# Patient Record
Sex: Male | Born: 1965 | Race: White | Hispanic: No | Smoking: Current every day smoker
Health system: Southern US, Community
[De-identification: ages and names within clinical notes are randomized; demographics above are authoritative.]

## PROBLEM LIST (undated history)

## (undated) ENCOUNTER — Emergency Department (HOSPITAL_COMMUNITY): Admission: EM

## (undated) DIAGNOSIS — K5900 Constipation, unspecified: Secondary | ICD-10-CM

## (undated) DIAGNOSIS — K759 Inflammatory liver disease, unspecified: Secondary | ICD-10-CM

## (undated) DIAGNOSIS — F1010 Alcohol abuse, uncomplicated: Secondary | ICD-10-CM

## (undated) DIAGNOSIS — K7460 Unspecified cirrhosis of liver: Secondary | ICD-10-CM

## (undated) DIAGNOSIS — F17200 Nicotine dependence, unspecified, uncomplicated: Secondary | ICD-10-CM

## (undated) DIAGNOSIS — K219 Gastro-esophageal reflux disease without esophagitis: Secondary | ICD-10-CM

## (undated) DIAGNOSIS — I10 Essential (primary) hypertension: Secondary | ICD-10-CM

## (undated) DIAGNOSIS — F1011 Alcohol abuse, in remission: Secondary | ICD-10-CM

## (undated) DIAGNOSIS — D649 Anemia, unspecified: Secondary | ICD-10-CM

## (undated) DIAGNOSIS — M009 Pyogenic arthritis, unspecified: Secondary | ICD-10-CM

## (undated) HISTORY — PX: CHOLECYSTECTOMY: SHX55

## (undated) HISTORY — PX: IR TIPS: IMG2295

---

## 2015-01-25 ENCOUNTER — Emergency Department (HOSPITAL_COMMUNITY)
Admission: EM | Admit: 2015-01-25 | Discharge: 2015-01-25 | Disposition: A | Discharge status: Home / Self Care | Payer: Medicaid Other | Attending: Emergency Medicine | Admitting: Emergency Medicine

## 2015-01-25 ENCOUNTER — Encounter (HOSPITAL_COMMUNITY): Payer: Self-pay

## 2015-01-25 ENCOUNTER — Emergency Department (HOSPITAL_COMMUNITY): Payer: Medicaid Other

## 2015-01-25 DIAGNOSIS — K7460 Unspecified cirrhosis of liver: Secondary | ICD-10-CM | POA: Diagnosis not present

## 2015-01-25 DIAGNOSIS — R7989 Other specified abnormal findings of blood chemistry: Secondary | ICD-10-CM | POA: Diagnosis not present

## 2015-01-25 DIAGNOSIS — F17200 Nicotine dependence, unspecified, uncomplicated: Secondary | ICD-10-CM | POA: Diagnosis not present

## 2015-01-25 DIAGNOSIS — R945 Abnormal results of liver function studies: Secondary | ICD-10-CM

## 2015-01-25 DIAGNOSIS — R109 Unspecified abdominal pain: Secondary | ICD-10-CM | POA: Diagnosis present

## 2015-01-25 LAB — URINALYSIS, ROUTINE W REFLEX MICROSCOPIC
BILIRUBIN URINE: NEGATIVE
Glucose, UA: NEGATIVE mg/dL
Ketones, ur: NEGATIVE mg/dL
Leukocytes, UA: NEGATIVE
Nitrite: NEGATIVE
PH: 6 (ref 5.0–8.0)
Protein, ur: NEGATIVE mg/dL
SPECIFIC GRAVITY, URINE: 1.022 (ref 1.005–1.030)

## 2015-01-25 LAB — COMPREHENSIVE METABOLIC PANEL
ALT: 158 U/L — AB (ref 17–63)
ANION GAP: 6 (ref 5–15)
AST: 176 U/L — AB (ref 15–41)
Albumin: 3.1 g/dL — ABNORMAL LOW (ref 3.5–5.0)
Alkaline Phosphatase: 166 U/L — ABNORMAL HIGH (ref 38–126)
BUN: 11 mg/dL (ref 6–20)
CALCIUM: 9 mg/dL (ref 8.9–10.3)
CHLORIDE: 105 mmol/L (ref 101–111)
CO2: 23 mmol/L (ref 22–32)
Creatinine, Ser: 0.84 mg/dL (ref 0.61–1.24)
GFR calc non Af Amer: >60 mL/min (ref >60)
GLUCOSE: 161 mg/dL — AB (ref 65–99)
POTASSIUM: 3.9 mmol/L (ref 3.5–5.1)
Sodium: 134 mmol/L — ABNORMAL LOW (ref 135–145)
Total Bilirubin: 1.9 mg/dL — ABNORMAL HIGH (ref 0.3–1.2)
Total Protein: 6.7 g/dL (ref 6.5–8.1)

## 2015-01-25 LAB — CBC
HEMATOCRIT: 40.9 % (ref 39.0–52.0)
HEMOGLOBIN: 14.5 g/dL (ref 13.0–17.0)
MCH: 34.1 pg — AB (ref 26.0–34.0)
MCHC: 35.5 g/dL (ref 30.0–36.0)
MCV: 96.2 fL (ref 78.0–100.0)
Platelets: 90 10*3/uL — ABNORMAL LOW (ref 150–400)
RBC: 4.25 MIL/uL (ref 4.22–5.81)
RDW: 14.4 % (ref 11.5–15.5)
WBC: 6.8 10*3/uL (ref 4.0–10.5)

## 2015-01-25 LAB — URINE MICROSCOPIC-ADD ON: WBC, UA: NONE SEEN WBC/hpf (ref 0–5)

## 2015-01-25 LAB — LIPASE, BLOOD: Lipase: 109 U/L — ABNORMAL HIGH (ref 11–51)

## 2015-01-25 MED ORDER — MORPHINE SULFATE (PF) 4 MG/ML IV SOLN
8.0000 mg | INTRAVENOUS | Status: DC | PRN
  Administered 2015-01-25 (×2): 8 mg via INTRAVENOUS
  Filled 2015-01-25 (×2): qty 2

## 2015-01-25 MED ORDER — ONDANSETRON HCL 4 MG/2ML IJ SOLN
4.0000 mg | Freq: Once | INTRAMUSCULAR | Status: AC
  Administered 2015-01-25: 4 mg via INTRAVENOUS
  Filled 2015-01-25: qty 2

## 2015-01-25 MED ORDER — IOHEXOL 300 MG/ML  SOLN
100.0000 mL | Freq: Once | INTRAMUSCULAR | Status: AC | PRN
  Administered 2015-01-25: 100 mL via INTRAVENOUS

## 2015-01-25 NOTE — ED Notes (Signed)
Pt ambulating independently w/ steady gait on d/c in no acute distress, A&Ox4.D/c instructions reviewed w/ pt and family - pt and family deny any further questions or concerns at present.  

## 2015-01-25 NOTE — ED Provider Notes (Signed)
CSN: [id number]     Arrival date & time 01/25/15  0156 History   By signing my name below, I, Jonathan Ibarra, attest that this documentation has been prepared under the direction and in the presence of Azalia Bilis, MD. Electronically Signed: Evon Ibarra, ED Scribe. 01/25/2015. 2:55 AM.    Chief Complaint  Patient presents with  . Abdominal Pain   The history is provided by the patient. No language interpreter was used.   HPI Comments: Jonathan Ibarra is a 49 y.o. male who presents to the Emergency Department complaining of intermittent  right side abdominal pain for the past 1 year. Pt states that over the last 3 days his pain has recently worsened. Pt states that the pain is radiating around to his right back. Pt states that he has had intermittent blood in his stool he states that when he wipe the tissue would be covered in blood. Pt states that he has been prescribed gabapentin for the pain but states that he has not been complaint with taking the medications for the past 2 days. Pt denies any other treatments tried for his symptoms. Pt states that he has an appointment at [facility] with a general surgeon next week. Pt denies dysuria, fever. Pt states that he has a Hx of  Abdominal hernias, cirrhosis of the liver, paracentesis and TIPS stent placed august 2016      Past Medical History  Diagnosis Date  . Cirrhosis (HCC)    No past surgical history on file. No family history on file. Social History  Substance Use Topics  . Smoking status: Current Every Day Smoker  . Smokeless tobacco: Not on file  . Alcohol Use: Yes    Review of Systems  Constitutional: Negative for fever.  Gastrointestinal: Positive for abdominal pain.  Genitourinary: Negative for dysuria.  All other systems reviewed and are negative.    Allergies  Review of patient's allergies indicates not on file.  Home Medications   Prior to Admission medications   Not on File   BP 136/72 mmHg  Pulse 106   Temp(Src) 97.9 F (36.6 C) (Oral)  Resp 22  Ht  (1.905 m)  Wt 244 lb (110.678 kg)  BMI 30.50 kg/m2  SpO2 98%   Physical Exam  Constitutional: He is oriented to person, place, and time. He appears well-developed and well-nourished.  HENT:  Head: Normocephalic and atraumatic.  Eyes: EOM are normal.  Neck: Normal range of motion.  Cardiovascular: Normal rate, regular rhythm, normal heart sounds and intact distal pulses.   Pulmonary/Chest: Effort normal and breath sounds normal. No respiratory distress.  Abdominal: Soft. He exhibits no distension. There is tenderness.  easily reducible umbilical hernia, mild right sided tenderness. Ascites present but not tight at this time  Musculoskeletal: Normal range of motion.  Neurological: He is alert and oriented to person, place, and time.  Skin: Skin is warm and dry.  Psychiatric: He has a normal mood and affect. Judgment normal.  Nursing note and vitals reviewed.   ED Course  Procedures (including critical care time) DIAGNOSTIC STUDIES: Oxygen Saturation is 98% on RA, normal by my interpretation.    COORDINATION OF CARE: 3:30 AM-Discussed treatment plan with pt at bedside and pt agreed to plan.     Labs Review Labs Reviewed  LIPASE, BLOOD - Abnormal; Notable for the following:    Lipase 109 (*)    All other components within normal limits  COMPREHENSIVE METABOLIC PANEL - Abnormal; Notable for the following:  Sodium 134 (*)    Glucose, Bld 161 (*)    Albumin 3.1 (*)    AST 176 (*)    ALT 158 (*)    Alkaline Phosphatase 166 (*)    Total Bilirubin 1.9 (*)    All other components within normal limits  CBC - Abnormal; Notable for the following:    MCH 34.1 (*)    Platelets 90 (*)    All other components within normal limits  URINALYSIS, ROUTINE W REFLEX MICROSCOPIC (NOT AT [facility]) - Abnormal; Notable for the following:    Color, Urine AMBER (*)    Hgb urine dipstick SMALL (*)    All other components within normal limits   URINE MICROSCOPIC-ADD ON - Abnormal; Notable for the following:    Squamous Epithelial / LPF 0-5 (*)    Bacteria, UA RARE (*)    All other components within normal limits    Imaging Review Ct Abdomen Pelvis W Contrast  01/25/2015  CLINICAL DATA:  Acute onset of mid to right lower quadrant abdominal pain. Initial encounter. EXAM: CT ABDOMEN AND PELVIS WITH CONTRAST TECHNIQUE: Multidetector CT imaging of the abdomen and pelvis was performed using the standard protocol following bolus administration of intravenous contrast. CONTRAST:  OMNIPAQUE IOHEXOL 300 MG/ML  SOLN COMPARISON:  CT of the abdomen and pelvis performed 06/29/2014 FINDINGS: A small right pleural effusion is noted, with mild bibasilar atelectasis. There is a diffusely nodular contour to the liver, compatible with hepatic cirrhosis. The spleen is enlarged, measuring 19.4 cm in length. The TIPS appears grossly patent, though difficult to fully assess given the phase of contrast enhancement. Mild gastric varices are noted. The patient is status post cholecystectomy, with clips noted at the gallbladder fossa. Trace ascites is noted about the liver. The pancreas and adrenal glands are unremarkable. The kidneys are unremarkable in appearance. There is no evidence of hydronephrosis. No renal or ureteral stones are seen. No perinephric stranding is appreciated. No free fluid is identified. The small bowel is unremarkable in appearance. The stomach is within normal limits. No acute vascular abnormalities are seen. The appendix is normal in caliber and contains air, without evidence of appendicitis. The colon is unremarkable in appearance. Mild nonspecific soft tissue inflammation is noted adjacent to the cecum, possibly reflecting a small omental infarct. A small umbilical hernia is seen, containing only fat, with associated soft tissue inflammation. The bladder is mildly distended and grossly unremarkable. A small urachal remnant is incidentally  seen. The prostate remains normal in size. No inguinal lymphadenopathy is seen. No acute osseous abnormalities are identified. IMPRESSION: 1. Mild nonspecific soft tissue inflammation adjacent to the cecum, possibly reflecting a small omental infarct. 2. Small umbilical hernia, containing only fat, with associated soft tissue inflammation. 3. Findings of hepatic cirrhosis. A TIPS appears grossly patent, though difficult to fully assess given the phase of contrast enhancement. Mild gastric varices noted. 4. Splenomegaly noted. 5. Small right pleural effusion, with mild bibasilar atelectasis. 6. Trace ascites noted about the liver. Electronically Signed   By: Roanna Raider M.D.   On: 01/25/2015 05:35  I personally reviewed the imaging tests through PACS system I reviewed available ER/hospitalization records through the EMR     EKG Interpretation None      MDM   Final diagnoses:  None    Baseline liver function tests for the patient as compared to the [facility] labs reviewed in the electronic medical record.  CT scan without acute pathology.  Pain improving.  Long-standing history of recurrent abdominal pain.  Patient is seen in multiple different hospital systems.  Patient be referred back to his primary team at [facility].  Patient be discharged home at this time.  The patient will not be sent home with any of your medications from the emergency department.  Of told the patient that these medications are best delivered through a primary care physician   I personally performed the services described in this documentation, which was scribed in my presence. The recorded information has been reviewed and is accurate.         Azalia BilisKevin Travia Onstad, MD 01/25/15 [phone]

## 2015-01-25 NOTE — ED Notes (Signed)
Patient presents with c/o abd pain for the past 3 days.  Abd swollen - states he has 3 hernias and [facility].  No vomiting at this time

## 2015-01-25 NOTE — ED Notes (Signed)
Patient c/o nausea 

## 2015-01-25 NOTE — ED Notes (Signed)
Sclera not yellow but skin has a yellow tinge.

## 2015-01-25 NOTE — ED Notes (Signed)
Pt here for abd pain, hx of same, has appt with surgeon and liver doctor at [facility] this coming week

## 2015-02-04 ENCOUNTER — Emergency Department (HOSPITAL_COMMUNITY): Payer: Medicaid Other

## 2015-02-04 ENCOUNTER — Encounter (HOSPITAL_COMMUNITY): Payer: Self-pay | Admitting: Emergency Medicine

## 2015-02-04 ENCOUNTER — Inpatient Hospital Stay (HOSPITAL_COMMUNITY)
Admission: EM | Admit: 2015-02-04 | Discharge: 2015-02-07 | DRG: 443 | Disposition: A | Discharge status: Home / Self Care | Payer: Medicaid Other | Attending: Internal Medicine | Admitting: Internal Medicine

## 2015-02-04 DIAGNOSIS — B1920 Unspecified viral hepatitis C without hepatic coma: Secondary | ICD-10-CM | POA: Diagnosis present

## 2015-02-04 DIAGNOSIS — Z9114 Patient's other noncompliance with medication regimen: Secondary | ICD-10-CM

## 2015-02-04 DIAGNOSIS — D696 Thrombocytopenia, unspecified: Secondary | ICD-10-CM | POA: Diagnosis present

## 2015-02-04 DIAGNOSIS — K7290 Hepatic failure, unspecified without coma: Secondary | ICD-10-CM

## 2015-02-04 DIAGNOSIS — K7210 Chronic hepatic failure without coma: Secondary | ICD-10-CM | POA: Diagnosis not present

## 2015-02-04 DIAGNOSIS — K5900 Constipation, unspecified: Secondary | ICD-10-CM | POA: Diagnosis present

## 2015-02-04 DIAGNOSIS — R4182 Altered mental status, unspecified: Secondary | ICD-10-CM | POA: Diagnosis not present

## 2015-02-04 DIAGNOSIS — F1290 Cannabis use, unspecified, uncomplicated: Secondary | ICD-10-CM | POA: Diagnosis present

## 2015-02-04 DIAGNOSIS — K429 Umbilical hernia without obstruction or gangrene: Secondary | ICD-10-CM | POA: Diagnosis present

## 2015-02-04 DIAGNOSIS — K7460 Unspecified cirrhosis of liver: Secondary | ICD-10-CM | POA: Diagnosis present

## 2015-02-04 DIAGNOSIS — R740 Nonspecific elevation of levels of transaminase and lactic acid dehydrogenase [LDH]: Secondary | ICD-10-CM | POA: Diagnosis present

## 2015-02-04 DIAGNOSIS — I10 Essential (primary) hypertension: Secondary | ICD-10-CM | POA: Diagnosis present

## 2015-02-04 DIAGNOSIS — E876 Hypokalemia: Secondary | ICD-10-CM | POA: Diagnosis present

## 2015-02-04 DIAGNOSIS — Z79899 Other long term (current) drug therapy: Secondary | ICD-10-CM

## 2015-02-04 DIAGNOSIS — F17200 Nicotine dependence, unspecified, uncomplicated: Secondary | ICD-10-CM | POA: Diagnosis present

## 2015-02-04 DIAGNOSIS — Z95828 Presence of other vascular implants and grafts: Secondary | ICD-10-CM

## 2015-02-04 DIAGNOSIS — K7682 Hepatic encephalopathy: Secondary | ICD-10-CM | POA: Diagnosis present

## 2015-02-04 DIAGNOSIS — F1910 Other psychoactive substance abuse, uncomplicated: Secondary | ICD-10-CM | POA: Diagnosis present

## 2015-02-04 DIAGNOSIS — R7401 Elevation of levels of liver transaminase levels: Secondary | ICD-10-CM | POA: Diagnosis present

## 2015-02-04 HISTORY — DX: Alcohol abuse, uncomplicated: F10.10

## 2015-02-04 HISTORY — DX: Unspecified cirrhosis of liver: K74.60

## 2015-02-04 HISTORY — DX: Essential (primary) hypertension: I10

## 2015-02-04 LAB — CBC WITH DIFFERENTIAL/PLATELET
BASOS PCT: 1 %
Basophils Absolute: 0 10*3/uL (ref 0.0–0.1)
Basophils Absolute: 0 10*3/uL (ref 0.0–0.1)
Basophils Relative: 1 %
EOS ABS: 0.1 10*3/uL (ref 0.0–0.7)
EOS ABS: 0.1 10*3/uL (ref 0.0–0.7)
EOS PCT: 3 %
Eosinophils Relative: 2 %
HCT: 39.2 % (ref 39.0–52.0)
HEMATOCRIT: 41.5 % (ref 39.0–52.0)
Hemoglobin: 14.2 g/dL (ref 13.0–17.0)
Hemoglobin: 13.5 g/dL (ref 13.0–17.0)
LYMPHS ABS: 0.7 10*3/uL (ref 0.7–4.0)
LYMPHS ABS: 0.7 10*3/uL (ref 0.7–4.0)
Lymphocytes Relative: 15 %
Lymphocytes Relative: 15 %
MCH: 33.3 pg (ref 26.0–34.0)
MCH: 33.8 pg (ref 26.0–34.0)
MCHC: 34.2 g/dL (ref 30.0–36.0)
MCHC: 34.4 g/dL (ref 30.0–36.0)
MCV: 97.2 fL (ref 78.0–100.0)
MCV: 98 fL (ref 78.0–100.0)
MONO ABS: 0.5 10*3/uL (ref 0.1–1.0)
MONO ABS: 0.7 10*3/uL (ref 0.1–1.0)
MONOS PCT: 11 %
Monocytes Relative: 15 %
Neutro Abs: 3.3 10*3/uL (ref 1.7–7.7)
Neutro Abs: 3.2 10*3/uL (ref 1.7–7.7)
Neutrophils Relative %: 72 %
Neutrophils Relative %: 68 %
PLATELETS: 87 10*3/uL — AB (ref 150–400)
Platelets: 88 10*3/uL — ABNORMAL LOW (ref 150–400)
RBC: 4.27 MIL/uL (ref 4.22–5.81)
RBC: 4 MIL/uL — AB (ref 4.22–5.81)
RDW: 14.5 % (ref 11.5–15.5)
RDW: 14.4 % (ref 11.5–15.5)
WBC: 4.5 10*3/uL (ref 4.0–10.5)
WBC: 4.8 10*3/uL (ref 4.0–10.5)

## 2015-02-04 LAB — APTT
APTT: 31 s (ref 24–37)
APTT: 33 s (ref 24–37)

## 2015-02-04 LAB — COMPREHENSIVE METABOLIC PANEL
ALBUMIN: 3.1 g/dL — AB (ref 3.5–5.0)
ALK PHOS: 197 U/L — AB (ref 38–126)
ALK PHOS: 181 U/L — AB (ref 38–126)
ALT: 205 U/L — AB (ref 17–63)
ALT: 199 U/L — AB (ref 17–63)
ANION GAP: 5 (ref 5–15)
AST: 200 U/L — AB (ref 15–41)
AST: 198 U/L — ABNORMAL HIGH (ref 15–41)
Albumin: 3 g/dL — ABNORMAL LOW (ref 3.5–5.0)
Anion gap: 7 (ref 5–15)
BILIRUBIN TOTAL: 1.8 mg/dL — AB (ref 0.3–1.2)
BUN: 11 mg/dL (ref 6–20)
BUN: 11 mg/dL (ref 6–20)
CALCIUM: 9.2 mg/dL (ref 8.9–10.3)
CALCIUM: 9 mg/dL (ref 8.9–10.3)
CHLORIDE: 107 mmol/L (ref 101–111)
CO2: 23 mmol/L (ref 22–32)
CO2: 27 mmol/L (ref 22–32)
CREATININE: 0.7 mg/dL (ref 0.61–1.24)
CREATININE: 0.83 mg/dL (ref 0.61–1.24)
Chloride: 107 mmol/L (ref 101–111)
GFR calc Af Amer: >60 mL/min (ref >60)
GFR calc non Af Amer: >60 mL/min (ref >60)
GFR calc non Af Amer: >60 mL/min (ref >60)
GLUCOSE: 122 mg/dL — AB (ref 65–99)
GLUCOSE: 106 mg/dL — AB (ref 65–99)
Potassium: 4.6 mmol/L (ref 3.5–5.1)
Potassium: 4.4 mmol/L (ref 3.5–5.1)
SODIUM: 137 mmol/L (ref 135–145)
SODIUM: 139 mmol/L (ref 135–145)
TOTAL PROTEIN: 6.8 g/dL (ref 6.5–8.1)
Total Bilirubin: 1.6 mg/dL — ABNORMAL HIGH (ref 0.3–1.2)
Total Protein: 7.1 g/dL (ref 6.5–8.1)

## 2015-02-04 LAB — SALICYLATE LEVEL: Salicylate Lvl: <4 mg/dL (ref 2.8–30.0)

## 2015-02-04 LAB — URINALYSIS, ROUTINE W REFLEX MICROSCOPIC
Bilirubin Urine: NEGATIVE
GLUCOSE, UA: NEGATIVE mg/dL
Hgb urine dipstick: NEGATIVE
KETONES UR: NEGATIVE mg/dL
LEUKOCYTES UA: NEGATIVE
Nitrite: NEGATIVE
PH: 7.5 (ref 5.0–8.0)
Protein, ur: NEGATIVE mg/dL
SPECIFIC GRAVITY, URINE: 1.018 (ref 1.005–1.030)

## 2015-02-04 LAB — BLOOD GAS, ARTERIAL
ACID-BASE DEFICIT: 0.7 mmol/L (ref 0.0–2.0)
Bicarbonate: 22.4 mEq/L (ref 20.0–24.0)
DRAWN BY: 295031
FIO2: 0.21
O2 Saturation: 90.3 %
PATIENT TEMPERATURE: 98.6
PCO2 ART: 34 mmHg — AB (ref 35.0–45.0)
PH ART: 7.435 (ref 7.350–7.450)
TCO2: 19.7 mmol/L (ref 0–100)
pO2, Arterial: 57.9 mmHg — ABNORMAL LOW (ref 80.0–100.0)

## 2015-02-04 LAB — TYPE AND SCREEN
ABO/RH(D): B POS
ANTIBODY SCREEN: NEGATIVE

## 2015-02-04 LAB — RAPID URINE DRUG SCREEN, HOSP PERFORMED
Amphetamines: NOT DETECTED
BARBITURATES: NOT DETECTED
BENZODIAZEPINES: NOT DETECTED
COCAINE: NOT DETECTED
OPIATES: NOT DETECTED
Tetrahydrocannabinol: POSITIVE — AB

## 2015-02-04 LAB — PHOSPHORUS: PHOSPHORUS: 3.1 mg/dL (ref 2.5–4.6)

## 2015-02-04 LAB — PROTIME-INR
INR: 1.21 (ref 0.00–1.49)
INR: 1.26 (ref 0.00–1.49)
Prothrombin Time: 15.5 seconds — ABNORMAL HIGH (ref 11.6–15.2)
Prothrombin Time: 15.9 seconds — ABNORMAL HIGH (ref 11.6–15.2)

## 2015-02-04 LAB — MRSA PCR SCREENING: MRSA BY PCR: NEGATIVE

## 2015-02-04 LAB — TSH: TSH: 2.855 u[IU]/mL (ref 0.350–4.500)

## 2015-02-04 LAB — ETHANOL: Alcohol, Ethyl (B): <5 mg/dL (ref <5)

## 2015-02-04 LAB — MAGNESIUM: Magnesium: 1.9 mg/dL (ref 1.7–2.4)

## 2015-02-04 LAB — ABO/RH: ABO/RH(D): B POS

## 2015-02-04 LAB — ACETAMINOPHEN LEVEL

## 2015-02-04 LAB — AMMONIA: Ammonia: 214 umol/L — ABNORMAL HIGH (ref 9–35)

## 2015-02-04 MED ORDER — ONDANSETRON HCL 4 MG/2ML IJ SOLN: 4.0000 mg | Freq: Four times a day (QID) | INTRAMUSCULAR | Status: DC | PRN

## 2015-02-04 MED ORDER — GABAPENTIN 100 MG PO CAPS: 100.0000 mg | ORAL_CAPSULE | Freq: Three times a day (TID) | ORAL | Status: DC

## 2015-02-04 MED ORDER — LORAZEPAM 2 MG/ML IJ SOLN
0.0000 mg | Freq: Four times a day (QID) | INTRAMUSCULAR | Status: AC
Start: 2015-02-04 — End: 2015-02-06
  Administered 2015-02-04 – 2015-02-06 (×5): 2 mg via INTRAVENOUS
  Filled 2015-02-04 (×7): qty 1

## 2015-02-04 MED ORDER — FUROSEMIDE 40 MG PO TABS
40.0000 mg | ORAL_TABLET | Freq: Two times a day (BID) | ORAL | Status: DC
  Filled 2015-02-04: qty 1

## 2015-02-04 MED ORDER — LORAZEPAM 2 MG/ML IJ SOLN
INTRAMUSCULAR | Status: AC
  Filled 2015-02-04: qty 1

## 2015-02-04 MED ORDER — FOLIC ACID 1 MG PO TABS
1.0000 mg | ORAL_TABLET | Freq: Every day | ORAL | Status: DC
  Administered 2015-02-05 – 2015-02-07 (×3): 1 mg via ORAL
  Filled 2015-02-04 (×3): qty 1

## 2015-02-04 MED ORDER — SODIUM CHLORIDE 0.9 % IV SOLN
INTRAVENOUS | Status: DC
  Administered 2015-02-04: 18:00:00 via INTRAVENOUS

## 2015-02-04 MED ORDER — LACTULOSE 10 GM/15ML PO SOLN
30.0000 g | Freq: Once | ORAL | Status: DC
  Filled 2015-02-04: qty 45

## 2015-02-04 MED ORDER — LORAZEPAM 2 MG/ML IJ SOLN
1.0000 mg | Freq: Four times a day (QID) | INTRAMUSCULAR | Status: DC | PRN
Start: 2015-02-04 — End: 2015-02-04
  Administered 2015-02-04: 1 mg via INTRAVENOUS

## 2015-02-04 MED ORDER — FUROSEMIDE 10 MG/ML IJ SOLN
40.0000 mg | Freq: Every day | INTRAMUSCULAR | Status: DC
  Administered 2015-02-04 – 2015-02-05 (×2): 40 mg via INTRAVENOUS
  Filled 2015-02-04: qty 4

## 2015-02-04 MED ORDER — THIAMINE HCL 100 MG/ML IJ SOLN
100.0000 mg | Freq: Every day | INTRAMUSCULAR | Status: DC
  Administered 2015-02-04 – 2015-02-05 (×2): 100 mg via INTRAVENOUS
  Filled 2015-02-04: qty 1
  Filled 2015-02-04 (×2): qty 2

## 2015-02-04 MED ORDER — PANTOPRAZOLE SODIUM 40 MG IV SOLR
40.0000 mg | INTRAVENOUS | Status: DC
  Administered 2015-02-04: 40 mg via INTRAVENOUS
  Filled 2015-02-04 (×3): qty 40

## 2015-02-04 MED ORDER — SPIRONOLACTONE 25 MG PO TABS: 100.0000 mg | ORAL_TABLET | Freq: Every day | ORAL | Status: DC

## 2015-02-04 MED ORDER — LACTULOSE 10 GM/15ML PO SOLN: 20.0000 g | Freq: Three times a day (TID) | ORAL | Status: DC

## 2015-02-04 MED ORDER — FUROSEMIDE 10 MG/ML IJ SOLN
INTRAMUSCULAR | Status: AC
  Filled 2015-02-04: qty 4

## 2015-02-04 MED ORDER — LORAZEPAM 2 MG/ML IJ SOLN
1.0000 mg | Freq: Four times a day (QID) | INTRAMUSCULAR | Status: DC | PRN
  Administered 2015-02-05 (×2): 1 mg via INTRAVENOUS

## 2015-02-04 MED ORDER — ADULT MULTIVITAMIN W/MINERALS CH
1.0000 | ORAL_TABLET | Freq: Every day | ORAL | Status: DC
  Administered 2015-02-05 – 2015-02-07 (×3): 1 via ORAL
  Filled 2015-02-04 (×3): qty 1

## 2015-02-04 MED ORDER — ONDANSETRON HCL 4 MG PO TABS: 4.0000 mg | ORAL_TABLET | Freq: Four times a day (QID) | ORAL | Status: DC | PRN

## 2015-02-04 MED ORDER — PANTOPRAZOLE SODIUM 40 MG PO TBEC: 40.0000 mg | DELAYED_RELEASE_TABLET | Freq: Two times a day (BID) | ORAL | Status: DC

## 2015-02-04 MED ORDER — LORAZEPAM 2 MG/ML IJ SOLN: 0.0000 mg | Freq: Two times a day (BID) | INTRAMUSCULAR | Status: DC

## 2015-02-04 MED ORDER — LORAZEPAM 1 MG PO TABS: 1.0000 mg | ORAL_TABLET | Freq: Four times a day (QID) | ORAL | Status: DC | PRN

## 2015-02-04 MED ORDER — SODIUM CHLORIDE 0.9 % IV SOLN
1000.0000 mL | INTRAVENOUS | Status: DC
  Administered 2015-02-04: 1000 mL via INTRAVENOUS

## 2015-02-04 MED ORDER — VITAMIN B-1 100 MG PO TABS
100.0000 mg | ORAL_TABLET | Freq: Every day | ORAL | Status: DC
  Administered 2015-02-06 – 2015-02-07 (×2): 100 mg via ORAL
  Filled 2015-02-04 (×2): qty 1

## 2015-02-04 MED ORDER — SODIUM CHLORIDE 0.9 % IJ SOLN
3.0000 mL | Freq: Two times a day (BID) | INTRAMUSCULAR | Status: DC
  Administered 2015-02-05 (×2): 3 mL via INTRAVENOUS

## 2015-02-04 NOTE — ED Notes (Signed)
Per EMS-states he is staying with friends till apartment is ready-states he has been on the toilet for 2 hours-unable to follow EMS directions-got up from toilet and laid in bed-CBG 124-was given Haldol 5 mg on seen-given an addition 5 mg in route due to aggression-unable to answer questions, history of cirrhosis-placed in restraints upon arrival to [facility]

## 2015-02-04 NOTE — ED Provider Notes (Signed)
CSN: [id number]     Arrival date & time 02/04/15  1212 History   First MD Initiated Contact with Patient 02/04/15 1218     Chief Complaint  Patient presents with  . AMS    level V caveat: Altered mental status HPI Patient presents to the emergency room with altered mental status.  The patient was staying at a friend's apartment. According to the EMS report they were called because the patient was sitting on the toilet for approximately 2 hours. He was confused. When EMS arrived patient was agitated in was not compliant with their directions. Eventually after a period of time and able to get him to lay down in bed. His CBG was checked and was 124. He was given Haldol 5 mg, twice for his agitation and aggression.  Patient is confused in the emergency room. He is mumbling and not answering questions properly. Additional history is obtained from the medical records. The patient was recently in the emergency room on November 26 with complaints of abdominal pain. The patient was evaluated with an abdominal CAT scan. Notes indicate the patient was asking for pain medications but discharged home without any prescriptions. Past Medical History  Diagnosis Date  . Cirrhosis (HCC)   . Hypertension   . Hepatitis    History reviewed. No pertinent past surgical history. No family history on file. Social History  Substance Use Topics  . Smoking status: Current Every Day Smoker  . Smokeless tobacco: None  . Alcohol Use: Yes    Review of Systems  Unable to perform ROS: Acuity of condition      Allergies  Tylenol  Home Medications   Prior to Admission medications   Medication Sig Start Date End Date Taking? Authorizing Provider  furosemide (LASIX) 40 MG tablet Take 40 mg by mouth 2 (two) times daily.    Historical Provider, MD  gabapentin (NEURONTIN) 100 MG capsule Take 100 mg by mouth 3 (three) times daily.    Historical Provider, MD  lactulose (CHRONULAC) 10 GM/15ML solution Take 20 g by mouth 3  (three) times daily.    Historical Provider, MD  pantoprazole (PROTONIX) 40 MG tablet Take 40 mg by mouth 2 (two) times daily.    Historical Provider, MD  spironolactone (ALDACTONE) 100 MG tablet Take 100 mg by mouth daily.    Historical Provider, MD   BP 168/115 mmHg  Pulse 93  Resp 23  SpO2 98% Physical Exam  Constitutional: He appears well-developed and well-nourished.  HENT:  Head: Normocephalic and atraumatic.  Right Ear: External ear normal.  Left Ear: External ear normal.  Mouth/Throat: No oropharyngeal exudate (mucous membranes are dry).  Pupils are 3 mm and reactive bilaterally  Eyes: Conjunctivae are normal. Right eye exhibits no discharge. Left eye exhibits no discharge. No scleral icterus.  Neck: Neck supple. No tracheal deviation present.  Cardiovascular: Regular rhythm and intact distal pulses.  Tachycardia present.   Pulmonary/Chest: Effort normal and breath sounds normal. No stridor. No respiratory distress. He has no wheezes. He has no rales.  Abdominal: Soft. Bowel sounds are normal. He exhibits no distension. There is no tenderness. There is no rebound and no guarding.  Soft periumbilical hernia without induration or signs of incarceration  Musculoskeletal: He exhibits no edema or tenderness.  Neurological: He is alert. He is disoriented. He displays tremor. No cranial nerve deficit (no facial droop, extraocular movements intact, no slurred speech) or sensory deficit. He exhibits normal muscle tone. He displays no seizure activity. GCS eye  subscore is 3. GCS verbal subscore is 3. GCS motor subscore is 5.  Patient does not follow commands, he speaks and mumbles in response to questions, patient moves all 4 extremities, he attempts to sit up in bed, he pulls away when I attempt to examine him  Skin: Skin is warm and dry. No rash noted.  Psychiatric: He has a normal mood and affect. He is agitated and combative.  Nursing note and vitals reviewed.   ED Course  Procedures  (including critical care time)  CRITICAL CARE Performed by: ZOXWR,UEAKNAPP,Shardee Dieu Total critical care time: 35 minutes Critical care time was exclusive of separately billable procedures and treating other patients. Critical care was necessary to treat or prevent imminent or life-threatening deterioration. Critical care was time spent personally by me on the following activities: development of treatment plan with patient and/or surrogate as well as nursing, discussions with consultants, evaluation of patient's response to treatment, examination of patient, obtaining history from patient or surrogate, ordering and performing treatments and interventions, ordering and review of laboratory studies, ordering and review of radiographic studies, pulse oximetry and re-evaluation of patient's condition.  Labs Review Labs Reviewed  COMPREHENSIVE METABOLIC PANEL - Abnormal; Notable for the following:    Glucose, Bld 122 (*)    Albumin 3.1 (*)    AST 200 (*)    ALT 205 (*)    Alkaline Phosphatase 197 (*)    Total Bilirubin 1.6 (*)    All other components within normal limits  CBC WITH DIFFERENTIAL/PLATELET - Abnormal; Notable for the following:    Platelets 88 (*)    All other components within normal limits  PROTIME-INR - Abnormal; Notable for the following:    Prothrombin Time 15.5 (*)    All other components within normal limits  AMMONIA - Abnormal; Notable for the following:    Ammonia 214 (*)    All other components within normal limits  BLOOD GAS, ARTERIAL - Abnormal; Notable for the following:    pCO2 arterial 34.0 (*)    pO2, Arterial 57.9 (*)    All other components within normal limits  URINE RAPID DRUG SCREEN, HOSP PERFORMED - Abnormal; Notable for the following:    Tetrahydrocannabinol POSITIVE (*)    All other components within normal limits  ACETAMINOPHEN LEVEL - Abnormal; Notable for the following:    Acetaminophen (Tylenol), Serum <10 (*)    All other components within normal limits   CULTURE, BLOOD (ROUTINE X 2)  CULTURE, BLOOD (ROUTINE X 2)  APTT  ETHANOL  SALICYLATE LEVEL  URINALYSIS, ROUTINE W REFLEX MICROSCOPIC (NOT AT [facility])  TYPE AND SCREEN  ABO/RH    Imaging Review Dg Chest 1 View  02/04/2015  CLINICAL DATA:  49 year old male with altered mental status, combative. Initial encounter. EXAM: CHEST 1 VIEW COMPARISON:  05/11/2014. FINDINGS: AP supine view of the chest 1246 hours. Large body habitus. Continued low lung volumes. Otherwise allowing for portable technique the lungs are clear. Normal cardiac size and mediastinal contours. No pneumothorax. IMPRESSION: No acute cardiopulmonary abnormality. Electronically Signed   By: Odessa FlemingH  Hall M.D.   On: 02/04/2015 13:18   Ct Head Wo Contrast  02/04/2015  CLINICAL DATA:  Altered mental status EXAM: CT HEAD WITHOUT CONTRAST TECHNIQUE: Contiguous axial images were obtained from the base of the skull through the vertex without intravenous contrast. COMPARISON:  None. FINDINGS: Skull and Sinuses:Negative for fracture or destructive process. The visualized mastoids, middle ears, and imaged paranasal sinuses are clear. Orbits: No acute abnormality. Brain: Normal.  No evidence of acute infarction, hemorrhage, hydrocephalus, or mass lesion/mass effect. IMPRESSION: Normal head CT. Electronically Signed   By: Marnee Spring M.D.   On: 02/04/2015 12:57   I have personally reviewed and evaluated these images and lab results as part of my medical decision-making.   EKG Interpretation   Date/Time:  Tuesday February 04 2015 12:19:33 EST Ventricular Rate:  103 PR Interval:  122 QRS Duration: 97 QT Interval:  363 QTC Calculation: 475 R Axis:   -61 Text Interpretation:  Sinus tachycardia Left anterior fascicular block  Abnormal R-wave progression, early transition Probable left ventricular  hypertrophy Borderline prolonged QT interval No previous tracing Confirmed  by Whittaker Lenis  MD-J, Skyann Ganim ([id number]) on 02/04/2015 12:49:22 PM      MDM    Final diagnoses:  Hepatic encephalopathy (HCC)    1226  patient presents to the emergency room acutely confused. Differential includes possible drug ingestion, hepatic encephalopathy, acute infection, or acute CNS event.  We'll plan with laboratory testing CT scan of the brain and further evaluation. he has been placed in restraints at this time for his safety.  1437  Patient's laboratory tests and CT scan has been reviewed.  CT scan does not show any evidence of acute abnormality. Patient's laboratory tests are notable for chronic liver failure and an elevated ammonia level. I suspect his symptoms are related to hepatic encephalopathy.  The patient has remained stable. Plan on admission to the hospital for further treatment and stabilization.    Linwood Dibbles, MD 02/04/15 [phone]

## 2015-02-04 NOTE — ED Notes (Signed)
Report given-transfer to 5 th floor 

## 2015-02-04 NOTE — H&P (Signed)
[facility] History and Physical  Jonathan CanterburyGary Sheldon Ibarra ZOX:[id number] DOB: 30-Dec-1965 DOA: 02/04/2015  Referring physician: ER physician: Dr. Linwood DibblesJon Ibarra  [facility]  Chief Complaint: altered mental status   HPI:  49 year old male with past medical history of liver cirrhosis (not compliant with meds), hypertension who [facility] for evaluation of altered mental status. He is not able to provide details of medical history due to AMS. He was apparently sitting on the toilet at home, confused. He was more agitated when EMS arrived. No respiratory distress. No vomiting. No reports of bleeding.   In ED, BP was 168/115, HR was 126, RR 12-26. Pt was afebrile. Blood work was notable for platelets of 88. Ammonia level was 214. Tylenol level was WNL. UDS showed THC in urine. He was very restless in ED and did not improve with haldol. He is admitted to SDU due to agitation and possible need for precedex.   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 Electronically Signed   By: Jonathan FlemingH  Hall M.D.   On: 02/04/2015 13:18   Ct Head Wo Contrast 02/04/2015  Normal head CT. Electronically Signed   By:  Jonathan SpringJonathon  Watts M.D.   On: 02/04/2015 12:57    Code Status: Full Family Communication: Family not at the bedside  Disposition Plan: Admit for further evaluation, SDU due to AMS and possible need for precedex   Manson PasseyEVINE, Jonathan Dieter, MD  [facility] Pager [phone]  Time spent in minutes: 75 minutes  Review of Systems:  Unable to obtain due to patient's mental status      History reviewed. No pertinent past medical history. History reviewed. No pertinent past surgical history. Social History:  reports that he has been smoking.  He does not have any smokeless tobacco history on file. He reports that he drinks alcohol. He reports that he does not use illicit drugs.  Allergies  Allergen Reactions  . Tylenol [Acetaminophen] Other (See Comments)    "Not good for liver"    Family History: Unable to obtain due to patient's altered mental status    Prior to Admission medications   Medication Sig Start Date End Date Taking? Authorizing Provider  Lactulose Encephalopathy (GENERLAC PO) Take by mouth.   Yes Historical Provider, MD  furosemide (LASIX) 40 MG tablet Take 40 mg by mouth 2 (two) times daily.    Historical Provider, MD  gabapentin (NEURONTIN) 100 MG capsule Take 100 mg by mouth 3 (three) times daily.    Historical Provider, MD  lactulose (CHRONULAC) 10 GM/15ML solution Take 20 g by mouth 3 (three) times daily.  Historical Provider, MD  pantoprazole (PROTONIX) 40 MG tablet Take 40 mg by mouth 2 (two) times daily.    Historical Provider, MD  pregabalin (LYRICA) 50 MG capsule Take 50 mg by mouth 3 (three) times daily.    Historical Provider, MD  spironolactone (ALDACTONE) 100 MG tablet Take 100 mg by mouth daily.    Historical Provider, MD   Physical Exam: Filed Vitals:   02/04/15 1600 02/04/15 1614 02/04/15 1650 02/04/15 1727  BP: 119/68 119/68 116/69   Pulse:  85 126   Temp:    97.5 F (36.4 C)  TempSrc:    Axillary  Resp:  12 25   SpO2:  98% 95%     Physical Exam   Constitutional: Appears well-developed and well-nourished. No distress.  HENT: Normocephalic. No tonsillar erythema or exudates Eyes: Conjunctivae are normal.No scleral icterus.  Neck: Normal ROM. Neck supple. No JVD. No tracheal deviation. No thyromegaly.  CVS: tachycardic, S1/S2 +, no murmurs, no gallops, no carotid bruit.  Pulmonary: Effort and breath sounds normal, no stridor, rhonchi, wheezes, rales.  Abdominal: Soft. BS +,  no distension, tenderness, rebound or guarding. Umbilical hernia (+) Musculoskeletal: Normal range of motion. No edema and no tenderness.  Lymphadenopathy: No lymphadenopathy noted, cervical, inguinal. Neuro: Normal reflexes, muscle tone coordination. No focal neurologic deficits. Skin: Skin is warm and dry. No rash noted.  No erythema. No pallor.  Psychiatric: unable to assess due to altered mental status  Labs on Admission:  Basic Metabolic Panel:  Recent Labs Lab 02/04/15 1230  NA 137  K 4.6  CL 107  CO2 23  GLUCOSE 122*  BUN 11  CREATININE 0.70  CALCIUM 9.2   Liver Function Tests:  Recent Labs Lab 02/04/15 1230  AST 200*  ALT 205*  ALKPHOS 197*  BILITOT 1.6*  PROT 7.1  ALBUMIN 3.1*   No results for input(s): LIPASE, AMYLASE in the last 168 hours.  Recent Labs Lab 02/04/15 1230  AMMONIA 214*   CBC:  Recent Labs Lab 02/04/15 1230  WBC 4.5  NEUTROABS 3.3  HGB 14.2  HCT 41.5  MCV 97.2  PLT 88*   Cardiac Enzymes: No results for input(s): CKTOTAL, CKMB, CKMBINDEX, TROPONINI in the last 168 hours. BNP: Invalid input(s): POCBNP CBG: No results for input(s): GLUCAP in the last 168 hours.  If 7PM-7AM, please contact night-coverage [email or web address] Password [facility] 02/04/2015, 6:19 PM

## 2015-02-04 NOTE — ED Notes (Signed)
Off floor for testing 

## 2015-02-05 DIAGNOSIS — D696 Thrombocytopenia, unspecified: Secondary | ICD-10-CM

## 2015-02-05 DIAGNOSIS — I10 Essential (primary) hypertension: Secondary | ICD-10-CM

## 2015-02-05 DIAGNOSIS — K7290 Hepatic failure, unspecified without coma: Secondary | ICD-10-CM

## 2015-02-05 DIAGNOSIS — K7460 Unspecified cirrhosis of liver: Secondary | ICD-10-CM

## 2015-02-05 LAB — CBC
HEMATOCRIT: 39.3 % (ref 39.0–52.0)
HEMOGLOBIN: 13.4 g/dL (ref 13.0–17.0)
MCH: 33.3 pg (ref 26.0–34.0)
MCHC: 34.1 g/dL (ref 30.0–36.0)
MCV: 97.8 fL (ref 78.0–100.0)
PLATELETS: 89 10*3/uL — AB (ref 150–400)
RBC: 4.02 MIL/uL — AB (ref 4.22–5.81)
RDW: 14.4 % (ref 11.5–15.5)
WBC: 4.8 10*3/uL (ref 4.0–10.5)

## 2015-02-05 LAB — BASIC METABOLIC PANEL
Anion gap: 6 (ref 5–15)
BUN: 12 mg/dL (ref 6–20)
CHLORIDE: 107 mmol/L (ref 101–111)
CO2: 25 mmol/L (ref 22–32)
CREATININE: 0.68 mg/dL (ref 0.61–1.24)
Calcium: 9.1 mg/dL (ref 8.9–10.3)
GFR calc non Af Amer: >60 mL/min (ref >60)
Glucose, Bld: 97 mg/dL (ref 65–99)
POTASSIUM: 4.2 mmol/L (ref 3.5–5.1)
Sodium: 138 mmol/L (ref 135–145)

## 2015-02-05 LAB — GLUCOSE, CAPILLARY: Glucose-Capillary: 97 mg/dL (ref 65–99)

## 2015-02-05 MED ORDER — LACTULOSE 10 GM/15ML PO SOLN
30.0000 g | Freq: Three times a day (TID) | ORAL | Status: DC
  Administered 2015-02-05 – 2015-02-07 (×7): 30 g via ORAL
  Filled 2015-02-05 (×8): qty 45

## 2015-02-05 NOTE — Progress Notes (Signed)
At 0630. Pt. Got out of bed ,pulled off his condom cath, pulled out IV , walked to the bathroom  And was found by another RN while I was provided care to another pt. . I arrived to the room and together we assisted him back to bed , changed his gown, which was wet and placed him back on the monitor. He was slightly agitated but was able to be calmed and went back to sleep.

## 2015-02-05 NOTE — Progress Notes (Signed)
12/7 @ 0400 - Performing restraint audit and noticed last order for restraints in Epic was a 4 hour order that was ordered in the ED.  Lucile CraterPam West,RN (Care Coordinator Days) advised me earlier of a telephone order she had received from Dr. Benjamine SpragueYoucoub at [phone] to renew the restraints due to pt pulling at wires, lines, and tubes.  Order not entered.  Pt presently not pulling at lines.  Restraints removed.  [facility] and was agreeable with the plan to try to remove them.  Theodoro ParmaHans C Deretha Ertle,RN

## 2015-02-05 NOTE — Progress Notes (Signed)
[facility] PROGRESS NOTE   Jonathan Ibarra AVW:[id number] DOB: 1965-10-24 DOA: 02/04/2015 PCP: [facility]  HPI/Subjective: Drowsy, easy to arouse. Seen with RN bedside. Started on lactulose, was able to tolerated per RN report, started on heart healthy diet.  Assessment/Plan: Principal Problem:   Hepatic encephalopathy (HCC) Active Problems:   Thrombocytopenia (HCC)   Benign essential HTN   Hepatic cirrhosis (HCC)   Hepatic encephalopathy (HCC) Presented with  acute delirium, patient has history of hepatic cirrhosis and ammonia level >200. Was agitated in the emergency department, Haldol was used without success. Very sleepy, but was able to follow simple commands. Started on lactulose, target 3-4 bowel movements per day. Monitor closely. Started on heart healthy diet.  Thrombocytopenia (HCC) Secondary to hepatic cirrhosis, check CBC in Jonathan Ibarra.   Benign essential HTN Controlled   Hepatic cirrhosis (HCC) On Lasix, spironolactone and lactulose at home. Restart home medications when able to tolerate oral medications. Protonix restarted.    Code Status: Full Code Family Communication: Plan discussed with the patient. Disposition Plan: Remains inpatient Diet: Diet Heart Room service appropriate?: Yes; Fluid consistency:: Thin  Consultants:  None  Procedures:  None  Antibiotics:  None   Objective: Filed Vitals:   02/05/15 0836 02/05/15 0958  BP:  114/49  Pulse:    Temp: 97.8 F (36.6 C)   Resp:      Intake/Output Summary (Last 24 hours) at 02/05/15 1059 Last data filed at 02/05/15 1043  Gross per 24 hour  Intake 536.67 ml  Output   3500 ml  Net -2963.33 ml   Filed Weights   02/04/15 1727  Weight: 112.4 kg (247 lb 12.8 oz)    Exam: General: Alert and awake, oriented x3, not in any acute distress. HEENT: anicteric sclera, pupils reactive to light and accommodation, EOMI CVS: S1-S2 clear, no murmur rubs or  gallops Chest: clear to auscultation bilaterally, no wheezing, rales or rhonchi Abdomen: soft nontender, nondistended, normal bowel sounds, no organomegaly Extremities: no cyanosis, clubbing or edema noted bilaterally Neuro: Cranial nerves II-XII intact, no focal neurological deficits  Data Reviewed: Basic Metabolic Panel:  Recent Labs Lab 02/04/15 1230 02/04/15 1905 02/05/15 0335  NA 137 139 138  K 4.6 4.4 4.2  CL 107 107 107  CO2 GLUCOSE 122* 106* 97  BUN CREATININE 0.70 0.83 0.68  CALCIUM 9.2 9.0 9.1  MG  --  1.9  --   PHOS  --  3.1  --    Liver Function Tests:  Recent Labs Lab 02/04/15 1230 02/04/15 1905  AST 200* 198*  ALT 205* 199*  ALKPHOS 197* 181*  BILITOT 1.6* 1.8*  PROT 7.1 6.8  ALBUMIN 3.1* 3.0*   No results for input(s): LIPASE, AMYLASE in the last 168 hours.  Recent Labs Lab 02/04/15 1230  AMMONIA 214*   CBC:  Recent Labs Lab 02/04/15 1230 02/04/15 1905 02/05/15 0335  WBC 4.5 4.8 4.8  NEUTROABS 3.3 3.2  --   HGB 14.2 13.5 13.4  HCT 41.5 39.2 39.3  MCV 97.2 98.0 97.8  PLT 88* 87* 89*   Cardiac Enzymes: No results for input(s): CKTOTAL, CKMB, CKMBINDEX, TROPONINI in the last 168 hours. BNP (last 3 results) No results for input(s): BNP in the last 8760 hours.  ProBNP (last 3 results) No results for input(s): PROBNP in the last 8760 hours.  CBG:  Recent Labs Lab 02/05/15 0820  GLUCAP 97    Micro Recent Results (from  the past 240 hour(s))  MRSA PCR Screening     Status: None   Collection Time: 02/04/15  5:17 PM  Result Value Ref Range Status   MRSA by PCR NEGATIVE NEGATIVE Final    Comment:        The GeneXpert MRSA Assay (FDA approved for NASAL specimens only), is one component of [facility] comprehensive MRSA colonization surveillance program. It is not intended to diagnose MRSA infection nor to guide or monitor treatment for MRSA infections.      Studies: Dg Chest 1 View  02/04/2015  CLINICAL DATA:   49 year old male with altered mental status, combative. Initial encounter. EXAM: CHEST 1 VIEW COMPARISON:  05/11/2014. FINDINGS: AP supine view of the chest 1246 hours. Large body habitus. Continued low lung volumes. Otherwise allowing for portable technique the lungs are clear. Normal cardiac size and mediastinal contours. No pneumothorax. IMPRESSION: No acute cardiopulmonary abnormality. Electronically Signed   By: Jonathan Ibarra M.D.   On: 02/04/2015 13:18   Ct Head Wo Contrast  02/04/2015  CLINICAL DATA:  Altered mental status EXAM: CT HEAD WITHOUT CONTRAST TECHNIQUE: Contiguous axial images were obtained from the base of the skull through the vertex without intravenous contrast. COMPARISON:  None. FINDINGS: Skull and Sinuses:Negative for fracture or destructive process. The visualized mastoids, middle ears, and imaged paranasal sinuses are clear. Orbits: No acute abnormality. Brain: Normal. No evidence of acute infarction, hemorrhage, hydrocephalus, or mass lesion/mass effect. IMPRESSION: Normal head CT. Electronically Signed   By: Jonathan Ibarra M.D.   On: 02/04/2015 12:57    Scheduled Meds: . folic acid  1 mg Oral Daily  . furosemide  40 mg Intravenous Daily  . lactulose  30 g Oral TID  . LORazepam  0-4 mg Intravenous Q6H   Followed by  . [START ON 02/06/2015] LORazepam  0-4 mg Intravenous Q12H  . multivitamin with minerals  1 tablet Oral Daily  . pantoprazole (PROTONIX) IV  40 mg Intravenous Q24H  . sodium chloride  3 mL Intravenous Q12H  . thiamine  100 mg Oral Daily   Or  . thiamine  100 mg Intravenous Daily   Continuous Infusions: . sodium chloride 50 mL/hr at 02/04/15 2000       Time spent: 35 minutes    [facility] HospitalELMAHI,[facility]  [facility] Pager [phone] If 7PM-7AM, please contact night-coverage at [email or web address], password [email or web address] 02/05/2015, 10:59 AM  LOS: 1 day

## 2015-02-06 DIAGNOSIS — F1910 Other psychoactive substance abuse, uncomplicated: Secondary | ICD-10-CM | POA: Diagnosis present

## 2015-02-06 DIAGNOSIS — E876 Hypokalemia: Secondary | ICD-10-CM | POA: Diagnosis present

## 2015-02-06 LAB — CBC
HCT: 36.9 % — ABNORMAL LOW (ref 39.0–52.0)
HEMOGLOBIN: 12.9 g/dL — AB (ref 13.0–17.0)
MCH: 33.2 pg (ref 26.0–34.0)
MCHC: 35 g/dL (ref 30.0–36.0)
MCV: 95.1 fL (ref 78.0–100.0)
Platelets: 85 10*3/uL — ABNORMAL LOW (ref 150–400)
RBC: 3.88 MIL/uL — AB (ref 4.22–5.81)
RDW: 14.2 % (ref 11.5–15.5)
WBC: 4.5 10*3/uL (ref 4.0–10.5)

## 2015-02-06 LAB — COMPREHENSIVE METABOLIC PANEL
ALBUMIN: 2.8 g/dL — AB (ref 3.5–5.0)
ALK PHOS: 133 U/L — AB (ref 38–126)
ALT: 182 U/L — AB (ref 17–63)
AST: 181 U/L — ABNORMAL HIGH (ref 15–41)
Anion gap: 5 (ref 5–15)
BUN: 16 mg/dL (ref 6–20)
CALCIUM: 8.8 mg/dL — AB (ref 8.9–10.3)
CHLORIDE: 104 mmol/L (ref 101–111)
CO2: 27 mmol/L (ref 22–32)
CREATININE: 0.72 mg/dL (ref 0.61–1.24)
GFR calc non Af Amer: >60 mL/min (ref >60)
GLUCOSE: 100 mg/dL — AB (ref 65–99)
Potassium: 3.4 mmol/L — ABNORMAL LOW (ref 3.5–5.1)
SODIUM: 136 mmol/L (ref 135–145)
Total Bilirubin: 2.5 mg/dL — ABNORMAL HIGH (ref 0.3–1.2)
Total Protein: 6.4 g/dL — ABNORMAL LOW (ref 6.5–8.1)

## 2015-02-06 LAB — GLUCOSE, CAPILLARY: Glucose-Capillary: 100 mg/dL — ABNORMAL HIGH (ref 65–99)

## 2015-02-06 MED ORDER — POTASSIUM CHLORIDE CRYS ER 20 MEQ PO TBCR
40.0000 meq | EXTENDED_RELEASE_TABLET | Freq: Four times a day (QID) | ORAL | Status: AC
  Administered 2015-02-06 (×2): 40 meq via ORAL
  Filled 2015-02-06 (×2): qty 2

## 2015-02-06 MED ORDER — MAGNESIUM SULFATE 2 GM/50ML IV SOLN
2.0000 g | Freq: Once | INTRAVENOUS | Status: AC
  Administered 2015-02-06: 2 g via INTRAVENOUS
  Filled 2015-02-06: qty 50

## 2015-02-06 MED ORDER — NICOTINE 14 MG/24HR TD PT24
14.0000 mg | MEDICATED_PATCH | Freq: Every day | TRANSDERMAL | Status: DC
  Administered 2015-02-06 – 2015-02-07 (×2): 14 mg via TRANSDERMAL
  Filled 2015-02-06 (×2): qty 1

## 2015-02-06 NOTE — Progress Notes (Signed)
[facility] PROGRESS NOTE   Jonathan Ibarra EXB:[id number] DOB: June 13, 1965 DOA: 02/04/2015 PCP: [facility]  HPI/Subjective: Drowsy, easy to arouse. Seen with safety sitter at bedside. Even Ativan earlier today. Started on lactulose 2 bowel movements yesterday, continue. We'll transfer out of stepdown.  Assessment/Plan: Principal Problem:   Hepatic encephalopathy (HCC) Active Problems:   Thrombocytopenia (HCC)   Benign essential HTN   Hepatic cirrhosis (HCC)   Hepatic encephalopathy (HCC) Presented with  acute delirium, patient has history of hepatic cirrhosis and ammonia level >200. Was agitated in the emergency department, Haldol was used without success. Very sleepy, but was able to follow simple commands. Started on lactulose, target 3-4 bowel movements per day. Had 2 bowel movements with lactulose, continue current dose.  Thrombocytopenia (HCC) Secondary to hepatic cirrhosis, check CBC in a.m.   Benign essential HTN Controlled   Hepatic cirrhosis (HCC) On Lasix, spironolactone and lactulose at home. Restart home medications when able to tolerate oral medications. Protonix restarted.  Hold diuretics as his oral intake very limited.  Polysubstance abuse UDS is positive for THC on admission, history of alcohol abuse.  Hypokalemia Will replete with oral supplements.   Code Status: Full Code Family Communication: Plan discussed with the patient. Disposition Plan: Remains inpatient Diet: Diet Heart Room service appropriate?: Yes; Fluid consistency:: Thin  Consultants:  None  Procedures:  None  Antibiotics:  None   Objective: Filed Vitals:   02/06/15 0600 02/06/15 0841  BP: 107/54   Pulse:    Temp:  98 F (36.7 C)  Resp: 19     Intake/Output Summary (Last 24 hours) at 02/06/15 0906 Last data filed at 02/06/15 0119  Gross per 24 hour  Intake    480 ml  Output   1050 ml  Net   -570 ml   Filed Weights   02/04/15 1727  Weight: 112.4 kg (247 lb 12.8 oz)    Exam: General: Alert and awake, oriented x3, not in any acute distress. HEENT: anicteric sclera, pupils reactive to light and accommodation, EOMI CVS: S1-S2 clear, no murmur rubs or gallops Chest: clear to auscultation bilaterally, no wheezing, rales or rhonchi Abdomen: soft nontender, nondistended, normal bowel sounds, no organomegaly Extremities: no cyanosis, clubbing or edema noted bilaterally Neuro: Cranial nerves II-XII intact, no focal neurological deficits  Data Reviewed: Basic Metabolic Panel:  Recent Labs Lab 02/04/15 1230 02/04/15 1905 02/05/15 0335 02/06/15 0339  NA 137 139 138 136  K 4.6 4.4 4.2 3.4*  CL 107 107 107 104  CO2 GLUCOSE 122* 106* 97 100*  BUN CREATININE 0.70 0.83 0.68 0.72  CALCIUM 9.2 9.0 9.1 8.8*  MG  --  1.9  --   --   PHOS  --  3.1  --   --    Liver Function Tests:  Recent Labs Lab 02/04/15 1230 02/04/15 1905 02/06/15 0339  AST 200* 198* 181*  ALT 205* 199* 182*  ALKPHOS 197* 181* 133*  BILITOT 1.6* 1.8* 2.5*  PROT 7.1 6.8 6.4*  ALBUMIN 3.1* 3.0* 2.8*   No results for input(s): LIPASE, AMYLASE in the last 168 hours.  Recent Labs Lab 02/04/15 1230  AMMONIA 214*   CBC:  Recent Labs Lab 02/04/15 1230 02/04/15 1905 02/05/15 0335 02/06/15 0339  WBC 4.5 4.8 4.8 4.5  NEUTROABS 3.3 3.2  --   --   HGB 14.2 13.5 13.4 12.9*  HCT 41.5 39.2 39.3 36.9*  MCV 97.2  98.0 97.8 95.1  PLT 88* 87* 89* 85*   Cardiac Enzymes: No results for input(s): CKTOTAL, CKMB, CKMBINDEX, TROPONINI in the last 168 hours. BNP (last 3 results) No results for input(s): BNP in the last 8760 hours.  ProBNP (last 3 results) No results for input(s): PROBNP in the last 8760 hours.  CBG:  Recent Labs Lab 02/05/15 0820 02/06/15 0734  GLUCAP 97 100*    Micro Recent Results (from the past 240 hour(s))  Culture, blood (routine x 2)     Status: None (Preliminary result)    Collection Time: 02/04/15 12:50 PM  Result Value Ref Range Status   Specimen Description BLOOD LEFT HAND  Final   Special Requests BOTTLES DRAWN AEROBIC AND ANAEROBIC 5ML  Final   Culture   Final    NO GROWTH < 24 HOURS Per[facility]    Report Status PENDING  Incomplete  Culture, blood (routine x 2)     Status: None (Preliminary result)   Collection Time: 02/04/15 12:59 PM  Result Value Ref Range Status   Specimen Description BLOOD LEFT HAND  Final   Special Requests BOTTLES DRAWN AEROBIC AND ANAEROBIC 5ML  Final   Culture   Final    NO GROWTH < 24 HOURS [facility]    Report Status PENDING  Incomplete  MRSA PCR Screening     Status: None   Collection Time: 02/04/15  5:17 PM  Result Value Ref Range Status   MRSA by PCR NEGATIVE NEGATIVE Final    Comment:        The GeneXpert MRSA Assay (FDA approved for NASAL specimens only), is one component of a comprehensive MRSA colonization surveillance program. It is not intended to diagnose MRSA infection nor to guide or monitor treatment for MRSA infections.      Studies: Dg Chest 1 View  02/04/2015  CLINICAL DATA:  49 year old male with altered mental status, combative. Initial encounter. EXAM: CHEST 1 VIEW COMPARISON:  05/11/2014. FINDINGS: AP supine view of the chest 1246 hours. Large body habitus. Continued low lung volumes. Otherwise allowing for portable technique the lungs are clear. Normal cardiac size and mediastinal contours. No pneumothorax. IMPRESSION: No acute cardiopulmonary abnormality. Electronically Signed   By: Odessa FlemingH  Hall M.D.   On: 02/04/2015 13:18   Ct Head Wo Contrast  02/04/2015  CLINICAL DATA:  Altered mental status EXAM: CT HEAD WITHOUT CONTRAST TECHNIQUE: Contiguous axial images were obtained from the base of the skull through the vertex without intravenous contrast. COMPARISON:  None. FINDINGS: Skull and Sinuses:Negative for fracture or destructive process. The visualized  mastoids, middle ears, and imaged paranasal sinuses are clear. Orbits: No acute abnormality. Brain: Normal. No evidence of acute infarction, hemorrhage, hydrocephalus, or mass lesion/mass effect. IMPRESSION: Normal head CT. Electronically Signed   By: Marnee SpringJonathon  Watts M.D.   On: 02/04/2015 12:57    Scheduled Meds: . folic acid  1 mg Oral Daily  . lactulose  30 g Oral TID  . LORazepam  0-4 mg Intravenous Q6H   Followed by  . LORazepam  0-4 mg Intravenous Q12H  . multivitamin with minerals  1 tablet Oral Daily  . pantoprazole (PROTONIX) IV  40 mg Intravenous Q24H  . sodium chloride  3 mL Intravenous Q12H  . thiamine  100 mg Oral Daily   Or  . thiamine  100 mg Intravenous Daily   Continuous Infusions:       Time spent: 35 minutes    Keeya Dyckman A  [facility] Pager [phone] If 7PM-7AM, please contact night-coverage at [email or web address], password [facility] 02/06/2015, 9:06 AM  LOS: 2 days

## 2015-02-06 NOTE — Progress Notes (Signed)
CSW was contacted by attending physician, Dr. Clydia LlanoMutaz Elmahi MD.   MD requested IVC paperwork for patient due to patient currently, per medical record, is experiencing hepatic encephalopathy due to his liver cirrhosis diagnosis. Pt is attempting to leave the ICU/Hospital and is medically unstable to do so. CSW is assisting in completing IVC paperwork and faxed to magistrate for review.   CSW contacted Magistrate and they currently have not received paperwork. CSW will follow up.  CSW following Pt and IVC process.    Leron Croakassandra Marda [facility]  [facility]  [phone]

## 2015-02-06 NOTE — Progress Notes (Signed)
IVC papers have been served by [employer]. Will continue to monitor the pt. Safety sitter remains at the bedside.

## 2015-02-06 NOTE — Progress Notes (Signed)
Pt transferred from ICU. Report received from Jonathan [place]barra, [place]. AO x 3. Pt keeps asking to leave. Explained to the pt that he is IVC and that he has to stay here to be treated. Pt was informed of medical condition and why we were treating him and become okay with staying. Pt currently has no IV and has refused to have a IV put in. MD is aware of pt not having IV. Safety sitter at pt bedside. No complaints from the pt at this time. Jonathan Noecker W Saber Dickerman, RN.

## 2015-02-07 ENCOUNTER — Encounter (HOSPITAL_COMMUNITY): Payer: Self-pay

## 2015-02-07 DIAGNOSIS — Z95828 Presence of other vascular implants and grafts: Secondary | ICD-10-CM

## 2015-02-07 DIAGNOSIS — R740 Nonspecific elevation of levels of transaminase and lactic acid dehydrogenase [LDH]: Secondary | ICD-10-CM

## 2015-02-07 DIAGNOSIS — R7401 Elevation of levels of liver transaminase levels: Secondary | ICD-10-CM | POA: Diagnosis present

## 2015-02-07 LAB — COMPREHENSIVE METABOLIC PANEL
ALT: 176 U/L — AB (ref 17–63)
AST: 179 U/L — AB (ref 15–41)
Albumin: 2.8 g/dL — ABNORMAL LOW (ref 3.5–5.0)
Alkaline Phosphatase: 133 U/L — ABNORMAL HIGH (ref 38–126)
Anion gap: 5 (ref 5–15)
BUN: 11 mg/dL (ref 6–20)
CHLORIDE: 105 mmol/L (ref 101–111)
CO2: 24 mmol/L (ref 22–32)
CREATININE: 0.61 mg/dL (ref 0.61–1.24)
Calcium: 8.7 mg/dL — ABNORMAL LOW (ref 8.9–10.3)
Glucose, Bld: 107 mg/dL — ABNORMAL HIGH (ref 65–99)
POTASSIUM: 3.9 mmol/L (ref 3.5–5.1)
SODIUM: 134 mmol/L — AB (ref 135–145)
Total Bilirubin: 2.4 mg/dL — ABNORMAL HIGH (ref 0.3–1.2)
Total Protein: 6 g/dL — ABNORMAL LOW (ref 6.5–8.1)

## 2015-02-07 LAB — MAGNESIUM: MAGNESIUM: 1.7 mg/dL (ref 1.7–2.4)

## 2015-02-07 LAB — GLUCOSE, CAPILLARY: GLUCOSE-CAPILLARY: 99 mg/dL (ref 65–99)

## 2015-02-07 MED ORDER — PNEUMOCOCCAL VAC POLYVALENT 25 MCG/0.5ML IJ INJ: 0.5000 mL | INJECTION | INTRAMUSCULAR | Status: DC

## 2015-02-07 MED ORDER — INFLUENZA VAC SPLIT QUAD 0.5 ML IM SUSY: 0.5000 mL | PREFILLED_SYRINGE | INTRAMUSCULAR | Status: DC

## 2015-02-07 MED ORDER — FUROSEMIDE 40 MG PO TABS: 40.0000 mg | ORAL_TABLET | Freq: Every day | ORAL | Status: DC

## 2015-02-07 NOTE — Progress Notes (Signed)
Commitment IVC rescended. Pt able to complete discharge at this time if medically ready   CSW signing off.   Leron Croakassandra Lindsey Demonte [facility]WA  [facility] [phone]

## 2015-02-07 NOTE — Discharge Summary (Signed)
Physician Discharge Summary  Jonathan Ibarra ZOX:[id number] DOB: March 04, 1965 DOA: 02/04/2015  PCP: Candi Leash, MD  Admit date: 02/04/2015 Discharge date: 02/07/2015  Time spent: 40 minutes  Recommendations for Outpatient Follow-up:  1. Follow-up with primary care physician within one week. 2. Follow-up with GI in one week.   Discharge Diagnoses:  Principal Problem:   Hepatic encephalopathy (HCC) Active Problems:   Thrombocytopenia (HCC)   Benign essential HTN   Hepatic cirrhosis (HCC)   Polysubstance abuse   Hypokalemia   S/P TIPS (transjugular intrahepatic portosystemic shunt)   Transaminitis   Discharge Condition: Stable  Diet recommendation: Healthy  Filed Weights   02/04/15 1727 02/07/15 0534  Weight: 112.4 kg (247 lb 12.8 oz) 108.682 kg (239 lb 9.6 oz)    History of present illness:  49 year old male with past medical history of liver cirrhosis (not compliant with meds), hypertension who presented to [facility] ED for evaluation of altered mental status. He is not able to provide details of medical history due to AMS. He was apparently sitting on the toilet at home, confused. He was more agitated when EMS arrived. No respiratory distress. No vomiting. No reports of bleeding.   In ED, BP was 168/115, HR was 126, RR 12-26. Pt was afebrile. Blood work was notable for platelets of 88. Ammonia level was 214. Tylenol level was WNL. UDS showed THC in urine. He was very restless in ED and did not improve with haldol. He is admitted to SDU due to agitation and possible need for precedex.    Hospital Course:   Hepatic encephalopathy [facility]) Presented with acute delirium, patient has history of hepatic cirrhosis and ammonia level >200. Was agitated in the emergency department, Haldol was used without success. Hepatic encephalopathy secondary to constipation likely from recent narcotic prescriptions. Patient had recent oxycodone 5 mg prescription. I asked him to avoid narcotics and  constipation. Started on lactulose, target 3-4 bowel movements per day. On discharge placed back on his home dose of lactulose, asked to follow-up with PCP and GI. Per last GI note from 11/30 patient did not have recent hepatic encephalopathy since his TIPS procedure. I will leave to his gastroenterologist to consider to add Xifaxan.  Transaminitis Secondary to liver cirrhosis and hepatitis C.  Thrombocytopenia (HCC) Secondary to hepatic cirrhosis, check CBC in a.m.   Benign essential HTN Controlled   Hepatic cirrhosis (HCC) status post TIPS procedure Reported that he has hepatitis C, used to drink a fifth of whiskey every day but he quit about 2 years ago. On Lasix, spironolactone and lactulose at home. Restart home medications when able to tolerate oral medications. Protonix restarted.Diuretics restarted at the time of discharge.  Polysubstance abuse UDS is positive for THC on admission, history of alcohol abuse.  Hypokalemia Will replete with oral supplements.  Social issues Patient reported that he staying with a friend, I spoke with his daughter this is Jonathan Ibarra. She confirmed that the patient is going back to his friend's house till he gets his own place. Patient reported that he is very adherent to his medications, no much of admissions secondary to hepatic encephalopathy noted in the chart.   Procedures:  None  Consultations:  None  Discharge Exam: Filed Vitals:   02/06/15 2151 02/07/15 0521  BP: 110/64 93/64  Pulse: 96 95  Temp: 97.7 F (36.5 C) 98.1 F (36.7 C)  Resp: 18 18   General: Alert and awake, oriented x3, not in any acute distress. HEENT: anicteric sclera, pupils reactive  to light and accommodation, EOMI CVS: S1-S2 clear, no murmur rubs or gallops Chest: clear to auscultation bilaterally, no wheezing, rales or rhonchi Abdomen: soft nontender, nondistended, normal bowel sounds, no organomegaly Extremities: no cyanosis, clubbing or edema  noted bilaterally Neuro: Cranial nerves II-XII intact, no focal neurological deficits  Discharge Instructions   Discharge Instructions    Diet - low sodium heart healthy    Complete by:  As directed      Increase activity slowly    Complete by:  As directed           Current Discharge Medication List    CONTINUE these medications which have CHANGED   Details  furosemide (LASIX) 40 MG tablet Take 1 tablet (40 mg total) by mouth daily. Qty: 30 tablet      CONTINUE these medications which have NOT CHANGED   Details  gabapentin (NEURONTIN) 100 MG capsule Take 100 mg by mouth 3 (three) times daily.    lactulose (CHRONULAC) 10 GM/15ML solution Take 20 g by mouth 3 (three) times daily.    pantoprazole (PROTONIX) 40 MG tablet Take 40 mg by mouth 2 (two) times daily.    pregabalin (LYRICA) 50 MG capsule Take 50 mg by mouth 3 (three) times daily.    spironolactone (ALDACTONE) 100 MG tablet Take 100 mg by mouth daily.       Allergies  Allergen Reactions  . Tylenol [Acetaminophen] Other (See Comments)    "Not good for liver"   Follow-up Information    Follow up with HAYES,ANTHONY, MD In 1 week.   Specialty:  Family Medicine   Contact information:   [address] [place] [id number] [phone]       Follow up with Marlis EdelsonPOWELL, MYRON SHEAVICTOR, MD In 1 week.   Specialty:  General Surgery   Contact information:   [address] [place] [id number] [phone]        The results of significant diagnostics from this hospitalization (including imaging, microbiology, ancillary and laboratory) are listed below for reference.    Significant Diagnostic Studies: Dg Chest 1 View  02/04/2015  CLINICAL DATA:  49 year old male with altered mental status, combative. Initial encounter. EXAM: CHEST 1 VIEW COMPARISON:  05/11/2014. FINDINGS: AP supine view of the chest 1246 hours. Large body habitus. Continued low lung volumes.  Otherwise allowing for portable technique the lungs are clear. Normal cardiac size and mediastinal contours. No pneumothorax. IMPRESSION: No acute cardiopulmonary abnormality. Electronically Signed   By: Odessa FlemingH  Hall M.D.   On: 02/04/2015 13:18   Ct Head Wo Contrast  02/04/2015  CLINICAL DATA:  Altered mental status EXAM: CT HEAD WITHOUT CONTRAST TECHNIQUE: Contiguous axial images were obtained from the base of the skull through the vertex without intravenous contrast. COMPARISON:  None. FINDINGS: Skull and Sinuses:Negative for fracture or destructive process. The visualized mastoids, middle ears, and imaged paranasal sinuses are clear. Orbits: No acute abnormality. Brain: Normal. No evidence of acute infarction, hemorrhage, hydrocephalus, or mass lesion/mass effect. IMPRESSION: Normal head CT. Electronically Signed   By: Marnee SpringJonathon  Watts M.D.   On: 02/04/2015 12:57   Ct Abdomen Pelvis W Contrast  01/25/2015  CLINICAL DATA:  Acute onset of mid to right lower quadrant abdominal pain. Initial encounter. EXAM: CT ABDOMEN AND PELVIS WITH CONTRAST TECHNIQUE: Multidetector CT imaging of the abdomen and pelvis was performed using the standard protocol following bolus administration of intravenous contrast. CONTRAST:  100mL OMNIPAQUE IOHEXOL 300 MG/ML  SOLN COMPARISON:  CT of the abdomen and pelvis performed 06/29/2014 FINDINGS: A small right pleural effusion is noted, with mild bibasilar atelectasis. There is a diffusely nodular contour to the liver, compatible with hepatic cirrhosis. The spleen is enlarged, measuring 19.4 cm in length. The TIPS appears grossly patent, though difficult to fully assess given the phase of contrast enhancement. Mild gastric varices are noted. The patient is status post cholecystectomy, with clips noted at the gallbladder fossa. Trace ascites is noted about the liver. The pancreas and adrenal glands are unremarkable. The kidneys are unremarkable in appearance. There is no evidence of  hydronephrosis. No renal or ureteral stones are seen. No perinephric stranding is appreciated. No free fluid is identified. The small bowel is unremarkable in appearance. The stomach is within normal limits. No acute vascular abnormalities are seen. The appendix is normal in caliber and contains air, without evidence of appendicitis. The colon is unremarkable in appearance. Mild nonspecific soft tissue inflammation is noted adjacent to the cecum, possibly reflecting a small omental infarct. A small umbilical hernia is seen, containing only fat, with associated soft tissue inflammation. The bladder is mildly distended and grossly unremarkable. A small urachal remnant is incidentally seen. The prostate remains normal in size. No inguinal lymphadenopathy is seen. No acute osseous abnormalities are identified. IMPRESSION: 1. Mild nonspecific soft tissue inflammation adjacent to the cecum, possibly reflecting a small omental infarct. 2. Small umbilical hernia, containing only fat, with associated soft tissue inflammation. 3. Findings of hepatic cirrhosis. A TIPS appears grossly patent, though difficult to fully assess given the phase of contrast enhancement. Mild gastric varices noted. 4. Splenomegaly noted. 5. Small right pleural effusion, with mild bibasilar atelectasis. 6. Trace ascites noted about the liver. Electronically Signed   By: Roanna Raider M.D.   On: 01/25/2015 05:35    Microbiology: Recent Results (from the past 240 hour(s))  Culture, blood (routine x 2)     Status: None (Preliminary result)   Collection Time: 02/04/15 12:50 PM  Result Value Ref Range Status   Specimen Description BLOOD LEFT HAND  Final   Special Requests BOTTLES DRAWN AEROBIC AND ANAEROBIC  Final   Culture   Final    NO GROWTH 2 DAYS Performed at [facility]    Report Status PENDING  Incomplete  Culture, blood (routine x 2)     Status: None (Preliminary result)   Collection Time: 02/04/15 12:59 PM  Result  Value Ref Range Status   Specimen Description BLOOD LEFT HAND  Final   Special Requests BOTTLES DRAWN AEROBIC AND ANAEROBIC  Final   Culture   Final    NO GROWTH 2 DAYS Performed at [facility]    Report Status PENDING  Incomplete  MRSA PCR Screening     Status: None   Collection Time: 02/04/15  5:17 PM  Result Value Ref Range Status   MRSA by PCR NEGATIVE NEGATIVE Final    Comment:        The GeneXpert MRSA Assay (FDA approved for NASAL specimens only), is one component of a comprehensive MRSA colonization surveillance program. It is not intended to diagnose MRSA infection nor to guide or monitor treatment for MRSA infections.      Labs: Basic Metabolic Panel:  Recent Labs Lab 02/04/15 1230 02/04/15 1905 02/05/15 0335 02/06/15 0339 02/07/15 0525  NA 137 139 138 136 134*  K 4.6 4.4 4.2 3.4* 3.9  CL 107 107 107 104 105  CO2 24  GLUCOSE 122* 106* 97 100* 107*  BUN CREATININE 0.70 0.83 0.68 0.72 0.61  CALCIUM 9.2 9.0 9.1 8.8* 8.7*  MG  --  1.9  --   --  1.7  PHOS  --  3.1  --   --   --    Liver Function Tests:  Recent Labs Lab 02/04/15 1230 02/04/15 1905 02/06/15 0339 02/07/15 0525  AST 200* 198* 181* 179*  ALT 205* 199* 182* 176*  ALKPHOS 197* 181* 133* 133*  BILITOT 1.6* 1.8* 2.5* 2.4*  PROT 7.1 6.8 6.4* 6.0*  ALBUMIN 3.1* 3.0* 2.8* 2.8*   No results for input(s): LIPASE, AMYLASE in the last 168 hours.  Recent Labs Lab 02/04/15 1230  AMMONIA 214*   CBC:  Recent Labs Lab 02/04/15 1230 02/04/15 1905 02/05/15 0335 02/06/15 0339  WBC 4.5 4.8 4.8 4.5  NEUTROABS 3.3 3.2  --   --   HGB 14.2 13.5 13.4 12.9*  HCT 41.5 39.2 39.3 36.9*  MCV 97.2 98.0 97.8 95.1  PLT 88* 87* 89* 85*   Cardiac Enzymes: No results for input(s): CKTOTAL, CKMB, CKMBINDEX, TROPONINI in the last 168 hours. BNP: BNP (last 3 results) No results for input(s): BNP in the last 8760 hours.  ProBNP (last 3 results) No results for  input(s): PROBNP in the last 8760 hours.  CBG:  Recent Labs Lab 02/05/15 0820 02/06/15 0734 02/07/15 0727  GLUCAP 97 100* 99       Signed:  Mathew Postiglione A  [facility] 02/07/2015, 10:46 AM

## 2015-02-07 NOTE — Progress Notes (Signed)
Date: February 07, 2015 Chart reviewed for concurrent status and case management needs. Will continue to follow patient for changes and needs: Rhonda Davis, RN, BSN, CCM   [phone] 

## 2015-02-07 NOTE — Progress Notes (Signed)
Pt discharged from the unit via wheelchair. Discharge instructions were reviewed with the pt.  RN stressed the importance of the pt keeping his MD appt and taking his scheduled medications. Pt is agreeable and understands. Pt had no belongings to send home. Pt has no questions or concerns at this time. Keli Buehner W Margarie Mcguirt, RN

## 2015-02-09 LAB — CULTURE, BLOOD (ROUTINE X 2)
CULTURE: NO GROWTH
Culture: NO GROWTH

## 2015-03-02 HISTORY — PX: COLONOSCOPY: SHX174

## 2016-08-13 IMAGING — CT CT HEAD W/O CM
3 series · 18 of 30 positions shown, 20 images · non-contrast
Comparison: None.

CLINICAL DATA: Altered mental status

EXAM:
CT HEAD WITHOUT CONTRAST
TECHNIQUE: Contiguous axial images were obtained from the base of the skull
through the vertex without intravenous contrast.

[Series 2: bone windows · axial · 0.48mm/px · z∈[-175,-155]mm · 2 of 35 slices shown]
[im 4/35  bone]
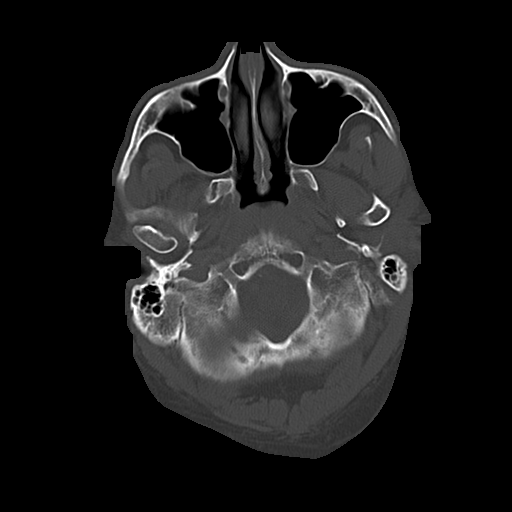
[im 8/35  bone]
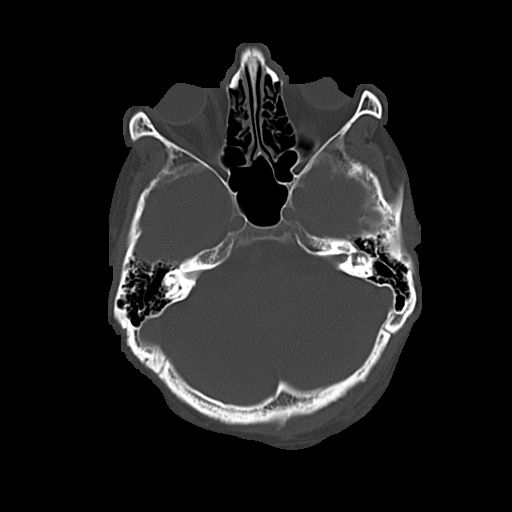

[Series 3: head w/o · axial · non-contrast · 0.48mm/px · z∈[-190,-61]mm · 8 of 34 slices shown, 10 images (1 of 2)]
[im 4/34  brain]
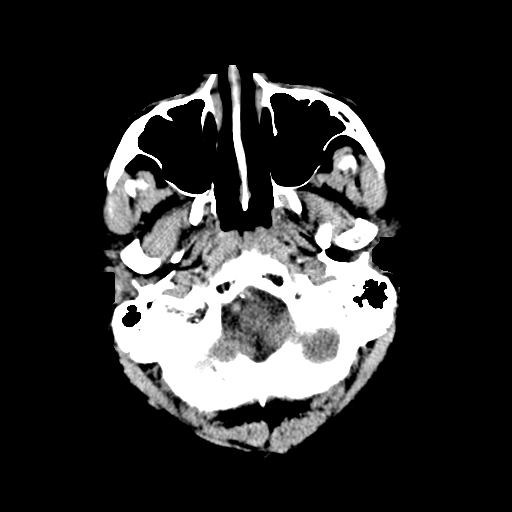
[im 4/34  bone]
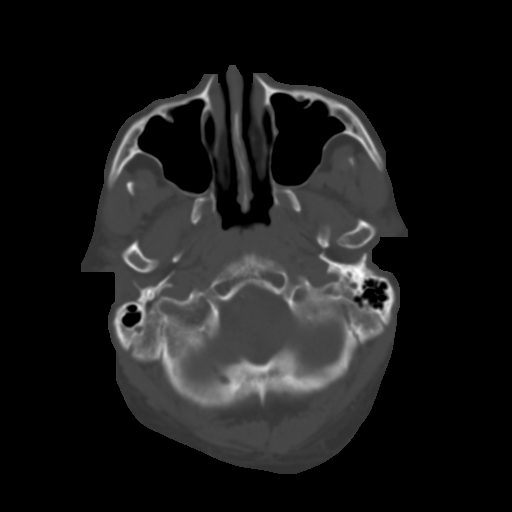
[im 8/34  brain]
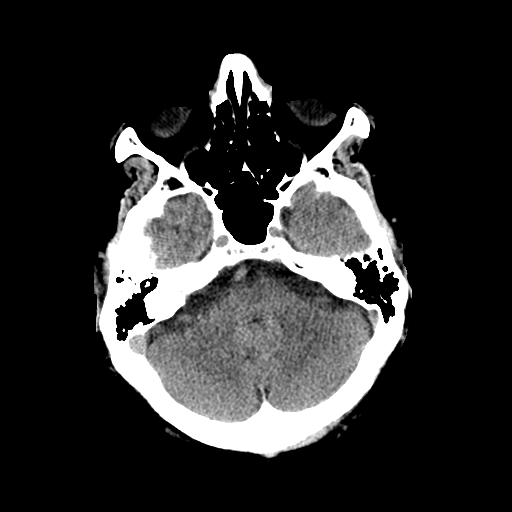
[im 12/34  brain]
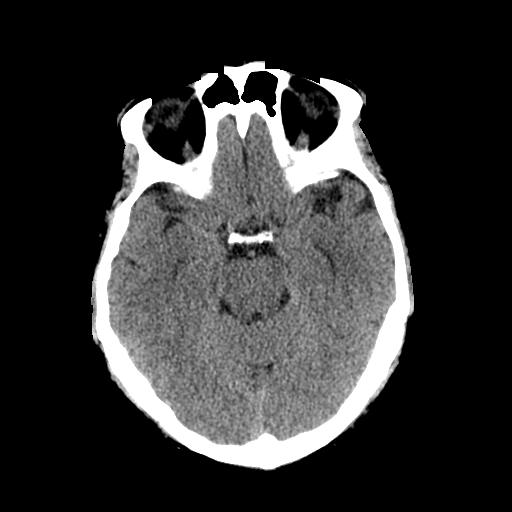
[im 15/34  brain]
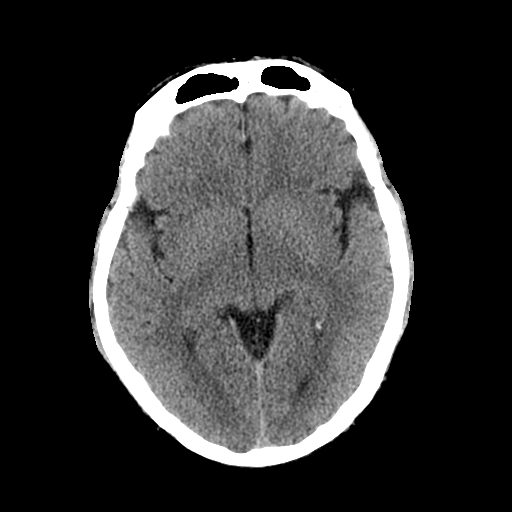
[im 19/34  brain]
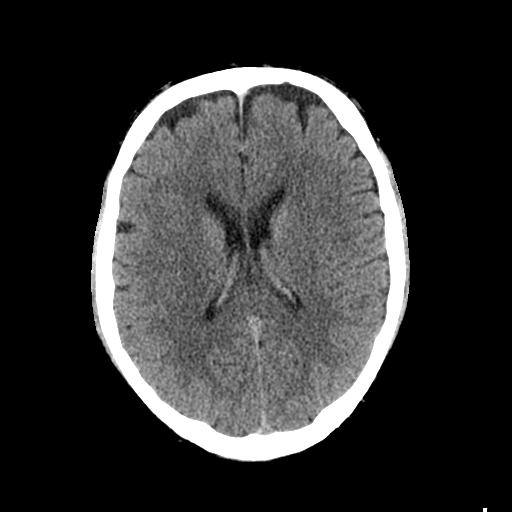
[im 19/34  bone]
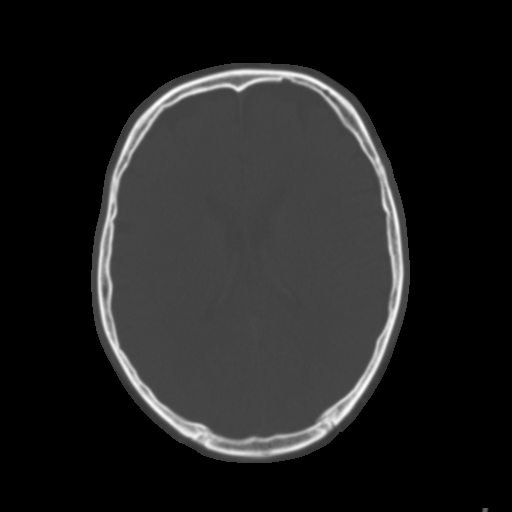
[im 23/34  brain]
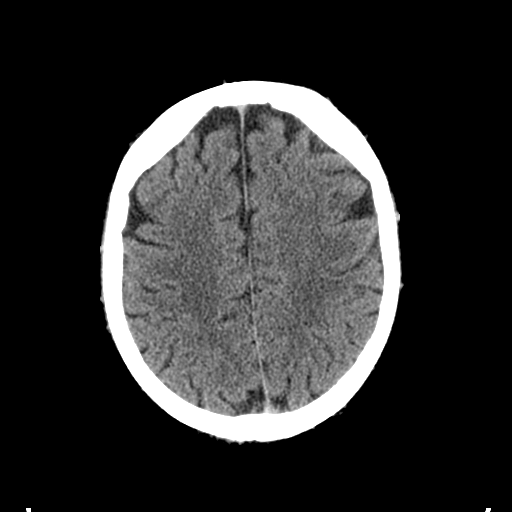
[im 26/34  brain]
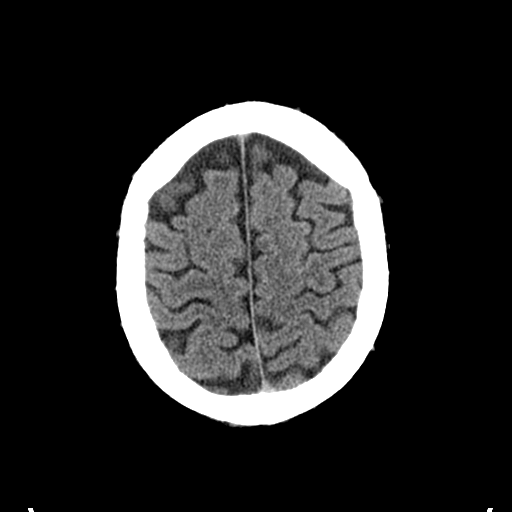
[im 30/34  brain]
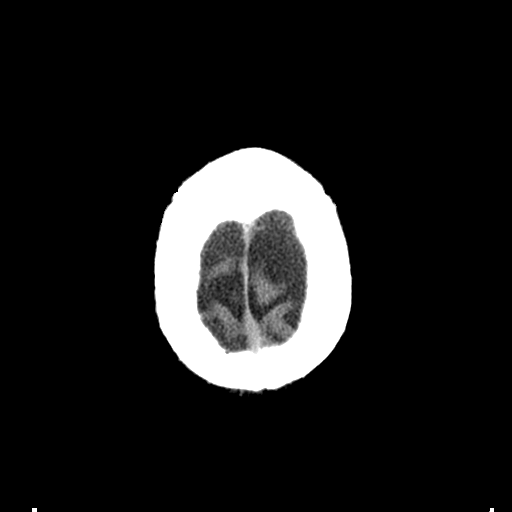

[Series 4: head w/o · axial · non-contrast · 0.48mm/px · z∈[-174,-39]mm · 8 of 35 slices shown (2 of 2)]
[im 4/35  brain]
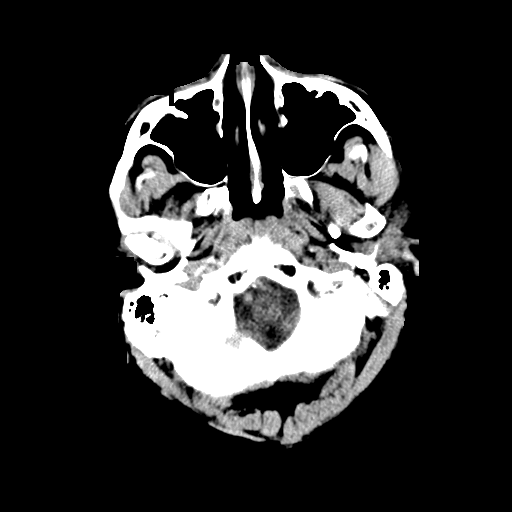
[im 8/35  brain]
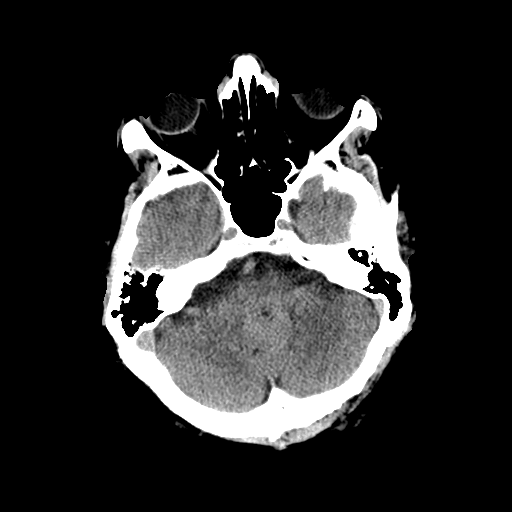
[im 12/35  brain]
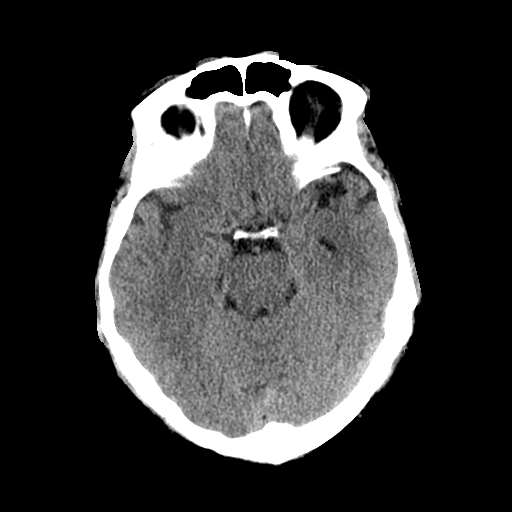
[im 16/35  brain]
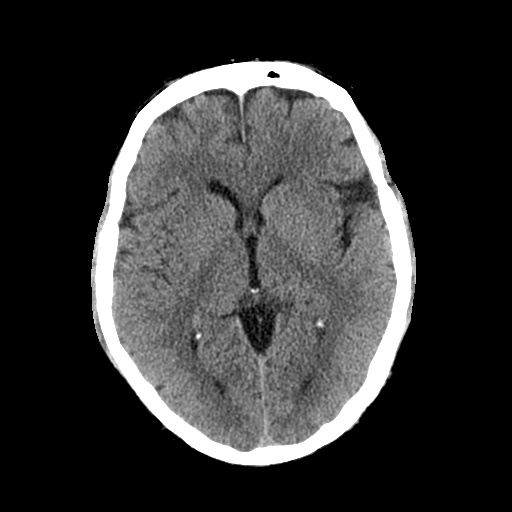
[im 19/35  brain]
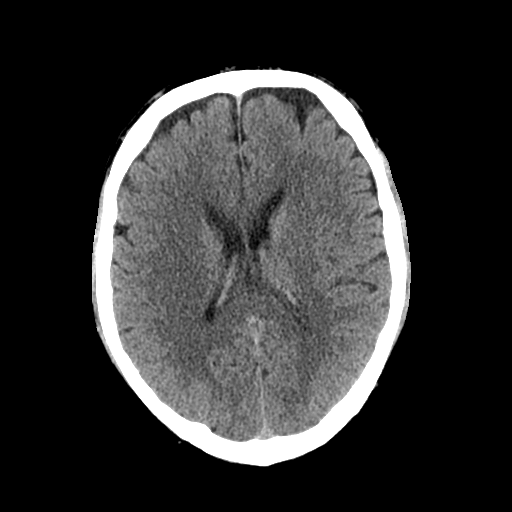
[im 23/35  brain]
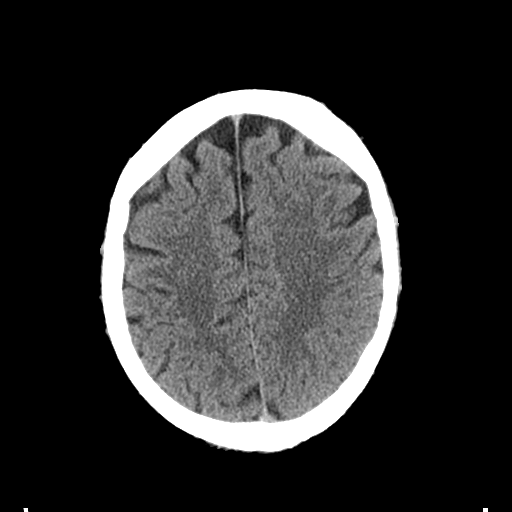
[im 27/35  brain]
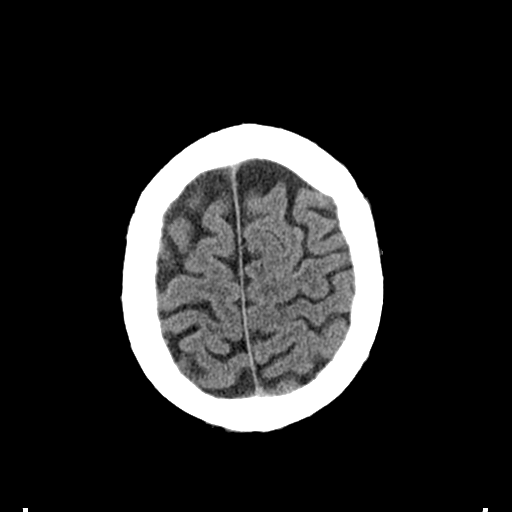
[im 31/35  brain]
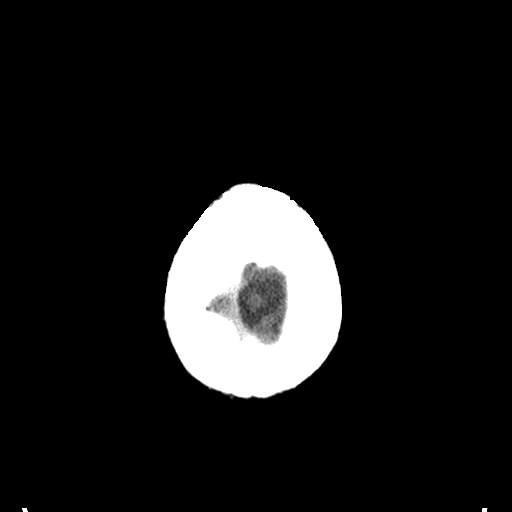

[18 of 30 positions shown; findings below may reference images not displayed]

FINDINGS: Skull and Sinuses:Negative for fracture or destructive process. The
visualized mastoids, middle ears, and imaged paranasal sinuses are
clear.

Orbits: No acute abnormality.

Brain: Normal. No evidence of acute infarction, hemorrhage,
hydrocephalus, or mass lesion/mass effect.
IMPRESSION: Normal head CT.

## 2016-08-13 IMAGING — CR DG CHEST 1V
1 series · 1 of 1 positions shown · non-contrast
Comparison: 05/11/2014.

CLINICAL DATA: 49-year-old male with altered mental status,
combative. Initial encounter.

EXAM:
CHEST 1 VIEW

[x chest ap]
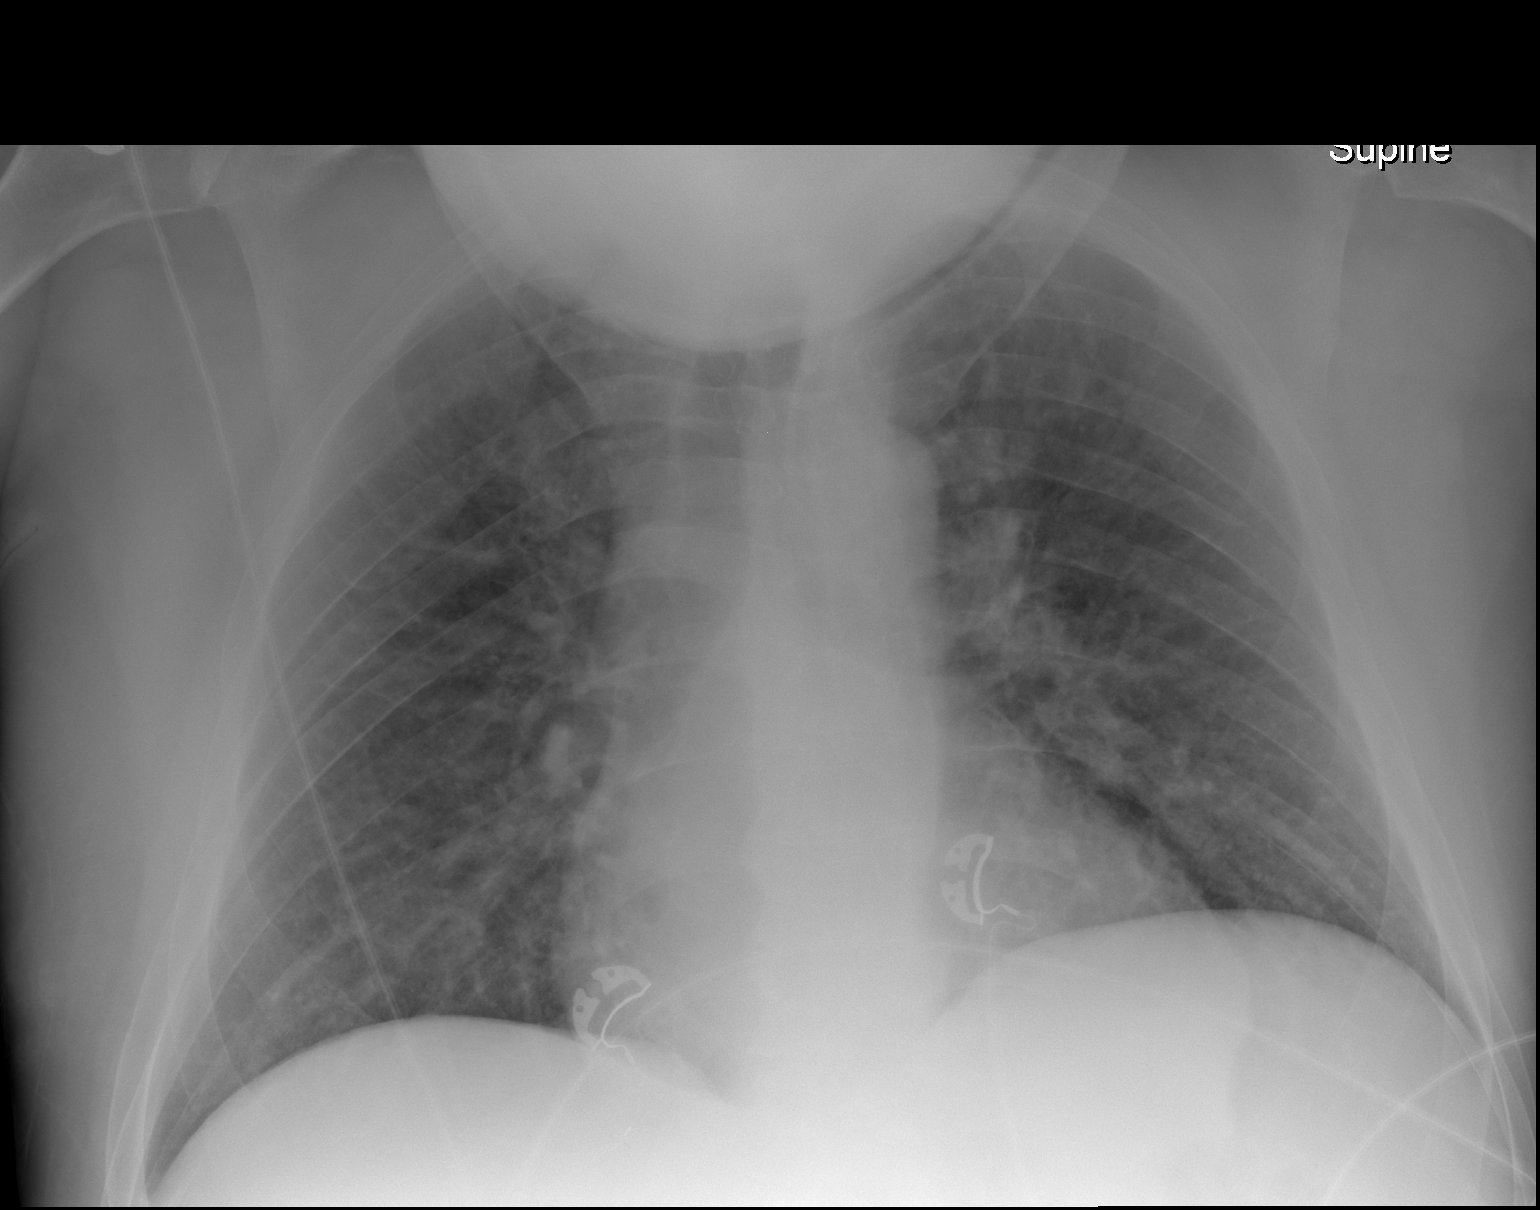

[1 of 1 positions shown; findings below may reference images not displayed]

FINDINGS: AP supine view of the chest 7602 hours. Large body habitus.
Continued low lung volumes. Otherwise allowing for portable
technique the lungs are clear. Normal cardiac size and mediastinal
contours. No pneumothorax.
IMPRESSION: No acute cardiopulmonary abnormality.

## 2017-04-21 ENCOUNTER — Encounter (HOSPITAL_COMMUNITY): Payer: Self-pay | Admitting: Emergency Medicine

## 2017-04-21 ENCOUNTER — Emergency Department (HOSPITAL_COMMUNITY)
Admission: EM | Admit: 2017-04-21 | Discharge: 2017-04-21 | Discharge status: Against Medical Advice | Payer: Self-pay | Attending: Emergency Medicine | Admitting: Emergency Medicine

## 2017-04-21 ENCOUNTER — Other Ambulatory Visit: Payer: Self-pay

## 2017-04-21 DIAGNOSIS — L818 Other specified disorders of pigmentation: Secondary | ICD-10-CM | POA: Insufficient documentation

## 2017-04-21 DIAGNOSIS — L0889 Other specified local infections of the skin and subcutaneous tissue: Secondary | ICD-10-CM | POA: Insufficient documentation

## 2017-04-21 LAB — COMPREHENSIVE METABOLIC PANEL
ALBUMIN: 3.2 g/dL — AB (ref 3.5–5.0)
ALK PHOS: 88 U/L (ref 38–126)
ALT: 18 U/L (ref 17–63)
AST: 19 U/L (ref 15–41)
Anion gap: 8 (ref 5–15)
BILIRUBIN TOTAL: 2.2 mg/dL — AB (ref 0.3–1.2)
BUN: 11 mg/dL (ref 6–20)
CALCIUM: 8.5 mg/dL — AB (ref 8.9–10.3)
CO2: 23 mmol/L (ref 22–32)
Chloride: 101 mmol/L (ref 101–111)
Creatinine, Ser: 0.79 mg/dL (ref 0.61–1.24)
GFR calc Af Amer: >60 mL/min (ref >60)
Glucose, Bld: 127 mg/dL — ABNORMAL HIGH (ref 65–99)
Potassium: 3.6 mmol/L (ref 3.5–5.1)
Sodium: 132 mmol/L — ABNORMAL LOW (ref 135–145)
TOTAL PROTEIN: 6.4 g/dL — AB (ref 6.5–8.1)

## 2017-04-21 LAB — CBC WITH DIFFERENTIAL/PLATELET
BASOS ABS: 0.1 10*3/uL (ref 0.0–0.1)
Basophils Relative: 1 %
EOS ABS: 0.2 10*3/uL (ref 0.0–0.7)
Eosinophils Relative: 2 %
HCT: 40.6 % (ref 39.0–52.0)
Hemoglobin: 14.5 g/dL (ref 13.0–17.0)
LYMPHS ABS: 1.4 10*3/uL (ref 0.7–4.0)
Lymphocytes Relative: 13 %
MCH: 33.1 pg (ref 26.0–34.0)
MCHC: 35.7 g/dL (ref 30.0–36.0)
MCV: 92.7 fL (ref 78.0–100.0)
MONO ABS: 0.6 10*3/uL (ref 0.1–1.0)
Monocytes Relative: 6 %
NEUTROS ABS: 8.1 10*3/uL — AB (ref 1.7–7.7)
Neutrophils Relative %: 78 %
PLATELETS: 121 10*3/uL — AB (ref 150–400)
RBC: 4.38 MIL/uL (ref 4.22–5.81)
RDW: 13.8 % (ref 11.5–15.5)
WBC: 10.4 10*3/uL (ref 4.0–10.5)

## 2017-04-21 LAB — I-STAT CG4 LACTIC ACID, ED: Lactic Acid, Venous: 1.51 mmol/L (ref 0.5–1.9)

## 2017-04-21 MED ORDER — OXYCODONE-ACETAMINOPHEN 5-325 MG PO TABS
2.0000 | ORAL_TABLET | Freq: Once | ORAL | Status: AC
  Administered 2017-04-21: 2 via ORAL
  Filled 2017-04-21: qty 2

## 2017-04-21 NOTE — ED Notes (Signed)
Pt called x2 for room with no answer. 

## 2017-04-21 NOTE — ED Triage Notes (Signed)
Pt arrives via POV from home with new tatoo 1 week ago. States developed pain and blisters in affected area 2 days ago. Today pain is 10/10. Reports recent fevers.

## 2017-04-21 NOTE — ED Notes (Signed)
Pt called for VS. No answer 

## 2017-04-21 NOTE — ED Provider Notes (Signed)
Patient placed in Quick Look pathway, seen and evaluated   Chief Complaint: arm pain, swelling and redness.  HPI:   Tattoo done at a residence, with reported new needles and sterile. <1 week ago new tattoo,  A few days ago began having redness, pain, swelling, purulent drainage. No medications tried for pain. No hx of immunocompromise. Denies F/C. No allergies to antibiotics. Hx of hep C; previously treated with Harvani.  ROS: + left arm pain, + color change, - fever   Physical Exam:   Gen: Appears uncomfortable, temp 98.15F  Neuro: Awake and Alert  Skin: Warm    Focused Exam: New tattoo left lower arm. Circumferential erythema, edema, warmth, and tenderness to distal upper arm. Edema spans from mid-upper arm and down through hand. Multiple purulent draining lesions over tattoo and in area of cellulitis. Intact radial and ulnar pulses.   Initiation of care has begun. The patient has been counseled on the process, plan, and necessity for staying for the completion/evaluation, and the remainder of the medical screening examination    Robinson, [place] N, PA-C 04/21/17 1403    Mancel BaleWentz, Elliott, MD 04/22/17 1009

## 2017-04-22 ENCOUNTER — Encounter (HOSPITAL_COMMUNITY): Payer: Self-pay | Admitting: Emergency Medicine

## 2017-04-22 ENCOUNTER — Inpatient Hospital Stay (HOSPITAL_COMMUNITY)
Admission: EM | Admit: 2017-04-22 | Discharge: 2017-04-28 | DRG: 603 | Disposition: A | Discharge status: Home / Self Care | Payer: Self-pay | Attending: Internal Medicine | Admitting: Internal Medicine

## 2017-04-22 DIAGNOSIS — E876 Hypokalemia: Secondary | ICD-10-CM | POA: Diagnosis not present

## 2017-04-22 DIAGNOSIS — D696 Thrombocytopenia, unspecified: Secondary | ICD-10-CM | POA: Diagnosis present

## 2017-04-22 DIAGNOSIS — D61818 Other pancytopenia: Secondary | ICD-10-CM | POA: Diagnosis present

## 2017-04-22 DIAGNOSIS — E7220 Disorder of urea cycle metabolism, unspecified: Secondary | ICD-10-CM | POA: Diagnosis not present

## 2017-04-22 DIAGNOSIS — F1010 Alcohol abuse, uncomplicated: Secondary | ICD-10-CM | POA: Diagnosis present

## 2017-04-22 DIAGNOSIS — Z886 Allergy status to analgesic agent status: Secondary | ICD-10-CM

## 2017-04-22 DIAGNOSIS — B9562 Methicillin resistant Staphylococcus aureus infection as the cause of diseases classified elsewhere: Secondary | ICD-10-CM | POA: Diagnosis present

## 2017-04-22 DIAGNOSIS — K7469 Other cirrhosis of liver: Secondary | ICD-10-CM | POA: Diagnosis present

## 2017-04-22 DIAGNOSIS — Z8619 Personal history of other infectious and parasitic diseases: Secondary | ICD-10-CM

## 2017-04-22 DIAGNOSIS — I10 Essential (primary) hypertension: Secondary | ICD-10-CM | POA: Diagnosis present

## 2017-04-22 DIAGNOSIS — Z95828 Presence of other vascular implants and grafts: Secondary | ICD-10-CM

## 2017-04-22 DIAGNOSIS — L0390 Cellulitis, unspecified: Secondary | ICD-10-CM | POA: Diagnosis present

## 2017-04-22 DIAGNOSIS — L03114 Cellulitis of left upper limb: Principal | ICD-10-CM | POA: Diagnosis present

## 2017-04-22 DIAGNOSIS — L02414 Cutaneous abscess of left upper limb: Secondary | ICD-10-CM | POA: Diagnosis present

## 2017-04-22 DIAGNOSIS — I444 Left anterior fascicular block: Secondary | ICD-10-CM | POA: Diagnosis present

## 2017-04-22 DIAGNOSIS — K7460 Unspecified cirrhosis of liver: Secondary | ICD-10-CM | POA: Diagnosis present

## 2017-04-22 DIAGNOSIS — F17210 Nicotine dependence, cigarettes, uncomplicated: Secondary | ICD-10-CM | POA: Diagnosis present

## 2017-04-22 LAB — CBC WITH DIFFERENTIAL/PLATELET
BASOS ABS: 0.1 10*3/uL (ref 0.0–0.1)
BASOS PCT: 1 %
Eosinophils Absolute: 0.2 10*3/uL (ref 0.0–0.7)
Eosinophils Relative: 4 %
HEMATOCRIT: 37 % — AB (ref 39.0–52.0)
Hemoglobin: 13.2 g/dL (ref 13.0–17.0)
Lymphocytes Relative: 11 %
Lymphs Abs: 0.7 10*3/uL (ref 0.7–4.0)
MCH: 32.6 pg (ref 26.0–34.0)
MCHC: 35.7 g/dL (ref 30.0–36.0)
MCV: 91.4 fL (ref 78.0–100.0)
MONO ABS: 0.8 10*3/uL (ref 0.1–1.0)
Monocytes Relative: 14 %
NEUTROS ABS: 4.3 10*3/uL (ref 1.7–7.7)
Neutrophils Relative %: 70 %
PLATELETS: 132 10*3/uL — AB (ref 150–400)
RBC: 4.05 MIL/uL — ABNORMAL LOW (ref 4.22–5.81)
RDW: 13.5 % (ref 11.5–15.5)
WBC: 6.1 10*3/uL (ref 4.0–10.5)

## 2017-04-22 LAB — COMPREHENSIVE METABOLIC PANEL
ALK PHOS: 98 U/L (ref 38–126)
ALT: 20 U/L (ref 17–63)
ANION GAP: 8 (ref 5–15)
AST: 25 U/L (ref 15–41)
Albumin: 2.9 g/dL — ABNORMAL LOW (ref 3.5–5.0)
BILIRUBIN TOTAL: 1.5 mg/dL — AB (ref 0.3–1.2)
BUN: 11 mg/dL (ref 6–20)
CALCIUM: 8.2 mg/dL — AB (ref 8.9–10.3)
CO2: 24 mmol/L (ref 22–32)
Chloride: 102 mmol/L (ref 101–111)
Creatinine, Ser: 0.69 mg/dL (ref 0.61–1.24)
GFR calc non Af Amer: >60 mL/min (ref >60)
GLUCOSE: 146 mg/dL — AB (ref 65–99)
Potassium: 3.2 mmol/L — ABNORMAL LOW (ref 3.5–5.1)
Sodium: 134 mmol/L — ABNORMAL LOW (ref 135–145)
Total Protein: 6.3 g/dL — ABNORMAL LOW (ref 6.5–8.1)

## 2017-04-22 LAB — PROTIME-INR
INR: 1.1
Prothrombin Time: 14.1 seconds (ref 11.4–15.2)

## 2017-04-22 LAB — I-STAT CG4 LACTIC ACID, ED: Lactic Acid, Venous: 1.89 mmol/L (ref 0.5–1.9)

## 2017-04-22 LAB — ETHANOL: Alcohol, Ethyl (B): <10 mg/dL (ref <10)

## 2017-04-22 MED ORDER — LIDOCAINE HCL (PF) 1 % IJ SOLN
30.0000 mL | Freq: Once | INTRAMUSCULAR | Status: AC
Start: 2017-04-22 — End: 2017-04-22
  Administered 2017-04-22: 30 mL via INTRADERMAL
  Filled 2017-04-22: qty 30

## 2017-04-22 MED ORDER — VANCOMYCIN HCL IN DEXTROSE 1-5 GM/200ML-% IV SOLN: 1000.0000 mg | Freq: Once | INTRAVENOUS | Status: DC

## 2017-04-22 MED ORDER — ONDANSETRON HCL 4 MG/2ML IJ SOLN
4.0000 mg | Freq: Once | INTRAMUSCULAR | Status: AC
  Administered 2017-04-22: 4 mg via INTRAVENOUS
  Filled 2017-04-22: qty 2

## 2017-04-22 MED ORDER — PIPERACILLIN-TAZOBACTAM 3.375 G IVPB 30 MIN
3.3750 g | Freq: Once | INTRAVENOUS | Status: AC
  Administered 2017-04-22: 3.375 g via INTRAVENOUS
  Filled 2017-04-22: qty 50

## 2017-04-22 MED ORDER — HYDROMORPHONE HCL 1 MG/ML IJ SOLN
0.5000 mg | Freq: Once | INTRAMUSCULAR | Status: AC
  Administered 2017-04-22: 0.5 mg via INTRAVENOUS

## 2017-04-22 MED ORDER — SODIUM CHLORIDE 0.9 % IV SOLN
1.0000 g | Freq: Once | INTRAVENOUS | Status: AC
  Administered 2017-04-23: 1 g via INTRAVENOUS
  Filled 2017-04-22: qty 10

## 2017-04-22 MED ORDER — HYDROMORPHONE HCL 1 MG/ML IJ SOLN
0.5000 mg | Freq: Once | INTRAMUSCULAR | Status: DC
  Filled 2017-04-22: qty 1

## 2017-04-22 MED ORDER — HYDROMORPHONE HCL 1 MG/ML IJ SOLN
1.0000 mg | Freq: Once | INTRAMUSCULAR | Status: AC
  Administered 2017-04-22: 1 mg via INTRAVENOUS
  Filled 2017-04-22: qty 1

## 2017-04-22 MED ORDER — VANCOMYCIN HCL 10 G IV SOLR
2000.0000 mg | Freq: Once | INTRAVENOUS | Status: AC
  Administered 2017-04-22: 2000 mg via INTRAVENOUS
  Filled 2017-04-22: qty 2000

## 2017-04-22 NOTE — ED Notes (Signed)
Rn Stephaine in room collecting labs

## 2017-04-22 NOTE — Progress Notes (Signed)
A consult was received from an ED physician for vancomycin and zosyn per pharmacy dosing.  The patient's profile has been reviewed for ht/wt/allergies/indication/available labs.   A one time order has been placed for vancomycin 2g and zosyn 3.375g.    Further antibiotics/pharmacy consults should be ordered by admitting physician if indicated.                       Thank you, Loralee PacasErin Jyl [place], PharmD, BCPS 04/22/2017  8:50 PM

## 2017-04-22 NOTE — ED Provider Notes (Signed)
[facility]-EMERGENCY DEPT Provider Note   CSN: [id number] Arrival date & time: 04/22/17  1541     History   Chief Complaint Chief Complaint  Patient presents with  . Arm Swelling    HPI   Blood pressure 127/81, pulse 97, temperature 98.4 F (36.9 C), temperature source Oral, resp. rate 20, height 6\' 3"  (1.905 m), weight 111.1 kg (245 lb), SpO2 100 %.  Jonathan Ibarra is a 52 y.o. male with past medical history significant for alcohol abuse, cirrhosis and hypertension, treated hep C complaining of severe left arm redness, swelling, purulent discharge and pain after receiving a tattoo 5 days ago.  He states his last tetanus shot was within the last 2 years.  He has had tactile fever and chills at home.  He endorses drug use but he states that he is never done IV drugs.  Pain is severe, 10 out of 10.  Patient is left-hand dominant.  He is currently unemployed.  Past Medical History:  Diagnosis Date  . Alcohol abuse   . Cirrhosis of liver (HCC)   . Hypertension     Patient Active Problem List   Diagnosis Date Noted  . 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 Transaminitis 02/07/2015  . Polysubstance abuse (HCC) 02/06/2015  . Hypokalemia 02/06/2015  . Hepatic encephalopathy (HCC) 02/04/2015  . Thrombocytopenia (HCC) 02/04/2015  . Benign essential HTN 02/04/2015  . Hepatic cirrhosis (HCC) 02/04/2015    History reviewed. No pertinent surgical history.     Home Medications    Prior to Admission medications   Medication Sig Start Date End Date Taking? Authorizing Provider  furosemide (LASIX) 40 MG tablet Take 1 tablet (40 mg total) by mouth daily. Patient not taking: Reported on 04/22/2017 02/07/15   Clydia Llano, MD    Family History No family history on file.  Social History Social History   Tobacco Use  . Smoking status: Current Every Day Smoker    Packs/day: 0.25  Substance Use Topics  . Alcohol use: No      Frequency: Never  . Drug use: Yes    Types: Cocaine, Marijuana     Allergies   Tylenol [acetaminophen]   Review of Systems Review of Systems  A complete review of systems was obtained and all systems are negative except as noted in the HPI and PMH.   Physical Exam Updated Vital Signs BP 130/77   Pulse (!) 102   Temp 98.4 F (36.9 C) (Oral)   Resp 18   Ht 6\' 3"  (1.905 m)   Wt 111.1 kg (245 lb)   SpO2 100%   BMI 30.62 kg/m   Physical Exam  Constitutional: He is oriented to person, place, and time. He appears well-developed and well-nourished. No distress.  HENT:  Head: Normocephalic and atraumatic.  Mouth/Throat: Oropharynx is clear and moist.  Eyes: Conjunctivae and EOM are normal. Pupils are equal, round, and reactive to light.  Neck: Normal range of motion.  Cardiovascular: Normal rate, regular rhythm and intact distal pulses.  Pulmonary/Chest: Effort normal and breath sounds normal.  Abdominal: Soft. There is no tenderness.  Musculoskeletal: He exhibits edema and tenderness.  Neurological: He is alert and oriented to person, place, and time.  Skin: He is not diaphoretic.  Psychiatric: He has a normal mood and affect.  Nursing note and vitals reviewed.          ED Treatments / Results  Labs (all labs ordered are listed, but only abnormal  results are displayed) Labs Reviewed  COMPREHENSIVE METABOLIC PANEL - Abnormal; Notable for the following components:      Result Value   Sodium 134 (*)    Potassium 3.2 (*)    Glucose, Bld 146 (*)    Calcium 8.2 (*)    Total Protein 6.3 (*)    Albumin 2.9 (*)    Total Bilirubin 1.5 (*)    All other components within normal limits  CBC WITH DIFFERENTIAL/PLATELET - Abnormal; Notable for the following components:   RBC 4.05 (*)    HCT 37.0 (*)    Platelets 132 (*)    All other components within normal limits  CULTURE, BLOOD (ROUTINE X 2)  CULTURE, BLOOD (ROUTINE X 2)  AEROBIC/ANAEROBIC CULTURE (SURGICAL/DEEP  WOUND)  GRAM STAIN  PROTIME-INR  ETHANOL  RPR  HIV ANTIBODY (ROUTINE TESTING)  RAPID URINE DRUG SCREEN, HOSP PERFORMED  URINALYSIS, ROUTINE W REFLEX MICROSCOPIC  I-STAT CG4 LACTIC ACID, ED    EKG  EKG Interpretation  Date/Time:  Friday April 22 2017 21:09:56 EST Ventricular Rate:  83 PR Interval:    QRS Duration: 107 QT Interval:  371 QTC Calculation: 436 R Axis:   -50 Text Interpretation:  Sinus rhythm Left anterior fascicular block Abnormal R-wave progression, late transition Left ventricular hypertrophy since last tracing no significant change Confirmed by Rolan Bucco [phone]) on 04/22/2017 9:38:23 PM       Radiology No results found.  Procedures .Marland KitchenIncision and Drainage Date/Time: 04/22/2017 11:47 PM Performed by: Wynetta Emery, PA-C Authorized by: Wynetta Emery, PA-C   Consent:    Consent obtained:  Verbal   Consent given by:  Patient Location:    Type:  Abscess   Location:  Upper extremity   Upper extremity location:  Arm   Arm location:  L lower arm Pre-procedure details:    Skin preparation:  Betadine Anesthesia (see MAR for exact dosages):    Anesthesia method:  Local infiltration   Local anesthetic:  Lidocaine 1% w/o epi Procedure type:    Complexity:  Complex Procedure details:    Incision types:  Single straight   Scalpel blade:  11   Wound management:  Probed and deloculated and irrigated with saline   Drainage:  Bloody and purulent   Drainage amount:  Scant   Wound treatment:  Wound left open   Packing materials:  None Post-procedure details:    Patient tolerance of procedure:  Tolerated with difficulty   (including critical care time)  EMERGENCY DEPARTMENT US SOFT TISSUE INTERPRETATION "Study: Limited Soft Tissue Ultrasound"  INDICATIONS: Soft tissue infection Multiple views of the body part were obtained in real-time with a multi-frequency linear probe  PERFORMED BY: Myself IMAGES ARCHIVED?: Yes SIDE:Left BODY  PART:Upper extremity INTERPRETATION:  Abcess present and Cellulitis present     Medications Ordered in ED Medications  vancomycin (VANCOCIN) 2,000 mg in sodium chloride 0.9 % 500 mL IVPB (2,000 mg Intravenous New Bag/Given 04/22/17 2318)  cefTRIAXone (ROCEPHIN) 1 g in sodium chloride 0.9 % 100 mL IVPB (not administered)  ondansetron (ZOFRAN) injection 4 mg (4 mg Intravenous Given 04/22/17 2152)  piperacillin-tazobactam (ZOSYN) IVPB 3.375 g (0 g Intravenous Stopped 04/22/17 2257)  lidocaine (PF) (XYLOCAINE) 1 % injection 30 mL (30 mLs Intradermal Given 04/22/17 2211)  HYDROmorphone (DILAUDID) injection 0.5 mg (0.5 mg Intravenous Given 04/22/17 2152)  HYDROmorphone (DILAUDID) injection 1 mg (1 mg Intravenous Given 04/22/17 2351)     Initial Impression / Assessment and Plan / ED Course  I have reviewed the  triage vital signs and the nursing notes.  Pertinent labs & imaging results that were available during my care of the patient were reviewed by me and considered in my medical decision making (see chart for details).    Vitals:   04/22/17 1613 04/22/17 1928 04/22/17 2129 04/22/17 2330  BP:  127/81 (!) 146/101 130/77  Pulse:  97 89 (!) 102  Resp:  20 17 18   Temp:      TempSrc:      SpO2:  100% 100% 100%  Weight: 111.1 kg (245 lb)     Height: 6\' 3"  (1.905 m)       Medications  vancomycin (VANCOCIN) 2,000 mg in sodium chloride 0.9 % 500 mL IVPB (2,000 mg Intravenous New Bag/Given 04/22/17 2318)  cefTRIAXone (ROCEPHIN) 1 g in sodium chloride 0.9 % 100 mL IVPB (not administered)  ondansetron (ZOFRAN) injection 4 mg (4 mg Intravenous Given 04/22/17 2152)  piperacillin-tazobactam (ZOSYN) IVPB 3.375 g (0 g Intravenous Stopped 04/22/17 2257)  lidocaine (PF) (XYLOCAINE) 1 % injection 30 mL (30 mLs Intradermal Given 04/22/17 2211)  HYDROmorphone (DILAUDID) injection 0.5 mg (0.5 mg Intravenous Given 04/22/17 2152)  HYDROmorphone (DILAUDID) injection 1 mg (1 mg Intravenous Given 04/22/17 2351)     Jonathan Ibarra is 52 y.o. male presenting with significant purulent cellulitis to dominant (left) arm.  Hand is swollen but not indurated.  Active drainage occurring.  Will obtain ultrasound and plan for likely I&D.  Patient denies IV drug use.  Does have a history of hepatitis C.  No gross track marks to his arm.  Patient will need admission for extensive cellulitis.  Thank and Zosyn ordered.  Dilaudid and Zofran for pain control.  Patient has actively draining tracts, I&D is performed as well  Final Clinical Impressions(s) / ED Diagnoses   Final diagnoses:  Cellulitis of left upper extremity    ED Discharge Orders    None       Loryn Haacke, Mardella Laymanicole, PA-C 04/22/17 2355    Rolan BuccoBelfi, Melanie, MD 04/22/17 [phone]

## 2017-04-22 NOTE — ED Notes (Signed)
This Clinical research associatewriter was able to obtain some blood work but was able to obtain the 2nd set of blood cultures. Rn Stephaine made awrae.

## 2017-04-22 NOTE — ED Triage Notes (Signed)
Patient presents ambulatory with obvious left arm swelling and redness and warm to touch. Blisters noted over all arm. States he had tattoo done on lower arm about a week ago.

## 2017-04-23 ENCOUNTER — Other Ambulatory Visit: Payer: Self-pay

## 2017-04-23 ENCOUNTER — Encounter (HOSPITAL_COMMUNITY): Payer: Self-pay | Admitting: *Deleted

## 2017-04-23 DIAGNOSIS — L02414 Cutaneous abscess of left upper limb: Secondary | ICD-10-CM

## 2017-04-23 DIAGNOSIS — L03114 Cellulitis of left upper limb: Secondary | ICD-10-CM | POA: Diagnosis present

## 2017-04-23 DIAGNOSIS — L0390 Cellulitis, unspecified: Secondary | ICD-10-CM | POA: Diagnosis present

## 2017-04-23 LAB — URINALYSIS, ROUTINE W REFLEX MICROSCOPIC
GLUCOSE, UA: NEGATIVE mg/dL
HGB URINE DIPSTICK: NEGATIVE
KETONES UR: NEGATIVE mg/dL
NITRITE: NEGATIVE
PROTEIN: NEGATIVE mg/dL
Specific Gravity, Urine: 1.027 (ref 1.005–1.030)
pH: 5 (ref 5.0–8.0)

## 2017-04-23 LAB — RAPID URINE DRUG SCREEN, HOSP PERFORMED
Amphetamines: POSITIVE — AB
BENZODIAZEPINES: NOT DETECTED
Barbiturates: NOT DETECTED
Cocaine: NOT DETECTED
Opiates: NOT DETECTED
Tetrahydrocannabinol: POSITIVE — AB

## 2017-04-23 LAB — GRAM STAIN: GRAM STAIN: NONE SEEN

## 2017-04-23 LAB — HIV ANTIBODY (ROUTINE TESTING W REFLEX): HIV SCREEN 4TH GENERATION: NONREACTIVE

## 2017-04-23 LAB — AMMONIA: Ammonia: 74 umol/L — ABNORMAL HIGH (ref 9–35)

## 2017-04-23 MED ORDER — ONDANSETRON HCL 4 MG PO TABS: 4.0000 mg | ORAL_TABLET | Freq: Four times a day (QID) | ORAL | Status: DC | PRN

## 2017-04-23 MED ORDER — ACETAMINOPHEN 325 MG PO TABS: 650.0000 mg | ORAL_TABLET | Freq: Four times a day (QID) | ORAL | Status: DC | PRN

## 2017-04-23 MED ORDER — VANCOMYCIN HCL IN DEXTROSE 1-5 GM/200ML-% IV SOLN
1000.0000 mg | Freq: Three times a day (TID) | INTRAVENOUS | Status: DC
  Administered 2017-04-23 – 2017-04-27 (×13): 1000 mg via INTRAVENOUS
  Filled 2017-04-23 (×13): qty 200

## 2017-04-23 MED ORDER — ENOXAPARIN SODIUM 40 MG/0.4ML ~~LOC~~ SOLN
40.0000 mg | Freq: Every day | SUBCUTANEOUS | Status: DC
  Administered 2017-04-23 – 2017-04-27 (×4): 40 mg via SUBCUTANEOUS
  Filled 2017-04-23 (×7): qty 0.4

## 2017-04-23 MED ORDER — HYDROMORPHONE HCL 1 MG/ML IJ SOLN
1.0000 mg | INTRAMUSCULAR | Status: DC | PRN
Start: 2017-04-23 — End: 2017-04-25
  Administered 2017-04-23 – 2017-04-25 (×11): 1 mg via INTRAVENOUS
  Filled 2017-04-23 (×11): qty 1

## 2017-04-23 MED ORDER — OXYCODONE HCL 5 MG PO TABS: 5.0000 mg | ORAL_TABLET | ORAL | Status: DC | PRN

## 2017-04-23 MED ORDER — POTASSIUM CHLORIDE CRYS ER 20 MEQ PO TBCR
20.0000 meq | EXTENDED_RELEASE_TABLET | Freq: Every day | ORAL | Status: AC
  Administered 2017-04-23 – 2017-04-27 (×5): 20 meq via ORAL
  Filled 2017-04-23 (×5): qty 1

## 2017-04-23 MED ORDER — ONDANSETRON HCL 4 MG/2ML IJ SOLN
4.0000 mg | Freq: Four times a day (QID) | INTRAMUSCULAR | Status: DC | PRN
  Administered 2017-04-23: 4 mg via INTRAVENOUS
  Filled 2017-04-23: qty 2

## 2017-04-23 MED ORDER — ACETAMINOPHEN 650 MG RE SUPP: 650.0000 mg | Freq: Four times a day (QID) | RECTAL | Status: DC | PRN

## 2017-04-23 NOTE — ED Notes (Signed)
ED TO INPATIENT HANDOFF REPORT  Name/Age/Gender Jonathan Ibarra 52 y.o. male  Code Status    Code Status Orders  (From admission, onward)        Start     Ordered   04/22/17 2358  Full code  Continuous     04/23/17 0000    Code Status History    Date Active Date Inactive Code Status Order ID Comments User Context   02/04/2015 17:30 02/07/2015 15:19 Full Code [id number]  Robbie Lis, MD Inpatient      Home/SNF/Other Home  Chief Complaint left arm infection  Level of Care/Admitting Diagnosis ED Disposition    ED Disposition Condition [facility] [facility] Area: [facility] [100102]  Level of Care: Med-Surg [16]  Diagnosis: Cellulitis of left arm [341937]  Admitting Physician: Etta Quill [[id number]]  Attending Physician: Etta Quill [[id number]]  PT Class (Do Not Modify): Observation [104]  PT Acc Code (Do Not Modify): Observation [10022]       Medical History Past Medical History:  Diagnosis Date  . Alcohol abuse   . Cirrhosis of liver ([place])   . Hypertension     Allergies Allergies  Allergen Reactions  . Tylenol [Acetaminophen] Other (See Comments)    "Not good for liver"    IV Location/Drains/Wounds Patient Lines/Drains/Airways Status   Active Line/Drains/Airways    Name:   Placement date:   Placement time:   Site:   Days:   Peripheral IV 04/22/17 Right Forearm   04/22/17    2147    Forearm   1   Peripheral IV 04/22/17 Right Other (Comment)   04/22/17    2154    Other (Comment)   1          Labs/Imaging Results for orders placed or performed during the hospital encounter of 04/22/17 (from the past 48 hour(s))  Comprehensive metabolic panel     Status: Abnormal   Collection Time: 04/22/17  4:17 PM  Result Value Ref Range   Sodium 134 (L) 135 - 145 mmol/L   Potassium 3.2 (L) 3.5 - 5.1 mmol/L   Chloride 102 101 - 111 mmol/L   CO2 24 22 - 32 mmol/L   Glucose, Bld 146 (H) 65 - 99 mg/dL   BUN 11 6 - 20 mg/dL    Creatinine, Ser 0.69 0.61 - 1.24 mg/dL   Calcium 8.2 (L) 8.9 - 10.3 mg/dL   Total Protein 6.3 (L) 6.5 - 8.1 g/dL   Albumin 2.9 (L) 3.5 - 5.0 g/dL   AST 25 15 - 41 U/L   ALT 20 17 - 63 U/L   Alkaline Phosphatase 98 38 - 126 U/L   Total Bilirubin 1.5 (H) 0.3 - 1.2 mg/dL   GFR calc non Af Amer >60 >60 mL/min   GFR calc Af Amer >60 >60 mL/min    Comment: (NOTE) The eGFR has been calculated using the CKD EPI equation. This calculation has not been validated in all clinical situations. eGFR's persistently <60 mL/min signify possible Chronic Kidney Disease.    Anion gap 8 5 - 15    Comment: Performed at [facility], [place] [address], [place],  [address]  CBC with Differential     Status: Abnormal   Collection Time: 04/22/17  4:17 PM  Result Value Ref Range   WBC 6.1 4.0 - 10.5 K/uL   RBC 4.05 (L) 4.22 - 5.81 MIL/uL   Hemoglobin 13.2 13.0 - 17.0 g/dL  HCT 37.0 (L) 39.0 - 52.0 %   MCV 91.4 78.0 - 100.0 fL   MCH 32.6 26.0 - 34.0 pg   MCHC 35.7 30.0 - 36.0 g/dL   RDW 13.5 11.5 - 15.5 %   Platelets 132 (L) 150 - 400 K/uL   Neutrophils Relative % 70 %   Neutro Abs 4.3 1.7 - 7.7 K/uL   Lymphocytes Relative 11 %   Lymphs Abs 0.7 0.7 - 4.0 K/uL   Monocytes Relative 14 %   Monocytes Absolute 0.8 0.1 - 1.0 K/uL   Eosinophils Relative 4 %   Eosinophils Absolute 0.2 0.0 - 0.7 K/uL   Basophils Relative 1 %   Basophils Absolute 0.1 0.0 - 0.1 K/uL    Comment: Performed at [facility], [place] [address] [place], [place] [address]  I-Stat CG4 Lactic Acid, ED     Status: None   Collection Time: 04/22/17  9:55 PM  Result Value Ref Range   Lactic Acid, Venous 1.89 0.5 - 1.9 mmol/L  Protime-INR     Status: None   Collection Time: 04/22/17  9:59 PM  Result Value Ref Range   Prothrombin Time 14.1 11.4 - 15.2 seconds   INR 1.10     Comment: Performed at [facility], [place] [address], [place], [place] [address]  Ethanol     Status: None    Collection Time: 04/22/17  9:59 PM  Result Value Ref Range   Alcohol, Ethyl (B) <10 <10 mg/dL    Comment:        LOWEST DETECTABLE LIMIT FOR SERUM ALCOHOL IS 10 mg/dL FOR MEDICAL PURPOSES ONLY Performed at [place] [address], [place], [place] [address]   Rapid urine drug screen (hospital performed)     Status: Abnormal   Collection Time: 04/22/17 11:52 PM  Result Value Ref Range   Opiates NONE DETECTED NONE DETECTED   Cocaine NONE DETECTED NONE DETECTED   Benzodiazepines NONE DETECTED NONE DETECTED   Amphetamines POSITIVE (A) NONE DETECTED   Tetrahydrocannabinol POSITIVE (A) NONE DETECTED   Barbiturates NONE DETECTED NONE DETECTED    Comment: (NOTE) DRUG SCREEN FOR MEDICAL PURPOSES ONLY.  IF CONFIRMATION IS NEEDED FOR ANY PURPOSE, NOTIFY LAB WITHIN 5 DAYS. LOWEST DETECTABLE LIMITS FOR URINE DRUG SCREEN Drug Class                     Cutoff (ng/mL) Amphetamine and metabolites    1000 Barbiturate and metabolites    200 Benzodiazepine                 130 Tricyclics and metabolites     300 Opiates and metabolites        300 Cocaine and metabolites        300 THC                            50 Performed at [facility], [place] [address] [place], [place] [address]   Urinalysis, Routine w reflex microscopic     Status: Abnormal   Collection Time: 04/22/17 11:52 PM  Result Value Ref Range   Color, Urine AMBER (A) YELLOW    Comment: BIOCHEMICALS MAY BE AFFECTED BY COLOR   APPearance CLEAR CLEAR   Specific Gravity, Urine 1.027 1.005 - 1.030   pH 5.0 5.0 - 8.0   Glucose, UA NEGATIVE NEGATIVE mg/dL   Hgb urine dipstick NEGATIVE NEGATIVE  Bilirubin Urine SMALL (A) NEGATIVE   Ketones, ur NEGATIVE NEGATIVE mg/dL   Protein, ur NEGATIVE NEGATIVE mg/dL   Nitrite NEGATIVE NEGATIVE   Leukocytes, UA TRACE (A) NEGATIVE   RBC / HPF 0-5 0 - 5 RBC/hpf   WBC, UA 6-30 0 - 5 WBC/hpf   Bacteria, UA RARE (A) NONE SEEN   Squamous Epithelial /  LPF 0-5 (A) NONE SEEN   Mucus PRESENT     Comment: Performed at [facility], [place] [address], [place], [place] [address]  Stat Gram stain     Status: None   Collection Time: 04/22/17 11:54 PM  Result Value Ref Range   Specimen Description ARM    Special Requests NONE    Gram Stain      NO WBC SEEN MODERATE GRAM POSITIVE COCCI IN CHAINS IN CLUSTERS RESULT CALLED TO, READ BACK BY AND VERIFIED WITH: S HEDG[place]OCK,RN _0  04/23/17 MKELLY Performed at [facility], [place] [address], [place], [place] [address]    Report Status 04/23/2017 FINAL   Ammonia     Status: Abnormal   Collection Time: 04/23/17 12:20 AM  Result Value Ref Range   Ammonia 74 (H) 9 - 35 umol/L    Comment: Performed at [facility], [place] [address] [place], [place] [address]   No results found.  Pending Labs Unresulted Labs (From admission, onward)   Start     Ordered   04/23/17 0016  Aerobic Culture (superficial specimen)  Once,   R     04/23/17 0015   04/22/17 2058  RPR  (STD Panel (GC, Chlamydia, RPR, HIV) (pnl))  Once,   STAT     04/22/17 2057   04/22/17 2058  HIV antibody  (STD Panel (GC, Chlamydia, RPR, HIV) (pnl))  Once,   STAT     04/22/17 2057   04/22/17 2032  Blood culture (routine x 2)  BLOOD CULTURE X 2,   STAT     04/22/17 2031      Vitals/Pain Today's Vitals   04/23/17 1100 04/23/17 1108 04/23/17 1228 04/23/17 1230  BP: 123/84   112/81  Pulse: 99   88  Resp: (!) 24   (!) 22  Temp:      TempSrc:      SpO2: 96%   97%  Weight:      Height:      PainSc:  8  8      Isolation Precautions No active isolations  Medications Medications  acetaminophen (TYLENOL) tablet 650 mg (not administered)    Or  acetaminophen (TYLENOL) suppository 650 mg (not administered)  ondansetron (ZOFRAN) tablet 4 mg ( Oral See Alternative 04/23/17 1025)    Or  ondansetron (ZOFRAN) injection 4 mg (4 mg Intravenous Given 04/23/17 1025)  enoxaparin (LOVENOX)  injection 40 mg (40 mg Subcutaneous Given 04/23/17 1109)  vancomycin (VANCOCIN) IVPB 1000 mg/200 mL premix (1,000 mg Intravenous Not Given 04/23/17 0834)  HYDROmorphone (DILAUDID) injection 1 mg (1 mg Intravenous Given 04/23/17 1024)  ondansetron (ZOFRAN) injection 4 mg (4 mg Intravenous Given 04/22/17 2152)  piperacillin-tazobactam (ZOSYN) IVPB 3.375 g (0 g Intravenous Stopped 04/22/17 2257)  vancomycin (VANCOCIN) 2,000 mg in sodium chloride 0.9 % 500 mL IVPB (0 mg Intravenous Stopped 04/23/17 0201)  lidocaine (PF) (XYLOCAINE) 1 % injection 30 mL (30 mLs Intradermal Given 04/22/17 2211)  HYDROmorphone (DILAUDID) injection 0.5 mg (0.5 mg Intravenous Given 04/22/17 2152)  cefTRIAXone (ROCEPHIN) 1 g in sodium chloride 0.9 % 100 mL IVPB (  0 g Intravenous Stopped 04/23/17 0205)  HYDROmorphone (DILAUDID) injection 1 mg (1 mg Intravenous Given 04/22/17 2351)    Mobility walks

## 2017-04-23 NOTE — Progress Notes (Signed)
Pharmacy Antibiotic Note  Jonathan CanterburyGary Sheldon Spurr is a 52 y.o. male admitted on 04/22/2017 with cellulitis/abscess of arm.  Pharmacy has been consulted for vancomycin dosing.  Plan: Vancomycin 2 Gm x1 then 1 Gm IV q8h for AUC = 429 Goal AUC=400-500 F/u scr/cultures/levels  Height: 6\' 3"  (190.5 cm) Weight: 245 lb (111.1 kg) IBW/kg (Calculated) : 84.5  Temp (24hrs), Avg:98.4 F (36.9 C), Min:98.4 F (36.9 C), Max:98.4 F (36.9 C)  Recent Labs  Lab 04/21/17 1358 04/21/17 1430 04/22/17 1617 04/22/17 2155  WBC 10.4  --  6.1  --   CREATININE 0.79  --  0.69  --   LATICACIDVEN  --  1.51  --  1.89    Estimated Creatinine Clearance: 148.6 mL/min (by C-G formula based on SCr of 0.69 mg/dL).    Allergies  Allergen Reactions  . Tylenol [Acetaminophen] Other (See Comments)    "Not good for liver"    Antimicrobials this admission: 2/22 zosyn >>  2/22 vancomycin >>   Dose adjustments this admission:   Microbiology results:  BCx:   UCx:    Sputum:    MRSA PCR:   Thank you for allowing pharmacy to be a part of this patient's care.  Lorenza EvangelistGreen, Andras Grunewald R 04/23/2017 12:00 AM

## 2017-04-23 NOTE — Progress Notes (Signed)
[facility]                                                                              Patient Demographics  Jonathan Ibarra, is a 52 y.o. male, DOB - Jun 06, 1965, HQI:[id number]  Admit date - 04/22/2017   Admitting Physician No admitting provider for patient encounter.  Outpatient Primary MD for the patient is Candi Leash, MD  Outpatient specialists:   LOS - 0  days    Chief Complaint  Patient presents with  . Arm Swelling       Brief summary  Jonathan Ibarra is a 52 y.o. male with medical history significant for but not limited to  prior EtOH abuse, cirrhosis of the liver with treated HCV presenting with severe left arm redness and swelling with purulence. He denies any history of IV drug use. Patient admitted for left upper extremity cellulitis   Assessment & Plan    Principal Problem:   Cellulitis of left arm Active Problems:   Hepatic cirrhosis (HCC)   S/P TIPS (transjugular intrahepatic portosystemic shunt)   Abscess of left arm  #1 left upper estimate cellulitis/abscess: Status post I&D by EDP Gram stain culture with gram-positive cocci, suspect community acquired MRSA infection Cultures pending-follow IV antibiotic-vancomycin, hold Zosyn for now: Pain control Supportive care  #2 hepatic cirrhosis with history of treated hepatitis C: Status post TIPS Monitor associated thrombocytopenia and hyperbilirubinemia per pcp    Code Status: Full code DVT Prophylaxis:  Lovenox  Family Communication: Discussed in detail with the patient, all imaging results, lab results explained to the patient Disposition Plan: Home  Time Spent in minutes  35  minutes  Procedures:  I&D. by EDP  Consultants:     Antimicrobials:   IV vancomycin   Medications  Scheduled Meds: . enoxaparin (LOVENOX) injection  40 mg Subcutaneous Daily   Continuous Infusions: . vancomycin     PRN Meds:.acetaminophen **OR** acetaminophen, HYDROmorphone (DILAUDID)  injection, ondansetron **OR** ondansetron (ZOFRAN) IV   Antibiotics   Anti-infectives (From admission, onward)   Start     Dose/Rate Route Frequency Ordered Stop   04/23/17 0800  vancomycin (VANCOCIN) IVPB 1000 mg/200 mL premix     1,000 mg 200 mL/hr over 60 Minutes Intravenous Every 8 hours 04/23/17 0033     04/22/17 2315  cefTRIAXone (ROCEPHIN) 1 g in sodium chloride 0.9 % 100 mL IVPB     1 g 200 mL/hr over 30 Minutes Intravenous  Once 04/22/17 2311 04/23/17 0205   04/22/17 2100  piperacillin-tazobactam (ZOSYN) IVPB 3.375 g     3.375 g 100 mL/hr over 30 Minutes Intravenous  Once 04/22/17 2048 04/22/17 2257   04/22/17 2100  vancomycin (VANCOCIN) IVPB 1000 mg/200 mL premix  Status:  Discontinued     1,000 mg 200 mL/hr over 60 Minutes Intravenous  Once 04/22/17 2048 04/22/17 2049   04/22/17 2100  vancomycin (VANCOCIN) 2,000 mg in sodium chloride 0.9 % 500 mL IVPB     2,000 mg 250 mL/hr over 120 Minutes Intravenous  Once 04/22/17 2049 04/23/17 0201        Subjective:   Keymani Mclean was seen and examined  today. Patient was sent rest the ED this morning. Check medication reviewed. Denies a fever or chills. Complains of left upper extremity to pain  Objective:   Vitals:   04/23/17 0830 04/23/17 1031 04/23/17 1100 04/23/17 1230  BP: 117/76 112/64 123/84 112/81  Pulse: 100 (!) 105 99 88  Resp: 17 (!) 21 (!) 24 (!) 22  Temp:      TempSrc:      SpO2: 100% 94% 96% 97%  Weight:      Height:        Intake/Output Summary (Last 24 hours) at 04/23/2017 1423 Last data filed at 04/23/2017 0216 Gross per 24 hour  Intake 1150 ml  Output -  Net 1150 ml     Wt Readings from Last 3 Encounters:  04/22/17 111.1 kg (245 lb)  04/21/17 111.1 kg (245 lb)  02/07/15 108.7 kg (239 lb 9.6 oz)     Exam  General: NAD  HEENT: NCAT,  PERRL,MMM  Neck: SUPPLE, (-) JVD  Cardiovascular: RRR, (-) GALLOP, (-) MURMUR  Respiratory: CTA  Gastrointestinal: SOFT, (-) DISTENSION, BS(+), (_)  TENDERNESS  Ext: (-) CYANOSIS, (-) EDEMA  Neuro: A, OX 3  Skin: left upper estimate date-extensive aortic redness and induration, warm to touch and tender to palpation, fluctuant   Psych:NORMAL AFFECT/MOOD   Data Reviewed:  I have personally reviewed following labs and imaging studies  Micro Results Recent Results (from the past 240 hour(s))  Stat Gram stain     Status: None   Collection Time: 04/22/17 11:54 PM  Result Value Ref Range Status   Specimen Description ARM  Final   Special Requests NONE  Final   Gram Stain   Final    NO WBC SEEN MODERATE GRAM POSITIVE COCCI IN CHAINS IN CLUSTERS RESULT CALLED TO, READ BACK BY AND VERIFIED WITH: S HEDGECOCK,RN @0227  04/23/17 MKELLY Performed at [facility], [address], [place], [place] [address]    Report Status 04/23/2017 FINAL  Final    Radiology Reports No results found.  Lab Data:  CBC: Recent Labs  Lab 04/21/17 1358 04/22/17 1617  WBC 10.4 6.1  NEUTROABS 8.1* 4.3  HGB 14.5 13.2  HCT 40.6 37.0*  MCV 92.7 91.4  PLT 121* 132*   Basic Metabolic Panel: Recent Labs  Lab 04/21/17 1358 04/22/17 1617  NA 132* 134*  K 3.6 3.2*  CL 101 102  CO2 23 24  GLUCOSE 127* 146*  BUN 11 11  CREATININE 0.79 0.69  CALCIUM 8.5* 8.2*   GFR: Estimated Creatinine Clearance: 148.6 mL/min (by C-G formula based on SCr of 0.69 mg/dL). Liver Function Tests: Recent Labs  Lab 04/21/17 1358 04/22/17 1617  AST 19 25  ALT 18 20  ALKPHOS 88 98  BILITOT 2.2* 1.5*  PROT 6.4* 6.3*  ALBUMIN 3.2* 2.9*   No results for input(s): LIPASE, AMYLASE in the last 168 hours. Recent Labs  Lab 04/23/17 0020  AMMONIA 74*   Coagulation Profile: Recent Labs  Lab 04/22/17 2159  INR 1.10   Cardiac Enzymes: No results for input(s): CKTOTAL, CKMB, CKMBINDEX, TROPONINI in the last 168 hours. BNP (last 3 results) No results for input(s): PROBNP in the last 8760 hours. HbA1C: No results for input(s): HGBA1C in the  last 72 hours. CBG: No results for input(s): GLUCAP in the last 168 hours. Lipid Profile: No results for input(s): CHOL, HDL, LDLCALC, TRIG, CHOLHDL, LDLDIRECT in the last 72 hours. Thyroid Function Tests: No results for input(s): TSH, T4TOTAL, FREET4, T3FREE,  THYROIDAB in the last 72 hours. Anemia Panel: No results for input(s): VITAMINB12, FOLATE, FERRITIN, TIBC, IRON, RETICCTPCT in the last 72 hours. Urine analysis:    Component Value Date/Time   COLORURINE AMBER (A) 04/22/2017 2352   APPEARANCEUR CLEAR 04/22/2017 2352   LABSPEC 1.027 04/22/2017 2352   PHURINE 5.0 04/22/2017 2352   GLUCOSEU NEGATIVE 04/22/2017 2352   HGBUR NEGATIVE 04/22/2017 2352   BILIRUBINUR SMALL (A) 04/22/2017 2352   KETONESUR NEGATIVE 04/22/2017 2352   PROTEINUR NEGATIVE 04/22/2017 2352   NITRITE NEGATIVE 04/22/2017 2352   LEUKOCYTESUR TRACE (A) 04/22/2017 2352     OSEI-BONSU,Wilferd Ritson M.D. [facility] 04/23/2017, 2:23 PM  Pager: [phone] Between 7am to 7pm - call Pager - [phone]  After 7pm go to [email or web address] - password [email or web address]  Call night coverage person covering after 7pm

## 2017-04-23 NOTE — Plan of Care (Signed)
Plan of care 

## 2017-04-23 NOTE — H&P (Signed)
History and Physical    Jonathan CanterburyGary Sheldon Ibarra AVW:[id number] DOB: 1965-06-27 DOA: 04/22/2017  PCP: Jonathan Ibarra, Anthony, MD  Patient coming from: Home  I have personally briefly reviewed patient[facility]  Chief Complaint: Arm swelling  HPI: Jonathan CanterburyGary Sheldon Ibarra is a 52 y.o. male with medical history significant of prior EtOH abuse, cirrhosis, HTN, treated HCV.  Patient presents to the ED with c/o severe L arm redness, swellilng, pain purulent discharge.  Symptoms onset after getting a tattoo x5 days ago.  He endorses some drug use but has never done IV drugs he says.  Pain is severe, 10/10.   ED Course: I+D done in ED with purulent drainage.  Put on zosyn / vanc.   Review of Systems: As per HPI otherwise 10 point review of systems negative.   Past Medical History:  Diagnosis Date  . Alcohol abuse   . Cirrhosis of liver (HCC)   . Hypertension     History reviewed. No pertinent surgical history.   reports that he has been smoking.  He has been smoking about 0.25 packs per day. He does not have any smokeless tobacco history on file. He reports that he uses drugs. Drugs: Cocaine and Marijuana. He reports that he does not drink alcohol.  Allergies  Allergen Reactions  . Tylenol [Acetaminophen] Other (See Comments)    "Not good for liver"    No family history on file.   Prior to Admission medications   Medication Sig Start Date End Date Taking? Authorizing Provider  furosemide (LASIX) 40 MG tablet Take 1 tablet (40 mg total) by mouth daily. Patient not taking: Reported on 04/22/2017 02/07/15   Clydia LlanoElmahi, Mutaz, MD    Physical Exam: Vitals:   04/22/17 1613 04/22/17 1928 04/22/17 2129 04/22/17 2330  BP:  127/81 (!) 146/101 130/77  Pulse:  97 89 (!) 102  Resp:  20 17 18   Temp:      TempSrc:      SpO2:  100% 100% 100%  Weight: 111.1 kg (245 lb)     Height: 6\' 3"  (1.905 m)       Constitutional: NAD, calm, comfortable Eyes: PERRL, lids and  conjunctivae normal ENMT: Mucous membranes are moist. Posterior pharynx clear of any exudate or lesions.Normal dentition.  Neck: normal, supple, no masses, no thyromegaly Respiratory: clear to auscultation bilaterally, no wheezing, no crackles. Normal respiratory effort. No accessory muscle use.  Cardiovascular: Regular rate and rhythm, no murmurs / rubs / gallops. No extremity edema. 2+ pedal pulses. No carotid bruits.  Abdomen: no tenderness, no masses palpated. No hepatosplenomegaly. Bowel sounds positive.  Musculoskeletal: no clubbing / cyanosis. No joint deformity upper and lower extremities. Good ROM, no contractures. Normal muscle tone.  Skin:        Neurologic: CN 2-12 grossly intact. Sensation intact, DTR normal. Strength 5/5 in all 4.  Psychiatric: Normal judgment and insight. Alert and oriented x 3. Normal mood.    Labs on Admission: I have personally reviewed following labs and imaging studies  CBC: Recent Labs  Lab 04/21/17 1358 04/22/17 1617  WBC 10.4 6.1  NEUTROABS 8.1* 4.3  HGB 14.5 13.2  HCT 40.6 37.0*  MCV 92.7 91.4  PLT 121* 132*   Basic Metabolic Panel: Recent Labs  Lab 04/21/17 1358 04/22/17 1617  NA 132* 134*  K 3.6 3.2*  CL 101 102  CO2 23 24  GLUCOSE 127* 146*  BUN 11 11  CREATININE 0.79 0.69  CALCIUM 8.5* 8.2*  GFR: Estimated Creatinine Clearance: 148.6 mL/min (by C-G formula based on SCr of 0.69 mg/dL). Liver Function Tests: Recent Labs  Lab 04/21/17 1358 04/22/17 1617  AST 19 25  ALT 18 20  ALKPHOS 88 98  BILITOT 2.2* 1.5*  PROT 6.4* 6.3*  ALBUMIN 3.2* 2.9*   No results for input(s): LIPASE, AMYLASE in the last 168 hours. No results for input(s): AMMONIA in the last 168 hours. Coagulation Profile: Recent Labs  Lab 04/22/17 2159  INR 1.10   Cardiac Enzymes: No results for input(s): CKTOTAL, CKMB, CKMBINDEX, TROPONINI in the last 168 hours. BNP (last 3 results) No results for input(s): PROBNP in the last 8760  hours. HbA1C: No results for input(s): HGBA1C in the last 72 hours. CBG: No results for input(s): GLUCAP in the last 168 hours. Lipid Profile: No results for input(s): CHOL, HDL, LDLCALC, TRIG, CHOLHDL, LDLDIRECT in the last 72 hours. Thyroid Function Tests: No results for input(s): TSH, T4TOTAL, FREET4, T3FREE, THYROIDAB in the last 72 hours. Anemia Panel: No results for input(s): VITAMINB12, FOLATE, FERRITIN, TIBC, IRON, RETICCTPCT in the last 72 hours. Urine analysis:    Component Value Date/Time   COLORURINE AMBER (A) 02/04/2015 1441   APPEARANCEUR CLEAR 02/04/2015 1441   LABSPEC 1.018 02/04/2015 1441   PHURINE 7.5 02/04/2015 1441   GLUCOSEU NEGATIVE 02/04/2015 1441   HGBUR NEGATIVE 02/04/2015 1441   BILIRUBINUR NEGATIVE 02/04/2015 1441   KETONESUR NEGATIVE 02/04/2015 1441   PROTEINUR NEGATIVE 02/04/2015 1441   NITRITE NEGATIVE 02/04/2015 1441   LEUKOCYTESUR NEGATIVE 02/04/2015 1441    Radiological Exams on Admission: No results found.  EKG: Independently reviewed.  Assessment/Plan Principal Problem:   Cellulitis of left arm Active Problems:   Hepatic cirrhosis (HCC)   S/P TIPS (transjugular intrahepatic portosystemic shunt)   Abscess of left arm    1. Cellulitis and abscess of L arm - 1. I+D done by EDP 2. Ordering cultures and GS 3. Will hold zosyn and just leave him on Vanc (clinically suspect a community acquired MRSA infection), unless GS shows GN. 4. Pain control with Oxy PO 2. Cirrhosis s/p TIPS 1. Not seeing a physician currently, not currently on any diuretics nor lactulose nor rifaxan. 2. History of hepatic encephalopathy in past, but no evidence of acute hepatic encephalopathy today. 3. Will check ammonia 4. Will hold off on ordering new meds for now given lack of symptoms currently. 5. Definitely needs GI follow up - risk of varices, etc.  DVT prophylaxis: Lovenox Code Status: Full Family Communication: No family in room Disposition Plan: Home  after admit Consults called: None Admission status: Place in obs   Jonathan Ibarra, Jonathan Ibarra. DO [facility] Pager [phone]  If 7AM-7PM, please contact day team taking care of patient [email or web address] Password [email or web address]  04/23/2017, 12:12 AM

## 2017-04-23 NOTE — ED Notes (Signed)
RN went into pt room to inquire about pain level. Pt was asleep. I woke pt to check to see if pt needed anything else. Pt woke up and stated his pain went down and then shot back up. I informed pt I can only give pt pain medication every 4 hours when pt request pain med. Pt understood and closed his eyes.

## 2017-04-23 NOTE — Progress Notes (Signed)
Dressing to left arm with stain through coban. Patient complains feeling tight. Coban dressing removed and dry gauze, abd dressing kerlex applied.  Sharrell Ku Kirsi Hugh RN

## 2017-04-24 NOTE — Plan of Care (Signed)
  Nutrition: Adequate nutrition will be maintained 04/24/2017 1300 - Progressing by William Daltonarpenter, Christiano Blandon L, RN   Elimination: Will not experience complications related to bowel motility 04/24/2017 1300 - Progressing by William Daltonarpenter, Tayli Buch L, RN   Pain Managment: General experience of comfort will improve 04/24/2017 1300 - Progressing by William Daltonarpenter, Emmalia Heyboer L, RN

## 2017-04-24 NOTE — Progress Notes (Signed)
[facility]                                                                              Patient Demographics  Jonathan Ibarra, is a 52 y.o. male, DOB - [birth year], WUJ:[id number]  Admit date - 04/22/2017   Admitting Physician Hillary BowJared M Gardner, DO  Outpatient Primary MD for the patient is Candi LeashHayes, Anthony, MD  Outpatient specialists:   LOS - 1  days    Chief Complaint  Patient presents with  . Arm Swelling       Brief summary  Jonathan Ibarra is a 52 y.o. male with medical history significant for but not limited to  prior EtOH abuse, cirrhosis of the liver with treated HCV presenting with severe left arm redness and swelling with purulence. He denies any history of IV drug use. Patient admitted for left upper extremity cellulitis   Assessment & Plan    Principal Problem:   Cellulitis of left arm Active Problems:   Hepatic cirrhosis (HCC)   S/P TIPS (transjugular intrahepatic portosystemic shunt)   Abscess of left arm   Cellulitis  #1 left upper estimate cellulitis/abscess: Status post I&D by EDP Gram stain culture with gram-positive cocci, suspect community acquired MRSA infection Cultures pending-follow WOC IV antibiotic-vancomycin, hold Zosyn for now: Pain control Supportive care  #2 hepatic cirrhosis with history of treated hepatitis C: Status post TIPS Monitor associated thrombocytopenia and hyperbilirubinemia per pcp    Code Status: Full code DVT Prophylaxis:  Lovenox  Family Communication: Discussed in detail with the patient, all imaging results, lab results explained to the patient Disposition Plan: Home  Time Spent in minutes  35  minutes  Procedures:  I&D. by EDP  Consultants:     Antimicrobials:   IV vancomycin   Medications  Scheduled Meds: . enoxaparin (LOVENOX) injection  40 mg Subcutaneous Daily  . potassium chloride  20 mEq Oral Daily   Continuous Infusions: . vancomycin Stopped (04/24/17 0903)   PRN  Meds:.acetaminophen **OR** acetaminophen, HYDROmorphone (DILAUDID) injection, ondansetron **OR** ondansetron (ZOFRAN) IV   Antibiotics   Anti-infectives (From admission, onward)   Start     Dose/Rate Route Frequency Ordered Stop   04/23/17 0800  vancomycin (VANCOCIN) IVPB 1000 mg/200 mL premix     1,000 mg 200 mL/hr over 60 Minutes Intravenous Every 8 hours 04/23/17 0033     04/22/17 2315  cefTRIAXone (ROCEPHIN) 1 g in sodium chloride 0.9 % 100 mL IVPB     1 g 200 mL/hr over 30 Minutes Intravenous  Once 04/22/17 2311 04/23/17 0205   04/22/17 2100  piperacillin-tazobactam (ZOSYN) IVPB 3.375 g     3.375 g 100 mL/hr over 30 Minutes Intravenous  Once 04/22/17 2048 04/22/17 2257   04/22/17 2100  vancomycin (VANCOCIN) IVPB 1000 mg/200 mL premix  Status:  Discontinued     1,000 mg 200 mL/hr over 60 Minutes Intravenous  Once 04/22/17 2048 04/22/17 2049   04/22/17 2100  vancomycin (VANCOCIN) 2,000 mg in sodium chloride 0.9 % 500 mL IVPB     2,000 mg 250 mL/hr over 120 Minutes Intravenous  Once 04/22/17 2049 04/23/17 0201  Subjective:   Jonathan Ibarra was seen and examined today. Denies a fever or chills. Pain better Objective:   Vitals:   04/23/17 1430 04/23/17 1530 04/23/17 2255 04/24/17 0550  BP: 121/72 121/72 117/67 110/68  Pulse: 87 87 80 68  Resp: (!) 21 (!) 21 19 14   Temp: 97.9 F (36.6 C) 97.9 F (36.6 C) 97.6 F (36.4 C) 98.3 F (36.8 C)  TempSrc: Oral Oral Oral Oral  SpO2: 100% 100% 100% 100%  Weight:  111.1 kg (245 lb)    Height:  6\' 3"  (1.905 m)      Intake/Output Summary (Last 24 hours) at 04/24/2017 1239 Last data filed at 04/24/2017 0551 Gross per 24 hour  Intake 1355 ml  Output -  Net 1355 ml     Wt Readings from Last 3 Encounters:  04/23/17 111.1 kg (245 lb)  04/21/17 111.1 kg (245 lb)  02/07/15 108.7 kg (239 lb 9.6 oz)     Exam  General: NAD  HEENT: NCAT,  PERRL,MMM  Neck: SUPPLE, (-) JVD  Cardiovascular: RRR, (-) GALLOP, (-)  MURMUR  Respiratory: CTA  Gastrointestinal: SOFT, (-) DISTENSION, BS(+), (_) TENDERNESS  Ext: (-) CYANOSIS, (-) EDEMA  Neuro: A, OX 3  Skin: left upper estimate date-extensive aortic redness and induration, warm to touch and tender to palpation, fluctuant   Psych:NORMAL AFFECT/MOOD   Data Reviewed:  I have personally reviewed following labs and imaging studies  Micro Results Recent Results (from the past 240 hour(s))  Stat Gram stain     Status: None   Collection Time: 04/22/17 11:54 PM  Result Value Ref Range Status   Specimen Description ARM  Final   Special Requests NONE  Final   Gram Stain   Final    NO WBC SEEN MODERATE GRAM POSITIVE COCCI IN CHAINS IN CLUSTERS RESULT CALLED TO, READ BACK BY AND VERIFIED WITH: S HEDGECOCK,RN @0227  04/23/17 MKELLY Performed at [facility], [address], [place], [place] [address]    Report Status 04/23/2017 FINAL  Final  Aerobic Culture (superficial specimen)     Status: None (Preliminary result)   Collection Time: 04/23/17 12:16 AM  Result Value Ref Range Status   Specimen Description   Final    ARM Performed at [facility], [address] [place], [place] [address]    Special Requests   Final    NONE Performed at [facility], [address], [place], [place] [address]    Gram Stain PENDING  Incomplete   Culture   Final    ABUNDANT STAPHYLOCOCCUS AUREUS SUSCEPTIBILITIES TO FOLLOW Performed at [facility], [address], [place], [place] [address]    Report Status PENDING  Incomplete    Radiology Reports No results found.  Lab Data:  CBC: Recent Labs  Lab 04/21/17 1358 04/22/17 1617  WBC 10.4 6.1  NEUTROABS 8.1* 4.3  HGB 14.5 13.2  HCT 40.6 37.0*  MCV 92.7 91.4  PLT 121* 132*   Basic Metabolic Panel: Recent Labs  Lab 04/21/17 1358 04/22/17 1617  NA 132* 134*  K 3.6 3.2*  CL 101 102  CO2 23 24  GLUCOSE 127* 146*  BUN 11 11   CREATININE 0.79 0.69  CALCIUM 8.5* 8.2*   GFR: Estimated Creatinine Clearance: 148.6 mL/min (by C-G formula based on SCr of 0.69 mg/dL). Liver Function Tests: Recent Labs  Lab 04/21/17 1358 04/22/17 1617  AST 19 25  ALT 18 20  ALKPHOS 88 98  BILITOT 2.2* 1.5*  PROT 6.4* 6.3*  ALBUMIN 3.2* 2.9*   No results for input(s): LIPASE, AMYLASE in the last 168 hours. Recent Labs  Lab 04/23/17 0020  AMMONIA 74*   Coagulation Profile: Recent Labs  Lab 04/22/17 2159  INR 1.10   Cardiac Enzymes: No results for input(s): CKTOTAL, CKMB, CKMBINDEX, TROPONINI in the last 168 hours. BNP (last 3 results) No results for input(s): PROBNP in the last 8760 hours. HbA1C: No results for input(s): HGBA1C in the last 72 hours. CBG: No results for input(s): GLUCAP in the last 168 hours. Lipid Profile: No results for input(s): CHOL, HDL, LDLCALC, TRIG, CHOLHDL, LDLDIRECT in the last 72 hours. Thyroid Function Tests: No results for input(s): TSH, T4TOTAL, FREET4, T3FREE, THYROIDAB in the last 72 hours. Anemia Panel: No results for input(s): VITAMINB12, FOLATE, FERRITIN, TIBC, IRON, RETICCTPCT in the last 72 hours. Urine analysis:    Component Value Date/Time   COLORURINE AMBER (A) 04/22/2017 2352   APPEARANCEUR CLEAR 04/22/2017 2352   LABSPEC 1.027 04/22/2017 2352   PHURINE 5.0 04/22/2017 2352   GLUCOSEU NEGATIVE 04/22/2017 2352   HGBUR NEGATIVE 04/22/2017 2352   BILIRUBINUR SMALL (A) 04/22/2017 2352   KETONESUR NEGATIVE 04/22/2017 2352   PROTEINUR NEGATIVE 04/22/2017 2352   NITRITE NEGATIVE 04/22/2017 2352   LEUKOCYTESUR TRACE (A) 04/22/2017 2352     OSEI-BONSU,Dominque Marlin M.D. [facility] 04/24/2017, 12:39 PM  Pager: [phone] Between 7am to 7pm - call Pager - [phone]  After 7pm go to [email or web address] - password [email or web address]  Call night coverage person covering after 7pm

## 2017-04-25 LAB — CBC
HCT: 36.3 % — ABNORMAL LOW (ref 39.0–52.0)
Hemoglobin: 12.6 g/dL — ABNORMAL LOW (ref 13.0–17.0)
MCH: 33 pg (ref 26.0–34.0)
MCHC: 34.7 g/dL (ref 30.0–36.0)
MCV: 95 fL (ref 78.0–100.0)
Platelets: 143 10*3/uL — ABNORMAL LOW (ref 150–400)
RBC: 3.82 MIL/uL — ABNORMAL LOW (ref 4.22–5.81)
RDW: 13.5 % (ref 11.5–15.5)
WBC: 3.3 10*3/uL — ABNORMAL LOW (ref 4.0–10.5)

## 2017-04-25 LAB — COMPREHENSIVE METABOLIC PANEL
ALT: 27 U/L (ref 17–63)
AST: 33 U/L (ref 15–41)
Albumin: 2.7 g/dL — ABNORMAL LOW (ref 3.5–5.0)
Alkaline Phosphatase: 101 U/L (ref 38–126)
Anion gap: 7 (ref 5–15)
BUN: 10 mg/dL (ref 6–20)
CHLORIDE: 101 mmol/L (ref 101–111)
CO2: 28 mmol/L (ref 22–32)
CREATININE: 0.65 mg/dL (ref 0.61–1.24)
Calcium: 8.5 mg/dL — ABNORMAL LOW (ref 8.9–10.3)
GFR calc Af Amer: >60 mL/min (ref >60)
Glucose, Bld: 126 mg/dL — ABNORMAL HIGH (ref 65–99)
POTASSIUM: 4.1 mmol/L (ref 3.5–5.1)
SODIUM: 136 mmol/L (ref 135–145)
Total Bilirubin: 0.6 mg/dL (ref 0.3–1.2)
Total Protein: 6.1 g/dL — ABNORMAL LOW (ref 6.5–8.1)

## 2017-04-25 LAB — RPR

## 2017-04-25 MED ORDER — OXYCODONE HCL 5 MG PO TABS
5.0000 mg | ORAL_TABLET | Freq: Once | ORAL | Status: AC
  Administered 2017-04-25: 21:00:00 5 mg via ORAL
  Filled 2017-04-25: qty 1

## 2017-04-25 MED ORDER — OXYCODONE HCL 5 MG PO TABS
5.0000 mg | ORAL_TABLET | ORAL | Status: DC | PRN
  Administered 2017-04-25: 20:00:00 5 mg via ORAL
  Filled 2017-04-25: qty 1

## 2017-04-25 MED ORDER — OXYCODONE HCL 5 MG PO TABS
5.0000 mg | ORAL_TABLET | Freq: Four times a day (QID) | ORAL | Status: DC | PRN
  Administered 2017-04-26 – 2017-04-28 (×10): 10 mg via ORAL
  Filled 2017-04-25 (×11): qty 2

## 2017-04-25 MED ORDER — MUPIROCIN CALCIUM 2 % EX CREA
TOPICAL_CREAM | Freq: Two times a day (BID) | CUTANEOUS | Status: DC
  Administered 2017-04-25 – 2017-04-28 (×7): via TOPICAL
  Filled 2017-04-25 (×4): qty 15

## 2017-04-25 NOTE — Progress Notes (Signed)
PROGRESS NOTE [facility]   Jonathan Ibarra   ZOX:[id number] DOB: 02/05/1966  DOA: 04/22/2017 PCP: Candi Leash, MD   Brief Narrative:  Jonathan Ibarra is a 52 year old male with medical history significant for  liver cirrhosis, HCV, alcohol abuse who presented to the emergency department with left arm redness, swelling and draining purulence.  Patient was admitted with working diagnosis of left upper extremity cellulitis.  Patient had I&D performed by EDP, Gram stain culture with gram-positive cocci likely community-acquired MRSA.  Patient currently on vancomycin.  Of note patient had a tattoo done at the affected area 3 days prior to beginning of symptoms.  Subjective: Patient seen and examined, he reported that  swelling is significantly better, still tender.  Erythema also has improved.  Remains afebrile  Assessment & Plan: Cellulitis of left upper extremity in setting of recent tattoo Wound culture shows MRSA, sensitive to clindamycin Patient currently on vancomycin, mupirocin has been added for topical coverage of multiple lesions Currently the lesion seems to be fluctuant, will obtain soft tissue ultrasound to evaluate for further need of I&D. Continue to monitor  Hepatic cirrhosis with history of treated hepatitis C Status post TIPS Elevated ammonia, but no signs of hepatic encephalopathy Continue to monitor  DVT prophylaxis: Lovenox Code Status: Full Code  Family Communication: None at bedside  Disposition Plan: Home in 2-3 days   Consultants:   None   Procedures:   I&D 2/22  Antimicrobials: Anti-infectives (From admission, onward)   Start     Dose/Rate Route Frequency Ordered Stop   04/23/17 0800  vancomycin (VANCOCIN) IVPB 1000 mg/200 mL premix     1,000 mg 200 mL/hr over 60 Minutes Intravenous Every 8 hours 04/23/17 0033     04/22/17 2315  cefTRIAXone (ROCEPHIN) 1 g in sodium chloride 0.9 % 100 mL IVPB     1 g 200 mL/hr over 30 Minutes  Intravenous  Once 04/22/17 2311 04/23/17 0205   04/22/17 2100  piperacillin-tazobactam (ZOSYN) IVPB 3.375 g     3.375 g 100 mL/hr over 30 Minutes Intravenous  Once 04/22/17 2048 04/22/17 2257   04/22/17 2100  vancomycin (VANCOCIN) IVPB 1000 mg/200 mL premix  Status:  Discontinued     1,000 mg 200 mL/hr over 60 Minutes Intravenous  Once 04/22/17 2048 04/22/17 2049   04/22/17 2100  vancomycin (VANCOCIN) 2,000 mg in sodium chloride 0.9 % 500 mL IVPB     2,000 mg 250 mL/hr over 120 Minutes Intravenous  Once 04/22/17 2049 04/23/17 0201       Objective: Vitals:   04/24/17 1336 04/24/17 2040 04/25/17 0456 04/25/17 1350  BP: 125/72 119/85 126/71 123/75  Pulse: 71 89 85 76  Resp: 14 18 18 18   Temp: 97.7 F (36.5 C) (!) 97.4 F (36.3 C) 97.7 F (36.5 C) 97.6 F (36.4 C)  TempSrc: Oral Oral Oral Oral  SpO2: 100% 100% 97% 99%  Weight:      Height:        Intake/Output Summary (Last 24 hours) at 04/25/2017 1829 Last data filed at 04/25/2017 1351 Gross per 24 hour  Intake 1440 ml  Output -  Net 1440 ml   Filed Weights   04/22/17 1613 04/23/17 1530  Weight: 111.1 kg (245 lb) 111.1 kg (245 lb)    Examination:  General exam: Appears calm and comfortable  Respiratory system: Clear to auscultation. No wheezes,crackle or rhonchi Cardiovascular system: S1 & S2 heard, RRR. No JVD, murmurs, rubs or gallops Gastrointestinal system: Abdomen is nondistended,  soft and nontender.  Central nervous system: Alert and oriented. No focal neurological deficits. Extremities: No lower extremity edema, left upper extremity swelling. Fluctuant lesion at the L elbow Skin: LUE erythema, multiple scab lesions, tattoo with dry skin, some open lesions.  Psychiatry: Mood & affect appropriate.    Data Reviewed: I have personally reviewed following labs and imaging studies  CBC: Recent Labs  Lab 04/21/17 1358 04/22/17 1617 04/25/17 0551  WBC 10.4 6.1 3.3*  NEUTROABS 8.1* 4.3  --   HGB 14.5 13.2  12.6*  HCT 40.6 37.0* 36.3*  MCV 92.7 91.4 95.0  PLT 121* 132* 143*   Basic Metabolic Panel: Recent Labs  Lab 04/21/17 1358 04/22/17 1617 04/25/17 0551  NA 132* 134* 136  K 3.6 3.2* 4.1  CL 101 102 101  CO2 23 24 28   GLUCOSE 127* 146* 126*  BUN 11 11 10   CREATININE 0.79 0.69 0.65  CALCIUM 8.5* 8.2* 8.5*   GFR: Estimated Creatinine Clearance: 148.6 mL/min (by C-G formula based on SCr of 0.65 mg/dL). Liver Function Tests: Recent Labs  Lab 04/21/17 1358 04/22/17 1617 04/25/17 0551  AST 19 25 33  ALT 18 20 27   ALKPHOS 88 98 101  BILITOT 2.2* 1.5* 0.6  PROT 6.4* 6.3* 6.1*  ALBUMIN 3.2* 2.9* 2.7*   No results for input(s): LIPASE, AMYLASE in the last 168 hours. Recent Labs  Lab 04/23/17 0020  AMMONIA 74*   Coagulation Profile: Recent Labs  Lab 04/22/17 2159  INR 1.10   Cardiac Enzymes: No results for input(s): CKTOTAL, CKMB, CKMBINDEX, TROPONINI in the last 168 hours. BNP (last 3 results) No results for input(s): PROBNP in the last 8760 hours. HbA1C: No results for input(s): HGBA1C in the last 72 hours. CBG: No results for input(s): GLUCAP in the last 168 hours. Lipid Profile: No results for input(s): CHOL, HDL, LDLCALC, TRIG, CHOLHDL, LDLDIRECT in the last 72 hours. Thyroid Function Tests: No results for input(s): TSH, T4TOTAL, FREET4, T3FREE, THYROIDAB in the last 72 hours. Anemia Panel: No results for input(s): VITAMINB12, FOLATE, FERRITIN, TIBC, IRON, RETICCTPCT in the last 72 hours. Sepsis Labs: Recent Labs  Lab 04/21/17 1430 04/22/17 2155  LATICACIDVEN 1.51 1.89    Recent Results (from the past 240 hour(s))  Blood culture (routine x 2)     Status: None (Preliminary result)   Collection Time: 04/22/17  9:59 PM  Result Value Ref Range Status   Specimen Description   Final    BLOOD RIGHT FOREARM Performed at [facility], [address] [place], [place] [address]    Special Requests   Final    BOTTLES DRAWN AEROBIC AND ANAEROBIC Blood  Culture adequate volume Performed at [facility], [address] [place], [place] [address]    Culture   Final    NO GROWTH 2 DAYS Performed at [facility], [address] [place], [place] [address]    Report Status PENDING  Incomplete  Stat Gram stain     Status: None   Collection Time: 04/22/17 11:54 PM  Result Value Ref Range Status   Specimen Description ARM  Final   Special Requests NONE  Final   Gram Stain   Final    NO WBC SEEN MODERATE GRAM POSITIVE COCCI IN CHAINS IN CLUSTERS RESULT CALLED TO, READ BACK BY AND VERIFIED WITH: S HEDGECOCK,RN @0227  04/23/17 MKELLY Performed at [facility], [address], [place], [place] [address]    Report Status 04/23/2017 FINAL  Final  Aerobic Culture (superficial specimen)     Status: None (Preliminary result)   Collection Time: 04/23/17 12:16 AM  Result Value Ref Range Status   Specimen Description   Final    ARM Performed at [facility], [address] [place], [place] [address]    Special Requests   Final    NONE Performed at [facility], [address] [place], [place] [address]    Gram Stain PENDING  Incomplete   Culture ABUNDANT STAPHYLOCOCCUS AUREUS  Final   Report Status PENDING  Incomplete   Organism ID, Bacteria STAPHYLOCOCCUS AUREUS  Final      Susceptibility   Staphylococcus aureus - MIC*    CIPROFLOXACIN >=8 RESISTANT Resistant     ERYTHROMYCIN >=8 RESISTANT Resistant     GENTAMICIN <=0.5 SENSITIVE Sensitive     OXACILLIN <=0.25 SENSITIVE Sensitive     TETRACYCLINE <=1 SENSITIVE Sensitive     VANCOMYCIN 1 SENSITIVE Sensitive     TRIMETH/SULFA <=10 SENSITIVE Sensitive     CLINDAMYCIN <=0.25 SENSITIVE Sensitive     RIFAMPIN <=0.5 SENSITIVE Sensitive     Inducible Clindamycin NEGATIVE Sensitive     * ABUNDANT STAPHYLOCOCCUS AUREUS      Radiology Studies: No results found.    Scheduled Meds: . enoxaparin (LOVENOX)  injection  40 mg Subcutaneous Daily  . mupirocin cream   Topical BID  . potassium chloride  20 mEq Oral Daily   Continuous Infusions: . vancomycin 1,000 mg (04/25/17 0833)     LOS: 2 days    Time spent: Total of 25 minutes spent with pt, greater than 50% of which was spent in discussion of  treatment, counseling and coordination of care   Latrelle DodrillEdwin Silva, MD Pager: Text Page via [email or web address]   If 7PM-7AM, please contact night-coverage [email or web address] 04/25/2017, 6:29 PM   Note - This record has been created using [employer] software. Chart creation errors have been sought, but may not always have been located. Such creation errors do not reflect on the standard of medical care.

## 2017-04-25 NOTE — Consult Note (Signed)
WOC Nurse wound consult note Reason for Consult: numerous full and partial thickness lesions on the left arm from a recent tattoo placement. Patient admitted on Friday evening (2 and a half days ago) and was started on vancomycin. He reports (and bedside RN corroborates) that erythema and edema are much improved. Wound type: trauma Pressure Injury POA: NA Measurement:Numersou full thickness lesions, the largest is 0.8cm round and 0.2cm deep. A linear excoriation at the elbow, medial aspect measuring 0.1cm x 2.6cm with approximated wound edges is not related according to patient and presents with erythema and edema. Wound bed:red, dry (majority).  Two or three with moist wound bed. Drainage (amount, consistency, odor) scant serous from  Periwound: edema, mild erythema Dressing procedure/placement/frequency: As patient is on systemic antibiotics, I will add a topical mupirocin twice daily for 10 days with a dry dressing.  WOC nursing team will not follow, but will remain available to this patient, the nursing and medical teams.  Please re-consult if needed. Thanks, Ladona MowLaurie Phoebie Shad, MSN, RN, GNP, Hans EdenCWOCN, CWON-AP, FAAN  Pager# [phone]

## 2017-04-25 NOTE — Progress Notes (Addendum)
Pt refused lovenox today, he was educated in how he is a risk for blood clot and encouraged to walk the halls. Pt changed his mind and received the Lovenox shot.

## 2017-04-26 ENCOUNTER — Encounter (HOSPITAL_COMMUNITY): Payer: Self-pay | Admitting: *Deleted

## 2017-04-26 ENCOUNTER — Inpatient Hospital Stay (HOSPITAL_COMMUNITY): Payer: Self-pay

## 2017-04-26 LAB — AEROBIC CULTURE W GRAM STAIN (SUPERFICIAL SPECIMEN)

## 2017-04-26 LAB — AEROBIC CULTURE  (SUPERFICIAL SPECIMEN)

## 2017-04-26 NOTE — Progress Notes (Signed)
Pharmacy Antibiotic Note  Jonathan CanterburyGary Ibarra Jonathan Ibarra is a 52 y.o. male admitted on 04/22/2017 with cellulitis/abscess of arm.  Pharmacy has been consulted for vancomycin dosing.  04/26/2017 Scr 0.65, CrCl > 13100mls/min WBC 3.3 afebrile  Plan: - Continue Vancomycin 1 Gm IV q8h for AUC = 429 - Will not obtain vanc peak and trough at this time as pt's Scr is stable and pt not on any other nephrotoxic meds - per d/w MD anticipate de-escalation to po abx soon  Height: 6\' 3"  (190.5 cm) Weight: 245 lb (111.1 kg) IBW/kg (Calculated) : 84.5  Temp (24hrs), Avg:97.9 F (36.6 C), Min:97.6 F (36.4 C), Max:98.2 F (36.8 C)  Recent Labs  Lab 04/21/17 1358 04/21/17 1430 04/22/17 1617 04/22/17 2155 04/25/17 0551  WBC 10.4  --  6.1  --  3.3*  CREATININE 0.79  --  0.69  --  0.65  LATICACIDVEN  --  1.51  --  1.89  --     Estimated Creatinine Clearance: 148.6 mL/min (by C-G formula based on SCr of 0.65 mg/dL).    Allergies  Allergen Reactions  . Tylenol [Acetaminophen] Other (See Comments)    "Not good for liver"    Antimicrobials this admission: 2/22 zosyn >>  2/22 vancomycin >>   Dose adjustments this admission:   Microbiology results: 2/22 BCx>> 2/22 HIV>>neg 2/22 RPR>> 2/22 gram stain arm abscess: staph aureus  Thank you for allowing pharmacy to be a part of this patient's care.  Arley PhenixEllen Rachelanne Whidby RPh 04/26/2017, 12:56 PM Pager [phone]

## 2017-04-26 NOTE — Progress Notes (Addendum)
PROGRESS NOTE [facility]   Kc Summerson   ZOX:[id number] DOB: 13-Jul-1965  DOA: 04/22/2017 PCP: Candi Leash, MD   Brief Narrative:  Jonathan Ibarra is a 52 year old male with medical history significant for  liver cirrhosis, HCV, alcohol abuse who presented to the emergency department with left arm redness, swelling and draining purulence.  Patient was admitted with working diagnosis of left upper extremity cellulitis.  Patient had I&D performed by EDP, Gram stain culture with gram-positive cocci likely community-acquired MRSA.  Patient currently on vancomycin.  Of note patient had a tattoo done at the affected area 3 days prior to beginning of symptoms.  Subjective: Patient seen and examined, swelling has significantly improved. US shows no evidence of abscess. Also superficial vein non occlusive thrombose.   Assessment & Plan: Cellulitis of left upper extremity in setting of recent tattoo Wound culture shows MRSA, sensitive to clindamycin Currently on vancomycin, will continue for 24 hours more and can switch to clindamycin in a.m. If continued clinical improvement can DC home in AM  and complete antibiotic therapy as an outpatient. I have discussed with patient to avoid finishing his stent to for at least 2 months after arm is completely healed  Hepatic cirrhosis with history of treated hepatitis C Status post TIPS Elevated ammonia, but no signs of hepatic encephalopathy We will check LFTs and ammonia  Pancytopenia Likely secondary to liver cirrhosis Continue to monitor  DVT prophylaxis: Lovenox Code Status: Full Code  Family Communication: None at bedside  Disposition Plan: Home in AM   Consultants:   None   Procedures:   I&D 2/22  Antimicrobials: Anti-infectives (From admission, onward)   Start     Dose/Rate Route Frequency Ordered Stop   04/23/17 0800  vancomycin (VANCOCIN) IVPB 1000 mg/200 mL premix     1,000 mg 200 mL/hr over 60 Minutes  Intravenous Every 8 hours 04/23/17 0033     04/22/17 2315  cefTRIAXone (ROCEPHIN) 1 g in sodium chloride 0.9 % 100 mL IVPB     1 g 200 mL/hr over 30 Minutes Intravenous  Once 04/22/17 2311 04/23/17 0205   04/22/17 2100  piperacillin-tazobactam (ZOSYN) IVPB 3.375 g     3.375 g 100 mL/hr over 30 Minutes Intravenous  Once 04/22/17 2048 04/22/17 2257   04/22/17 2100  vancomycin (VANCOCIN) IVPB 1000 mg/200 mL premix  Status:  Discontinued     1,000 mg 200 mL/hr over 60 Minutes Intravenous  Once 04/22/17 2048 04/22/17 2049   04/22/17 2100  vancomycin (VANCOCIN) 2,000 mg in sodium chloride 0.9 % 500 mL IVPB     2,000 mg 250 mL/hr over 120 Minutes Intravenous  Once 04/22/17 2049 04/23/17 0201      Objective: Vitals:   04/25/17 1350 04/25/17 2215 04/26/17 0621 04/26/17 1350  BP: 123/75 121/72 (!) 111/57 123/64  Pulse: 76 82 76 82  Resp: 18 16 17 16   Temp: 97.6 F (36.4 C) 98.2 F (36.8 C) 98 F (36.7 C) (!) 97.4 F (36.3 C)  TempSrc: Oral Oral Oral Oral  SpO2: 99% 98% 95% 97%  Weight:      Height:        Intake/Output Summary (Last 24 hours) at 04/26/2017 1448 Last data filed at 04/26/2017 1350 Gross per 24 hour  Intake 1475 ml  Output -  Net 1475 ml   Filed Weights   04/22/17 1613 04/23/17 1530  Weight: 111.1 kg (245 lb) 111.1 kg (245 lb)    Examination:  General: Pt is alert, awake,  not in acute distress Cardiovascular: RRR, S1/S2 +, no rubs, no gallops Respiratory: CTA bilaterally, no wheezing, no rhonchi Abdominal: Soft, NT, ND, bowel sounds + Extremities: Left upper extremity erythema has almost resolved.  Swelling have significantly decreased.  Still some tenderness and induration around the elbow.  Data Reviewed: I have personally reviewed following labs and imaging studies  CBC: Recent Labs  Lab 04/21/17 1358 04/22/17 1617 04/25/17 0551  WBC 10.4 6.1 3.3*  NEUTROABS 8.1* 4.3  --   HGB 14.5 13.2 12.6*  HCT 40.6 37.0* 36.3*  MCV 92.7 91.4 95.0  PLT 121*  132* 143*   Basic Metabolic Panel: Recent Labs  Lab 04/21/17 1358 04/22/17 1617 04/25/17 0551  NA 132* 134* 136  K 3.6 3.2* 4.1  CL 101 102 101  CO2 23 24 28   GLUCOSE 127* 146* 126*  BUN 11 11 10   CREATININE 0.79 0.69 0.65  CALCIUM 8.5* 8.2* 8.5*   GFR: Estimated Creatinine Clearance: 148.6 mL/min (by C-G formula based on SCr of 0.65 mg/dL). Liver Function Tests: Recent Labs  Lab 04/21/17 1358 04/22/17 1617 04/25/17 0551  AST 19 25 33  ALT 18 20 27   ALKPHOS 88 98 101  BILITOT 2.2* 1.5* 0.6  PROT 6.4* 6.3* 6.1*  ALBUMIN 3.2* 2.9* 2.7*   No results for input(s): LIPASE, AMYLASE in the last 168 hours. Recent Labs  Lab 04/23/17 0020  AMMONIA 74*   Coagulation Profile: Recent Labs  Lab 04/22/17 2159  INR 1.10   Cardiac Enzymes: No results for input(s): CKTOTAL, CKMB, CKMBINDEX, TROPONINI in the last 168 hours. BNP (last 3 results) No results for input(s): PROBNP in the last 8760 hours. HbA1C: No results for input(s): HGBA1C in the last 72 hours. CBG: No results for input(s): GLUCAP in the last 168 hours. Lipid Profile: No results for input(s): CHOL, HDL, LDLCALC, TRIG, CHOLHDL, LDLDIRECT in the last 72 hours. Thyroid Function Tests: No results for input(s): TSH, T4TOTAL, FREET4, T3FREE, THYROIDAB in the last 72 hours. Anemia Panel: No results for input(s): VITAMINB12, FOLATE, FERRITIN, TIBC, IRON, RETICCTPCT in the last 72 hours. Sepsis Labs: Recent Labs  Lab 04/21/17 1430 04/22/17 2155  LATICACIDVEN 1.51 1.89    Recent Results (from the past 240 hour(s))  Blood culture (routine x 2)     Status: None (Preliminary result)   Collection Time: 04/22/17  9:59 PM  Result Value Ref Range Status   Specimen Description   Final    BLOOD RIGHT FOREARM Performed at [facility], [address] [place], [place] [address]    Special Requests   Final    BOTTLES DRAWN AEROBIC AND ANAEROBIC Blood Culture adequate volume Performed at [facility], [address] [place], [place] [address]    Culture   Final    NO GROWTH 3 DAYS Performed at [facility], [address] [place], [place] [address]    Report Status PENDING  Incomplete  Stat Gram stain     Status: None   Collection Time: 04/22/17 11:54 PM  Result Value Ref Range Status   Specimen Description ARM  Final   Special Requests NONE  Final   Gram Stain   Final    NO WBC SEEN MODERATE GRAM POSITIVE COCCI IN CHAINS IN CLUSTERS RESULT CALLED TO, READ BACK BY AND VERIFIED WITH: S HEDGECOCK,RN @0227  04/23/17 MKELLY Performed at [facility], [address] [place], [place] [address]    Report Status 04/23/2017 FINAL  Final  Aerobic Culture (superficial specimen)     Status: None   Collection Time: 04/23/17 12:16 AM  Result Value Ref Range Status   Specimen Description   Final    ARM Performed at [facility], [address], [place], [place] [address]    Special Requests   Final    NONE Performed at [facility], [address], [place], [place] [address]    Gram Stain   Final    RARE WBC PRESENT, PREDOMINANTLY PMN RARE GRAM POSITIVE COCCI Performed at [facility], [address], [place], [place] [address]    Culture ABUNDANT STAPHYLOCOCCUS AUREUS  Final   Report Status 04/26/2017 FINAL  Final   Organism ID, Bacteria STAPHYLOCOCCUS AUREUS  Final      Susceptibility   Staphylococcus aureus - MIC*    CIPROFLOXACIN >=8 RESISTANT Resistant     ERYTHROMYCIN >=8 RESISTANT Resistant     GENTAMICIN <=0.5 SENSITIVE Sensitive     OXACILLIN <=0.25 SENSITIVE Sensitive     TETRACYCLINE <=1 SENSITIVE Sensitive     VANCOMYCIN 1 SENSITIVE Sensitive     TRIMETH/SULFA <=10 SENSITIVE Sensitive     CLINDAMYCIN <=0.25 SENSITIVE Sensitive     RIFAMPIN <=0.5 SENSITIVE Sensitive     Inducible Clindamycin NEGATIVE Sensitive     * ABUNDANT STAPHYLOCOCCUS AUREUS      Radiology Studies: [place] Lt  Upper Extrem Ltd Soft Tissue Non Vascular  Result Date: 04/26/2017 CLINICAL DATA:  Cellulitis.  Evaluate for abscess. EXAM: ULTRASOUND LEFT UPPER EXTREMITY LIMITED TECHNIQUE: Ultrasound examination of the upper extremity soft tissues was performed in the area of clinical concern. COMPARISON:  None. FINDINGS: Prominent subcutaneous edema in the left antecubital area and forearm without discrete fluid collection. There is nonocclusive thrombus within a superficial vein in the dorsal aspect of the proximal forearm. IMPRESSION: 1. Prominent subcutaneous edema without evidence of abscess. 2. Nonocclusive thrombus within a superficial vein in the dorsal proximal forearm. Electronically Signed   By: Obie Dredge M.D.   On: 04/26/2017 13:04      Scheduled Meds: . enoxaparin (LOVENOX) injection  40 mg Subcutaneous Daily  . mupirocin cream   Topical BID  . potassium chloride  20 mEq Oral Daily   Continuous Infusions: . vancomycin Stopped (04/26/17 0932)     LOS: 3 days    Time spent: Total of 25 minutes spent with pt, greater than 50% of which was spent in discussion of  treatment, counseling and coordination of care   Latrelle Dodrill, MD Pager: Text Page via [email or web address]   If 7PM-7AM, please contact night-coverage [email or web address] 04/26/2017, 2:48 PM   Note - This record has been created using [employer]. Chart creation errors have been sought, but may not always have been located. Such creation errors do not reflect on the standard of medical care.

## 2017-04-27 ENCOUNTER — Encounter (HOSPITAL_COMMUNITY): Payer: Self-pay | Admitting: *Deleted

## 2017-04-27 LAB — HEPATIC FUNCTION PANEL
ALT: 24 U/L (ref 17–63)
AST: 33 U/L (ref 15–41)
Albumin: 2.7 g/dL — ABNORMAL LOW (ref 3.5–5.0)
Alkaline Phosphatase: 103 U/L (ref 38–126)
BILIRUBIN DIRECT: 0.2 mg/dL (ref 0.1–0.5)
BILIRUBIN INDIRECT: 0.5 mg/dL (ref 0.3–0.9)
Total Bilirubin: 0.7 mg/dL (ref 0.3–1.2)
Total Protein: 6.3 g/dL — ABNORMAL LOW (ref 6.5–8.1)

## 2017-04-27 LAB — AMMONIA: Ammonia: 79 umol/L — ABNORMAL HIGH (ref 9–35)

## 2017-04-27 MED ORDER — CLINDAMYCIN PHOSPHATE 300 MG/50ML IV SOLN
300.0000 mg | Freq: Three times a day (TID) | INTRAVENOUS | Status: DC
Start: 2017-04-27 — End: 2017-04-28
  Administered 2017-04-27 – 2017-04-28 (×3): 300 mg via INTRAVENOUS
  Filled 2017-04-27 (×6): qty 50

## 2017-04-27 NOTE — Plan of Care (Signed)
Plan of care 

## 2017-04-27 NOTE — Progress Notes (Signed)
PROGRESS NOTE    Jonathan CanterburyGary Sheldon Zawadzki  WUJ:[id number] DOB: 01-Jan-1966 DOA: 04/22/2017 PCP: Candi LeashHayes, Anthony, MD    Brief Narrative: Jonathan Ibarra is a 52 year old male with medical history significant for  liver cirrhosis, HCV, alcohol abuse who presented to the emergency department with left arm redness, swelling and draining purulence.  Patient was admitted with working diagnosis of left upper extremity cellulitis.  Patient had I&D performed by EDP, Gram stain culture with gram-positive cocci , staph aureus..  Patient currently on vancomycin.  Vancomycin changed to clindamycin.    Assessment & Plan:   Principal Problem:   Cellulitis of left arm Active Problems:   Hepatic cirrhosis (HCC)   S/P TIPS (transjugular intrahepatic portosystemic shunt)   Abscess of left arm   Cellulitis   Cellulitis of the left upper extremity in the setting of recent tattoo Wound cultures showed MSSA sensitive to clindamycin patient was initially started on vancomycin changed to Clinda.  Plan to transition the IV antibiotics to oral tomorrow and complete the course of 2 weeks.   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  Elevated ammonia levels around 70s, but no signs of hepatic encephalopathy.  Liver function tests within normal limits.  Pancytopenia Secondary to liver cirrhosis counts are stable.   Hypokalemia replaced  DVT prophylaxis: Lovenox Code Status: ( full code.  Family Communication: discussed the plan with the patient.  Disposition Plan: possible d/c in am    Consultants:   None.   Procedures: On February 22  Antimicrobials: clindamycin to complete the course.    Subjective: No new cmplaints. His pain and redness improved since admission.   Objective: Vitals:   04/26/17 1350 04/26/17 2100 04/27/17 0647 04/27/17 1427  BP: 123/64 115/62 116/64 106/69  Pulse: 82 85 69 69  Resp: 16 17 14 17   Temp: (!) 97.4 F (36.3 C) (!) 97.3 F (36.3 C) 97.9 F  (36.6 C) 97.8 F (36.6 C)  TempSrc: Oral Oral Oral Oral  SpO2: 97% 99% 99% 99%  Weight:      Height:        Intake/Output Summary (Last 24 hours) at 04/27/2017 1630 Last data filed at 04/27/2017 1400 Gross per 24 hour  Intake 1460 ml  Output -  Net 1460 ml   Filed Weights   04/22/17 1613 04/23/17 1530  Weight: 111.1 kg (245 lb) 111.1 kg (245 lb)    Examination:  General exam: Appears calm and comfortable  Respiratory system: Clear to auscultation. Respiratory effort normal. Cardiovascular system: S1 & S2 heard, RRR. No JVD, murmurs, rubs, gallops or clicks. No pedal edema. Gastrointestinal system: Abdomen is nondistended, soft and nontender. No organomegaly or masses felt. Normal bowel sounds heard. Central nervous system: Alert and oriented. No focal neurological deficits. Extremities: induration and erythema improved. Tenderness present.  Skin: No rashes, lesions or ulcers Psychiatry: Judgement and insight appear normal. Mood & affect appropriate.     Data Reviewed: I have personally reviewed following labs and imaging studies  CBC: Recent Labs  Lab 04/21/17 1358 04/22/17 1617 04/25/17 0551  WBC 10.4 6.1 3.3*  NEUTROABS 8.1* 4.3  --   HGB 14.5 13.2 12.6*  HCT 40.6 37.0* 36.3*  MCV 92.7 91.4 95.0  PLT 121* 132* 143*   Basic Metabolic Panel: Recent Labs  Lab 04/21/17 1358 04/22/17 1617 04/25/17 0551  NA 132* 134* 136  K 3.6 3.2* 4.1  CL 101 102 101  CO2 23 24 28   GLUCOSE 127* 146* 126*  BUN 11 11 10   CREATININE 0.79 0.69 0.65  CALCIUM 8.5* 8.2* 8.5*   GFR: Estimated Creatinine Clearance: 148.6 mL/min (by C-G formula based on SCr of 0.65 mg/dL). Liver Function Tests: Recent Labs  Lab 04/21/17 1358 04/22/17 1617 04/25/17 0551 04/27/17 0527  AST 19 25 33 33  ALT 18 20 27 24   ALKPHOS 88 98 101 103  BILITOT 2.2* 1.5* 0.6 0.7  PROT 6.4* 6.3* 6.1* 6.3*  ALBUMIN 3.2* 2.9* 2.7* 2.7*   No results for input(s): LIPASE, AMYLASE in the last 168  hours. Recent Labs  Lab 04/23/17 0020 04/27/17 0527  AMMONIA 74* 79*   Coagulation Profile: Recent Labs  Lab 04/22/17 2159  INR 1.10   Cardiac Enzymes: No results for input(s): CKTOTAL, CKMB, CKMBINDEX, TROPONINI in the last 168 hours. BNP (last 3 results) No results for input(s): PROBNP in the last 8760 hours. HbA1C: No results for input(s): HGBA1C in the last 72 hours. CBG: No results for input(s): GLUCAP in the last 168 hours. Lipid Profile: No results for input(s): CHOL, HDL, LDLCALC, TRIG, CHOLHDL, LDLDIRECT in the last 72 hours. Thyroid Function Tests: No results for input(s): TSH, T4TOTAL, FREET4, T3FREE, THYROIDAB in the last 72 hours. Anemia Panel: No results for input(s): VITAMINB12, FOLATE, FERRITIN, TIBC, IRON, RETICCTPCT in the last 72 hours. Sepsis Labs: Recent Labs  Lab 04/21/17 1430 04/22/17 2155  LATICACIDVEN 1.51 1.89    Recent Results (from the past 240 hour(s))  Blood culture (routine x 2)     Status: None (Preliminary result)   Collection Time: 04/22/17  9:59 PM  Result Value Ref Range Status   Specimen Description   Final    BLOOD RIGHT FOREARM Performed at [facility], [address] [place], [place] [address]    Special Requests   Final    BOTTLES DRAWN AEROBIC AND ANAEROBIC Blood Culture adequate volume Performed at [facility], [address] [place], [place] [address]    Culture   Final    NO GROWTH 4 DAYS Performed at [facility], [address], [place], [place] [address]    Report Status PENDING  Incomplete  Stat Gram stain     Status: None   Collection Time: 04/22/17 11:54 PM  Result Value Ref Range Status   Specimen Description ARM  Final   Special Requests NONE  Final   Gram Stain   Final    NO WBC SEEN MODERATE GRAM POSITIVE COCCI IN CHAINS IN CLUSTERS RESULT CALLED TO, READ BACK BY AND VERIFIED WITH: S HEDGECOCK,RN @0227  04/23/17 MKELLY Performed at [facility], [address], [place], [place] [address]    Report Status 04/23/2017 FINAL  Final  Aerobic Culture (superficial specimen)     Status: None   Collection Time: 04/23/17 12:16 AM  Result Value Ref Range Status   Specimen Description   Final    ARM Performed at [facility], [address] [place], [place] [address]    Special Requests   Final    NONE Performed at [facility], [address] [place], [place] [address]    Gram Stain   Final    RARE WBC PRESENT, PREDOMINANTLY PMN RARE GRAM POSITIVE COCCI Performed at [facility], [address] [place], [place] [address]    Culture ABUNDANT STAPHYLOCOCCUS AUREUS  Final   Report Status 04/26/2017 FINAL  Final   Organism ID, Bacteria STAPHYLOCOCCUS AUREUS  Final  Susceptibility   Staphylococcus aureus - MIC*    CIPROFLOXACIN >=8 RESISTANT Resistant     ERYTHROMYCIN >=8 RESISTANT Resistant     GENTAMICIN <=0.5 SENSITIVE Sensitive     OXACILLIN <=0.25 SENSITIVE Sensitive     TETRACYCLINE <=1 SENSITIVE Sensitive     VANCOMYCIN 1 SENSITIVE Sensitive     TRIMETH/SULFA <=10 SENSITIVE Sensitive     CLINDAMYCIN <=0.25 SENSITIVE Sensitive     RIFAMPIN <=0.5 SENSITIVE Sensitive     Inducible Clindamycin NEGATIVE Sensitive     * ABUNDANT STAPHYLOCOCCUS AUREUS         Radiology Studies: [place] Lt Upper Extrem Ltd Soft Tissue Non Vascular  Result Date: 04/26/2017 CLINICAL DATA:  Cellulitis.  Evaluate for abscess. EXAM: ULTRASOUND LEFT UPPER EXTREMITY LIMITED TECHNIQUE: Ultrasound examination of the upper extremity soft tissues was performed in the area of clinical concern. COMPARISON:  None. FINDINGS: Prominent subcutaneous edema in the left antecubital area and forearm without discrete fluid collection. There is nonocclusive thrombus within a superficial vein in the dorsal aspect of the proximal forearm. IMPRESSION: 1. Prominent subcutaneous edema without evidence of abscess. 2. Nonocclusive  thrombus within a superficial vein in the dorsal proximal forearm. Electronically Signed   By: Obie Dredge M.D.   On: 04/26/2017 13:04        Scheduled Meds: . enoxaparin (LOVENOX) injection  40 mg Subcutaneous Daily  . mupirocin cream   Topical BID   Continuous Infusions: . vancomycin 1,000 mg (04/27/17 1558)     LOS: 4 days    Time spent: 35 minutes   Kathlen Mody, MD [facility] Pager [phone] If 7PM-7AM, please contact night-coverage [email or web address] Password [facility] 04/27/2017, 4:30 PM

## 2017-04-28 DIAGNOSIS — Z95828 Presence of other vascular implants and grafts: Secondary | ICD-10-CM

## 2017-04-28 DIAGNOSIS — K7460 Unspecified cirrhosis of liver: Secondary | ICD-10-CM

## 2017-04-28 DIAGNOSIS — L03114 Cellulitis of left upper limb: Principal | ICD-10-CM

## 2017-04-28 LAB — CULTURE, BLOOD (ROUTINE X 2)
Culture: NO GROWTH
Special Requests: ADEQUATE

## 2017-04-28 MED ORDER — TRAMADOL HCL 50 MG PO TABS: 50.0000 mg | ORAL_TABLET | Freq: Two times a day (BID) | ORAL | 0 refills | Status: AC | PRN

## 2017-04-28 MED ORDER — CLINDAMYCIN HCL 300 MG PO CAPS: 300.0000 mg | ORAL_CAPSULE | Freq: Three times a day (TID) | ORAL | 0 refills | Status: AC

## 2017-04-28 MED ORDER — MUPIROCIN CALCIUM 2 % EX CREA: TOPICAL_CREAM | Freq: Two times a day (BID) | CUTANEOUS | 0 refills | Status: DC

## 2017-04-28 NOTE — Discharge Summary (Signed)
Physician Discharge Summary  Muhsin Doris ZOX:[id number] DOB: 25-Jul-1965 DOA: 04/22/2017  PCP: Candi Leash, MD  Admit date: 04/22/2017 Discharge date: 04/28/2017  Admitted From: Home.  Disposition:   Home.   Recommendations for Outpatient Follow-up:  1. Follow up with PCP in 1-2 weeks 2. Please obtain BMP/CBC in one week     Discharge Condition:stable.  CODE STATUS: full code.  Diet recommendation: Heart Healthy  Brief/Interim Summary: Ilian Wessell Brannis a 52 year old male with medical history significant for liver cirrhosis, HCV, alcohol abuse who presented to the emergency department with left arm redness, swelling and draining purulence. Patient was admitted with working diagnosis of left upper extremity cellulitis. Patient had I&D performed by EDP, Gram stain culture with gram-positive cocci , staph aureus.. Patient currently on vancomycin.  Vancomycin changed to clindamycin.    Discharge Diagnoses:  Principal Problem:   Cellulitis of left arm Active Problems:   Hepatic cirrhosis (HCC)   S/P TIPS (transjugular intrahepatic portosystemic shunt)   Abscess of left arm   Cellulitis  Cellulitis of the left upper extremity in the setting of recent tattoo Wound cultures showed MSSA sensitive to clindamycin patient was initially started on vancomycin changed to Clinda. Transition to oral clindamycin to complete the course on discharge.   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  Elevated ammonia levels around 70s, but no signs of hepatic encephalopathy.  Liver function tests within normal limits.recommend outpatient follow up.   Pancytopenia Secondary to liver cirrhosis counts are stable.   Hypokalemia replaced    Discharge Instructions  Discharge Instructions    Diet - low sodium heart healthy   Complete by:  As directed    Discharge instructions   Complete by:  As directed    Please follow up with PCP In one week before the  antibiotics are completed.     Allergies as of 04/28/2017      Reactions   Tylenol [acetaminophen] Other (See Comments)   "Not good for liver"      Medication List    TAKE these medications   clindamycin 300 MG capsule Commonly known as:  CLEOCIN Take 1 capsule (300 mg total) by mouth 3 (three) times daily for 10 days.   furosemide 40 MG tablet Commonly known as:  LASIX Take 1 tablet (40 mg total) by mouth daily.   mupirocin cream 2 % Commonly known as:  BACTROBAN Apply topically 2 (two) times daily.   traMADol 50 MG tablet Commonly known as:  ULTRAM Take 1 tablet (50 mg total) by mouth every 12 (twelve) hours as needed.      Follow-up Information    Candi Leash, MD. Schedule an appointment as soon as possible for a visit in 1 week(s).   Specialty:  Family Medicine Contact information: [address] [place] [address] [phone]        [facility]. Schedule an appointment as soon as possible for a visit in 1 week(s).   Why:  call on Monday 3/4 to schedule appointment for follow up, let them know you were discharged from the hospital  Contact information: [address] [place] [address] [phone]         Allergies  Allergen Reactions  . Tylenol [Acetaminophen] Other (See Comments)    "Not good for liver"    Consultations:  None.    Procedures/Studies: [place] Lt Upper Extrem Ltd Soft Tissue Non Vascular  Result Date: 04/26/2017 CLINICAL DATA:  Cellulitis.  Evaluate for abscess. EXAM: ULTRASOUND LEFT UPPER EXTREMITY LIMITED TECHNIQUE: Ultrasound examination of the upper extremity soft tissues was performed in the area of clinical concern. COMPARISON:  None. FINDINGS: Prominent subcutaneous edema in the left antecubital area and forearm without discrete fluid collection. There is nonocclusive thrombus within a superficial vein in the dorsal aspect of the proximal forearm.  IMPRESSION: 1. Prominent subcutaneous edema without evidence of abscess. 2. Nonocclusive thrombus within a superficial vein in the dorsal proximal forearm. Electronically Signed   By: Obie DredgeWilliam T Derry M.D.   On: 04/26/2017 13:04       Subjective: No new complaints.   Discharge Exam: Vitals:   04/28/17 0900 04/28/17 1331  BP: 117/68 113/68  Pulse: 75 70  Resp:  16  Temp: 98 F (36.7 C) (!) 97.5 F (36.4 C)  SpO2: 95% 100%   Vitals:   04/28/17 0617 04/28/17 0800 04/28/17 0900 04/28/17 1331  BP: 113/61 130/85 117/68 113/68  Pulse: 74 79 75 70  Resp: 18 18  16   Temp: 98.5 F (36.9 C) 98.4 F (36.9 C) 98 F (36.7 C) (!) 97.5 F (36.4 C)  TempSrc: Oral Oral Oral Oral  SpO2: 97% 97% 95% 100%  Weight:      Height:        General: Pt is alert, awake, not in acute distress Cardiovascular: RRR, S1/S2 +, no rubs, no gallops Respiratory: CTA bilaterally, no wheezing, no rhonchi Abdominal: Soft, NT, ND, bowel sounds + Extremities: left upper extremity cellulitis improved.     The results of significant diagnostics from this hospitalization (including imaging, microbiology, ancillary and laboratory) are listed below for reference.     Microbiology: Recent Results (from the past 240 hour(s))  Blood culture (routine x 2)     Status: None   Collection Time: 04/22/17  9:59 PM  Result Value Ref Range Status   Specimen Description   Final    BLOOD RIGHT FOREARM Performed at [facility], [address] [place], [place] [address]    Special Requests   Final    BOTTLES DRAWN AEROBIC AND ANAEROBIC Blood Culture adequate volume Performed at [facility], [address] [place], [place] [address]    Culture   Final    NO GROWTH 5 DAYS Performed at [facility], [address] [place], [place] [address]    Report Status 04/28/2017 FINAL  Final  Stat Gram stain     Status: None   Collection Time: 04/22/17 11:54 PM  Result Value Ref Range Status    Specimen Description ARM  Final   Special Requests NONE  Final   Gram Stain   Final    NO WBC SEEN MODERATE GRAM POSITIVE COCCI IN CHAINS IN CLUSTERS RESULT CALLED TO, READ BACK BY AND VERIFIED WITH: S HEDGECOCK,RN @0227  04/23/17 MKELLY Performed at [facility], [address] [place], [place] [address]    Report Status 04/23/2017 FINAL  Final  Aerobic Culture (superficial specimen)     Status: None   Collection Time: 04/23/17 12:16 AM  Result Value Ref Range Status   Specimen Description   Final    ARM Performed at [facility], [address] [place], [place] [address]    Special Requests   Final    NONE Performed at [facility], [address] [place], [place] [address]    Gram Stain   Final    RARE WBC PRESENT, PREDOMINANTLY PMN  RARE GRAM POSITIVE COCCI Performed at [facility], [address], [place], [place] [address]    Culture ABUNDANT STAPHYLOCOCCUS AUREUS  Final   Report Status 04/26/2017 FINAL  Final   Organism ID, Bacteria STAPHYLOCOCCUS AUREUS  Final      Susceptibility   Staphylococcus aureus - MIC*    CIPROFLOXACIN >=8 RESISTANT Resistant     ERYTHROMYCIN >=8 RESISTANT Resistant     GENTAMICIN <=0.5 SENSITIVE Sensitive     OXACILLIN <=0.25 SENSITIVE Sensitive     TETRACYCLINE <=1 SENSITIVE Sensitive     VANCOMYCIN 1 SENSITIVE Sensitive     TRIMETH/SULFA <=10 SENSITIVE Sensitive     CLINDAMYCIN <=0.25 SENSITIVE Sensitive     RIFAMPIN <=0.5 SENSITIVE Sensitive     Inducible Clindamycin NEGATIVE Sensitive     * ABUNDANT STAPHYLOCOCCUS AUREUS     Labs: BNP (last 3 results) No results for input(s): BNP in the last 8760 hours. Basic Metabolic Panel: Recent Labs  Lab 04/22/17 1617 04/25/17 0551  NA 134* 136  K 3.2* 4.1  CL 102 101  CO2 24 28  GLUCOSE 146* 126*  BUN 11 10  CREATININE 0.69 0.65  CALCIUM 8.2* 8.5*   Liver Function Tests: Recent Labs  Lab 04/22/17 1617  04/25/17 0551 04/27/17 0527  AST 25 33 33  ALT 20 27 24   ALKPHOS 98 101 103  BILITOT 1.5* 0.6 0.7  PROT 6.3* 6.1* 6.3*  ALBUMIN 2.9* 2.7* 2.7*   No results for input(s): LIPASE, AMYLASE in the last 168 hours. Recent Labs  Lab 04/23/17 0020 04/27/17 0527  AMMONIA 74* 79*   CBC: Recent Labs  Lab 04/22/17 1617 04/25/17 0551  WBC 6.1 3.3*  NEUTROABS 4.3  --   HGB 13.2 12.6*  HCT 37.0* 36.3*  MCV 91.4 95.0  PLT 132* 143*   Cardiac Enzymes: No results for input(s): CKTOTAL, CKMB, CKMBINDEX, TROPONINI in the last 168 hours. BNP: Invalid input(s): POCBNP CBG: No results for input(s): GLUCAP in the last 168 hours. D-Dimer No results for input(s): DDIMER in the last 72 hours. Hgb A1c No results for input(s): HGBA1C in the last 72 hours. Lipid Profile No results for input(s): CHOL, HDL, LDLCALC, TRIG, CHOLHDL, LDLDIRECT in the last 72 hours. Thyroid function studies No results for input(s): TSH, T4TOTAL, T3FREE, THYROIDAB in the last 72 hours.  Invalid input(s): FREET3 Anemia work up No results for input(s): VITAMINB12, FOLATE, FERRITIN, TIBC, IRON, RETICCTPCT in the last 72 hours. Urinalysis    Component Value Date/Time   COLORURINE AMBER (A) 04/22/2017 2352   APPEARANCEUR CLEAR 04/22/2017 2352   LABSPEC 1.027 04/22/2017 2352   PHURINE 5.0 04/22/2017 2352   GLUCOSEU NEGATIVE 04/22/2017 2352   HGBUR NEGATIVE 04/22/2017 2352   BILIRUBINUR SMALL (A) 04/22/2017 2352   KETONESUR NEGATIVE 04/22/2017 2352   PROTEINUR NEGATIVE 04/22/2017 2352   NITRITE NEGATIVE 04/22/2017 2352   LEUKOCYTESUR TRACE (A) 04/22/2017 2352   Sepsis Labs Invalid input(s): PROCALCITONIN,  WBC,  LACTICIDVEN Microbiology Recent Results (from the past 240 hour(s))  Blood culture (routine x 2)     Status: None   Collection Time: 04/22/17  9:59 PM  Result Value Ref Range Status   Specimen Description   Final    BLOOD RIGHT FOREARM Performed at [facility], [address]  [place], [place] [address]    Special Requests   Final    BOTTLES DRAWN AEROBIC AND ANAEROBIC Blood Culture adequate volume Performed at [facility], [address]  [place], [place] [address]    Culture   Final    NO GROWTH 5 DAYS Performed at [facility], [address] [place], [place] [address]    Report Status 04/28/2017 FINAL  Final  Stat Gram stain     Status: None   Collection Time: 04/22/17 11:54 PM  Result Value Ref Range Status   Specimen Description ARM  Final   Special Requests NONE  Final   Gram Stain   Final    NO WBC SEEN MODERATE GRAM POSITIVE COCCI IN CHAINS IN CLUSTERS RESULT CALLED TO, READ BACK BY AND VERIFIED WITH: S HEDGECOCK,RN @0227  04/23/17 MKELLY Performed at [facility], [address], [place], [place] [address]    Report Status 04/23/2017 FINAL  Final  Aerobic Culture (superficial specimen)     Status: None   Collection Time: 04/23/17 12:16 AM  Result Value Ref Range Status   Specimen Description   Final    ARM Performed at [facility], [address], [place], [place] [address]    Special Requests   Final    NONE Performed at [facility], [address], [place], [place] [address]    Gram Stain   Final    RARE WBC PRESENT, PREDOMINANTLY PMN RARE GRAM POSITIVE COCCI Performed at [facility], [address] [place], [place] [address]    Culture ABUNDANT STAPHYLOCOCCUS AUREUS  Final   Report Status 04/26/2017 FINAL  Final   Organism ID, Bacteria STAPHYLOCOCCUS AUREUS  Final      Susceptibility   Staphylococcus aureus - MIC*    CIPROFLOXACIN >=8 RESISTANT Resistant     ERYTHROMYCIN >=8 RESISTANT Resistant     GENTAMICIN <=0.5 SENSITIVE Sensitive     OXACILLIN <=0.25 SENSITIVE Sensitive     TETRACYCLINE <=1 SENSITIVE Sensitive     VANCOMYCIN 1 SENSITIVE Sensitive     TRIMETH/SULFA <=10 SENSITIVE Sensitive     CLINDAMYCIN <=0.25 SENSITIVE Sensitive      RIFAMPIN <=0.5 SENSITIVE Sensitive     Inducible Clindamycin NEGATIVE Sensitive     * ABUNDANT STAPHYLOCOCCUS AUREUS     Time coordinating discharge: Over 30 minutes  SIGNED:   Kathlen Mody, MD  [facility] 04/28/2017, 2:16 PM Pager   If 7PM-7AM, please contact night-coverage [email or web address] Password [email or web address]

## 2017-04-28 NOTE — Progress Notes (Signed)
Spoke with patient at bedside. States he has a friend coming to get him today, he is estranged from family. He states he is unable to afford his medication, completed Match application and provided letter to get scripts filled. Explained the program and the limitations, $3 copay and only able to use it once per year. Unable to secure app[facility] with [facility] or [facility] states he can call on Monday and they will have more availability at that time. Instructions provided to patient. [phone]

## 2017-04-28 NOTE — Progress Notes (Signed)
RN reviewed discharge instructions with patient. All questions answered.   Paperwork and prescriptions given.   NT rolled patient down with all belongings to family car.  

## 2022-09-30 ENCOUNTER — Other Ambulatory Visit: Payer: Self-pay

## 2022-09-30 ENCOUNTER — Inpatient Hospital Stay (HOSPITAL_COMMUNITY)
Admission: EM | Admit: 2022-09-30 | Discharge: 2022-10-08 | DRG: 872 | Disposition: A | Discharge status: Home / Self Care | Payer: Commercial Managed Care - HMO | Attending: Internal Medicine | Admitting: Internal Medicine

## 2022-09-30 ENCOUNTER — Emergency Department (HOSPITAL_COMMUNITY): Payer: Commercial Managed Care - HMO

## 2022-09-30 DIAGNOSIS — I471 Supraventricular tachycardia, unspecified: Secondary | ICD-10-CM

## 2022-09-30 DIAGNOSIS — K7030 Alcoholic cirrhosis of liver without ascites: Secondary | ICD-10-CM | POA: Diagnosis present

## 2022-09-30 DIAGNOSIS — Z8614 Personal history of Methicillin resistant Staphylococcus aureus infection: Secondary | ICD-10-CM

## 2022-09-30 DIAGNOSIS — B1920 Unspecified viral hepatitis C without hepatic coma: Secondary | ICD-10-CM | POA: Diagnosis present

## 2022-09-30 DIAGNOSIS — Z8619 Personal history of other infectious and parasitic diseases: Secondary | ICD-10-CM

## 2022-09-30 DIAGNOSIS — K219 Gastro-esophageal reflux disease without esophagitis: Secondary | ICD-10-CM | POA: Diagnosis present

## 2022-09-30 DIAGNOSIS — L03115 Cellulitis of right lower limb: Secondary | ICD-10-CM | POA: Diagnosis present

## 2022-09-30 DIAGNOSIS — R600 Localized edema: Secondary | ICD-10-CM | POA: Diagnosis present

## 2022-09-30 DIAGNOSIS — K7682 Hepatic encephalopathy: Secondary | ICD-10-CM | POA: Diagnosis present

## 2022-09-30 DIAGNOSIS — R6511 Systemic inflammatory response syndrome (SIRS) of non-infectious origin with acute organ dysfunction: Secondary | ICD-10-CM

## 2022-09-30 DIAGNOSIS — F1020 Alcohol dependence, uncomplicated: Secondary | ICD-10-CM | POA: Diagnosis present

## 2022-09-30 DIAGNOSIS — J9811 Atelectasis: Secondary | ICD-10-CM | POA: Diagnosis present

## 2022-09-30 DIAGNOSIS — E872 Acidosis, unspecified: Secondary | ICD-10-CM | POA: Diagnosis present

## 2022-09-30 DIAGNOSIS — F17210 Nicotine dependence, cigarettes, uncomplicated: Secondary | ICD-10-CM | POA: Diagnosis present

## 2022-09-30 DIAGNOSIS — E876 Hypokalemia: Secondary | ICD-10-CM | POA: Diagnosis present

## 2022-09-30 DIAGNOSIS — Z1152 Encounter for screening for COVID-19: Secondary | ICD-10-CM

## 2022-09-30 DIAGNOSIS — Z6839 Body mass index (BMI) 39.0-39.9, adult: Secondary | ICD-10-CM

## 2022-09-30 DIAGNOSIS — E871 Hypo-osmolality and hyponatremia: Secondary | ICD-10-CM | POA: Diagnosis not present

## 2022-09-30 DIAGNOSIS — N179 Acute kidney failure, unspecified: Secondary | ICD-10-CM | POA: Diagnosis present

## 2022-09-30 DIAGNOSIS — R011 Cardiac murmur, unspecified: Secondary | ICD-10-CM | POA: Diagnosis present

## 2022-09-30 DIAGNOSIS — R4182 Altered mental status, unspecified: Secondary | ICD-10-CM | POA: Diagnosis not present

## 2022-09-30 DIAGNOSIS — I4891 Unspecified atrial fibrillation: Secondary | ICD-10-CM | POA: Diagnosis present

## 2022-09-30 DIAGNOSIS — A400 Sepsis due to streptococcus, group A: Secondary | ICD-10-CM | POA: Diagnosis not present

## 2022-09-30 DIAGNOSIS — Z79899 Other long term (current) drug therapy: Secondary | ICD-10-CM

## 2022-09-30 DIAGNOSIS — Z91148 Patient's other noncompliance with medication regimen for other reason: Secondary | ICD-10-CM

## 2022-09-30 DIAGNOSIS — I10 Essential (primary) hypertension: Secondary | ICD-10-CM | POA: Diagnosis present

## 2022-09-30 DIAGNOSIS — E669 Obesity, unspecified: Secondary | ICD-10-CM | POA: Diagnosis present

## 2022-09-30 DIAGNOSIS — G40909 Epilepsy, unspecified, not intractable, without status epilepticus: Secondary | ICD-10-CM | POA: Diagnosis present

## 2022-09-30 DIAGNOSIS — Z886 Allergy status to analgesic agent status: Secondary | ICD-10-CM

## 2022-09-30 DIAGNOSIS — K7460 Unspecified cirrhosis of liver: Secondary | ICD-10-CM | POA: Diagnosis present

## 2022-09-30 DIAGNOSIS — A419 Sepsis, unspecified organism: Principal | ICD-10-CM

## 2022-09-30 DIAGNOSIS — F32A Depression, unspecified: Secondary | ICD-10-CM | POA: Diagnosis present

## 2022-09-30 DIAGNOSIS — K429 Umbilical hernia without obstruction or gangrene: Secondary | ICD-10-CM | POA: Diagnosis present

## 2022-09-30 DIAGNOSIS — E039 Hypothyroidism, unspecified: Secondary | ICD-10-CM | POA: Diagnosis present

## 2022-09-30 DIAGNOSIS — F05 Delirium due to known physiological condition: Secondary | ICD-10-CM | POA: Diagnosis present

## 2022-09-30 DIAGNOSIS — Z5986 Financial insecurity: Secondary | ICD-10-CM

## 2022-09-30 DIAGNOSIS — Z95828 Presence of other vascular implants and grafts: Secondary | ICD-10-CM

## 2022-09-30 DIAGNOSIS — R590 Localized enlarged lymph nodes: Secondary | ICD-10-CM | POA: Diagnosis present

## 2022-09-30 DIAGNOSIS — F1910 Other psychoactive substance abuse, uncomplicated: Secondary | ICD-10-CM | POA: Diagnosis present

## 2022-09-30 HISTORY — DX: Supraventricular tachycardia, unspecified: I47.10

## 2022-09-30 LAB — RESP PANEL BY RT-PCR (RSV, FLU A&B, COVID)  RVPGX2
Influenza A by PCR: NEGATIVE
Influenza B by PCR: NEGATIVE
Resp Syncytial Virus by PCR: NEGATIVE
SARS Coronavirus 2 by RT PCR: NEGATIVE

## 2022-09-30 LAB — COMPREHENSIVE METABOLIC PANEL
ALT: 44 U/L (ref 0–44)
AST: 49 U/L — ABNORMAL HIGH (ref 15–41)
Albumin: 2.1 g/dL — ABNORMAL LOW (ref 3.5–5.0)
Alkaline Phosphatase: 72 U/L (ref 38–126)
Anion gap: 11 (ref 5–15)
BUN: 11 mg/dL (ref 6–20)
CO2: 24 mmol/L (ref 22–32)
Calcium: 8.3 mg/dL — ABNORMAL LOW (ref 8.9–10.3)
Chloride: 100 mmol/L (ref 98–111)
Creatinine, Ser: 0.84 mg/dL (ref 0.61–1.24)
GFR, Estimated: >60 mL/min (ref >60)
Glucose, Bld: 121 mg/dL — ABNORMAL HIGH (ref 70–99)
Potassium: 3.1 mmol/L — ABNORMAL LOW (ref 3.5–5.1)
Sodium: 135 mmol/L (ref 135–145)
Total Bilirubin: 2.2 mg/dL — ABNORMAL HIGH (ref 0.3–1.2)
Total Protein: 5.7 g/dL — ABNORMAL LOW (ref 6.5–8.1)

## 2022-09-30 LAB — APTT: aPTT: 29 seconds (ref 24–36)

## 2022-09-30 LAB — I-STAT VENOUS BLOOD GAS, ED
Acid-Base Excess: 1 mmol/L (ref 0.0–2.0)
Bicarbonate: 23.9 mmol/L (ref 20.0–28.0)
Calcium, Ion: 1.1 mmol/L — ABNORMAL LOW (ref 1.15–1.40)
HCT: 37 % — ABNORMAL LOW (ref 39.0–52.0)
Hemoglobin: 12.6 g/dL — ABNORMAL LOW (ref 13.0–17.0)
O2 Saturation: 99 %
Potassium: 3.3 mmol/L — ABNORMAL LOW (ref 3.5–5.1)
Sodium: 135 mmol/L (ref 135–145)
TCO2: 25 mmol/L (ref 22–32)
pCO2, Ven: 30.6 mmHg — ABNORMAL LOW (ref 44–60)
pH, Ven: 7.5 — ABNORMAL HIGH (ref 7.25–7.43)
pO2, Ven: 112 mmHg — ABNORMAL HIGH (ref 32–45)

## 2022-09-30 LAB — I-STAT CHEM 8, ED
BUN: 12 mg/dL (ref 6–20)
BUN: 13 mg/dL (ref 6–20)
Calcium, Ion: 1.1 mmol/L — ABNORMAL LOW (ref 1.15–1.40)
Calcium, Ion: 1.1 mmol/L — ABNORMAL LOW (ref 1.15–1.40)
Chloride: 99 mmol/L (ref 98–111)
Chloride: 99 mmol/L (ref 98–111)
Creatinine, Ser: 0.6 mg/dL — ABNORMAL LOW (ref 0.61–1.24)
Creatinine, Ser: 0.6 mg/dL — ABNORMAL LOW (ref 0.61–1.24)
Glucose, Bld: 114 mg/dL — ABNORMAL HIGH (ref 70–99)
Glucose, Bld: 118 mg/dL — ABNORMAL HIGH (ref 70–99)
HCT: 35 % — ABNORMAL LOW (ref 39.0–52.0)
HCT: 35 % — ABNORMAL LOW (ref 39.0–52.0)
Hemoglobin: 11.9 g/dL — ABNORMAL LOW (ref 13.0–17.0)
Hemoglobin: 11.9 g/dL — ABNORMAL LOW (ref 13.0–17.0)
Potassium: 3.3 mmol/L — ABNORMAL LOW (ref 3.5–5.1)
Potassium: 3.3 mmol/L — ABNORMAL LOW (ref 3.5–5.1)
Sodium: 135 mmol/L (ref 135–145)
Sodium: 135 mmol/L (ref 135–145)
TCO2: 23 mmol/L (ref 22–32)
TCO2: 24 mmol/L (ref 22–32)

## 2022-09-30 LAB — PROTIME-INR
INR: 1.5 — ABNORMAL HIGH (ref 0.8–1.2)
Prothrombin Time: 18.1 seconds — ABNORMAL HIGH (ref 11.4–15.2)

## 2022-09-30 LAB — I-STAT CG4 LACTIC ACID, ED
Lactic Acid, Venous: 2.7 mmol/L (ref 0.5–1.9)
Lactic Acid, Venous: 3.2 mmol/L (ref 0.5–1.9)

## 2022-09-30 LAB — TROPONIN I (HIGH SENSITIVITY): Troponin I (High Sensitivity): 17 ng/L (ref <18)

## 2022-09-30 MED ORDER — LACTATED RINGERS IV SOLN: INTRAVENOUS | Status: DC

## 2022-09-30 MED ORDER — VANCOMYCIN HCL IN DEXTROSE 1-5 GM/200ML-% IV SOLN: 1000.0000 mg | Freq: Once | INTRAVENOUS | Status: DC

## 2022-09-30 MED ORDER — DILTIAZEM LOAD VIA INFUSION
10.0000 mg | Freq: Once | INTRAVENOUS | Status: AC
  Administered 2022-09-30: 10 mg via INTRAVENOUS
  Filled 2022-09-30: qty 10

## 2022-09-30 MED ORDER — LACTATED RINGERS IV BOLUS (SEPSIS)
1000.0000 mL | Freq: Once | INTRAVENOUS | Status: AC
  Administered 2022-09-30: 1000 mL via INTRAVENOUS

## 2022-09-30 MED ORDER — METRONIDAZOLE 500 MG/100ML IV SOLN
500.0000 mg | Freq: Once | INTRAVENOUS | Status: AC
  Administered 2022-09-30: 500 mg via INTRAVENOUS
  Filled 2022-09-30: qty 100

## 2022-09-30 MED ORDER — DILTIAZEM HCL-DEXTROSE 125-5 MG/125ML-% IV SOLN (PREMIX)
5.0000 mg/h | INTRAVENOUS | Status: DC
  Administered 2022-09-30: 5 mg/h via INTRAVENOUS
  Administered 2022-10-01: 15 mg/h via INTRAVENOUS
  Filled 2022-09-30 (×2): qty 125

## 2022-09-30 MED ORDER — KETOROLAC TROMETHAMINE 15 MG/ML IJ SOLN
15.0000 mg | Freq: Once | INTRAMUSCULAR | Status: AC
  Administered 2022-09-30: 15 mg via INTRAVENOUS
  Filled 2022-09-30: qty 1

## 2022-09-30 MED ORDER — VANCOMYCIN HCL 2000 MG/400ML IV SOLN
2000.0000 mg | Freq: Once | INTRAVENOUS | Status: AC
  Administered 2022-09-30: 2000 mg via INTRAVENOUS
  Filled 2022-09-30: qty 400

## 2022-09-30 MED ORDER — SODIUM CHLORIDE 0.9 % IV SOLN
2.0000 g | Freq: Once | INTRAVENOUS | Status: AC
  Administered 2022-09-30: 2 g via INTRAVENOUS
  Filled 2022-09-30: qty 12.5

## 2022-09-30 NOTE — ED Triage Notes (Addendum)
Patient BIB [facility] from home, patient had been laying on chair x2 days, fever, chills, AMS. A&Ox1, open wounds on legs x104months ago finish ABT therapy for wounds. 101.3, CBG 116, 127/51, HR118-160, 18g LAC 500IVF, Tylenol 650mg , Cap 25. Patient baseline is A&Ox4

## 2022-09-30 NOTE — Sepsis Progress Note (Signed)
Elink monitoring for the code sepsis protocol.   Notified provider of need to order repeat lactic acid  Per MD 30cc/kg fluid protocol not followed due to hx of cirrhosis with fluid overload.

## 2022-09-30 NOTE — Progress Notes (Signed)
ED Pharmacy Antibiotic Sign Off An antibiotic consult was received from an ED provider for vancomycin and cefepime per pharmacy dosing for sepsis. Patient was last seen in ED in May for RLE cellulitis. A chart review was completed to assess appropriateness.   The following one time order(s) were placed:  Vancomycin 2000mg  x1, Cefepime 2g x1  Further antibiotic and/or antibiotic pharmacy consults should be ordered by the admitting provider if indicated.   Thank you for allowing pharmacy to be a part of this patient's care.   Leander Rams, [facility]  Clinical Pharmacist 09/30/22 11:10 PM

## 2022-09-30 NOTE — ED Provider Notes (Signed)
[place] EMERGENCY DEPARTMENT AT [facility] Provider Note   CSN: [id number] Arrival date & time: 09/30/22  2224     History  Chief Complaint  Patient presents with   Altered Mental Status   Fever   Chills    Jonathan Ibarra is a 57 y.o. male EMS with concern for altered mental status, reported fevers chills nausea vomiting diarrhea.  Poor history provided by unknown acquaintances on scene.  Per chart review patient is history of cirrhosis status post TIPS.  History of polysubstance use. Patient prescribed lactulose.  Level 5 caveat due to AMS.  History EtOH abuse.  HPI     Home Medications Prior to Admission medications   Medication Sig Start Date End Date Taking? Authorizing Provider  furosemide (LASIX) 40 MG tablet Take 1 tablet (40 mg total) by mouth daily. Patient not taking: Reported on 04/22/2017 02/07/15   Clydia Llano, MD  mupirocin cream (BACTROBAN) 2 % Apply topically 2 (two) times daily. 04/28/17   Kathlen Mody, MD      Allergies    Tylenol [acetaminophen]    Review of Systems   Review of Systems  Unable to perform ROS: Mental status change    Physical Exam Updated Vital Signs BP 122/77 (BP Location: Right Arm)   Pulse (!) 104   Temp 97.6 F (36.4 C) (Oral)   Resp 20   SpO2 99%  Physical Exam Vitals and nursing note reviewed. Chaperone present: ED RN.  Constitutional:      Appearance: He is toxic-appearing.  HENT:     Head: Normocephalic and atraumatic.     Mouth/Throat:     Mouth: Mucous membranes are dry.     Pharynx: No oropharyngeal exudate or posterior oropharyngeal erythema.  Eyes:     General:        Right eye: No discharge.        Left eye: No discharge.     Conjunctiva/sclera: Conjunctivae normal.     Pupils: Pupils are equal, round, and reactive to light.  Cardiovascular:     Rate and Rhythm: Normal rate and regular rhythm.     Pulses: Normal pulses.     Heart sounds: Murmur heard.  Pulmonary:     Effort:  Pulmonary effort is normal. No tachypnea or respiratory distress.     Breath sounds: Wheezing and rales present.  Abdominal:     General: Bowel sounds are normal. There is no distension.     Palpations: Abdomen is soft.     Tenderness: There is abdominal tenderness. There is no right CVA tenderness, left CVA tenderness, guarding or rebound.  Genitourinary:    Penis: Normal.      Testes: Normal.  Musculoskeletal:        General: No deformity.     Cervical back: Neck supple.     Right lower leg: Edema present.     Left lower leg: Edema present.  Skin:    General: Skin is warm and dry.     Capillary Refill: Capillary refill takes less than 2 seconds.       Neurological:     Mental Status: He is alert and oriented to person, place, and time.  Psychiatric:        Mood and Affect: Mood normal.     ED Results / Procedures / Treatments   Labs (all labs ordered are listed, but only abnormal results are displayed) Labs Reviewed  COMPREHENSIVE METABOLIC PANEL - Abnormal; Notable for the following components:  Result Value   Potassium 3.1 (*)    Glucose, Bld 121 (*)    Calcium 8.3 (*)    Total Protein 5.7 (*)    Albumin 2.1 (*)    AST 49 (*)    Total Bilirubin 2.2 (*)    All other components within normal limits  PROTIME-INR - Abnormal; Notable for the following components:   Prothrombin Time 18.1 (*)    INR 1.5 (*)    All other components within normal limits  URINALYSIS, W/ REFLEX TO CULTURE (INFECTION SUSPECTED) - Abnormal; Notable for the following components:   Color, Urine AMBER (*)    APPearance CLOUDY (*)    Hgb urine dipstick SMALL (*)    Protein, ur 30 (*)    Leukocytes,Ua LARGE (*)    Bacteria, UA RARE (*)    All other components within normal limits  RAPID URINE DRUG SCREEN, HOSP PERFORMED - Abnormal; Notable for the following components:   Amphetamines POSITIVE (*)    Tetrahydrocannabinol POSITIVE (*)    All other components within normal limits  BRAIN  NATRIURETIC PEPTIDE - Abnormal; Notable for the following components:   B Natriuretic Peptide 197.7 (*)    All other components within normal limits  CBC WITH DIFFERENTIAL/PLATELET - Abnormal; Notable for the following components:   RBC 3.88 (*)    Hemoglobin 12.0 (*)    HCT 35.6 (*)    Platelets 92 (*)    Lymphs Abs 0.2 (*)    Abs Immature Granulocytes 0.09 (*)    All other components within normal limits  AMMONIA - Abnormal; Notable for the following components:   Ammonia 77 (*)    All other components within normal limits  I-STAT CHEM 8, ED - Abnormal; Notable for the following components:   Potassium 3.3 (*)    Creatinine, Ser 0.60 (*)    Glucose, Bld 118 (*)    Calcium, Ion 1.10 (*)    Hemoglobin 11.9 (*)    HCT 35.0 (*)    All other components within normal limits  I-STAT VENOUS BLOOD GAS, ED - Abnormal; Notable for the following components:   pH, Ven 7.500 (*)    pCO2, Ven 30.6 (*)    pO2, Ven 112 (*)    Potassium 3.3 (*)    Calcium, Ion 1.10 (*)    HCT 37.0 (*)    Hemoglobin 12.6 (*)    All other components within normal limits  I-STAT CG4 LACTIC ACID, ED - Abnormal; Notable for the following components:   Lactic Acid, Venous 2.7 (*)    All other components within normal limits  I-STAT CG4 LACTIC ACID, ED - Abnormal; Notable for the following components:   Lactic Acid, Venous 3.2 (*)    All other components within normal limits  I-STAT CHEM 8, ED - Abnormal; Notable for the following components:   Potassium 3.3 (*)    Creatinine, Ser 0.60 (*)    Glucose, Bld 114 (*)    Calcium, Ion 1.10 (*)    Hemoglobin 11.9 (*)    HCT 35.0 (*)    All other components within normal limits  I-STAT CG4 LACTIC ACID, ED - Abnormal; Notable for the following components:   Lactic Acid, Venous 2.6 (*)    All other components within normal limits  RESP PANEL BY RT-PCR (RSV, FLU A&B, COVID)  RVPGX2  CULTURE, BLOOD (ROUTINE X 2)  CULTURE, BLOOD (ROUTINE X 2)  URINE CULTURE  APTT   CBC WITH DIFFERENTIAL/PLATELET  PROCALCITONIN  ETHANOL  I-STAT CG4 LACTIC ACID, ED  I-STAT CG4 LACTIC ACID, ED  TROPONIN I (HIGH SENSITIVITY)  TROPONIN I (HIGH SENSITIVITY)    EKG EKG Interpretation Date/Time:  Thursday September 30 2022 22:45:57 EDT Ventricular Rate:  186 PR Interval:    QRS Duration:  96 QT Interval:  295 QTC Calculation: 519 R Axis:   243  Text Interpretation: Supraventricular tachycardia Left anterior fascicular block Probable anterolateral infarct, old Abnormal ECG Confirmed by Gerhard Munch [phone]) on 09/30/2022 10:47:30 PM  Radiology CT CHEST ABDOMEN PELVIS W CONTRAST  Result Date: 10/01/2022 CLINICAL DATA:  Sepsis hx ov cirrhosis with TIPS EXAM: CT CHEST, ABDOMEN, AND PELVIS WITH CONTRAST TECHNIQUE: Multidetector CT imaging of the chest, abdomen and pelvis was performed following the standard protocol during bolus administration of intravenous contrast. RADIATION DOSE REDUCTION: This exam was performed according to the departmental dose-optimization program which includes automated exposure control, adjustment of the mA and/or kV according to patient size and/or use of iterative reconstruction technique. CONTRAST:  75mL OMNIPAQUE IOHEXOL 350 MG/ML SOLN COMPARISON:  CT abdomen pelvis 01/25/2015 FINDINGS: CT CHEST FINDINGS Cardiovascular: Normal heart size. No significant pericardial effusion. The thoracic aorta is normal in caliber. No atherosclerotic plaque of the thoracic aorta. No coronary artery calcifications. The main pulmonary artery is normal in caliber. No central pulmonary embolus. Mediastinum/Nodes: No enlarged mediastinal, hilar, or axillary lymph nodes. Thyroid gland, trachea, and esophagus demonstrate no significant findings. Small hiatal hernia. Lungs/Pleura: Limited evaluation due to motion artifact. Bilateral lower lobe passive atelectasis. No focal consolidation. No pulmonary nodule. No pulmonary mass. Trace bilateral pleural effusions, right greater  than left. No pneumothorax. Musculoskeletal: Bilateral gynecomastia. No suspicious lytic or blastic osseous lesions. Likely subacute right anterior sixth rib fracture (3:47). Chronic midthoracic vertebral body vertebral body height loss with associated exaggerated kyphosis. CT ABDOMEN PELVIS FINDINGS Hepatobiliary: Tips procedure noted. No focal liver abnormality. Status post cholecystectomy. No biliary dilatation. Pancreas: No focal lesion. Normal pancreatic contour. No surrounding inflammatory changes. No main pancreatic ductal dilatation. Spleen: Normal in size without focal abnormality. Adrenals/Urinary Tract: No adrenal nodule bilaterally. Bilateral kidneys enhance symmetrically. Punctate left nephrolithiasis No hydronephrosis. No hydroureter. The urinary bladder is unremarkable. Stomach/Bowel: Limited evaluation due to motion artifact stomach is within normal limits. No evidence of bowel wall thickening or dilatation. Appendix appears normal. Vascular/Lymphatic: Slightly increased trace periaortic fat stranding and prominent but nonenlarged lymph nodes (3:81-91) no abdominal aorta or iliac aneurysm. Mild atherosclerotic plaque of the aorta and its branches. Right inguinal lymphadenopathy with lymph node measuring up to 1.9 cm. Associated subcutaneus soft tissue edema. No abdominalor pelvic lymphadenopathy. Reproductive: Prostate is unremarkable. Scrotum collimated off view. Other: No intraperitoneal free fluid. No intraperitoneal free gas. No organized fluid collection. Musculoskeletal: Small fat containing umbilical hernia. No suspicious lytic or blastic osseous lesions. No acute displaced fracture. Multilevel degenerative changes of the spine. IMPRESSION: 1.  Trace bilateral pleural effusions, right greater than left. 2. Right inguinal lymphadenopathy. Please note scrotum collimated off view. Recommend correlation with physical exam. 3. Slightly increased trace periaortic fat stranding and prominent but  nonenlarged lymph nodes. Question developing retroperitoneal fibrosis. Recommend attention on follow-up. 4. Nonobstructive punctate left nephrolithiasis. 5. Likely subacute right anterior sixth rib fracture. Correlate with point tenderness to palpation to evaluate for an acute component. 6. TIPS procedure. Electronically Signed   By: Tish Frederickson M.D.   On: 10/01/2022 02:00   DG Chest Port 1 View  Result Date: 09/30/2022 CLINICAL DATA:  Possible sepsis fever EXAM: PORTABLE CHEST 1  VIEW COMPARISON:  02/04/2015 FINDINGS: Cardiomegaly with mild central congestion. No consolidation, pleural effusion or pneumothorax. Low lung volumes IMPRESSION: Cardiomegaly with mild central congestion. Electronically Signed   By: Jasmine Pang M.D.   On: 09/30/2022 23:18    Procedures .Critical Care  Performed by: Paris Lore, PA-C Authorized by: Paris Lore, PA-C   Critical care provider statement:    Critical care time (minutes):  45   Critical care was time spent personally by me on the following activities:  Development of treatment plan with patient or surrogate, discussions with consultants, evaluation of patient's response to treatment, examination of patient, obtaining history from patient or surrogate, ordering and performing treatments and interventions, ordering and review of laboratory studies, ordering and review of radiographic studies, pulse oximetry and re-evaluation of patient's condition     Medications Ordered in ED Medications  lactated ringers infusion ( Intravenous New Bag/Given 10/01/22 0003)  diltiazem (CARDIZEM) 1 mg/mL load via infusion 10 mg (10 mg Intravenous Bolus from Bag 09/30/22 2339)    And  diltiazem (CARDIZEM) 125 mg in dextrose 5% 125 mL (1 mg/mL) infusion (12.5 mg/hr Intravenous Infusion Verify 10/01/22 0024)  potassium chloride 10 mEq in 100 mL IVPB (has no administration in time range)  magnesium sulfate IVPB 2 g 50 mL (has no administration in time range)   lactated ringers bolus 1,000 mL (0 mLs Intravenous Stopped 09/30/22 2341)  ceFEPIme (MAXIPIME) 2 g in sodium chloride 0.9 % 100 mL IVPB (0 g Intravenous Stopped 09/30/22 2349)  metroNIDAZOLE (FLAGYL) IVPB 500 mg (0 mg Intravenous Stopped 10/01/22 0132)  vancomycin (VANCOREADY) IVPB 2000 mg/400 mL (0 mg Intravenous Stopped 10/01/22 0206)  ketorolac (TORADOL) 15 MG/ML injection 15 mg (15 mg Intravenous Given 09/30/22 2311)  iohexol (OMNIPAQUE) 350 MG/ML injection 75 mL (75 mLs Intravenous Contrast Given 10/01/22 0110)    ED Course/ Medical Decision Making/ A&P Clinical Course as of 10/01/22 0404  Fri Oct 01, 2022  [id number] Consult to hospitalist, Dr. Imogene Burn, who is agreeable to admitting this patient to his service. I appreciate his collaboration in the care of this patient.  [RS]    Clinical Course User Index [RS] Virdie Penning, Eugene Gavia, PA-C                                 Medical Decision Making 57 y/o ,male with cirrhosis who presents for AMS.   Febrile and tachycardic on arrival. In Afib with RVR responding to diltiazem. Cardiac exam with systolic murmur, unclear if new. Pulmonary exam with wheezing. Abdominal exam with umbilical hernia and general TTP, not out of proportion to exam. A&O x 2, lethargic.   The differential diagnosis for AMS is extensive and includes, but is not limited to:  Drug overdose - opioids, alcohol, sedatives, antipsychotics, drug withdrawal, others Metabolic: hypoxia, hypoglycemia, hyperglycemia, hypercalcemia, hypernatremia, hyponatremia, uremia, hepatic encephalopathy, hypothyroidism, hyperthyroidism, vitamin B12 or thiamine deficiency, carbon monoxide poisoning, Wilson's disease, Lactic acidosis, DKA/HHOS Infectious: meningitis, encephalitis, bacteremia/sepsis, urinary tract infection, pneumonia, neurosyphilis Structural: Space-occupying lesion, (brain tumor, subdural hematoma, hydrocephalus,) Vascular: stroke, subarachnoid hemorrhage, coronary ischemia, hypertensive  encephalopathy, CNS vasculitis, thrombotic thrombocytopenic purpura, disseminated intravascular coagulation, hyperviscosity Psychiatric: Schizophrenia, depression; Other: Seizure, hypothermia, heat stroke, ICU psychosis, dementia -"sundowning."    Amount and/or Complexity of Data Reviewed Labs: ordered.    Details: CBC without leukocytosis, anemia with hemoglobin of 12 near patient's baseline.  CMP with hypokalemia of 3.1, mildly elevated AST to 49,  total bili elevated at 2.2.  INR 1.5.  Troponin is normal, VBG mild alkalosis with pH 7.5.  Lactic acid is normal at 1.7.  Ammonia77, lactic acid downtrending.  UA equivocal for infection, UDS with amphetamines and cannabinoids.  Radiology: ordered.    Details: Chest x-ray with mild vascular congestion.  ECG/medicine tests: ordered.    Details:  EKG with what initially was interpreted as A-fib with RVR but on exam and after discussion with the EP Dr. Jeraldine Loots, favor A-fib with RVR.  Risk Prescription drug management. Decision regarding hospitalization.   Doubt hepatic encephalopathy as etiology of patient's presentation.  Additionally clinically doubt SBP given benign abdominal exam.  Question intoxication, however given patient's septic presentation upon arrival and new A-fib with RVR requiring Dilt infusion do feel he would benefit from mission to the hospital at this time.  Consult Dr. Imogene Burn as above.  Patient admitted to the medicine service.  This chart was dictated using voice recognition software, Dragon. Despite the best efforts of this provider to proofread and correct errors, errors may still occur which can change documentation meaning.          Final Clinical Impression(s) / ED Diagnoses Final diagnoses:  Sepsis without acute organ dysfunction, due to unspecified organism [facility])  Altered mental status, unspecified altered mental status type    Rx / DC Orders ED Discharge Orders     None         Sherrilee Gilles 10/01/22 0406    Gerhard Munch, MD 10/03/22 1732

## 2022-10-01 ENCOUNTER — Inpatient Hospital Stay (HOSPITAL_COMMUNITY): Payer: Commercial Managed Care - HMO

## 2022-10-01 ENCOUNTER — Encounter (HOSPITAL_COMMUNITY): Payer: Self-pay | Admitting: Internal Medicine

## 2022-10-01 ENCOUNTER — Other Ambulatory Visit (HOSPITAL_COMMUNITY): Payer: 59

## 2022-10-01 ENCOUNTER — Emergency Department (HOSPITAL_COMMUNITY): Payer: Commercial Managed Care - HMO

## 2022-10-01 DIAGNOSIS — R6511 Systemic inflammatory response syndrome (SIRS) of non-infectious origin with acute organ dysfunction: Secondary | ICD-10-CM

## 2022-10-01 DIAGNOSIS — A400 Sepsis due to streptococcus, group A: Secondary | ICD-10-CM | POA: Diagnosis not present

## 2022-10-01 DIAGNOSIS — K7460 Unspecified cirrhosis of liver: Secondary | ICD-10-CM

## 2022-10-01 DIAGNOSIS — F32A Depression, unspecified: Secondary | ICD-10-CM | POA: Diagnosis not present

## 2022-10-01 DIAGNOSIS — N179 Acute kidney failure, unspecified: Secondary | ICD-10-CM | POA: Diagnosis present

## 2022-10-01 DIAGNOSIS — E039 Hypothyroidism, unspecified: Secondary | ICD-10-CM | POA: Diagnosis not present

## 2022-10-01 DIAGNOSIS — F1910 Other psychoactive substance abuse, uncomplicated: Secondary | ICD-10-CM

## 2022-10-01 DIAGNOSIS — K7030 Alcoholic cirrhosis of liver without ascites: Secondary | ICD-10-CM | POA: Diagnosis present

## 2022-10-01 DIAGNOSIS — J9811 Atelectasis: Secondary | ICD-10-CM | POA: Diagnosis present

## 2022-10-01 DIAGNOSIS — L03115 Cellulitis of right lower limb: Secondary | ICD-10-CM | POA: Diagnosis not present

## 2022-10-01 DIAGNOSIS — I10 Essential (primary) hypertension: Secondary | ICD-10-CM | POA: Diagnosis present

## 2022-10-01 DIAGNOSIS — Z5986 Financial insecurity: Secondary | ICD-10-CM | POA: Diagnosis not present

## 2022-10-01 DIAGNOSIS — I471 Supraventricular tachycardia, unspecified: Secondary | ICD-10-CM | POA: Diagnosis not present

## 2022-10-01 DIAGNOSIS — F05 Delirium due to known physiological condition: Secondary | ICD-10-CM | POA: Diagnosis not present

## 2022-10-01 DIAGNOSIS — Z1152 Encounter for screening for COVID-19: Secondary | ICD-10-CM | POA: Diagnosis not present

## 2022-10-01 DIAGNOSIS — E871 Hypo-osmolality and hyponatremia: Secondary | ICD-10-CM | POA: Diagnosis not present

## 2022-10-01 DIAGNOSIS — B955 Unspecified streptococcus as the cause of diseases classified elsewhere: Secondary | ICD-10-CM | POA: Diagnosis not present

## 2022-10-01 DIAGNOSIS — R4182 Altered mental status, unspecified: Secondary | ICD-10-CM | POA: Diagnosis not present

## 2022-10-01 DIAGNOSIS — F1020 Alcohol dependence, uncomplicated: Secondary | ICD-10-CM | POA: Diagnosis not present

## 2022-10-01 DIAGNOSIS — K429 Umbilical hernia without obstruction or gangrene: Secondary | ICD-10-CM | POA: Diagnosis present

## 2022-10-01 DIAGNOSIS — I4891 Unspecified atrial fibrillation: Secondary | ICD-10-CM | POA: Diagnosis present

## 2022-10-01 DIAGNOSIS — Z95828 Presence of other vascular implants and grafts: Secondary | ICD-10-CM

## 2022-10-01 DIAGNOSIS — E669 Obesity, unspecified: Secondary | ICD-10-CM | POA: Diagnosis present

## 2022-10-01 DIAGNOSIS — K7682 Hepatic encephalopathy: Secondary | ICD-10-CM | POA: Diagnosis not present

## 2022-10-01 DIAGNOSIS — E872 Acidosis, unspecified: Secondary | ICD-10-CM | POA: Diagnosis not present

## 2022-10-01 DIAGNOSIS — G40909 Epilepsy, unspecified, not intractable, without status epilepticus: Secondary | ICD-10-CM | POA: Diagnosis not present

## 2022-10-01 DIAGNOSIS — E876 Hypokalemia: Secondary | ICD-10-CM | POA: Diagnosis present

## 2022-10-01 DIAGNOSIS — Z8619 Personal history of other infectious and parasitic diseases: Secondary | ICD-10-CM | POA: Diagnosis not present

## 2022-10-01 DIAGNOSIS — F17210 Nicotine dependence, cigarettes, uncomplicated: Secondary | ICD-10-CM | POA: Diagnosis present

## 2022-10-01 DIAGNOSIS — Z6839 Body mass index (BMI) 39.0-39.9, adult: Secondary | ICD-10-CM | POA: Diagnosis not present

## 2022-10-01 DIAGNOSIS — R7881 Bacteremia: Secondary | ICD-10-CM | POA: Diagnosis not present

## 2022-10-01 LAB — I-STAT CG4 LACTIC ACID, ED: Lactic Acid, Venous: 1.7 mmol/L (ref 0.5–1.9)

## 2022-10-01 LAB — TSH: TSH: 1.222 u[IU]/mL (ref 0.350–4.500)

## 2022-10-01 LAB — CBC WITH DIFFERENTIAL/PLATELET
Abs Immature Granulocytes: 0.06 10*3/uL (ref 0.00–0.07)
Basophils Absolute: 0 10*3/uL (ref 0.0–0.1)
Basophils Relative: 0 %
Eosinophils Absolute: 0.2 10*3/uL (ref 0.0–0.5)
Eosinophils Relative: 3 %
HCT: 37.2 % — ABNORMAL LOW (ref 39.0–52.0)
Hemoglobin: 12.4 g/dL — ABNORMAL LOW (ref 13.0–17.0)
Immature Granulocytes: 1 %
Lymphocytes Relative: 3 %
Lymphs Abs: 0.2 10*3/uL — ABNORMAL LOW (ref 0.7–4.0)
MCH: 30.7 pg (ref 26.0–34.0)
MCHC: 33.3 g/dL (ref 30.0–36.0)
MCV: 92.1 fL (ref 80.0–100.0)
Monocytes Absolute: 0.3 10*3/uL (ref 0.1–1.0)
Monocytes Relative: 4 %
Neutro Abs: 5.8 10*3/uL (ref 1.7–7.7)
Neutrophils Relative %: 89 %
Platelets: 79 10*3/uL — ABNORMAL LOW (ref 150–400)
RBC: 4.04 MIL/uL — ABNORMAL LOW (ref 4.22–5.81)
RDW: 15.4 % (ref 11.5–15.5)
WBC: 6.5 10*3/uL (ref 4.0–10.5)
nRBC: 0 % (ref 0.0–0.2)

## 2022-10-01 LAB — COMPREHENSIVE METABOLIC PANEL
ALT: 40 U/L (ref 0–44)
AST: 41 U/L (ref 15–41)
Albumin: 2 g/dL — ABNORMAL LOW (ref 3.5–5.0)
Alkaline Phosphatase: 66 U/L (ref 38–126)
Anion gap: 10 (ref 5–15)
BUN: 11 mg/dL (ref 6–20)
CO2: 22 mmol/L (ref 22–32)
Calcium: 7.8 mg/dL — ABNORMAL LOW (ref 8.9–10.3)
Chloride: 101 mmol/L (ref 98–111)
Creatinine, Ser: 0.79 mg/dL (ref 0.61–1.24)
GFR, Estimated: >60 mL/min (ref >60)
Glucose, Bld: 115 mg/dL — ABNORMAL HIGH (ref 70–99)
Potassium: 3 mmol/L — ABNORMAL LOW (ref 3.5–5.1)
Sodium: 133 mmol/L — ABNORMAL LOW (ref 135–145)
Total Bilirubin: 1.9 mg/dL — ABNORMAL HIGH (ref 0.3–1.2)
Total Protein: 5.1 g/dL — ABNORMAL LOW (ref 6.5–8.1)

## 2022-10-01 LAB — ECHOCARDIOGRAM COMPLETE
Height: 73 in
S' Lateral: 3.6 cm
Weight: 4804.8 oz

## 2022-10-01 LAB — MAGNESIUM: Magnesium: 1.2 mg/dL — ABNORMAL LOW (ref 1.7–2.4)

## 2022-10-01 LAB — TROPONIN I (HIGH SENSITIVITY): Troponin I (High Sensitivity): 14 ng/L (ref <18)

## 2022-10-01 LAB — HIV ANTIBODY (ROUTINE TESTING W REFLEX): HIV Screen 4th Generation wRfx: NONREACTIVE

## 2022-10-01 LAB — PROCALCITONIN: Procalcitonin: 0.97 ng/mL

## 2022-10-01 LAB — ETHANOL: Alcohol, Ethyl (B): <10 mg/dL (ref <10)

## 2022-10-01 MED ORDER — VANCOMYCIN HCL 2000 MG/400ML IV SOLN
2000.0000 mg | Freq: Once | INTRAVENOUS | Status: AC
  Administered 2022-10-01: 2000 mg via INTRAVENOUS
  Filled 2022-10-01: qty 400

## 2022-10-01 MED ORDER — IOHEXOL 350 MG/ML SOLN
100.0000 mL | Freq: Once | INTRAVENOUS | Status: AC | PRN
  Administered 2022-10-01: 100 mL via INTRAVENOUS

## 2022-10-01 MED ORDER — LACTULOSE 10 GM/15ML PO SOLN
30.0000 g | Freq: Two times a day (BID) | ORAL | Status: DC
  Administered 2022-10-01 – 2022-10-03 (×3): 30 g via ORAL
  Filled 2022-10-01 (×4): qty 45

## 2022-10-01 MED ORDER — METOPROLOL TARTRATE 25 MG PO TABS
25.0000 mg | ORAL_TABLET | Freq: Two times a day (BID) | ORAL | Status: DC
  Administered 2022-10-01 – 2022-10-08 (×15): 25 mg via ORAL
  Filled 2022-10-01 (×15): qty 1

## 2022-10-01 MED ORDER — SODIUM CHLORIDE 0.9 % IV SOLN
2.0000 g | Freq: Three times a day (TID) | INTRAVENOUS | Status: DC
  Administered 2022-10-01: 2 g via INTRAVENOUS
  Filled 2022-10-01: qty 12.5

## 2022-10-01 MED ORDER — POTASSIUM CHLORIDE 10 MEQ/100ML IV SOLN
10.0000 meq | INTRAVENOUS | Status: AC
  Administered 2022-10-01 (×2): 10 meq via INTRAVENOUS
  Filled 2022-10-01 (×2): qty 100

## 2022-10-01 MED ORDER — ONDANSETRON HCL 4 MG/2ML IJ SOLN
4.0000 mg | Freq: Four times a day (QID) | INTRAMUSCULAR | Status: DC | PRN
  Administered 2022-10-01: 4 mg via INTRAVENOUS
  Filled 2022-10-01: qty 2

## 2022-10-01 MED ORDER — POTASSIUM CHLORIDE CRYS ER 20 MEQ PO TBCR
40.0000 meq | EXTENDED_RELEASE_TABLET | Freq: Once | ORAL | Status: AC
  Administered 2022-10-01: 40 meq via ORAL
  Filled 2022-10-01: qty 2

## 2022-10-01 MED ORDER — PENICILLIN G POTASSIUM 20000000 UNITS IJ SOLR
12.0000 10*6.[IU] | Freq: Two times a day (BID) | INTRAVENOUS | Status: DC
  Administered 2022-10-01 – 2022-10-07 (×13): 12 10*6.[IU] via INTRAVENOUS
  Filled 2022-10-01 (×16): qty 12

## 2022-10-01 MED ORDER — IOHEXOL 350 MG/ML SOLN
75.0000 mL | Freq: Once | INTRAVENOUS | Status: AC | PRN
  Administered 2022-10-01: 75 mL via INTRAVENOUS

## 2022-10-01 MED ORDER — MAGNESIUM SULFATE 2 GM/50ML IV SOLN
2.0000 g | Freq: Once | INTRAVENOUS | Status: AC
  Administered 2022-10-01: 2 g via INTRAVENOUS
  Filled 2022-10-01: qty 50

## 2022-10-01 MED ORDER — LINEZOLID 600 MG/300ML IV SOLN
600.0000 mg | Freq: Two times a day (BID) | INTRAVENOUS | Status: DC
  Administered 2022-10-01 – 2022-10-04 (×6): 600 mg via INTRAVENOUS
  Filled 2022-10-01 (×6): qty 300

## 2022-10-01 MED ORDER — MELATONIN 5 MG PO TABS
10.0000 mg | ORAL_TABLET | Freq: Every evening | ORAL | Status: DC | PRN
  Administered 2022-10-01 – 2022-10-04 (×4): 10 mg via ORAL
  Filled 2022-10-01 (×4): qty 2

## 2022-10-01 MED ORDER — CEPHALEXIN 500 MG PO CAPS
500.0000 mg | ORAL_CAPSULE | Freq: Two times a day (BID) | ORAL | Status: DC
  Administered 2022-10-01: 500 mg via ORAL
  Filled 2022-10-01: qty 1

## 2022-10-01 MED ORDER — VANCOMYCIN HCL 1250 MG/250ML IV SOLN: 1250.0000 mg | Freq: Two times a day (BID) | INTRAVENOUS | Status: DC

## 2022-10-01 MED ORDER — PERFLUTREN LIPID MICROSPHERE
1.0000 mL | INTRAVENOUS | Status: AC | PRN
  Administered 2022-10-01: 2 mL via INTRAVENOUS

## 2022-10-01 MED ORDER — ONDANSETRON HCL 4 MG PO TABS: 4.0000 mg | ORAL_TABLET | Freq: Four times a day (QID) | ORAL | Status: DC | PRN

## 2022-10-01 NOTE — H&P (Signed)
History and Physical    Jonathan Ibarra ZOX:[id number] DOB: 1966-01-27 DOA: 09/30/2022  DOS: the patient was seen and examined on 09/30/2022  PCP: Candi Leash, MD   Patient coming from: Home  I have personally briefly reviewed patient's old medical records in [place] [facility]  CC: lying in bed for 2 days at home HPI: 57 year old male history of hepatic cirrhosis, history of hep C, history of status post TIPS, history of polysubstance abuse presents to the ER today via EMS.  Patient reports that he lives with a person named "[name]".  Does not know [name]'s last name.  EMS was called due to the patient laying in bed for 2 days.  Patient does not know why he is here.  Patient is a poor historian.  Encephalopathic.  Cannot give history.  Pt does states that he takes no medications on a regular basis. Does not see a PCP or specialist physician in several years.  On arrival he was febrile to one 1.8.  Heart rate was 173.  Blood pressure 124/86.  Initial lactic acid was 3.2. Sodium 135, potassium 3.1, chloride 100, bicarb 24, BUN 11, creatinine 0.8  Total protein 5.7, BUN 2.1, AST 49, ALT of 44, total bili 2.2  COVID-negative, influenza negative, RSV negative.  White count 7.9, hemoglobin 12.0, platelets of 92,000  Clean-catch UA showed amber-colored urine.  Large leukocyte's.  Rare bacteria.  Negative nitrites.  CT chest abdomen pelvis with IV contrast showed bilateral lower lobe atelectasis.  No pleural effusions.  Patient is status postcholecystectomy.  Status post TIPS.  No ascites.  He had some right inguinal adenopathy.  Ammonia level only 77.  Urine drug screen pending.  Initial EKG showed supraventricular tachycardia with heart rate of 186.  Patient started on IV Cardizem.  Heart rate slowed so that rapid A-fib could be seen.  [facility] hospital[facility] contacted for admission.   ED Course: EKG shows SVT. Started on cardizem gtts. Given IVF. Given IV  cefepime/vanco/flagyl  Review of Systems:  Review of Systems  Unable to perform ROS: Other  Encephalopathic/poor historian  Past Medical History:  Diagnosis Date   Alcohol abuse    Cirrhosis of liver (HCC)    Hypertension     No past surgical history on file.   reports that he has been smoking. He has never used smokeless tobacco. He reports current drug use. Drugs: Cocaine and Marijuana. He reports that he does not drink alcohol.  Allergies  Allergen Reactions   Tylenol [Acetaminophen] Other (See Comments)    "Not good for liver"    No family history on file.  Prior to Admission medications   Medication Sig Start Date End Date Taking? Authorizing Provider  furosemide (LASIX) 40 MG tablet Take 1 tablet (40 mg total) by mouth daily. Patient not taking: Reported on 04/22/2017 02/07/15   Clydia Llano, MD  mupirocin cream (BACTROBAN) 2 % Apply topically 2 (two) times daily. 04/28/17   Kathlen Mody, MD    Physical Exam: Vitals:   10/01/22 0200 10/01/22 0215 10/01/22 0230 10/01/22 0343  BP: 123/74 120/72 120/69 122/77  Pulse: 98 (!) 106 97 (!) 104  Resp: 18 19 17 20   Temp:    97.6 F (36.4 C)  TempSrc:    Oral  SpO2: 100% 99% 100% 99%    Physical Exam Vitals and nursing note reviewed.  Constitutional:      Comments: Appears chronically ill  HENT:     Head: Normocephalic.     Mouth/Throat:  Comments: Multiple dental caries Cardiovascular:     Rate and Rhythm: Tachycardia present. Rhythm irregular.  Pulmonary:     Effort: No respiratory distress.  Abdominal:     General: Bowel sounds are normal. There is no distension.  Skin:    General: Skin is warm and dry.     Capillary Refill: Capillary refill takes less than 2 seconds.     Findings: Erythema present.     Comments: Right lateral thigh with erythema but no induration. No tender to palpation  See pictures of right thigh  Neurological:     Mental Status: He is disoriented.     Comments: Appears  encephalopathic         Labs on Admission: I have personally reviewed following labs and imaging studies  CBC: Recent Labs  Lab 09/30/22 2254 09/30/22 2314 09/30/22 2315 09/30/22 2333  WBC  --   --   --  7.9  NEUTROABS  --   --   --  7.3  HGB 11.9* 12.6* 11.9* 12.0*  HCT 35.0* 37.0* 35.0* 35.6*  MCV  --   --   --  91.8  PLT  --   --   --  92*   Basic Metabolic Panel: Recent Labs  Lab 09/30/22 2253 09/30/22 2254 09/30/22 2314 09/30/22 2315  NA 135 135 135 135  K 3.1* 3.3* 3.3* 3.3*  CL 100 99  --  99  CO2 24  --   --   --   GLUCOSE 121* 118*  --  114*  BUN 11 12  --  13  CREATININE 0.84 0.60*  --  0.60*  CALCIUM 8.3*  --   --   --    GFR: CrCl cannot be calculated (Unknown ideal weight.). Liver Function Tests: Recent Labs  Lab 09/30/22 2253  AST 49*  ALT 44  ALKPHOS 72  BILITOT 2.2*  PROT 5.7*  ALBUMIN 2.1*    Recent Labs  Lab 10/01/22 0115  AMMONIA 77*   Coagulation Profile: Recent Labs  Lab 09/30/22 2253  INR 1.5*   Cardiac Enzymes: Recent Labs  Lab 09/30/22 2253 10/01/22 0115  TROPONINIHS 17 14   BNP (last 3 results) Recent Labs    09/30/22 2333  BNP 197.7*    Urine analysis:    Component Value Date/Time   COLORURINE AMBER (A) 10/01/2022 0142   APPEARANCEUR CLOUDY (A) 10/01/2022 0142   LABSPEC 1.025 10/01/2022 0142   PHURINE 5.0 10/01/2022 0142   GLUCOSEU NEGATIVE 10/01/2022 0142   HGBUR SMALL (A) 10/01/2022 0142   BILIRUBINUR NEGATIVE 10/01/2022 0142   KETONESUR NEGATIVE 10/01/2022 0142   PROTEINUR 30 (A) 10/01/2022 0142   NITRITE NEGATIVE 10/01/2022 0142   LEUKOCYTESUR LARGE (A) 10/01/2022 0142    Radiological Exams on Admission: I have personally reviewed images CT CHEST ABDOMEN PELVIS W CONTRAST  Result Date: 10/01/2022 CLINICAL DATA:  Sepsis hx ov cirrhosis with TIPS EXAM: CT CHEST, ABDOMEN, AND PELVIS WITH CONTRAST TECHNIQUE: Multidetector CT imaging of the chest, abdomen and pelvis was performed following the  standard protocol during bolus administration of intravenous contrast. RADIATION DOSE REDUCTION: This exam was performed according to the departmental dose-optimization program which includes automated exposure control, adjustment of the mA and/or kV according to patient size and/or use of iterative reconstruction technique. CONTRAST:  75mL OMNIPAQUE IOHEXOL 350 MG/ML SOLN COMPARISON:  CT abdomen pelvis 01/25/2015 FINDINGS: CT CHEST FINDINGS Cardiovascular: Normal heart size. No significant pericardial effusion. The thoracic aorta is normal in caliber. No atherosclerotic  plaque of the thoracic aorta. No coronary artery calcifications. The main pulmonary artery is normal in caliber. No central pulmonary embolus. Mediastinum/Nodes: No enlarged mediastinal, hilar, or axillary lymph nodes. Thyroid gland, trachea, and esophagus demonstrate no significant findings. Small hiatal hernia. Lungs/Pleura: Limited evaluation due to motion artifact. Bilateral lower lobe passive atelectasis. No focal consolidation. No pulmonary nodule. No pulmonary mass. Trace bilateral pleural effusions, right greater than left. No pneumothorax. Musculoskeletal: Bilateral gynecomastia. No suspicious lytic or blastic osseous lesions. Likely subacute right anterior sixth rib fracture (3:47). Chronic midthoracic vertebral body vertebral body height loss with associated exaggerated kyphosis. CT ABDOMEN PELVIS FINDINGS Hepatobiliary: Tips procedure noted. No focal liver abnormality. Status post cholecystectomy. No biliary dilatation. Pancreas: No focal lesion. Normal pancreatic contour. No surrounding inflammatory changes. No main pancreatic ductal dilatation. Spleen: Normal in size without focal abnormality. Adrenals/Urinary Tract: No adrenal nodule bilaterally. Bilateral kidneys enhance symmetrically. Punctate left nephrolithiasis No hydronephrosis. No hydroureter. The urinary bladder is unremarkable. Stomach/Bowel: Limited evaluation due to motion  artifact stomach is within normal limits. No evidence of bowel wall thickening or dilatation. Appendix appears normal. Vascular/Lymphatic: Slightly increased trace periaortic fat stranding and prominent but nonenlarged lymph nodes (3:81-91) no abdominal aorta or iliac aneurysm. Mild atherosclerotic plaque of the aorta and its branches. Right inguinal lymphadenopathy with lymph node measuring up to 1.9 cm. Associated subcutaneus soft tissue edema. No abdominalor pelvic lymphadenopathy. Reproductive: Prostate is unremarkable. Scrotum collimated off view. Other: No intraperitoneal free fluid. No intraperitoneal free gas. No organized fluid collection. Musculoskeletal: Small fat containing umbilical hernia. No suspicious lytic or blastic osseous lesions. No acute displaced fracture. Multilevel degenerative changes of the spine. IMPRESSION: 1.  Trace bilateral pleural effusions, right greater than left. 2. Right inguinal lymphadenopathy. Please note scrotum collimated off view. Recommend correlation with physical exam. 3. Slightly increased trace periaortic fat stranding and prominent but nonenlarged lymph nodes. Question developing retroperitoneal fibrosis. Recommend attention on follow-up. 4. Nonobstructive punctate left nephrolithiasis. 5. Likely subacute right anterior sixth rib fracture. Correlate with point tenderness to palpation to evaluate for an acute component. 6. TIPS procedure. Electronically Signed   By: Tish Frederickson M.D.   On: 10/01/2022 02:00   DG Chest Port 1 View  Result Date: 09/30/2022 CLINICAL DATA:  Possible sepsis fever EXAM: PORTABLE CHEST 1 VIEW COMPARISON:  02/04/2015 FINDINGS: Cardiomegaly with mild central congestion. No consolidation, pleural effusion or pneumothorax. Low lung volumes IMPRESSION: Cardiomegaly with mild central congestion. Electronically Signed   By: Jasmine Pang M.D.   On: 09/30/2022 23:18    EKG: My personal interpretation of EKG shows:  SVT   Assessment/Plan Principal Problem:   Rapid atrial fibrillation (HCC) Active Problems:   Hypokalemia   SIRS without infection with organ dysfunction (HCC)   Hepatic cirrhosis (HCC)   Polysubstance abuse (HCC)   S/P TIPS (transjugular intrahepatic portosystemic shunt)   Assessment and Plan: * Rapid atrial fibrillation (HCC) Admit to card tele. Continue with cardizem gtts. Replete K and Mg. Check echo. Check TSH. Hold off in systemic anticoagulation. Unclear if pt will have adequate followup to justify risk of systemic anticoagulation.  SIRS without infection with organ dysfunction (HCC) Check procalcitonin. No source for his fever. No ascites on CT. Doubt he has SBP. Given 1 dose of cefepime/flagyl/vanco. He has some mild erythema on right outer thigh. Not sure if this is cellulitis or not. empiric keflex 500 mg bid.  Hypokalemia Replete with IV kcl  S/P TIPS (transjugular intrahepatic portosystemic shunt) Stable. No ascites. Will check dopplers  to ensure that TIPS is patent.  Polysubstance abuse (HCC) Check UDS.  Hepatic cirrhosis (HCC) Hx of cirrhosis. Care Everywhere says that his had TIPS in 09-2014 at [facility].   DVT prophylaxis: SCDs Code Status: Full Code Family Communication: no family at bedside  Disposition Plan: return home  Consults called: none  Admission status: Inpatient, Telemetry bed   Carollee Herter, DO [facility] 10/01/2022, 4:02 AM

## 2022-10-01 NOTE — Evaluation (Signed)
Physical Therapy Evaluation Patient Details Name: Jonathan Ibarra MRN: [id number] DOB: Apr 14, 1965 Today's Date: 10/01/2022  History of Present Illness  Pt is a 57 y/o male presenting 8/1 to the ED via EMS due to AMS, pt lying in bed for 2 days.  PMH  Alcohol abuse, [name], HTN, liver cirrhosis, polysubstance abuse.  Clinical Impression  Pt admitted with/for AMS.  Pt not likely at baseline function.  Mentation has likely improved, but not likely baseline.  Pt mobilized at and min guard to light min assist level without AD.  Expect steady improvement.  Pt currently limited functionally due to the problems listed below.  (see problems list.)  Pt will benefit from PT to maximize function and safety to be able to get home safely with available assist.         If plan is discharge home, recommend the following: Other (comment) (prn help)   Can travel by private vehicle        Equipment Recommendations Other (comment) (TBD)  Recommendations for Other Services       Functional Status Assessment Patient has had a recent decline in their functional status and demonstrates the ability to make significant improvements in function in a reasonable and predictable amount of time.     Precautions / Restrictions Precautions Precautions: Fall      Mobility  Bed Mobility Overal bed mobility: Needs Assistance Bed Mobility: Supine to Sit, Sit to Supine     Supine to sit: Min guard Sit to supine: Min guard   General bed mobility comments: effortful due to discomfort    Transfers Overall transfer level: Needs assistance   Transfers: Sit to/from Stand Sit to Stand: Min guard           General transfer comment: from lower bed and lower toilet levels    Ambulation/Gait Ambulation/Gait assistance: Min assist Gait Distance (Feet): 15 Feet (x4  to/from bathroom with stability given through shoulder) Assistive device: None Gait Pattern/deviations: Step-through pattern   Gait  velocity interpretation: <1.31 ft/sec, indicative of household ambulator   General Gait Details: mild instability with weakness during n/v/d  Stairs            Wheelchair Mobility     Tilt Bed    Modified Rankin (Stroke Patients Only)       Balance Overall balance assessment: Mild deficits observed, not formally tested                                           Pertinent Vitals/Pain Pain Assessment Pain Assessment: Faces Pain Score: 4  Pain Location: upset stomach Pain Descriptors / Indicators: Aching Pain Intervention(s): Limited activity within patient's tolerance    Home Living Family/patient expects to be discharged to:: Private residence Living Arrangements: Non-relatives/Friends Available Help at Discharge: Other (Comment) (room mate PRN) Type of Home: House Home Access: Level entry       Home Layout: One level Home Equipment: None      Prior Function Prior Level of Function : Independent/Modified Independent             Mobility Comments: Independent in the home, can/doesn't drive, is  driven by others       Hand Dominance        Extremity/Trunk Assessment   Upper Extremity Assessment Upper Extremity Assessment: Generalized weakness    Lower Extremity Assessment Lower Extremity Assessment: Generalized  weakness    Cervical / Trunk Assessment Cervical / Trunk Assessment: Normal  Communication   Communication: No difficulties  Cognition Arousal/Alertness: Awake/alert Behavior During Therapy: WFL for tasks assessed/performed Overall Cognitive Status: Difficult to assess (NT formally, pt with nausea/vomiting and diarrhea and miserable.)                                          General Comments General comments (skin integrity, edema, etc.): VSS overall on RA    Exercises     Assessment/Plan    PT Assessment Patient needs continued PT services  PT Problem List Decreased strength;Decreased  activity tolerance;Decreased mobility;Decreased balance       PT Treatment Interventions Gait training;Stair training;Functional mobility training;Therapeutic activities;Balance training;Patient/family education    PT Goals (Current goals can be found in the Care Plan section)  Acute Rehab PT Goals Patient Stated Goal: agreed, needed to be independent PT Goal Formulation: With patient Time For Goal Achievement: 10/15/22 Potential to Achieve Goals: Good    Frequency Min 1X/week     Co-evaluation               AM-PAC PT "6 Clicks" Mobility  Outcome Measure Help needed turning from your back to your side while in a flat bed without using bedrails?: A Little Help needed moving from lying on your back to sitting on the side of a flat bed without using bedrails?: A Little Help needed moving to and from a bed to a chair (including a wheelchair)?: A Little Help needed standing up from a chair using your arms (e.g., wheelchair or bedside chair)?: A Little Help needed to walk in hospital room?: A Little Help needed climbing 3-5 steps with a railing? : A Little 6 Click Score: 18    End of Session   Activity Tolerance: Patient limited by fatigue Patient left: Other (comment);with call bell/phone within reach (toilet, RN and tech aware) Nurse Communication: Mobility status PT Visit Diagnosis: Unsteadiness on feet (R26.81);Muscle weakness (generalized) (M62.81)    Time: 2130-8657 PT Time Calculation (min) (ACUTE ONLY): 25 min   Charges:   PT Evaluation $PT Eval Low Complexity: 1 Low PT Treatments $Gait Training: 8-22 mins PT General Charges $$ ACUTE PT VISIT: 1 Visit         10/01/2022  Jacinto Halim., PT Acute Rehabilitation Services [phone]  (office)  [name] 10/01/2022, 5:43 PM

## 2022-10-01 NOTE — Assessment & Plan Note (Signed)
-   Check UDS

## 2022-10-01 NOTE — ED Notes (Signed)
Verified nurse is ready for patient to go to the floor

## 2022-10-01 NOTE — Assessment & Plan Note (Signed)
Replete with IV kcl 

## 2022-10-01 NOTE — Progress Notes (Signed)
  Echocardiogram 2D Echocardiogram has been performed.  Jonathan Ibarra 10/01/2022, 4:20 PM

## 2022-10-01 NOTE — Plan of Care (Signed)

## 2022-10-01 NOTE — Assessment & Plan Note (Addendum)
Admit to card tele. Continue with cardizem gtts. Replete K and Mg. Check echo. Check TSH. Hold off in systemic anticoagulation. Unclear if pt will have adequate followup to justify risk of systemic anticoagulation.

## 2022-10-01 NOTE — Progress Notes (Signed)
Pharmacy Antibiotic Note  Jonathan Ibarra is a 57 y.o. male admitted on 09/30/2022 with sepsis.  Pharmacy has been consulted for vancomycin and cefepime dosing.  Plan: Vancomycin 2g IV x1 loading dose Start vancomycin 1250 mg IV every 12 hours, eAUC 472, Scr 0.79 Start cefepime 2g q8h Draw vancomycin levels when clinically appropriate  Height: 6\' 1"  (185.4 cm) Weight: (!) 136.2 kg (300 lb 4.8 oz) IBW/kg (Calculated) : 79.9  Temp (24hrs), Avg:99.1 F (37.3 C), Min:97.6 F (36.4 C), Max:102 F (38.9 C)  Recent Labs  Lab 09/30/22 2253 09/30/22 2254 09/30/22 2255 09/30/22 2313 09/30/22 2315 09/30/22 2333 10/01/22 0005 10/01/22 0112 10/01/22 0121  WBC  --   --   --   --   --  7.9  --  6.5  --   CREATININE 0.84 0.60*  --   --  0.60*  --   --  0.79  --   LATICACIDVEN  --   --  2.7* 3.2*  --   --  2.6*  --  1.7    Estimated Creatinine Clearance: 151.1 mL/min (by C-G formula based on SCr of 0.79 mg/dL).    Allergies  Allergen Reactions   Tylenol [Acetaminophen] Other (See Comments)    "Not good for liver"    Antimicrobials this admission: cephalexin 500 mg x1 8/2 vancomycin 8/1 >>  cefepime 8/1 >>  Dose adjustments this admission: none  Microbiology results: 8/1 BCx: pending 8/2 UCx: pending   Thank you for allowing pharmacy to be a part of this patient's care.  Romie Minus, PharmD PGY1 Pharmacy Resident  Please check AMION for all [facility] Pharmacy phone numbers After 10:00 PM, call Main Pharmacy [phone]

## 2022-10-01 NOTE — Assessment & Plan Note (Addendum)
Check procalcitonin. No source for his fever. No ascites on CT. Doubt he has SBP. Given 1 dose of cefepime/flagyl/vanco. He has some mild erythema on right outer thigh. Not sure if this is cellulitis or not. empiric keflex 500 mg bid.

## 2022-10-01 NOTE — Progress Notes (Signed)
PHARMACY - PHYSICIAN COMMUNICATION CRITICAL VALUE ALERT - BLOOD CULTURE IDENTIFICATION (BCID)  Jonathan Ibarra is an 57 y.o. male who presented to [facility] on 09/30/2022 with a chief complaint of AMS  Assessment: 33 YOM who presented with AMS, fever, chills, N/V/D, hx cirrhosis/PSA. Now with 1 of 4 bottles growing streptococcus pyogenes - appears to most likely be seeding from R-thigh cellulitis.   Name of physician (or Provider) Contacted: Jomarie Longs  Current antibiotics: Cefepime + Vancomycin   Changes to prescribed antibiotics recommended:  Narrow to PCN G 12 MU q12h as a continuous infusion Linezolid 600 mg IV every 12 hours for toxin inhibition  Results for orders placed or performed during the hospital encounter of 09/30/22  Blood Culture ID Panel (Reflexed) (Collected: 09/30/2022 10:46 PM)  Result Value Ref Range   Enterococcus faecalis NOT DETECTED NOT DETECTED   Enterococcus Faecium NOT DETECTED NOT DETECTED   Listeria monocytogenes NOT DETECTED NOT DETECTED   Staphylococcus species NOT DETECTED NOT DETECTED   Staphylococcus aureus (BCID) NOT DETECTED NOT DETECTED   Staphylococcus epidermidis NOT DETECTED NOT DETECTED   Staphylococcus lugdunensis NOT DETECTED NOT DETECTED   Streptococcus species DETECTED (A) NOT DETECTED   Streptococcus agalactiae NOT DETECTED NOT DETECTED   Streptococcus pneumoniae NOT DETECTED NOT DETECTED   Streptococcus pyogenes DETECTED (A) NOT DETECTED   A.calcoaceticus-baumannii NOT DETECTED NOT DETECTED   Bacteroides fragilis NOT DETECTED NOT DETECTED   Enterobacterales NOT DETECTED NOT DETECTED   Enterobacter cloacae complex NOT DETECTED NOT DETECTED   Escherichia coli NOT DETECTED NOT DETECTED   Klebsiella aerogenes NOT DETECTED NOT DETECTED   Klebsiella oxytoca NOT DETECTED NOT DETECTED   Klebsiella pneumoniae NOT DETECTED NOT DETECTED   Proteus species NOT DETECTED NOT DETECTED   Salmonella species NOT DETECTED NOT DETECTED   Serratia  marcescens NOT DETECTED NOT DETECTED   Haemophilus influenzae NOT DETECTED NOT DETECTED   Neisseria meningitidis NOT DETECTED NOT DETECTED   Pseudomonas aeruginosa NOT DETECTED NOT DETECTED   Stenotrophomonas maltophilia NOT DETECTED NOT DETECTED   Candida albicans NOT DETECTED NOT DETECTED   Candida auris NOT DETECTED NOT DETECTED   Candida glabrata NOT DETECTED NOT DETECTED   Candida krusei NOT DETECTED NOT DETECTED   Candida parapsilosis NOT DETECTED NOT DETECTED   Candida tropicalis NOT DETECTED NOT DETECTED   Cryptococcus neoformans/gattii NOT DETECTED NOT DETECTED    Thank you for allowing pharmacy to be a part of this patient's care.  Georgina Pillion, PharmD, BCPS, BCIDP Infectious Diseases Clinical Pharmacist 10/01/2022 4:25 PM   **Pharmacist phone directory can now be found on [email or web address] ([email or web address]).  Listed under [facility].

## 2022-10-01 NOTE — Progress Notes (Signed)
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 -In the ED he was febrile to 102, tachycardic with SVT, lactate of 3.2, sodium 135, creatinine 0.8, WBC 7.9, hemoglobin 12, platelet 92 K, UA with amber-colored urine, large leukocytes, negative nitrite with rare bacteria, ammonia level 77, CT abdomen pelvis with IV contrast noted bilateral lower lobe atelectasis  Subjective: -Feels okay, more awake today, denies any localizing symptoms  Assessment and Plan:  SIRS -Presented with fever, altered mental status, lactic acidosis -Source of infection is unclear, continue broad-spectrum antibiotics, attempt ultrasound paracentesis -UA is abnormal follow-up urine cultures, follow-up blood cultures as well  SVT -Does not appear to be in A-fib -Wean off Cardizem gtt., add low-dose metoprolol  Decompensated liver cirrhosis -Hold off on diuretics at this time, resume in 1 to 2 days -History of TIPS  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, poorly compliant with diuretics and lactulose -Restart lactulose -Ambulate, PT OT eval  Chronic thrombocytopenia -Secondary to liver disease  Hypokalemia Replete with IV kcl  Polysubstance abuse (HCC) -UDS positive for cannabis and amphetamines, counseled  S DOH, poor compliance with medications, follow-up, polysubstance abuse -TOC consult  DVT prophylaxis: SCDs  Code Status: Full code Family Communication: None present Disposition Plan:   Consultants:    Procedures:   Antimicrobials:    Objective: Vitals:   10/01/22 0726 10/01/22 0819 10/01/22 0923 10/01/22 1000  BP: 122/84 110/63 125/72    Pulse: 96 99 93   Resp: 15 20 18    Temp: 98.2 F (36.8 C) 98 F (36.7 C) 98 F (36.7 C)   TempSrc: Oral Oral Oral   SpO2: 100% 100% 99%   Weight:    (!) 136.2 kg  Height:    6\' 1"  (1.854 m)    Intake/Output Summary (Last 24 hours) at 10/01/2022 1115 Last data filed at 10/01/2022 1000 Gross per 24 hour  Intake 1738.38 ml  Output 1250 ml  Net 488.38 ml   Filed Weights   10/01/22 1000  Weight: (!) 136.2 kg    Examination:  General exam: Appears calm and comfortable  Respiratory system: Clear to auscultation Cardiovascular system: S1 & S2 heard, RRR.  Abd: nondistended, soft and nontender.Normal bowel sounds heard. Central nervous system: Alert and oriented. No focal neurological deficits. Extremities: no edema Skin: No rashes Psychiatry:  Mood & affect appropriate.     Data Reviewed:   CBC: Recent Labs  Lab 09/30/22 2254 09/30/22 2314 09/30/22 2315 09/30/22 2333 10/01/22 0112  WBC  --   --   --  7.9 6.5  NEUTROABS  --   --   --  7.3 5.8  HGB 11.9* 12.6* 11.9* 12.0* 12.4*  HCT 35.0* 37.0* 35.0* 35.6* 37.2*  MCV  --   --   --  91.8 92.1  PLT  --   --   --  92* 79*   Basic Metabolic Panel: Recent Labs  Lab 09/30/22 2253 09/30/22 2254 09/30/22 2314 09/30/22 2315  NA 135 135 135 135  K 3.1* 3.3* 3.3* 3.3*  CL 100 99  --  99  CO2 24  --   --   --   GLUCOSE 121* 118*  --  114*  BUN 11 12  --  13  CREATININE 0.84 0.60*  --  0.60*  CALCIUM 8.3*  --   --   --    GFR: Estimated Creatinine Clearance: 151.1 mL/min (A) (by C-G formula based on SCr of 0.6 mg/dL (L)). Liver Function Tests: Recent Labs  Lab 09/30/22 2253  AST 49*  ALT 44  ALKPHOS 72  BILITOT 2.2*  PROT 5.7*  ALBUMIN 2.1*   No results for input(s): "LIPASE", "AMYLASE" in the last 168 hours. Recent Labs  Lab 10/01/22 0115  AMMONIA 77*   Coagulation Profile: Recent Labs  Lab 09/30/22 2253  INR 1.5*   Cardiac Enzymes: No results for input(s): "CKTOTAL", "CKMB", "CKMBINDEX",  "TROPONINI" in the last 168 hours. BNP (last 3 results) No results for input(s): "PROBNP" in the last 8760 hours. HbA1C: No results for input(s): "HGBA1C" in the last 72 hours. CBG: No results for input(s): "GLUCAP" in the last 168 hours. Lipid Profile: No results for input(s): "CHOL", "HDL", "LDLCALC", "TRIG", "CHOLHDL", "LDLDIRECT" in the last 72 hours. Thyroid Function Tests: No results for input(s): "TSH", "T4TOTAL", "FREET4", "T3FREE", "THYROIDAB" in the last 72 hours. Anemia Panel: No results for input(s): "VITAMINB12", "FOLATE", "FERRITIN", "TIBC", "IRON", "RETICCTPCT" in the last 72 hours. Urine analysis:    Component Value Date/Time   COLORURINE AMBER (A) 10/01/2022 0142   APPEARANCEUR CLOUDY (A) 10/01/2022 0142   LABSPEC 1.025 10/01/2022 0142   PHURINE 5.0 10/01/2022 0142   GLUCOSEU NEGATIVE 10/01/2022 0142   HGBUR SMALL (A) 10/01/2022 0142   BILIRUBINUR NEGATIVE 10/01/2022 0142   KETONESUR NEGATIVE 10/01/2022 0142   PROTEINUR 30 (A) 10/01/2022 0142   NITRITE NEGATIVE 10/01/2022 0142   LEUKOCYTESUR LARGE (A) 10/01/2022 0142   Sepsis Labs: @LABRCNTIP (procalcitonin:4,lacticidven:4)  ) Recent Results (from the past 240 hour(s))  Blood Culture (routine x 2)     Status: None (Preliminary result)   Collection Time: 09/30/22 10:46 PM   Specimen: BLOOD  Result Value Ref Range Status   Specimen Description BLOOD LEFT ANTECUBITAL  Final   Special Requests   Final    BOTTLES DRAWN AEROBIC AND ANAEROBIC Blood Culture adequate volume   Culture   Final    NO GROWTH < 12 HOURS Performed at [facility], [address] [place], [place] [address]    Report Status PENDING  Incomplete  Blood Culture (routine x 2)     Status: None (Preliminary result)   Collection Time: 09/30/22 10:53 PM   Specimen: BLOOD  Result Value Ref Range Status   Specimen Description BLOOD RIGHT ANTECUBITAL  Final   Special Requests   Final    BOTTLES DRAWN AEROBIC AND ANAEROBIC Blood Culture  adequate volume   Culture   Final    NO GROWTH < 12 HOURS Performed at [facility], [address] [place], [place] [address]    Report Status PENDING  Incomplete  Resp panel by RT-PCR (RSV, Flu A&B, Covid) Anterior Nasal Swab     Status: None   Collection Time: 09/30/22 10:56 PM   Specimen: Anterior Nasal Swab  Result Value Ref Range Status   SARS Coronavirus 2 by RT PCR NEGATIVE NEGATIVE Final   Influenza A by PCR NEGATIVE NEGATIVE Final   Influenza B by PCR NEGATIVE NEGATIVE Final    Comment: (NOTE) The Xpert Xpress SARS-CoV-2/FLU/RSV plus assay is intended as an aid in the diagnosis of influenza from Nasopharyngeal swab specimens and should not be used as a sole basis for treatment. Nasal  washings and aspirates are unacceptable for Xpert Xpress SARS-CoV-2/FLU/RSV testing.  Fact Sheet for Patients: BloggerCourse.com  Fact Sheet for Healthcare Providers: SeriousBroker.it  This test is not yet approved or cleared by the [place] FDA and has been authorized for detection and/or diagnosis of SARS-CoV-2 by FDA under an Emergency Use Authorization (EUA). This EUA will remain in effect (meaning this test can be used) for the duration of the COVID-19 declaration under Section 564(b)(1) of the Act, 21 U.S.C. section 360bbb-3(b)(1), unless the authorization is terminated or revoked.     Resp Syncytial Virus by PCR NEGATIVE NEGATIVE Final    Comment: (NOTE) Fact Sheet for Patients: BloggerCourse.com  Fact Sheet for Healthcare Providers: SeriousBroker.it  This test is not yet approved or cleared by the [place] FDA and has been authorized for detection and/or diagnosis of SARS-CoV-2 by FDA under an Emergency Use Authorization (EUA). This EUA will remain in effect (meaning this test can be used) for the duration of the COVID-19 declaration under Section 564(b)(1) of  the Act, 21 U.S.C. section 360bbb-3(b)(1), unless the authorization is terminated or revoked.  Performed at [facility], [address] [place], [place] [address]      Radiology Studies: CT CHEST ABDOMEN PELVIS W CONTRAST  Result Date: 10/01/2022 CLINICAL DATA:  Sepsis hx ov cirrhosis with TIPS EXAM: CT CHEST, ABDOMEN, AND PELVIS WITH CONTRAST TECHNIQUE: Multidetector CT imaging of the chest, abdomen and pelvis was performed following the standard protocol during bolus administration of intravenous contrast. RADIATION DOSE REDUCTION: This exam was performed according to the departmental dose-optimization program which includes automated exposure control, adjustment of the mA and/or kV according to patient size and/or use of iterative reconstruction technique. CONTRAST:  75mL OMNIPAQUE IOHEXOL 350 MG/ML SOLN COMPARISON:  CT abdomen pelvis 01/25/2015 FINDINGS: CT CHEST FINDINGS Cardiovascular: Normal heart size. No significant pericardial effusion. The thoracic aorta is normal in caliber. No atherosclerotic plaque of the thoracic aorta. No coronary artery calcifications. The main pulmonary artery is normal in caliber. No central pulmonary embolus. Mediastinum/Nodes: No enlarged mediastinal, hilar, or axillary lymph nodes. Thyroid gland, trachea, and esophagus demonstrate no significant findings. Small hiatal hernia. Lungs/Pleura: Limited evaluation due to motion artifact. Bilateral lower lobe passive atelectasis. No focal consolidation. No pulmonary nodule. No pulmonary mass. Trace bilateral pleural effusions, right greater than left. No pneumothorax. Musculoskeletal: Bilateral gynecomastia. No suspicious lytic or blastic osseous lesions. Likely subacute right anterior sixth rib fracture (3:47). Chronic midthoracic vertebral body vertebral body height loss with associated exaggerated kyphosis. CT ABDOMEN PELVIS FINDINGS Hepatobiliary: Tips procedure noted. No focal liver abnormality. Status post  cholecystectomy. No biliary dilatation. Pancreas: No focal lesion. Normal pancreatic contour. No surrounding inflammatory changes. No main pancreatic ductal dilatation. Spleen: Normal in size without focal abnormality. Adrenals/Urinary Tract: No adrenal nodule bilaterally. Bilateral kidneys enhance symmetrically. Punctate left nephrolithiasis No hydronephrosis. No hydroureter. The urinary bladder is unremarkable. Stomach/Bowel: Limited evaluation due to motion artifact stomach is within normal limits. No evidence of bowel wall thickening or dilatation. Appendix appears normal. Vascular/Lymphatic: Slightly increased trace periaortic fat stranding and prominent but nonenlarged lymph nodes (3:81-91) no abdominal aorta or iliac aneurysm. Mild atherosclerotic plaque of the aorta and its branches. Right inguinal lymphadenopathy with lymph node measuring up to 1.9 cm. Associated subcutaneus soft tissue edema. No abdominalor pelvic lymphadenopathy. Reproductive: Prostate is unremarkable. Scrotum collimated off view. Other: No intraperitoneal free fluid. No intraperitoneal free gas. No organized fluid collection. Musculoskeletal: Small fat containing umbilical hernia. No suspicious lytic or blastic osseous  lesions. No acute displaced fracture. Multilevel degenerative changes of the spine. IMPRESSION: 1.  Trace bilateral pleural effusions, right greater than left. 2. Right inguinal lymphadenopathy. Please note scrotum collimated off view. Recommend correlation with physical exam. 3. Slightly increased trace periaortic fat stranding and prominent but nonenlarged lymph nodes. Question developing retroperitoneal fibrosis. Recommend attention on follow-up. 4. Nonobstructive punctate left nephrolithiasis. 5. Likely subacute right anterior sixth rib fracture. Correlate with point tenderness to palpation to evaluate for an acute component. 6. TIPS procedure. Electronically Signed   By: Tish Frederickson M.D.   On: 10/01/2022 02:00    DG Chest Port 1 View  Result Date: 09/30/2022 CLINICAL DATA:  Possible sepsis fever EXAM: PORTABLE CHEST 1 VIEW COMPARISON:  02/04/2015 FINDINGS: Cardiomegaly with mild central congestion. No consolidation, pleural effusion or pneumothorax. Low lung volumes IMPRESSION: Cardiomegaly with mild central congestion. Electronically Signed   By: Jasmine Pang M.D.   On: 09/30/2022 23:18     Scheduled Meds:  lactulose  30 g Oral BID   Continuous Infusions:  diltiazem (CARDIZEM) infusion 15 mg/hr (10/01/22 0741)     LOS: 0 days    Time spent:    Zannie Cove, MD [facility]   10/01/2022, 11:15 AM

## 2022-10-01 NOTE — Assessment & Plan Note (Signed)
Hx of cirrhosis. Care Everywhere says that his had TIPS in 09-2014 at [facility].

## 2022-10-01 NOTE — Assessment & Plan Note (Signed)
Stable. No ascites. Will check dopplers to ensure that TIPS is patent.

## 2022-10-01 NOTE — ED Notes (Signed)
ED TO INPATIENT HANDOFF REPORT  ED Nurse Name and Phone #: Lew Dawes RN  [phone]  S Name/Age/Gender Jonathan Ibarra 57 y.o. male Room/Bed: 004C/004C  Code Status   Code Status: Full Code  Home/SNF/Other Home Patient oriented to: self, place, and situation Is this baseline? No   Triage Complete: Triage complete  Chief Complaint Rapid atrial fibrillation [facility]) [I48.91]  Triage Note Patient BIB [facility] from home, patient had been laying on chair x2 days, fever, chills, AMS. A&Ox1, open wounds on legs x62months ago finish ABT therapy for wounds. 101.3, CBG 116, 127/51, HR118-160, 18g LAC 500IVF, Tylenol 650mg , Cap 25. Patient baseline is A&Ox4   Allergies Allergies  Allergen Reactions   Tylenol [Acetaminophen] Other (See Comments)    "Not good for liver"    Level of Care/Admitting Diagnosis ED Disposition     ED Disposition  Admit   Condition  --   Comment  Hospital Area: [facility] [[id number]]  Level of Care: Telemetry Cardiac [[id number]]  May admit patient to [facility] or [facility] if equivalent level of care is available:: No  Covid Evaluation: Asymptomatic - no recent exposure (last 10 days) testing not required  Diagnosis: Rapid atrial fibrillation [facility]) [[id number]]  Admitting Physician: Imogene Burn ERIC [[id number]]  Attending Physician: Imogene Burn, ERIC [[id number]]  Certification:: I certify this patient will need inpatient services for at least 2 midnights  Estimated Length of Stay: 4          B Medical/Surgery History Past Medical History:  Diagnosis Date   Alcohol abuse    Cirrhosis of liver (HCC)    Hypertension    No past surgical history on file.   A IV Location/Drains/Wounds Patient Lines/Drains/Airways Status     Active Line/Drains/Airways     Name Placement date Placement time Site Days   Peripheral IV 09/30/22 18 G Right Antecubital 09/30/22  2300  Antecubital  1   Peripheral IV 09/30/22 18 G Anterior;Distal;Left;Upper Arm 09/30/22  2302  Arm   1   External Urinary Catheter 10/01/22  0316  --  less than 1   Incision (Closed) 04/23/17 Arm Left 04/23/17  1930  -- 1987            Intake/Output Last 24 hours  Intake/Output Summary (Last 24 hours) at 10/01/2022 8413 Last data filed at 10/01/2022 0206 Gross per 24 hour  Intake 1498.38 ml  Output 700 ml  Net 798.38 ml    Labs/Imaging Results for orders placed or performed during the hospital encounter of 09/30/22 (from the past 48 hour(s))  Blood Culture (routine x 2)     Status: None (Preliminary result)   Collection Time: 09/30/22 10:46 PM   Specimen: BLOOD  Result Value Ref Range   Specimen Description BLOOD LEFT ANTECUBITAL    Special Requests      BOTTLES DRAWN AEROBIC AND ANAEROBIC Blood Culture adequate volume   Culture      NO GROWTH < 12 HOURS Performed at [facility], [address], [place], [place] [address]    Report Status PENDING   Comprehensive metabolic panel     Status: Abnormal   Collection Time: 09/30/22 10:53 PM  Result Value Ref Range   Sodium 135 135 - 145 mmol/L   Potassium 3.1 (L) 3.5 - 5.1 mmol/L   Chloride 100 98 - 111 mmol/L   CO2 24 22 - 32 mmol/L   Glucose, Bld 121 (H) 70 - 99 mg/dL    Comment: Glucose reference range  applies only to samples taken after fasting for at least 8 hours.   BUN 11 6 - 20 mg/dL   Creatinine, Ser 9.60 0.61 - 1.24 mg/dL   Calcium 8.3 (L) 8.9 - 10.3 mg/dL   Total Protein 5.7 (L) 6.5 - 8.1 g/dL   Albumin 2.1 (L) 3.5 - 5.0 g/dL   AST 49 (H) 15 - 41 U/L   ALT 44 0 - 44 U/L   Alkaline Phosphatase 72 38 - 126 U/L   Total Bilirubin 2.2 (H) 0.3 - 1.2 mg/dL   GFR, Estimated >45 >40 mL/min    Comment: (NOTE) Calculated using the CKD-EPI Creatinine Equation (2021)    Anion gap 11 5 - 15    Comment: Performed at [facility], [address] [place], [place] [address]  Protime-INR     Status: Abnormal   Collection Time: 09/30/22 10:53 PM  Result Value Ref Range   Prothrombin Time 18.1 (H) 11.4 - 15.2  seconds   INR 1.5 (H) 0.8 - 1.2    Comment: (NOTE) INR goal varies based on device and disease states. Performed at [facility], [address], [place], [place] [address]   APTT     Status: None   Collection Time: 09/30/22 10:53 PM  Result Value Ref Range   aPTT 29 24 - 36 seconds    Comment: Performed at [facility], [address], [place], [place] [address]  Blood Culture (routine x 2)     Status: None (Preliminary result)   Collection Time: 09/30/22 10:53 PM   Specimen: BLOOD  Result Value Ref Range   Specimen Description BLOOD RIGHT ANTECUBITAL    Special Requests      BOTTLES DRAWN AEROBIC AND ANAEROBIC Blood Culture adequate volume   Culture      NO GROWTH < 12 HOURS Performed at [facility], [address] [place], [place] [address]    Report Status PENDING   Troponin I (High Sensitivity)     Status: None   Collection Time: 09/30/22 10:53 PM  Result Value Ref Range   Troponin I (High Sensitivity) 17 <18 ng/L    Comment: (NOTE) Elevated high sensitivity troponin I (hsTnI) values and significant  changes across serial measurements may suggest ACS but many other  chronic and acute conditions are known to elevate hsTnI results.  Refer to the "Links" section for chest pain algorithms and additional  guidance. Performed at [facility], [address] [place], [place] [address]   I-stat chem 8, ED (not at [facility], [facility] or [facility])     Status: Abnormal   Collection Time: 09/30/22 10:54 PM  Result Value Ref Range   Sodium 135 135 - 145 mmol/L   Potassium 3.3 (L) 3.5 - 5.1 mmol/L   Chloride 99 98 - 111 mmol/L   BUN 12 6 - 20 mg/dL   Creatinine, Ser 6.29 (L) 0.61 - 1.24 mg/dL   Glucose, Bld 528 (H) 70 - 99 mg/dL    Comment: Glucose reference range applies only to samples taken after fasting for at least 8 hours.   Calcium, Ion 1.10 (L) 1.15 - 1.40 mmol/L   TCO2 23 22 - 32 mmol/L   Hemoglobin 11.9 (L) 13.0 - 17.0 g/dL   HCT 41.3 (L) 24.4 - 01.0 %   I-Stat CG4 Lactic Acid     Status: Abnormal   Collection Time: 09/30/22 10:55 PM  Result Value Ref Range   Lactic Acid,  Venous 2.7 (HH) 0.5 - 1.9 mmol/L   Comment NOTIFIED PHYSICIAN   Resp panel by RT-PCR (RSV, Flu A&B, Covid) Anterior Nasal Swab     Status: None   Collection Time: 09/30/22 10:56 PM   Specimen: Anterior Nasal Swab  Result Value Ref Range   SARS Coronavirus 2 by RT PCR NEGATIVE NEGATIVE   Influenza A by PCR NEGATIVE NEGATIVE   Influenza B by PCR NEGATIVE NEGATIVE    Comment: (NOTE) The Xpert Xpress SARS-CoV-2/FLU/RSV plus assay is intended as an aid in the diagnosis of influenza from Nasopharyngeal swab specimens and should not be used as a sole basis for treatment. Nasal washings and aspirates are unacceptable for Xpert Xpress SARS-CoV-2/FLU/RSV testing.  Fact Sheet for Patients: BloggerCourse.com  Fact Sheet for Healthcare Providers: SeriousBroker.it  This test is not yet approved or cleared by the [place] FDA and has been authorized for detection and/or diagnosis of SARS-CoV-2 by FDA under an Emergency Use Authorization (EUA). This EUA will remain in effect (meaning this test can be used) for the duration of the COVID-19 declaration under Section 564(b)(1) of the Act, 21 U.S.C. section 360bbb-3(b)(1), unless the authorization is terminated or revoked.     Resp Syncytial Virus by PCR NEGATIVE NEGATIVE    Comment: (NOTE) Fact Sheet for Patients: BloggerCourse.com  Fact Sheet for Healthcare Providers: SeriousBroker.it  This test is not yet approved or cleared by the [place] FDA and has been authorized for detection and/or diagnosis of SARS-CoV-2 by FDA under an Emergency Use Authorization (EUA). This EUA will remain in effect (meaning this test can be used) for the duration of the COVID-19 declaration under Section 564(b)(1) of the Act, 21  U.S.C. section 360bbb-3(b)(1), unless the authorization is terminated or revoked.  Performed at [facility], [address] [place], [place] [address]   I-Stat Lactic Acid, ED     Status: Abnormal   Collection Time: 09/30/22 11:13 PM  Result Value Ref Range   Lactic Acid, Venous 3.2 (HH) 0.5 - 1.9 mmol/L   Comment NOTIFIED PHYSICIAN   I-Stat venous blood gas, ([facility] ED, [facility], [facility])     Status: Abnormal   Collection Time: 09/30/22 11:14 PM  Result Value Ref Range   pH, Ven 7.500 (H) 7.25 - 7.43   pCO2, Ven 30.6 (L) 44 - 60 mmHg   pO2, Ven 112 (H) 32 - 45 mmHg   Bicarbonate 23.9 20.0 - 28.0 mmol/L   TCO2 25 22 - 32 mmol/L   O2 Saturation 99 %   Acid-Base Excess 1.0 0.0 - 2.0 mmol/L   Sodium 135 135 - 145 mmol/L   Potassium 3.3 (L) 3.5 - 5.1 mmol/L   Calcium, Ion 1.10 (L) 1.15 - 1.40 mmol/L   HCT 37.0 (L) 39.0 - 52.0 %   Hemoglobin 12.6 (L) 13.0 - 17.0 g/dL   Sample type VENOUS   I-stat chem 8, ed     Status: Abnormal   Collection Time: 09/30/22 11:15 PM  Result Value Ref Range   Sodium 135 135 - 145 mmol/L   Potassium 3.3 (L) 3.5 - 5.1 mmol/L   Chloride 99 98 - 111 mmol/L   BUN 13 6 - 20 mg/dL   Creatinine, Ser 7.82 (L) 0.61 - 1.24 mg/dL   Glucose, Bld 956 (H) 70 - 99 mg/dL    Comment: Glucose reference range applies only to samples taken after fasting for at least 8 hours.   Calcium, Ion 1.10 (L) 1.15 - 1.40 mmol/L  TCO2 24 22 - 32 mmol/L   Hemoglobin 11.9 (L) 13.0 - 17.0 g/dL   HCT 29.5 (L) 62.1 - 30.8 %  Brain natriuretic peptide     Status: Abnormal   Collection Time: 09/30/22 11:33 PM  Result Value Ref Range   B Natriuretic Peptide 197.7 (H) 0.0 - 100.0 pg/mL    Comment: Performed at [facility], [address] [place], [place] [address]  CBC with Differential/Platelet     Status: Abnormal   Collection Time: 09/30/22 11:33 PM  Result Value Ref Range   WBC 7.9 4.0 - 10.5 K/uL   RBC 3.88 (L) 4.22 - 5.81 MIL/uL   Hemoglobin 12.0 (L) 13.0 - 17.0 g/dL    HCT 69.6 (L) 29.5 - 52.0 %   MCV 91.8 80.0 - 100.0 fL   MCH 30.9 26.0 - 34.0 pg   MCHC 33.7 30.0 - 36.0 g/dL   RDW 28.4 13.2 - 44.0 %   Platelets 92 (L) 150 - 400 K/uL    Comment: Immature Platelet Fraction may be clinically indicated, consider ordering this additional test [id number] REPEATED TO VERIFY    nRBC 0.0 0.0 - 0.2 %   Neutrophils Relative % 92 %   Neutro Abs 7.3 1.7 - 7.7 K/uL   Lymphocytes Relative 3 %   Lymphs Abs 0.2 (L) 0.7 - 4.0 K/uL   Monocytes Relative 4 %   Monocytes Absolute 0.3 0.1 - 1.0 K/uL   Eosinophils Relative 0 %   Eosinophils Absolute 0.0 0.0 - 0.5 K/uL   Basophils Relative 0 %   Basophils Absolute 0.0 0.0 - 0.1 K/uL   Immature Granulocytes 1 %   Abs Immature Granulocytes 0.09 (H) 0.00 - 0.07 K/uL    Comment: Performed at [facility], [address], [place], [place] [address]  I-Stat CG4 Lactic Acid     Status: Abnormal   Collection Time: 10/01/22 12:05 AM  Result Value Ref Range   Lactic Acid, Venous 2.6 (HH) 0.5 - 1.9 mmol/L   Comment NOTIFIED PHYSICIAN   Ammonia     Status: Abnormal   Collection Time: 10/01/22  1:15 AM  Result Value Ref Range   Ammonia 77 (H) 9 - 35 umol/L    Comment: Performed at [facility], [address] [place], [place] [address]  Troponin I (High Sensitivity)     Status: None   Collection Time: 10/01/22  1:15 AM  Result Value Ref Range   Troponin I (High Sensitivity) 14 <18 ng/L    Comment: (NOTE) Elevated high sensitivity troponin I (hsTnI) values and significant  changes across serial measurements may suggest ACS but many other  chronic and acute conditions are known to elevate hsTnI results.  Refer to the "Links" section for chest pain algorithms and additional  guidance. Performed at [facility], [address] [place], [place] [address]   I-Stat CG4 Lactic Acid     Status: None   Collection Time: 10/01/22  1:21 AM  Result Value Ref Range   Lactic Acid, Venous 1.7 0.5 - 1.9 mmol/L   Urinalysis, w/ Reflex to Culture (Infection Suspected) -Urine, Clean Catch     Status: Abnormal   Collection Time: 10/01/22  1:42 AM  Result Value Ref Range   Specimen Source URINE, CLEAN CATCH    Color, Urine AMBER (A) YELLOW    Comment: BIOCHEMICALS MAY BE AFFECTED BY COLOR   APPearance CLOUDY (A) CLEAR   Specific Gravity, Urine 1.025 1.005 -  1.030   pH 5.0 5.0 - 8.0   Glucose, UA NEGATIVE NEGATIVE mg/dL   Hgb urine dipstick SMALL (A) NEGATIVE   Bilirubin Urine NEGATIVE NEGATIVE   Ketones, ur NEGATIVE NEGATIVE mg/dL   Protein, ur 30 (A) NEGATIVE mg/dL   Nitrite NEGATIVE NEGATIVE   Leukocytes,Ua LARGE (A) NEGATIVE   RBC / HPF 21-50 0 - 5 RBC/hpf   WBC, UA >50 0 - 5 WBC/hpf    Comment:        Reflex urine culture not performed if WBC <=10, OR if Squamous epithelial cells >5. If Squamous epithelial cells >5 suggest recollection.    Bacteria, UA RARE (A) NONE SEEN   Squamous Epithelial / HPF 0-5 0 - 5 /HPF   Mucus PRESENT     Comment: Performed at [facility], [address], [place], [place] [address]  Rapid urine drug screen (hospital performed)     Status: Abnormal   Collection Time: 10/01/22  1:45 AM  Result Value Ref Range   Opiates NONE DETECTED NONE DETECTED   Cocaine NONE DETECTED NONE DETECTED   Benzodiazepines NONE DETECTED NONE DETECTED   Amphetamines POSITIVE (A) NONE DETECTED   Tetrahydrocannabinol POSITIVE (A) NONE DETECTED   Barbiturates NONE DETECTED NONE DETECTED    Comment: (NOTE) DRUG SCREEN FOR MEDICAL PURPOSES ONLY.  IF CONFIRMATION IS NEEDED FOR ANY PURPOSE, NOTIFY LAB WITHIN 5 DAYS.  LOWEST DETECTABLE LIMITS FOR URINE DRUG SCREEN Drug Class                     Cutoff (ng/mL) Amphetamine and metabolites    1000 Barbiturate and metabolites    200 Benzodiazepine                 200 Opiates and metabolites        300 Cocaine and metabolites        300 THC                            50 Performed at [facility], [address],  [place], [place] [address]   Procalcitonin     Status: None   Collection Time: 10/01/22  4:54 AM  Result Value Ref Range   Procalcitonin 0.97 ng/mL    Comment:        Interpretation: PCT > 0.5 ng/mL and <= 2 ng/mL: Systemic infection (sepsis) is possible, but other conditions are known to elevate PCT as well. (NOTE)       Sepsis PCT Algorithm           Lower Respiratory Tract                                      Infection PCT Algorithm    ----------------------------     ----------------------------         PCT < 0.25 ng/mL                PCT < 0.10 ng/mL          Strongly encourage             Strongly discourage   discontinuation of antibiotics    initiation of antibiotics    ----------------------------     -----------------------------       PCT 0.25 - 0.50 ng/mL  PCT 0.10 - 0.25 ng/mL               OR       >80% decrease in PCT            Discourage initiation of                                            antibiotics      Encourage discontinuation           of antibiotics    ----------------------------     -----------------------------         PCT >= 0.50 ng/mL              PCT 0.26 - 0.50 ng/mL                AND       <80% decrease in PCT             Encourage initiation of                                             antibiotics       Encourage continuation           of antibiotics    ----------------------------     -----------------------------        PCT >= 0.50 ng/mL                  PCT > 0.50 ng/mL               AND         increase in PCT                  Strongly encourage                                      initiation of antibiotics    Strongly encourage escalation           of antibiotics                                     -----------------------------                                           PCT <= 0.25 ng/mL                                                 OR                                        > 80% decrease in PCT  Discontinue / Do not initiate                                             antibiotics  Performed at [facility], [address] [place], [place] [address]   Ethanol     Status: None   Collection Time: 10/01/22  4:54 AM  Result Value Ref Range   Alcohol, Ethyl (B) <10 <10 mg/dL    Comment: (NOTE) Lowest detectable limit for serum alcohol is 10 mg/dL.  For medical purposes only. Performed at [facility], [address], [place], [place] [address]    CT CHEST ABDOMEN PELVIS W CONTRAST  Result Date: 10/01/2022 CLINICAL DATA:  Sepsis hx ov cirrhosis with TIPS EXAM: CT CHEST, ABDOMEN, AND PELVIS WITH CONTRAST TECHNIQUE: Multidetector CT imaging of the chest, abdomen and pelvis was performed following the standard protocol during bolus administration of intravenous contrast. RADIATION DOSE REDUCTION: This exam was performed according to the departmental dose-optimization program which includes automated exposure control, adjustment of the mA and/or kV according to patient size and/or use of iterative reconstruction technique. CONTRAST:  75mL OMNIPAQUE IOHEXOL 350 MG/ML SOLN COMPARISON:  CT abdomen pelvis 01/25/2015 FINDINGS: CT CHEST FINDINGS Cardiovascular: Normal heart size. No significant pericardial effusion. The thoracic aorta is normal in caliber. No atherosclerotic plaque of the thoracic aorta. No coronary artery calcifications. The main pulmonary artery is normal in caliber. No central pulmonary embolus. Mediastinum/Nodes: No enlarged mediastinal, hilar, or axillary lymph nodes. Thyroid gland, trachea, and esophagus demonstrate no significant findings. Small hiatal hernia. Lungs/Pleura: Limited evaluation due to motion artifact. Bilateral lower lobe passive atelectasis. No focal consolidation. No pulmonary nodule. No pulmonary mass. Trace bilateral pleural effusions, right greater than left. No pneumothorax. Musculoskeletal: Bilateral gynecomastia. No suspicious lytic or  blastic osseous lesions. Likely subacute right anterior sixth rib fracture (3:47). Chronic midthoracic vertebral body vertebral body height loss with associated exaggerated kyphosis. CT ABDOMEN PELVIS FINDINGS Hepatobiliary: Tips procedure noted. No focal liver abnormality. Status post cholecystectomy. No biliary dilatation. Pancreas: No focal lesion. Normal pancreatic contour. No surrounding inflammatory changes. No main pancreatic ductal dilatation. Spleen: Normal in size without focal abnormality. Adrenals/Urinary Tract: No adrenal nodule bilaterally. Bilateral kidneys enhance symmetrically. Punctate left nephrolithiasis No hydronephrosis. No hydroureter. The urinary bladder is unremarkable. Stomach/Bowel: Limited evaluation due to motion artifact stomach is within normal limits. No evidence of bowel wall thickening or dilatation. Appendix appears normal. Vascular/Lymphatic: Slightly increased trace periaortic fat stranding and prominent but nonenlarged lymph nodes (3:81-91) no abdominal aorta or iliac aneurysm. Mild atherosclerotic plaque of the aorta and its branches. Right inguinal lymphadenopathy with lymph node measuring up to 1.9 cm. Associated subcutaneus soft tissue edema. No abdominalor pelvic lymphadenopathy. Reproductive: Prostate is unremarkable. Scrotum collimated off view. Other: No intraperitoneal free fluid. No intraperitoneal free gas. No organized fluid collection. Musculoskeletal: Small fat containing umbilical hernia. No suspicious lytic or blastic osseous lesions. No acute displaced fracture. Multilevel degenerative changes of the spine. IMPRESSION: 1.  Trace bilateral pleural effusions, right greater than left. 2. Right inguinal lymphadenopathy. Please note scrotum collimated off view. Recommend correlation with physical exam. 3. Slightly increased trace periaortic fat stranding and prominent but nonenlarged lymph nodes. Question developing retroperitoneal fibrosis. Recommend attention on  follow-up. 4. Nonobstructive punctate left nephrolithiasis. 5. Likely subacute right anterior sixth rib fracture. Correlate with point tenderness to palpation to evaluate for  an acute component. 6. TIPS procedure. Electronically Signed   By: Tish Frederickson M.D.   On: 10/01/2022 02:00   DG Chest Port 1 View  Result Date: 09/30/2022 CLINICAL DATA:  Possible sepsis fever EXAM: PORTABLE CHEST 1 VIEW COMPARISON:  02/04/2015 FINDINGS: Cardiomegaly with mild central congestion. No consolidation, pleural effusion or pneumothorax. Low lung volumes IMPRESSION: Cardiomegaly with mild central congestion. Electronically Signed   By: Jasmine Pang M.D.   On: 09/30/2022 23:18    Pending Labs Unresulted Labs (From admission, onward)     Start     Ordered   10/01/22 0142  Urine Culture  Once,   R        10/01/22 0142   09/30/22 2256  CBC with Differential  (Septic presentation on arrival (screening labs, nursing and treatment orders for obvious sepsis))  ONCE - STAT,   STAT        09/30/22 2255   Signed and Held  HIV Antibody (routine testing w rflx)  (HIV Antibody (Routine testing w reflex) panel)  Add-on,   R        Signed and Held   Signed and Held  TSH  Add-on,   R        Signed and Held   Signed and Held  Comprehensive metabolic panel  Tomorrow morning,   R        Signed and Held   Signed and Held  Magnesium  Tomorrow morning,   R        Signed and Held   Signed and Held  CBC with Differential/Platelet  Tomorrow morning,   R        Signed and Held            Vitals/Pain Today's Vitals   10/01/22 0230 10/01/22 0343 10/01/22 0726 10/01/22 0819  BP: 120/69 122/77 122/84 110/63  Pulse: 97 (!) 104 96 99  Resp: 17 20 15 20   Temp:  97.6 F (36.4 C) 98.2 F (36.8 C) 98 F (36.7 C)  TempSrc:  Oral Oral Oral  SpO2: 100% 99% 100% 100%    Isolation Precautions No active isolations  Medications Medications  diltiazem (CARDIZEM) 1 mg/mL load via infusion 10 mg (10 mg Intravenous Bolus from  Bag 09/30/22 2339)    And  diltiazem (CARDIZEM) 125 mg in dextrose 5% 125 mL (1 mg/mL) infusion (15 mg/hr Intravenous New Bag/Given 10/01/22 0741)  potassium chloride 10 mEq in 100 mL IVPB (10 mEq Intravenous New Bag/Given 10/01/22 0734)  lactulose (CHRONULAC) 10 GM/15ML solution 30 g (has no administration in time range)  lactated ringers bolus 1,000 mL (0 mLs Intravenous Stopped 09/30/22 2341)  ceFEPIme (MAXIPIME) 2 g in sodium chloride 0.9 % 100 mL IVPB (0 g Intravenous Stopped 09/30/22 2349)  metroNIDAZOLE (FLAGYL) IVPB 500 mg (0 mg Intravenous Stopped 10/01/22 0132)  vancomycin (VANCOREADY) IVPB 2000 mg/400 mL (0 mg Intravenous Stopped 10/01/22 0206)  ketorolac (TORADOL) 15 MG/ML injection 15 mg (15 mg Intravenous Given 09/30/22 2311)  iohexol (OMNIPAQUE) 350 MG/ML injection 75 mL (75 mLs Intravenous Contrast Given 10/01/22 0110)  magnesium sulfate IVPB 2 g 50 mL (0 g Intravenous Stopped 10/01/22 2956)    Mobility walks     Focused Assessments Neuro Assessment Handoff:  Swallow screen pass?  Cardiac Rhythm: (S) Supraventricular tachycardia, Atrial fibrillation       Neuro Assessment: Exceptions to [facility] Neuro Checks:      Has TPA been given? No If patient is a Neuro Trauma and patient is  going to OR before floor call report to 4N Charge nurse: [phone] or [phone]   R Recommendations: See Admitting Provider Note  Report given to:   Additional Notes:

## 2022-10-01 NOTE — Subjective & Objective (Addendum)
CC: lying in bed for 2 days at home HPI: 57 year old male history of hepatic cirrhosis, history of hep C, history of status post TIPS, history of polysubstance abuse presents to the ER today via EMS.  Patient reports that he lives with a person named "[name]".  Does not know [name]'s last name.  EMS was called due to the patient laying in bed for 2 days.  Patient does not know why he is here.  Patient is a poor historian.  Encephalopathic.  Cannot give history.  Pt does states that he takes no medications on a regular basis. Does not see a PCP or specialist physician in several years.  On arrival he was febrile to one 1.8.  Heart rate was 173.  Blood pressure 124/86.  Initial lactic acid was 3.2. Sodium 135, potassium 3.1, chloride 100, bicarb 24, BUN 11, creatinine 0.8  Total protein 5.7, BUN 2.1, AST 49, ALT of 44, total bili 2.2  COVID-negative, influenza negative, RSV negative.  White count 7.9, hemoglobin 12.0, platelets of 92,000  Clean-catch UA showed amber-colored urine.  Large leukocyte's.  Rare bacteria.  Negative nitrites.  CT chest abdomen pelvis with IV contrast showed bilateral lower lobe atelectasis.  No pleural effusions.  Patient is status postcholecystectomy.  Status post TIPS.  No ascites.  He had some right inguinal adenopathy.  Ammonia level only 77.  Urine drug screen pending.  Initial EKG showed supraventricular tachycardia with heart rate of 186.  Patient started on IV Cardizem.  Heart rate slowed so that rapid A-fib could be seen.  [facility] hospitalist contacted for admission.

## 2022-10-02 DIAGNOSIS — I4891 Unspecified atrial fibrillation: Secondary | ICD-10-CM | POA: Diagnosis not present

## 2022-10-02 DIAGNOSIS — R7881 Bacteremia: Secondary | ICD-10-CM

## 2022-10-02 DIAGNOSIS — L03115 Cellulitis of right lower limb: Secondary | ICD-10-CM

## 2022-10-02 DIAGNOSIS — B955 Unspecified streptococcus as the cause of diseases classified elsewhere: Secondary | ICD-10-CM | POA: Diagnosis not present

## 2022-10-02 LAB — COMPREHENSIVE METABOLIC PANEL
ALT: 34 U/L (ref 0–44)
AST: 27 U/L (ref 15–41)
Albumin: 2.1 g/dL — ABNORMAL LOW (ref 3.5–5.0)
Alkaline Phosphatase: 77 U/L (ref 38–126)
Anion gap: 5 (ref 5–15)
BUN: 15 mg/dL (ref 6–20)
CO2: 24 mmol/L (ref 22–32)
Calcium: 8.5 mg/dL — ABNORMAL LOW (ref 8.9–10.3)
Chloride: 103 mmol/L (ref 98–111)
Creatinine, Ser: 0.72 mg/dL (ref 0.61–1.24)
GFR, Estimated: >60 mL/min (ref >60)
Glucose, Bld: 118 mg/dL — ABNORMAL HIGH (ref 70–99)
Potassium: 3.5 mmol/L (ref 3.5–5.1)
Sodium: 132 mmol/L — ABNORMAL LOW (ref 135–145)
Total Bilirubin: 1.3 mg/dL — ABNORMAL HIGH (ref 0.3–1.2)
Total Protein: 5.8 g/dL — ABNORMAL LOW (ref 6.5–8.1)

## 2022-10-02 LAB — MAGNESIUM: Magnesium: 1.9 mg/dL (ref 1.7–2.4)

## 2022-10-02 MED ORDER — MAGNESIUM SULFATE 2 GM/50ML IV SOLN
2.0000 g | Freq: Once | INTRAVENOUS | Status: AC
  Administered 2022-10-02: 2 g via INTRAVENOUS
  Filled 2022-10-02: qty 50

## 2022-10-02 MED ORDER — POTASSIUM CHLORIDE CRYS ER 20 MEQ PO TBCR
40.0000 meq | EXTENDED_RELEASE_TABLET | Freq: Once | ORAL | Status: AC
  Administered 2022-10-02: 40 meq via ORAL
  Filled 2022-10-02: qty 2

## 2022-10-02 NOTE — Evaluation (Signed)
Occupational Therapy Evaluation Patient Details Name: Jonathan Ibarra MRN: [id number] DOB: November 25, 1965 Today's Date: 10/02/2022   History of Present Illness Pt is a 57 y/o male presenting 8/1 to the ED via EMS due to AMS, pt lying in bed for 2 days.  PMH  Alcohol abuse, [name], HTN, liver cirrhosis, polysubstance abuse.   Clinical Impression   PTA, pt lived with roommate and reports he was mod I in his home. Pt reports roommate often home and can assist with medication management if needed at dc. Upon eval, pt performing UB ADL with set-up and LB ADL with min guard A. Pt with no awareness of need to stool, but then once initiated in bed, hyper aware and asking to get up to bathroom (slight impulsivity throughout). Min guard A for functional mobility. Min cues for safety awareness. HR up to 141 and pt slightly dizzy but rather than initiating break attempts to remain standing requiring cues. Pt oriented and with slight difficulty with delayed memory recall. Will continue to assess cognition acutely. Do not suspect need for follow up OT post-acutely.     Recommendations for follow up therapy are one component of a multi-disciplinary discharge planning process, led by the attending physician.  Recommendations may be updated based on patient status, additional functional criteria and insurance authorization.   Assistance Recommended at Discharge Intermittent Supervision/Assistance  Patient can return home with the following A little help with walking and/or transfers;[name];Assist for transportation;Help with stairs or ramp for entrance;Direct supervision/assist for financial management;Direct supervision/assist for medications management;Assistance with cooking/housework    Functional Status Assessment  Patient has had a recent decline in their functional status and demonstrates the ability to make significant improvements in function in a reasonable and  predictable amount of time.  Equipment Recommendations  BSC/3in1    Recommendations for Other Services       Precautions / Restrictions Precautions Precautions: Fall Restrictions Weight Bearing Restrictions: No      Mobility Bed Mobility Overal bed mobility: Needs Assistance Bed Mobility: Supine to Sit, Sit to Supine     Supine to sit: Min guard Sit to supine: Min guard   General bed mobility comments: effortful due to discomfort    Transfers Overall transfer level: Needs assistance   Transfers: Sit to/from Stand Sit to Stand: Min guard           General transfer comment: from lower bed and lower toilet levels      Balance Overall balance assessment: Mild deficits observed, not formally tested                                         ADL either performed or assessed with clinical judgement   ADL Overall ADL's : Needs assistance/impaired Eating/Feeding: Independent;Sitting   Grooming: Wash/dry hands;Min guard;Standing   Upper Body Bathing: Set up;Sitting   Lower Body Bathing: Min guard;Sit to/from stand   Upper Body Dressing : Set up;Sitting   Lower Body Dressing: Min guard;Sit to/from stand   Toilet Transfer: Min guard;Ambulation;Regular Toilet;Grab bars Toilet Transfer Details (indicate cue type and reason): very close gaurding required Toileting- Architect and Hygiene: Min guard;Sit to/from stand       Functional mobility during ADLs: Min guard       Vision Baseline Vision/History:  (reading glasses) Ability to See in Adequate Light: 0 Adequate Patient Visual Report: No change from  baseline Vision Assessment?: No apparent visual deficits Additional Comments: pt denies changes     Perception Perception Perception Tested?: No   Praxis Praxis Praxis tested?: Not tested    Pertinent Vitals/Pain Pain Assessment Pain Assessment: Faces Faces Pain Scale: Hurts little more Pain Location: upset stomach, bil  "ribs" Pain Descriptors / Indicators: Aching Pain Intervention(s): Limited activity within patient's tolerance, Monitored during session     Hand Dominance Left   Extremity/Trunk Assessment Upper Extremity Assessment Upper Extremity Assessment: Generalized weakness   Lower Extremity Assessment Lower Extremity Assessment: Generalized weakness   Cervical / Trunk Assessment Cervical / Trunk Assessment: Normal   Communication Communication Communication: No difficulties   Cognition Arousal/Alertness: Awake/alert Behavior During Therapy: WFL for tasks assessed/performed, Impulsive Overall Cognitive Status: Impaired/Different from baseline Area of Impairment: Memory, Safety/judgement, Awareness, Problem solving, Following commands                     Memory: Decreased recall of precautions, Decreased short-term memory (2/3 words delayed memory recall) Following Commands: Follows one step commands consistently, Follows one step commands with increased time, Follows multi-step commands inconsistently Safety/Judgement: Decreased awareness of safety, Decreased awareness of deficits (impulsive) Awareness: Emergent (unaware of need for BM until already initiated going) Problem Solving: Slow processing General Comments: slowed processing time throughout. Pt with diarrhea and  not feeling well throughout session. Oriented, following commands, slihtly impulsive. Cues for problem solving intermittently. Cognitive assessment affected by distraction from diarrhea. Otherwise, pleasant and conversational throughout.     General Comments  HR up to 141 after standing up from toilet and during attempts to wipe back side. Pt with one episode of dizziness, but with cueing safely able to walk back to EOB    Exercises     Shoulder Instructions      Home Living Family/patient expects to be discharged to:: Private residence Living Arrangements: Non-relatives/Friends Available Help at  Discharge: Other (Comment);Available PRN/intermittently (roommate) Type of Home: House Home Access: Level entry     Home Layout: One level     Bathroom Shower/Tub: Chief Strategy Officer: Standard     Home Equipment: None          Prior Functioning/Environment Prior Level of Function : Independent/Modified Independent             Mobility Comments: Independent in the home, can/doesn't drive, is  driven by others ADLs Comments: independent in the home.        OT Problem List: Decreased strength;Impaired balance (sitting and/or standing);Decreased activity tolerance;Decreased cognition;Decreased safety awareness;Decreased knowledge of use of DME or AE;Impaired UE functional use      OT Treatment/Interventions: Self-care/ADL training;Therapeutic exercise;DME and/or AE instruction;Balance training;Patient/family education;Therapeutic activities;Cognitive remediation/compensation    OT Goals(Current goals can be found in the care plan section) Acute Rehab OT Goals Patient Stated Goal: get better OT Goal Formulation: With patient Time For Goal Achievement: 10/16/22 Potential to Achieve Goals: Good  OT Frequency: Min 2X/week    Co-evaluation              AM-PAC OT "6 Clicks" Daily Activity     Outcome Measure Help from another person eating meals?: None Help from another person taking care of personal grooming?: A Little Help from another person toileting, which includes using toliet, bedpan, or urinal?: A Little Help from another person bathing (including washing, rinsing, drying)?: A Little Help from another person to put on and taking off regular upper body clothing?: A Little Help from another  person to put on and taking off regular lower body clothing?: A Little 6 Click Score: 19   End of Session Equipment Utilized During Treatment: Gait belt Nurse Communication: Mobility status;Other (comment) (HR up to 141, diarrhea)  Activity Tolerance:  Other (comment) (limited by BM/diarrhea) Patient left: in bed;with call bell/phone within reach;with bed alarm set;with nursing/sitter in room  OT Visit Diagnosis: Unsteadiness on feet (R26.81);Muscle weakness (generalized) (M62.81);Other symptoms and signs involving cognitive function                Time: 1610-9604 OT Time Calculation (min): 24 min Charges:  OT General Charges $OT Visit: 1 Visit OT Evaluation $OT Eval Low Complexity: 1 Low OT Treatments $Self Care/Home Management : 8-22 mins  Tyler Deis, OTR/L [facility] Acute Rehabilitation Office: [phone]  Myrla Halsted 10/02/2022, 10:23 AM

## 2022-10-02 NOTE — Consult Note (Signed)
[facility]                                                                                        Patient Identification: Patient Name: Jonathan Ibarra MRN: [id number] Admit Date: 09/30/2022 10:24 PM Today's Date: 10/02/2022 Reason for consult: strep bacteremia  Requesting provider: CHAMP autoconsult  Principal Problem:   Rapid atrial fibrillation (HCC) Active Problems:   Hepatic cirrhosis (HCC)   Polysubstance abuse (HCC)   Hypokalemia   S/P TIPS (transjugular intrahepatic portosystemic shunt)   SIRS without infection with organ dysfunction (HCC)   Current Antibiotics:  Penicillin G 8/2-c Linezolid 8/2-c Total days of antibiotics D 3  Lines/Hardware:  Assessment # Streptococcal bacteremia 2/2 -does not appear to be in toxic shock syndrome # Right thigh cellulitis, no signs of necrotizing fasciitis CT rt femur 8/3 Nonspecific soft tissue swelling of the right thigh. No abscess. Right inguinal lymphadenopathy may be reactive given presumed right thigh cellulitis. Lymphoproliferative disease is considered less likely  # Alcoholic liver cirrhosis s/p TIPS - no signs of peritonitis on exam  # Encephalopathy - seems to have improved  # H/o HCV   Recommendations  Continue penicillin G as is  Complete 72 hrs of linezolid for antitoxin effect Repeated 2 sets of blood cx today  No need for TTE as atypical organism to cause endocarditis and known source  HCV RNA, HBV serology ordered   Rest of the management as per the primary team. Please call with questions or concerns.  Thank you for the consult  __________________________________________________________________________________________________________ HPI and Hospital Course: 57 year old male with prior history of colic liver cirrhosis s/p TIPS, hypertension, polysubstance abuse, hepatitis C, seizure disorder, GERD brought to the ED  on 8/1 with fever, chills altered mental status, nausea vomiting and diarrhea  EMS was called due to patient laying in bed for 2 days  At ED febrile, tachycardic and A-fib with RVR, lethargic Labs remarkable for K3.3, creatinine 0.6, lactic acid 2.7, INR 1.5, AST 49, ALT 44, WBC 7.9 Imaging as below  ID consulted for Streptococcus pyogenes bacteremia  Was recently seen at [facility] 5/10 for leg wounds, culture with MRSA, group A streptococcus, Coagulase negative Staphylococcus.  Xray right leg and venous duplex of lower extremity was negative for acute abnormality. It appears he left AMA that time.  ROS: General- Denies loss of appetite and loss of weight HEENT - Denies headache, blurry vision, neck pain, sinus pain Chest - Denies any chest pain, SOB or cough CVS- Denies any dizziness/lightheadedness, syncopal attacks, palpitations Abdomen- Denies any nausea, vomiting, abdominal pain, hematochezia and diarrhea Neuro - Denies any weakness, numbness, tingling sensation GU- Denies any burning, dysuria, hematuria or increased frequency of urination Skin - chronic healed scars in legs  MSK - rt thigh pain   Past Medical History:  Diagnosis Date   Alcohol abuse    Cirrhosis of liver (HCC)    Hypertension    History reviewed. No pertinent surgical history.   Scheduled Meds:  lactulose  30 g Oral BID   metoprolol tartrate  25 mg Oral BID   Continuous Infusions:  diltiazem (  CARDIZEM) infusion Stopped (10/01/22 1254)   linezolid (ZYVOX) IV 600 mg (10/01/22 2214)   penicillin G potassium 12 Million Units in dextrose 5 % 500 mL CONTINUOUS infusion 12 Million Units (10/01/22 2331)   PRN Meds:.melatonin, ondansetron **OR** ondansetron (ZOFRAN) IV  Allergies  Allergen Reactions   Tylenol [Acetaminophen] Other (See Comments)    "Not good for liver"   Social History   Socioeconomic History   Marital status: Divorced    Spouse name: Not on file   Number of children: Not on file    Years of education: Not on file   Highest education level: Not on file  Occupational History   Not on file  Tobacco Use   Smoking status: Every Day    Current packs/day: 0.25    Types: Cigarettes   Smokeless tobacco: Never  Vaping Use   Vaping status: Every Day  Substance and Sexual Activity   Alcohol use: No   Drug use: Yes    Types: Cocaine, Marijuana   Sexual activity: Not on file  Other Topics Concern   Not on file  Social History Narrative   Not on file   Social Determinants of Health   Financial Resource Strain: Medium Risk (01/07/2020)   Received from [facility], [facility]   Overall Financial Resource Strain (CARDIA)    Difficulty of Paying Living Expenses: Somewhat hard  Food Insecurity: No Food Insecurity (10/01/2022)   Hunger Vital Sign    Worried About Running Out of Food in the Last Year: Never true    Ran Out of Food in the Last Year: Never true  Transportation Needs: No Transportation Needs (10/01/2022)   PRAPARE - Administrator, Civil Service (Medical): No    Lack of Transportation (Non-Medical): No  Physical Activity: Not on file  Stress: No Stress Concern Present (01/07/2020)   Received from [facility], [facility] Occupational Health - Occupational Stress Questionnaire    Feeling of Stress : Not at all  Social Connections: Unknown (07/05/2021)   Received from [facility], [facility]   Social Network    Social Network: Not on file  Intimate Partner Violence: Not At Risk (10/01/2022)   Humiliation, Afraid, Rape, and Kick questionnaire    Fear of Current or Ex-Partner: No    Emotionally Abused: No    Physically Abused: No    Sexually Abused: No   History reviewed. No pertinent family history.   Vitals BP 96/71 (BP Location: Left Arm)   Pulse 95   Temp 97.9 F (36.6 C) (Oral)   Resp 15   Ht 6\' 1"  (1.854 m)   Wt 135.6 kg   SpO2 99%   BMI 39.45 kg/m    Physical Exam Constitutional: Adult male  lying in the bed and appears    Comments: Poor hygiene  Cardiovascular:     Rate and Rhythm: Normal rate and regular rhythm.     Heart sounds: S1 and S2  Pulmonary:     Effort: Pulmonary effort is normal.     Comments: Normal breath sounds  Abdominal:     Palpations: Abdomen is soft.     Tenderness: Nondistended and nontender  Musculoskeletal:        General: No swelling or tenderness in peripheral joints or signs of septic joint.  He has erythema in the right lateral thigh with no crepitus or fluctuance.  Good range of motion of right hip and right knee  Skin:  Comments: No rashes  Neurological:     General: Awake, alert and oriented following commands  Psychiatric:        Mood and Affect: Mood normal.    Pertinent Microbiology Results for orders placed or performed during the hospital encounter of 09/30/22  Blood Culture (routine x 2)     Status: Abnormal (Preliminary result)   Collection Time: 09/30/22 10:46 PM   Specimen: BLOOD  Result Value Ref Range Status   Specimen Description BLOOD LEFT ANTECUBITAL  Final   Special Requests   Final    BOTTLES DRAWN AEROBIC AND ANAEROBIC Blood Culture adequate volume   Culture  Setup Time   Final    GRAM POSITIVE COCCI IN CHAINS AEROBIC BOTTLE ONLY CRITICAL RESULT CALLED TO, READ BACK BY AND VERIFIED WITH: PHARMD JESSICA MILLEN ON 10/01/22 @ 1532 BY DRT    Culture (A)  Final    STREPTOCOCCUS PYOGENES SUSCEPTIBILITIES TO FOLLOW HEALTH DEPARTMENT NOTIFIED Performed at [facility], [address], [place], [place] [address]    Report Status PENDING  Incomplete  Blood Culture ID Panel (Reflexed)     Status: Abnormal   Collection Time: 09/30/22 10:46 PM  Result Value Ref Range Status   Enterococcus faecalis NOT DETECTED NOT DETECTED Final   Enterococcus Faecium NOT DETECTED NOT DETECTED Final   Listeria monocytogenes NOT DETECTED NOT DETECTED Final   Staphylococcus species NOT DETECTED NOT DETECTED Final    Staphylococcus aureus (BCID) NOT DETECTED NOT DETECTED Final   Staphylococcus epidermidis NOT DETECTED NOT DETECTED Final   Staphylococcus lugdunensis NOT DETECTED NOT DETECTED Final   Streptococcus species DETECTED (A) NOT DETECTED Final    Comment: CRITICAL RESULT CALLED TO, READ BACK BY AND VERIFIED WITH: PHARMD JESSICA MILLEN ON 10/01/22 @ 1532 BY DRT    Streptococcus agalactiae NOT DETECTED NOT DETECTED Final   Streptococcus pneumoniae NOT DETECTED NOT DETECTED Final   Streptococcus pyogenes DETECTED (A) NOT DETECTED Final    Comment: CRITICAL RESULT CALLED TO, READ BACK BY AND VERIFIED WITH: PHARMD JESSICA MILLEN ON 10/01/22 @ 1532 BY DRT    A.calcoaceticus-baumannii NOT DETECTED NOT DETECTED Final   Bacteroides fragilis NOT DETECTED NOT DETECTED Final   Enterobacterales NOT DETECTED NOT DETECTED Final   Enterobacter cloacae complex NOT DETECTED NOT DETECTED Final   Escherichia coli NOT DETECTED NOT DETECTED Final   Klebsiella aerogenes NOT DETECTED NOT DETECTED Final   Klebsiella oxytoca NOT DETECTED NOT DETECTED Final   Klebsiella pneumoniae NOT DETECTED NOT DETECTED Final   Proteus species NOT DETECTED NOT DETECTED Final   Salmonella species NOT DETECTED NOT DETECTED Final   Serratia marcescens NOT DETECTED NOT DETECTED Final   Haemophilus influenzae NOT DETECTED NOT DETECTED Final   Neisseria meningitidis NOT DETECTED NOT DETECTED Final   Pseudomonas aeruginosa NOT DETECTED NOT DETECTED Final   Stenotrophomonas maltophilia NOT DETECTED NOT DETECTED Final   Candida albicans NOT DETECTED NOT DETECTED Final   Candida auris NOT DETECTED NOT DETECTED Final   Candida glabrata NOT DETECTED NOT DETECTED Final   Candida krusei NOT DETECTED NOT DETECTED Final   Candida parapsilosis NOT DETECTED NOT DETECTED Final   Candida tropicalis NOT DETECTED NOT DETECTED Final   Cryptococcus neoformans/gattii NOT DETECTED NOT DETECTED Final    Comment: Performed at [facility], [address], [place], [place] [address]  Blood Culture (routine x 2)     Status: None (Preliminary result)   Collection Time: 09/30/22 10:53 PM   Specimen: BLOOD  Result  Value Ref Range Status   Specimen Description BLOOD RIGHT ANTECUBITAL  Final   Special Requests   Final    BOTTLES DRAWN AEROBIC AND ANAEROBIC Blood Culture adequate volume   Culture   Final    NO GROWTH 2 DAYS Performed at [facility], [address], [place], [place] [address]    Report Status PENDING  Incomplete  Resp panel by RT-PCR (RSV, Flu A&B, Covid) Anterior Nasal Swab     Status: None   Collection Time: 09/30/22 10:56 PM   Specimen: Anterior Nasal Swab  Result Value Ref Range Status   SARS Coronavirus 2 by RT PCR NEGATIVE NEGATIVE Final   Influenza A by PCR NEGATIVE NEGATIVE Final   Influenza B by PCR NEGATIVE NEGATIVE Final    Comment: (NOTE) The Xpert Xpress SARS-CoV-2/FLU/RSV plus assay is intended as an aid in the diagnosis of influenza from Nasopharyngeal swab specimens and should not be used as a sole basis for treatment. Nasal washings and aspirates are unacceptable for Xpert Xpress SARS-CoV-2/FLU/RSV testing.  Fact Sheet for Patients: BloggerCourse.com  Fact Sheet for Healthcare Providers: SeriousBroker.it  This test is not yet approved or cleared by the [place] FDA and has been authorized for detection and/or diagnosis of SARS-CoV-2 by FDA under an Emergency Use Authorization (EUA). This EUA will remain in effect (meaning this test can be used) for the duration of the COVID-19 declaration under Section 564(b)(1) of the Act, 21 U.S.C. section 360bbb-3(b)(1), unless the authorization is terminated or revoked.     Resp Syncytial Virus by PCR NEGATIVE NEGATIVE Final    Comment: (NOTE) Fact Sheet for Patients: BloggerCourse.com  Fact Sheet for Healthcare Providers: SeriousBroker.it  This  test is not yet approved or cleared by the [place] FDA and has been authorized for detection and/or diagnosis of SARS-CoV-2 by FDA under an Emergency Use Authorization (EUA). This EUA will remain in effect (meaning this test can be used) for the duration of the COVID-19 declaration under Section 564(b)(1) of the Act, 21 U.S.C. section 360bbb-3(b)(1), unless the authorization is terminated or revoked.  Performed at [facility], [address] [place], [place] [address]     Pertinent Lab seen by me:    Latest Ref Rng & Units 10/01/2022    1:12 AM 09/30/2022   11:33 PM 09/30/2022   11:15 PM  CBC  WBC 4.0 - 10.5 K/uL 6.5  7.9    Hemoglobin 13.0 - 17.0 g/dL 01.0  27.2  53.6   Hematocrit 39.0 - 52.0 % 37.2  35.6  35.0   Platelets 150 - 400 K/uL 79  92        Latest Ref Rng & Units 10/02/2022    2:31 AM 10/01/2022    1:12 AM 09/30/2022   11:15 PM  CMP  Glucose 70 - 99 mg/dL 644  034  742   BUN 6 - 20 mg/dL 15  11  13    Creatinine 0.61 - 1.24 mg/dL 5.95  6.38  7.56   Sodium 135 - 145 mmol/L 132  133  135   Potassium 3.5 - 5.1 mmol/L 3.5  3.0  3.3   Chloride 98 - 111 mmol/L 103  101  99   CO2 22 - 32 mmol/L 24  22    Calcium 8.9 - 10.3 mg/dL 8.5  7.8    Total Protein 6.5 - 8.1 g/dL 5.8  5.1    Total Bilirubin 0.3 - 1.2 mg/dL 1.3  1.9  Alkaline Phos 38 - 126 U/L 77  66    AST 15 - 41 U/L 27  41    ALT 0 - 44 U/L 34  40       Pertinent Imagings/Other Imagings Plain films and CT images have been personally visualized and interpreted; radiology reports have been reviewed. Decision making incorporated into the Impression / Recommendations.  CT FEMUR RIGHT W CONTRAST  Result Date: 10/02/2022 CLINICAL DATA:  Right thigh soft tissue infection. EXAM: CT OF THE LOWER RIGHT EXTREMITY WITH CONTRAST TECHNIQUE: Multidetector CT imaging of the lower right extremity was performed according to the standard protocol following intravenous contrast administration. RADIATION DOSE REDUCTION:  This exam was performed according to the departmental dose-optimization program which includes automated exposure control, adjustment of the mA and/or kV according to patient size and/or use of iterative reconstruction technique. CONTRAST:  OMNIPAQUE IOHEXOL 350 MG/ML SOLN COMPARISON:  None Available. FINDINGS: Bones/Joint/Cartilage No fracture or dislocation. Mild degenerative changes of the right hip and knee. No significant joint effusion. Ligaments Ligaments are suboptimally evaluated by CT. Muscles and Tendons Grossly intact. Soft tissue Multiple enlarged right inguinal lymph nodes measuring up to 2.1 cm in short axis. Nonspecific soft tissue swelling of the right thigh. No fluid collection or subcutaneous emphysema. No soft tissue mass. IMPRESSION: 1. Nonspecific soft tissue swelling of the right thigh. No abscess. 2. Right inguinal lymphadenopathy may be reactive given presumed right thigh cellulitis. Lymphoproliferative disease is considered less likely given lack of pathologic lymphadenopathy in the chest, abdomen, or pelvis on yesterday's CT. 3.  No acute osseous abnormality. Electronically Signed   By: Obie Dredge M.D.   On: 10/02/2022 08:11   DG Chest 2 View  Result Date: 10/01/2022 CLINICAL DATA:  Sepsis, fever and chills EXAM: CHEST - 2 VIEW COMPARISON:  The 124 FINDINGS: Frontal and lateral views of the chest demonstrate a stable cardiac silhouette. Increased central vascular congestion, without focal consolidation. Trace bilateral effusions. No pneumothorax. IMPRESSION: 1. Central vascular congestion without pulmonary edema. 2. Trace bilateral pleural effusions. Electronically Signed   By: Sharlet Salina M.D.   On: 10/01/2022 17:53   ECHOCARDIOGRAM COMPLETE  Result Date: 10/01/2022    ECHOCARDIOGRAM REPORT   Patient Name:   RYLEN SWINDLER Date of Exam: 10/01/2022 Medical Rec #:  [id number]          Height:       73.0 in Accession #:    [id number]         Weight:       300.3 lb Date  of Birth:  [birth year]         BSA:          2.557 m Patient Age:    56 years           BP:           113/74 mmHg Patient Gender: M                  HR:           84 bpm. Exam Location:  Inpatient Procedure: 2D Echo, Intracardiac Opacification Agent, Color Doppler and Cardiac            Doppler Indications:    Atrial Fibrillation  History:        Patient has no prior history of Echocardiogram examinations.                 Cirrhosis, Arrythmias:Atrial Fibrillation; Risk  Factors:Poly-substance abuse.  Sonographer:    Delcie Roch RDCS Referring Phys: [phone] ERIC CHEN IMPRESSIONS  1. Left ventricular ejection fraction, by estimation, is 55 to 60%. The left ventricle has normal function. The left ventricle has no regional wall motion abnormalities. Left ventricular diastolic parameters are indeterminate.  2. Right ventricular systolic function is normal. The right ventricular size is normal. Tricuspid regurgitation signal is inadequate for assessing PA pressure.  3. Left atrial size was mildly dilated.  4. Right atrial size was mildly dilated.  5. The mitral valve is normal in structure. No evidence of mitral valve regurgitation. No evidence of mitral stenosis.  6. The aortic valve is tricuspid. There is mild calcification of the aortic valve. Aortic valve regurgitation is not visualized. No aortic stenosis is present.  7. The inferior vena cava is dilated in size with >50% respiratory variability, suggesting right atrial pressure of 8 mmHg.  8. The patient was in atrial fibrillation. FINDINGS  Left Ventricle: Left ventricular ejection fraction, by estimation, is 55 to 60%. The left ventricle has normal function. The left ventricle has no regional wall motion abnormalities. Definity contrast agent was given IV to delineate the left ventricular  endocardial borders. The left ventricular internal cavity size was normal in size. There is no left ventricular hypertrophy. Left ventricular diastolic  parameters are indeterminate. Right Ventricle: The right ventricular size is normal. No increase in right ventricular wall thickness. Right ventricular systolic function is normal. Tricuspid regurgitation signal is inadequate for assessing PA pressure. Left Atrium: Left atrial size was mildly dilated. Right Atrium: Right atrial size was mildly dilated. Pericardium: There is no evidence of pericardial effusion. Mitral Valve: The mitral valve is normal in structure. No evidence of mitral valve regurgitation. No evidence of mitral valve stenosis. Tricuspid Valve: The tricuspid valve is normal in structure. Tricuspid valve regurgitation is trivial. Aortic Valve: The aortic valve is tricuspid. There is mild calcification of the aortic valve. Aortic valve regurgitation is not visualized. No aortic stenosis is present. Pulmonic Valve: The pulmonic valve was normal in structure. Pulmonic valve regurgitation is not visualized. Aorta: The aortic root is normal in size and structure. Venous: The inferior vena cava is dilated in size with greater than 50% respiratory variability, suggesting right atrial pressure of 8 mmHg. IAS/Shunts: No atrial level shunt detected by color flow Doppler.  LEFT VENTRICLE PLAX 2D LVIDd:         5.50 cm LVIDs:         3.60 cm LV PW:         1.10 cm LV IVS:        1.10 cm LVOT diam:     2.30 cm LVOT Area:     4.15 cm  RIGHT VENTRICLE          IVC RV Basal diam:  3.50 cm  IVC diam: 2.50 cm LEFT ATRIUM             Index        RIGHT ATRIUM           Index LA diam:        4.10 cm 1.60 cm/m   RA Area:     20.00 cm LA Vol (A2C):   84.9 ml 33.20 ml/m  RA Volume:   59.60 ml  23.31 ml/m LA Vol (A4C):   80.1 ml 31.32 ml/m LA Biplane Vol: 82.4 ml 32.22 ml/m   AORTA Ao Root diam: 3.20 cm Ao Asc diam:  3.20 cm  SHUNTS  Systemic Diam: 2.30 cm Dalton McleanMD Electronically signed by Wilfred Lacy Signature Date/Time: 10/01/2022/5:03:21 PM    Final    CT CHEST ABDOMEN PELVIS W CONTRAST  Result Date:  10/01/2022 CLINICAL DATA:  Sepsis hx ov cirrhosis with TIPS EXAM: CT CHEST, ABDOMEN, AND PELVIS WITH CONTRAST TECHNIQUE: Multidetector CT imaging of the chest, abdomen and pelvis was performed following the standard protocol during bolus administration of intravenous contrast. RADIATION DOSE REDUCTION: This exam was performed according to the departmental dose-optimization program which includes automated exposure control, adjustment of the mA and/or kV according to patient size and/or use of iterative reconstruction technique. CONTRAST:  75mL OMNIPAQUE IOHEXOL 350 MG/ML SOLN COMPARISON:  CT abdomen pelvis 01/25/2015 FINDINGS: CT CHEST FINDINGS Cardiovascular: Normal heart size. No significant pericardial effusion. The thoracic aorta is normal in caliber. No atherosclerotic plaque of the thoracic aorta. No coronary artery calcifications. The main pulmonary artery is normal in caliber. No central pulmonary embolus. Mediastinum/Nodes: No enlarged mediastinal, hilar, or axillary lymph nodes. Thyroid gland, trachea, and esophagus demonstrate no significant findings. Small hiatal hernia. Lungs/Pleura: Limited evaluation due to motion artifact. Bilateral lower lobe passive atelectasis. No focal consolidation. No pulmonary nodule. No pulmonary mass. Trace bilateral pleural effusions, right greater than left. No pneumothorax. Musculoskeletal: Bilateral gynecomastia. No suspicious lytic or blastic osseous lesions. Likely subacute right anterior sixth rib fracture (3:47). Chronic midthoracic vertebral body vertebral body height loss with associated exaggerated kyphosis. CT ABDOMEN PELVIS FINDINGS Hepatobiliary: Tips procedure noted. No focal liver abnormality. Status post cholecystectomy. No biliary dilatation. Pancreas: No focal lesion. Normal pancreatic contour. No surrounding inflammatory changes. No main pancreatic ductal dilatation. Spleen: Normal in size without focal abnormality. Adrenals/Urinary Tract: No adrenal  nodule bilaterally. Bilateral kidneys enhance symmetrically. Punctate left nephrolithiasis No hydronephrosis. No hydroureter. The urinary bladder is unremarkable. Stomach/Bowel: Limited evaluation due to motion artifact stomach is within normal limits. No evidence of bowel wall thickening or dilatation. Appendix appears normal. Vascular/Lymphatic: Slightly increased trace periaortic fat stranding and prominent but nonenlarged lymph nodes (3:81-91) no abdominal aorta or iliac aneurysm. Mild atherosclerotic plaque of the aorta and its branches. Right inguinal lymphadenopathy with lymph node measuring up to 1.9 cm. Associated subcutaneus soft tissue edema. No abdominalor pelvic lymphadenopathy. Reproductive: Prostate is unremarkable. Scrotum collimated off view. Other: No intraperitoneal free fluid. No intraperitoneal free gas. No organized fluid collection. Musculoskeletal: Small fat containing umbilical hernia. No suspicious lytic or blastic osseous lesions. No acute displaced fracture. Multilevel degenerative changes of the spine. IMPRESSION: 1.  Trace bilateral pleural effusions, right greater than left. 2. Right inguinal lymphadenopathy. Please note scrotum collimated off view. Recommend correlation with physical exam. 3. Slightly increased trace periaortic fat stranding and prominent but nonenlarged lymph nodes. Question developing retroperitoneal fibrosis. Recommend attention on follow-up. 4. Nonobstructive punctate left nephrolithiasis. 5. Likely subacute right anterior sixth rib fracture. Correlate with point tenderness to palpation to evaluate for an acute component. 6. TIPS procedure. Electronically Signed   By: Tish Frederickson M.D.   On: 10/01/2022 02:00   DG Chest Port 1 View  Result Date: 09/30/2022 CLINICAL DATA:  Possible sepsis fever EXAM: PORTABLE CHEST 1 VIEW COMPARISON:  02/04/2015 FINDINGS: Cardiomegaly with mild central congestion. No consolidation, pleural effusion or pneumothorax. Low lung  volumes IMPRESSION: Cardiomegaly with mild central congestion. Electronically Signed   By: Jasmine Pang M.D.   On: 09/30/2022 23:18    I have personally spent 82 minutes involved in face-to-face and non-face-to-face activities for this patient on the day of the visit. Professional time  spent includes the following activities: Preparing to see the patient (review of tests), Obtaining and/or reviewing separately obtained history (admission/discharge record), Performing a medically appropriate examination and/or evaluation , Ordering medications/tests/procedures, referring and communicating with other health care professionals, Documenting clinical information in the EMR, Independently interpreting results (not separately reported), Communicating results to the patient/family/caregiver, Counseling and educating the patient/family/caregiver and Care coordination (not separately reported).  Electronically signed by:   Plan d/w requesting provider as well as ID pharm D  Note: This document was prepared using dragon voice recognition software and may include unintentional dictation errors.   Odette Fraction, MD Infectious Disease Physician [facility] Pager: [phone]

## 2022-10-02 NOTE — Progress Notes (Addendum)
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 -In the ED he was febrile to 102, tachycardic with SVT, lactate of 3.2, sodium 135, creatinine 0.8, WBC 7.9, hemoglobin 12, platelet 92 K, UA with amber-colored urine, large leukocytes, negative nitrite with rare bacteria, ammonia level 77, CT abdomen pelvis with IV contrast noted bilateral lower lobe atelectasis  Subjective: -Feels okay, more awake today, denies any localizing symptoms  Assessment and Plan:  Strep pyogenes bacteremia Right thigh cellulitis -Blood cultures with strep bacteremia 2/2 -Had mild erythema of right thigh, follow-up CT femur with nonspecific soft tissue swelling, no abscess, inguinal adenopathy -ID consult appreciated, now on penicillin G and 48 hours of linezolid for antitoxin effect -Repeat blood cultures  SVT -Does not appear to be in A-fib -Weaned off Cardizem gtt., continue low-dose metoprolol  Decompensated liver cirrhosis -Hold off on diuretics at this time, resume in 1 to 2 days -History of TIPS  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, poorly compliant with diuretics and lactulose -Continue lactulose -Ambulate, PT OT eval  Chronic thrombocytopenia -Secondary to liver disease  Hypokalemia Replete with IV kcl  Polysubstance abuse (HCC) -UDS positive for cannabis and amphetamines, counseled  S DOH, poor compliance with medications, follow-up, polysubstance abuse -TOC consult  DVT prophylaxis: SCDs  Code Status: Full code Family Communication: None present Disposition Plan:   Consultants:    Procedures:   Antimicrobials:     Objective: Vitals:   10/02/22 0154 10/02/22 0246 10/02/22 0745 10/02/22 1146  BP: 110/76 114/75 96/71 124/74  Pulse: 95 82 95 98  Resp: 18 20 15 18   Temp: 97.7 F (36.5 C)  97.9 F (36.6 C) 98.2 F (36.8 C)  TempSrc: Oral Oral Oral Oral  SpO2: 97%  99% 99%  Weight:  135.6 kg    Height:        Intake/Output Summary (Last 24 hours) at 10/02/2022 1157 Last data filed at 10/02/2022 1018 Gross per 24 hour  Intake 1249.67 ml  Output 300 ml  Net 949.67 ml   Filed Weights   10/01/22 1000 10/02/22 0246  Weight: (!) 136.2 kg 135.6 kg    Examination:  General exam: Appears calm and comfortable  Respiratory system: Clear to auscultation Cardiovascular system: S1 & S2 heard, RRR.  Abd: nondistended, soft and nontender.Normal bowel sounds heard. Central nervous system: Alert and oriented. No focal neurological deficits. Extremities: no edema, mild thigh erythema Skin: Mild erythema over upper thigh Psychiatry:  Mood & affect appropriate.     Data Reviewed:   CBC: Recent Labs  Lab 09/30/22 2254 09/30/22 2314 09/30/22 2315 09/30/22 2333 10/01/22 0112  WBC  --   --   --  7.9 6.5  NEUTROABS  --   --   --  7.3 5.8  HGB 11.9* 12.6* 11.9* 12.0* 12.4*  HCT 35.0* 37.0* 35.0* 35.6* 37.2*  MCV  --   --   --  91.8 92.1  PLT  --   --   --  92* 79*   Basic Metabolic Panel: Recent Labs  Lab 09/30/22 2253 09/30/22 2254 09/30/22 2314 09/30/22 2315 10/01/22 0112 10/02/22 0231  NA 135 135 135 135 133* 132*  K 3.1*  3.3* 3.3* 3.3* 3.0* 3.5  CL 100 99  --  99 101 103  CO2 24  --   --   --  22 24  GLUCOSE 121* 118*  --  114* 115* 118*  BUN 11 12  --  13 11 15   CREATININE 0.84 0.60*  --  0.60* 0.79 0.72  CALCIUM 8.3*  --   --   --  7.8* 8.5*  MG  --   --   --   --  1.2* 1.9   GFR: Estimated Creatinine Clearance: 150.8 mL/min (by C-G formula based on SCr of 0.72 mg/dL). Liver Function Tests: Recent Labs  Lab 09/30/22 2253 10/01/22 0112 10/02/22 0231  AST 49* 41 27   ALT 44 40 34  ALKPHOS 72 66 77  BILITOT 2.2* 1.9* 1.3*  PROT 5.7* 5.1* 5.8*  ALBUMIN 2.1* 2.0* 2.1*   No results for input(s): "LIPASE", "AMYLASE" in the last 168 hours. Recent Labs  Lab 10/01/22 0115  AMMONIA 77*   Coagulation Profile: Recent Labs  Lab 09/30/22 2253  INR 1.5*   Cardiac Enzymes: No results for input(s): "CKTOTAL", "CKMB", "CKMBINDEX", "TROPONINI" in the last 168 hours. BNP (last 3 results) No results for input(s): "PROBNP" in the last 8760 hours. HbA1C: No results for input(s): "HGBA1C" in the last 72 hours. CBG: No results for input(s): "GLUCAP" in the last 168 hours. Lipid Profile: No results for input(s): "CHOL", "HDL", "LDLCALC", "TRIG", "CHOLHDL", "LDLDIRECT" in the last 72 hours. Thyroid Function Tests: Recent Labs    10/01/22 0112  TSH 1.222   Anemia Panel: No results for input(s): "VITAMINB12", "FOLATE", "FERRITIN", "TIBC", "IRON", "RETICCTPCT" in the last 72 hours. Urine analysis:    Component Value Date/Time   COLORURINE AMBER (A) 10/01/2022 0142   APPEARANCEUR CLOUDY (A) 10/01/2022 0142   LABSPEC 1.025 10/01/2022 0142   PHURINE 5.0 10/01/2022 0142   GLUCOSEU NEGATIVE 10/01/2022 0142   HGBUR SMALL (A) 10/01/2022 0142   BILIRUBINUR NEGATIVE 10/01/2022 0142   KETONESUR NEGATIVE 10/01/2022 0142   PROTEINUR 30 (A) 10/01/2022 0142   NITRITE NEGATIVE 10/01/2022 0142   LEUKOCYTESUR LARGE (A) 10/01/2022 0142   Sepsis Labs: @LABRCNTIP (procalcitonin:4,lacticidven:4)  ) Recent Results (from the past 240 hour(s))  Blood Culture (routine x 2)     Status: Abnormal (Preliminary result)   Collection Time: 09/30/22 10:46 PM   Specimen: BLOOD  Result Value Ref Range Status   Specimen Description BLOOD LEFT ANTECUBITAL  Final   Special Requests   Final    BOTTLES DRAWN AEROBIC AND ANAEROBIC Blood Culture adequate volume   Culture  Setup Time   Final    GRAM POSITIVE COCCI IN CHAINS AEROBIC BOTTLE ONLY CRITICAL RESULT CALLED TO, READ BACK BY  AND VERIFIED WITH: PHARMD JESSICA MILLEN ON 10/01/22 @ 1532 BY DRT    Culture (A)  Final    STREPTOCOCCUS PYOGENES SUSCEPTIBILITIES TO FOLLOW HEALTH DEPARTMENT NOTIFIED Performed at [facility], [address] [place], [place] [address]    Report Status PENDING  Incomplete  Blood Culture ID Panel (Reflexed)     Status: Abnormal   Collection Time: 09/30/22 10:46 PM  Result Value Ref Range Status   Enterococcus faecalis NOT DETECTED NOT DETECTED Final   Enterococcus Faecium NOT DETECTED NOT DETECTED Final   Listeria monocytogenes NOT DETECTED NOT DETECTED Final   Staphylococcus species NOT DETECTED NOT DETECTED Final   Staphylococcus aureus (BCID) NOT DETECTED NOT DETECTED Final   Staphylococcus epidermidis NOT DETECTED NOT DETECTED Final  Staphylococcus lugdunensis NOT DETECTED NOT DETECTED Final   Streptococcus species DETECTED (A) NOT DETECTED Final    Comment: CRITICAL RESULT CALLED TO, READ BACK BY AND VERIFIED WITH: PHARMD JESSICA MILLEN ON 10/01/22 @ 1532 BY DRT    Streptococcus agalactiae NOT DETECTED NOT DETECTED Final   Streptococcus pneumoniae NOT DETECTED NOT DETECTED Final   Streptococcus pyogenes DETECTED (A) NOT DETECTED Final    Comment: CRITICAL RESULT CALLED TO, READ BACK BY AND VERIFIED WITH: PHARMD JESSICA MILLEN ON 10/01/22 @ 1532 BY DRT    A.calcoaceticus-baumannii NOT DETECTED NOT DETECTED Final   Bacteroides fragilis NOT DETECTED NOT DETECTED Final   Enterobacterales NOT DETECTED NOT DETECTED Final   Enterobacter cloacae complex NOT DETECTED NOT DETECTED Final   Escherichia coli NOT DETECTED NOT DETECTED Final   Klebsiella aerogenes NOT DETECTED NOT DETECTED Final   Klebsiella oxytoca NOT DETECTED NOT DETECTED Final   Klebsiella pneumoniae NOT DETECTED NOT DETECTED Final   Proteus species NOT DETECTED NOT DETECTED Final   Salmonella species NOT DETECTED NOT DETECTED Final   Serratia marcescens NOT DETECTED NOT DETECTED Final   Haemophilus influenzae  NOT DETECTED NOT DETECTED Final   Neisseria meningitidis NOT DETECTED NOT DETECTED Final   Pseudomonas aeruginosa NOT DETECTED NOT DETECTED Final   Stenotrophomonas maltophilia NOT DETECTED NOT DETECTED Final   Candida albicans NOT DETECTED NOT DETECTED Final   Candida auris NOT DETECTED NOT DETECTED Final   Candida glabrata NOT DETECTED NOT DETECTED Final   Candida krusei NOT DETECTED NOT DETECTED Final   Candida parapsilosis NOT DETECTED NOT DETECTED Final   Candida tropicalis NOT DETECTED NOT DETECTED Final   Cryptococcus neoformans/gattii NOT DETECTED NOT DETECTED Final    Comment: Performed at [facility], [address] [place], [place] [address]  Blood Culture (routine x 2)     Status: None (Preliminary result)   Collection Time: 09/30/22 10:53 PM   Specimen: BLOOD  Result Value Ref Range Status   Specimen Description BLOOD RIGHT ANTECUBITAL  Final   Special Requests   Final    BOTTLES DRAWN AEROBIC AND ANAEROBIC Blood Culture adequate volume   Culture   Final    NO GROWTH 2 DAYS Performed at [facility], [address], [place], [place] [address]    Report Status PENDING  Incomplete  Resp panel by RT-PCR (RSV, Flu A&B, Covid) Anterior Nasal Swab     Status: None   Collection Time: 09/30/22 10:56 PM   Specimen: Anterior Nasal Swab  Result Value Ref Range Status   SARS Coronavirus 2 by RT PCR NEGATIVE NEGATIVE Final   Influenza A by PCR NEGATIVE NEGATIVE Final   Influenza B by PCR NEGATIVE NEGATIVE Final    Comment: (NOTE) The Xpert Xpress SARS-CoV-2/FLU/RSV plus assay is intended as an aid in the diagnosis of influenza from Nasopharyngeal swab specimens and should not be used as a sole basis for treatment. Nasal washings and aspirates are unacceptable for Xpert Xpress SARS-CoV-2/FLU/RSV testing.  Fact Sheet for Patients: BloggerCourse.com  Fact Sheet for Healthcare Providers: SeriousBroker.it  This test  is not yet approved or cleared by the [place] FDA and has been authorized for detection and/or diagnosis of SARS-CoV-2 by FDA under an Emergency Use Authorization (EUA). This EUA will remain in effect (meaning this test can be used) for the duration of the COVID-19 declaration under Section 564(b)(1) of the Act, 21 U.S.C. section 360bbb-3(b)(1), unless the authorization is terminated or revoked.     Resp Syncytial  Virus by PCR NEGATIVE NEGATIVE Final    Comment: (NOTE) Fact Sheet for Patients: BloggerCourse.com  Fact Sheet for Healthcare Providers: SeriousBroker.it  This test is not yet approved or cleared by the [place] FDA and has been authorized for detection and/or diagnosis of SARS-CoV-2 by FDA under an Emergency Use Authorization (EUA). This EUA will remain in effect (meaning this test can be used) for the duration of the COVID-19 declaration under Section 564(b)(1) of the Act, 21 U.S.C. section 360bbb-3(b)(1), unless the authorization is terminated or revoked.  Performed at [facility], [address] [place], [place] [address]   Urine Culture     Status: None   Collection Time: 10/01/22  1:42 AM   Specimen: Urine, Random  Result Value Ref Range Status   Specimen Description URINE, RANDOM  Final   Special Requests NONE Reflexed from [id number]  Final   Culture   Final    NO GROWTH Performed at [facility], [address], [place], [place] [address]    Report Status 10/02/2022 FINAL  Final     Radiology Studies: CT FEMUR RIGHT W CONTRAST  Result Date: 10/02/2022 CLINICAL DATA:  Right thigh soft tissue infection. EXAM: CT OF THE LOWER RIGHT EXTREMITY WITH CONTRAST TECHNIQUE: Multidetector CT imaging of the lower right extremity was performed according to the standard protocol following intravenous contrast administration. RADIATION DOSE REDUCTION: This exam was performed according to the  departmental dose-optimization program which includes automated exposure control, adjustment of the mA and/or kV according to patient size and/or use of iterative reconstruction technique. CONTRAST:  OMNIPAQUE IOHEXOL 350 MG/ML SOLN COMPARISON:  None Available. FINDINGS: Bones/Joint/Cartilage No fracture or dislocation. Mild degenerative changes of the right hip and knee. No significant joint effusion. Ligaments Ligaments are suboptimally evaluated by CT. Muscles and Tendons Grossly intact. Soft tissue Multiple enlarged right inguinal lymph nodes measuring up to 2.1 cm in short axis. Nonspecific soft tissue swelling of the right thigh. No fluid collection or subcutaneous emphysema. No soft tissue mass. IMPRESSION: 1. Nonspecific soft tissue swelling of the right thigh. No abscess. 2. Right inguinal lymphadenopathy may be reactive given presumed right thigh cellulitis. Lymphoproliferative disease is considered less likely given lack of pathologic lymphadenopathy in the chest, abdomen, or pelvis on yesterday's CT. 3.  No acute osseous abnormality. Electronically Signed   By: Obie Dredge M.D.   On: 10/02/2022 08:11   DG Chest 2 View  Result Date: 10/01/2022 CLINICAL DATA:  Sepsis, fever and chills EXAM: CHEST - 2 VIEW COMPARISON:  The 124 FINDINGS: Frontal and lateral views of the chest demonstrate a stable cardiac silhouette. Increased central vascular congestion, without focal consolidation. Trace bilateral effusions. No pneumothorax. IMPRESSION: 1. Central vascular congestion without pulmonary edema. 2. Trace bilateral pleural effusions. Electronically Signed   By: Sharlet Salina M.D.   On: 10/01/2022 17:53   ECHOCARDIOGRAM COMPLETE  Result Date: 10/01/2022    ECHOCARDIOGRAM REPORT   Patient Name:   Jonathan Ibarra Date of Exam: 10/01/2022 Medical Rec #:  [id number]          Height:       73.0 in Accession #:    [id number]         Weight:       300.3 lb Date of Birth:  Aug 24, 1965         BSA:           2.557 m Patient Age:    41 years  BP:           113/74 mmHg Patient Gender: M                  HR:           84 bpm. Exam Location:  Inpatient Procedure: 2D Echo, Intracardiac Opacification Agent, Color Doppler and Cardiac            Doppler Indications:    Atrial Fibrillation  History:        Patient has no prior history of Echocardiogram examinations.                 Cirrhosis, Arrythmias:Atrial Fibrillation; Risk                 Factors:Poly-substance abuse.  Sonographer:    Delcie Roch RDCS Referring Phys: [phone] ERIC CHEN IMPRESSIONS  1. Left ventricular ejection fraction, by estimation, is 55 to 60%. The left ventricle has normal function. The left ventricle has no regional wall motion abnormalities. Left ventricular diastolic parameters are indeterminate.  2. Right ventricular systolic function is normal. The right ventricular size is normal. Tricuspid regurgitation signal is inadequate for assessing PA pressure.  3. Left atrial size was mildly dilated.  4. Right atrial size was mildly dilated.  5. The mitral valve is normal in structure. No evidence of mitral valve regurgitation. No evidence of mitral stenosis.  6. The aortic valve is tricuspid. There is mild calcification of the aortic valve. Aortic valve regurgitation is not visualized. No aortic stenosis is present.  7. The inferior vena cava is dilated in size with >50% respiratory variability, suggesting right atrial pressure of 8 mmHg.  8. The patient was in atrial fibrillation. FINDINGS  Left Ventricle: Left ventricular ejection fraction, by estimation, is 55 to 60%. The left ventricle has normal function. The left ventricle has no regional wall motion abnormalities. Definity contrast agent was given IV to delineate the left ventricular  endocardial borders. The left ventricular internal cavity size was normal in size. There is no left ventricular hypertrophy. Left ventricular diastolic parameters are indeterminate. Right Ventricle: The  right ventricular size is normal. No increase in right ventricular wall thickness. Right ventricular systolic function is normal. Tricuspid regurgitation signal is inadequate for assessing PA pressure. Left Atrium: Left atrial size was mildly dilated. Right Atrium: Right atrial size was mildly dilated. Pericardium: There is no evidence of pericardial effusion. Mitral Valve: The mitral valve is normal in structure. No evidence of mitral valve regurgitation. No evidence of mitral valve stenosis. Tricuspid Valve: The tricuspid valve is normal in structure. Tricuspid valve regurgitation is trivial. Aortic Valve: The aortic valve is tricuspid. There is mild calcification of the aortic valve. Aortic valve regurgitation is not visualized. No aortic stenosis is present. Pulmonic Valve: The pulmonic valve was normal in structure. Pulmonic valve regurgitation is not visualized. Aorta: The aortic root is normal in size and structure. Venous: The inferior vena cava is dilated in size with greater than 50% respiratory variability, suggesting right atrial pressure of 8 mmHg. IAS/Shunts: No atrial level shunt detected by color flow Doppler.  LEFT VENTRICLE PLAX 2D LVIDd:         5.50 cm LVIDs:         3.60 cm LV PW:         1.10 cm LV IVS:        1.10 cm LVOT diam:     2.30 cm LVOT Area:     4.15 cm  RIGHT  VENTRICLE          IVC RV Basal diam:  3.50 cm  IVC diam: 2.50 cm LEFT ATRIUM             Index        RIGHT ATRIUM           Index LA diam:        4.10 cm 1.60 cm/m   RA Area:     20.00 cm LA Vol (A2C):   84.9 ml 33.20 ml/m  RA Volume:   59.60 ml  23.31 ml/m LA Vol (A4C):   80.1 ml 31.32 ml/m LA Biplane Vol: 82.4 ml 32.22 ml/m   AORTA Ao Root diam: 3.20 cm Ao Asc diam:  3.20 cm  SHUNTS Systemic Diam: 2.30 cm Dalton McleanMD Electronically signed by Wilfred Lacy Signature Date/Time: 10/01/2022/5:03:21 PM    Final    CT CHEST ABDOMEN PELVIS W CONTRAST  Result Date: 10/01/2022 CLINICAL DATA:  Sepsis hx ov cirrhosis with  TIPS EXAM: CT CHEST, ABDOMEN, AND PELVIS WITH CONTRAST TECHNIQUE: Multidetector CT imaging of the chest, abdomen and pelvis was performed following the standard protocol during bolus administration of intravenous contrast. RADIATION DOSE REDUCTION: This exam was performed according to the departmental dose-optimization program which includes automated exposure control, adjustment of the mA and/or kV according to patient size and/or use of iterative reconstruction technique. CONTRAST:  75mL OMNIPAQUE IOHEXOL 350 MG/ML SOLN COMPARISON:  CT abdomen pelvis 01/25/2015 FINDINGS: CT CHEST FINDINGS Cardiovascular: Normal heart size. No significant pericardial effusion. The thoracic aorta is normal in caliber. No atherosclerotic plaque of the thoracic aorta. No coronary artery calcifications. The main pulmonary artery is normal in caliber. No central pulmonary embolus. Mediastinum/Nodes: No enlarged mediastinal, hilar, or axillary lymph nodes. Thyroid gland, trachea, and esophagus demonstrate no significant findings. Small hiatal hernia. Lungs/Pleura: Limited evaluation due to motion artifact. Bilateral lower lobe passive atelectasis. No focal consolidation. No pulmonary nodule. No pulmonary mass. Trace bilateral pleural effusions, right greater than left. No pneumothorax. Musculoskeletal: Bilateral gynecomastia. No suspicious lytic or blastic osseous lesions. Likely subacute right anterior sixth rib fracture (3:47). Chronic midthoracic vertebral body vertebral body height loss with associated exaggerated kyphosis. CT ABDOMEN PELVIS FINDINGS Hepatobiliary: Tips procedure noted. No focal liver abnormality. Status post cholecystectomy. No biliary dilatation. Pancreas: No focal lesion. Normal pancreatic contour. No surrounding inflammatory changes. No main pancreatic ductal dilatation. Spleen: Normal in size without focal abnormality. Adrenals/Urinary Tract: No adrenal nodule bilaterally. Bilateral kidneys enhance  symmetrically. Punctate left nephrolithiasis No hydronephrosis. No hydroureter. The urinary bladder is unremarkable. Stomach/Bowel: Limited evaluation due to motion artifact stomach is within normal limits. No evidence of bowel wall thickening or dilatation. Appendix appears normal. Vascular/Lymphatic: Slightly increased trace periaortic fat stranding and prominent but nonenlarged lymph nodes (3:81-91) no abdominal aorta or iliac aneurysm. Mild atherosclerotic plaque of the aorta and its branches. Right inguinal lymphadenopathy with lymph node measuring up to 1.9 cm. Associated subcutaneus soft tissue edema. No abdominalor pelvic lymphadenopathy. Reproductive: Prostate is unremarkable. Scrotum collimated off view. Other: No intraperitoneal free fluid. No intraperitoneal free gas. No organized fluid collection. Musculoskeletal: Small fat containing umbilical hernia. No suspicious lytic or blastic osseous lesions. No acute displaced fracture. Multilevel degenerative changes of the spine. IMPRESSION: 1.  Trace bilateral pleural effusions, right greater than left. 2. Right inguinal lymphadenopathy. Please note scrotum collimated off view. Recommend correlation with physical exam. 3. Slightly increased trace periaortic fat stranding and prominent but nonenlarged lymph nodes. Question developing retroperitoneal fibrosis. Recommend attention  on follow-up. 4. Nonobstructive punctate left nephrolithiasis. 5. Likely subacute right anterior sixth rib fracture. Correlate with point tenderness to palpation to evaluate for an acute component. 6. TIPS procedure. Electronically Signed   By: Tish Frederickson M.D.   On: 10/01/2022 02:00   DG Chest Port 1 View  Result Date: 09/30/2022 CLINICAL DATA:  Possible sepsis fever EXAM: PORTABLE CHEST 1 VIEW COMPARISON:  02/04/2015 FINDINGS: Cardiomegaly with mild central congestion. No consolidation, pleural effusion or pneumothorax. Low lung volumes IMPRESSION: Cardiomegaly with mild  central congestion. Electronically Signed   By: Jasmine Pang M.D.   On: 09/30/2022 23:18     Scheduled Meds:  lactulose  30 g Oral BID   metoprolol tartrate  25 mg Oral BID   Continuous Infusions:  linezolid (ZYVOX) IV Stopped (10/02/22 1027)   penicillin G potassium 12 Million Units in dextrose 5 % 500 mL CONTINUOUS infusion 41.7 mL/hr at 10/02/22 1018     LOS: 1 day    Time spent:    Zannie Cove, MD [facility]   10/02/2022, 11:57 AM

## 2022-10-03 DIAGNOSIS — I4891 Unspecified atrial fibrillation: Secondary | ICD-10-CM | POA: Diagnosis not present

## 2022-10-03 LAB — CBC
HCT: 35.1 % — ABNORMAL LOW (ref 39.0–52.0)
Hemoglobin: 11.7 g/dL — ABNORMAL LOW (ref 13.0–17.0)
MCH: 30.2 pg (ref 26.0–34.0)
MCHC: 33.3 g/dL (ref 30.0–36.0)
MCV: 90.5 fL (ref 80.0–100.0)
Platelets: 143 10*3/uL — ABNORMAL LOW (ref 150–400)
RBC: 3.88 MIL/uL — ABNORMAL LOW (ref 4.22–5.81)
RDW: 15.5 % (ref 11.5–15.5)
WBC: 6.4 10*3/uL (ref 4.0–10.5)
nRBC: 0 % (ref 0.0–0.2)

## 2022-10-03 LAB — BASIC METABOLIC PANEL WITH GFR
Anion gap: 9 (ref 5–15)
BUN: 12 mg/dL (ref 6–20)
CO2: 23 mmol/L (ref 22–32)
Calcium: 8.3 mg/dL — ABNORMAL LOW (ref 8.9–10.3)
Chloride: 102 mmol/L (ref 98–111)
Creatinine, Ser: 0.74 mg/dL (ref 0.61–1.24)
GFR, Estimated: >60 mL/min (ref >60)
Glucose, Bld: 113 mg/dL — ABNORMAL HIGH (ref 70–99)
Potassium: 3.6 mmol/L (ref 3.5–5.1)
Sodium: 134 mmol/L — ABNORMAL LOW (ref 135–145)

## 2022-10-03 LAB — HEPATITIS B CORE ANTIBODY, TOTAL: Hep B Core Total Ab: NONREACTIVE

## 2022-10-03 LAB — HEPATITIS B SURFACE ANTIGEN: Hepatitis B Surface Ag: NONREACTIVE

## 2022-10-03 MED ORDER — SPIRONOLACTONE 25 MG PO TABS
25.0000 mg | ORAL_TABLET | Freq: Every day | ORAL | Status: DC
  Administered 2022-10-03 – 2022-10-08 (×6): 25 mg via ORAL
  Filled 2022-10-03 (×6): qty 1

## 2022-10-03 MED ORDER — FUROSEMIDE 40 MG PO TABS
40.0000 mg | ORAL_TABLET | Freq: Every day | ORAL | Status: DC
  Administered 2022-10-03 – 2022-10-08 (×6): 40 mg via ORAL
  Filled 2022-10-03 (×6): qty 1

## 2022-10-03 MED ORDER — OXYCODONE HCL 5 MG PO TABS
5.0000 mg | ORAL_TABLET | Freq: Four times a day (QID) | ORAL | Status: DC | PRN
  Administered 2022-10-03 – 2022-10-08 (×11): 5 mg via ORAL
  Filled 2022-10-03 (×12): qty 1

## 2022-10-03 MED ORDER — LACTULOSE 10 GM/15ML PO SOLN
20.0000 g | Freq: Two times a day (BID) | ORAL | Status: DC
  Administered 2022-10-04 – 2022-10-08 (×5): 20 g via ORAL
  Filled 2022-10-03 (×7): qty 30

## 2022-10-03 NOTE — Progress Notes (Signed)
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 -In the ED he was febrile to 102, tachycardic with SVT, lactate of 3.2, sodium 135, creatinine 0.8, WBC 7.9, hemoglobin 12, platelet 92 K, UA with amber-colored urine, large leukocytes, negative nitrite with rare bacteria, ammonia level 77, CT abdomen pelvis with IV contrast noted bilateral lower lobe atelectasis  Subjective: -Mild discomfort in his right thigh, otherwise feels okay  Assessment and Plan:  Strep pyogenes bacteremia Right thigh cellulitis -Blood cultures with strep bacteremia 2/2 -Had mild erythema of right thigh, follow-up CT femur with nonspecific soft tissue swelling, no abscess, inguinal adenopathy -ID consult appreciated, now on penicillin G and 48 hours of linezolid for antitoxin effect -Repeat blood cultures from yesterday negative so far -Increase activity  SVT -Does not appear to be in A-fib -Weaned off Cardizem gtt., continue low-dose metoprolol  Decompensated liver cirrhosis -Restart Lasix and Aldactone today -History of TIPS  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, poorly compliant with diuretics and lactulose -Continue lactulose -Ambulate, PT OT eval  Chronic thrombocytopenia -Secondary to liver disease  Hypokalemia -Repleted  Polysubstance abuse (HCC) -UDS positive for cannabis and amphetamines, counseled  S DOH, poor compliance with medications, follow-up, polysubstance abuse -TOC consult  DVT prophylaxis: SCDs  Code Status: Full code Family Communication: None present Disposition Plan: Home likely 48 hours  Consultants:  Infectious  disease  Procedures:   Antimicrobials:    Objective: Vitals:   10/02/22 2005 10/02/22 2326 10/03/22 0309 10/03/22 0958  BP: 125/77 116/68 125/76 126/80  Pulse: 61 66 65 82  Resp: 18 20 18    Temp: 98.4 F (36.9 C) 98.5 F (36.9 C) 98.4 F (36.9 C)   TempSrc: Oral Oral Oral   SpO2: 100% 98% 100%   Weight:      Height:        Intake/Output Summary (Last 24 hours) at 10/03/2022 1151 Last data filed at 10/03/2022 1042 Gross per 24 hour  Intake 1877.48 ml  Output 600 ml  Net 1277.48 ml   Filed Weights   10/01/22 1000 10/02/22 0246  Weight: (!) 136.2 kg 135.6 kg    Examination:  General exam: AAOx3, no distress HEENT: Positive JVD CVS: S1-S2, regular rhythm Abdomen: Soft, nontender, mildly distended, bowel sounds present Extremities: No edema, mild right upper thigh erythema  Skin: Mild erythema over upper thigh Psychiatry:  Mood & affect appropriate.     Data Reviewed:   CBC: Recent Labs  Lab 09/30/22 2314 09/30/22 2315 09/30/22 2333 10/01/22 0112 10/03/22 0306  WBC  --   --  7.9 6.5 6.4  NEUTROABS  --   --  7.3 5.8  --   HGB 12.6* 11.9* 12.0* 12.4* 11.7*  HCT 37.0* 35.0* 35.6* 37.2* 35.1*  MCV  --   --  91.8 92.1 90.5  PLT  --   --  92* 79* 143*   Basic Metabolic Panel: Recent Labs  Lab 09/30/22 2253 09/30/22 2254 09/30/22 2314 09/30/22 2315 10/01/22 0112 10/02/22 0231 10/03/22 0306  NA 135 135 135 135 133* 132* 134*  K 3.1* 3.3* 3.3* 3.3* 3.0* 3.5 3.6  CL 100 99  --  99 101 103 102  CO2  24  --   --   --  22 24 23   GLUCOSE 121* 118*  --  114* 115* 118* 113*  BUN 11 12  --  13 11 15 12   CREATININE 0.84 0.60*  --  0.60* 0.79 0.72 0.74  CALCIUM 8.3*  --   --   --  7.8* 8.5* 8.3*  MG  --   --   --   --  1.2* 1.9  --    GFR: Estimated Creatinine Clearance: 150.8 mL/min (by C-G formula based on SCr of 0.74 mg/dL). Liver Function Tests: Recent Labs  Lab 09/30/22 2253 10/01/22 0112 10/02/22 0231  AST 49* 41 27  ALT 44 40 34  ALKPHOS 72 66  77  BILITOT 2.2* 1.9* 1.3*  PROT 5.7* 5.1* 5.8*  ALBUMIN 2.1* 2.0* 2.1*   No results for input(s): "LIPASE", "AMYLASE" in the last 168 hours. Recent Labs  Lab 10/01/22 0115  AMMONIA 77*   Coagulation Profile: Recent Labs  Lab 09/30/22 2253  INR 1.5*   Cardiac Enzymes: No results for input(s): "CKTOTAL", "CKMB", "CKMBINDEX", "TROPONINI" in the last 168 hours. BNP (last 3 results) No results for input(s): "PROBNP" in the last 8760 hours. HbA1C: No results for input(s): "HGBA1C" in the last 72 hours. CBG: No results for input(s): "GLUCAP" in the last 168 hours. Lipid Profile: No results for input(s): "CHOL", "HDL", "LDLCALC", "TRIG", "CHOLHDL", "LDLDIRECT" in the last 72 hours. Thyroid Function Tests: Recent Labs    10/01/22 0112  TSH 1.222   Anemia Panel: No results for input(s): "VITAMINB12", "FOLATE", "FERRITIN", "TIBC", "IRON", "RETICCTPCT" in the last 72 hours. Urine analysis:    Component Value Date/Time   COLORURINE AMBER (A) 10/01/2022 0142   APPEARANCEUR CLOUDY (A) 10/01/2022 0142   LABSPEC 1.025 10/01/2022 0142   PHURINE 5.0 10/01/2022 0142   GLUCOSEU NEGATIVE 10/01/2022 0142   HGBUR SMALL (A) 10/01/2022 0142   BILIRUBINUR NEGATIVE 10/01/2022 0142   KETONESUR NEGATIVE 10/01/2022 0142   PROTEINUR 30 (A) 10/01/2022 0142   NITRITE NEGATIVE 10/01/2022 0142   LEUKOCYTESUR LARGE (A) 10/01/2022 0142   Sepsis Labs: @LABRCNTIP (procalcitonin:4,lacticidven:4)  ) Recent Results (from the past 240 hour(s))  Blood Culture (routine x 2)     Status: Abnormal   Collection Time: 09/30/22 10:46 PM   Specimen: BLOOD  Result Value Ref Range Status   Specimen Description BLOOD LEFT ANTECUBITAL  Final   Special Requests   Final    BOTTLES DRAWN AEROBIC AND ANAEROBIC Blood Culture adequate volume   Culture  Setup Time   Final    GRAM POSITIVE COCCI IN CHAINS AEROBIC BOTTLE ONLY CRITICAL RESULT CALLED TO, READ BACK BY AND VERIFIED WITH: PHARMD JESSICA MILLEN ON 10/01/22 @  1532 BY DRT    Culture (A)  Final    STREPTOCOCCUS PYOGENES HEALTH DEPARTMENT NOTIFIED Performed at [facility], [address], [place], [place] [address]    Report Status 10/03/2022 FINAL  Final   Organism ID, Bacteria STREPTOCOCCUS PYOGENES  Final      Susceptibility   Streptococcus pyogenes - MIC*    PENICILLIN <=0.06 SENSITIVE Sensitive     CEFTRIAXONE <=0.12 SENSITIVE Sensitive     ERYTHROMYCIN <=0.12 SENSITIVE Sensitive     LEVOFLOXACIN 1 SENSITIVE Sensitive     VANCOMYCIN 0.5 SENSITIVE Sensitive     * STREPTOCOCCUS PYOGENES  Blood Culture ID Panel (Reflexed)     Status: Abnormal   Collection Time: 09/30/22 10:46 PM  Result Value Ref Range Status   Enterococcus  faecalis NOT DETECTED NOT DETECTED Final   Enterococcus Faecium NOT DETECTED NOT DETECTED Final   Listeria monocytogenes NOT DETECTED NOT DETECTED Final   Staphylococcus species NOT DETECTED NOT DETECTED Final   Staphylococcus aureus (BCID) NOT DETECTED NOT DETECTED Final   Staphylococcus epidermidis NOT DETECTED NOT DETECTED Final   Staphylococcus lugdunensis NOT DETECTED NOT DETECTED Final   Streptococcus species DETECTED (A) NOT DETECTED Final    Comment: CRITICAL RESULT CALLED TO, READ BACK BY AND VERIFIED WITH: PHARMD JESSICA MILLEN ON 10/01/22 @ 1532 BY DRT    Streptococcus agalactiae NOT DETECTED NOT DETECTED Final   Streptococcus pneumoniae NOT DETECTED NOT DETECTED Final   Streptococcus pyogenes DETECTED (A) NOT DETECTED Final    Comment: CRITICAL RESULT CALLED TO, READ BACK BY AND VERIFIED WITH: PHARMD JESSICA MILLEN ON 10/01/22 @ 1532 BY DRT    A.calcoaceticus-baumannii NOT DETECTED NOT DETECTED Final   Bacteroides fragilis NOT DETECTED NOT DETECTED Final   Enterobacterales NOT DETECTED NOT DETECTED Final   Enterobacter cloacae complex NOT DETECTED NOT DETECTED Final   Escherichia coli NOT DETECTED NOT DETECTED Final   Klebsiella aerogenes NOT DETECTED NOT DETECTED Final   Klebsiella oxytoca NOT  DETECTED NOT DETECTED Final   Klebsiella pneumoniae NOT DETECTED NOT DETECTED Final   Proteus species NOT DETECTED NOT DETECTED Final   Salmonella species NOT DETECTED NOT DETECTED Final   Serratia marcescens NOT DETECTED NOT DETECTED Final   Haemophilus influenzae NOT DETECTED NOT DETECTED Final   Neisseria meningitidis NOT DETECTED NOT DETECTED Final   Pseudomonas aeruginosa NOT DETECTED NOT DETECTED Final   Stenotrophomonas maltophilia NOT DETECTED NOT DETECTED Final   Candida albicans NOT DETECTED NOT DETECTED Final   Candida auris NOT DETECTED NOT DETECTED Final   Candida glabrata NOT DETECTED NOT DETECTED Final   Candida krusei NOT DETECTED NOT DETECTED Final   Candida parapsilosis NOT DETECTED NOT DETECTED Final   Candida tropicalis NOT DETECTED NOT DETECTED Final   Cryptococcus neoformans/gattii NOT DETECTED NOT DETECTED Final    Comment: Performed at [facility], [address] [place], [place] [address]  Blood Culture (routine x 2)     Status: None (Preliminary result)   Collection Time: 09/30/22 10:53 PM   Specimen: BLOOD  Result Value Ref Range Status   Specimen Description BLOOD RIGHT ANTECUBITAL  Final   Special Requests   Final    BOTTLES DRAWN AEROBIC AND ANAEROBIC Blood Culture adequate volume   Culture   Final    NO GROWTH 3 DAYS Performed at [facility], [address], [place], [place] [address]    Report Status PENDING  Incomplete  Resp panel by RT-PCR (RSV, Flu A&B, Covid) Anterior Nasal Swab     Status: None   Collection Time: 09/30/22 10:56 PM   Specimen: Anterior Nasal Swab  Result Value Ref Range Status   SARS Coronavirus 2 by RT PCR NEGATIVE NEGATIVE Final   Influenza A by PCR NEGATIVE NEGATIVE Final   Influenza B by PCR NEGATIVE NEGATIVE Final    Comment: (NOTE) The Xpert Xpress SARS-CoV-2/FLU/RSV plus assay is intended as an aid in the diagnosis of influenza from Nasopharyngeal swab specimens and should not be used as a sole basis for  treatment. Nasal washings and aspirates are unacceptable for Xpert Xpress SARS-CoV-2/FLU/RSV testing.  Fact Sheet for Patients: BloggerCourse.com  Fact Sheet for Healthcare Providers: SeriousBroker.it  This test is not yet approved or cleared by the [place] FDA and has been authorized for detection  and/or diagnosis of SARS-CoV-2 by FDA under an Emergency Use Authorization (EUA). This EUA will remain in effect (meaning this test can be used) for the duration of the COVID-19 declaration under Section 564(b)(1) of the Act, 21 U.S.C. section 360bbb-3(b)(1), unless the authorization is terminated or revoked.     Resp Syncytial Virus by PCR NEGATIVE NEGATIVE Final    Comment: (NOTE) Fact Sheet for Patients: BloggerCourse.com  Fact Sheet for Healthcare Providers: SeriousBroker.it  This test is not yet approved or cleared by the [place] FDA and has been authorized for detection and/or diagnosis of SARS-CoV-2 by FDA under an Emergency Use Authorization (EUA). This EUA will remain in effect (meaning this test can be used) for the duration of the COVID-19 declaration under Section 564(b)(1) of the Act, 21 U.S.C. section 360bbb-3(b)(1), unless the authorization is terminated or revoked.  Performed at [facility], [address] [place], [place] [address]   Urine Culture     Status: None   Collection Time: 10/01/22  1:42 AM   Specimen: Urine, Random  Result Value Ref Range Status   Specimen Description URINE, RANDOM  Final   Special Requests NONE Reflexed from [id number]  Final   Culture   Final    NO GROWTH Performed at [facility], [address], [place], [place] [address]    Report Status 10/02/2022 FINAL  Final  Culture, blood (Routine X 2) w Reflex to ID Panel     Status: None (Preliminary result)   Collection Time: 10/02/22 12:15 PM   Specimen: BLOOD  RIGHT FOREARM  Result Value Ref Range Status   Specimen Description BLOOD RIGHT FOREARM  Final   Special Requests   Final    BOTTLES DRAWN AEROBIC ONLY Blood Culture adequate volume   Culture   Final    NO GROWTH < 24 HOURS Performed at [facility], [address] [place], [place] [address]    Report Status PENDING  Incomplete  Culture, blood (Routine X 2) w Reflex to ID Panel     Status: None (Preliminary result)   Collection Time: 10/02/22 12:15 PM   Specimen: BLOOD LEFT HAND  Result Value Ref Range Status   Specimen Description BLOOD LEFT HAND  Final   Special Requests   Final    BOTTLES DRAWN AEROBIC AND ANAEROBIC Blood Culture adequate volume   Culture   Final    NO GROWTH < 24 HOURS Performed at [facility], [address] [place], [place] [address]    Report Status PENDING  Incomplete     Radiology Studies: CT FEMUR RIGHT W CONTRAST  Result Date: 10/02/2022 CLINICAL DATA:  Right thigh soft tissue infection. EXAM: CT OF THE LOWER RIGHT EXTREMITY WITH CONTRAST TECHNIQUE: Multidetector CT imaging of the lower right extremity was performed according to the standard protocol following intravenous contrast administration. RADIATION DOSE REDUCTION: This exam was performed according to the departmental dose-optimization program which includes automated exposure control, adjustment of the mA and/or kV according to patient size and/or use of iterative reconstruction technique. CONTRAST:  OMNIPAQUE IOHEXOL 350 MG/ML SOLN COMPARISON:  None Available. FINDINGS: Bones/Joint/Cartilage No fracture or dislocation. Mild degenerative changes of the right hip and knee. No significant joint effusion. Ligaments Ligaments are suboptimally evaluated by CT. Muscles and Tendons Grossly intact. Soft tissue Multiple enlarged right inguinal lymph nodes measuring up to 2.1 cm in short axis. Nonspecific soft tissue swelling of the right thigh. No fluid collection or subcutaneous emphysema.  No  soft tissue mass. IMPRESSION: 1. Nonspecific soft tissue swelling of the right thigh. No abscess. 2. Right inguinal lymphadenopathy may be reactive given presumed right thigh cellulitis. Lymphoproliferative disease is considered less likely given lack of pathologic lymphadenopathy in the chest, abdomen, or pelvis on yesterday's CT. 3.  No acute osseous abnormality. Electronically Signed   By: Obie Dredge M.D.   On: 10/02/2022 08:11   DG Chest 2 View  Result Date: 10/01/2022 CLINICAL DATA:  Sepsis, fever and chills EXAM: CHEST - 2 VIEW COMPARISON:  The 124 FINDINGS: Frontal and lateral views of the chest demonstrate a stable cardiac silhouette. Increased central vascular congestion, without focal consolidation. Trace bilateral effusions. No pneumothorax. IMPRESSION: 1. Central vascular congestion without pulmonary edema. 2. Trace bilateral pleural effusions. Electronically Signed   By: Sharlet Salina M.D.   On: 10/01/2022 17:53   ECHOCARDIOGRAM COMPLETE  Result Date: 10/01/2022    ECHOCARDIOGRAM REPORT   Patient Name:   DAEVON HOLDREN Date of Exam: 10/01/2022 Medical Rec #:  [id number]          Height:       73.0 in Accession #:    [id number]         Weight:       300.3 lb Date of Birth:  1965-09-03         BSA:          2.557 m Patient Age:    56 years           BP:           113/74 mmHg Patient Gender: M                  HR:           84 bpm. Exam Location:  Inpatient Procedure: 2D Echo, Intracardiac Opacification Agent, Color Doppler and Cardiac            Doppler Indications:    Atrial Fibrillation  History:        Patient has no prior history of Echocardiogram examinations.                 Cirrhosis, Arrythmias:Atrial Fibrillation; Risk                 Factors:Poly-substance abuse.  Sonographer:    Delcie Roch RDCS Referring Phys: [phone] ERIC CHEN IMPRESSIONS  1. Left ventricular ejection fraction, by estimation, is 55 to 60%. The left ventricle has normal function. The left ventricle has no  regional wall motion abnormalities. Left ventricular diastolic parameters are indeterminate.  2. Right ventricular systolic function is normal. The right ventricular size is normal. Tricuspid regurgitation signal is inadequate for assessing PA pressure.  3. Left atrial size was mildly dilated.  4. Right atrial size was mildly dilated.  5. The mitral valve is normal in structure. No evidence of mitral valve regurgitation. No evidence of mitral stenosis.  6. The aortic valve is tricuspid. There is mild calcification of the aortic valve. Aortic valve regurgitation is not visualized. No aortic stenosis is present.  7. The inferior vena cava is dilated in size with >50% respiratory variability, suggesting right atrial pressure of 8 mmHg.  8. The patient was in atrial fibrillation. FINDINGS  Left Ventricle: Left ventricular ejection fraction, by estimation, is 55 to 60%. The left ventricle has normal function. The left ventricle has no regional wall motion abnormalities. Definity contrast agent was given IV to delineate the left ventricular  endocardial borders. The  left ventricular internal cavity size was normal in size. There is no left ventricular hypertrophy. Left ventricular diastolic parameters are indeterminate. Right Ventricle: The right ventricular size is normal. No increase in right ventricular wall thickness. Right ventricular systolic function is normal. Tricuspid regurgitation signal is inadequate for assessing PA pressure. Left Atrium: Left atrial size was mildly dilated. Right Atrium: Right atrial size was mildly dilated. Pericardium: There is no evidence of pericardial effusion. Mitral Valve: The mitral valve is normal in structure. No evidence of mitral valve regurgitation. No evidence of mitral valve stenosis. Tricuspid Valve: The tricuspid valve is normal in structure. Tricuspid valve regurgitation is trivial. Aortic Valve: The aortic valve is tricuspid. There is mild calcification of the aortic  valve. Aortic valve regurgitation is not visualized. No aortic stenosis is present. Pulmonic Valve: The pulmonic valve was normal in structure. Pulmonic valve regurgitation is not visualized. Aorta: The aortic root is normal in size and structure. Venous: The inferior vena cava is dilated in size with greater than 50% respiratory variability, suggesting right atrial pressure of 8 mmHg. IAS/Shunts: No atrial level shunt detected by color flow Doppler.  LEFT VENTRICLE PLAX 2D LVIDd:         5.50 cm LVIDs:         3.60 cm LV PW:         1.10 cm LV IVS:        1.10 cm LVOT diam:     2.30 cm LVOT Area:     4.15 cm  RIGHT VENTRICLE          IVC RV Basal diam:  3.50 cm  IVC diam: 2.50 cm LEFT ATRIUM             Index        RIGHT ATRIUM           Index LA diam:        4.10 cm 1.60 cm/m   RA Area:     20.00 cm LA Vol (A2C):   84.9 ml 33.20 ml/m  RA Volume:   59.60 ml  23.31 ml/m LA Vol (A4C):   80.1 ml 31.32 ml/m LA Biplane Vol: 82.4 ml 32.22 ml/m   AORTA Ao Root diam: 3.20 cm Ao Asc diam:  3.20 cm  SHUNTS Systemic Diam: 2.30 cm Dalton McleanMD Electronically signed by Wilfred Lacy Signature Date/Time: 10/01/2022/5:03:21 PM    Final      Scheduled Meds:  furosemide  40 mg Oral Daily   lactulose  20 g Oral BID   metoprolol tartrate  25 mg Oral BID   spironolactone  25 mg Oral Daily   Continuous Infusions:  linezolid (ZYVOX) IV 600 mg (10/03/22 1041)   penicillin G potassium 12 Million Units in dextrose 5 % 500 mL CONTINUOUS infusion 41.7 mL/hr at 10/03/22 1042     LOS: 2 days    Time spent:    Zannie Cove, MD [facility]   10/03/2022, 11:51 AM

## 2022-10-04 ENCOUNTER — Inpatient Hospital Stay (HOSPITAL_COMMUNITY): Payer: Commercial Managed Care - HMO

## 2022-10-04 DIAGNOSIS — B955 Unspecified streptococcus as the cause of diseases classified elsewhere: Secondary | ICD-10-CM | POA: Diagnosis not present

## 2022-10-04 DIAGNOSIS — L03115 Cellulitis of right lower limb: Secondary | ICD-10-CM | POA: Diagnosis not present

## 2022-10-04 DIAGNOSIS — R7881 Bacteremia: Secondary | ICD-10-CM | POA: Diagnosis not present

## 2022-10-04 DIAGNOSIS — I4891 Unspecified atrial fibrillation: Secondary | ICD-10-CM | POA: Diagnosis not present

## 2022-10-04 MED ORDER — IOHEXOL 350 MG/ML SOLN
75.0000 mL | Freq: Once | INTRAVENOUS | Status: AC | PRN
  Administered 2022-10-04: 75 mL via INTRAVENOUS

## 2022-10-04 MED ORDER — LINEZOLID 600 MG/300ML IV SOLN
600.0000 mg | Freq: Two times a day (BID) | INTRAVENOUS | Status: DC
  Administered 2022-10-04: 600 mg via INTRAVENOUS
  Filled 2022-10-04 (×2): qty 300

## 2022-10-04 NOTE — TOC Initial Note (Signed)
Transition of Care [facility]) - Initial/Assessment Note    Patient Details  Name: Jonathan Ibarra MRN: [id number] Date of Birth: [birth year]  Transition of Care [facility]) CM/SW Contact:    Delilah Shan, LCSWA Phone Number: 10/04/2022, 4:15 PM  Clinical Narrative:                  CSW received consult for patient. Patient reports PTA he comes from home with friend [name]. CSW offered patient outpatient substance use treatment services resources. Patient accepted. Patient reports his plan is to return back home when medically ready for dc. All questions answered. No further questions reported at this time.       Patient Goals and CMS Choice            Expected Discharge Plan and Services                                              Prior Living Arrangements/Services                       Activities of Daily Living Home Assistive Devices/Equipment: None ADL Screening (condition at time of admission) Patient's cognitive ability adequate to safely complete daily activities?: Yes Is the patient deaf or have difficulty hearing?: No Does the patient have difficulty seeing, even when wearing glasses/contacts?: No Does the patient have difficulty concentrating, remembering, or making decisions?: Yes Patient able to express need for assistance with ADLs?: No Does the patient have difficulty dressing or bathing?: No Independently performs ADLs?: Yes (appropriate for developmental age) Does the patient have difficulty walking or climbing stairs?: No Weakness of Legs: None Weakness of Arms/Hands: None  Permission Sought/Granted                  Emotional Assessment              Admission diagnosis:  Rapid atrial fibrillation (HCC) [I48.91] Altered mental status, unspecified altered mental status type [R41.82] Sepsis without acute organ dysfunction, due to unspecified organism [facility]) [A41.9] Patient Active Problem List   Diagnosis Date Noted   Rapid  atrial fibrillation (HCC) 10/01/2022   SIRS without infection with organ dysfunction (HCC) 10/01/2022   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   Transaminitis 02/07/2015   Polysubstance abuse (HCC) 02/06/2015   Hypokalemia 02/06/2015   Hepatic encephalopathy (HCC) 02/04/2015   Thrombocytopenia (HCC) 02/04/2015   Benign essential HTN 02/04/2015   Hepatic cirrhosis (HCC) 02/04/2015   PCP:  Candi Leash, MD Pharmacy:   [facility] [phone] - [place], [place] - [address] AT [facility] OF [address] [place] [address] Phone: [phone] Fax: [phone]  [facility] #[id number] [place], [place] - [address] AT [facility] OF [address] [place] [address] Phone: [phone] Fax: [phone]  [facility] [address] [place] [address] Phone: [phone] Fax: [phone]     Social Determinants of Health (SDOH) Social History: SDOH Screenings   Food Insecurity: No Food Insecurity (10/01/2022)  Housing: Low Risk  (10/01/2022)  Transportation Needs: No Transportation Needs (10/01/2022)  Utilities: Not At Risk (10/01/2022)  Financial Resource Strain: Medium Risk (01/07/2020)   Received from [facility], [facility]  Social Connections: Unknown (07/05/2021)  Received from [facility]  Stress: No Stress Concern Present (01/07/2020)   Received from [facility], [facility]  Tobacco Use: High Risk (10/01/2022)   SDOH Interventions:     Readmission Risk Interventions     No data to display

## 2022-10-04 NOTE — Progress Notes (Signed)
Physical Therapy Treatment Patient Details Name: Jonathan Ibarra MRN: [id number] DOB: November 22, 1965 Today's Date: 10/04/2022   History of Present Illness Pt is a 57 y/o male presenting 8/1 to the ED via EMS due to AMS, pt lying in bed for 2 days.  PMH  Alcohol abuse, [name], HTN, liver cirrhosis, polysubstance abuse.    PT Comments  Pt is presenting close to baseline. Pt demonstrates mild balance and gait deficits. Pt has improved from initial evaluation. Pt was able to perform 2 steps today with rail at supervision level and assistance with IV. Pt has slightly impaired problem solving as demonstrated by requiring cues for directions with IV line. Due to pt current functional status, home setup and available assistance no recommended skilled physical therapy services at this time on discharge from acute care hospital setting. Pt will benefit from continued skilled physical therapy services in acute setting in order to ensure pt returns home safe with decrease risk for falls and re-hospitalization.           Equipment Recommendations  None recommended by PT    Recommendations for Other Services       Precautions / Restrictions Precautions Precautions: Fall Restrictions Weight Bearing Restrictions: No     Mobility  Bed Mobility Overal bed mobility: Independent Bed Mobility: Supine to Sit, Sit to Supine             Patient Response: Cooperative  Transfers Overall transfer level: Modified independent Equipment used: None Transfers: Sit to/from Stand Sit to Stand: Modified independent (Device/Increase time)           General transfer comment: from lower bed    Ambulation/Gait Ambulation/Gait assistance: Supervision Gait Distance (Feet): 250 Feet Assistive device: IV Pole Gait Pattern/deviations: Step-through pattern   Gait velocity interpretation: 1.31 - 2.62 ft/sec, indicative of limited community ambulator   General Gait Details: mild instability no overt  LOB   Stairs Stairs: Yes Stairs assistance: Supervision Stair Management: One rail Right, Step to pattern Number of Stairs: 2 General stair comments: No significant deficits noted.   Wheelchair Mobility     Tilt Bed Tilt Bed Patient Response: Cooperative  Modified Rankin (Stroke Patients Only)       Balance Overall balance assessment: Mild deficits observed, not formally tested      Cognition Arousal/Alertness: Awake/alert Behavior During Therapy: WFL for tasks assessed/performed, Impulsive Overall Cognitive Status: Within Functional Limits for tasks assessed               Pertinent Vitals/Pain Pain Assessment Pain Assessment: No/denies pain           PT Goals (current goals can now be found in the care plan section) Acute Rehab PT Goals Patient Stated Goal: agreed, needed to be independent PT Goal Formulation: With patient Time For Goal Achievement: 10/15/22 Potential to Achieve Goals: Good Progress towards PT goals: Progressing toward goals    Frequency    Min 1X/week      PT Plan Current plan remains appropriate       AM-PAC PT "6 Clicks" Mobility   Outcome Measure  Help needed turning from your back to your side while in a flat bed without using bedrails?: None Help needed moving from lying on your back to sitting on the side of a flat bed without using bedrails?: None Help needed moving to and from a bed to a chair (including a wheelchair)?: None Help needed standing up from a chair using your arms (e.g., wheelchair or bedside chair)?:  None Help needed to walk in hospital room?: A Little Help needed climbing 3-5 steps with a railing? : A Little 6 Click Score: 22    End of Session Equipment Utilized During Treatment: Gait belt Activity Tolerance: Patient tolerated treatment well Patient left: in bed;with call bell/phone within reach Nurse Communication: Mobility status PT Visit Diagnosis: Unsteadiness on feet (R26.81);Muscle weakness  (generalized) (M62.81)     Time: 2542-7062 PT Time Calculation (min) (ACUTE ONLY): 14 min  Charges:    $Therapeutic Activity: 8-22 mins PT General Charges $$ ACUTE PT VISIT: 1 Visit                    Harrel Carina, DPT, CLT  Acute Rehabilitation Services Office: [phone] (Secure chat preferred)    Claudia Desanctis 10/04/2022, 4:34 PM

## 2022-10-04 NOTE — Progress Notes (Signed)
[facility] Infectious Diseases Follow Up Note  Patient Identification: Patient Name: Jonathan Ibarra MRN: [id number] Admit Date: 09/30/2022 10:24 PM Age: 57 y.o.Today's Date: 10/04/2022  Reason for Visit: bacteremia/rt thigh cellulitis   Principal Problem:   Rapid atrial fibrillation (HCC) Active Problems:   Hepatic cirrhosis (HCC)   Polysubstance abuse (HCC)   Hypokalemia   S/P TIPS (transjugular intrahepatic portosystemic shunt)   SIRS without infection with organ dysfunction (HCC)  Current Antibiotics:  Penicillin G 8/2-c Linezolid 8/2-c Total days of antibiotics D 5   Lines/Hardware:  Interval Events: remains afebrile Labs remarkable for Cr elevated to 1.26  Assessment 57 year old male with prior history of colic liver cirrhosis s/p TIPS, hypertension, polysubstance abuse, hepatitis C, seizure disorder, GERD brought to the ED on 8/1 with fever, chills altered mental status, nausea vomiting and diarrhea. Admitted with   # Streptococcal bacteremia 2/2 -does not appear to be in toxic shock syndrome  # Right thigh cellulitis, involving overlying skin rt knee  - Pain and tenderness out of proportion on exam. No similar erythema elsewhere. Present on admit and unlikely abtx related. - CT rt femur 8/3 Nonspecific soft tissue swelling of the right thigh. No abscess.   # Chronic Thrombocytopenia - improving  # AKI  # Alcoholic liver cirrhosis s/p TIPS - no signs of peritonitis on exam  # H/o HCV - 8/4 HCV RNA negative   Recommendations Continue penicillin G Continue linezolid pending surgery evaluation to r/o nec fasc.  Surgery has been consulted by Dr. Ivor Messier HBV serology  Monitor CBC and BMP  Rest of the management as per the primary team. Thank you for the consult. Please page with pertinent questions or concerns.  ______________________________________________________________________ Subjective patient seen  and examined at the bedside.  More pain and tenderness in the right lateral thigh.  But ambulatory  Vitals BP 130/82 (BP Location: Left Arm)   Pulse 94   Temp 97.6 F (36.4 C) (Axillary)   Resp 18   Ht 6\' 1"  (1.854 m)   Wt 135.5 kg   SpO2 100%   BMI 39.42 kg/m     Physical Exam Constitutional: adult male sitting in the bed and appears comfortable    Comments:   Cardiovascular:     Rate and Rhythm: Normal rate and regular rhythm.     Heart sounds:  Pulmonary:     Effort: Pulmonary effort is normal on room air     Comments:   Abdominal:     Palpations: Abdomen is soft.     Tenderness: Nondistended and nontender  Musculoskeletal:        General: Right lateral thigh with redness and tenderness including overlying the right knee.  No crepitus or fluctuance.  No signs of septic right knee.  No similar redness in other parts of the body  Skin:    Comments:  Rt knee    Rt thigh     Neurological:     General: Awake, alert and oriented, following commands  Psychiatric:        Mood and Affect: Mood normal.   Pertinent Microbiology Results for orders placed or performed during the hospital encounter of 09/30/22  Blood Culture (routine x 2)     Status: Abnormal   Collection Time: 09/30/22 10:46 PM   Specimen: BLOOD  Result Value Ref Range Status   Specimen Description BLOOD LEFT ANTECUBITAL  Final   Special Requests   Final    BOTTLES DRAWN AEROBIC AND ANAEROBIC Blood Culture adequate  volume   Culture  Setup Time   Final    GRAM POSITIVE COCCI IN CHAINS AEROBIC BOTTLE ONLY CRITICAL RESULT CALLED TO, READ BACK BY AND VERIFIED WITH: PHARMD JESSICA MILLEN ON 10/01/22 @ 1532 BY DRT    Culture (A)  Final    STREPTOCOCCUS PYOGENES HEALTH DEPARTMENT NOTIFIED Performed at [facility], [address] [place], [place] [address]    Report Status 10/03/2022 FINAL  Final   Organism ID, Bacteria STREPTOCOCCUS PYOGENES  Final      Susceptibility   Streptococcus  pyogenes - MIC*    PENICILLIN <=0.06 SENSITIVE Sensitive     CEFTRIAXONE <=0.12 SENSITIVE Sensitive     ERYTHROMYCIN <=0.12 SENSITIVE Sensitive     LEVOFLOXACIN 1 SENSITIVE Sensitive     VANCOMYCIN 0.5 SENSITIVE Sensitive     * STREPTOCOCCUS PYOGENES  Blood Culture ID Panel (Reflexed)     Status: Abnormal   Collection Time: 09/30/22 10:46 PM  Result Value Ref Range Status   Enterococcus faecalis NOT DETECTED NOT DETECTED Final   Enterococcus Faecium NOT DETECTED NOT DETECTED Final   Listeria monocytogenes NOT DETECTED NOT DETECTED Final   Staphylococcus species NOT DETECTED NOT DETECTED Final   Staphylococcus aureus (BCID) NOT DETECTED NOT DETECTED Final   Staphylococcus epidermidis NOT DETECTED NOT DETECTED Final   Staphylococcus lugdunensis NOT DETECTED NOT DETECTED Final   Streptococcus species DETECTED (A) NOT DETECTED Final    Comment: CRITICAL RESULT CALLED TO, READ BACK BY AND VERIFIED WITH: PHARMD JESSICA MILLEN ON 10/01/22 @ 1532 BY DRT    Streptococcus agalactiae NOT DETECTED NOT DETECTED Final   Streptococcus pneumoniae NOT DETECTED NOT DETECTED Final   Streptococcus pyogenes DETECTED (A) NOT DETECTED Final    Comment: CRITICAL RESULT CALLED TO, READ BACK BY AND VERIFIED WITH: PHARMD JESSICA MILLEN ON 10/01/22 @ 1532 BY DRT    A.calcoaceticus-baumannii NOT DETECTED NOT DETECTED Final   Bacteroides fragilis NOT DETECTED NOT DETECTED Final   Enterobacterales NOT DETECTED NOT DETECTED Final   Enterobacter cloacae complex NOT DETECTED NOT DETECTED Final   Escherichia coli NOT DETECTED NOT DETECTED Final   Klebsiella aerogenes NOT DETECTED NOT DETECTED Final   Klebsiella oxytoca NOT DETECTED NOT DETECTED Final   Klebsiella pneumoniae NOT DETECTED NOT DETECTED Final   Proteus species NOT DETECTED NOT DETECTED Final   Salmonella species NOT DETECTED NOT DETECTED Final   Serratia marcescens NOT DETECTED NOT DETECTED Final   Haemophilus influenzae NOT DETECTED NOT DETECTED Final    Neisseria meningitidis NOT DETECTED NOT DETECTED Final   Pseudomonas aeruginosa NOT DETECTED NOT DETECTED Final   Stenotrophomonas maltophilia NOT DETECTED NOT DETECTED Final   Candida albicans NOT DETECTED NOT DETECTED Final   Candida auris NOT DETECTED NOT DETECTED Final   Candida glabrata NOT DETECTED NOT DETECTED Final   Candida krusei NOT DETECTED NOT DETECTED Final   Candida parapsilosis NOT DETECTED NOT DETECTED Final   Candida tropicalis NOT DETECTED NOT DETECTED Final   Cryptococcus neoformans/gattii NOT DETECTED NOT DETECTED Final    Comment: Performed at [facility], [address], [place], [place] [address]  Blood Culture (routine x 2)     Status: None (Preliminary result)   Collection Time: 09/30/22 10:53 PM   Specimen: BLOOD  Result Value Ref Range Status   Specimen Description BLOOD RIGHT ANTECUBITAL  Final   Special Requests   Final    BOTTLES DRAWN AEROBIC AND ANAEROBIC Blood Culture adequate volume   Culture   Final  NO GROWTH 4 DAYS Performed at [facility], [address] [place], [place] [address]    Report Status PENDING  Incomplete  Resp panel by RT-PCR (RSV, Flu A&B, Covid) Anterior Nasal Swab     Status: None   Collection Time: 09/30/22 10:56 PM   Specimen: Anterior Nasal Swab  Result Value Ref Range Status   SARS Coronavirus 2 by RT PCR NEGATIVE NEGATIVE Final   Influenza A by PCR NEGATIVE NEGATIVE Final   Influenza B by PCR NEGATIVE NEGATIVE Final    Comment: (NOTE) The Xpert Xpress SARS-CoV-2/FLU/RSV plus assay is intended as an aid in the diagnosis of influenza from Nasopharyngeal swab specimens and should not be used as a sole basis for treatment. Nasal washings and aspirates are unacceptable for Xpert Xpress SARS-CoV-2/FLU/RSV testing.  Fact Sheet for Patients: BloggerCourse.com  Fact Sheet for Healthcare Providers: SeriousBroker.it  This test is not yet approved or cleared  by the [place] FDA and has been authorized for detection and/or diagnosis of SARS-CoV-2 by FDA under an Emergency Use Authorization (EUA). This EUA will remain in effect (meaning this test can be used) for the duration of the COVID-19 declaration under Section 564(b)(1) of the Act, 21 U.S.C. section 360bbb-3(b)(1), unless the authorization is terminated or revoked.     Resp Syncytial Virus by PCR NEGATIVE NEGATIVE Final    Comment: (NOTE) Fact Sheet for Patients: BloggerCourse.com  Fact Sheet for Healthcare Providers: SeriousBroker.it  This test is not yet approved or cleared by the [place] FDA and has been authorized for detection and/or diagnosis of SARS-CoV-2 by FDA under an Emergency Use Authorization (EUA). This EUA will remain in effect (meaning this test can be used) for the duration of the COVID-19 declaration under Section 564(b)(1) of the Act, 21 U.S.C. section 360bbb-3(b)(1), unless the authorization is terminated or revoked.  Performed at [facility], [address], [place], [place] [address]   Urine Culture     Status: None   Collection Time: 10/01/22  1:42 AM   Specimen: Urine, Random  Result Value Ref Range Status   Specimen Description URINE, RANDOM  Final   Special Requests NONE Reflexed from [id number]  Final   Culture   Final    NO GROWTH Performed at [facility], [address] [place], [place] [address]    Report Status 10/02/2022 FINAL  Final  Culture, blood (Routine X 2) w Reflex to ID Panel     Status: None (Preliminary result)   Collection Time: 10/02/22 12:15 PM   Specimen: BLOOD RIGHT FOREARM  Result Value Ref Range Status   Specimen Description BLOOD RIGHT FOREARM  Final   Special Requests   Final    BOTTLES DRAWN AEROBIC ONLY Blood Culture adequate volume   Culture   Final    NO GROWTH 2 DAYS Performed at [facility], [address] [place], [place] [address]     Report Status PENDING  Incomplete  Culture, blood (Routine X 2) w Reflex to ID Panel     Status: None (Preliminary result)   Collection Time: 10/02/22 12:15 PM   Specimen: BLOOD LEFT HAND  Result Value Ref Range Status   Specimen Description BLOOD LEFT HAND  Final   Special Requests   Final    BOTTLES DRAWN AEROBIC AND ANAEROBIC Blood Culture adequate volume   Culture   Final    NO GROWTH 2 DAYS Performed at [facility], [address],  [place], [place] [address]    Report Status PENDING  Incomplete    Pertinent Lab.    Latest Ref Rng & Units 10/03/2022    3:06 AM 10/01/2022    1:12 AM 09/30/2022   11:33 PM  CBC  WBC 4.0 - 10.5 K/uL 6.4  6.5  7.9   Hemoglobin 13.0 - 17.0 g/dL 82.9  56.2  13.0   Hematocrit 39.0 - 52.0 % 35.1  37.2  35.6   Platelets 150 - 400 K/uL 143  79  92       Latest Ref Rng & Units 10/04/2022    7:12 AM 10/03/2022    3:06 AM 10/02/2022    2:31 AM  CMP  Glucose 70 - 99 mg/dL 92  865  784   BUN 6 - 20 mg/dL 9  12  15    Creatinine 0.61 - 1.24 mg/dL 6.96  2.95  2.84   Sodium 135 - 145 mmol/L 133  134  132   Potassium 3.5 - 5.1 mmol/L 4.2  3.6  3.5   Chloride 98 - 111 mmol/L 103  102  103   CO2 22 - 32 mmol/L 23  23  24    Calcium 8.9 - 10.3 mg/dL 8.0  8.3  8.5   Total Protein 6.5 - 8.1 g/dL   5.8   Total Bilirubin 0.3 - 1.2 mg/dL   1.3   Alkaline Phos 38 - 126 U/L   77   AST 15 - 41 U/L   27   ALT 0 - 44 U/L   34      Pertinent Imaging today Plain films and CT images have been personally visualized and interpreted; radiology reports have been reviewed. Decision making incorporated into the Impression  No results found.   I have personally spent 51 minutes involved in face-to-face and non-face-to-face activities for this patient on the day of the visit. Professional time spent includes the following activities: Preparing to see the patient (review of tests), Obtaining and/or reviewing separately obtained history (admission/discharge record),  Performing a medically appropriate examination and/or evaluation , Ordering medications/tests/procedures, referring and communicating with other health care professionals, Documenting clinical information in the EMR, Independently interpreting results (not separately reported), Communicating results to the patient/family/caregiver, Counseling and educating the patient/family/caregiver and Care coordination (not separately reported).   Plan d/w requesting provider as well as ID pharm D  Note: This document was prepared using dragon voice recognition software and may include unintentional dictation errors.   Electronically signed by:   Odette Fraction, MD Infectious Disease Physician [facility] Pager: [phone]

## 2022-10-04 NOTE — Progress Notes (Signed)
CT reviewed. No acute findings. Imaging c/w cellulitis. Recommend continuation of abx.   Diamantina Monks, MD General and Trauma Surgery [facility]

## 2022-10-04 NOTE — Progress Notes (Signed)
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 -In the ED he was febrile to 102, tachycardic with SVT, lactate of 3.2, sodium 135, creatinine 0.8, WBC 7.9, hemoglobin 12, platelet 92 K, UA with amber-colored urine, large leukocytes, negative nitrite with rare bacteria, ammonia level 77, CT abdomen pelvis with IV contrast noted bilateral lower lobe atelectasis, noted to have area of erythema and tenderness involving right upper lateral thigh  Subjective: -Continues to have pain and discomfort in his right lateral thigh  Assessment and Plan:  Strep pyogenes bacteremia Right thigh cellulitis -Appearance not classic for cellulitis, and pain/tenderness is out of proportion -Blood cultures with strep bacteremia 2/2 -CT femur with nonspecific soft tissue swelling, no abscess, inguinal adenopathy -ID consult appreciated, now on penicillin G and 48 hours of linezolid for antitoxin effect -Repeat blood cultures are negative -Increase activity -General Surgery consult requested  SVT -Does not appear to be in A-fib -Weaned off Cardizem gtt., continue low-dose metoprolol  Decompensated liver cirrhosis -Restart Lasix and Aldactone -History of TIPS  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, poorly compliant with diuretics and lactulose -Improved, continue lactulose -Ambulate, PT OT eval  Chronic thrombocytopenia -Secondary to liver disease  Hypokalemia -Repleted  Polysubstance abuse (HCC) -UDS positive for cannabis and amphetamines, counseled  S DOH, poor compliance with medications, follow-up, polysubstance abuse -TOC consult  DVT  prophylaxis: SCDs  Code Status: Full code Family Communication: None present Disposition Plan: Home likely 48 hours  Consultants:  Infectious disease  Procedures:   Antimicrobials:    Objective: Vitals:   10/03/22 1324 10/03/22 2103 10/04/22 0359 10/04/22 1040  BP: 100/73 105/60 130/82 128/79  Pulse: 89 99 94 99  Resp: 19 16 18 18   Temp: 98.5 F (36.9 C) 97.7 F (36.5 C) 97.6 F (36.4 C) 97.8 F (36.6 C)  TempSrc: Oral Oral Axillary Oral  SpO2:  100%  100%  Weight:   135.5 kg   Height:        Intake/Output Summary (Last 24 hours) at 10/04/2022 1204 Last data filed at 10/04/2022 1041 Gross per 24 hour  Intake 1355.24 ml  Output 2300 ml  Net -944.76 ml   Filed Weights   10/01/22 1000 10/02/22 0246 10/04/22 0359  Weight: (!) 136.2 kg 135.6 kg 135.5 kg    Examination:  General exam: AAOx3, no distress HEENT: Positive JVD CVS: S1-S2, regular rhythm Abdomen: Soft, nontender, mildly distended, bowel sounds present Extremities: No edema, mild right upper thigh erythema  Skin: Erythema and tenderness over right lateral thigh Psychiatry:  Mood & affect appropriate.     Data Reviewed:   CBC: Recent Labs  Lab 09/30/22 2314 09/30/22 2315 09/30/22 2333 10/01/22 0112 10/03/22 0306  WBC  --   --  7.9 6.5 6.4  NEUTROABS  --   --  7.3 5.8  --   HGB 12.6* 11.9* 12.0* 12.4* 11.7*  HCT 37.0* 35.0* 35.6* 37.2* 35.1*  MCV  --   --  91.8 92.1 90.5  PLT  --   --  92* 79* 143*   Basic Metabolic Panel: Recent Labs  Lab 09/30/22 2253 09/30/22 2254 09/30/22 2315 10/01/22 0112 10/02/22 0231 10/03/22 0306 10/04/22 5621  NA 135   < > 135 133* 132* 134* 133*  K 3.1*   < > 3.3* 3.0* 3.5 3.6 4.2  CL 100   < > 99 101 103 102 103  CO2 24  --   --  22 24 23 23   GLUCOSE 121*   < > 114* 115* 118* 113* 92  BUN 11   < > 13 11 15 12 9   CREATININE 0.84   < > 0.60* 0.79 0.72 0.74 1.26*  CALCIUM 8.3*  --   --  7.8* 8.5* 8.3* 8.0*  MG  --   --   --  1.2* 1.9  --   --    < > =  values in this interval not displayed.   GFR: Estimated Creatinine Clearance: 95.7 mL/min (A) (by C-G formula based on SCr of 1.26 mg/dL (H)). Liver Function Tests: Recent Labs  Lab 09/30/22 2253 10/01/22 0112 10/02/22 0231  AST 49* 41 27  ALT 44 40 34  ALKPHOS 72 66 77  BILITOT 2.2* 1.9* 1.3*  PROT 5.7* 5.1* 5.8*  ALBUMIN 2.1* 2.0* 2.1*   No results for input(s): "LIPASE", "AMYLASE" in the last 168 hours. Recent Labs  Lab 10/01/22 0115  AMMONIA 77*   Coagulation Profile: Recent Labs  Lab 09/30/22 2253  INR 1.5*   Cardiac Enzymes: No results for input(s): "CKTOTAL", "CKMB", "CKMBINDEX", "TROPONINI" in the last 168 hours. BNP (last 3 results) No results for input(s): "PROBNP" in the last 8760 hours. HbA1C: No results for input(s): "HGBA1C" in the last 72 hours. CBG: No results for input(s): "GLUCAP" in the last 168 hours. Lipid Profile: No results for input(s): "CHOL", "HDL", "LDLCALC", "TRIG", "CHOLHDL", "LDLDIRECT" in the last 72 hours. Thyroid Function Tests: No results for input(s): "TSH", "T4TOTAL", "FREET4", "T3FREE", "THYROIDAB" in the last 72 hours.  Anemia Panel: No results for input(s): "VITAMINB12", "FOLATE", "FERRITIN", "TIBC", "IRON", "RETICCTPCT" in the last 72 hours. Urine analysis:    Component Value Date/Time   COLORURINE AMBER (A) 10/01/2022 0142   APPEARANCEUR CLOUDY (A) 10/01/2022 0142   LABSPEC 1.025 10/01/2022 0142   PHURINE 5.0 10/01/2022 0142   GLUCOSEU NEGATIVE 10/01/2022 0142   HGBUR SMALL (A) 10/01/2022 0142   BILIRUBINUR NEGATIVE 10/01/2022 0142   KETONESUR NEGATIVE 10/01/2022 0142   PROTEINUR 30 (A) 10/01/2022 0142   NITRITE NEGATIVE 10/01/2022 0142   LEUKOCYTESUR LARGE (A) 10/01/2022 0142   Sepsis Labs: @LABRCNTIP (procalcitonin:4,lacticidven:4)  ) Recent Results (from the past 240 hour(s))  Blood Culture (routine x 2)     Status: Abnormal   Collection Time: 09/30/22 10:46 PM   Specimen: BLOOD  Result Value Ref Range  Status   Specimen Description BLOOD LEFT ANTECUBITAL  Final   Special Requests   Final    BOTTLES DRAWN AEROBIC AND ANAEROBIC Blood Culture adequate volume   Culture  Setup Time   Final    GRAM POSITIVE COCCI IN CHAINS AEROBIC BOTTLE ONLY CRITICAL RESULT CALLED TO, READ BACK BY AND VERIFIED WITH: PHARMD JESSICA MILLEN ON 10/01/22 @ 1532 BY DRT    Culture (A)  Final    STREPTOCOCCUS PYOGENES HEALTH DEPARTMENT NOTIFIED Performed at [facility], [address], [place], [place] [address]    Report Status 10/03/2022 FINAL  Final   Organism ID, Bacteria STREPTOCOCCUS PYOGENES  Final      Susceptibility   Streptococcus pyogenes - MIC*    PENICILLIN <=0.06 SENSITIVE Sensitive     CEFTRIAXONE <=0.12 SENSITIVE Sensitive     ERYTHROMYCIN <=0.12 SENSITIVE  Sensitive     LEVOFLOXACIN 1 SENSITIVE Sensitive     VANCOMYCIN 0.5 SENSITIVE Sensitive     * STREPTOCOCCUS PYOGENES  Blood Culture ID Panel (Reflexed)     Status: Abnormal   Collection Time: 09/30/22 10:46 PM  Result Value Ref Range Status   Enterococcus faecalis NOT DETECTED NOT DETECTED Final   Enterococcus Faecium NOT DETECTED NOT DETECTED Final   Listeria monocytogenes NOT DETECTED NOT DETECTED Final   Staphylococcus species NOT DETECTED NOT DETECTED Final   Staphylococcus aureus (BCID) NOT DETECTED NOT DETECTED Final   Staphylococcus epidermidis NOT DETECTED NOT DETECTED Final   Staphylococcus lugdunensis NOT DETECTED NOT DETECTED Final   Streptococcus species DETECTED (A) NOT DETECTED Final    Comment: CRITICAL RESULT CALLED TO, READ BACK BY AND VERIFIED WITH: PHARMD JESSICA MILLEN ON 10/01/22 @ 1532 BY DRT    Streptococcus agalactiae NOT DETECTED NOT DETECTED Final   Streptococcus pneumoniae NOT DETECTED NOT DETECTED Final   Streptococcus pyogenes DETECTED (A) NOT DETECTED Final    Comment: CRITICAL RESULT CALLED TO, READ BACK BY AND VERIFIED WITH: PHARMD JESSICA MILLEN ON 10/01/22 @ 1532 BY DRT    A.calcoaceticus-baumannii  NOT DETECTED NOT DETECTED Final   Bacteroides fragilis NOT DETECTED NOT DETECTED Final   Enterobacterales NOT DETECTED NOT DETECTED Final   Enterobacter cloacae complex NOT DETECTED NOT DETECTED Final   Escherichia coli NOT DETECTED NOT DETECTED Final   Klebsiella aerogenes NOT DETECTED NOT DETECTED Final   Klebsiella oxytoca NOT DETECTED NOT DETECTED Final   Klebsiella pneumoniae NOT DETECTED NOT DETECTED Final   Proteus species NOT DETECTED NOT DETECTED Final   Salmonella species NOT DETECTED NOT DETECTED Final   Serratia marcescens NOT DETECTED NOT DETECTED Final   Haemophilus influenzae NOT DETECTED NOT DETECTED Final   Neisseria meningitidis NOT DETECTED NOT DETECTED Final   Pseudomonas aeruginosa NOT DETECTED NOT DETECTED Final   Stenotrophomonas maltophilia NOT DETECTED NOT DETECTED Final   Candida albicans NOT DETECTED NOT DETECTED Final   Candida auris NOT DETECTED NOT DETECTED Final   Candida glabrata NOT DETECTED NOT DETECTED Final   Candida krusei NOT DETECTED NOT DETECTED Final   Candida parapsilosis NOT DETECTED NOT DETECTED Final   Candida tropicalis NOT DETECTED NOT DETECTED Final   Cryptococcus neoformans/gattii NOT DETECTED NOT DETECTED Final    Comment: Performed at [facility], [address], [place], [place] [address]  Blood Culture (routine x 2)     Status: None (Preliminary result)   Collection Time: 09/30/22 10:53 PM   Specimen: BLOOD  Result Value Ref Range Status   Specimen Description BLOOD RIGHT ANTECUBITAL  Final   Special Requests   Final    BOTTLES DRAWN AEROBIC AND ANAEROBIC Blood Culture adequate volume   Culture   Final    NO GROWTH 4 DAYS Performed at [facility], [address] [place], [place] [address]    Report Status PENDING  Incomplete  Resp panel by RT-PCR (RSV, Flu A&B, Covid) Anterior Nasal Swab     Status: None   Collection Time: 09/30/22 10:56 PM   Specimen: Anterior Nasal Swab  Result Value Ref Range Status   SARS  Coronavirus 2 by RT PCR NEGATIVE NEGATIVE Final   Influenza A by PCR NEGATIVE NEGATIVE Final   Influenza B by PCR NEGATIVE NEGATIVE Final    Comment: (NOTE) The Xpert Xpress SARS-CoV-2/FLU/RSV plus assay is intended as an aid in the diagnosis of influenza from Nasopharyngeal swab specimens and should not  be used as a sole basis for treatment. Nasal washings and aspirates are unacceptable for Xpert Xpress SARS-CoV-2/FLU/RSV testing.  Fact Sheet for Patients: BloggerCourse.com  Fact Sheet for Healthcare Providers: SeriousBroker.it  This test is not yet approved or cleared by the [place] FDA and has been authorized for detection and/or diagnosis of SARS-CoV-2 by FDA under an Emergency Use Authorization (EUA). This EUA will remain in effect (meaning this test can be used) for the duration of the COVID-19 declaration under Section 564(b)(1) of the Act, 21 U.S.C. section 360bbb-3(b)(1), unless the authorization is terminated or revoked.     Resp Syncytial Virus by PCR NEGATIVE NEGATIVE Final    Comment: (NOTE) Fact Sheet for Patients: BloggerCourse.com  Fact Sheet for Healthcare Providers: SeriousBroker.it  This test is not yet approved or cleared by the [place] FDA and has been authorized for detection and/or diagnosis of SARS-CoV-2 by FDA under an Emergency Use Authorization (EUA). This EUA will remain in effect (meaning this test can be used) for the duration of the COVID-19 declaration under Section 564(b)(1) of the Act, 21 U.S.C. section 360bbb-3(b)(1), unless the authorization is terminated or revoked.  Performed at [facility], [address], [place], [place] [address]   Urine Culture     Status: None   Collection Time: 10/01/22  1:42 AM   Specimen: Urine, Random  Result Value Ref Range Status   Specimen Description URINE, RANDOM  Final   Special  Requests NONE Reflexed from [id number]  Final   Culture   Final    NO GROWTH Performed at [facility], [address] [place], [place] [address]    Report Status 10/02/2022 FINAL  Final  Culture, blood (Routine X 2) w Reflex to ID Panel     Status: None (Preliminary result)   Collection Time: 10/02/22 12:15 PM   Specimen: BLOOD RIGHT FOREARM  Result Value Ref Range Status   Specimen Description BLOOD RIGHT FOREARM  Final   Special Requests   Final    BOTTLES DRAWN AEROBIC ONLY Blood Culture adequate volume   Culture   Final    NO GROWTH 2 DAYS Performed at [facility], [address], [place], [place] [address]    Report Status PENDING  Incomplete  Culture, blood (Routine X 2) w Reflex to ID Panel     Status: None (Preliminary result)   Collection Time: 10/02/22 12:15 PM   Specimen: BLOOD LEFT HAND  Result Value Ref Range Status   Specimen Description BLOOD LEFT HAND  Final   Special Requests   Final    BOTTLES DRAWN AEROBIC AND ANAEROBIC Blood Culture adequate volume   Culture   Final    NO GROWTH 2 DAYS Performed at [facility], [address] [place], [place] [address]    Report Status PENDING  Incomplete     Radiology Studies: No results found.   Scheduled Meds:  furosemide  40 mg Oral Daily   lactulose  20 g Oral BID   metoprolol tartrate  25 mg Oral BID   spironolactone  25 mg Oral Daily   Continuous Infusions:  linezolid (ZYVOX) IV     penicillin G potassium 12 Million Units in dextrose 5 % 500 mL CONTINUOUS infusion 12 Million Units (10/04/22 1041)     LOS: 3 days    Time spent:    Zannie Cove, MD [facility]   10/04/2022, 12:04 PM

## 2022-10-04 NOTE — Consult Note (Signed)
Consult Note  Jonathan Ibarra [facility] May 10, 1965  [id number].    Requesting MD: Zannie Cove, MD Chief Complaint/Reason for Consult: right thigh cellulitis  HPI:  Patient is a 57 year old male with PMH cirrhosis, hep C, s/p TIPS, polysubstance abuse who presented to the ED with weakness and lethargy 09/30/22. EMS called by patient's roommate. He reportedly had not seen a provider in several years. Last in our system in 2019 and had upper extremity cellulitis which was I&D by ED and grew out MSSA. Was seen at [facility] in 2022 and was incarcerated at that time. In the ED here he was febrile to 102, tachycardic with an elevated lactic. Ammonia was also elevated and he was noted to have erythema and tenderness of right lateral thigh. He was admitted to medicine service and started on broad spectrum antibiotics. Blood cultures grew strep pyogenes and ID consulted, CT right femur 8/3 did not show any abscess. Patient states this thigh pain has been present for the last 2 weeks.  The erythema has progressively worsened.  He states since this admission his knee has started to hurt more and is becoming somewhat red.  We are asked to evaluate for further recommendations.  ROS: Negative other than HPI  History reviewed. No pertinent family history.  Past Medical History:  Diagnosis Date   Alcohol abuse    Cirrhosis of liver (HCC)    Hypertension     History reviewed. No pertinent surgical history.  Social History:  reports that he has been smoking. He has never used smokeless tobacco. He reports current drug use. Drugs: Cocaine and Marijuana. He reports that he does not drink alcohol.  Allergies:  Allergies  Allergen Reactions   Tylenol [Acetaminophen] Other (See Comments)    "Not good for liver"    No medications prior to admission.    Blood pressure 116/73, pulse 83, temperature 97.6 F (36.4 C), temperature source Oral, resp. rate 18, height 6\' 1"  (1.854 m), weight 135.5 kg,  SpO2 100%. Physical Exam:  General: WD, male who is laying in bed in NAD, some disheveled appearing HEENT: head is normocephalic, atraumatic.  Sclera are noninjected.  PERRL.  Ears and nose without any masses or lesions.  Mouth is pink and moist, dentition is poor Heart: regular, rate, and rhythm.  Normal s1,s2. No obvious murmurs, gallops, or rubs noted.  Palpable radial and pedal pulses bilaterally Lungs: CTAB, no wheezes, rhonchi, or rales noted.  Respiratory effort nonlabored MS: BUE with no deficits.  Right thigh with extensive edema involving the lateral aspect with cellulitis present.  Several tiny pustules noted, but no abscess, fluctuance, crepitance, etc noted on physical exam.  See photo.  Right knee with more edema than left and some faint erythema starting.  This knee is warm to touch in comparison to the left.  Skin is intact with no necrosis or sloughing.  No vesicles or blistering. Knee with faint erythema and edema   Right thigh cellulitis  Up close of right thigh cellulitis that shows tiny little pustules Psych: A&Ox3 with an appropriate affect.   Results for orders placed or performed during the hospital encounter of 09/30/22 (from the past 48 hour(s))  HCV RNA quant     Status: None   Collection Time: 10/03/22  3:06 AM  Result Value Ref Range   HCV Quantitative HCV Not Detected >50 IU/mL   Test Information Comment     Comment: (NOTE) The quantitative range of this assay is  15 IU/mL to 100 million IU/mL. Performed At: [facility] [address] [place], [place] [id number] Jolene Schimke MD QI:[id number]   Hepatitis B surface antigen     Status: None   Collection Time: 10/03/22  3:06 AM  Result Value Ref Range   Hepatitis B Surface Ag NON REACTIVE NON REACTIVE    Comment: Performed at [facility], [address] [place], [place] [address]  Hepatitis B core antibody, total     Status: None   Collection Time: 10/03/22  3:06 AM  Result Value Ref  Range   [name] Core Total Ab NON REACTIVE NON REACTIVE    Comment: Performed at [facility], [address], [place], [place] [address]  CBC     Status: Abnormal   Collection Time: 10/03/22  3:06 AM  Result Value Ref Range   WBC 6.4 4.0 - 10.5 K/uL   RBC 3.88 (L) 4.22 - 5.81 MIL/uL   Hemoglobin 11.7 (L) 13.0 - 17.0 g/dL   HCT 18.8 (L) 41.6 - 60.6 %   MCV 90.5 80.0 - 100.0 fL   MCH 30.2 26.0 - 34.0 pg   MCHC 33.3 30.0 - 36.0 g/dL   RDW 30.1 60.1 - 09.3 %   Platelets 143 (L) 150 - 400 K/uL   nRBC 0.0 0.0 - 0.2 %    Comment: Performed at [facility], [address], [place], [place] [address]  Basic metabolic panel     Status: Abnormal   Collection Time: 10/03/22  3:06 AM  Result Value Ref Range   Sodium 134 (L) 135 - 145 mmol/L   Potassium 3.6 3.5 - 5.1 mmol/L   Chloride 102 98 - 111 mmol/L   CO2 23 22 - 32 mmol/L   Glucose, Bld 113 (H) 70 - 99 mg/dL    Comment: Glucose reference range applies only to samples taken after fasting for at least 8 hours.   BUN 12 6 - 20 mg/dL   Creatinine, Ser 3.22 0.61 - 1.24 mg/dL   Calcium 8.3 (L) 8.9 - 10.3 mg/dL   GFR, Estimated >02 >54 mL/min    Comment: (NOTE) Calculated using the CKD-EPI Creatinine Equation (2021)    Anion gap 9 5 - 15    Comment: Performed at [facility] [place] [facility], [address], [place], [place] [address]  Basic metabolic panel     Status: Abnormal   Collection Time: 10/04/22  7:12 AM  Result Value Ref Range   Sodium 133 (L) 135 - 145 mmol/L   Potassium 4.2 3.5 - 5.1 mmol/L   Chloride 103 98 - 111 mmol/L   CO2 23 22 - 32 mmol/L   Glucose, Bld 92 70 - 99 mg/dL    Comment: Glucose reference range applies only to samples taken after fasting for at least 8 hours.   BUN 9 6 - 20 mg/dL   Creatinine, Ser 3.76 (H) 0.61 - 1.24 mg/dL   Calcium 8.0 (L) 8.9 - 10.3 mg/dL   GFR, Estimated >28 >31 mL/min    Comment: (NOTE) Calculated using the CKD-EPI Creatinine Equation (2021)    Anion gap 7 5 - 15    Comment:  Performed at [facility], [address], [place], [place] [address]   No results found.    Assessment/Plan Right thigh and knee cellulitis with strep pyogenes bacteremia  The patient has been seen, examined, chart, labs, vitals, and imaging all personally reviewed.  His CT scan from  2 days ago revealed some soft tissue edema but no definite abscess or collection that was drainable.  His WBC is normal, but this erythema and process seem to be worsening with intensity of his redness.  There is still no obviously drainable collection on physical exam.  His right knee if now starting to hurt, is swollen, and has faint erythema.  He still has full ROM and can walk (was walking upon my entrance) but concerning for a spreading soft tissue infection.  Because of this, we will rescan the patient from his pelvis to his knee to completely assess this leg.  Pending the findings, we may need to have ortho weigh in, etc.  I have discussed the case and the plan with Dr. Jomarie Longs.  Despite his Cr jumping to 1.26 today, his GFR is still >60 and we both feel it is safe to proceed with contrast administration as this will dramatically help imaging.  We will continue to closely monitor with you.  FEN: HH diet VTE: none ID: Pen G and linezolid   - per [facility] -  SVT Decompensated Cirrhosis with Hx of TIPS Hepatic encephalopathy  Polysubstance abuse Chronic thrombocytopenia - plts 143K   I reviewed Consultant ID notes, hospitalist notes, last 24 h vitals and pain scores, last 48 h intake and output, last 24 h labs and trends, and last 24 h imaging results.  Letha Cape, [facility] Surgery 10/04/2022, 1:14 PM Please see Amion for pager number during day hours 7:00am-4:30pm

## 2022-10-05 DIAGNOSIS — I4891 Unspecified atrial fibrillation: Secondary | ICD-10-CM | POA: Diagnosis not present

## 2022-10-05 LAB — HCV RNA QUANT: HCV Quantitative: NOT DETECTED [IU]/mL (ref >50)

## 2022-10-05 LAB — HEPATITIS B SURFACE ANTIBODY, QUANTITATIVE: Hep B S AB Quant (Post): <3.5 m[IU]/mL — ABNORMAL LOW

## 2022-10-05 MED ORDER — LINEZOLID 600 MG PO TABS
600.0000 mg | ORAL_TABLET | Freq: Two times a day (BID) | ORAL | Status: DC
  Administered 2022-10-05 – 2022-10-06 (×3): 600 mg via ORAL
  Filled 2022-10-05 (×4): qty 1

## 2022-10-05 NOTE — Progress Notes (Signed)
PROGRESS NOTE    Jonathan Ibarra  HKV:[id number] DOB: [birth year] DOA: 09/30/2022 PCP: Candi Leash, MD  56/M with history of liver cirrhosis secondary to hep C and alcoholism,sp TIPS, polysubstance abuse presented to the ED with weakness and lethargy, EMS was called by his roommate, patient was encephalopathic in the ER, very poor historian, has not seen a doctor in years. -In the ED he was febrile to 102, tachycardic with SVT, lactate of 3.2, sodium 135, creatinine 0.8, WBC 7.9, hemoglobin 12, platelet 92 K, UA with amber-colored urine, large leukocytes, negative nitrite with rare bacteria, ammonia level 77, CT abdomen pelvis with IV contrast noted bilateral lower lobe atelectasis, noted to have area of erythema and tenderness involving right upper lateral thigh  Subjective: -Continues to have pain and discomfort in his right lateral thigh  Assessment and Plan:  Strep pyogenes bacteremia Right thigh cellulitis -Blood cultures with strep bacteremia 2/2 -CT femur with nonspecific soft tissue swelling, no abscess, inguinal adenopathy -ID consult appreciated, now on penicillin G and 48 hours of linezolid for antitoxin effect, unfortunately appears slightly worse, repeat CT with mildly progressive diffuse subcutaneous edema -Repeat blood cultures are negative -Increase activity -General Surgery consult appreciated, hopefully this does progress to necrotizing fasciitis  Transient SVT -Weaned off Cardizem gtt., continue low-dose metoprolol -Stable in NSR now  Decompensated liver cirrhosis -Restart Lasix and Aldactone -History of TIPS  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, poorly compliant with diuretics and lactulose -Improved, continue lactulose -Ambulate, PT OT eval  Chronic thrombocytopenia -Secondary to liver disease  Hypokalemia -Repleted  Polysubstance abuse (HCC) -UDS positive for cannabis and amphetamines, counseled  S DOH, poor compliance  with medications, follow-up, polysubstance abuse -TOC consult appreciated  DVT prophylaxis: SCDs  Code Status: Full code Family Communication: None present Disposition Plan: Home likely 48 hours, pending improvement in his right thigh  Consultants:  Infectious disease, general surgery  Procedures:   Antimicrobials:    Objective: Vitals:   10/04/22 1248 10/04/22 2036 10/05/22 0446 10/05/22 0735  BP: 116/73 138/82 133/82 123/69  Pulse: 83 90 84 61  Resp: 18 18 18 20   Temp: 97.6 F (36.4 C) 97.7 F (36.5 C) 97.8 F (36.6 C) 97.6 F (36.4 C)  TempSrc: Oral Oral Oral Oral  SpO2:      Weight:   132.5 kg   Height:        Intake/Output Summary (Last 24 hours) at 10/05/2022 1229 Last data filed at 10/05/2022 0740 Gross per 24 hour  Intake 1449.82 ml  Output 300 ml  Net 1149.82 ml   Filed Weights   10/02/22 0246 10/04/22 0359 10/05/22 0446  Weight: 135.6 kg 135.5 kg 132.5 kg    Examination:  General exam: AAOx3, no distress HEENT: Positive JVD CVS: S1-S2, regular rhythm Abdomen: Soft, nontender, bowel sounds present Extremities: Right upper lateral thigh with large black area of erythema and tenderness  Skin: As above Psychiatry:  Mood & affect appropriate.     Data Reviewed:   CBC: Recent Labs  Lab 09/30/22 2314 09/30/22 2315 09/30/22 2333 10/01/22 0112 10/03/22 0306  WBC  --   --  7.9 6.5 6.4  NEUTROABS  --   --  7.3 5.8  --   HGB 12.6* 11.9* 12.0* 12.4* 11.7*  HCT 37.0* 35.0* 35.6* 37.2* 35.1*  MCV  --   --  91.8 92.1 90.5  PLT  --   --  92* 79* 143*   Basic Metabolic Panel: Recent Labs  Lab 10/01/22 0112 10/02/22  0231 10/03/22 0306 10/04/22 0712 10/05/22 0504  NA 133* 132* 134* 133* 132*  K 3.0* 3.5 3.6 4.2 4.1  CL 101 103 102 103 99  CO2 22 24 23 23 25   GLUCOSE 115* 118* 113* 92 98  BUN 11 15 12 9 8   CREATININE 0.79 0.72 0.74 1.26* 0.71  CALCIUM 7.8* 8.5* 8.3* 8.0* 8.4*  MG 1.2* 1.9  --   --   --    GFR: Estimated Creatinine  Clearance: 148.9 mL/min (by C-G formula based on SCr of 0.71 mg/dL). Liver Function Tests: Recent Labs  Lab 09/30/22 2253 10/01/22 0112 10/02/22 0231 10/05/22 0504  AST 49* 41 27 25  ALT 44 40 34 24  ALKPHOS 72 66 77 73  BILITOT 2.2* 1.9* 1.3* 1.1  PROT 5.7* 5.1* 5.8* 6.3*  ALBUMIN 2.1* 2.0* 2.1* 2.2*   No results for input(s): "LIPASE", "AMYLASE" in the last 168 hours. Recent Labs  Lab 10/01/22 0115  AMMONIA 77*   Coagulation Profile: Recent Labs  Lab 09/30/22 2253  INR 1.5*   Cardiac Enzymes: No results for input(s): "CKTOTAL", "CKMB", "CKMBINDEX", "TROPONINI" in the last 168 hours. BNP (last 3 results) No results for input(s): "PROBNP" in the last 8760 hours. HbA1C: No results for input(s): "HGBA1C" in the last 72 hours. CBG: No results for input(s): "GLUCAP" in the last 168 hours. Lipid Profile: No results for input(s): "CHOL", "HDL", "LDLCALC", "TRIG", "CHOLHDL", "LDLDIRECT" in the last 72 hours. Thyroid Function Tests: No results for input(s): "TSH", "T4TOTAL", "FREET4", "T3FREE", "THYROIDAB" in the last 72 hours.  Anemia Panel: No results for input(s): "VITAMINB12", "FOLATE", "FERRITIN", "TIBC", "IRON", "RETICCTPCT" in the last 72 hours. Urine analysis:    Component Value Date/Time   COLORURINE AMBER (A) 10/01/2022 0142   APPEARANCEUR CLOUDY (A) 10/01/2022 0142   LABSPEC 1.025 10/01/2022 0142   PHURINE 5.0 10/01/2022 0142   GLUCOSEU NEGATIVE 10/01/2022 0142   HGBUR SMALL (A) 10/01/2022 0142   BILIRUBINUR NEGATIVE 10/01/2022 0142   KETONESUR NEGATIVE 10/01/2022 0142   PROTEINUR 30 (A) 10/01/2022 0142   NITRITE NEGATIVE 10/01/2022 0142   LEUKOCYTESUR LARGE (A) 10/01/2022 0142   Sepsis Labs: @LABRCNTIP (procalcitonin:4,lacticidven:4)  ) Recent Results (from the past 240 hour(s))  Blood Culture (routine x 2)     Status: Abnormal   Collection Time: 09/30/22 10:46 PM   Specimen: BLOOD  Result Value Ref Range Status   Specimen Description BLOOD LEFT  ANTECUBITAL  Final   Special Requests   Final    BOTTLES DRAWN AEROBIC AND ANAEROBIC Blood Culture adequate volume   Culture  Setup Time   Final    GRAM POSITIVE COCCI IN CHAINS AEROBIC BOTTLE ONLY CRITICAL RESULT CALLED TO, READ BACK BY AND VERIFIED WITH: PHARMD JESSICA MILLEN ON 10/01/22 @ 1532 BY DRT    Culture (A)  Final    STREPTOCOCCUS PYOGENES HEALTH DEPARTMENT NOTIFIED Performed at [facility], [address] [place], [place] [address]    Report Status 10/03/2022 FINAL  Final   Organism ID, Bacteria STREPTOCOCCUS PYOGENES  Final      Susceptibility   Streptococcus pyogenes - MIC*    PENICILLIN <=0.06 SENSITIVE Sensitive     CEFTRIAXONE <=0.12 SENSITIVE Sensitive     ERYTHROMYCIN <=0.12 SENSITIVE Sensitive     LEVOFLOXACIN 1 SENSITIVE Sensitive     VANCOMYCIN 0.5 SENSITIVE Sensitive     * STREPTOCOCCUS PYOGENES  Blood Culture ID Panel (Reflexed)     Status: Abnormal   Collection Time: 09/30/22 10:46 PM  Result  Value Ref Range Status   Enterococcus faecalis NOT DETECTED NOT DETECTED Final   Enterococcus Faecium NOT DETECTED NOT DETECTED Final   Listeria monocytogenes NOT DETECTED NOT DETECTED Final   Staphylococcus species NOT DETECTED NOT DETECTED Final   Staphylococcus aureus (BCID) NOT DETECTED NOT DETECTED Final   Staphylococcus epidermidis NOT DETECTED NOT DETECTED Final   Staphylococcus lugdunensis NOT DETECTED NOT DETECTED Final   Streptococcus species DETECTED (A) NOT DETECTED Final    Comment: CRITICAL RESULT CALLED TO, READ BACK BY AND VERIFIED WITH: PHARMD JESSICA MILLEN ON 10/01/22 @ 1532 BY DRT    Streptococcus agalactiae NOT DETECTED NOT DETECTED Final   Streptococcus pneumoniae NOT DETECTED NOT DETECTED Final   Streptococcus pyogenes DETECTED (A) NOT DETECTED Final    Comment: CRITICAL RESULT CALLED TO, READ BACK BY AND VERIFIED WITH: PHARMD JESSICA MILLEN ON 10/01/22 @ 1532 BY DRT    A.calcoaceticus-baumannii NOT DETECTED NOT DETECTED Final    Bacteroides fragilis NOT DETECTED NOT DETECTED Final   Enterobacterales NOT DETECTED NOT DETECTED Final   Enterobacter cloacae complex NOT DETECTED NOT DETECTED Final   Escherichia coli NOT DETECTED NOT DETECTED Final   Klebsiella aerogenes NOT DETECTED NOT DETECTED Final   Klebsiella oxytoca NOT DETECTED NOT DETECTED Final   Klebsiella pneumoniae NOT DETECTED NOT DETECTED Final   Proteus species NOT DETECTED NOT DETECTED Final   Salmonella species NOT DETECTED NOT DETECTED Final   Serratia marcescens NOT DETECTED NOT DETECTED Final   Haemophilus influenzae NOT DETECTED NOT DETECTED Final   Neisseria meningitidis NOT DETECTED NOT DETECTED Final   Pseudomonas aeruginosa NOT DETECTED NOT DETECTED Final   Stenotrophomonas maltophilia NOT DETECTED NOT DETECTED Final   Candida albicans NOT DETECTED NOT DETECTED Final   Candida auris NOT DETECTED NOT DETECTED Final   Candida glabrata NOT DETECTED NOT DETECTED Final   Candida krusei NOT DETECTED NOT DETECTED Final   Candida parapsilosis NOT DETECTED NOT DETECTED Final   Candida tropicalis NOT DETECTED NOT DETECTED Final   Cryptococcus neoformans/gattii NOT DETECTED NOT DETECTED Final    Comment: Performed at [facility], [address] [place], [place] [address]  Blood Culture (routine x 2)     Status: None   Collection Time: 09/30/22 10:53 PM   Specimen: BLOOD  Result Value Ref Range Status   Specimen Description BLOOD RIGHT ANTECUBITAL  Final   Special Requests   Final    BOTTLES DRAWN AEROBIC AND ANAEROBIC Blood Culture adequate volume   Culture   Final    NO GROWTH 5 DAYS Performed at [facility], [address] [place], [place] [address]    Report Status 10/05/2022 FINAL  Final  Resp panel by RT-PCR (RSV, Flu A&B, Covid) Anterior Nasal Swab     Status: None   Collection Time: 09/30/22 10:56 PM   Specimen: Anterior Nasal Swab  Result Value Ref Range Status   SARS Coronavirus 2 by RT PCR NEGATIVE NEGATIVE Final    Influenza A by PCR NEGATIVE NEGATIVE Final   Influenza B by PCR NEGATIVE NEGATIVE Final    Comment: (NOTE) The Xpert Xpress SARS-CoV-2/FLU/RSV plus assay is intended as an aid in the diagnosis of influenza from Nasopharyngeal swab specimens and should not be used as a sole basis for treatment. Nasal washings and aspirates are unacceptable for Xpert Xpress SARS-CoV-2/FLU/RSV testing.  Fact Sheet for Patients: BloggerCourse.com  Fact Sheet for Healthcare Providers: SeriousBroker.it  This test is not yet approved or cleared by the [place] FDA  and has been authorized for detection and/or diagnosis of SARS-CoV-2 by FDA under an Emergency Use Authorization (EUA). This EUA will remain in effect (meaning this test can be used) for the duration of the COVID-19 declaration under Section 564(b)(1) of the Act, 21 U.S.C. section 360bbb-3(b)(1), unless the authorization is terminated or revoked.     Resp Syncytial Virus by PCR NEGATIVE NEGATIVE Final    Comment: (NOTE) Fact Sheet for Patients: BloggerCourse.com  Fact Sheet for Healthcare Providers: SeriousBroker.it  This test is not yet approved or cleared by the [place] FDA and has been authorized for detection and/or diagnosis of SARS-CoV-2 by FDA under an Emergency Use Authorization (EUA). This EUA will remain in effect (meaning this test can be used) for the duration of the COVID-19 declaration under Section 564(b)(1) of the Act, 21 U.S.C. section 360bbb-3(b)(1), unless the authorization is terminated or revoked.  Performed at [facility], [address] [place], [place] [address]   Urine Culture     Status: None   Collection Time: 10/01/22  1:42 AM   Specimen: Urine, Random  Result Value Ref Range Status   Specimen Description URINE, RANDOM  Final   Special Requests NONE Reflexed from [id number]  Final   Culture    Final    NO GROWTH Performed at [facility], [address] [place], [place] [address]    Report Status 10/02/2022 FINAL  Final  Culture, blood (Routine X 2) w Reflex to ID Panel     Status: None (Preliminary result)   Collection Time: 10/02/22 12:15 PM   Specimen: BLOOD RIGHT FOREARM  Result Value Ref Range Status   Specimen Description BLOOD RIGHT FOREARM  Final   Special Requests   Final    BOTTLES DRAWN AEROBIC ONLY Blood Culture adequate volume   Culture   Final    NO GROWTH 3 DAYS Performed at [facility], [address], [place], [place] [address]    Report Status PENDING  Incomplete  Culture, blood (Routine X 2) w Reflex to ID Panel     Status: None (Preliminary result)   Collection Time: 10/02/22 12:15 PM   Specimen: BLOOD LEFT HAND  Result Value Ref Range Status   Specimen Description BLOOD LEFT HAND  Final   Special Requests   Final    BOTTLES DRAWN AEROBIC AND ANAEROBIC Blood Culture adequate volume   Culture   Final    NO GROWTH 3 DAYS Performed at [facility], [address], [place], [place] [address]    Report Status PENDING  Incomplete     Radiology Studies: CT KNEE RIGHT W CONTRAST  Result Date: 10/04/2022 CLINICAL DATA:  Soft tissue infection suspected. EXAM: CT OF THE RIGHT KNEE WITH CONTRAST TECHNIQUE: Multidetector CT imaging was performed following the standard protocol during bolus administration of intravenous contrast. RADIATION DOSE REDUCTION: This exam was performed according to the departmental dose-optimization program which includes automated exposure control, adjustment of the mA and/or kV according to patient size and/or use of iterative reconstruction technique. CONTRAST:  75mL OMNIPAQUE IOHEXOL 350 MG/ML SOLN COMPARISON:  CT of the right thigh 10/01/2022. FINDINGS: Bones/Joint/Cartilage Right thigh findings are dictated separately. No evidence of acute fracture, dislocation or bone destruction. No significant knee joint  effusion or abnormal synovial enhancement. No erosive changes. Ligaments Suboptimally assessed by CT. Muscles and Tendons The muscles about the right knee appear normal, without focal atrophy, fluid collection or abnormal enhancement. The quadriceps and patellar tendons  are intact. Soft tissues Generalized subcutaneous edema throughout the distal thigh and proximal lower leg has progressed from the CT of 3 days prior. No focal fluid collection, foreign body or soft tissue emphysema. No acute vascular findings are identified. IMPRESSION: 1. Generalized subcutaneous edema throughout the distal thigh and proximal lower leg has progressed from the CT of 3 days prior. This is nonspecific, but could reflect cellulitis. 2. No focal fluid collection, foreign body or soft tissue emphysema. 3. No evidence of osteomyelitis or septic arthritis. Electronically Signed   By: Carey Bullocks M.D.   On: 10/04/2022 18:15   CT FEMUR RIGHT W CONTRAST  Result Date: 10/04/2022 CLINICAL DATA:  Soft tissue infection suspected. EXAM: CT OF THE LOWER RIGHT EXTREMITY WITH CONTRAST TECHNIQUE: Multidetector CT imaging of the right thigh was performed according to the standard protocol following intravenous contrast administration. RADIATION DOSE REDUCTION: This exam was performed according to the departmental dose-optimization program which includes automated exposure control, adjustment of the mA and/or kV according to patient size and/or use of iterative reconstruction technique. CONTRAST:  75mL OMNIPAQUE IOHEXOL 350 MG/ML SOLN COMPARISON:  Previous CT 10/01/2022. FINDINGS: Bones/Joint/Cartilage No evidence of acute fracture, dislocation or bone destruction. Mild right hip degenerative changes. No significant joint space narrowing at the right knee. No significant hip or knee joint effusion. Knee details dictated separately. Ligaments Suboptimally assessed by CT. Muscles and Tendons No focal muscular fluid collection or abnormal  enhancement identified. No significant tendon abnormalities identified. Soft tissues Multiple enlarged right inguinal lymph nodes are again noted, likely reactive. These measure up to 1.6 cm short axis and appear mildly progressive compared with the recent examination. Mildly progressive diffuse subcutaneous edema throughout the right thigh. No focal fluid collection, foreign body or soft tissue emphysema identified. No acute vascular findings are seen. IMPRESSION: 1. Mildly progressive diffuse subcutaneous edema throughout the right thigh without focal fluid collection, foreign body or soft tissue emphysema. Findings may reflect superficial soft tissue infection (cellulitis). 2. Mildly progressive right inguinal adenopathy, likely reactive. 3. No acute osseous findings. 4. Pelvic and knee findings dictated separately. Electronically Signed   By: Carey Bullocks M.D.   On: 10/04/2022 18:11   CT PELVIS W CONTRAST  Result Date: 10/04/2022 CLINICAL DATA:  Soft tissue infected suspected. EXAM: CT PELVIS WITH CONTRAST TECHNIQUE: Multidetector CT imaging of the pelvis was performed using the standard protocol following the bolus administration of intravenous contrast. RADIATION DOSE REDUCTION: This exam was performed according to the departmental dose-optimization program which includes automated exposure control, adjustment of the mA and/or kV according to patient size and/or use of iterative reconstruction technique. CONTRAST:  75mL OMNIPAQUE IOHEXOL 350 MG/ML SOLN COMPARISON:  None Available. FINDINGS: Urinary Tract:  Bladder is within normal limits. Bowel: No focal wall thickening or inflammation identified. No dilated bowel loops. Vascular/Lymphatic: There are enlarged right inguinal lymph nodes measuring up to 16 mm short axis. There are numerous prominent, but nonenlarged left inguinal lymph nodes. No significant vascular abnormality seen. Reproductive: Prostate gland is within normal limits. Other: There is no  ascites. There is diffuse mild body wall edema. There is a moderate-sized fat containing umbilical hernia. Musculoskeletal: No acute osseous findings. IMPRESSION: 1. Right inguinal lymphadenopathy of uncertain etiology. 2. Mild body wall edema. 3. Moderate-sized fat containing umbilical hernia. Electronically Signed   By: Darliss Cheney M.D.   On: 10/04/2022 18:05     Scheduled Meds:  furosemide  40 mg Oral Daily   lactulose  20 g Oral BID  linezolid  600 mg Oral Q12H   metoprolol tartrate  25 mg Oral BID   spironolactone  25 mg Oral Daily   Continuous Infusions:  penicillin G potassium 12 Million Units in dextrose 5 % 500 mL CONTINUOUS infusion 12 Million Units (10/05/22 0914)     LOS: 4 days    Time spent:    Zannie Cove, MD [facility]   10/05/2022, 12:29 PM

## 2022-10-05 NOTE — Progress Notes (Signed)
Mobility Specialist: Progress Note   10/05/22 1500  Mobility  Activity Ambulated with assistance in hallway  Level of Assistance Other (Comment) (IV Pole)  Assistive Device Front wheel walker  Distance Ambulated (ft) 400 ft  Activity Response Tolerated well  Mobility Referral Yes  $Mobility charge 1 Mobility  Mobility Specialist Start Time (ACUTE ONLY) 1540  Mobility Specialist Stop Time (ACUTE ONLY) 1545  Mobility Specialist Time Calculation (min) (ACUTE ONLY) 5 min    Received pt in bed having no complaints and agreeable to mobility. Pt was asymptomatic throughout ambulation and returned to room w/o fault. Left in bed w/ call bell in reach and all needs met.   Maurene Capes Mobility Specialist Please contact via SecureChat or Rehab office at [phone]

## 2022-10-05 NOTE — Progress Notes (Signed)
Subjective: Patient still with pain on his thigh today.  Not really any better.  ROS: See above, otherwise other systems negative  Objective: Vital signs in last 24 hours: Temp:  [97.6 F (36.4 C)-97.8 F (36.6 C)] 97.6 F (36.4 C) (08/06 0735) Pulse Rate:  [61-99] 61 (08/06 0735) Resp:  [18-20] 20 (08/06 0735) BP: (116-138)/(69-82) 123/69 (08/06 0735) SpO2:  [100 %] 100 % (08/05 1040) Weight:  [132.5 kg] 132.5 kg (08/06 0446) Last BM Date : 10/05/22  Intake/Output from previous day: 08/05 0701 - 08/06 0700 In: 1689.8 [P.O.:240; I.V.:159.4; IV Piggyback:1290.4] Out: 900 [Urine:900] Intake/Output this shift: Total I/O In: -  Out: 300 [Urine:300]  PE: Skin: Right thigh with extensive edema involving the lateral aspect with cellulitis present. Several tiny pustules noted, but no abscess, fluctuance, crepitance, etc noted on physical exam.  Right knee with more edema than left and some faint erythema starting. This knee is warm to touch in comparison to the left. Skin is intact with no necrosis or sloughing. No vesicles or blistering.   Lab Results:  Recent Labs    10/03/22 0306  WBC 6.4  HGB 11.7*  HCT 35.1*  PLT 143*   BMET Recent Labs    10/04/22 0712 10/05/22 0504  NA 133* 132*  K 4.2 4.1  CL 103 99  CO2 23 25  GLUCOSE 92 98  BUN 9 8  CREATININE 1.26* 0.71  CALCIUM 8.0* 8.4*   PT/INR No results for input(s): "LABPROT", "INR" in the last 72 hours. CMP     Component Value Date/Time   NA 132 (L) 10/05/2022 0504   K 4.1 10/05/2022 0504   CL 99 10/05/2022 0504   CO2 25 10/05/2022 0504   GLUCOSE 98 10/05/2022 0504   BUN 8 10/05/2022 0504   CREATININE 0.71 10/05/2022 0504   CALCIUM 8.4 (L) 10/05/2022 0504   PROT 6.3 (L) 10/05/2022 0504   ALBUMIN 2.2 (L) 10/05/2022 0504   AST 25 10/05/2022 0504   ALT 24 10/05/2022 0504   ALKPHOS 73 10/05/2022 0504   BILITOT 1.1 10/05/2022 0504   GFRNONAA >60 10/05/2022 0504   GFRAA >60 04/25/2017 0551    Lipase     Component Value Date/Time   LIPASE 109 (H) 01/25/2015 0215       Studies/Results: CT KNEE RIGHT W CONTRAST  Result Date: 10/04/2022 CLINICAL DATA:  Soft tissue infection suspected. EXAM: CT OF THE RIGHT KNEE WITH CONTRAST TECHNIQUE: Multidetector CT imaging was performed following the standard protocol during bolus administration of intravenous contrast. RADIATION DOSE REDUCTION: This exam was performed according to the departmental dose-optimization program which includes automated exposure control, adjustment of the mA and/or kV according to patient size and/or use of iterative reconstruction technique. CONTRAST:  75mL OMNIPAQUE IOHEXOL 350 MG/ML SOLN COMPARISON:  CT of the right thigh 10/01/2022. FINDINGS: Bones/Joint/Cartilage Right thigh findings are dictated separately. No evidence of acute fracture, dislocation or bone destruction. No significant knee joint effusion or abnormal synovial enhancement. No erosive changes. Ligaments Suboptimally assessed by CT. Muscles and Tendons The muscles about the right knee appear normal, without focal atrophy, fluid collection or abnormal enhancement. The quadriceps and patellar tendons are intact. Soft tissues Generalized subcutaneous edema throughout the distal thigh and proximal lower leg has progressed from the CT of 3 days prior. No focal fluid collection, foreign body or soft tissue emphysema. No acute vascular findings are identified. IMPRESSION: 1. Generalized subcutaneous edema throughout the distal thigh and proximal lower leg  has progressed from the CT of 3 days prior. This is nonspecific, but could reflect cellulitis. 2. No focal fluid collection, foreign body or soft tissue emphysema. 3. No evidence of osteomyelitis or septic arthritis. Electronically Signed   By: Carey Bullocks M.D.   On: 10/04/2022 18:15   CT FEMUR RIGHT W CONTRAST  Result Date: 10/04/2022 CLINICAL DATA:  Soft tissue infection suspected. EXAM: CT OF THE LOWER  RIGHT EXTREMITY WITH CONTRAST TECHNIQUE: Multidetector CT imaging of the right thigh was performed according to the standard protocol following intravenous contrast administration. RADIATION DOSE REDUCTION: This exam was performed according to the departmental dose-optimization program which includes automated exposure control, adjustment of the mA and/or kV according to patient size and/or use of iterative reconstruction technique. CONTRAST:  75mL OMNIPAQUE IOHEXOL 350 MG/ML SOLN COMPARISON:  Previous CT 10/01/2022. FINDINGS: Bones/Joint/Cartilage No evidence of acute fracture, dislocation or bone destruction. Mild right hip degenerative changes. No significant joint space narrowing at the right knee. No significant hip or knee joint effusion. Knee details dictated separately. Ligaments Suboptimally assessed by CT. Muscles and Tendons No focal muscular fluid collection or abnormal enhancement identified. No significant tendon abnormalities identified. Soft tissues Multiple enlarged right inguinal lymph nodes are again noted, likely reactive. These measure up to 1.6 cm short axis and appear mildly progressive compared with the recent examination. Mildly progressive diffuse subcutaneous edema throughout the right thigh. No focal fluid collection, foreign body or soft tissue emphysema identified. No acute vascular findings are seen. IMPRESSION: 1. Mildly progressive diffuse subcutaneous edema throughout the right thigh without focal fluid collection, foreign body or soft tissue emphysema. Findings may reflect superficial soft tissue infection (cellulitis). 2. Mildly progressive right inguinal adenopathy, likely reactive. 3. No acute osseous findings. 4. Pelvic and knee findings dictated separately. Electronically Signed   By: Carey Bullocks M.D.   On: 10/04/2022 18:11   CT PELVIS W CONTRAST  Result Date: 10/04/2022 CLINICAL DATA:  Soft tissue infected suspected. EXAM: CT PELVIS WITH CONTRAST TECHNIQUE:  Multidetector CT imaging of the pelvis was performed using the standard protocol following the bolus administration of intravenous contrast. RADIATION DOSE REDUCTION: This exam was performed according to the departmental dose-optimization program which includes automated exposure control, adjustment of the mA and/or kV according to patient size and/or use of iterative reconstruction technique. CONTRAST:  75mL OMNIPAQUE IOHEXOL 350 MG/ML SOLN COMPARISON:  None Available. FINDINGS: Urinary Tract:  Bladder is within normal limits. Bowel: No focal wall thickening or inflammation identified. No dilated bowel loops. Vascular/Lymphatic: There are enlarged right inguinal lymph nodes measuring up to 16 mm short axis. There are numerous prominent, but nonenlarged left inguinal lymph nodes. No significant vascular abnormality seen. Reproductive: Prostate gland is within normal limits. Other: There is no ascites. There is diffuse mild body wall edema. There is a moderate-sized fat containing umbilical hernia. Musculoskeletal: No acute osseous findings. IMPRESSION: 1. Right inguinal lymphadenopathy of uncertain etiology. 2. Mild body wall edema. 3. Moderate-sized fat containing umbilical hernia. Electronically Signed   By: Darliss Cheney M.D.   On: 10/04/2022 18:05    Anti-infectives: Anti-infectives (From admission, onward)    Start     Dose/Rate Route Frequency Ordered Stop   10/05/22 1000  linezolid (ZYVOX) tablet 600 mg        600 mg Oral Every 12 hours 10/05/22 0904     10/04/22 2200  linezolid (ZYVOX) IVPB 600 mg  Status:  Discontinued        600 mg 300 mL/hr over 60  Minutes Intravenous Every 12 hours 10/04/22 1053 10/05/22 0904   10/02/22 0100  vancomycin (VANCOREADY) IVPB 1250 mg/250 mL  Status:  Discontinued        1,250 mg 166.7 mL/hr over 90 Minutes Intravenous Every 12 hours 10/01/22 1304 10/01/22 1629   10/01/22 2200  penicillin G potassium 12 Million Units in dextrose 5 % 500 mL CONTINUOUS infusion         12 Million Units 41.7 mL/hr over 12 Hours Intravenous Every 12 hours 10/01/22 1629     10/01/22 2200  linezolid (ZYVOX) IVPB 600 mg  Status:  Discontinued        600 mg 300 mL/hr over 60 Minutes Intravenous Every 12 hours 10/01/22 1629 10/04/22 1047   10/01/22 1300  ceFEPIme (MAXIPIME) 2 g in sodium chloride 0.9 % 100 mL IVPB  Status:  Discontinued        2 g 200 mL/hr over 30 Minutes Intravenous Every 8 hours 10/01/22 1214 10/01/22 1629   10/01/22 1300  vancomycin (VANCOREADY) IVPB 2000 mg/400 mL        2,000 mg 200 mL/hr over 120 Minutes Intravenous  Once 10/01/22 1214 10/01/22 1631   10/01/22 1015  cephALEXin (KEFLEX) capsule 500 mg  Status:  Discontinued        500 mg Oral Every 12 hours 10/01/22 0927 10/01/22 1114   09/30/22 2315  vancomycin (VANCOREADY) IVPB 2000 mg/400 mL        2,000 mg 200 mL/hr over 120 Minutes Intravenous  Once 09/30/22 2303 10/01/22 0206   09/30/22 2300  ceFEPIme (MAXIPIME) 2 g in sodium chloride 0.9 % 100 mL IVPB        2 g 200 mL/hr over 30 Minutes Intravenous  Once 09/30/22 2255 09/30/22 2349   09/30/22 2300  metroNIDAZOLE (FLAGYL) IVPB 500 mg        500 mg 100 mL/hr over 60 Minutes Intravenous  Once 09/30/22 2255 10/01/22 0132   09/30/22 2300  vancomycin (VANCOCIN) IVPB 1000 mg/200 mL premix  Status:  Discontinued        1,000 mg 200 mL/hr over 60 Minutes Intravenous  Once 09/30/22 2255 09/30/22 2303        Assessment/Plan Right thigh and knee cellulitis with strep pyogenes bacteremia  -CT scans all reviewed.  Still with subcutaneous edema but no drainable collections at this time.  This does extend from the thigh past the knee -continue abx therapy at this time -we will continue to follow for now to assure no changes in the next 24 hrs or so that will result in needing OR.  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   LOS: 4 days    Letha Cape , [facility] Surgery 10/05/2022, 9:30 AM Please see Amion for pager number during day hours 7:00am-4:30pm or 7:00am -11:30am on weekends

## 2022-10-06 DIAGNOSIS — R7881 Bacteremia: Secondary | ICD-10-CM | POA: Diagnosis not present

## 2022-10-06 DIAGNOSIS — I4891 Unspecified atrial fibrillation: Secondary | ICD-10-CM | POA: Diagnosis not present

## 2022-10-06 DIAGNOSIS — L03115 Cellulitis of right lower limb: Secondary | ICD-10-CM | POA: Diagnosis not present

## 2022-10-06 DIAGNOSIS — B955 Unspecified streptococcus as the cause of diseases classified elsewhere: Secondary | ICD-10-CM | POA: Diagnosis not present

## 2022-10-06 LAB — CBC
HCT: 39.1 % (ref 39.0–52.0)
Hemoglobin: 13.1 g/dL (ref 13.0–17.0)
MCH: 29.9 pg (ref 26.0–34.0)
MCHC: 33.5 g/dL (ref 30.0–36.0)
MCV: 89.3 fL (ref 80.0–100.0)
Platelets: 168 10*3/uL (ref 150–400)
RBC: 4.38 MIL/uL (ref 4.22–5.81)
RDW: 15.1 % (ref 11.5–15.5)
WBC: 4.4 10*3/uL (ref 4.0–10.5)
nRBC: 0 % (ref 0.0–0.2)

## 2022-10-06 NOTE — Progress Notes (Signed)
Physical Therapy Treatment Patient Details Name: Jonathan Ibarra MRN: [id number] DOB: 12/04/1965 Today's Date: 10/06/2022   History of Present Illness Pt is a 57 y/o male presenting 8/1 to the ED via EMS due to AMS, pt lying in bed for 2 days.  PMH  Alcohol abuse, [name], HTN, liver cirrhosis, polysubstance abuse.    PT Comments  Pt received in supine and agreeable to session. Pt making good progress towards functional mobility goals and is able to perform all tasks with up to supervision for safety. Pt is slightly impulsive with mobility, but demonstrates no overt LOB. Pt is able to tolerate increased gait distance with IV pole support. Pt requests to use the bathroom at the end of the session, RN notified. Pt continues to benefit from PT services to progress toward functional mobility goals.    If plan is discharge home, recommend the following: Other (comment) (prn help)   Can travel by private vehicle        Equipment Recommendations  None recommended by PT    Recommendations for Other Services       Precautions / Restrictions Precautions Precautions: Fall Restrictions Weight Bearing Restrictions: No     Mobility  Bed Mobility Overal bed mobility: Independent                  Transfers Overall transfer level: Modified independent Equipment used: None Transfers: Sit to/from Stand Sit to Stand: Modified independent (Device/Increase time)           General transfer comment: From low EOB    Ambulation/Gait Ambulation/Gait assistance: Supervision Gait Distance (Feet): 400 Feet Assistive device: IV Pole Gait Pattern/deviations: Step-through pattern       General Gait Details: Pt demonstrating mild instability, but no LOB.  Supervision for safety.       Balance Overall balance assessment: Mild deficits observed, not formally tested                                          Cognition Arousal: Alert Behavior During Therapy: WFL for  tasks assessed/performed Overall Cognitive Status: Within Functional Limits for tasks assessed                                 General Comments: Pt slightly impulsive with mobility        Exercises      General Comments        Pertinent Vitals/Pain Pain Assessment Pain Assessment: 0-10 Pain Score: 8  Pain Location: R thigh Pain Descriptors / Indicators: Discomfort, Aching Pain Intervention(s): Monitored during session     PT Goals (current goals can now be found in the care plan section) Acute Rehab PT Goals Patient Stated Goal: agreed, needed to be independent PT Goal Formulation: With patient Time For Goal Achievement: 10/15/22 Potential to Achieve Goals: Good Progress towards PT goals: Progressing toward goals    Frequency    Min 1X/week      PT Plan Current plan remains appropriate       AM-PAC PT "6 Clicks" Mobility   Outcome Measure  Help needed turning from your back to your side while in a flat bed without using bedrails?: None Help needed moving from lying on your back to sitting on the side of a flat bed without using bedrails?: None Help needed moving  to and from a bed to a chair (including a wheelchair)?: None Help needed standing up from a chair using your arms (e.g., wheelchair or bedside chair)?: None Help needed to walk in hospital room?: A Little Help needed climbing 3-5 steps with a railing? : A Little 6 Click Score: 22    End of Session   Activity Tolerance: Patient tolerated treatment well Patient left:  (In bathroom) Nurse Communication: Mobility status;Other (comment) (Pt in bathroom) PT Visit Diagnosis: Unsteadiness on feet (R26.81);Muscle weakness (generalized) (M62.81)     Time: 1610-9604 PT Time Calculation (min) (ACUTE ONLY): 8 min  Charges:    $Gait Training: 8-22 mins PT General Charges $$ ACUTE PT VISIT: 1 Visit                     [name], PTA Acute Rehabilitation Services Secure Chat  Preferred  [phone]    [name] 10/06/2022, 3:52 PM

## 2022-10-06 NOTE — Progress Notes (Signed)
Subjective: Patient still with pain on his thigh today. Ambulated yesterday.  Less pain in his right knee  Objective: Vital signs in last 24 hours: Temp:  [97.7 F (36.5 C)-97.9 F (36.6 C)] 97.7 F (36.5 C) (08/07 0315) Pulse Rate:  [79] 79 (08/07 0315) Resp:  [18] 18 (08/07 0315) BP: (106-117)/(66-78) 106/66 (08/07 0315) SpO2:  [95 %-97 %] 97 % (08/07 0315) Last BM Date : 10/05/22  Intake/Output from previous day: 08/06 0701 - 08/07 0700 In: 1840.8 [P.O.:840; IV Piggyback:1000.8] Out: 2750 [Urine:2750] Intake/Output this shift: No intake/output data recorded.  PE: Skin: Right thigh cellulitis less intense and edema has lessened.  There is no further erythema in his knee and is less warm to the touch.  No fluctuant or necrotic areas noted.  Lab Results:  No results for input(s): "WBC", "HGB", "HCT", "PLT" in the last 72 hours.  BMET Recent Labs    10/05/22 0504 10/06/22 0242  NA 132* 132*  K 4.1 4.2  CL 99 100  CO2 25 26  GLUCOSE 98 111*  BUN 8 8  CREATININE 0.71 0.60*  CALCIUM 8.4* 8.7*   PT/INR No results for input(s): "LABPROT", "INR" in the last 72 hours. CMP     Component Value Date/Time   NA 132 (L) 10/06/2022 0242   K 4.2 10/06/2022 0242   CL 100 10/06/2022 0242   CO2 26 10/06/2022 0242   GLUCOSE 111 (H) 10/06/2022 0242   BUN 8 10/06/2022 0242   CREATININE 0.60 (L) 10/06/2022 0242   CALCIUM 8.7 (L) 10/06/2022 0242   PROT 6.3 (L) 10/05/2022 0504   ALBUMIN 2.2 (L) 10/05/2022 0504   AST 25 10/05/2022 0504   ALT 24 10/05/2022 0504   ALKPHOS 73 10/05/2022 0504   BILITOT 1.1 10/05/2022 0504   GFRNONAA >60 10/06/2022 0242   GFRAA >60 04/25/2017 0551   Lipase     Component Value Date/Time   LIPASE 109 (H) 01/25/2015 0215       Studies/Results: CT KNEE RIGHT W CONTRAST  Result Date: 10/04/2022 CLINICAL DATA:  Soft tissue infection suspected. EXAM: CT OF THE RIGHT KNEE WITH CONTRAST TECHNIQUE: Multidetector CT imaging was performed  following the standard protocol during bolus administration of intravenous contrast. RADIATION DOSE REDUCTION: This exam was performed according to the departmental dose-optimization program which includes automated exposure control, adjustment of the mA and/or kV according to patient size and/or use of iterative reconstruction technique. CONTRAST:  75mL OMNIPAQUE IOHEXOL 350 MG/ML SOLN COMPARISON:  CT of the right thigh 10/01/2022. FINDINGS: Bones/Joint/Cartilage Right thigh findings are dictated separately. No evidence of acute fracture, dislocation or bone destruction. No significant knee joint effusion or abnormal synovial enhancement. No erosive changes. Ligaments Suboptimally assessed by CT. Muscles and Tendons The muscles about the right knee appear normal, without focal atrophy, fluid collection or abnormal enhancement. The quadriceps and patellar tendons are intact. Soft tissues Generalized subcutaneous edema throughout the distal thigh and proximal lower leg has progressed from the CT of 3 days prior. No focal fluid collection, foreign body or soft tissue emphysema. No acute vascular findings are identified. IMPRESSION: 1. Generalized subcutaneous edema throughout the distal thigh and proximal lower leg has progressed from the CT of 3 days prior. This is nonspecific, but could reflect cellulitis. 2. No focal fluid collection, foreign body or soft tissue emphysema. 3. No evidence of osteomyelitis or septic arthritis. Electronically Signed   By: Carey Bullocks M.D.   On: 10/04/2022 18:15   CT  FEMUR RIGHT W CONTRAST  Result Date: 10/04/2022 CLINICAL DATA:  Soft tissue infection suspected. EXAM: CT OF THE LOWER RIGHT EXTREMITY WITH CONTRAST TECHNIQUE: Multidetector CT imaging of the right thigh was performed according to the standard protocol following intravenous contrast administration. RADIATION DOSE REDUCTION: This exam was performed according to the departmental dose-optimization program which  includes automated exposure control, adjustment of the mA and/or kV according to patient size and/or use of iterative reconstruction technique. CONTRAST:  75mL OMNIPAQUE IOHEXOL 350 MG/ML SOLN COMPARISON:  Previous CT 10/01/2022. FINDINGS: Bones/Joint/Cartilage No evidence of acute fracture, dislocation or bone destruction. Mild right hip degenerative changes. No significant joint space narrowing at the right knee. No significant hip or knee joint effusion. Knee details dictated separately. Ligaments Suboptimally assessed by CT. Muscles and Tendons No focal muscular fluid collection or abnormal enhancement identified. No significant tendon abnormalities identified. Soft tissues Multiple enlarged right inguinal lymph nodes are again noted, likely reactive. These measure up to 1.6 cm short axis and appear mildly progressive compared with the recent examination. Mildly progressive diffuse subcutaneous edema throughout the right thigh. No focal fluid collection, foreign body or soft tissue emphysema identified. No acute vascular findings are seen. IMPRESSION: 1. Mildly progressive diffuse subcutaneous edema throughout the right thigh without focal fluid collection, foreign body or soft tissue emphysema. Findings may reflect superficial soft tissue infection (cellulitis). 2. Mildly progressive right inguinal adenopathy, likely reactive. 3. No acute osseous findings. 4. Pelvic and knee findings dictated separately. Electronically Signed   By: Carey Bullocks M.D.   On: 10/04/2022 18:11   CT PELVIS W CONTRAST  Result Date: 10/04/2022 CLINICAL DATA:  Soft tissue infected suspected. EXAM: CT PELVIS WITH CONTRAST TECHNIQUE: Multidetector CT imaging of the pelvis was performed using the standard protocol following the bolus administration of intravenous contrast. RADIATION DOSE REDUCTION: This exam was performed according to the departmental dose-optimization program which includes automated exposure control, adjustment of  the mA and/or kV according to patient size and/or use of iterative reconstruction technique. CONTRAST:  75mL OMNIPAQUE IOHEXOL 350 MG/ML SOLN COMPARISON:  None Available. FINDINGS: Urinary Tract:  Bladder is within normal limits. Bowel: No focal wall thickening or inflammation identified. No dilated bowel loops. Vascular/Lymphatic: There are enlarged right inguinal lymph nodes measuring up to 16 mm short axis. There are numerous prominent, but nonenlarged left inguinal lymph nodes. No significant vascular abnormality seen. Reproductive: Prostate gland is within normal limits. Other: There is no ascites. There is diffuse mild body wall edema. There is a moderate-sized fat containing umbilical hernia. Musculoskeletal: No acute osseous findings. IMPRESSION: 1. Right inguinal lymphadenopathy of uncertain etiology. 2. Mild body wall edema. 3. Moderate-sized fat containing umbilical hernia. Electronically Signed   By: Darliss Cheney M.D.   On: 10/04/2022 18:05    Anti-infectives: Anti-infectives (From admission, onward)    Start     Dose/Rate Route Frequency Ordered Stop   10/05/22 1000  linezolid (ZYVOX) tablet 600 mg        600 mg Oral Every 12 hours 10/05/22 0904     10/04/22 2200  linezolid (ZYVOX) IVPB 600 mg  Status:  Discontinued        600 mg 300 mL/hr over 60 Minutes Intravenous Every 12 hours 10/04/22 1053 10/05/22 0904   10/02/22 0100  vancomycin (VANCOREADY) IVPB 1250 mg/250 mL  Status:  Discontinued        1,250 mg 166.7 mL/hr over 90 Minutes Intravenous Every 12 hours 10/01/22 1304 10/01/22 1629   10/01/22 2200  penicillin  G potassium 12 Million Units in dextrose 5 % 500 mL CONTINUOUS infusion        12 Million Units 41.7 mL/hr over 12 Hours Intravenous Every 12 hours 10/01/22 1629     10/01/22 2200  linezolid (ZYVOX) IVPB 600 mg  Status:  Discontinued        600 mg 300 mL/hr over 60 Minutes Intravenous Every 12 hours 10/01/22 1629 10/04/22 1047   10/01/22 1300  ceFEPIme (MAXIPIME) 2 g in  sodium chloride 0.9 % 100 mL IVPB  Status:  Discontinued        2 g 200 mL/hr over 30 Minutes Intravenous Every 8 hours 10/01/22 1214 10/01/22 1629   10/01/22 1300  vancomycin (VANCOREADY) IVPB 2000 mg/400 mL        2,000 mg 200 mL/hr over 120 Minutes Intravenous  Once 10/01/22 1214 10/01/22 1631   10/01/22 1015  cephALEXin (KEFLEX) capsule 500 mg  Status:  Discontinued        500 mg Oral Every 12 hours 10/01/22 0927 10/01/22 1114   09/30/22 2315  vancomycin (VANCOREADY) IVPB 2000 mg/400 mL        2,000 mg 200 mL/hr over 120 Minutes Intravenous  Once 09/30/22 2303 10/01/22 0206   09/30/22 2300  ceFEPIme (MAXIPIME) 2 g in sodium chloride 0.9 % 100 mL IVPB        2 g 200 mL/hr over 30 Minutes Intravenous  Once 09/30/22 2255 09/30/22 2349   09/30/22 2300  metroNIDAZOLE (FLAGYL) IVPB 500 mg        500 mg 100 mL/hr over 60 Minutes Intravenous  Once 09/30/22 2255 10/01/22 0132   09/30/22 2300  vancomycin (VANCOCIN) IVPB 1000 mg/200 mL premix  Status:  Discontinued        1,000 mg 200 mL/hr over 60 Minutes Intravenous  Once 09/30/22 2255 09/30/22 2303        Assessment/Plan Right thigh and knee cellulitis with strep pyogenes bacteremia  -CT scans all reviewed.  No fluid collections -overall this area seems to be improving. -no role for surgical intervention at this time -cbc is pending today -d/w primary service at the bedside.  Will plan to sign off as he seems to be improving, but will be available if something changes.  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   LOS: 5 days    Letha Cape , [facility] Surgery 10/06/2022, 8:15 AM Please see Amion for pager number  during day hours 7:00am-4:30pm or 7:00am -11:30am on weekends

## 2022-10-06 NOTE — Progress Notes (Addendum)
[facility] Infectious Diseases Follow Up Note  Patient Identification: Patient Name: Jonathan Ibarra MRN: [id number] Admit Date: 09/30/2022 10:24 PM Age: 57 y.o.Today's Date: 10/06/2022  Reason for Visit: bacteremia/rt thigh cellulitis   Principal Problem:   Rapid atrial fibrillation (HCC) Active Problems:   Hepatic cirrhosis (HCC)   Polysubstance abuse (HCC)   Hypokalemia   S/P TIPS (transjugular intrahepatic portosystemic shunt)   SIRS without infection with organ dysfunction (HCC)  Current Antibiotics:  Penicillin G 8/2-c Linezolid 8/2-c Total days of antibiotics D 7   Lines/Hardware:  Interval Events: remains afebrile Labs remarkable for Cr improved to 0.6, WBC 4.4   Assessment 57 year old male with prior history of colic liver cirrhosis s/p TIPS, hypertension, polysubstance abuse, hepatitis C, seizure disorder, GERD brought to the ED on 8/1 with fever, chills altered mental status, nausea vomiting and diarrhea. Admitted with   # Streptococcal bacteremia 2/2  # Right thigh cellulitis, involving overlying skin rt knee  - CT rt femur 8/3 Nonspecific soft tissue swelling of the right thigh. No abscess.  - 8/5 CT pelvis, femur and rt knee: eneralized subcutaneous edema throughout the distal thigh and proximal lower leg has progressed from the CT of 3 days prior. This is nonspecific, but could reflect cellulitis. No focal fluid collection, foreign body or soft tissue emphysema. No evidence of osteomyelitis or septic arthritis. - 8/5 evaluated by surgery, nec fasc ruled out   # Chronic Thrombocytopenia - improving  # AKI - improved  # Alcoholic liver cirrhosis s/p TIPS - no signs of peritonitis on exam  # H/o HCV - 8/4 HCV RNA negative, Hepatitis B surface ag NR  Recommendations Continue penicillin G for now  Complete 5 days of linezolid for continued anti-toxin effect  If rt thigh cellulitis continues to improve then,  could switch to PO amoxicillin 1g po tid in next couple of days to complete course through 8/16 Monitor CBC and BMP  ID will so for now, please recall if needed   Rest of the management as per the primary team. Thank you for the consult. Please page with pertinent questions or concerns.  ______________________________________________________________________ Subjective patient seen and examined at the bedside.  Pain and tenderness in rt thigh better. Was walking out of the bathroom when I saw   Vitals BP 106/66 (BP Location: Right Arm)   Pulse 79   Temp 97.7 F (36.5 C) (Oral)   Resp 18   Ht 6\' 1"  (1.854 m)   Wt 132.5 kg   SpO2 97%   BMI 38.54 kg/m     Physical Exam Constitutional: adult male sitting in the bed and appears comfortable    Comments:   Cardiovascular:     Rate and Rhythm: Normal rate and regular rhythm.     Heart sounds:  Pulmonary:     Effort: Pulmonary effort is normal on room air     Comments:   Abdominal:     Palpations: Abdomen is soft.     Tenderness: Nondistended and nontender  Musculoskeletal:        General: Right lateral thigh with redness and tenderness including faint redness overlying the right knee - all improving. No crepitus or fluctuance. No signs of septic rt knee. No similar redness in other areas  Skin:    Comments:  Rt knee and thigh     Neurological:     General: Awake, alert and oriented, following commands  Psychiatric:        Mood and Affect: Mood normal.  Pertinent Microbiology Results for orders placed or performed during the hospital encounter of 09/30/22  Blood Culture (routine x 2)     Status: Abnormal   Collection Time: 09/30/22 10:46 PM   Specimen: BLOOD  Result Value Ref Range Status   Specimen Description BLOOD LEFT ANTECUBITAL  Final   Special Requests   Final    BOTTLES DRAWN AEROBIC AND ANAEROBIC Blood Culture adequate volume   Culture  Setup Time   Final    GRAM POSITIVE COCCI IN CHAINS AEROBIC  BOTTLE ONLY CRITICAL RESULT CALLED TO, READ BACK BY AND VERIFIED WITH: PHARMD JESSICA MILLEN ON 10/01/22 @ 1532 BY DRT    Culture (A)  Final    STREPTOCOCCUS PYOGENES HEALTH DEPARTMENT NOTIFIED Performed at [facility], [address], [place], [place] [address]    Report Status 10/03/2022 FINAL  Final   Organism ID, Bacteria STREPTOCOCCUS PYOGENES  Final      Susceptibility   Streptococcus pyogenes - MIC*    PENICILLIN <=0.06 SENSITIVE Sensitive     CEFTRIAXONE <=0.12 SENSITIVE Sensitive     ERYTHROMYCIN <=0.12 SENSITIVE Sensitive     LEVOFLOXACIN 1 SENSITIVE Sensitive     VANCOMYCIN 0.5 SENSITIVE Sensitive     * STREPTOCOCCUS PYOGENES  Blood Culture ID Panel (Reflexed)     Status: Abnormal   Collection Time: 09/30/22 10:46 PM  Result Value Ref Range Status   Enterococcus faecalis NOT DETECTED NOT DETECTED Final   Enterococcus Faecium NOT DETECTED NOT DETECTED Final   Listeria monocytogenes NOT DETECTED NOT DETECTED Final   Staphylococcus species NOT DETECTED NOT DETECTED Final   Staphylococcus aureus (BCID) NOT DETECTED NOT DETECTED Final   Staphylococcus epidermidis NOT DETECTED NOT DETECTED Final   Staphylococcus lugdunensis NOT DETECTED NOT DETECTED Final   Streptococcus species DETECTED (A) NOT DETECTED Final    Comment: CRITICAL RESULT CALLED TO, READ BACK BY AND VERIFIED WITH: PHARMD JESSICA MILLEN ON 10/01/22 @ 1532 BY DRT    Streptococcus agalactiae NOT DETECTED NOT DETECTED Final   Streptococcus pneumoniae NOT DETECTED NOT DETECTED Final   Streptococcus pyogenes DETECTED (A) NOT DETECTED Final    Comment: CRITICAL RESULT CALLED TO, READ BACK BY AND VERIFIED WITH: PHARMD JESSICA MILLEN ON 10/01/22 @ 1532 BY DRT    A.calcoaceticus-baumannii NOT DETECTED NOT DETECTED Final   Bacteroides fragilis NOT DETECTED NOT DETECTED Final   Enterobacterales NOT DETECTED NOT DETECTED Final   Enterobacter cloacae complex NOT DETECTED NOT DETECTED Final   Escherichia coli NOT  DETECTED NOT DETECTED Final   Klebsiella aerogenes NOT DETECTED NOT DETECTED Final   Klebsiella oxytoca NOT DETECTED NOT DETECTED Final   Klebsiella pneumoniae NOT DETECTED NOT DETECTED Final   Proteus species NOT DETECTED NOT DETECTED Final   Salmonella species NOT DETECTED NOT DETECTED Final   Serratia marcescens NOT DETECTED NOT DETECTED Final   Haemophilus influenzae NOT DETECTED NOT DETECTED Final   Neisseria meningitidis NOT DETECTED NOT DETECTED Final   Pseudomonas aeruginosa NOT DETECTED NOT DETECTED Final   Stenotrophomonas maltophilia NOT DETECTED NOT DETECTED Final   Candida albicans NOT DETECTED NOT DETECTED Final   Candida auris NOT DETECTED NOT DETECTED Final   Candida glabrata NOT DETECTED NOT DETECTED Final   Candida krusei NOT DETECTED NOT DETECTED Final   Candida parapsilosis NOT DETECTED NOT DETECTED Final   Candida tropicalis NOT DETECTED NOT DETECTED Final   Cryptococcus neoformans/gattii NOT DETECTED NOT DETECTED Final    Comment: Performed at [facility], [address]  [place], [place] [address]  Blood Culture (routine x 2)     Status: None   Collection Time: 09/30/22 10:53 PM   Specimen: BLOOD  Result Value Ref Range Status   Specimen Description BLOOD RIGHT ANTECUBITAL  Final   Special Requests   Final    BOTTLES DRAWN AEROBIC AND ANAEROBIC Blood Culture adequate volume   Culture   Final    NO GROWTH 5 DAYS Performed at [facility], [address] [place], [place] [address]    Report Status 10/05/2022 FINAL  Final  Resp panel by RT-PCR (RSV, Flu A&B, Covid) Anterior Nasal Swab     Status: None   Collection Time: 09/30/22 10:56 PM   Specimen: Anterior Nasal Swab  Result Value Ref Range Status   SARS Coronavirus 2 by RT PCR NEGATIVE NEGATIVE Final   Influenza A by PCR NEGATIVE NEGATIVE Final   Influenza B by PCR NEGATIVE NEGATIVE Final    Comment: (NOTE) The Xpert Xpress SARS-CoV-2/FLU/RSV plus assay is intended as an aid in the  diagnosis of influenza from Nasopharyngeal swab specimens and should not be used as a sole basis for treatment. Nasal washings and aspirates are unacceptable for Xpert Xpress SARS-CoV-2/FLU/RSV testing.  Fact Sheet for Patients: BloggerCourse.com  Fact Sheet for Healthcare Providers: SeriousBroker.it  This test is not yet approved or cleared by the [place] FDA and has been authorized for detection and/or diagnosis of SARS-CoV-2 by FDA under an Emergency Use Authorization (EUA). This EUA will remain in effect (meaning this test can be used) for the duration of the COVID-19 declaration under Section 564(b)(1) of the Act, 21 U.S.C. section 360bbb-3(b)(1), unless the authorization is terminated or revoked.     Resp Syncytial Virus by PCR NEGATIVE NEGATIVE Final    Comment: (NOTE) Fact Sheet for Patients: BloggerCourse.com  Fact Sheet for Healthcare Providers: SeriousBroker.it  This test is not yet approved or cleared by the [place] FDA and has been authorized for detection and/or diagnosis of SARS-CoV-2 by FDA under an Emergency Use Authorization (EUA). This EUA will remain in effect (meaning this test can be used) for the duration of the COVID-19 declaration under Section 564(b)(1) of the Act, 21 U.S.C. section 360bbb-3(b)(1), unless the authorization is terminated or revoked.  Performed at [facility], [address], [place], [place] [address]   Urine Culture     Status: None   Collection Time: 10/01/22  1:42 AM   Specimen: Urine, Random  Result Value Ref Range Status   Specimen Description URINE, RANDOM  Final   Special Requests NONE Reflexed from [id number]  Final   Culture   Final    NO GROWTH Performed at [facility], [address] [place], [place] [address]    Report Status 10/02/2022 FINAL  Final  Culture, blood (Routine X 2) w Reflex to ID  Panel     Status: None (Preliminary result)   Collection Time: 10/02/22 12:15 PM   Specimen: BLOOD RIGHT FOREARM  Result Value Ref Range Status   Specimen Description BLOOD RIGHT FOREARM  Final   Special Requests   Final    BOTTLES DRAWN AEROBIC ONLY Blood Culture adequate volume   Culture   Final    NO GROWTH 4 DAYS Performed at [facility], [address] [place], [place] [address]    Report Status PENDING  Incomplete  Culture, blood (Routine X 2) w Reflex to ID Panel     Status: None (Preliminary result)  Collection Time: 10/02/22 12:15 PM   Specimen: BLOOD LEFT HAND  Result Value Ref Range Status   Specimen Description BLOOD LEFT HAND  Final   Special Requests   Final    BOTTLES DRAWN AEROBIC AND ANAEROBIC Blood Culture adequate volume   Culture   Final    NO GROWTH 4 DAYS Performed at [facility], [address] [place], [place] [address]    Report Status PENDING  Incomplete    Pertinent Lab.    Latest Ref Rng & Units 10/03/2022    3:06 AM 10/01/2022    1:12 AM 09/30/2022   11:33 PM  CBC  WBC 4.0 - 10.5 K/uL 6.4  6.5  7.9   Hemoglobin 13.0 - 17.0 g/dL 60.4  54.0  98.1   Hematocrit 39.0 - 52.0 % 35.1  37.2  35.6   Platelets 150 - 400 K/uL 143  79  92       Latest Ref Rng & Units 10/06/2022    2:42 AM 10/05/2022    5:04 AM 10/04/2022    7:12 AM  CMP  Glucose 70 - 99 mg/dL 191  98  92   BUN 6 - 20 mg/dL 8  8  9    Creatinine 0.61 - 1.24 mg/dL 4.78  2.95  6.21   Sodium 135 - 145 mmol/L 132  132  133   Potassium 3.5 - 5.1 mmol/L 4.2  4.1  4.2   Chloride 98 - 111 mmol/L 100  99  103   CO2 22 - 32 mmol/L 26  25  23    Calcium 8.9 - 10.3 mg/dL 8.7  8.4  8.0   Total Protein 6.5 - 8.1 g/dL  6.3    Total Bilirubin 0.3 - 1.2 mg/dL  1.1    Alkaline Phos 38 - 126 U/L  73    AST 15 - 41 U/L  25    ALT 0 - 44 U/L  24       Pertinent Imaging today Plain films and CT images have been personally visualized and interpreted; radiology reports have been reviewed.  Decision making incorporated into the Impression  CT KNEE RIGHT W CONTRAST  Result Date: 10/04/2022 CLINICAL DATA:  Soft tissue infection suspected. EXAM: CT OF THE RIGHT KNEE WITH CONTRAST TECHNIQUE: Multidetector CT imaging was performed following the standard protocol during bolus administration of intravenous contrast. RADIATION DOSE REDUCTION: This exam was performed according to the departmental dose-optimization program which includes automated exposure control, adjustment of the mA and/or kV according to patient size and/or use of iterative reconstruction technique. CONTRAST:  75mL OMNIPAQUE IOHEXOL 350 MG/ML SOLN COMPARISON:  CT of the right thigh 10/01/2022. FINDINGS: Bones/Joint/Cartilage Right thigh findings are dictated separately. No evidence of acute fracture, dislocation or bone destruction. No significant knee joint effusion or abnormal synovial enhancement. No erosive changes. Ligaments Suboptimally assessed by CT. Muscles and Tendons The muscles about the right knee appear normal, without focal atrophy, fluid collection or abnormal enhancement. The quadriceps and patellar tendons are intact. Soft tissues Generalized subcutaneous edema throughout the distal thigh and proximal lower leg has progressed from the CT of 3 days prior. No focal fluid collection, foreign body or soft tissue emphysema. No acute vascular findings are identified. IMPRESSION: 1. Generalized subcutaneous edema throughout the distal thigh and proximal lower leg has progressed from the CT of 3 days prior. This is nonspecific, but could reflect cellulitis. 2. No focal fluid collection, foreign body or soft tissue emphysema. 3. No evidence  of osteomyelitis or septic arthritis. Electronically Signed   By: Carey Bullocks M.D.   On: 10/04/2022 18:15   CT FEMUR RIGHT W CONTRAST  Result Date: 10/04/2022 CLINICAL DATA:  Soft tissue infection suspected. EXAM: CT OF THE LOWER RIGHT EXTREMITY WITH CONTRAST TECHNIQUE: Multidetector CT  imaging of the right thigh was performed according to the standard protocol following intravenous contrast administration. RADIATION DOSE REDUCTION: This exam was performed according to the departmental dose-optimization program which includes automated exposure control, adjustment of the mA and/or kV according to patient size and/or use of iterative reconstruction technique. CONTRAST:  75mL OMNIPAQUE IOHEXOL 350 MG/ML SOLN COMPARISON:  Previous CT 10/01/2022. FINDINGS: Bones/Joint/Cartilage No evidence of acute fracture, dislocation or bone destruction. Mild right hip degenerative changes. No significant joint space narrowing at the right knee. No significant hip or knee joint effusion. Knee details dictated separately. Ligaments Suboptimally assessed by CT. Muscles and Tendons No focal muscular fluid collection or abnormal enhancement identified. No significant tendon abnormalities identified. Soft tissues Multiple enlarged right inguinal lymph nodes are again noted, likely reactive. These measure up to 1.6 cm short axis and appear mildly progressive compared with the recent examination. Mildly progressive diffuse subcutaneous edema throughout the right thigh. No focal fluid collection, foreign body or soft tissue emphysema identified. No acute vascular findings are seen. IMPRESSION: 1. Mildly progressive diffuse subcutaneous edema throughout the right thigh without focal fluid collection, foreign body or soft tissue emphysema. Findings may reflect superficial soft tissue infection (cellulitis). 2. Mildly progressive right inguinal adenopathy, likely reactive. 3. No acute osseous findings. 4. Pelvic and knee findings dictated separately. Electronically Signed   By: Carey Bullocks M.D.   On: 10/04/2022 18:11   CT PELVIS W CONTRAST  Result Date: 10/04/2022 CLINICAL DATA:  Soft tissue infected suspected. EXAM: CT PELVIS WITH CONTRAST TECHNIQUE: Multidetector CT imaging of the pelvis was performed using the  standard protocol following the bolus administration of intravenous contrast. RADIATION DOSE REDUCTION: This exam was performed according to the departmental dose-optimization program which includes automated exposure control, adjustment of the mA and/or kV according to patient size and/or use of iterative reconstruction technique. CONTRAST:  75mL OMNIPAQUE IOHEXOL 350 MG/ML SOLN COMPARISON:  None Available. FINDINGS: Urinary Tract:  Bladder is within normal limits. Bowel: No focal wall thickening or inflammation identified. No dilated bowel loops. Vascular/Lymphatic: There are enlarged right inguinal lymph nodes measuring up to 16 mm short axis. There are numerous prominent, but nonenlarged left inguinal lymph nodes. No significant vascular abnormality seen. Reproductive: Prostate gland is within normal limits. Other: There is no ascites. There is diffuse mild body wall edema. There is a moderate-sized fat containing umbilical hernia. Musculoskeletal: No acute osseous findings. IMPRESSION: 1. Right inguinal lymphadenopathy of uncertain etiology. 2. Mild body wall edema. 3. Moderate-sized fat containing umbilical hernia. Electronically Signed   By: Darliss Cheney M.D.   On: 10/04/2022 18:05     I have personally spent 51 minutes involved in face-to-face and non-face-to-face activities for this patient on the day of the visit. Professional time spent includes the following activities: Preparing to see the patient (review of tests), Obtaining and/or reviewing separately obtained history (admission/discharge record), Performing a medically appropriate examination and/or evaluation , Ordering medications/tests/procedures, referring and communicating with other health care professionals, Documenting clinical information in the EMR, Independently interpreting results (not separately reported), Communicating results to the patient/family/caregiver, Counseling and educating the patient/family/caregiver and Care  coordination (not separately reported).   Plan d/w requesting provider as well as ID  pharm D  Note: This document was prepared using dragon voice recognition software and may include unintentional dictation errors.   Electronically signed by:   Odette Fraction, MD Infectious Disease Physician [facility] Infectious Disease Pager: [phone]

## 2022-10-06 NOTE — Progress Notes (Signed)
Occupational Therapy Treatment Patient Details Name: Jonathan Ibarra MRN: [id number] DOB: 06-14-1965 Today's Date: 10/06/2022   History of present illness Pt is a 57 y/o male presenting 8/1 to the ED via EMS due to AMS, pt lying in bed for 2 days.  PMH  Alcohol abuse, [name], HTN, liver cirrhosis, polysubstance abuse.   OT comments  Pt ambulated in room and to sink with CGA managing IV pole. Completed standing grooming. Pt declined sponge bathing and dressing at sink, wants to shower. Per RN, pt on continuous IV antibiotic at this time.       If plan is discharge home, recommend the following:  A little help with walking and/or transfers;[name];Assist for transportation;Help with stairs or ramp for entrance;Direct supervision/assist for financial management;Direct supervision/assist for medications management;Assistance with cooking/housework   Equipment Recommendations  None recommended by OT    Recommendations for Other Services      Precautions / Restrictions Precautions Precautions: Fall Restrictions Weight Bearing Restrictions: No       Mobility Bed Mobility Overal bed mobility: Independent                  Transfers Overall transfer level: Modified independent Equipment used: None               General transfer comment: pushed IV pole throughout room to gather items necessary for ADLs and to clean up extraneous items from surfaces     Balance Overall balance assessment: Mild deficits observed, not formally tested                                         ADL either performed or assessed with clinical judgement   ADL Overall ADL's : Needs assistance/impaired Eating/Feeding: Independent;Bed level   Grooming: Oral care;Wash/dry hands;Wash/dry face;Brushing hair;Contact guard assist;Standing               Lower Body Dressing: Set up;Sitting/lateral leans Lower Body Dressing Details (indicate  cue type and reason): wears slides             Functional mobility during ADLs: Contact guard assist (pushed IV pole)      Extremity/Trunk Assessment              Vision       Perception     Praxis      Cognition Arousal: Alert Behavior During Therapy: WFL for tasks assessed/performed Overall Cognitive Status: Within Functional Limits for tasks assessed                                 General Comments: for tasks assessed        Exercises      Shoulder Instructions       General Comments      Pertinent Vitals/ Pain       Pain Assessment Pain Assessment: Faces Faces Pain Scale: Hurts a little bit Pain Location: R LE Pain Descriptors / Indicators: Discomfort Pain Intervention(s): Monitored during session, Repositioned  Home Living                                          Prior Functioning/Environment  Frequency  Min 1X/week        Progress Toward Goals  OT Goals(current goals can now be found in the care plan section)  Progress towards OT goals: Progressing toward goals  Acute Rehab OT Goals OT Goal Formulation: With patient Time For Goal Achievement: 10/16/22 Potential to Achieve Goals: Good  Plan      Co-evaluation                 AM-PAC OT "6 Clicks" Daily Activity     Outcome Measure   Help from another person eating meals?: None Help from another person taking care of personal grooming?: A Little Help from another person toileting, which includes using toliet, bedpan, or urinal?: A Little Help from another person bathing (including washing, rinsing, drying)?: A Little Help from another person to put on and taking off regular upper body clothing?: None Help from another person to put on and taking off regular lower body clothing?: None 6 Click Score: 21    End of Session Equipment Utilized During Treatment: Gait belt  OT Visit Diagnosis: Unsteadiness on feet  (R26.81);Muscle weakness (generalized) (M62.81);Other symptoms and signs involving cognitive function   Activity Tolerance Patient tolerated treatment well   Patient Left in bed;with call bell/phone within reach   Nurse Communication Other (comment) (IV meds are continuous, cannot take a shower)        Time: 1130-1147 OT Time Calculation (min): 17 min  Charges: OT General Charges $OT Visit: 1 Visit OT Treatments $Self Care/Home Management : 8-22 mins  Berna Spare, OTR/L Acute Rehabilitation Services Office: [phone]  Evern Bio 10/06/2022, 12:11 PM

## 2022-10-06 NOTE — Progress Notes (Signed)
PROGRESS NOTE    Jonathan Ibarra  AOZ:[id number] DOB: [birth year] DOA: 09/30/2022 PCP: Candi Leash, MD  56/M with history of liver cirrhosis secondary to hep C and alcoholism,sp TIPS, polysubstance abuse presented to the ED with weakness and lethargy, EMS was called by his roommate, patient was encephalopathic in the ER, very poor historian, has not seen a doctor in years. -In the ED he was febrile to 102, tachycardic with SVT, lactate of 3.2, sodium 135, creatinine 0.8, WBC 7.9, hemoglobin 12, platelet 92 K, UA with amber-colored urine, large leukocytes, negative nitrite with rare bacteria, ammonia level 77, CT abdomen pelvis with IV contrast noted bilateral lower lobe atelectasis, noted to have area of erythema and tenderness involving right upper lateral thigh  Subjective: -Feels okay overall however continues to have discomfort in his right thigh  Assessment and Plan:  Strep pyogenes bacteremia Right thigh cellulitis -Blood cultures with strep bacteremia 2/2 -CT femur with nonspecific soft tissue swelling, no abscess, inguinal adenopathy -ID consult appreciated, now on penicillin G and 48 hours of linezolid for antitoxin effect, unfortunately appears slightly worse, repeat CT with mildly progressive diffuse subcutaneous edema -Repeat blood cultures are negative -Slowly improving -Appreciate general surgery consult, increase activity  Transient SVT -Weaned off Cardizem gtt., continue low-dose metoprolol -Stable in NSR now  Decompensated liver cirrhosis -Restart Lasix and Aldactone -History of TIPS  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, poorly compliant with diuretics and lactulose -Improved, continue lactulose -Ambulate, PT OT eval  Chronic thrombocytopenia -Secondary to liver disease  Hypokalemia -Repleted  Polysubstance abuse (HCC) -UDS positive for cannabis and amphetamines, counseled  S DOH, poor compliance with medications, follow-up,  polysubstance abuse -TOC consult appreciated  DVT prophylaxis: SCDs  Code Status: Full code Family Communication: None present Disposition Plan: Home likely 48 hours, pending improvement in his right thigh  Consultants:  Infectious disease, general surgery  Procedures:   Antimicrobials:    Objective: Vitals:   10/05/22 0735 10/05/22 2031 10/06/22 0315 10/06/22 0947  BP: 123/69 117/78 106/66 110/70  Pulse: 61 79 79 75  Resp: 20 18 18 18   Temp: 97.6 F (36.4 C) 97.9 F (36.6 C) 97.7 F (36.5 C) 97.6 F (36.4 C)  TempSrc: Oral Oral Oral Axillary  SpO2:  95% 97% 96%  Weight:      Height:        Intake/Output Summary (Last 24 hours) at 10/06/2022 1214 Last data filed at 10/06/2022 1100 Gross per 24 hour  Intake 1707.6 ml  Output 3250 ml  Net -1542.4 ml   Filed Weights   10/02/22 0246 10/04/22 0359 10/05/22 0446  Weight: 135.6 kg 135.5 kg 132.5 kg    Examination:  Gen: Awake, Alert, Oriented X 3,  HEENT: no JVD Lungs: Good air movement bilaterally, CTAB CVS: S1S2/RRR Abd: soft, Non tender, non distended, BS present Extremities: Right upper lateral thigh with large area of erythema and tenderness, slowly improving Skin: As above Psychiatry:  Mood & affect appropriate.     Data Reviewed:   CBC: Recent Labs  Lab 09/30/22 2315 09/30/22 2333 10/01/22 0112 10/03/22 0306 10/06/22 0744  WBC  --  7.9 6.5 6.4 4.4  NEUTROABS  --  7.3 5.8  --   --   HGB 11.9* 12.0* 12.4* 11.7* 13.1  HCT 35.0* 35.6* 37.2* 35.1* 39.1  MCV  --  91.8 92.1 90.5 89.3  PLT  --  92* 79* 143* 168   Basic Metabolic Panel: Recent Labs  Lab 10/01/22 0112 10/02/22 0231 10/03/22 9629  10/04/22 0712 10/05/22 0504 10/06/22 0242  NA 133* 132* 134* 133* 132* 132*  K 3.0* 3.5 3.6 4.2 4.1 4.2  CL 101 103 102 103 99 100  CO2 22 24 23 23 25 26   GLUCOSE 115* 118* 113* 92 98 111*  BUN 11 15 12 9 8 8   CREATININE 0.79 0.72 0.74 1.26* 0.71 0.60*  CALCIUM 7.8* 8.5* 8.3* 8.0* 8.4* 8.7*  MG  1.2* 1.9  --   --   --   --    GFR: Estimated Creatinine Clearance: 148.9 mL/min (A) (by C-G formula based on SCr of 0.6 mg/dL (L)). Liver Function Tests: Recent Labs  Lab 09/30/22 2253 10/01/22 0112 10/02/22 0231 10/05/22 0504  AST 49* 41 27 25  ALT 44 40 34 24  ALKPHOS 72 66 77 73  BILITOT 2.2* 1.9* 1.3* 1.1  PROT 5.7* 5.1* 5.8* 6.3*  ALBUMIN 2.1* 2.0* 2.1* 2.2*   No results for input(s): "LIPASE", "AMYLASE" in the last 168 hours. Recent Labs  Lab 10/01/22 0115  AMMONIA 77*   Coagulation Profile: Recent Labs  Lab 09/30/22 2253  INR 1.5*   Cardiac Enzymes: No results for input(s): "CKTOTAL", "CKMB", "CKMBINDEX", "TROPONINI" in the last 168 hours. BNP (last 3 results) No results for input(s): "PROBNP" in the last 8760 hours. HbA1C: No results for input(s): "HGBA1C" in the last 72 hours. CBG: No results for input(s): "GLUCAP" in the last 168 hours. Lipid Profile: No results for input(s): "CHOL", "HDL", "LDLCALC", "TRIG", "CHOLHDL", "LDLDIRECT" in the last 72 hours. Thyroid Function Tests: No results for input(s): "TSH", "T4TOTAL", "FREET4", "T3FREE", "THYROIDAB" in the last 72 hours.  Anemia Panel: No results for input(s): "VITAMINB12", "FOLATE", "FERRITIN", "TIBC", "IRON", "RETICCTPCT" in the last 72 hours. Urine analysis:    Component Value Date/Time   COLORURINE AMBER (A) 10/01/2022 0142   APPEARANCEUR CLOUDY (A) 10/01/2022 0142   LABSPEC 1.025 10/01/2022 0142   PHURINE 5.0 10/01/2022 0142   GLUCOSEU NEGATIVE 10/01/2022 0142   HGBUR SMALL (A) 10/01/2022 0142   BILIRUBINUR NEGATIVE 10/01/2022 0142   KETONESUR NEGATIVE 10/01/2022 0142   PROTEINUR 30 (A) 10/01/2022 0142   NITRITE NEGATIVE 10/01/2022 0142   LEUKOCYTESUR LARGE (A) 10/01/2022 0142   Sepsis Labs: @LABRCNTIP (procalcitonin:4,lacticidven:4)  ) Recent Results (from the past 240 hour(s))  Blood Culture (routine x 2)     Status: Abnormal   Collection Time: 09/30/22 10:46 PM   Specimen: BLOOD   Result Value Ref Range Status   Specimen Description BLOOD LEFT ANTECUBITAL  Final   Special Requests   Final    BOTTLES DRAWN AEROBIC AND ANAEROBIC Blood Culture adequate volume   Culture  Setup Time   Final    GRAM POSITIVE COCCI IN CHAINS AEROBIC BOTTLE ONLY CRITICAL RESULT CALLED TO, READ BACK BY AND VERIFIED WITH: PHARMD JESSICA MILLEN ON 10/01/22 @ 1532 BY DRT    Culture (A)  Final    STREPTOCOCCUS PYOGENES HEALTH DEPARTMENT NOTIFIED Performed at [facility], [address], [place], [place] [address]    Report Status 10/03/2022 FINAL  Final   Organism ID, Bacteria STREPTOCOCCUS PYOGENES  Final      Susceptibility   Streptococcus pyogenes - MIC*    PENICILLIN <=0.06 SENSITIVE Sensitive     CEFTRIAXONE <=0.12 SENSITIVE Sensitive     ERYTHROMYCIN <=0.12 SENSITIVE Sensitive     LEVOFLOXACIN 1 SENSITIVE Sensitive     VANCOMYCIN 0.5 SENSITIVE Sensitive     * STREPTOCOCCUS PYOGENES  Blood Culture ID Panel (Reflexed)  Status: Abnormal   Collection Time: 09/30/22 10:46 PM  Result Value Ref Range Status   Enterococcus faecalis NOT DETECTED NOT DETECTED Final   Enterococcus Faecium NOT DETECTED NOT DETECTED Final   Listeria monocytogenes NOT DETECTED NOT DETECTED Final   Staphylococcus species NOT DETECTED NOT DETECTED Final   Staphylococcus aureus (BCID) NOT DETECTED NOT DETECTED Final   Staphylococcus epidermidis NOT DETECTED NOT DETECTED Final   Staphylococcus lugdunensis NOT DETECTED NOT DETECTED Final   Streptococcus species DETECTED (A) NOT DETECTED Final    Comment: CRITICAL RESULT CALLED TO, READ BACK BY AND VERIFIED WITH: PHARMD JESSICA MILLEN ON 10/01/22 @ 1532 BY DRT    Streptococcus agalactiae NOT DETECTED NOT DETECTED Final   Streptococcus pneumoniae NOT DETECTED NOT DETECTED Final   Streptococcus pyogenes DETECTED (A) NOT DETECTED Final    Comment: CRITICAL RESULT CALLED TO, READ BACK BY AND VERIFIED WITH: PHARMD JESSICA MILLEN ON 10/01/22 @ 1532 BY DRT     A.calcoaceticus-baumannii NOT DETECTED NOT DETECTED Final   Bacteroides fragilis NOT DETECTED NOT DETECTED Final   Enterobacterales NOT DETECTED NOT DETECTED Final   Enterobacter cloacae complex NOT DETECTED NOT DETECTED Final   Escherichia coli NOT DETECTED NOT DETECTED Final   Klebsiella aerogenes NOT DETECTED NOT DETECTED Final   Klebsiella oxytoca NOT DETECTED NOT DETECTED Final   Klebsiella pneumoniae NOT DETECTED NOT DETECTED Final   Proteus species NOT DETECTED NOT DETECTED Final   Salmonella species NOT DETECTED NOT DETECTED Final   Serratia marcescens NOT DETECTED NOT DETECTED Final   Haemophilus influenzae NOT DETECTED NOT DETECTED Final   Neisseria meningitidis NOT DETECTED NOT DETECTED Final   Pseudomonas aeruginosa NOT DETECTED NOT DETECTED Final   Stenotrophomonas maltophilia NOT DETECTED NOT DETECTED Final   Candida albicans NOT DETECTED NOT DETECTED Final   Candida auris NOT DETECTED NOT DETECTED Final   Candida glabrata NOT DETECTED NOT DETECTED Final   Candida krusei NOT DETECTED NOT DETECTED Final   Candida parapsilosis NOT DETECTED NOT DETECTED Final   Candida tropicalis NOT DETECTED NOT DETECTED Final   Cryptococcus neoformans/gattii NOT DETECTED NOT DETECTED Final    Comment: Performed at [facility], [address] [place], [place] [address]  Blood Culture (routine x 2)     Status: None   Collection Time: 09/30/22 10:53 PM   Specimen: BLOOD  Result Value Ref Range Status   Specimen Description BLOOD RIGHT ANTECUBITAL  Final   Special Requests   Final    BOTTLES DRAWN AEROBIC AND ANAEROBIC Blood Culture adequate volume   Culture   Final    NO GROWTH 5 DAYS Performed at [facility], [address] [place], [place] [address]    Report Status 10/05/2022 FINAL  Final  Resp panel by RT-PCR (RSV, Flu A&B, Covid) Anterior Nasal Swab     Status: None   Collection Time: 09/30/22 10:56 PM   Specimen: Anterior Nasal Swab  Result Value Ref Range Status    SARS Coronavirus 2 by RT PCR NEGATIVE NEGATIVE Final   Influenza A by PCR NEGATIVE NEGATIVE Final   Influenza B by PCR NEGATIVE NEGATIVE Final    Comment: (NOTE) The Xpert Xpress SARS-CoV-2/FLU/RSV plus assay is intended as an aid in the diagnosis of influenza from Nasopharyngeal swab specimens and should not be used as a sole basis for treatment. Nasal washings and aspirates are unacceptable for Xpert Xpress SARS-CoV-2/FLU/RSV testing.  Fact Sheet for Patients: BloggerCourse.com  Fact Sheet for Healthcare Providers: SeriousBroker.it  This test  is not yet approved or cleared by the [place] and has been authorized for detection and/or diagnosis of SARS-CoV-2 by FDA under an Emergency Use Authorization (EUA). This EUA will remain in effect (meaning this test can be used) for the duration of the COVID-19 declaration under Section 564(b)(1) of the Act, 21 U.S.C. section 360bbb-3(b)(1), unless the authorization is terminated or revoked.     Resp Syncytial Virus by PCR NEGATIVE NEGATIVE Final    Comment: (NOTE) Fact Sheet for Patients: BloggerCourse.com  Fact Sheet for Healthcare Providers: SeriousBroker.it  This test is not yet approved or cleared by the [place] FDA and has been authorized for detection and/or diagnosis of SARS-CoV-2 by FDA under an Emergency Use Authorization (EUA). This EUA will remain in effect (meaning this test can be used) for the duration of the COVID-19 declaration under Section 564(b)(1) of the Act, 21 U.S.C. section 360bbb-3(b)(1), unless the authorization is terminated or revoked.  Performed at [facility], [address], [place], [place] [address]   Urine Culture     Status: None   Collection Time: 10/01/22  1:42 AM   Specimen: Urine, Random  Result Value Ref Range Status   Specimen Description URINE, RANDOM  Final    Special Requests NONE Reflexed from [id number]  Final   Culture   Final    NO GROWTH Performed at [facility], [address] [place], [place] [address]    Report Status 10/02/2022 FINAL  Final  Culture, blood (Routine X 2) w Reflex to ID Panel     Status: None (Preliminary result)   Collection Time: 10/02/22 12:15 PM   Specimen: BLOOD RIGHT FOREARM  Result Value Ref Range Status   Specimen Description BLOOD RIGHT FOREARM  Final   Special Requests   Final    BOTTLES DRAWN AEROBIC ONLY Blood Culture adequate volume   Culture   Final    NO GROWTH 4 DAYS Performed at [facility], [address], [place], [place] [address]    Report Status PENDING  Incomplete  Culture, blood (Routine X 2) w Reflex to ID Panel     Status: None (Preliminary result)   Collection Time: 10/02/22 12:15 PM   Specimen: BLOOD LEFT HAND  Result Value Ref Range Status   Specimen Description BLOOD LEFT HAND  Final   Special Requests   Final    BOTTLES DRAWN AEROBIC AND ANAEROBIC Blood Culture adequate volume   Culture   Final    NO GROWTH 4 DAYS Performed at [facility], [address], [place], [place] [address]    Report Status PENDING  Incomplete     Radiology Studies: CT KNEE RIGHT W CONTRAST  Result Date: 10/04/2022 CLINICAL DATA:  Soft tissue infection suspected. EXAM: CT OF THE RIGHT KNEE WITH CONTRAST TECHNIQUE: Multidetector CT imaging was performed following the standard protocol during bolus administration of intravenous contrast. RADIATION DOSE REDUCTION: This exam was performed according to the departmental dose-optimization program which includes automated exposure control, adjustment of the mA and/or kV according to patient size and/or use of iterative reconstruction technique. CONTRAST:  75mL OMNIPAQUE IOHEXOL 350 MG/ML SOLN COMPARISON:  CT of the right thigh 10/01/2022. FINDINGS: Bones/Joint/Cartilage Right thigh findings are dictated separately. No evidence of acute fracture,  dislocation or bone destruction. No significant knee joint effusion or abnormal synovial enhancement. No erosive changes. Ligaments Suboptimally assessed by CT. Muscles and Tendons The muscles about the right knee appear normal, without focal  atrophy, fluid collection or abnormal enhancement. The quadriceps and patellar tendons are intact. Soft tissues Generalized subcutaneous edema throughout the distal thigh and proximal lower leg has progressed from the CT of 3 days prior. No focal fluid collection, foreign body or soft tissue emphysema. No acute vascular findings are identified. IMPRESSION: 1. Generalized subcutaneous edema throughout the distal thigh and proximal lower leg has progressed from the CT of 3 days prior. This is nonspecific, but could reflect cellulitis. 2. No focal fluid collection, foreign body or soft tissue emphysema. 3. No evidence of osteomyelitis or septic arthritis. Electronically Signed   By: Carey Bullocks M.D.   On: 10/04/2022 18:15   CT FEMUR RIGHT W CONTRAST  Result Date: 10/04/2022 CLINICAL DATA:  Soft tissue infection suspected. EXAM: CT OF THE LOWER RIGHT EXTREMITY WITH CONTRAST TECHNIQUE: Multidetector CT imaging of the right thigh was performed according to the standard protocol following intravenous contrast administration. RADIATION DOSE REDUCTION: This exam was performed according to the departmental dose-optimization program which includes automated exposure control, adjustment of the mA and/or kV according to patient size and/or use of iterative reconstruction technique. CONTRAST:  75mL OMNIPAQUE IOHEXOL 350 MG/ML SOLN COMPARISON:  Previous CT 10/01/2022. FINDINGS: Bones/Joint/Cartilage No evidence of acute fracture, dislocation or bone destruction. Mild right hip degenerative changes. No significant joint space narrowing at the right knee. No significant hip or knee joint effusion. Knee details dictated separately. Ligaments Suboptimally assessed by CT. Muscles and  Tendons No focal muscular fluid collection or abnormal enhancement identified. No significant tendon abnormalities identified. Soft tissues Multiple enlarged right inguinal lymph nodes are again noted, likely reactive. These measure up to 1.6 cm short axis and appear mildly progressive compared with the recent examination. Mildly progressive diffuse subcutaneous edema throughout the right thigh. No focal fluid collection, foreign body or soft tissue emphysema identified. No acute vascular findings are seen. IMPRESSION: 1. Mildly progressive diffuse subcutaneous edema throughout the right thigh without focal fluid collection, foreign body or soft tissue emphysema. Findings may reflect superficial soft tissue infection (cellulitis). 2. Mildly progressive right inguinal adenopathy, likely reactive. 3. No acute osseous findings. 4. Pelvic and knee findings dictated separately. Electronically Signed   By: Carey Bullocks M.D.   On: 10/04/2022 18:11   CT PELVIS W CONTRAST  Result Date: 10/04/2022 CLINICAL DATA:  Soft tissue infected suspected. EXAM: CT PELVIS WITH CONTRAST TECHNIQUE: Multidetector CT imaging of the pelvis was performed using the standard protocol following the bolus administration of intravenous contrast. RADIATION DOSE REDUCTION: This exam was performed according to the departmental dose-optimization program which includes automated exposure control, adjustment of the mA and/or kV according to patient size and/or use of iterative reconstruction technique. CONTRAST:  75mL OMNIPAQUE IOHEXOL 350 MG/ML SOLN COMPARISON:  None Available. FINDINGS: Urinary Tract:  Bladder is within normal limits. Bowel: No focal wall thickening or inflammation identified. No dilated bowel loops. Vascular/Lymphatic: There are enlarged right inguinal lymph nodes measuring up to 16 mm short axis. There are numerous prominent, but nonenlarged left inguinal lymph nodes. No significant vascular abnormality seen. Reproductive:  Prostate gland is within normal limits. Other: There is no ascites. There is diffuse mild body wall edema. There is a moderate-sized fat containing umbilical hernia. Musculoskeletal: No acute osseous findings. IMPRESSION: 1. Right inguinal lymphadenopathy of uncertain etiology. 2. Mild body wall edema. 3. Moderate-sized fat containing umbilical hernia. Electronically Signed   By: Darliss Cheney M.D.   On: 10/04/2022 18:05     Scheduled Meds:  furosemide  40 mg  Oral Daily   lactulose  20 g Oral BID   metoprolol tartrate  25 mg Oral BID   spironolactone  25 mg Oral Daily   Continuous Infusions:  penicillin G potassium 12 Million Units in dextrose 5 % 500 mL CONTINUOUS infusion 12 Million Units (10/06/22 1008)     LOS: 5 days    Time spent:    Zannie Cove, MD [facility]   10/06/2022, 12:14 PM

## 2022-10-07 DIAGNOSIS — I4891 Unspecified atrial fibrillation: Secondary | ICD-10-CM | POA: Diagnosis not present

## 2022-10-07 LAB — CULTURE, BLOOD (ROUTINE X 2)
Culture: NO GROWTH
Culture: NO GROWTH
Special Requests: ADEQUATE
Special Requests: ADEQUATE

## 2022-10-07 NOTE — Progress Notes (Addendum)
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 -In the ED he was febrile to 102, tachycardic with SVT, lactate of 3.2, sodium 135, creatinine 0.8, WBC 7.9, hemoglobin 12, platelet 92 K, UA with amber-colored urine, large leukocytes, negative nitrite with rare bacteria, ammonia level 77, CT abdomen pelvis with IV contrast noted bilateral lower lobe atelectasis, noted to have area of erythema and tenderness involving right upper lateral thigh  Subjective: -Continues to have mild discomfort in his right thigh, otherwise feels okay  Assessment and Plan:  Strep pyogenes bacteremia Right thigh cellulitis -Blood cultures with strep bacteremia 2/2 -CT femur with nonspecific soft tissue swelling, no abscess, inguinal adenopathy -ID consult appreciated, now on penicillin G and linezolid  -repeat CT with mildly progressive diffuse subcutaneous edema -Repeat blood cultures are negative -Finally cellulitis is slowly improving, completed 5 days of linezolid for antitoxin effect, continue penicillin G, switch to oral amoxicillin in 1 to 2 days to complete therapy through 8/16 -Appreciate general surgery consult, increase activity -Discharge planning, home in 1 to 2 days  Transient SVT -Weaned off Cardizem gtt., continue low-dose metoprolol -Stable in NSR now  Decompensated liver cirrhosis -Restart Lasix and Aldactone -History of TIPS  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, poorly compliant with diuretics and lactulose -Improved, continue lactulose -Ambulate, PT OT eval  Chronic thrombocytopenia -Secondary to liver  disease  Hypokalemia -Repleted  Polysubstance abuse (HCC) -UDS positive for cannabis and amphetamines, counseled  S DOH, poor compliance with medications, follow-up, polysubstance abuse -TOC consult appreciated  DVT prophylaxis: SCDs  Code Status: Full code Family Communication: None present Disposition Plan: Home likely 1 to 2 days  Consultants:  Infectious disease, general surgery  Procedures:   Antimicrobials:    Objective: Vitals:   10/06/22 1947 10/07/22 0108 10/07/22 0456 10/07/22 0742  BP: 111/74 110/73 107/69 103/62  Pulse: 78 65 73   Resp: 20 20 18 16   Temp: (!) 97.1 F (36.2 C) 97.6 F (36.4 C) 97.6 F (36.4 C) 98.1 F (36.7 C)  TempSrc: Axillary Oral Oral Oral  SpO2: 94% 94% 92%   Weight:   126.2 kg   Height:        Intake/Output Summary (Last 24 hours) at 10/07/2022 1030 Last data filed at 10/07/2022 0504 Gross per 24 hour  Intake 1027.2 ml  Output 1600 ml  Net -572.8 ml   Filed Weights   10/04/22 0359 10/05/22 0446 10/07/22 0456  Weight: 135.5 kg 132.5 kg 126.2 kg    Examination:  Gen: Awake, Alert, Oriented X 3,  HEENT: no JVD Lungs: Good air movement bilaterally, CTAB CVS: S1S2/RRR Abd: soft, Non tender, non distended, BS present Extremities: No edema Right upper lateral thigh with large area of erythema and tenderness, slowly improving Skin: As above Psychiatry:  Mood & affect appropriate.     Data Reviewed:   CBC: Recent Labs  Lab 09/30/22 2333 10/01/22 0112 10/03/22 0306 10/06/22 0744 10/07/22 0201  WBC 7.9 6.5 6.4 4.4 4.9  NEUTROABS 7.3 5.8  --   --   --   HGB 12.0* 12.4* 11.7* 13.1 13.5  HCT 35.6* 37.2* 35.1* 39.1 40.6  MCV 91.8 92.1 90.5 89.3 89.6  PLT 92* 79* 143* 168 188   Basic Metabolic Panel: Recent Labs  Lab 10/01/22 0112 10/02/22 0231 10/03/22 0306 10/04/22 0712 10/05/22 0504 10/06/22 0242 10/07/22 0201  NA 133* 132* 134* 133* 132* 132* 132*  K 3.0* 3.5 3.6 4.2 4.1 4.2 4.1  CL 101 103 102 103 99  100 99  CO2 22 24 23 23 25 26 24   GLUCOSE 115* 118* 113* 92 98 111* 139*  BUN 11 15 12 9 8 8 11   CREATININE 0.79 0.72 0.74 1.26* 0.71 0.60* 0.76  CALCIUM 7.8* 8.5* 8.3* 8.0* 8.4* 8.7* 8.7*  MG 1.2* 1.9  --   --   --   --   --    GFR: Estimated Creatinine Clearance: 145.2 mL/min (by C-G formula based on SCr of 0.76 mg/dL). Liver Function Tests: Recent Labs  Lab 09/30/22 2253 10/01/22 0112 10/02/22 0231 10/05/22 0504 10/07/22 0201  AST 49* 41 27 25 27   ALT 44 40 34 24 20  ALKPHOS 72 66 77 73 75  BILITOT 2.2* 1.9* 1.3* 1.1 0.9  PROT 5.7* 5.1* 5.8* 6.3* 6.5  ALBUMIN 2.1* 2.0* 2.1* 2.2* 2.3*   No results for input(s): "LIPASE", "AMYLASE" in the last 168 hours. Recent Labs  Lab 10/01/22 0115  AMMONIA 77*   Coagulation Profile: Recent Labs  Lab 09/30/22 2253  INR 1.5*   Cardiac Enzymes: No results for input(s): "CKTOTAL", "CKMB", "CKMBINDEX", "TROPONINI" in the last 168 hours. BNP (last 3 results) No results for input(s): "PROBNP" in the last 8760 hours. HbA1C: No results for input(s): "HGBA1C" in the last 72 hours. CBG: No results for input(s): "GLUCAP" in the last 168 hours. Lipid Profile: No results for input(s): "CHOL", "HDL", "LDLCALC", "TRIG", "CHOLHDL", "LDLDIRECT" in the last 72 hours. Thyroid Function Tests: No results for input(s): "TSH", "T4TOTAL", "FREET4", "T3FREE", "THYROIDAB" in the last 72 hours.  Anemia Panel: No results for input(s): "VITAMINB12", "FOLATE", "FERRITIN", "TIBC", "IRON", "RETICCTPCT" in the last 72 hours. Urine analysis:    Component Value Date/Time   COLORURINE AMBER (A) 10/01/2022 0142   APPEARANCEUR CLOUDY (A) 10/01/2022 0142   LABSPEC 1.025 10/01/2022 0142   PHURINE 5.0 10/01/2022 0142   GLUCOSEU NEGATIVE 10/01/2022 0142   HGBUR SMALL (A) 10/01/2022 0142   BILIRUBINUR NEGATIVE 10/01/2022 0142   KETONESUR NEGATIVE 10/01/2022 0142   PROTEINUR 30 (A) 10/01/2022 0142   NITRITE NEGATIVE 10/01/2022 0142   LEUKOCYTESUR LARGE (A)  10/01/2022 0142   Sepsis Labs: @LABRCNTIP (procalcitonin:4,lacticidven:4)  ) Recent Results (from the past 240 hour(s))  Blood Culture (routine x 2)     Status: Abnormal   Collection Time: 09/30/22 10:46 PM   Specimen: BLOOD  Result Value Ref Range Status   Specimen Description BLOOD LEFT ANTECUBITAL  Final   Special Requests   Final    BOTTLES DRAWN AEROBIC AND ANAEROBIC Blood Culture adequate volume   Culture  Setup Time   Final    GRAM POSITIVE COCCI IN CHAINS AEROBIC BOTTLE ONLY CRITICAL RESULT CALLED TO, READ BACK BY AND VERIFIED WITH: PHARMD JESSICA MILLEN ON 10/01/22 @ 1532 BY DRT    Culture (A)  Final    STREPTOCOCCUS PYOGENES HEALTH DEPARTMENT NOTIFIED Performed at [facility], [address] [place], [place] [address]    Report Status 10/03/2022 FINAL  Final   Organism ID, Bacteria STREPTOCOCCUS PYOGENES  Final      Susceptibility   Streptococcus pyogenes - MIC*    PENICILLIN <=0.06 SENSITIVE Sensitive     CEFTRIAXONE <=0.12 SENSITIVE Sensitive  ERYTHROMYCIN <=0.12 SENSITIVE Sensitive     LEVOFLOXACIN 1 SENSITIVE Sensitive     VANCOMYCIN 0.5 SENSITIVE Sensitive     * STREPTOCOCCUS PYOGENES  Blood Culture ID Panel (Reflexed)     Status: Abnormal   Collection Time: 09/30/22 10:46 PM  Result Value Ref Range Status   Enterococcus faecalis NOT DETECTED NOT DETECTED Final   Enterococcus Faecium NOT DETECTED NOT DETECTED Final   Listeria monocytogenes NOT DETECTED NOT DETECTED Final   Staphylococcus species NOT DETECTED NOT DETECTED Final   Staphylococcus aureus (BCID) NOT DETECTED NOT DETECTED Final   Staphylococcus epidermidis NOT DETECTED NOT DETECTED Final   Staphylococcus lugdunensis NOT DETECTED NOT DETECTED Final   Streptococcus species DETECTED (A) NOT DETECTED Final    Comment: CRITICAL RESULT CALLED TO, READ BACK BY AND VERIFIED WITH: PHARMD JESSICA MILLEN ON 10/01/22 @ 1532 BY DRT    Streptococcus agalactiae NOT DETECTED NOT DETECTED Final    Streptococcus pneumoniae NOT DETECTED NOT DETECTED Final   Streptococcus pyogenes DETECTED (A) NOT DETECTED Final    Comment: CRITICAL RESULT CALLED TO, READ BACK BY AND VERIFIED WITH: PHARMD JESSICA MILLEN ON 10/01/22 @ 1532 BY DRT    A.calcoaceticus-baumannii NOT DETECTED NOT DETECTED Final   Bacteroides fragilis NOT DETECTED NOT DETECTED Final   Enterobacterales NOT DETECTED NOT DETECTED Final   Enterobacter cloacae complex NOT DETECTED NOT DETECTED Final   Escherichia coli NOT DETECTED NOT DETECTED Final   Klebsiella aerogenes NOT DETECTED NOT DETECTED Final   Klebsiella oxytoca NOT DETECTED NOT DETECTED Final   Klebsiella pneumoniae NOT DETECTED NOT DETECTED Final   Proteus species NOT DETECTED NOT DETECTED Final   Salmonella species NOT DETECTED NOT DETECTED Final   Serratia marcescens NOT DETECTED NOT DETECTED Final   Haemophilus influenzae NOT DETECTED NOT DETECTED Final   Neisseria meningitidis NOT DETECTED NOT DETECTED Final   Pseudomonas aeruginosa NOT DETECTED NOT DETECTED Final   Stenotrophomonas maltophilia NOT DETECTED NOT DETECTED Final   Candida albicans NOT DETECTED NOT DETECTED Final   Candida auris NOT DETECTED NOT DETECTED Final   Candida glabrata NOT DETECTED NOT DETECTED Final   Candida krusei NOT DETECTED NOT DETECTED Final   Candida parapsilosis NOT DETECTED NOT DETECTED Final   Candida tropicalis NOT DETECTED NOT DETECTED Final   Cryptococcus neoformans/gattii NOT DETECTED NOT DETECTED Final    Comment: Performed at [facility], [address], [place], [place] [address]  Blood Culture (routine x 2)     Status: None   Collection Time: 09/30/22 10:53 PM   Specimen: BLOOD  Result Value Ref Range Status   Specimen Description BLOOD RIGHT ANTECUBITAL  Final   Special Requests   Final    BOTTLES DRAWN AEROBIC AND ANAEROBIC Blood Culture adequate volume   Culture   Final    NO GROWTH 5 DAYS Performed at [facility], [address],  [place], [place] [address]    Report Status 10/05/2022 FINAL  Final  Resp panel by RT-PCR (RSV, Flu A&B, Covid) Anterior Nasal Swab     Status: None   Collection Time: 09/30/22 10:56 PM   Specimen: Anterior Nasal Swab  Result Value Ref Range Status   SARS Coronavirus 2 by RT PCR NEGATIVE NEGATIVE Final   Influenza A by PCR NEGATIVE NEGATIVE Final   Influenza B by PCR NEGATIVE NEGATIVE Final    Comment: (NOTE) The Xpert Xpress SARS-CoV-2/FLU/RSV plus assay is intended as an aid in the diagnosis of influenza from Nasopharyngeal swab specimens and  should not be used as a sole basis for treatment. Nasal washings and aspirates are unacceptable for Xpert Xpress SARS-CoV-2/FLU/RSV testing.  Fact Sheet for Patients: BloggerCourse.com  Fact Sheet for Healthcare Providers: SeriousBroker.it  This test is not yet approved or cleared by the [place] FDA and has been authorized for detection and/or diagnosis of SARS-CoV-2 by FDA under an Emergency Use Authorization (EUA). This EUA will remain in effect (meaning this test can be used) for the duration of the COVID-19 declaration under Section 564(b)(1) of the Act, 21 U.S.C. section 360bbb-3(b)(1), unless the authorization is terminated or revoked.     Resp Syncytial Virus by PCR NEGATIVE NEGATIVE Final    Comment: (NOTE) Fact Sheet for Patients: BloggerCourse.com  Fact Sheet for Healthcare Providers: SeriousBroker.it  This test is not yet approved or cleared by the [place] FDA and has been authorized for detection and/or diagnosis of SARS-CoV-2 by FDA under an Emergency Use Authorization (EUA). This EUA will remain in effect (meaning this test can be used) for the duration of the COVID-19 declaration under Section 564(b)(1) of the Act, 21 U.S.C. section 360bbb-3(b)(1), unless the authorization is terminated or revoked.  Performed at  [facility], [address] [place], [place] [address]   Urine Culture     Status: None   Collection Time: 10/01/22  1:42 AM   Specimen: Urine, Random  Result Value Ref Range Status   Specimen Description URINE, RANDOM  Final   Special Requests NONE Reflexed from [id number]  Final   Culture   Final    NO GROWTH Performed at [facility], [address], [place], [place] [address]    Report Status 10/02/2022 FINAL  Final  Culture, blood (Routine X 2) w Reflex to ID Panel     Status: None   Collection Time: 10/02/22 12:15 PM   Specimen: BLOOD RIGHT FOREARM  Result Value Ref Range Status   Specimen Description BLOOD RIGHT FOREARM  Final   Special Requests   Final    BOTTLES DRAWN AEROBIC ONLY Blood Culture adequate volume   Culture   Final    NO GROWTH 5 DAYS Performed at [facility], [address], [place], [place] [address]    Report Status 10/07/2022 FINAL  Final  Culture, blood (Routine X 2) w Reflex to ID Panel     Status: None   Collection Time: 10/02/22 12:15 PM   Specimen: BLOOD LEFT HAND  Result Value Ref Range Status   Specimen Description BLOOD LEFT HAND  Final   Special Requests   Final    BOTTLES DRAWN AEROBIC AND ANAEROBIC Blood Culture adequate volume   Culture   Final    NO GROWTH 5 DAYS Performed at [facility], [address] [place], [place] [address]    Report Status 10/07/2022 FINAL  Final     Radiology Studies: No results found.   Scheduled Meds:  furosemide  40 mg Oral Daily   lactulose  20 g Oral BID   metoprolol tartrate  25 mg Oral BID   spironolactone  25 mg Oral Daily   Continuous Infusions:  penicillin G potassium 12 Million Units in dextrose 5 % 500 mL CONTINUOUS infusion 12 Million Units (10/07/22 1024)     LOS: 6 days    Time spent:    Zannie Cove, MD [facility]   10/07/2022, 10:30 AM

## 2022-10-08 ENCOUNTER — Other Ambulatory Visit (HOSPITAL_COMMUNITY): Payer: Self-pay

## 2022-10-08 DIAGNOSIS — I4891 Unspecified atrial fibrillation: Secondary | ICD-10-CM | POA: Diagnosis not present

## 2022-10-08 MED ORDER — LACTULOSE 10 GM/15ML PO SOLN
20.0000 g | Freq: Two times a day (BID) | ORAL | 1 refills | Status: DC
  Filled 2022-10-08: qty 946, 16d supply, fill #0

## 2022-10-08 MED ORDER — AMOXICILLIN 500 MG PO CAPS: 1000.0000 mg | ORAL_CAPSULE | Freq: Three times a day (TID) | ORAL | Status: DC

## 2022-10-08 MED ORDER — FUROSEMIDE 40 MG PO TABS
40.0000 mg | ORAL_TABLET | Freq: Every day | ORAL | 1 refills | Status: DC
Start: 2022-10-08 — End: 2022-12-17
  Filled 2022-10-08: qty 30, 30d supply, fill #0

## 2022-10-08 MED ORDER — AMOXICILLIN 500 MG PO CAPS
1000.0000 mg | ORAL_CAPSULE | Freq: Three times a day (TID) | ORAL | 0 refills | Status: AC
  Filled 2022-10-08: qty 42, 7d supply, fill #0

## 2022-10-08 MED ORDER — SPIRONOLACTONE 25 MG PO TABS
25.0000 mg | ORAL_TABLET | Freq: Every day | ORAL | 1 refills | Status: DC
Start: 2022-10-08 — End: 2022-12-17
  Filled 2022-10-08: qty 30, 30d supply, fill #0

## 2022-10-08 MED ORDER — OXYCODONE HCL 5 MG PO TABS
5.0000 mg | ORAL_TABLET | Freq: Four times a day (QID) | ORAL | 0 refills | Status: DC | PRN
  Filled 2022-10-08: qty 20, 5d supply, fill #0

## 2022-10-08 MED ORDER — METOPROLOL TARTRATE 25 MG PO TABS
25.0000 mg | ORAL_TABLET | Freq: Two times a day (BID) | ORAL | 1 refills | Status: DC
Start: 2022-10-08 — End: 2022-12-17
  Filled 2022-10-08: qty 60, 30d supply, fill #0

## 2022-10-08 NOTE — Progress Notes (Signed)
CSW received consult for patient. CSW spoke with patient at bedside. Patient reports needs transportation assistance. CSW verified address. Patient signed rider waiver. All questions answered. CSW provided taxi voucher for patient. Rider waiver placed in patients hard chart. No further questions reported at this time.

## 2022-10-08 NOTE — Progress Notes (Signed)
Occupational Therapy Treatment and Discharge Patient Details Name: Jonathan Ibarra MRN: [id number] DOB: August 20, 1965 Today's Date: 10/08/2022   History of present illness Pt is a 57 y/o male presenting 8/1 to the ED via EMS due to AMS, pt lying in bed for 2 days.  PMH  Alcohol abuse, [name], HTN, liver cirrhosis, polysubstance abuse.   OT comments  Pt took a standing shower and dressed independently. Fatigued at end of session with shower being extensive. No further OT needs. Signing off.       If plan is discharge home, recommend the following:  Assist for transportation   Equipment Recommendations  None recommended by OT    Recommendations for Other Services      Precautions / Restrictions Precautions Precautions: None Restrictions Weight Bearing Restrictions: No       Mobility Bed Mobility Overal bed mobility: Independent                  Transfers Overall transfer level: Independent Equipment used: None                     Balance                                           ADL either performed or assessed with clinical judgement   ADL Overall ADL's : Independent                                     Functional mobility during ADLs: Independent General ADL Comments: showered in standing, dressed himself    Extremity/Trunk Assessment              Vision       Perception     Praxis      Cognition Arousal: Alert Behavior During Therapy: WFL for tasks assessed/performed Overall Cognitive Status: Within Functional Limits for tasks assessed                                          Exercises      Shoulder Instructions       General Comments      Pertinent Vitals/ Pain       Pain Assessment Pain Assessment: No/denies pain  Home Living                                          Prior Functioning/Environment              Frequency            Progress Toward Goals  OT Goals(current goals can now be found in the care plan section)  Progress towards OT goals: Goals met/education completed, patient discharged from OT     Plan      Co-evaluation                 AM-PAC OT "6 Clicks" Daily Activity     Outcome Measure   Help from another person eating meals?: None Help from another person taking care of personal grooming?: None Help from another person toileting, which includes using toliet,  bedpan, or urinal?: None Help from another person bathing (including washing, rinsing, drying)?: None Help from another person to put on and taking off regular upper body clothing?: None Help from another person to put on and taking off regular lower body clothing?: None 6 Click Score: 24    End of Session        Activity Tolerance Patient tolerated treatment well   Patient Left in chair;with call bell/phone within reach   Nurse Communication          Time: 6387-5643 OT Time Calculation (min): 40 min  Charges: OT General Charges $OT Visit: 1 Visit OT Treatments $Self Care/Home Management : 38-52 mins  Jonathan Ibarra, OTR/L Acute Rehabilitation Services Office: [phone]   Jonathan Ibarra 10/08/2022, 10:35 AM

## 2022-10-08 NOTE — TOC Transition Note (Signed)
Transition of Care [facility]) - CM/SW Discharge Note   Patient Details  Name: Jonathan Ibarra MRN: [id number] Date of Birth: December 19, 1965  [facility] CM/SW Contact:  Gala Lewandowsky, RN Phone Number: 10/08/2022, 11:31 AM   Clinical Narrative:  Case Manager spoke with patient and the patient is without a PCP. Patient is agreeable for Case Manager to make a referral with the [facility]-appointment scheduled and placed on the AVS. Medications delivered via [facility]. At the Clinic- CSW is aware that the patient will need assistance establishing a mail order pharmacy outpatient. No further needs identified at this time.    Final next level of care: Home/Self Care Barriers to Discharge: No Barriers Identified  Patient Goals and CMS Choice   Choice offered to / list presented to : NA  Discharge Plan and Services Additional resources added to the After Visit Summary for     Discharge Planning Services: CM Consult, Follow-up appt scheduled, [facility]  Social Determinants of Health (SDOH) Interventions SDOH Screenings   Food Insecurity: No Food Insecurity (10/01/2022)  Housing: Low Risk  (10/01/2022)  Transportation Needs: No Transportation Needs (10/01/2022)  Utilities: Not At Risk (10/01/2022)  Financial Resource Strain: Medium Risk (01/07/2020)   Received from [facility], [facility]  Social Connections: Unknown (07/05/2021)   Received from [facility], [facility]  Stress: No Stress Concern Present (01/07/2020)   Received from [facility], [facility]  Tobacco Use: High Risk (10/01/2022)     Readmission Risk Interventions     No data to display

## 2022-10-08 NOTE — Progress Notes (Signed)
Mobility Specialist: Progress Note   10/08/22 1141  Mobility  Activity Ambulated with assistance in hallway  Level of Assistance Standby assist, set-up cues, supervision of patient - no hands on  Assistive Device None  Distance Ambulated (ft) 375 ft  Activity Response Tolerated well  Mobility Referral Yes  $Mobility charge 1 Mobility  Mobility Specialist Start Time (ACUTE ONLY) 1134  Mobility Specialist Stop Time (ACUTE ONLY) 1138  Mobility Specialist Time Calculation (min) (ACUTE ONLY) 4 min   Received pt in bed having no complaints and agreeable to mobility. Pt was asymptomatic throughout ambulation and returned to room w/o fault. Left on EOB w/ call bell in reach and all needs met.  Maurene Capes Mobility Specialist Please contact via SecureChat or Rehab office at [phone]

## 2022-10-08 NOTE — Discharge Summary (Signed)
Physician Discharge Summary  Jonathan Ibarra AVW:[id number] DOB: 1965-04-28 DOA: 09/30/2022  PCP: Candi Leash, MD  Admit date: 09/30/2022 Discharge date: 10/08/2022  Time spent: 35 minutes  Recommendations for Outpatient Follow-up:  PCP in 1 week Home health PT, RN   Discharge Diagnoses:  Strep pyogenes bacteremia Right thigh cellulitis Decompensated liver cirrhosis Hepatic encephalopathy Polysubstance abuse Chronic thrombocytopenia   Hypokalemia   SIRS without infection with organ dysfunction (HCC)   Hepatic cirrhosis (HCC)   Polysubstance abuse (HCC)   S/P TIPS (transjugular intrahepatic portosystemic shunt)   Discharge Condition: Improved  Diet recommendation: Low-sodium  Filed Weights   10/05/22 0446 10/07/22 0456 10/08/22 0304  Weight: 132.5 kg 126.2 kg 124.1 kg    History of present illness:  57/M with history of liver cirrhosis secondary to hep C and alcoholism,sp TIPS, polysubstance abuse presented to the ED with weakness and lethargy, EMS was called by his roommate, patient was encephalopathic in the ER, very poor historian, has not seen a doctor in years. -In the ED he was febrile to 102, tachycardic with SVT, lactate of 3.2, sodium 135, creatinine 0.8, WBC 7.9, hemoglobin 12, platelet 92 K, UA with amber-colored urine, large leukocytes, negative nitrite with rare bacteria, ammonia level 77, CT abdomen pelvis with IV contrast noted bilateral lower lobe atelectasis, noted to have area of erythema and tenderness involving right upper lateral thigh  Hospital Course:   Strep pyogenes bacteremia Right thigh cellulitis -Blood cultures with strep bacteremia 2/2 -CT femur with nonspecific soft tissue swelling, no abscess, inguinal adenopathy -ID consult appreciated, treated with long course of penicillin G and linezolid  -repeat CT with mildly progressive diffuse subcutaneous edema -Repeat blood cultures are negative -Finally cellulitis is slowly improving,  completed 5 days of linezolid for antitoxin effect, transitioned to oral amoxicillin to complete therapy for 7 more days through 8/16  -Appreciate general surgery consult,, continued antibiotic therapy recommended -Discharged home in a stable condition, follow-up with PCP in 1 week   Transient SVT -Weaned off Cardizem gtt., continue low-dose metoprolol -Stable in NSR now   Decompensated liver cirrhosis -Restart Lasix and Aldactone -History of TIPS   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, poorly compliant with diuretics and lactulose -Improved, continue lactulose -Ambulate, PT OT eval   Chronic thrombocytopenia -Secondary to liver disease   Hypokalemia -Repleted   Polysubstance abuse (HCC) -UDS positive for cannabis and amphetamines, counseled  Consultants: Infectious disease and general surgery Discharge Exam: Vitals:   10/07/22 2121 10/08/22 0304  BP: 113/63 104/63  Pulse: 69 76  Resp:  18  Temp: 98.8 F (37.1 C) 98.3 F (36.8 C)  SpO2: 95% 92%    Gen: Awake, Alert, Oriented X 3,  HEENT: no JVD Lungs: Good air movement bilaterally, CTAB CVS: S1S2/RRR Abd: soft, Non tender, non distended, BS present Extremities: No edema Right upper lateral thigh with large area of erythema and tenderness, slowly improving Skin: As above Psychiatry:  Mood & affect appropriate.   Discharge Instructions   Discharge Instructions     Diet - low sodium heart healthy   Complete by: As directed    Increase activity slowly   Complete by: As directed       Allergies as of 10/08/2022       Reactions   Tylenol [acetaminophen] Other (See Comments)   "Not good for liver"        Medication List     TAKE these medications    amoxicillin 500 MG capsule Commonly known as: AMOXIL  Take 2 capsules (1,000 mg total) by mouth every 8 (eight) hours for 7 days.   furosemide 40 MG tablet Commonly known as: LASIX Take 1 tablet (40 mg total) by mouth daily.    lactulose 10 GM/15ML solution Commonly known as: CHRONULAC Take 30 mLs (20 g total) by mouth 2 (two) times daily.   metoprolol tartrate 25 MG tablet Commonly known as: LOPRESSOR Take 1 tablet (25 mg total) by mouth 2 (two) times daily.   spironolactone 25 MG tablet Commonly known as: ALDACTONE Take 1 tablet (25 mg total) by mouth daily.       Allergies  Allergen Reactions   Tylenol [Acetaminophen] Other (See Comments)    "Not good for liver"      The results of significant diagnostics from this hospitalization (including imaging, microbiology, ancillary and laboratory) are listed below for reference.    Significant Diagnostic Studies: CT KNEE RIGHT W CONTRAST  Result Date: 10/04/2022 CLINICAL DATA:  Soft tissue infection suspected. EXAM: CT OF THE RIGHT KNEE WITH CONTRAST TECHNIQUE: Multidetector CT imaging was performed following the standard protocol during bolus administration of intravenous contrast. RADIATION DOSE REDUCTION: This exam was performed according to the departmental dose-optimization program which includes automated exposure control, adjustment of the mA and/or kV according to patient size and/or use of iterative reconstruction technique. CONTRAST:  75mL OMNIPAQUE IOHEXOL 350 MG/ML SOLN COMPARISON:  CT of the right thigh 10/01/2022. FINDINGS: Bones/Joint/Cartilage Right thigh findings are dictated separately. No evidence of acute fracture, dislocation or bone destruction. No significant knee joint effusion or abnormal synovial enhancement. No erosive changes. Ligaments Suboptimally assessed by CT. Muscles and Tendons The muscles about the right knee appear normal, without focal atrophy, fluid collection or abnormal enhancement. The quadriceps and patellar tendons are intact. Soft tissues Generalized subcutaneous edema throughout the distal thigh and proximal lower leg has progressed from the CT of 3 days prior. No focal fluid collection, foreign body or soft tissue  emphysema. No acute vascular findings are identified. IMPRESSION: 1. Generalized subcutaneous edema throughout the distal thigh and proximal lower leg has progressed from the CT of 3 days prior. This is nonspecific, but could reflect cellulitis. 2. No focal fluid collection, foreign body or soft tissue emphysema. 3. No evidence of osteomyelitis or septic arthritis. Electronically Signed   By: Carey Bullocks M.D.   On: 10/04/2022 18:15   CT FEMUR RIGHT W CONTRAST  Result Date: 10/04/2022 CLINICAL DATA:  Soft tissue infection suspected. EXAM: CT OF THE LOWER RIGHT EXTREMITY WITH CONTRAST TECHNIQUE: Multidetector CT imaging of the right thigh was performed according to the standard protocol following intravenous contrast administration. RADIATION DOSE REDUCTION: This exam was performed according to the departmental dose-optimization program which includes automated exposure control, adjustment of the mA and/or kV according to patient size and/or use of iterative reconstruction technique. CONTRAST:  75mL OMNIPAQUE IOHEXOL 350 MG/ML SOLN COMPARISON:  Previous CT 10/01/2022. FINDINGS: Bones/Joint/Cartilage No evidence of acute fracture, dislocation or bone destruction. Mild right hip degenerative changes. No significant joint space narrowing at the right knee. No significant hip or knee joint effusion. Knee details dictated separately. Ligaments Suboptimally assessed by CT. Muscles and Tendons No focal muscular fluid collection or abnormal enhancement identified. No significant tendon abnormalities identified. Soft tissues Multiple enlarged right inguinal lymph nodes are again noted, likely reactive. These measure up to 1.6 cm short axis and appear mildly progressive compared with the recent examination. Mildly progressive diffuse subcutaneous edema throughout the right thigh. No focal fluid collection, foreign body  or soft tissue emphysema identified. No acute vascular findings are seen. IMPRESSION: 1. Mildly  progressive diffuse subcutaneous edema throughout the right thigh without focal fluid collection, foreign body or soft tissue emphysema. Findings may reflect superficial soft tissue infection (cellulitis). 2. Mildly progressive right inguinal adenopathy, likely reactive. 3. No acute osseous findings. 4. Pelvic and knee findings dictated separately. Electronically Signed   By: Carey Bullocks M.D.   On: 10/04/2022 18:11   CT PELVIS W CONTRAST  Result Date: 10/04/2022 CLINICAL DATA:  Soft tissue infected suspected. EXAM: CT PELVIS WITH CONTRAST TECHNIQUE: Multidetector CT imaging of the pelvis was performed using the standard protocol following the bolus administration of intravenous contrast. RADIATION DOSE REDUCTION: This exam was performed according to the departmental dose-optimization program which includes automated exposure control, adjustment of the mA and/or kV according to patient size and/or use of iterative reconstruction technique. CONTRAST:  75mL OMNIPAQUE IOHEXOL 350 MG/ML SOLN COMPARISON:  None Available. FINDINGS: Urinary Tract:  Bladder is within normal limits. Bowel: No focal wall thickening or inflammation identified. No dilated bowel loops. Vascular/Lymphatic: There are enlarged right inguinal lymph nodes measuring up to 16 mm short axis. There are numerous prominent, but nonenlarged left inguinal lymph nodes. No significant vascular abnormality seen. Reproductive: Prostate gland is within normal limits. Other: There is no ascites. There is diffuse mild body wall edema. There is a moderate-sized fat containing umbilical hernia. Musculoskeletal: No acute osseous findings. IMPRESSION: 1. Right inguinal lymphadenopathy of uncertain etiology. 2. Mild body wall edema. 3. Moderate-sized fat containing umbilical hernia. Electronically Signed   By: Darliss Cheney M.D.   On: 10/04/2022 18:05   CT FEMUR RIGHT W CONTRAST  Result Date: 10/02/2022 CLINICAL DATA:  Right thigh soft tissue infection. EXAM:  CT OF THE LOWER RIGHT EXTREMITY WITH CONTRAST TECHNIQUE: Multidetector CT imaging of the lower right extremity was performed according to the standard protocol following intravenous contrast administration. RADIATION DOSE REDUCTION: This exam was performed according to the departmental dose-optimization program which includes automated exposure control, adjustment of the mA and/or kV according to patient size and/or use of iterative reconstruction technique. CONTRAST:  OMNIPAQUE IOHEXOL 350 MG/ML SOLN COMPARISON:  None Available. FINDINGS: Bones/Joint/Cartilage No fracture or dislocation. Mild degenerative changes of the right hip and knee. No significant joint effusion. Ligaments Ligaments are suboptimally evaluated by CT. Muscles and Tendons Grossly intact. Soft tissue Multiple enlarged right inguinal lymph nodes measuring up to 2.1 cm in short axis. Nonspecific soft tissue swelling of the right thigh. No fluid collection or subcutaneous emphysema. No soft tissue mass. IMPRESSION: 1. Nonspecific soft tissue swelling of the right thigh. No abscess. 2. Right inguinal lymphadenopathy may be reactive given presumed right thigh cellulitis. Lymphoproliferative disease is considered less likely given lack of pathologic lymphadenopathy in the chest, abdomen, or pelvis on yesterday's CT. 3.  No acute osseous abnormality. Electronically Signed   By: Obie Dredge M.D.   On: 10/02/2022 08:11   DG Chest 2 View  Result Date: 10/01/2022 CLINICAL DATA:  Sepsis, fever and chills EXAM: CHEST - 2 VIEW COMPARISON:  The 124 FINDINGS: Frontal and lateral views of the chest demonstrate a stable cardiac silhouette. Increased central vascular congestion, without focal consolidation. Trace bilateral effusions. No pneumothorax. IMPRESSION: 1. Central vascular congestion without pulmonary edema. 2. Trace bilateral pleural effusions. Electronically Signed   By: Sharlet Salina M.D.   On: 10/01/2022 17:53   ECHOCARDIOGRAM  COMPLETE  Result Date: 10/01/2022    ECHOCARDIOGRAM REPORT   Patient Name:  [name] Date of Exam: 10/01/2022 Medical Rec #:  [id number]          Height:       73.0 in Accession #:    [id number]         Weight:       300.3 lb Date of Birth:  Dec 16, 1965         BSA:          2.557 m Patient Age:    56 years           BP:           113/74 mmHg Patient Gender: M                  HR:           84 bpm. Exam Location:  Inpatient Procedure: 2D Echo, Intracardiac Opacification Agent, Color Doppler and Cardiac            Doppler Indications:    Atrial Fibrillation  History:        Patient has no prior history of Echocardiogram examinations.                 Cirrhosis, Arrythmias:Atrial Fibrillation; Risk                 Factors:Poly-substance abuse.  Sonographer:    Delcie Roch RDCS Referring Phys: [phone] ERIC CHEN IMPRESSIONS  1. Left ventricular ejection fraction, by estimation, is 55 to 60%. The left ventricle has normal function. The left ventricle has no regional wall motion abnormalities. Left ventricular diastolic parameters are indeterminate.  2. Right ventricular systolic function is normal. The right ventricular size is normal. Tricuspid regurgitation signal is inadequate for assessing PA pressure.  3. Left atrial size was mildly dilated.  4. Right atrial size was mildly dilated.  5. The mitral valve is normal in structure. No evidence of mitral valve regurgitation. No evidence of mitral stenosis.  6. The aortic valve is tricuspid. There is mild calcification of the aortic valve. Aortic valve regurgitation is not visualized. No aortic stenosis is present.  7. The inferior vena cava is dilated in size with >50% respiratory variability, suggesting right atrial pressure of 8 mmHg.  8. The patient was in atrial fibrillation. FINDINGS  Left Ventricle: Left ventricular ejection fraction, by estimation, is 55 to 60%. The left ventricle has normal function. The left ventricle has no regional wall motion  abnormalities. Definity contrast agent was given IV to delineate the left ventricular  endocardial borders. The left ventricular internal cavity size was normal in size. There is no left ventricular hypertrophy. Left ventricular diastolic parameters are indeterminate. Right Ventricle: The right ventricular size is normal. No increase in right ventricular wall thickness. Right ventricular systolic function is normal. Tricuspid regurgitation signal is inadequate for assessing PA pressure. Left Atrium: Left atrial size was mildly dilated. Right Atrium: Right atrial size was mildly dilated. Pericardium: There is no evidence of pericardial effusion. Mitral Valve: The mitral valve is normal in structure. No evidence of mitral valve regurgitation. No evidence of mitral valve stenosis. Tricuspid Valve: The tricuspid valve is normal in structure. Tricuspid valve regurgitation is trivial. Aortic Valve: The aortic valve is tricuspid. There is mild calcification of the aortic valve. Aortic valve regurgitation is not visualized. No aortic stenosis is present. Pulmonic Valve: The pulmonic valve was normal in structure. Pulmonic valve regurgitation is not visualized. Aorta: The aortic root is normal in size and structure. Venous: The inferior vena cava  is dilated in size with greater than 50% respiratory variability, suggesting right atrial pressure of 8 mmHg. IAS/Shunts: No atrial level shunt detected by color flow Doppler.  LEFT VENTRICLE PLAX 2D LVIDd:         5.50 cm LVIDs:         3.60 cm LV PW:         1.10 cm LV IVS:        1.10 cm LVOT diam:     2.30 cm LVOT Area:     4.15 cm  RIGHT VENTRICLE          IVC RV Basal diam:  3.50 cm  IVC diam: 2.50 cm LEFT ATRIUM             Index        RIGHT ATRIUM           Index LA diam:        4.10 cm 1.60 cm/m   RA Area:     20.00 cm LA Vol (A2C):   84.9 ml 33.20 ml/m  RA Volume:   59.60 ml  23.31 ml/m LA Vol (A4C):   80.1 ml 31.32 ml/m LA Biplane Vol: 82.4 ml 32.22 ml/m   AORTA  Ao Root diam: 3.20 cm Ao Asc diam:  3.20 cm  SHUNTS Systemic Diam: 2.30 cm Dalton McleanMD Electronically signed by Wilfred Lacy Signature Date/Time: 10/01/2022/5:03:21 PM    Final    CT CHEST ABDOMEN PELVIS W CONTRAST  Result Date: 10/01/2022 CLINICAL DATA:  Sepsis hx ov cirrhosis with TIPS EXAM: CT CHEST, ABDOMEN, AND PELVIS WITH CONTRAST TECHNIQUE: Multidetector CT imaging of the chest, abdomen and pelvis was performed following the standard protocol during bolus administration of intravenous contrast. RADIATION DOSE REDUCTION: This exam was performed according to the departmental dose-optimization program which includes automated exposure control, adjustment of the mA and/or kV according to patient size and/or use of iterative reconstruction technique. CONTRAST:  75mL OMNIPAQUE IOHEXOL 350 MG/ML SOLN COMPARISON:  CT abdomen pelvis 01/25/2015 FINDINGS: CT CHEST FINDINGS Cardiovascular: Normal heart size. No significant pericardial effusion. The thoracic aorta is normal in caliber. No atherosclerotic plaque of the thoracic aorta. No coronary artery calcifications. The main pulmonary artery is normal in caliber. No central pulmonary embolus. Mediastinum/Nodes: No enlarged mediastinal, hilar, or axillary lymph nodes. Thyroid gland, trachea, and esophagus demonstrate no significant findings. Small hiatal hernia. Lungs/Pleura: Limited evaluation due to motion artifact. Bilateral lower lobe passive atelectasis. No focal consolidation. No pulmonary nodule. No pulmonary mass. Trace bilateral pleural effusions, right greater than left. No pneumothorax. Musculoskeletal: Bilateral gynecomastia. No suspicious lytic or blastic osseous lesions. Likely subacute right anterior sixth rib fracture (3:47). Chronic midthoracic vertebral body vertebral body height loss with associated exaggerated kyphosis. CT ABDOMEN PELVIS FINDINGS Hepatobiliary: Tips procedure noted. No focal liver abnormality. Status post cholecystectomy. No  biliary dilatation. Pancreas: No focal lesion. Normal pancreatic contour. No surrounding inflammatory changes. No main pancreatic ductal dilatation. Spleen: Normal in size without focal abnormality. Adrenals/Urinary Tract: No adrenal nodule bilaterally. Bilateral kidneys enhance symmetrically. Punctate left nephrolithiasis No hydronephrosis. No hydroureter. The urinary bladder is unremarkable. Stomach/Bowel: Limited evaluation due to motion artifact stomach is within normal limits. No evidence of bowel wall thickening or dilatation. Appendix appears normal. Vascular/Lymphatic: Slightly increased trace periaortic fat stranding and prominent but nonenlarged lymph nodes (3:81-91) no abdominal aorta or iliac aneurysm. Mild atherosclerotic plaque of the aorta and its branches. Right inguinal lymphadenopathy with lymph node measuring up to 1.9 cm. Associated subcutaneus soft  tissue edema. No abdominalor pelvic lymphadenopathy. Reproductive: Prostate is unremarkable. Scrotum collimated off view. Other: No intraperitoneal free fluid. No intraperitoneal free gas. No organized fluid collection. Musculoskeletal: Small fat containing umbilical hernia. No suspicious lytic or blastic osseous lesions. No acute displaced fracture. Multilevel degenerative changes of the spine. IMPRESSION: 1.  Trace bilateral pleural effusions, right greater than left. 2. Right inguinal lymphadenopathy. Please note scrotum collimated off view. Recommend correlation with physical exam. 3. Slightly increased trace periaortic fat stranding and prominent but nonenlarged lymph nodes. Question developing retroperitoneal fibrosis. Recommend attention on follow-up. 4. Nonobstructive punctate left nephrolithiasis. 5. Likely subacute right anterior sixth rib fracture. Correlate with point tenderness to palpation to evaluate for an acute component. 6. TIPS procedure. Electronically Signed   By: Tish Frederickson M.D.   On: 10/01/2022 02:00   DG Chest Port 1  View  Result Date: 09/30/2022 CLINICAL DATA:  Possible sepsis fever EXAM: PORTABLE CHEST 1 VIEW COMPARISON:  02/04/2015 FINDINGS: Cardiomegaly with mild central congestion. No consolidation, pleural effusion or pneumothorax. Low lung volumes IMPRESSION: Cardiomegaly with mild central congestion. Electronically Signed   By: Jasmine Pang M.D.   On: 09/30/2022 23:18    Microbiology: Recent Results (from the past 240 hour(s))  Blood Culture (routine x 2)     Status: Abnormal   Collection Time: 09/30/22 10:46 PM   Specimen: BLOOD  Result Value Ref Range Status   Specimen Description BLOOD LEFT ANTECUBITAL  Final   Special Requests   Final    BOTTLES DRAWN AEROBIC AND ANAEROBIC Blood Culture adequate volume   Culture  Setup Time   Final    GRAM POSITIVE COCCI IN CHAINS AEROBIC BOTTLE ONLY CRITICAL RESULT CALLED TO, READ BACK BY AND VERIFIED WITH: PHARMD JESSICA MILLEN ON 10/01/22 @ 1532 BY DRT    Culture (A)  Final    STREPTOCOCCUS PYOGENES HEALTH DEPARTMENT NOTIFIED Performed at [facility], [address] [place], [place] [address]    Report Status 10/03/2022 FINAL  Final   Organism ID, Bacteria STREPTOCOCCUS PYOGENES  Final      Susceptibility   Streptococcus pyogenes - MIC*    PENICILLIN <=0.06 SENSITIVE Sensitive     CEFTRIAXONE <=0.12 SENSITIVE Sensitive     ERYTHROMYCIN <=0.12 SENSITIVE Sensitive     LEVOFLOXACIN 1 SENSITIVE Sensitive     VANCOMYCIN 0.5 SENSITIVE Sensitive     * STREPTOCOCCUS PYOGENES  Blood Culture ID Panel (Reflexed)     Status: Abnormal   Collection Time: 09/30/22 10:46 PM  Result Value Ref Range Status   Enterococcus faecalis NOT DETECTED NOT DETECTED Final   Enterococcus Faecium NOT DETECTED NOT DETECTED Final   Listeria monocytogenes NOT DETECTED NOT DETECTED Final   Staphylococcus species NOT DETECTED NOT DETECTED Final   Staphylococcus aureus (BCID) NOT DETECTED NOT DETECTED Final   Staphylococcus epidermidis NOT DETECTED NOT DETECTED Final    Staphylococcus lugdunensis NOT DETECTED NOT DETECTED Final   Streptococcus species DETECTED (A) NOT DETECTED Final    Comment: CRITICAL RESULT CALLED TO, READ BACK BY AND VERIFIED WITH: PHARMD JESSICA MILLEN ON 10/01/22 @ 1532 BY DRT    Streptococcus agalactiae NOT DETECTED NOT DETECTED Final   Streptococcus pneumoniae NOT DETECTED NOT DETECTED Final   Streptococcus pyogenes DETECTED (A) NOT DETECTED Final    Comment: CRITICAL RESULT CALLED TO, READ BACK BY AND VERIFIED WITH: PHARMD JESSICA MILLEN ON 10/01/22 @ 1532 BY DRT    A.calcoaceticus-baumannii NOT DETECTED NOT DETECTED Final   Bacteroides fragilis NOT DETECTED NOT  DETECTED Final   Enterobacterales NOT DETECTED NOT DETECTED Final   Enterobacter cloacae complex NOT DETECTED NOT DETECTED Final   Escherichia coli NOT DETECTED NOT DETECTED Final   Klebsiella aerogenes NOT DETECTED NOT DETECTED Final   Klebsiella oxytoca NOT DETECTED NOT DETECTED Final   Klebsiella pneumoniae NOT DETECTED NOT DETECTED Final   Proteus species NOT DETECTED NOT DETECTED Final   Salmonella species NOT DETECTED NOT DETECTED Final   Serratia marcescens NOT DETECTED NOT DETECTED Final   Haemophilus influenzae NOT DETECTED NOT DETECTED Final   Neisseria meningitidis NOT DETECTED NOT DETECTED Final   Pseudomonas aeruginosa NOT DETECTED NOT DETECTED Final   Stenotrophomonas maltophilia NOT DETECTED NOT DETECTED Final   Candida albicans NOT DETECTED NOT DETECTED Final   Candida auris NOT DETECTED NOT DETECTED Final   Candida glabrata NOT DETECTED NOT DETECTED Final   Candida krusei NOT DETECTED NOT DETECTED Final   Candida parapsilosis NOT DETECTED NOT DETECTED Final   Candida tropicalis NOT DETECTED NOT DETECTED Final   Cryptococcus neoformans/gattii NOT DETECTED NOT DETECTED Final    Comment: Performed at [facility], [address], [place], [place] [address]  Blood Culture (routine x 2)     Status: None   Collection Time: 09/30/22 10:53 PM    Specimen: BLOOD  Result Value Ref Range Status   Specimen Description BLOOD RIGHT ANTECUBITAL  Final   Special Requests   Final    BOTTLES DRAWN AEROBIC AND ANAEROBIC Blood Culture adequate volume   Culture   Final    NO GROWTH 5 DAYS Performed at [facility], [address], [place], [place] [address]    Report Status 10/05/2022 FINAL  Final  Resp panel by RT-PCR (RSV, Flu A&B, Covid) Anterior Nasal Swab     Status: None   Collection Time: 09/30/22 10:56 PM   Specimen: Anterior Nasal Swab  Result Value Ref Range Status   SARS Coronavirus 2 by RT PCR NEGATIVE NEGATIVE Final   Influenza A by PCR NEGATIVE NEGATIVE Final   Influenza B by PCR NEGATIVE NEGATIVE Final    Comment: (NOTE) The Xpert Xpress SARS-CoV-2/FLU/RSV plus assay is intended as an aid in the diagnosis of influenza from Nasopharyngeal swab specimens and should not be used as a sole basis for treatment. Nasal washings and aspirates are unacceptable for Xpert Xpress SARS-CoV-2/FLU/RSV testing.  Fact Sheet for Patients: BloggerCourse.com  Fact Sheet for Healthcare Providers: SeriousBroker.it  This test is not yet approved or cleared by the [place] FDA and has been authorized for detection and/or diagnosis of SARS-CoV-2 by FDA under an Emergency Use Authorization (EUA). This EUA will remain in effect (meaning this test can be used) for the duration of the COVID-19 declaration under Section 564(b)(1) of the Act, 21 U.S.C. section 360bbb-3(b)(1), unless the authorization is terminated or revoked.     Resp Syncytial Virus by PCR NEGATIVE NEGATIVE Final    Comment: (NOTE) Fact Sheet for Patients: BloggerCourse.com  Fact Sheet for Healthcare Providers: SeriousBroker.it  This test is not yet approved or cleared by the [place] FDA and has been authorized for detection and/or diagnosis of SARS-CoV-2  by FDA under an Emergency Use Authorization (EUA). This EUA will remain in effect (meaning this test can be used) for the duration of the COVID-19 declaration under Section 564(b)(1) of the Act, 21 U.S.C. section 360bbb-3(b)(1), unless the authorization is terminated or revoked.  Performed at [facility], [address], [place], [place] [address]  Urine Culture     Status: None   Collection Time: 10/01/22  1:42 AM   Specimen: Urine, Random  Result Value Ref Range Status   Specimen Description URINE, RANDOM  Final   Special Requests NONE Reflexed from [id number]  Final   Culture   Final    NO GROWTH Performed at [facility], [address], [place], [place] [address]    Report Status 10/02/2022 FINAL  Final  Culture, blood (Routine X 2) w Reflex to ID Panel     Status: None   Collection Time: 10/02/22 12:15 PM   Specimen: BLOOD RIGHT FOREARM  Result Value Ref Range Status   Specimen Description BLOOD RIGHT FOREARM  Final   Special Requests   Final    BOTTLES DRAWN AEROBIC ONLY Blood Culture adequate volume   Culture   Final    NO GROWTH 5 DAYS Performed at [facility], [address], [place], [place] [address]    Report Status 10/07/2022 FINAL  Final  Culture, blood (Routine X 2) w Reflex to ID Panel     Status: None   Collection Time: 10/02/22 12:15 PM   Specimen: BLOOD LEFT HAND  Result Value Ref Range Status   Specimen Description BLOOD LEFT HAND  Final   Special Requests   Final    BOTTLES DRAWN AEROBIC AND ANAEROBIC Blood Culture adequate volume   Culture   Final    NO GROWTH 5 DAYS Performed at [facility], [address], [place], [place] [address]    Report Status 10/07/2022 FINAL  Final     Labs: Basic Metabolic Panel: Recent Labs  Lab 10/02/22 0231 10/03/22 0306 10/04/22 9562 10/05/22 0504 10/06/22 0242 10/07/22 0201 10/08/22 0607  NA 132*   < > 133* 132* 132* 132* 132*  K 3.5   < > 4.2 4.1 4.2 4.1 4.4  CL 103   < > 103  99 100 99 99  CO2 24   < > 23 25 26 24 24   GLUCOSE 118*   < > 92 98 111* 139* 89  BUN 15   < > 9 8 8 11 10   CREATININE 0.72   < > 1.26* 0.71 0.60* 0.76 0.82  CALCIUM 8.5*   < > 8.0* 8.4* 8.7* 8.7* 8.7*  MG 1.9  --   --   --   --   --   --    < > = values in this interval not displayed.   Liver Function Tests: Recent Labs  Lab 10/02/22 0231 10/05/22 0504 10/07/22 0201  AST 27 25 27   ALT 34 24 20  ALKPHOS 77 73 75  BILITOT 1.3* 1.1 0.9  PROT 5.8* 6.3* 6.5  ALBUMIN 2.1* 2.2* 2.3*   No results for input(s): "LIPASE", "AMYLASE" in the last 168 hours. No results for input(s): "AMMONIA" in the last 168 hours. CBC: Recent Labs  Lab 10/03/22 0306 10/06/22 0744 10/07/22 0201 10/08/22 0607  WBC 6.4 4.4 4.9 5.8  HGB 11.7* 13.1 13.5 13.6  HCT 35.1* 39.1 40.6 40.5  MCV 90.5 89.3 89.6 90.8  PLT 143* 168 188 229   Cardiac Enzymes: No results for input(s): "CKTOTAL", "CKMB", "CKMBINDEX", "TROPONINI" in the last 168 hours. BNP: BNP (last 3 results) Recent Labs    09/30/22 2333  BNP 197.7*    ProBNP (last 3 results) No results for input(s): "PROBNP" in the last 8760 hours.  CBG: No results for input(s): "GLUCAP" in the last  168 hours.     Signed:  Zannie Cove MD.  [facility] 10/08/2022, 10:42 AM

## 2022-10-25 ENCOUNTER — Inpatient Hospital Stay (INDEPENDENT_AMBULATORY_CARE_PROVIDER_SITE_OTHER): Payer: 59 | Admitting: Primary Care

## 2022-12-12 ENCOUNTER — Emergency Department (HOSPITAL_COMMUNITY): Payer: Commercial Managed Care - HMO

## 2022-12-12 ENCOUNTER — Inpatient Hospital Stay (HOSPITAL_COMMUNITY)
Admission: EM | Admit: 2022-12-12 | Discharge: 2022-12-17 | DRG: 488 | Disposition: A | Discharge status: Home Health | Payer: Commercial Managed Care - HMO | Attending: Internal Medicine | Admitting: Internal Medicine

## 2022-12-12 DIAGNOSIS — W131XXA Fall from, out of or through bridge, initial encounter: Secondary | ICD-10-CM | POA: Diagnosis present

## 2022-12-12 DIAGNOSIS — F4321 Adjustment disorder with depressed mood: Secondary | ICD-10-CM | POA: Diagnosis present

## 2022-12-12 DIAGNOSIS — E278 Other specified disorders of adrenal gland: Secondary | ICD-10-CM | POA: Diagnosis present

## 2022-12-12 DIAGNOSIS — I10 Essential (primary) hypertension: Secondary | ICD-10-CM | POA: Diagnosis present

## 2022-12-12 DIAGNOSIS — Z79899 Other long term (current) drug therapy: Secondary | ICD-10-CM

## 2022-12-12 DIAGNOSIS — Y92821 Forest as the place of occurrence of the external cause: Secondary | ICD-10-CM

## 2022-12-12 DIAGNOSIS — S83281A Other tear of lateral meniscus, current injury, right knee, initial encounter: Secondary | ICD-10-CM | POA: Diagnosis present

## 2022-12-12 DIAGNOSIS — S2241XA Multiple fractures of ribs, right side, initial encounter for closed fracture: Secondary | ICD-10-CM | POA: Diagnosis present

## 2022-12-12 DIAGNOSIS — E669 Obesity, unspecified: Secondary | ICD-10-CM | POA: Diagnosis present

## 2022-12-12 DIAGNOSIS — K7460 Unspecified cirrhosis of liver: Secondary | ICD-10-CM | POA: Diagnosis present

## 2022-12-12 DIAGNOSIS — S82141A Displaced bicondylar fracture of right tibia, initial encounter for closed fracture: Principal | ICD-10-CM | POA: Diagnosis present

## 2022-12-12 DIAGNOSIS — F1210 Cannabis abuse, uncomplicated: Secondary | ICD-10-CM | POA: Diagnosis present

## 2022-12-12 DIAGNOSIS — W010XXA Fall on same level from slipping, tripping and stumbling without subsequent striking against object, initial encounter: Secondary | ICD-10-CM | POA: Diagnosis present

## 2022-12-12 DIAGNOSIS — Z91199 Patient's noncompliance with other medical treatment and regimen due to unspecified reason: Secondary | ICD-10-CM

## 2022-12-12 DIAGNOSIS — Z6836 Body mass index (BMI) 36.0-36.9, adult: Secondary | ICD-10-CM

## 2022-12-12 DIAGNOSIS — I480 Paroxysmal atrial fibrillation: Secondary | ICD-10-CM | POA: Diagnosis present

## 2022-12-12 DIAGNOSIS — Z886 Allergy status to analgesic agent status: Secondary | ICD-10-CM

## 2022-12-12 DIAGNOSIS — W19XXXA Unspecified fall, initial encounter: Principal | ICD-10-CM

## 2022-12-12 DIAGNOSIS — G40909 Epilepsy, unspecified, not intractable, without status epilepticus: Secondary | ICD-10-CM | POA: Diagnosis present

## 2022-12-12 DIAGNOSIS — Z23 Encounter for immunization: Secondary | ICD-10-CM

## 2022-12-12 DIAGNOSIS — F17200 Nicotine dependence, unspecified, uncomplicated: Secondary | ICD-10-CM | POA: Diagnosis present

## 2022-12-12 DIAGNOSIS — B1920 Unspecified viral hepatitis C without hepatic coma: Secondary | ICD-10-CM | POA: Diagnosis present

## 2022-12-12 DIAGNOSIS — F1510 Other stimulant abuse, uncomplicated: Secondary | ICD-10-CM | POA: Diagnosis present

## 2022-12-12 LAB — COMPREHENSIVE METABOLIC PANEL
ALT: 19 U/L (ref 0–44)
AST: 26 U/L (ref 15–41)
Albumin: 2.9 g/dL — ABNORMAL LOW (ref 3.5–5.0)
Alkaline Phosphatase: 127 U/L — ABNORMAL HIGH (ref 38–126)
Anion gap: 11 (ref 5–15)
BUN: 16 mg/dL (ref 6–20)
CO2: 24 mmol/L (ref 22–32)
Calcium: 9.3 mg/dL (ref 8.9–10.3)
Chloride: 102 mmol/L (ref 98–111)
Creatinine, Ser: 1.08 mg/dL (ref 0.61–1.24)
GFR, Estimated: >60 mL/min (ref >60)
Glucose, Bld: 102 mg/dL — ABNORMAL HIGH (ref 70–99)
Potassium: 4 mmol/L (ref 3.5–5.1)
Sodium: 137 mmol/L (ref 135–145)
Total Bilirubin: 2.6 mg/dL — ABNORMAL HIGH (ref 0.3–1.2)
Total Protein: 7.5 g/dL (ref 6.5–8.1)

## 2022-12-12 LAB — CBC
HCT: 39.4 % (ref 39.0–52.0)
Hemoglobin: 13.3 g/dL (ref 13.0–17.0)
MCH: 31.4 pg (ref 26.0–34.0)
MCHC: 33.8 g/dL (ref 30.0–36.0)
MCV: 93.1 fL (ref 80.0–100.0)
Platelets: 184 10*3/uL (ref 150–400)
RBC: 4.23 MIL/uL (ref 4.22–5.81)
RDW: 14.5 % (ref 11.5–15.5)
WBC: 7.6 10*3/uL (ref 4.0–10.5)
nRBC: 0 % (ref 0.0–0.2)

## 2022-12-12 LAB — PROTIME-INR
INR: 1.2 (ref 0.8–1.2)
Prothrombin Time: 15.6 s — ABNORMAL HIGH (ref 11.4–15.2)

## 2022-12-12 LAB — SAMPLE TO BLOOD BANK

## 2022-12-12 LAB — I-STAT CHEM 8, ED
BUN: 17 mg/dL (ref 6–20)
Calcium, Ion: 1.16 mmol/L (ref 1.15–1.40)
Chloride: 104 mmol/L (ref 98–111)
Creatinine, Ser: 1 mg/dL (ref 0.61–1.24)
Glucose, Bld: 101 mg/dL — ABNORMAL HIGH (ref 70–99)
HCT: 37 % — ABNORMAL LOW (ref 39.0–52.0)
Hemoglobin: 12.6 g/dL — ABNORMAL LOW (ref 13.0–17.0)
Potassium: 4 mmol/L (ref 3.5–5.1)
Sodium: 138 mmol/L (ref 135–145)
TCO2: 24 mmol/L (ref 22–32)

## 2022-12-12 LAB — CK: Total CK: 76 U/L (ref 49–397)

## 2022-12-12 LAB — SALICYLATE LEVEL: Salicylate Lvl: <7 mg/dL — ABNORMAL LOW (ref 7.0–30.0)

## 2022-12-12 LAB — I-STAT CG4 LACTIC ACID, ED: Lactic Acid, Venous: 1.5 mmol/L (ref 0.5–1.9)

## 2022-12-12 LAB — ACETAMINOPHEN LEVEL: Acetaminophen (Tylenol), Serum: <10 ug/mL — ABNORMAL LOW (ref 10–30)

## 2022-12-12 LAB — ETHANOL: Alcohol, Ethyl (B): <10 mg/dL (ref <10)

## 2022-12-12 MED ORDER — FENTANYL CITRATE PF 50 MCG/ML IJ SOSY
50.0000 ug | PREFILLED_SYRINGE | Freq: Once | INTRAMUSCULAR | Status: AC
  Administered 2022-12-12: 50 ug via INTRAVENOUS
  Filled 2022-12-12: qty 1

## 2022-12-12 MED ORDER — TETANUS-DIPHTH-ACELL PERTUSSIS 5-2.5-18.5 LF-MCG/0.5 IM SUSY
0.5000 mL | PREFILLED_SYRINGE | Freq: Once | INTRAMUSCULAR | Status: AC
  Administered 2022-12-12: 0.5 mL via INTRAMUSCULAR

## 2022-12-12 MED ORDER — IOHEXOL 350 MG/ML SOLN
75.0000 mL | Freq: Once | INTRAVENOUS | Status: AC | PRN
  Administered 2022-12-12: 75 mL via INTRAVENOUS
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
## 2022-12-12 NOTE — ED Notes (Signed)
Pt returned from scanner.

## 2022-12-12 NOTE — ED Notes (Signed)
Patient transported to CT 
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
## 2022-12-13 ENCOUNTER — Other Ambulatory Visit: Payer: Self-pay

## 2022-12-13 ENCOUNTER — Inpatient Hospital Stay (HOSPITAL_COMMUNITY): Payer: Commercial Managed Care - HMO

## 2022-12-13 ENCOUNTER — Encounter (HOSPITAL_COMMUNITY)
Admission: EM | Disposition: A | Discharge status: Home Health | Payer: Self-pay | Source: Home / Self Care | Attending: Internal Medicine

## 2022-12-13 ENCOUNTER — Encounter (HOSPITAL_COMMUNITY): Payer: Self-pay | Admitting: Internal Medicine

## 2022-12-13 DIAGNOSIS — Z6836 Body mass index (BMI) 36.0-36.9, adult: Secondary | ICD-10-CM | POA: Diagnosis not present

## 2022-12-13 DIAGNOSIS — S2241XA Multiple fractures of ribs, right side, initial encounter for closed fracture: Secondary | ICD-10-CM | POA: Diagnosis present

## 2022-12-13 DIAGNOSIS — Y92821 Forest as the place of occurrence of the external cause: Secondary | ICD-10-CM | POA: Diagnosis not present

## 2022-12-13 DIAGNOSIS — S82101A Unspecified fracture of upper end of right tibia, initial encounter for closed fracture: Secondary | ICD-10-CM

## 2022-12-13 DIAGNOSIS — Z91199 Patient's noncompliance with other medical treatment and regimen due to unspecified reason: Secondary | ICD-10-CM | POA: Diagnosis not present

## 2022-12-13 DIAGNOSIS — F4321 Adjustment disorder with depressed mood: Secondary | ICD-10-CM | POA: Diagnosis present

## 2022-12-13 DIAGNOSIS — R45851 Suicidal ideations: Secondary | ICD-10-CM | POA: Diagnosis not present

## 2022-12-13 DIAGNOSIS — E278 Other specified disorders of adrenal gland: Secondary | ICD-10-CM | POA: Diagnosis present

## 2022-12-13 DIAGNOSIS — W010XXA Fall on same level from slipping, tripping and stumbling without subsequent striking against object, initial encounter: Secondary | ICD-10-CM | POA: Diagnosis present

## 2022-12-13 DIAGNOSIS — F17200 Nicotine dependence, unspecified, uncomplicated: Secondary | ICD-10-CM | POA: Diagnosis present

## 2022-12-13 DIAGNOSIS — W131XXA Fall from, out of or through bridge, initial encounter: Secondary | ICD-10-CM | POA: Diagnosis not present

## 2022-12-13 DIAGNOSIS — I10 Essential (primary) hypertension: Secondary | ICD-10-CM

## 2022-12-13 DIAGNOSIS — E669 Obesity, unspecified: Secondary | ICD-10-CM | POA: Diagnosis present

## 2022-12-13 DIAGNOSIS — I480 Paroxysmal atrial fibrillation: Secondary | ICD-10-CM | POA: Diagnosis present

## 2022-12-13 DIAGNOSIS — I4891 Unspecified atrial fibrillation: Secondary | ICD-10-CM

## 2022-12-13 DIAGNOSIS — S83281A Other tear of lateral meniscus, current injury, right knee, initial encounter: Secondary | ICD-10-CM | POA: Diagnosis present

## 2022-12-13 DIAGNOSIS — Z79899 Other long term (current) drug therapy: Secondary | ICD-10-CM | POA: Diagnosis not present

## 2022-12-13 DIAGNOSIS — S82141A Displaced bicondylar fracture of right tibia, initial encounter for closed fracture: Secondary | ICD-10-CM | POA: Diagnosis present

## 2022-12-13 DIAGNOSIS — F1510 Other stimulant abuse, uncomplicated: Secondary | ICD-10-CM | POA: Diagnosis present

## 2022-12-13 DIAGNOSIS — F1210 Cannabis abuse, uncomplicated: Secondary | ICD-10-CM | POA: Diagnosis present

## 2022-12-13 DIAGNOSIS — B1920 Unspecified viral hepatitis C without hepatic coma: Secondary | ICD-10-CM | POA: Diagnosis present

## 2022-12-13 DIAGNOSIS — G40909 Epilepsy, unspecified, not intractable, without status epilepticus: Secondary | ICD-10-CM | POA: Diagnosis present

## 2022-12-13 DIAGNOSIS — K7460 Unspecified cirrhosis of liver: Secondary | ICD-10-CM | POA: Diagnosis present

## 2022-12-13 DIAGNOSIS — Z23 Encounter for immunization: Secondary | ICD-10-CM | POA: Diagnosis not present

## 2022-12-13 DIAGNOSIS — Z886 Allergy status to analgesic agent status: Secondary | ICD-10-CM | POA: Diagnosis not present

## 2022-12-13 HISTORY — PX: ORIF TIBIA PLATEAU: SHX2132

## 2022-12-13 LAB — BASIC METABOLIC PANEL
Anion gap: 8 (ref 5–15)
BUN: 11 mg/dL (ref 6–20)
CO2: 24 mmol/L (ref 22–32)
Calcium: 8.6 mg/dL — ABNORMAL LOW (ref 8.9–10.3)
Chloride: 103 mmol/L (ref 98–111)
Creatinine, Ser: 0.8 mg/dL (ref 0.61–1.24)
GFR, Estimated: >60 mL/min (ref >60)
Glucose, Bld: 110 mg/dL — ABNORMAL HIGH (ref 70–99)
Potassium: 4.2 mmol/L (ref 3.5–5.1)
Sodium: 135 mmol/L (ref 135–145)

## 2022-12-13 LAB — CBC
HCT: 41.2 % (ref 39.0–52.0)
Hemoglobin: 13.7 g/dL (ref 13.0–17.0)
MCH: 30.6 pg (ref 26.0–34.0)
MCHC: 33.3 g/dL (ref 30.0–36.0)
MCV: 92 fL (ref 80.0–100.0)
Platelets: 184 10*3/uL (ref 150–400)
RBC: 4.48 MIL/uL (ref 4.22–5.81)
RDW: 14.5 % (ref 11.5–15.5)
WBC: 7.7 10*3/uL (ref 4.0–10.5)
nRBC: 0 % (ref 0.0–0.2)

## 2022-12-13 LAB — RAPID URINE DRUG SCREEN, HOSP PERFORMED
Amphetamines: POSITIVE — AB
Barbiturates: NOT DETECTED
Benzodiazepines: NOT DETECTED
Cocaine: NOT DETECTED
Opiates: NOT DETECTED
Tetrahydrocannabinol: POSITIVE — AB

## 2022-12-13 LAB — HEPATITIS B SURFACE ANTIGEN: Hepatitis B Surface Ag: NONREACTIVE

## 2022-12-13 LAB — SURGICAL PCR SCREEN
MRSA, PCR: POSITIVE — AB
Staphylococcus aureus: POSITIVE — AB

## 2022-12-13 LAB — HEPATITIS B SURFACE ANTIBODY,QUALITATIVE: Hep B S Ab: NONREACTIVE

## 2022-12-13 LAB — AMMONIA: Ammonia: 36 umol/L — ABNORMAL HIGH (ref 9–35)

## 2022-12-13 LAB — TYPE AND SCREEN
ABO/RH(D): B POS
Antibody Screen: NEGATIVE

## 2022-12-13 LAB — PHOSPHORUS: Phosphorus: 2.9 mg/dL (ref 2.5–4.6)

## 2022-12-13 LAB — MAGNESIUM: Magnesium: 1.8 mg/dL (ref 1.7–2.4)

## 2022-12-13 LAB — HEPATITIS B CORE ANTIBODY, TOTAL: Hep B Core Total Ab: NONREACTIVE

## 2022-12-13 LAB — HEPATITIS C ANTIBODY: HCV Ab: REACTIVE — AB

## 2022-12-13 SURGERY — OPEN REDUCTION INTERNAL FIXATION (ORIF) TIBIAL PLATEAU
Anesthesia: General | Site: Leg Upper | Laterality: Right

## 2022-12-13 MED ORDER — SUCCINYLCHOLINE CHLORIDE 200 MG/10ML IV SOSY
PREFILLED_SYRINGE | INTRAVENOUS | Status: DC | PRN
  Administered 2022-12-13: 160 mg via INTRAVENOUS

## 2022-12-13 MED ORDER — FENTANYL CITRATE (PF) 100 MCG/2ML IJ SOLN
INTRAMUSCULAR | Status: AC
  Filled 2022-12-13: qty 2

## 2022-12-13 MED ORDER — ACETAMINOPHEN 325 MG PO TABS: 650.0000 mg | ORAL_TABLET | Freq: Four times a day (QID) | ORAL | Status: DC | PRN

## 2022-12-13 MED ORDER — ONDANSETRON HCL 4 MG/2ML IJ SOLN
4.0000 mg | Freq: Once | INTRAMUSCULAR | Status: AC
  Administered 2022-12-13: 4 mg via INTRAVENOUS
  Filled 2022-12-13: qty 2

## 2022-12-13 MED ORDER — TOBRAMYCIN SULFATE 1.2 G IJ SOLR
INTRAMUSCULAR | Status: AC
  Filled 2022-12-13: qty 1.2

## 2022-12-13 MED ORDER — KETOROLAC TROMETHAMINE 15 MG/ML IJ SOLN
30.0000 mg | Freq: Four times a day (QID) | INTRAMUSCULAR | Status: DC
  Administered 2022-12-13 – 2022-12-17 (×15): 30 mg via INTRAVENOUS
  Filled 2022-12-13 (×15): qty 2

## 2022-12-13 MED ORDER — CHLORHEXIDINE GLUCONATE 4 % EX SOLN
60.0000 mL | Freq: Once | CUTANEOUS | Status: AC
  Administered 2022-12-13: 4 via TOPICAL

## 2022-12-13 MED ORDER — OXYCODONE HCL 5 MG/5ML PO SOLN: 5.0000 mg | Freq: Once | ORAL | Status: DC | PRN

## 2022-12-13 MED ORDER — ONDANSETRON HCL 4 MG/2ML IJ SOLN
INTRAMUSCULAR | Status: AC
  Filled 2022-12-13: qty 2

## 2022-12-13 MED ORDER — THIAMINE MONONITRATE 100 MG PO TABS
100.0000 mg | ORAL_TABLET | Freq: Every day | ORAL | Status: DC
  Administered 2022-12-13 – 2022-12-17 (×5): 100 mg via ORAL
  Filled 2022-12-13 (×5): qty 1

## 2022-12-13 MED ORDER — MUPIROCIN 2 % EX OINT
TOPICAL_OINTMENT | Freq: Two times a day (BID) | CUTANEOUS | Status: DC
  Filled 2022-12-13 (×2): qty 22

## 2022-12-13 MED ORDER — METOPROLOL TARTRATE 25 MG PO TABS
25.0000 mg | ORAL_TABLET | Freq: Two times a day (BID) | ORAL | Status: DC
  Administered 2022-12-13 – 2022-12-17 (×8): 25 mg via ORAL
  Filled 2022-12-13 (×8): qty 1

## 2022-12-13 MED ORDER — HYDROMORPHONE HCL 1 MG/ML IJ SOLN
1.0000 mg | Freq: Once | INTRAMUSCULAR | Status: AC
  Administered 2022-12-13: 1 mg via INTRAVENOUS
  Filled 2022-12-13: qty 1

## 2022-12-13 MED ORDER — ADULT MULTIVITAMIN W/MINERALS CH
1.0000 | ORAL_TABLET | Freq: Every day | ORAL | Status: DC
  Administered 2022-12-13 – 2022-12-17 (×5): 1 via ORAL
  Filled 2022-12-13 (×5): qty 1

## 2022-12-13 MED ORDER — FENTANYL CITRATE (PF) 250 MCG/5ML IJ SOLN
INTRAMUSCULAR | Status: AC
  Filled 2022-12-13: qty 5

## 2022-12-13 MED ORDER — HYDROMORPHONE HCL 1 MG/ML IJ SOLN
0.5000 mg | Freq: Once | INTRAMUSCULAR | Status: AC
  Administered 2022-12-13: 0.5 mg via INTRAVENOUS
  Filled 2022-12-13: qty 1

## 2022-12-13 MED ORDER — FENTANYL CITRATE (PF) 100 MCG/2ML IJ SOLN: 25.0000 ug | INTRAMUSCULAR | Status: DC | PRN

## 2022-12-13 MED ORDER — OXYCODONE HCL 5 MG PO TABS
10.0000 mg | ORAL_TABLET | ORAL | Status: DC | PRN
  Administered 2022-12-13 – 2022-12-17 (×8): 10 mg via ORAL
  Filled 2022-12-13 (×8): qty 2

## 2022-12-13 MED ORDER — PROPOFOL 10 MG/ML IV BOLUS
INTRAVENOUS | Status: AC
  Filled 2022-12-13: qty 20

## 2022-12-13 MED ORDER — LIDOCAINE 2% (20 MG/ML) 5 ML SYRINGE
INTRAMUSCULAR | Status: AC
  Filled 2022-12-13: qty 5

## 2022-12-13 MED ORDER — CHLORHEXIDINE GLUCONATE 0.12 % MT SOLN
OROMUCOSAL | Status: AC
  Administered 2022-12-13: 15 mL
  Filled 2022-12-13: qty 15

## 2022-12-13 MED ORDER — CEFAZOLIN SODIUM-DEXTROSE 2-4 GM/100ML-% IV SOLN
INTRAVENOUS | Status: AC
  Filled 2022-12-13: qty 100

## 2022-12-13 MED ORDER — FOLIC ACID 1 MG PO TABS
1.0000 mg | ORAL_TABLET | Freq: Every day | ORAL | Status: DC
  Administered 2022-12-13 – 2022-12-17 (×5): 1 mg via ORAL
  Filled 2022-12-13 (×5): qty 1

## 2022-12-13 MED ORDER — ACETAMINOPHEN 325 MG PO TABS: 650.0000 mg | ORAL_TABLET | Freq: Three times a day (TID) | ORAL | Status: DC

## 2022-12-13 MED ORDER — ROCURONIUM BROMIDE 10 MG/ML (PF) SYRINGE
PREFILLED_SYRINGE | INTRAVENOUS | Status: AC
  Filled 2022-12-13: qty 10

## 2022-12-13 MED ORDER — HYDROMORPHONE HCL 1 MG/ML IJ SOLN
0.5000 mg | INTRAMUSCULAR | Status: DC | PRN
  Administered 2022-12-13 – 2022-12-17 (×3): 0.5 mg via INTRAVENOUS
  Filled 2022-12-13 (×2): qty 0.5
  Filled 2022-12-13: qty 1

## 2022-12-13 MED ORDER — LIDOCAINE 2% (20 MG/ML) 5 ML SYRINGE
INTRAMUSCULAR | Status: DC | PRN
  Administered 2022-12-13: 60 mg via INTRAVENOUS

## 2022-12-13 MED ORDER — OXYCODONE HCL 5 MG PO TABS: 5.0000 mg | ORAL_TABLET | Freq: Once | ORAL | Status: DC | PRN

## 2022-12-13 MED ORDER — METHOCARBAMOL 500 MG PO TABS: 500.0000 mg | ORAL_TABLET | Freq: Three times a day (TID) | ORAL | Status: DC | PRN

## 2022-12-13 MED ORDER — THIAMINE HCL 100 MG/ML IJ SOLN
100.0000 mg | Freq: Every day | INTRAMUSCULAR | Status: DC
  Filled 2022-12-13: qty 2

## 2022-12-13 MED ORDER — SODIUM CHLORIDE 0.9 % IV BOLUS
1000.0000 mL | Freq: Once | INTRAVENOUS | Status: AC
  Administered 2022-12-13: 1000 mL via INTRAVENOUS

## 2022-12-13 MED ORDER — POVIDONE-IODINE 10 % EX SWAB
2.0000 | Freq: Once | CUTANEOUS | Status: AC
  Administered 2022-12-13: 2 via TOPICAL

## 2022-12-13 MED ORDER — PROPOFOL 10 MG/ML IV BOLUS
INTRAVENOUS | Status: DC | PRN
  Administered 2022-12-13: 200 mg via INTRAVENOUS

## 2022-12-13 MED ORDER — LACTATED RINGERS IV SOLN
INTRAVENOUS | Status: DC | PRN
Start: 2022-12-13 — End: 2022-12-13

## 2022-12-13 MED ORDER — 0.9 % SODIUM CHLORIDE (POUR BTL) OPTIME
TOPICAL | Status: DC | PRN
  Administered 2022-12-13: 1000 mL

## 2022-12-13 MED ORDER — FENTANYL CITRATE (PF) 250 MCG/5ML IJ SOLN
INTRAMUSCULAR | Status: DC | PRN
  Administered 2022-12-13: 50 ug via INTRAVENOUS

## 2022-12-13 MED ORDER — ONDANSETRON HCL 4 MG/2ML IJ SOLN
INTRAMUSCULAR | Status: DC | PRN
  Administered 2022-12-13: 4 mg via INTRAVENOUS

## 2022-12-13 MED ORDER — OXYCODONE HCL 5 MG PO TABS: 5.0000 mg | ORAL_TABLET | ORAL | Status: DC | PRN

## 2022-12-13 MED ORDER — VANCOMYCIN HCL 1000 MG IV SOLR
INTRAVENOUS | Status: DC | PRN
  Administered 2022-12-13: 1000 mg via TOPICAL

## 2022-12-13 MED ORDER — OXYCODONE HCL 5 MG PO TABS
5.0000 mg | ORAL_TABLET | Freq: Four times a day (QID) | ORAL | Status: DC | PRN
  Administered 2022-12-13: 5 mg via ORAL
  Filled 2022-12-13: qty 1

## 2022-12-13 MED ORDER — ROCURONIUM BROMIDE 10 MG/ML (PF) SYRINGE
PREFILLED_SYRINGE | INTRAVENOUS | Status: DC | PRN
  Administered 2022-12-13: 30 mg via INTRAVENOUS
  Administered 2022-12-13: 50 mg via INTRAVENOUS

## 2022-12-13 MED ORDER — METHOCARBAMOL 750 MG PO TABS
750.0000 mg | ORAL_TABLET | Freq: Three times a day (TID) | ORAL | Status: DC
  Administered 2022-12-13 – 2022-12-17 (×12): 750 mg via ORAL
  Filled 2022-12-13 (×12): qty 1

## 2022-12-13 MED ORDER — OXYCODONE HCL 5 MG PO TABS
2.5000 mg | ORAL_TABLET | Freq: Four times a day (QID) | ORAL | Status: DC | PRN
  Administered 2022-12-13: 2.5 mg via ORAL
  Filled 2022-12-13: qty 1

## 2022-12-13 MED ORDER — CEFAZOLIN SODIUM-DEXTROSE 2-4 GM/100ML-% IV SOLN
2.0000 g | INTRAVENOUS | Status: AC
  Administered 2022-12-13: 2 g via INTRAVENOUS

## 2022-12-13 MED ORDER — KETOROLAC TROMETHAMINE 30 MG/ML IJ SOLN
INTRAMUSCULAR | Status: DC | PRN
Start: 2022-12-13 — End: 2022-12-13
  Administered 2022-12-13: 30 mg via INTRAVENOUS

## 2022-12-13 MED ORDER — DEXAMETHASONE SODIUM PHOSPHATE 10 MG/ML IJ SOLN
INTRAMUSCULAR | Status: AC
  Filled 2022-12-13: qty 1

## 2022-12-13 MED ORDER — ALBUTEROL SULFATE (2.5 MG/3ML) 0.083% IN NEBU
2.5000 mg | INHALATION_SOLUTION | Freq: Four times a day (QID) | RESPIRATORY_TRACT | Status: DC
  Administered 2022-12-13 (×3): 2.5 mg via RESPIRATORY_TRACT
  Filled 2022-12-13 (×4): qty 3

## 2022-12-13 MED ORDER — SUGAMMADEX SODIUM 200 MG/2ML IV SOLN
INTRAVENOUS | Status: DC | PRN
  Administered 2022-12-13 (×2): 100 mg via INTRAVENOUS

## 2022-12-13 MED ORDER — SODIUM CHLORIDE 0.9% FLUSH
3.0000 mL | Freq: Two times a day (BID) | INTRAVENOUS | Status: DC
  Administered 2022-12-13 – 2022-12-17 (×6): 3 mL via INTRAVENOUS

## 2022-12-13 MED ORDER — VANCOMYCIN HCL 1000 MG IV SOLR
INTRAVENOUS | Status: AC
  Filled 2022-12-13: qty 20

## 2022-12-13 MED ORDER — CEFAZOLIN SODIUM-DEXTROSE 2-4 GM/100ML-% IV SOLN
2.0000 g | Freq: Three times a day (TID) | INTRAVENOUS | Status: AC
  Administered 2022-12-13 – 2022-12-14 (×3): 2 g via INTRAVENOUS
  Filled 2022-12-13 (×3): qty 100

## 2022-12-13 MED ORDER — ENOXAPARIN SODIUM 40 MG/0.4ML IJ SOSY
40.0000 mg | PREFILLED_SYRINGE | INTRAMUSCULAR | Status: DC
  Administered 2022-12-14 – 2022-12-17 (×3): 40 mg via SUBCUTANEOUS
  Filled 2022-12-13 (×4): qty 0.4

## 2022-12-13 MED ORDER — DEXAMETHASONE SODIUM PHOSPHATE 10 MG/ML IJ SOLN
INTRAMUSCULAR | Status: DC | PRN
  Administered 2022-12-13: 5 mg via INTRAVENOUS

## 2022-12-13 MED ORDER — LACTULOSE 10 GM/15ML PO SOLN
20.0000 g | Freq: Three times a day (TID) | ORAL | Status: DC
  Administered 2022-12-14 – 2022-12-17 (×4): 20 g via ORAL
  Filled 2022-12-13 (×7): qty 30

## 2022-12-13 SURGICAL SUPPLY — 86 items
APL PRP STRL LF DISP 70% ISPRP (MISCELLANEOUS) ×2
BAG COUNTER SPONGE SURGICOUNT (BAG) ×1 IMPLANT
BAG SPNG CNTER NS LX DISP (BAG) ×1
BANDAGE ESMARK 6X9 LF (GAUZE/BANDAGES/DRESSINGS) ×1 IMPLANT
BIT DRILL QC 2.5X240 (BIT) IMPLANT
BIT DRILL QC SFS 2.5X170 (BIT) IMPLANT
BIT DRILL QC SFS 2.8X200 (BIT) IMPLANT
BLADE CLIPPER SURG (BLADE) IMPLANT
BLADE SURG 15 STRL LF DISP TIS (BLADE) ×1 IMPLANT
BLADE SURG 15 STRL SS (BLADE) ×1
BNDG CMPR 5X4 KNIT ELC UNQ LF (GAUZE/BANDAGES/DRESSINGS) ×1
BNDG CMPR 6 X 5 YARDS HK CLSR (GAUZE/BANDAGES/DRESSINGS) ×1
BNDG CMPR 9X6 STRL LF SNTH (GAUZE/BANDAGES/DRESSINGS) ×1
BNDG ELASTIC 4INX 5YD STR LF (GAUZE/BANDAGES/DRESSINGS) IMPLANT
BNDG ELASTIC 4X5.8 VLCR STR LF (GAUZE/BANDAGES/DRESSINGS) ×1 IMPLANT
BNDG ELASTIC 6INX 5YD STR LF (GAUZE/BANDAGES/DRESSINGS) IMPLANT
BNDG ELASTIC 6X5.8 VLCR STR LF (GAUZE/BANDAGES/DRESSINGS) ×1 IMPLANT
BNDG ESMARK 6X9 LF (GAUZE/BANDAGES/DRESSINGS) ×1
BNDG GAUZE DERMACEA FLUFF 4 (GAUZE/BANDAGES/DRESSINGS) ×1 IMPLANT
BNDG GZE DERMACEA 4 6PLY (GAUZE/BANDAGES/DRESSINGS) ×1
BRUSH SCRUB EZ PLAIN DRY (MISCELLANEOUS) ×2 IMPLANT
CANISTER SUCT 3000ML PPV (MISCELLANEOUS) ×1 IMPLANT
CHLORAPREP W/TINT 26 (MISCELLANEOUS) ×2 IMPLANT
COVER SURGICAL LIGHT HANDLE (MISCELLANEOUS) ×1 IMPLANT
CUFF TOURN SGL QUICK 34 (TOURNIQUET CUFF) ×1
CUFF TRNQT CYL 34X4.125X (TOURNIQUET CUFF) ×1 IMPLANT
DRAPE C-ARM 42X72 X-RAY (DRAPES) ×1 IMPLANT
DRAPE C-ARMOR (DRAPES) ×1 IMPLANT
DRAPE ORTHO SPLIT 77X108 STRL (DRAPES) ×2
DRAPE SURG ORHT 6 SPLT 77X108 (DRAPES) ×2 IMPLANT
DRAPE U-SHAPE 47X51 STRL (DRAPES) ×1 IMPLANT
DRESSING MEPILEX FLEX 4X4 (GAUZE/BANDAGES/DRESSINGS) IMPLANT
DRSG MEPILEX FLEX 4X4 (GAUZE/BANDAGES/DRESSINGS) ×1
DRSG MEPILEX POST OP 4X8 (GAUZE/BANDAGES/DRESSINGS) IMPLANT
ELECT REM PT RETURN 9FT ADLT (ELECTROSURGICAL) ×1
ELECTRODE REM PT RTRN 9FT ADLT (ELECTROSURGICAL) ×1 IMPLANT
GAUZE PAD ABD 8X10 STRL (GAUZE/BANDAGES/DRESSINGS) ×2 IMPLANT
GAUZE SPONGE 4X4 12PLY STRL (GAUZE/BANDAGES/DRESSINGS) ×1 IMPLANT
GLOVE BIO SURGEON STRL SZ 6.5 (GLOVE) ×3 IMPLANT
GLOVE BIO SURGEON STRL SZ7.5 (GLOVE) ×4 IMPLANT
GLOVE BIOGEL PI IND STRL 6.5 (GLOVE) ×1 IMPLANT
GLOVE BIOGEL PI IND STRL 7.5 (GLOVE) ×1 IMPLANT
GOWN STRL REUS W/ TWL LRG LVL3 (GOWN DISPOSABLE) ×2 IMPLANT
GOWN STRL REUS W/TWL LRG LVL3 (GOWN DISPOSABLE) ×2
IMMOBILIZER KNEE 22 UNIV (SOFTGOODS) ×1 IMPLANT
K-WIRE 1.6X150 (WIRE) ×1
KIT BASIN OR (CUSTOM PROCEDURE TRAY) ×1 IMPLANT
KIT TURNOVER KIT B (KITS) ×1 IMPLANT
KWIRE 1.6X150 (WIRE) IMPLANT
NDL 1/2 CIR MAYO (NEEDLE) IMPLANT
NDL SUT 6 .5 CRC .975X.05 MAYO (NEEDLE) ×1 IMPLANT
NEEDLE 1/2 CIR MAYO (NEEDLE) ×1 IMPLANT
NEEDLE MAYO TAPER (NEEDLE) ×1
NS IRRIG 1000ML POUR BTL (IV SOLUTION) ×1 IMPLANT
PACK TOTAL JOINT (CUSTOM PROCEDURE TRAY) ×1 IMPLANT
PAD ABD 8X10 STRL (GAUZE/BANDAGES/DRESSINGS) IMPLANT
PAD ARMBOARD 7.5X6 YLW CONV (MISCELLANEOUS) ×2 IMPLANT
PAD CAST 4YDX4 CTTN HI CHSV (CAST SUPPLIES) ×1 IMPLANT
PADDING CAST COTTON 4X4 STRL (CAST SUPPLIES) ×1
PADDING CAST COTTON 6X4 STRL (CAST SUPPLIES) ×1 IMPLANT
PLATE LOCK VA-LCP F/3.5X117 6H (Plate) IMPLANT
SCREW CORTEX 3.5 45MM (Screw) IMPLANT
SCREW CORTEX 3.5 55MM (Screw) ×3 IMPLANT
SCREW CORTEX 3.5X90 (Screw) IMPLANT
SCREW LOCK 3.5X85 (Screw) IMPLANT
SCREW LOCK CORT ST 3.5X40 (Screw) IMPLANT
SCREW LOCKING 3.5X80MM VA (Screw) IMPLANT
SCREW LOCKING 3.5X90MM VA (Screw) IMPLANT
STAPLER VISISTAT 35W (STAPLE) ×1 IMPLANT
SUCTION TUBE FRAZIER 10FR DISP (SUCTIONS) ×1 IMPLANT
SUT ETHILON 2 0 FS 18 (SUTURE) ×1 IMPLANT
SUT ETHILON 3 0 FSL (SUTURE) IMPLANT
SUT ETHILON 3 0 PS 1 (SUTURE) IMPLANT
SUT FIBERWIRE #2 38 T-5 BLUE (SUTURE)
SUT VIC AB 0 CT1 27 (SUTURE)
SUT VIC AB 0 CT1 27XBRD ANBCTR (SUTURE) IMPLANT
SUT VIC AB 1 CT1 18XCR BRD 8 (SUTURE) IMPLANT
SUT VIC AB 1 CT1 27 (SUTURE) ×1
SUT VIC AB 1 CT1 27XBRD ANBCTR (SUTURE) ×1 IMPLANT
SUT VIC AB 1 CT1 8-18 (SUTURE)
SUT VIC AB 2-0 CT1 27 (SUTURE) ×2
SUT VIC AB 2-0 CT1 TAPERPNT 27 (SUTURE) ×2 IMPLANT
SUTURE FIBERWR #2 38 T-5 BLUE (SUTURE) IMPLANT
TOWEL GREEN STERILE (TOWEL DISPOSABLE) ×2 IMPLANT
TRAY FOLEY MTR SLVR 16FR STAT (SET/KITS/TRAYS/PACK) IMPLANT
WATER STERILE IRR 1000ML POUR (IV SOLUTION) ×2 IMPLANT

## 2022-12-13 NOTE — Anesthesia Postprocedure Evaluation (Signed)
Anesthesia Post Note  Patient: Jonathan Ibarra  Procedure(s) Performed: OPEN REDUCTION INTERNAL FIXATION (ORIF) TIBIAL PLATEAU (Right: Leg Upper)     Patient location during evaluation: PACU Anesthesia Type: General Level of consciousness: awake and alert Pain management: pain level controlled Vital Signs Assessment: post-procedure vital signs reviewed and stable Respiratory status: spontaneous breathing, nonlabored ventilation, respiratory function stable and patient connected to nasal cannula oxygen Cardiovascular status: blood pressure returned to baseline and stable Postop Assessment: no apparent nausea or vomiting Anesthetic complications: no  No notable events documented.  Last Vitals:  Vitals:   12/13/22 1500 12/13/22 1517  BP: (!) 156/81 (!) 150/80  Pulse: 74 72  Resp: 18 18  Temp: 36.4 C 36.5 C  SpO2: 100% 100%    Last Pain:  Vitals:   12/13/22 1517  TempSrc: Oral  PainSc:                  Kennieth Rad

## 2022-12-13 NOTE — TOC CAGE-AID Note (Signed)
Transition of Care [facility]) - CAGE-AID Screening   Patient Details  Name: Jonathan Ibarra MRN: [id number] Date of Birth: 02-09-1966  Transition of Care [facility]) CM/SW Contact:    Katha Hamming, RN Phone Number: 12/13/2022, 10:05 AM   Clinical Narrative:  Declined screening at this time, falling asleep mind conversation. Reports drug use, denies alcohol use.  CAGE-AID Screening: Substance Abuse Screening unable to be completed due to: : Patient Refused

## 2022-12-13 NOTE — Anesthesia Preprocedure Evaluation (Signed)
Anesthesia Evaluation  Patient identified by MRN, date of birth, ID bandGeneral Assessment Comment:Somnolent   Reviewed: Allergy & Precautions, NPO status , Patient's Chart, lab work & pertinent test results  History of Anesthesia Complications Negative for: history of anesthetic complications  Airway Mallampati: Unable to assess  TM Distance: >3 FB Neck ROM: Full    Dental  (+) Poor Dentition, Missing, Dental Advisory Given   Pulmonary neg shortness of breath, neg COPD, neg recent URI, Current Smoker   breath sounds clear to auscultation       Cardiovascular hypertension, Pt. on medications and Pt. on home beta blockers (-) angina + dysrhythmias Atrial Fibrillation  Rhythm:Regular  1. Left ventricular ejection fraction, by estimation, is 55 to 60%. The  left ventricle has normal function. The left ventricle has no regional  wall motion abnormalities. Left ventricular diastolic parameters are  indeterminate.   2. Right ventricular systolic function is normal. The right ventricular  size is normal. Tricuspid regurgitation signal is inadequate for assessing  PA pressure.   3. Left atrial size was mildly dilated.   4. Right atrial size was mildly dilated.   5. The mitral valve is normal in structure. No evidence of mitral valve  regurgitation. No evidence of mitral stenosis.   6. The aortic valve is tricuspid. There is mild calcification of the  aortic valve. Aortic valve regurgitation is not visualized. No aortic  stenosis is present.   7. The inferior vena cava is dilated in size with >50% respiratory  variability, suggesting right atrial pressure of 8 mmHg.   8. The patient was in atrial fibrillation.     Neuro/Psych negative neurological ROS  negative psych ROS   GI/Hepatic negative GI ROS,,,(+)     substance abuse  marijuana use and methamphetamine useDenies use today or yesterday   Endo/Other  negative endocrine ROS     Renal/GU negative Renal ROS     Musculoskeletal Right tibial plateau fx   Abdominal   Peds  Hematology negative hematology ROS (+) Lab Results      Component                Value               Date                      WBC                      7.7                 12/13/2022                HGB                      13.7                12/13/2022                HCT                      41.2                12/13/2022                MCV                      92.0  12/13/2022                PLT                      184                 12/13/2022              Anesthesia Other Findings   Reproductive/Obstetrics                              Anesthesia Physical Anesthesia Plan  ASA: 3  Anesthesia Plan: General   Post-op Pain Management: Ketamine IV*   Induction: Intravenous and Rapid sequence  PONV Risk Score and Plan: 1 and Ondansetron and Dexamethasone  Airway Management Planned: Oral ETT and Video Laryngoscope Planned  Additional Equipment: None  Intra-op Plan:   Post-operative Plan: Extubation in OR  Informed Consent: I have reviewed the patients History and Physical, chart, labs and discussed the procedure including the risks, benefits and alternatives for the proposed anesthesia with the patient or authorized representative who has indicated his/her understanding and acceptance.     Dental advisory given  Plan Discussed with: CRNA  Anesthesia Plan Comments:          Anesthesia Quick Evaluation

## 2022-12-13 NOTE — BH Assessment (Signed)
Per RN, Pt has received medication and is currently too somnolent to participate in tele-assessment.   Pamalee Leyden, [facility], [facility] Triage Specialist
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
## 2022-12-13 NOTE — ED Notes (Signed)
PT had a pain level stated of above 10 in his knee and was crying.Pain meds administered.

## 2022-12-13 NOTE — Consult Note (Addendum)
Physician: Hyman Hopes Shalawn Wynder  Consulting Service: Trauma Surgery  CC: Fall  Subjective   Mechanism of Injury: Jonathan Ibarra is an 57 y.o. male who presented as a level 2 trauma after a fall off bridge - suspicious appearance at scene per EMS, did not appear to be a fall as described by patient.  Past Medical History:  Diagnosis Date   Alcohol abuse    Cirrhosis of liver (HCC)    Hypertension     No past surgical history on file.  No family history on file.  Social:  reports that he has been smoking. He has never used smokeless tobacco. He reports current drug use. Drugs: Cocaine and Marijuana. He reports that he does not drink alcohol.  Allergies:  Allergies  Allergen Reactions   Tylenol [Acetaminophen] Other (See Comments)    "Not good for liver"    Medications: Current Outpatient Medications  Medication Instructions   Constulose 20 g, Oral, 2 times daily   furosemide (LASIX) 40 mg, Oral, Daily   metoprolol tartrate (LOPRESSOR) 25 mg, Oral, 2 times daily   oxyCODONE (OXY IR/ROXICODONE) 5 mg, Oral, Every 6 hours PRN   spironolactone (ALDACTONE) 25 mg, Oral, Daily    Objective   Primary Survey: Blood pressure (!) 166/96, pulse (!) 104, temperature (!) 97.5 F (36.4 C), temperature source Oral, resp. rate 18, SpO2 100%. Airway: Patent, protecting airway Breathing: Bilateral breath sounds, breathing spontaneously Circulation: Stable, Palpable peripheral pulses Disability: R Knee disability,   GCS Eyes: 4 - Eyes open spontaneously  GCS Verbal: 5 - Oriented  GCS Motor: 6 - Obeys commands for movement  GCS 15 Environment/Exposure: Warm, dry  Secondary Survey: Head: Normocephalic, atraumatic Neck: Full range of motion without pain, no midline tenderness Chest: Bilateral breath sounds, chest wall stable but tender Abdomen: Soft, non-tender, non-distended Upper Extremities: Strength and sensation intact, palpable peripheral pulses Lower extremities:  Strength and sensation intact, palpable peripheral pulses, other than right knee Back: No step offs or deformities, atraumatic Rectal: Deferred Psych: Normal mood and affect   Results for orders placed or performed during the hospital encounter of 12/12/22 (from the past 24 hour(s))  Sample to Blood Bank     Status: None   Collection Time: 12/12/22 11:00 PM  Result Value Ref Range   Blood Bank Specimen SAMPLE AVAILABLE FOR TESTING    Sample Expiration      12/15/2022,2359 Performed at [facility], [address] [place], [place] [address]   Acetaminophen level     Status: Abnormal   Collection Time: 12/12/22 11:07 PM  Result Value Ref Range   Acetaminophen (Tylenol), Serum <10 (L) 10 - 30 ug/mL  Salicylate level     Status: Abnormal   Collection Time: 12/12/22 11:07 PM  Result Value Ref Range   Salicylate Lvl <7.0 (L) 7.0 - 30.0 mg/dL  Comprehensive metabolic panel     Status: Abnormal   Collection Time: 12/12/22 11:10 PM  Result Value Ref Range   Sodium 137 135 - 145 mmol/L   Potassium 4.0 3.5 - 5.1 mmol/L   Chloride 102 98 - 111 mmol/L   CO2 24 22 - 32 mmol/L   Glucose, Bld 102 (H) 70 - 99 mg/dL   BUN 16 6 - 20 mg/dL   Creatinine, Ser 6.04 0.61 - 1.24 mg/dL   Calcium 9.3 8.9 - 54.0 mg/dL   Total Protein 7.5 6.5 - 8.1 g/dL   Albumin 2.9 (L) 3.5 - 5.0 g/dL   AST  26 15 - 41 U/L   ALT 19 0 - 44 U/L   Alkaline Phosphatase 127 (H) 38 - 126 U/L   Total Bilirubin 2.6 (H) 0.3 - 1.2 mg/dL   GFR, Estimated >16 >10 mL/min   Anion gap 11 5 - 15  CBC     Status: None   Collection Time: 12/12/22 11:10 PM  Result Value Ref Range   WBC 7.6 4.0 - 10.5 K/uL   RBC 4.23 4.22 - 5.81 MIL/uL   Hemoglobin 13.3 13.0 - 17.0 g/dL   HCT 96.0 45.4 - 09.8 %   MCV 93.1 80.0 - 100.0 fL   MCH 31.4 26.0 - 34.0 pg   MCHC 33.8 30.0 - 36.0 g/dL   RDW 11.9 14.7 - 82.9 %   Platelets 184 150 - 400 K/uL   nRBC 0.0 0.0 - 0.2 %  Ethanol     Status: None   Collection Time: 12/12/22 11:10 PM   Result Value Ref Range   Alcohol, Ethyl (B) <10 <10 mg/dL  Protime-INR     Status: Abnormal   Collection Time: 12/12/22 11:10 PM  Result Value Ref Range   Prothrombin Time 15.6 (H) 11.4 - 15.2 seconds   INR 1.2 0.8 - 1.2  CK     Status: None   Collection Time: 12/12/22 11:10 PM  Result Value Ref Range   Total CK 76 49 - 397 U/L  I-Stat Chem 8, ED     Status: Abnormal   Collection Time: 12/12/22 11:11 PM  Result Value Ref Range   Sodium 138 135 - 145 mmol/L   Potassium 4.0 3.5 - 5.1 mmol/L   Chloride 104 98 - 111 mmol/L   BUN 17 6 - 20 mg/dL   Creatinine, Ser 5.62 0.61 - 1.24 mg/dL   Glucose, Bld 130 (H) 70 - 99 mg/dL   Calcium, Ion 8.65 7.84 - 1.40 mmol/L   TCO2 24 22 - 32 mmol/L   Hemoglobin 12.6 (L) 13.0 - 17.0 g/dL   HCT 69.6 (L) 29.5 - 28.4 %  I-Stat Lactic Acid, ED     Status: None   Collection Time: 12/12/22 11:11 PM  Result Value Ref Range   Lactic Acid, Venous 1.5 0.5 - 1.9 mmol/L     Imaging Orders         DG Chest Port 1 View         DG Pelvis Portable         CT HEAD WO CONTRAST         CT CERVICAL SPINE WO CONTRAST         CT CHEST ABDOMEN PELVIS W CONTRAST         DG Forearm Left         DG Tibia/Fibula Right Port         CT L-SPINE NO CHARGE         CT T-SPINE NO CHARGE         CT Knee Right Wo Contrast         DG FEMUR PORT, MIN 2 VIEWS RIGHT      Assessment and Plan   Jonathan Ibarra is an 57 y.o. male who presented as a level 2 trauma after a fall off a bridge.  Injuries: Right tibial fracture - Dr. Charlann Boxer consulted by ER provider Right lateral 8th and 9th rib fractures - pain control pulmonary toilet  Cirrhosis, history of TIPS, recent admission to medical service due to decompensated liver failure.  Will require admission to the medical service with preoperative optimization prior to surgery.  Psych consulted by ER provider due to concern for possible suicide attempt  Quentin Ore, MD  [facility] Use  [email or web address] to contact on call provider  New Patient Billing: [id number] - High MDM   I reviewed images listed above and relied on radiology reads to assist with interpretation.  I reviewed labs, vitals.  I reviewed recent discharge summary from 10/08/2022, describing recent admission for decompensated liver failure.

## 2022-12-13 NOTE — Op Note (Addendum)
Orthopaedic Surgery Operative Note (CSN: [id number] ) Date of Surgery: 12/13/2022  Admit Date: 12/12/2022   Diagnoses: Pre-Op Diagnoses: Right bicondylar tibial plateau fracture  Post-Op Diagnosis: Same  Procedures: CPT 27536-Open reduction internal fixation of right tibial plateau fracture CPT 27403-Repair of right lateral mensicus tear  Surgeons : Primary: Roby Lofts, MD  Assistant: Darron Doom, RNFA  Location: OR 3   Anesthesia: General   Antibiotics: Ancef 2g preop with 1 gm vancomycin powder placed topically   Tourniquet time: None    Estimated Blood Loss: 50 mL  Complications: None  Specimens:* No specimens in log *   Implants: Implant Name Type Inv. Item Serial No. Manufacturer Lot No. LRB No. Used Action  PLATE LOCK VA-LCP F/3.5X117 6H - [id number] Plate PLATE LOCK VA-LCP F/3.5X117 6H  [employer]  Right 1 Implanted  SCREW LOCK 3.5X85 - [id number] Screw SCREW LOCK 3.5X85  [employer]  Right 2 Implanted  SCREW CORTEX 3.5X90 - [id number] Screw SCREW CORTEX 3.5X90  [employer]  Right 1 Implanted  SCREW CORTEX 3.5 - [id number] Screw SCREW CORTEX 3.5  [employer]  Right 1 Implanted  SCREW LOCKING 3.5X80MM VA - [id number] Screw SCREW LOCKING 3.5X80MM VA  [employer]  Right 1 Implanted  SCREW LOCKING 3.5X90MM VA - [id number] Screw SCREW LOCKING 3.5X90MM VA  [employer]  Right 3 Implanted  SCREW LOCK CORT ST 3.5X40 - [id number] Screw SCREW LOCK CORT ST 3.5X40  [employer]  Right 1 Implanted  SCREW CORTEX 3.5 - [id number] Screw SCREW CORTEX 3.5  [employer]  Right 1 Implanted     Indications for Surgery: 57 year old male who fell from a height sustaining a right bicondylar tibial plateau fracture.  Due to the unstable nature of his injury I recommend proceeding with open reduction internal fixation.  Risks and benefits were discussed with the patient.  Risks include but not limited to  bleeding, infection, malunion, nonunion, hardware failure, hardware irritation, nerve and blood vessel injury, DVT, posttraumatic arthritis, knee stiffness, even the possibility anesthetic complications.  He agreed to proceed with surgery and consent was obtained.  Operative Findings: Open reduction internal fixation of right bicondylar tibial plateau fracture using Synthes VA 3.5 mm proximal tibial locking plate. Repair of peripheral lateral mensicus tear with #2 Fiberwire sutures  Procedure: The patient was identified in the preoperative holding area. Consent was confirmed with the patient and their family and all questions were answered. The operative extremity was marked after confirmation with the patient. he was then brought back to the operating room by our anesthesia colleagues.  He was placed under general anesthetic and carefully transferred over to radiolucent flattop table.  A bump was placed under his operative hip.  A nonsterile tourniquet was placed to his upper thigh.  The right lower extremity was then prepped and draped in usual sterile fashion.  A timeout was performed to verify the patient, the procedure, and the extremity.  Preoperative antibiotics were dosed.  The tourniquet was inflated to 250 mmHg.  Total tourniquet time as noted above.  The hip and knee were flexed over a triangle.  A standard lateral parapatellar incision was then made and carried down through skin and subcutaneous tissue.  I incised through the IT band and developed the plane between the capsule and the IT band proximally.  Distally I released the IT band off of the lateral condyle of the tibia.  I then performed a submeniscal arthrotomy and tagged the capsule with 0 Vicryl  suture for later repair.  I then visualized the impacted articular surface of the lateral condyle.  I also found that he had a peripheral meniscus tear.  Using a #2 FiberWire I used a horizontal mattress suture through the capsule and through  the meniscus and tagged these for later repair as well.  I then entered the anterior lateral split and used a Cobb elevator and a tamp to reduce the articular surface.  I held it provisionally with K wires.  The medial side was nondisplaced and had not shifted or moved on the radiographs.  As result I then was able to reduce the lateral split back anatomically and drilled and placed a 3.5 millimeter screw to hold the condyles together.  I then slid a Synthes 3.5 mm VA proximal tibial locking plate along the lateral cortex of the tibia.  I held it provisionally with a K wire proximally.  I then drilled and placed a nonlocking screw proximally to bring the plate flush to bone.  I then percutaneously placed 3.5 millimeter screws into the tibial shaft to bring the distal portion of the plate flush to bone.  I then proceeded to place 3.5 mm locking screws in the proximal segment to raft the disimpacted articular surface.  I then placed another nonlocking screw into the tibial shaft.  Final fluoroscopic imaging was obtained.  The incision was copiously irrigated.  The tag sutures for the capsule were brought through the plate and tied down.  I then tied down the meniscus repair sutures.  I then placed a gram of vancomycin powder.  I then closed the IT band with 0 Vicryl suture.  The skin was closed with 2-0 Monocryl and 3-0 nylon.  Sterile dressings were applied.  The patient was then awoken from anesthesia and taken to the PACU in stable condition.  Post Op Plan/Instructions: Patient will be nonweightbearing to the right lower extremity.  He will receive postoperative Ancef.  He be placed on Lovenox for DVT prophylaxis.  We will have her mobilize with physical and Occupational Therapy.  Have no range of motion restrictions to the knee.  I was present and performed the entire surgery.  Truitt Merle, MD [facility]

## 2022-12-13 NOTE — Progress Notes (Signed)
Orthopedic Tech Progress Note Patient Details:  Jonathan Ibarra [birth year] [id number]  [name], male   DOB: [birth year], 57 y.o.   MRN: [id number] I attended trauma page. Jonathan Ibarra 12/13/2022, 12:23 AM
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
## 2022-12-13 NOTE — Plan of Care (Signed)

## 2022-12-13 NOTE — H&P (Addendum)
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 number that you can be directly reached. If you still have difficulty reaching the provider, please page the [facility] (Director on Call) for the Hospitalists listed on amion for assistance.  12/13/2022, 6:35 AM
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
## 2022-12-13 NOTE — Transfer of Care (Signed)
Immediate Anesthesia Transfer of Care Note  Patient: Jonathan Ibarra  Procedure(s) Performed: OPEN REDUCTION INTERNAL FIXATION (ORIF) TIBIAL PLATEAU (Right: Leg Upper)  Patient Location: PACU  Anesthesia Type:General  Level of Consciousness: drowsy and patient cooperative  Airway & Oxygen Therapy: Patient Spontanous Breathing  Post-op Assessment: Report given to RN and Post -op Vital signs reviewed and stable  Post vital signs: Reviewed and stable  Last Vitals:  Vitals Value Taken Time  BP 145/80 12/13/22 1410  Temp    Pulse 78 12/13/22 1412  Resp 16 12/13/22 1412  SpO2 90 % 12/13/22 1412  Vitals shown include unfiled device data.  Last Pain:  Vitals:   12/13/22 1203  TempSrc: Oral  PainSc:          Complications: No notable events documented.

## 2022-12-13 NOTE — Interval H&P Note (Signed)
History and Physical Interval Note:  12/13/2022 11:51 AM  Jonathan Ibarra  has presented today for surgery, with the diagnosis of Right tibial plateau fracture.  The various methods of treatment have been discussed with the patient and family. After consideration of risks, benefits and other options for treatment, the patient has consented to  Procedure(s): OPEN REDUCTION INTERNAL FIXATION (ORIF) TIBIAL PLATEAU (Right) as a surgical intervention.  The patient's history has been reviewed, patient examined, no change in status, stable for surgery.  I have reviewed the patient's chart and labs.  Questions were answered to the patient's satisfaction.     Caryn Bee P Alwaleed Obeso

## 2022-12-13 NOTE — H&P (Deleted)
Error

## 2022-12-13 NOTE — Progress Notes (Signed)
Orthopedic Tech Progress Note Patient Details:  Jonathan Ibarra [birth year] [id number]  Ortho Devices Type of Ortho Device: Knee Immobilizer Ortho Device/Splint Location: rle Ortho Device/Splint Interventions: Ordered, Application, Adjustment  I applied the brace with a nurses help to hold the leg. Post Interventions Patient Tolerated: Well Instructions Provided: Care of device, Adjustment of device  Trinna Post 12/13/2022, 3:48 AM

## 2022-12-13 NOTE — Consult Note (Signed)
Brief Psychiatry Consult Note  I was able to see pt briefly in short stay before orthopedic procedure. Thankfully he was in a private room, not an open bay; unfortunately he had recently received pre-procedural dilaudid. He immediately denied that this was a suicide attempt after I introduced myself (which has been c/w pt report since admission - spoke to ED nurse and reviewed documentation including EMS runsheet with bystander testimony). Will see for full consult tomorrow. As pt has been consistently denying SI since admission, has not displayed dangerous agitation (no PRNs), no prior suicide attempts or psych hospitalizations, and will be in surgery until ~3 will defer full consult to tomorrow. Reasonable to leave w/o sitter at this time based on above.   Rani Idler A Rhonda Vangieson

## 2022-12-13 NOTE — Progress Notes (Addendum)
CCC Pre-op Review  Pre-op checklist: To be completed by Pre-op  NPO: NPO sips with meds  Labs: CA 8.6, PCR ordered not collected  Consent: Ordered  H&P: by hospitalist  Vitals: BP elevated-BB given 0915  O2 requirements: RA  MAR/PTA review: Done. On BB. No anticoags. No GLPs  IV: 18G LUE  Floor nurse name:  ED Nurse no one assigned  Additional info:  Suicide precautions Ancef 2g Betadine

## 2022-12-14 DIAGNOSIS — W131XXA Fall from, out of or through bridge, initial encounter: Secondary | ICD-10-CM | POA: Diagnosis not present

## 2022-12-14 DIAGNOSIS — R45851 Suicidal ideations: Secondary | ICD-10-CM | POA: Diagnosis not present

## 2022-12-14 DIAGNOSIS — I10 Essential (primary) hypertension: Secondary | ICD-10-CM | POA: Diagnosis not present

## 2022-12-14 LAB — CBC
HCT: 34.6 % — ABNORMAL LOW (ref 39.0–52.0)
Hemoglobin: 11.9 g/dL — ABNORMAL LOW (ref 13.0–17.0)
MCH: 31.9 pg (ref 26.0–34.0)
MCHC: 34.4 g/dL (ref 30.0–36.0)
MCV: 92.8 fL (ref 80.0–100.0)
Platelets: 182 10*3/uL (ref 150–400)
RBC: 3.73 MIL/uL — ABNORMAL LOW (ref 4.22–5.81)
RDW: 14.3 % (ref 11.5–15.5)
WBC: 10 10*3/uL (ref 4.0–10.5)
nRBC: 0 % (ref 0.0–0.2)

## 2022-12-14 LAB — BASIC METABOLIC PANEL
Anion gap: 5 (ref 5–15)
BUN: 17 mg/dL (ref 6–20)
CO2: 25 mmol/L (ref 22–32)
Calcium: 8.3 mg/dL — ABNORMAL LOW (ref 8.9–10.3)
Chloride: 102 mmol/L (ref 98–111)
Creatinine, Ser: 0.93 mg/dL (ref 0.61–1.24)
GFR, Estimated: >60 mL/min (ref >60)
Glucose, Bld: 134 mg/dL — ABNORMAL HIGH (ref 70–99)
Potassium: 4.6 mmol/L (ref 3.5–5.1)
Sodium: 132 mmol/L — ABNORMAL LOW (ref 135–145)

## 2022-12-14 LAB — HEPATITIS B SURFACE ANTIBODY,QUALITATIVE: Hep B S Ab: NONREACTIVE

## 2022-12-14 LAB — HEPATITIS B SURFACE ANTIGEN: Hepatitis B Surface Ag: NONREACTIVE

## 2022-12-14 LAB — HEPATITIS B CORE ANTIBODY, TOTAL: Hep B Core Total Ab: NONREACTIVE

## 2022-12-14 LAB — HIV ANTIBODY (ROUTINE TESTING W REFLEX): HIV Screen 4th Generation wRfx: NONREACTIVE

## 2022-12-14 MED ORDER — ALBUTEROL SULFATE (2.5 MG/3ML) 0.083% IN NEBU
2.5000 mg | INHALATION_SOLUTION | Freq: Two times a day (BID) | RESPIRATORY_TRACT | Status: DC
  Filled 2022-12-14 (×2): qty 3

## 2022-12-14 NOTE — Consult Note (Signed)
[facility] Psychiatry New Face-to-Face Psychiatric Evaluation   Service Date: December 14, 2022 LOS:  LOS: 1 day    Assessment  Jonathan Ibarra is a 57 y.o. male admitted medically for 12/12/2022 10:52 PM for a level 2 trauma after falling 20 feet reportedly from a bridge. He carries no past psychiatric diagnoses but has a past medical history of  polysubstance use disorder, decompensated cirrhosis s/p TIPS with HE, A-fib not on coagulation, HTN, and seizure disorder. Psychiatry was consulted for concern of accidental fall versus SA by hospitalist.    Patient's presentation of mild intermittent depressive symptoms with underlying polysubstance abuse and the unexpected death of his daughter (63 yo, MVA) 2 years prior is likely consistent with previously diagnosed depression without SI. This is based off the patient repeatedly endorsing to EMS, nursing, hospital staff, and his sitter that he was walking in the woods with someone else when he accidentally slipped and fell (with witnesses per EMS run sheet), but has not been and is not currently experiencing SI. He has no current outpatient psychotropic medications and declines rehab resources at this time. Patient is agreeable to outpatient therapy, but does not feel like he needs antidepressants. It is our assessment that he is not a threat to himself or others at this time and is appropriate for outpatient therapy for his chronic depression and appropriate grief. Please see plan below for detailed recommendations.   Diagnoses:  Active Hospital problems: Principal Problem:   Fall from bridge, initial encounter     Plan  ## Safety and Observation Level:  - Based on my clinical evaluation, I estimate the patient to be at low risk of self harm in the current setting - At this time, we recommend a routine level of observation and will discontinue 1:1. This decision is based on my review of the chart including patient's history and current  presentation, interview of the patient, mental status examination, and consideration of suicide risk including evaluating suicidal ideation, plan, intent, suicidal or self-harm behaviors, risk factors, and protective factors. This judgment is based on our ability to directly address suicide risk, implement suicide prevention strategies and develop a safety plan while the patient is in the clinical setting. Please contact our team if there is a concern that risk level has changed.   ## Medications:  -- No psychiatric medications   ## Medical Decision Making Capacity:  Did not formally assess  ## Further Work-up:  -- per primary -- most recent EKG on 12/14/2022 had QtC of 491 -- Pertinent labwork reviewed earlier this admission includes: UDS positive for amphetamines and THC. BAL < 10. Salicylate level < 7.   ## Disposition:  -- Discharge home with appropriate outpatient therapy resources for chronic depression, grief, and polysubstance use  ## Behavioral / Environmental:  --  Utilize compassion and acknowledge the patient's experiences while setting clear and realistic expectations for care.   ##Legal Status Voluntary  Thank you for this consult request. Recommendations have been communicated to the primary team.  We will sign off at this time.   Carolan Shiver, Medical Student   Other History  Relevant Aspects of Hospital Course:  Admitted on 12/12/2022 for  a level 2 trauma after falling 20 feet reportedly from a bridge. Sustained traumatic injuries including R 8th, 9th rib fracture, and R proximal tibial fracture comminuted with intraarticular extension.  Patient Report:   12/14/2022 Pt seen at bedside this AM. Pt reports that he is frustrated with psychiatry's involvement  in his care and the fact that he has repeatedly told multiple providers that he did not intend to kill himself. When questioned about depression, he stated "anyone who says they aren't depressed is lying".  He admitted that he has been diagnosed with depression in the past, but this was about 30 years ago. States he went and saw someone when he needed help and that "my life was in a bad place then." He has had Intermittent depression with no SI since then. No hx attempts or NSSIB. He denies any SH, AVH, or current HI. He states his reason for living is "to try and change my life around." He reports that his daughter (56 yo) passed away unexpectedly after an MVA 2 years ago. He became tearful discussing this, but recovered his composure quickly afterwards.   ROS:  Depression: chronic intermittent dysphoria, denies active or passive SI Psychosis: denies past or present AVH, was branded "not crazy or psychotic" and discharged after his inpatient psychiatric hospitalization 30 years ago   Collateral information:  Spoke to nurse. Pt has been "cutting up and laughing."  Spoke to sitter who confirms patient has been in good spirits and has not mentioned any SI.   Psychiatric History:  Past Dx: MDD 30 years ago Psychiatric Hospitalization: once 30 years ago during his initial depressive episode, somewhere in [place] Previous medications: celexa and something else during this initial encounter, but he stopped taking it due to feeling like he didn't need it Current psych medications: none Therapist: none Current psychiatrist: none Rehab: once 30 years ago, felt it was not helpful   Social History:   Tobacco use: smoker Alcohol use: denies current alcohol use, BAL < 10, history of alcohol use disorder with cirrhosis Drug use: Methamphetamine, last use reported 2 days ago, cocaine and marijuana use, UDS positive for amphetamine and THC No access to firearms (convicted felon -- not allowed to own guns)  Family History:   The patient's family history is not on file.  Medical History: Past Medical History:  Diagnosis Date   Alcohol abuse    Cirrhosis of liver (HCC)    Hypertension      Surgical History: Past Surgical History:  Procedure Laterality Date   CHOLECYSTECTOMY     IR TIPS      Medications:   Current Facility-Administered Medications:    acetaminophen (TYLENOL) tablet 650 mg, 650 mg, Oral, Q6H PRN, Haddix, Gillie Manners, MD   albuterol (PROVENTIL) (2.5 MG/3ML) 0.083% nebulizer solution 2.5 mg, 2.5 mg, Nebulization, BID, Adhikari, Amrit, MD   enoxaparin (LOVENOX) injection 40 mg, 40 mg, Subcutaneous, Q24H, Haddix, Gillie Manners, MD, 40 mg at 12/14/22 0908   folic acid (FOLVITE) tablet 1 mg, 1 mg, Oral, Daily, Haddix, Gillie Manners, MD, 1 mg at 12/14/22 0905   HYDROmorphone (DILAUDID) injection 0.5 mg, 0.5 mg, Intravenous, Q4H PRN, Haddix, Gillie Manners, MD, 0.5 mg at 12/13/22 0940   ketorolac (TORADOL) 15 MG/ML injection 30 mg, 30 mg, Intravenous, Q6H, Haddix, Gillie Manners, MD, 30 mg at 12/14/22 1228   lactulose (CHRONULAC) 10 GM/15ML solution 20 g, 20 g, Oral, TID, Haddix, Gillie Manners, MD   methocarbamol (ROBAXIN) tablet 750 mg, 750 mg, Oral, Q8H, Haddix, Gillie Manners, MD, 750 mg at 12/14/22 0610   metoprolol tartrate (LOPRESSOR) tablet 25 mg, 25 mg, Oral, BID, Haddix, Gillie Manners, MD, 25 mg at 12/14/22 0905   multivitamin with minerals tablet 1 tablet, 1 tablet, Oral, Daily, Haddix, Gillie Manners, MD, 1 tablet at 12/14/22 [phone]  mupirocin ointment (BACTROBAN) 2 %, , Nasal, BID, Burnadette Pop, MD, Given at 12/14/22 0908   oxyCODONE (Oxy IR/ROXICODONE) immediate release tablet 5 mg, 5 mg, Oral, Q4H PRN **OR** oxyCODONE (Oxy IR/ROXICODONE) immediate release tablet 10 mg, 10 mg, Oral, Q4H PRN, Haddix, Gillie Manners, MD, 10 mg at 12/14/22 0911   sodium chloride flush (NS) 0.9 % injection 3 mL, 3 mL, Intravenous, Q12H, Haddix, Gillie Manners, MD, 3 mL at 12/13/22 2212   thiamine (VITAMIN B1) tablet 100 mg, 100 mg, Oral, Daily, 100 mg at 12/14/22 0905 **OR** thiamine (VITAMIN B1) injection 100 mg, 100 mg, Intravenous, Daily, Haddix, Gillie Manners, MD  Allergies: Allergies  Allergen Reactions   Tylenol [Acetaminophen] Other  (See Comments)    "Not good for liver"       Objective  Vital signs:  Temp:  [97.6 F (36.4 C)-98.5 F (36.9 C)] 98.5 F (36.9 C) (10/14 1957) Pulse Rate:  [72-86] 81 (10/15 0410) Resp:  [13-19] 19 (10/14 1957) BP: (102-158)/(64-90) 102/64 (10/15 0410) SpO2:  [94 %-100 %] 95 % (10/15 0410)  Psychiatric Specialty Exam:  Presentation  General Appearance: Appropriate for Environment  Eye Contact:Fair  Speech:Clear and Coherent  Speech Volume:Normal  Handedness:-- (not assessed)   Mood and Affect  Mood:-- ("frustrated")  Affect:Appropriate; Full Range (tearful when discussing his daughter)   Thought Process  Thought Processes:Coherent  Descriptions of Associations:Intact  Orientation:Full (Time, Place and Person)  Thought Content:WDL  History of Schizophrenia/Schizoaffective disorder:No data recorded Duration of Psychotic Symptoms:No data recorded Hallucinations:Hallucinations: None  Ideas of Reference:None  Suicidal Thoughts:Suicidal Thoughts: No  Homicidal Thoughts:Homicidal Thoughts: No   Sensorium  Memory:-- (not assessed)  Judgment:No data recorded Insight:Fair   Executive Functions  Concentration:Good  Attention Span:Good  Recall:Fair  Fund of Knowledge:Fair  Language:Fair   Psychomotor Activity  Psychomotor Activity:Psychomotor Activity: Normal   Assets  Assets:Communication Skills; Desire for Improvement   Sleep  Sleep:Sleep: Fair    Physical Exam: Physical Exam Constitutional:      Appearance: Normal appearance.  HENT:     Head: Normocephalic and atraumatic.  Neurological:     General: No focal deficit present.     Mental Status: He is alert.  Psychiatric:        Mood and Affect: Mood normal.        Behavior: Behavior normal.        Thought Content: Thought content normal.    ROS Blood pressure 102/64, pulse 81, temperature 98.5 F (36.9 C), temperature source Oral, resp. rate 19, height 6' (1.829 m),  weight 122.5 kg, SpO2 95%. Body mass index is 36.62 kg/m.

## 2022-12-14 NOTE — Evaluation (Signed)
Occupational Therapy Evaluation Patient Details Name: Jonathan Ibarra MRN: [id number] DOB: 06-May-1965 Today's Date: 12/14/2022   History of Present Illness Pt is a 57 y/o male presenting 10/13 falling on river bed while walking in the woods. Found with R 8th and 9th rib fx, R proximal tibial fx. 10/14 S/P ORIF R tibial plateau and repair of R lateral mensicus. MVH:QIONGEX abuse, [name], HTN, liver cirrhosis, polysubstance abuse.   Clinical Impression   PTA patient independent.  Admitted for above and presents with problem list below, including R LE pain, impaired balance, decreased activity tolerance.  He requires min guard for bed mobility, min assist +2 safety for transfers to recliner, and setup to max assist for ADLs. Good carryover of techniques with transfers, adherence to R LE NWB precautions during session. Pt internally distracted by pain, but overall cognition appears WFL.  Educated on safety, recommendations.  BP soft but stable, SpO2 on RA >90%.  Based on performance today, will follow acutely with recommendations for HHOT at dc.       If plan is discharge home, [name];A lot of help with bathing/dressing/bathroom;Assistance with cooking/housework;Help with stairs or ramp for entrance;Assist for transportation    Functional Status Assessment  Patient has had a recent decline in their functional status and demonstrates the ability to make significant improvements in function in a reasonable and predictable amount of time.  Equipment Recommendations  BSC/3in1    Recommendations for Other Services       Precautions / Restrictions Precautions Precautions: Fall Restrictions Weight Bearing Restrictions: Yes RLE Weight Bearing: Non weight bearing      Mobility Bed Mobility Overal bed mobility: Needs Assistance Bed Mobility: Supine to Sit     Supine to sit: Contact guard     General bed mobility comments:  increased time for R LE mgmt but no physical assist required    Transfers Overall transfer level: Needs assistance Equipment used: Rolling walker (2 wheels) Transfers: Sit to/from Stand, Bed to chair/wheelchair/BSC Sit to Stand: Min assist, +2 safety/equipment     Step pivot transfers: Min assist, +2 safety/equipment     General transfer comment: cueing for hand placement, technique and safety but able to power up with min assist and step hop towards recliner on L side      Balance Overall balance assessment: Needs assistance Sitting-balance support: No upper extremity supported, Feet supported Sitting balance-Leahy Scale: Fair     Standing balance support: Bilateral upper extremity supported, During functional activity, Reliant on assistive device for balance Standing balance-Leahy Scale: Poor Standing balance comment: relies on RW                           ADL either performed or assessed with clinical judgement   ADL Overall ADL's : Needs assistance/impaired     Grooming: Set up;Sitting           Upper Body Dressing : Set up;Sitting   Lower Body Dressing: Maximal assistance;Sit to/from stand Lower Body Dressing Details (indicate cue type and reason): requires assist for L sock, min assist in standing but relies on BUE support Toilet Transfer: Minimal assistance;Rolling walker (2 wheels);Ambulation;+2 for safety/equipment           Functional mobility during ADLs: Minimal assistance;+2 for safety/equipment;Rolling walker (2 wheels) General ADL Comments: cueing for techniques to maintain NWB to R LE     Vision   Vision Assessment?: No  apparent visual deficits;Wears glasses for reading     Perception         Praxis         Pertinent Vitals/Pain Pain Assessment Pain Assessment: 0-10 Pain Score: 7  Pain Location: RLE Pain Descriptors / Indicators: Discomfort, Grimacing Pain Intervention(s): Limited activity within patient's tolerance,  Monitored during session, Repositioned     Extremity/Trunk Assessment Upper Extremity Assessment Upper Extremity Assessment: Overall WFL for tasks assessed   Lower Extremity Assessment Lower Extremity Assessment: Defer to PT evaluation   Cervical / Trunk Assessment Cervical / Trunk Assessment: Other exceptions Cervical / Trunk Exceptions: R rib fxs   Communication Communication Communication: No apparent difficulties   Cognition Arousal: Alert Behavior During Therapy: WFL for tasks assessed/performed Overall Cognitive Status: Within Functional Limits for tasks assessed                                 General Comments: appears WFL, oriented and following commands.  internally distracted by pain but adhering to NWB to R LE thoughout session     General Comments  BP soft but stable 98/59 EOB and 98/54 after transfer to recliner; SPO2 sustained on RA    Exercises     Shoulder Instructions      Home Living Family/patient expects to be discharged to:: Private residence Living Arrangements: Non-relatives/Friends Available Help at Discharge: Available PRN/intermittently;Friend(s) Type of Home: House Home Access: Stairs to enter Secretary/administrator of Steps: 2 Entrance Stairs-Rails: Right Home Layout: One level     Bathroom Shower/Tub: Chief Strategy Officer: Standard     Home Equipment: Cane - single Librarian, academic (2 wheels)          Prior Functioning/Environment Prior Level of Function : Independent/Modified Independent             Mobility Comments: independent ADLs Comments: independent ADls, light IADLs; doesn't drive or work        OT Problem List: Decreased strength;Decreased activity tolerance;Impaired balance (sitting and/or standing);Pain;Obesity;Decreased knowledge of precautions;Decreased knowledge of use of DME or AE;Decreased safety awareness      OT Treatment/Interventions: Self-care/ADL  training;Therapeutic exercise;DME and/or AE instruction;Therapeutic activities;Balance training;Patient/family education    OT Goals(Current goals can be found in the care plan section) Acute Rehab OT Goals Patient Stated Goal: home OT Goal Formulation: With patient Time For Goal Achievement: 12/28/22 Potential to Achieve Goals: Good  OT Frequency: Min 1X/week    Co-evaluation PT/OT/SLP Co-Evaluation/Treatment: Yes Reason for Co-Treatment: For patient/therapist safety;To address functional/ADL transfers;Other (comment) (pain)   OT goals addressed during session: ADL's and self-care      AM-PAC OT "6 Clicks" Daily Activity     Outcome Measure Help from another person eating meals?: None Help from another person taking care of personal grooming?: A Little Help from another person toileting, which includes using toliet, bedpan, or urinal?: A Lot Help from another person bathing (including washing, rinsing, drying)?: A Lot Help from another person to put on and taking off regular upper body clothing?: A Little Help from another person to put on and taking off regular lower body clothing?: A Lot 6 Click Score: 16   End of Session Equipment Utilized During Treatment: Gait belt;Rolling walker (2 wheels) Nurse Communication: Mobility status;Other (comment) (asking for access to his glasseS?)  Activity Tolerance: Patient tolerated treatment well Patient left: in chair;with call bell/phone within reach;with chair alarm set  OT Visit Diagnosis: Other abnormalities  of gait and mobility (R26.89);Muscle weakness (generalized) (M62.81);Pain Pain - Right/Left: Right Pain - part of body: Leg                Time: 8295-6213 OT Time Calculation (min): 35 min Charges:  OT General Charges $OT Visit: 1 Visit OT Evaluation $OT Eval Moderate Complexity: 1 Mod  Barry Brunner, OT Acute Rehabilitation Services Office [phone]   Jonathan Ibarra 12/14/2022, 1:22 PM

## 2022-12-14 NOTE — Evaluation (Signed)
Physical Therapy Evaluation Patient Details Name: Kisean Rollo MRN: [id number] DOB: 1965-04-04 Today's Date: 12/14/2022  History of Present Illness  Pt is a 57 y/o male presenting 10/13 [place]. Found with R 8th and 9th rib fx, R proximal tibial fx. 10/14 S/P ORIF R tibial plateau and repair of R lateral mensicus. HQI:ONGEXBM abuse, [name], HTN, liver cirrhosis, polysubstance abuse.  Clinical Impression  Pt presents with admitting diagnosis above. Co-treat with OT. Pt today was able to transfer to chair +2 Min A RW with cues for NWB precautions. PTA pt was fully independent with no AD. Recommend HHPT upon DC. PT will continue to follow.       If plan is discharge home, recommend the following: A lot of help with walking and/or transfers;[name];Assistance with cooking/housework;Assist for transportation;Help with stairs or ramp for entrance   Can travel by private vehicle        Equipment Recommendations Rolling walker (2 wheels);BSC/3in1;Wheelchair (measurements PT);Wheelchair cushion (measurements PT)  Recommendations for Other Services       Functional Status Assessment Patient has had a recent decline in their functional status and demonstrates the ability to make significant improvements in function in a reasonable and predictable amount of time.     Precautions / Restrictions Precautions Precautions: Fall Restrictions Weight Bearing Restrictions: Yes RLE Weight Bearing: Non weight bearing      Mobility  Bed Mobility Overal bed mobility: Needs Assistance Bed Mobility: Supine to Sit     Supine to sit: Contact guard     General bed mobility comments: increased time for R LE mgmt but no physical assist required    Transfers Overall transfer level: Needs assistance Equipment used: Rolling walker (2 wheels) Transfers: Sit to/from Stand, Bed to chair/wheelchair/BSC Sit to Stand: Min  assist, +2 safety/equipment   Step pivot transfers: Min assist, +2 safety/equipment       General transfer comment: cueing for hand placement, technique and safety but able to power up with min assist and step hop towards recliner on L side    Ambulation/Gait               General Gait Details: Deferred  Stairs            Wheelchair Mobility     Tilt Bed    Modified Rankin (Stroke Patients Only)       Balance Overall balance assessment: Needs assistance Sitting-balance support: No upper extremity supported, Feet supported Sitting balance-Leahy Scale: Fair     Standing balance support: Bilateral upper extremity supported, During functional activity, Reliant on assistive device for balance Standing balance-Leahy Scale: Poor Standing balance comment: relies on RW                             Pertinent Vitals/Pain Pain Assessment Pain Assessment: 0-10 Pain Score: 7  Pain Location: RLE Pain Descriptors / Indicators: Discomfort, Grimacing Pain Intervention(s): Limited activity within patient's tolerance, Monitored during session, Repositioned    Home Living Family/patient expects to be discharged to:: Private residence Living Arrangements: Non-relatives/Friends Available Help at Discharge: Available PRN/intermittently;Friend(s) Type of Home: House Home Access: Stairs to enter Entrance Stairs-Rails: Right Entrance Stairs-Number of Steps: 2   Home Layout: One level Home Equipment: Cane - single Librarian, academic (2 wheels)      Prior Function Prior Level of Function : Independent/Modified Independent  Mobility Comments: independent ADLs Comments: independent ADls, light IADLs; doesn't drive or work     Extremity/Trunk Assessment   Upper Extremity Assessment Upper Extremity Assessment: Overall WFL for tasks assessed    Lower Extremity Assessment Lower Extremity Assessment: RLE deficits/detail RLE Deficits /  Details: Tibial plateau fx    Cervical / Trunk Assessment Cervical / Trunk Assessment: Other exceptions Cervical / Trunk Exceptions: R rib fxs  Communication   Communication Communication: No apparent difficulties  Cognition Arousal: Alert Behavior During Therapy: WFL for tasks assessed/performed Overall Cognitive Status: Within Functional Limits for tasks assessed                                 General Comments: appears WFL, oriented and following commands.  internally distracted by pain but adhering to NWB to R LE thoughout session        General Comments General comments (skin integrity, edema, etc.): BP soft but stable 98/59 EOB and 98/54 after transfer to recliner; SPO2 sustained on RA    Exercises     Assessment/Plan    PT Assessment Patient needs continued PT services  PT Problem List Decreased strength;Decreased range of motion;Decreased balance;Decreased activity tolerance;Decreased mobility;Decreased coordination;Decreased knowledge of use of DME;Decreased safety awareness;Decreased knowledge of precautions;Pain;Obesity;Cardiopulmonary status limiting activity       PT Treatment Interventions DME instruction;Gait training;Stair training;Functional mobility training;Therapeutic activities;Therapeutic exercise;Balance training;Neuromuscular re-education;Patient/family education;Wheelchair mobility training    PT Goals (Current goals can be found in the Care Plan section)  Acute Rehab PT Goals Patient Stated Goal: to go home PT Goal Formulation: With patient Time For Goal Achievement: 12/28/22 Potential to Achieve Goals: Good    Frequency Min 1X/week     Co-evaluation PT/OT/SLP Co-Evaluation/Treatment: Yes Reason for Co-Treatment: For patient/therapist safety;To address functional/ADL transfers;Other (comment) (pain) PT goals addressed during session: Mobility/safety with mobility;Proper use of DME OT goals addressed during session: ADL's and  self-care       AM-PAC PT "6 Clicks" Mobility  Outcome Measure Help needed turning from your back to your side while in a flat bed without using bedrails?: A Little Help needed moving from lying on your back to sitting on the side of a flat bed without using bedrails?: A Little Help needed moving to and from a bed to a chair (including a wheelchair)?: A Lot Help needed standing up from a chair using your arms (e.g., wheelchair or bedside chair)?: A Lot Help needed to walk in hospital room?: A Lot Help needed climbing 3-5 steps with a railing? : Total 6 Click Score: 13    End of Session Equipment Utilized During Treatment: Gait belt Activity Tolerance: Patient tolerated treatment well Patient left: in chair;with call bell/phone within reach;with chair alarm set Nurse Communication: Mobility status PT Visit Diagnosis: Other abnormalities of gait and mobility (R26.89)    Time: 8295-6213 PT Time Calculation (min) (ACUTE ONLY): 35 min   Charges:   PT Evaluation $PT Eval Moderate Complexity: 1 Mod   PT General Charges $$ ACUTE PT VISIT: 1 Visit         Shela Nevin, PT, DPT Acute Rehab Services [phone]   Gladys Damme 12/14/2022, 4:30 PM

## 2022-12-14 NOTE — Progress Notes (Signed)
[name], male   DOB: 1965-12-03, 57 y.o.   MRN: [id number]   LOS: 1 day   Subjective: Still having significant pain but improved over preoperatively.   Objective: Vital signs in last 24 hours: Temp:  [97.6 F (36.4 C)-98.5 F (36.9 C)] 98.5 F (36.9 C) (10/14 1957) Pulse Rate:  [72-86] 81 (10/15 0410) Resp:  [13-20] 19 (10/14 1957) BP: (102-158)/(64-99) 102/64 (10/15 0410) SpO2:  [94 %-100 %] 95 % (10/15 0410) Weight:  [122.5 kg] 122.5 kg (10/14 1203) Last BM Date : 12/12/22   Laboratory  CBC Recent Labs    12/13/22 0807 12/14/22 0622  WBC 7.7 10.0  HGB 13.7 11.9*  HCT 41.2 34.6*  PLT 184 182   BMET Recent Labs    12/13/22 0807 12/14/22 0622  NA 135 132*  K 4.2 4.6  CL 103 102  CO2 24 25  GLUCOSE 110* 134*  BUN 11 17  CREATININE 0.80 0.93  CALCIUM 8.6* 8.3*     Physical Exam General appearance: alert and no distress RLE: KI in place, dressing C/D/I, EHL 5/5, sensation intact   Assessment/Plan: S/p ORIF right tibia plateau -- Plan PT/OT with NWB RLE. Will d/c KI.    Freeman Caldron, PA-C Orthopedic Surgery [phone] 12/14/2022
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
## 2022-12-15 DIAGNOSIS — I10 Essential (primary) hypertension: Secondary | ICD-10-CM | POA: Diagnosis not present

## 2022-12-15 DIAGNOSIS — W131XXA Fall from, out of or through bridge, initial encounter: Secondary | ICD-10-CM | POA: Diagnosis not present

## 2022-12-15 LAB — HCV RNA QUANT: HCV Quantitative: NOT DETECTED [IU]/mL (ref >50)

## 2022-12-15 MED ORDER — ALBUTEROL SULFATE (2.5 MG/3ML) 0.083% IN NEBU: 2.5000 mg | INHALATION_SOLUTION | RESPIRATORY_TRACT | Status: DC | PRN
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
## 2022-12-15 NOTE — Progress Notes (Signed)
PROGRESS NOTE  Jonathan Ibarra  FIE:[id number] DOB: 1965-03-08 DOA: 12/12/2022 PCP: Candi Leash, MD   Brief Narrative: Patient is a 57 year old male with history of decompensated cirrhosis status post TIPS, A-fib not on anticoagulation, hypertension, polysubstance abuse using meth, HCV, seizure disorder who was brought as a level 2 trauma after falling  to [place] the [place].  He complained of right leg pain, right sided chest pain, back pain.  Denied depression, suicidality.  Imaging showed right-sided eighth, ninth rib fracture, right proximal comminuted tibial fracture with intra-articular extension.  Orthopedics consulted. S/P  ORIF on 10/14.  PT/OT recommended home health.  Hospital course remarkable for persistent pain, bleeding from the incision site.  Might need more PT/OT session before discharge home.  Assessment & Plan:  Principal Problem:   Fall from bridge, initial encounter   Polytrauma/right tibia plateau fracture: Report of falling [place] .  Found to have proximal right comminuted tibial fracture, right-sided rib fractures.  Evaluated by trauma surgery, orthopedics.  Intoxicated on arrival.  But now alert and oriented.  UDS showed meth, THC.  Status post ORIF of right lower extremity fracture.     PT/OT consulted, recommend home health.  Continue pain management, supportive care.  Found to have bleeding from the incision site.  Orthopedics following. He complained of severe pain today.  Might need to continue PT/OT for a few more sessions before discharge home.  Rib fractures: Monitor on room air.Continue incentive spirometer, flutter valve.  Denies any chest pain  Polysubstance abuse: UDS positive for meth, THC.  Hepatitis C antibody positive. on CIWA protocol, thiamine, folic acid.  Counseled for cessation  Incidental finding of left adrenal nodule: 1.9 cm.  Recommend monitoring as an outpatient.  Decompensated  liver cirrhosis: Status post TIPS.  Ammonia level slightly elevated.  Continue lactulose.  Home Lasix, spironolactone on hold. Normal  liver enzymes.  Has history of hepatitis C  Paroxysmal A-fib: Currently in normal sinus rhythm.  Not on anticoagulation likely due to noncompliance.  On metoprolol for rate control, will be continued.    Hypertension: On spironolactone, metoprolol, Lasix at home.  Currently spironolactone, Lasix on hold.  Blood pressure stable  Seizure disorder:Not on AEDs  Suspected suicidal intention: He was admitted as a fall from bridge.  He declines that.  He never fell from bridge.  He was walking in the woods and fell on river bed.  He  denies any suicidal ideations or depressed mood.  Psychiatry cleared him.  Discontinued sitter.  Recommended to follow-up with psychiatry as an outpatient.  Obesity: BMI 36.6         DVT prophylaxis:enoxaparin (LOVENOX) injection 40 mg Start: 12/14/22 0900 SCDs Start: 12/13/22 0330     Code Status: Full Code  Family Communication: None at the bedside  Patient status: Inpatient   Patient is from : Home  Anticipated discharge to: Home   Estimated DC date: 1-2 days   Consultants: Orthopedics, psychiatry  Procedures: ORIF  Antimicrobials:  Anti-infectives (From admission, onward)    Start     Dose/Rate Route Frequency Ordered Stop   12/13/22 2000  ceFAZolin (ANCEF) IVPB 2g/100 mL premix        2 g 200 mL/hr over 30 Minutes Intravenous Every 8 hours 12/13/22 1532 12/14/22 1258   12/13/22 1336  vancomycin (VANCOCIN) powder  Status:  Discontinued          As needed 12/13/22 1336 12/13/22 1405  12/13/22 1115  ceFAZolin (ANCEF) IVPB 2g/100 mL premix        2 g 200 mL/hr over 30 Minutes Intravenous On call to O.R. 12/13/22 1100 12/13/22 1240   12/13/22 1104  ceFAZolin (ANCEF) 2-4 GM/100ML-% IVPB       Note to Pharmacy: Lurena Nida: cabinet override      12/13/22 1104 12/13/22 1248       Subjective: Patient seen  and examined the bedside today.  He complained of severe pain on the operated side.  Hemodynamically stable.  On room air  Objective: Vitals:   12/14/22 1945 12/15/22 0428 12/15/22 0500 12/15/22 0831  BP: (!) 106/58 127/79  134/76  Pulse: 80 87  98  Resp: 18 18  18   Temp: 99.1 F (37.3 C) 98.7 F (37.1 C)  98.9 F (37.2 C)  TempSrc: Oral Oral  Oral  SpO2: 95% 94%  92%  Weight:   129.4 kg   Height:        Intake/Output Summary (Last 24 hours) at 12/15/2022 1117 Last data filed at 12/14/2022 2300 Gross per 24 hour  Intake 240 ml  Output 600 ml  Net -360 ml   Filed Weights   12/13/22 1203 12/15/22 0500  Weight: 122.5 kg 129.4 kg    Examination:  General exam: Overall comfortable, not in distress,obese HEENT: PERRL Respiratory system:  no wheezes or crackles  Cardiovascular system: S1 & S2 heard, RRR.  Gastrointestinal system: Abdomen is nondistended, soft and nontender. Central nervous system: Alert and oriented Extremities: No edema, no clubbing ,no cyanosis, right lower extremity wrapped with dressing Skin: No rashes, no ulcers,no icterus, tattoos   Data Reviewed: I have personally reviewed following labs and imaging studies  CBC: Recent Labs  Lab 12/12/22 2310 12/12/22 2311 12/13/22 0807 12/14/22 0622  WBC 7.6  --  7.7 10.0  HGB 13.3 12.6* 13.7 11.9*  HCT 39.4 37.0* 41.2 34.6*  MCV 93.1  --  92.0 92.8  PLT 184  --  184 182   Basic Metabolic Panel: Recent Labs  Lab 12/12/22 2310 12/12/22 2311 12/13/22 0807 12/14/22 0622  NA 137 138 135 132*  K 4.0 4.0 4.2 4.6  CL 102 104 103 102  CO2 24  --  24 25  GLUCOSE 102* 101* 110* 134*  BUN 16 17 11 17   CREATININE 1.08 1.00 0.80 0.93  CALCIUM 9.3  --  8.6* 8.3*  MG  --   --  1.8  --   PHOS  --   --  2.9  --      Recent Results (from the past 240 hour(s))  Surgical pcr screen     Status: Abnormal   Collection Time: 12/13/22  7:51 AM   Specimen: Nasal Mucosa; Nasal Swab  Result Value Ref Range  Status   MRSA, PCR POSITIVE (A) NEGATIVE Final   Staphylococcus aureus POSITIVE (A) NEGATIVE Final    Comment: RESULT CALLED TO, READ BACK BY AND VERIFIED WITH: RN SIERA.C AT 1406 ON 12/13/2022 BY T.SAAD. (NOTE) The Xpert SA Assay (FDA approved for NASAL specimens in patients 28 years of age and older), is one component of a comprehensive surveillance program. It is not intended to diagnose infection nor to guide or monitor treatment. Performed at [facility], [address], [place], [place] [address]      Radiology Studies: DG Knee Right Port  Result Date: 12/13/2022 CLINICAL DATA:  Postop. EXAM: PORTABLE RIGHT KNEE - 1-2 VIEW COMPARISON:  Preoperative imaging. FINDINGS:  Lateral plate and screw fixation of comminuted tibial plateau fracture. Improved fracture alignment from preoperative imaging. Recent postsurgical change includes air and edema in the soft tissues and joint space. IMPRESSION: ORIF of comminuted tibial plateau fracture. No immediate postoperative complication. Electronically Signed   By: Narda Rutherford M.D.   On: 12/13/2022 15:26   DG Knee Complete 4 Views Right  Result Date: 12/13/2022 CLINICAL DATA:  Elective surgery. EXAM: RIGHT KNEE - COMPLETE 4+ VIEW COMPARISON:  Preoperative imaging FINDINGS: Six fluoroscopic spot views of the right knee obtained in the operating room. Lateral plate and screw fixation of tibial plateau fracture. Fluoroscopy time 1 minutes 45 seconds. Dose 7.38 mGy. IMPRESSION: Intraoperative fluoroscopy during tibial plateau fracture fixation. Electronically Signed   By: Narda Rutherford M.D.   On: 12/13/2022 15:25   DG C-Arm 1-60 Min-No Report  Result Date: 12/13/2022 Fluoroscopy was utilized by the requesting physician.  No radiographic interpretation.   DG C-Arm 1-60 Min-No Report  Result Date: 12/13/2022 Fluoroscopy was utilized by the requesting physician.  No radiographic interpretation.    Scheduled Meds:  enoxaparin  (LOVENOX) injection  40 mg Subcutaneous Q24H   folic acid  1 mg Oral Daily   ketorolac  30 mg Intravenous Q6H   lactulose  20 g Oral TID   methocarbamol  750 mg Oral Q8H   metoprolol tartrate  25 mg Oral BID   multivitamin with minerals  1 tablet Oral Daily   mupirocin ointment   Nasal BID   sodium chloride flush  3 mL Intravenous Q12H   thiamine  100 mg Oral Daily   Or   thiamine  100 mg Intravenous Daily   Continuous Infusions:     LOS: 2 days   Burnadette Pop, MD [facility] P10/16/2024, 11:17 AM

## 2022-12-15 NOTE — Progress Notes (Signed)
Physical Therapy Treatment Patient Details Name: Jonathan Ibarra MRN: [id number] DOB: 02/11/1966 Today's Date: 12/15/2022   History of Present Illness Pt is a 57 y/o male presenting 10/13 falling on river bed while [place] the woods. Found with R 8th and 9th rib fx, R proximal tibial fx. 10/14 S/P ORIF R tibial plateau and repair of R lateral mensicus. WUJ:WJXBJYN abuse, [name], HTN, liver cirrhosis, polysubstance abuse.    PT Comments  Pt with fair tolerance to treatment today. Pt able to transfer to chair with RW CGA however very limited by pain and tearful today. No change in DC/DME recs at this time. PT will continue to follow.    If plan is discharge home, recommend the following: A lot of help with walking and/or transfers;[name];Assistance with cooking/housework;Assist for transportation;Help with stairs or ramp for entrance   Can travel by private vehicle        Equipment Recommendations  Rolling walker (2 wheels);BSC/3in1;Wheelchair (measurements PT);Wheelchair cushion (measurements PT)    Recommendations for Other Services       Precautions / Restrictions Precautions Precautions: Fall Restrictions Weight Bearing Restrictions: Yes RLE Weight Bearing: Non weight bearing     Mobility  Bed Mobility Overal bed mobility: Needs Assistance Bed Mobility: Supine to Sit     Supine to sit: Contact guard     General bed mobility comments: increased time for R LE mgmt but no physical assist required    Transfers Overall transfer level: Needs assistance Equipment used: Rolling walker (2 wheels) Transfers: Sit to/from Stand, Bed to chair/wheelchair/BSC Sit to Stand: Contact guard assist   Step pivot transfers: Contact guard assist       General transfer comment: cueing for hand placement, technique and safety but able to power up with CGA fro safety and step hop towards recliner on L side    Ambulation/Gait                General Gait Details: Deferred   Stairs             Wheelchair Mobility     Tilt Bed    Modified Rankin (Stroke Patients Only)       Balance Overall balance assessment: Needs assistance Sitting-balance support: No upper extremity supported, Feet supported Sitting balance-Leahy Scale: Fair     Standing balance support: Bilateral upper extremity supported, During functional activity, Reliant on assistive device for balance Standing balance-Leahy Scale: Poor Standing balance comment: relies on RW                            Cognition Arousal: Alert Behavior During Therapy: WFL for tasks assessed/performed Overall Cognitive Status: Within Functional Limits for tasks assessed                                 General Comments: appears WFL, oriented and following commands.  internally distracted by pain but adhering to NWB to R LE thoughout session        Exercises      General Comments        Pertinent Vitals/Pain Pain Assessment Pain Assessment: Faces Faces Pain Scale: Hurts worst Pain Location: RLE Pain Descriptors / Indicators: Discomfort, Grimacing, Crying, Moaning Pain Intervention(s): Monitored during session, Patient requesting pain meds-RN notified, RN gave pain meds during session, Repositioned, Limited activity within patient's tolerance    Home Living  Prior Function            PT Goals (current goals can now be found in the care plan section) Acute Rehab PT Goals Patient Stated Goal: to go home Progress towards PT goals: Progressing toward goals    Frequency    Min 1X/week      PT Plan      Co-evaluation              AM-PAC PT "6 Clicks" Mobility   Outcome Measure  Help needed turning from your back to your side while in a flat bed without using bedrails?: A Little Help needed moving from lying on your back to sitting on the side of a flat bed without using  bedrails?: A Little Help needed moving to and from a bed to a chair (including a wheelchair)?: A Lot Help needed standing up from a chair using your arms (e.g., wheelchair or bedside chair)?: A Lot Help needed to walk in hospital room?: A Lot Help needed climbing 3-5 steps with a railing? : Total 6 Click Score: 13    End of Session Equipment Utilized During Treatment: Gait belt Activity Tolerance: Patient tolerated treatment well Patient left: in chair;with call bell/phone within reach;with chair alarm set Nurse Communication: Mobility status PT Visit Diagnosis: Other abnormalities of gait and mobility (R26.89)     Time: 1431-1445 PT Time Calculation (min) (ACUTE ONLY): 14 min  Charges:    $Therapeutic Activity: 8-22 mins PT General Charges $$ ACUTE PT VISIT: 1 Visit                     Shela Nevin, PT, DPT Acute Rehab Services [phone]    Gladys Damme 12/15/2022, 3:57 PM

## 2022-12-15 NOTE — Progress Notes (Signed)
CSW attempted to meet with pt regarding outpt mental health resources.  Pt sleepy, asked CSW to come back later. Daleen Squibb, MSW, LCSW 10/16/202412:59 PM

## 2022-12-15 NOTE — Plan of Care (Signed)

## 2022-12-16 ENCOUNTER — Encounter (HOSPITAL_COMMUNITY): Payer: Self-pay | Admitting: Student

## 2022-12-16 DIAGNOSIS — W131XXA Fall from, out of or through bridge, initial encounter: Secondary | ICD-10-CM | POA: Diagnosis not present

## 2022-12-16 DIAGNOSIS — I10 Essential (primary) hypertension: Secondary | ICD-10-CM | POA: Diagnosis not present

## 2022-12-16 MED ORDER — SPIRONOLACTONE 25 MG PO TABS
25.0000 mg | ORAL_TABLET | Freq: Every day | ORAL | Status: DC
  Administered 2022-12-16 – 2022-12-17 (×2): 25 mg via ORAL
  Filled 2022-12-16 (×2): qty 1

## 2022-12-16 MED ORDER — FUROSEMIDE 40 MG PO TABS
40.0000 mg | ORAL_TABLET | Freq: Every day | ORAL | Status: DC
  Administered 2022-12-16 – 2022-12-17 (×2): 40 mg via ORAL
  Filled 2022-12-16 (×2): qty 1

## 2022-12-16 NOTE — Progress Notes (Signed)
CSW spoke with pt regarding outpt mental health follow up.  Pt confirms he has both [place] and medicaid, discussed calling [employer] to get information on in network providers and pt verbalized understand.  List of outpt providers who can take medicaid also provided. Daleen Squibb, MSW, LCSW 10/17/20249:57 AM

## 2022-12-16 NOTE — TOC Progression Note (Signed)
Transition of Care [facility]) - Progression Note    Patient Details  Name: Jonathan Ibarra MRN: [id number] Date of Birth: 1965/07/07  Transition of Care [facility]) CM/SW Contact  [name], RN Phone Number: 12/16/2022, 2:02 PM  Clinical Narrative:    Case Manager spoke with patient with assistance concerning discharge plan. He says he has a significant other, confirmed his address: [address], [place], [place] . Patient has no preference for Home Health agencies, CM called referral to [facility]ia[facility]. DME will be delivered to patient's room by [employer].    Expected Discharge Plan: Home w Home Health Services Barriers to Discharge: No Barriers Identified  Expected Discharge Plan and Services   Discharge Planning Services: CM Consult Post Acute Care Choice: Durable Medical Equipment, Home Health Living arrangements for the past 2 months: Apartment                 DME Arranged: Walker rolling, 3-N-1, Wheelchair manual DME Agency: [employer] Date DME Agency Contacted: 12/16/22 Time DME Agency Contacted: 1222 Representative spoke with at DME Agency: Shaune Leeks HH Arranged: PT HH Agency: [facility] Date [facility] Agency Contacted: 12/16/22 Time HH Agency Contacted: 1250 Representative spoke with at [facility] Agency: [facility]   Social Determinants of Health (SDOH) Interventions SDOH Screenings   Food Insecurity: No Food Insecurity (10/01/2022)  Housing: Low Risk  (10/01/2022)  Transportation Needs: No Transportation Needs (10/01/2022)  Utilities: Not At Risk (10/01/2022)  Financial Resource Strain: Medium Risk (01/07/2020)   Received from [facility], [facility]  Social Connections: Unknown (07/05/2021)   Received from [facility], [facility]  Stress: No Stress Concern Present (01/07/2020)   Received from [facility], [facility]  Tobacco Use: High Risk (12/13/2022)    Readmission Risk Interventions     No data to display

## 2022-12-16 NOTE — Progress Notes (Signed)
Ortho Trauma Note  Dressing appears stable with minimal strikethrough. Will follow up and change tomorrow then likely okay for DC from ortho standpoint.  Roby Lofts, MD [facility] [phone] (office) [email or web address]

## 2022-12-16 NOTE — Plan of Care (Signed)

## 2022-12-16 NOTE — Progress Notes (Signed)
PT Cancellation Note  Patient Details Name: Trayson Stitely MRN: [id number] DOB: 1965-12-21   Cancelled Treatment:    Reason Eval/Treat Not Completed: Pain limiting ability to participate (Pt refused due to increased pain compared to yesterday. Pt stated that he is going home tomorrow however when educated that he needs to move more prior to DC pt still refused. Will follow up tomorrow.)   Gladys Damme 12/16/2022, 3:33 PM

## 2022-12-16 NOTE — Progress Notes (Signed)
PROGRESS NOTE  Jonathan Ibarra  WUJ:[id number] DOB: [birth year] DOA: 12/12/2022 PCP: Candi Leash, MD   Brief Narrative: Patient is a 57 year old male with history of decompensated cirrhosis status post TIPS, A-fib not on anticoagulation, hypertension, polysubstance abuse using meth, HCV, seizure disorder who was brought as a level 2 trauma after [place]  to [place].  He complained of right leg pain, right sided chest pain, back pain.  Denied depression, suicidality.  Imaging showed right-sided eighth, ninth rib fracture, right proximal comminuted tibial fracture with intra-articular extension.  Orthopedics consulted. S/P  ORIF on 10/14.  PT/OT recommended home health.  Hospital course remarkable for persistent pain, bleeding from the incision site.  Might need more PT/OT session before discharge home.  Discharge plan tomorrow.  Assessment & Plan:  Principal Problem:   Fall from bridge, initial encounter   Polytrauma/right tibia plateau fracture: [place] of [place] .  Found to have proximal right comminuted tibial fracture, right-sided rib fractures.  Evaluated by trauma surgery, orthopedics.  Intoxicated on arrival.  But now alert and oriented.  UDS showed meth, THC.  Status post ORIF of right lower extremity fracture.   PT/OT consulted, recommend home health.  Continue pain management, supportive care.  Found to have bleeding from the incision site.  Orthopedics following. Patient looks more comfortable today.  Pain better than yesterday.  He was requesting for 1 more day  Rib fractures: Monitor on room air.Continue incentive spirometer, flutter valve.  Denies any chest pain  Polysubstance abuse: UDS positive for meth, THC.  Hepatitis C antibody positive. on CIWA protocol, thiamine, folic acid.  Counseled for cessation  Incidental finding of left adrenal nodule: 1.9 cm.  Recommend monitoring as an  outpatient.  Decompensated liver cirrhosis: Status post TIPS.  Ammonia level slightly elevated.  Continue lactulose.  Home Lasix, spironolactone resumed.Normal  liver enzymes.  Has history of hepatitis C  Paroxysmal A-fib: Currently in normal sinus rhythm.  Not on anticoagulation likely due to noncompliance.  On metoprolol for rate control, will be continued.    Hypertension: On spironolactone, metoprolol, Lasix at home.   Blood pressure stable  Seizure disorder:Not on AEDs  Suspected suicidal intention: He was admitted as a fall from bridge.  He declines that.  He never fell from bridge.  He was walking in the woods and fell on river bed.  He  denies any suicidal ideations or depressed mood.  Psychiatry cleared him.  Discontinued sitter.  Recommended to follow-up with psychiatry as an outpatient.  Obesity: BMI 36.6         DVT prophylaxis:enoxaparin (LOVENOX) injection 40 mg Start: 12/14/22 0900 SCDs Start: 12/13/22 0330     Code Status: Full Code  Family Communication: None at the bedside  Patient status: Inpatient   Patient is from : Home  Anticipated discharge to: Home   Estimated DC date: tomorrow   Consultants: Orthopedics, psychiatry  Procedures: ORIF  Antimicrobials:  Anti-infectives (From admission, onward)    Start     Dose/Rate Route Frequency Ordered Stop   12/13/22 2000  ceFAZolin (ANCEF) IVPB 2g/100 mL premix        2 g 200 mL/hr over 30 Minutes Intravenous Every 8 hours 12/13/22 1532 12/14/22 1258   12/13/22 1336  vancomycin (VANCOCIN) powder  Status:  Discontinued          As needed 12/13/22 1336 12/13/22 1405   12/13/22 1115  ceFAZolin (ANCEF) IVPB  2g/100 mL premix        2 g 200 mL/hr over 30 Minutes Intravenous On call to O.R. 12/13/22 1100 12/13/22 1240   12/13/22 1104  ceFAZolin (ANCEF) 2-4 GM/100ML-% IVPB       Note to Pharmacy: Lurena Nida: cabinet override      12/13/22 1104 12/13/22 1248       Subjective: Patient seen and examined  the bedside today.  Hemodynamically stable.  His pain is better today.  He was requesting for 1 more day to be in the hospital.  Plan for discharge home with home health tomorrow.  Objective: Vitals:   12/15/22 1950 12/16/22 0445 12/16/22 0452 12/16/22 0907  BP: 136/72 130/73  122/67  Pulse: 97 78  82  Resp: 18 18  18   Temp: 98.4 F (36.9 C) 98.4 F (36.9 C)  98 F (36.7 C)  TempSrc: Oral Oral  Oral  SpO2: 97% 98%  95%  Weight:   126.8 kg   Height:        Intake/Output Summary (Last 24 hours) at 12/16/2022 1136 Last data filed at 12/16/2022 0955 Gross per 24 hour  Intake 239 ml  Output 1350 ml  Net -1111 ml   Filed Weights   12/13/22 1203 12/15/22 0500 12/16/22 0452  Weight: 122.5 kg 129.4 kg 126.8 kg    Examination:   General exam: Overall comfortable, not in distress,obese HEENT: PERRL Respiratory system:  no wheezes or crackles  Cardiovascular system: S1 & S2 heard, RRR.  Gastrointestinal system: Abdomen is nondistended, soft and nontender. Central nervous system: Alert and oriented Extremities: No edema, no clubbing ,no cyanosis, dressing on the right knee Skin: No rashes, no ulcers,no icterus     Data Reviewed: I have personally reviewed following labs and imaging studies  CBC: Recent Labs  Lab 12/12/22 2310 12/12/22 2311 12/13/22 0807 12/14/22 0622  WBC 7.6  --  7.7 10.0  HGB 13.3 12.6* 13.7 11.9*  HCT 39.4 37.0* 41.2 34.6*  MCV 93.1  --  92.0 92.8  PLT 184  --  184 182   Basic Metabolic Panel: Recent Labs  Lab 12/12/22 2310 12/12/22 2311 12/13/22 0807 12/14/22 0622  NA 137 138 135 132*  K 4.0 4.0 4.2 4.6  CL 102 104 103 102  CO2 24  --  24 25  GLUCOSE 102* 101* 110* 134*  BUN 16 17 11 17   CREATININE 1.08 1.00 0.80 0.93  CALCIUM 9.3  --  8.6* 8.3*  MG  --   --  1.8  --   PHOS  --   --  2.9  --      Recent Results (from the past 240 hour(s))  Surgical pcr screen     Status: Abnormal   Collection Time: 12/13/22  7:51 AM   Specimen:  Nasal Mucosa; Nasal Swab  Result Value Ref Range Status   MRSA, PCR POSITIVE (A) NEGATIVE Final   Staphylococcus aureus POSITIVE (A) NEGATIVE Final    Comment: RESULT CALLED TO, READ BACK BY AND VERIFIED WITH: RN SIERA.C AT 1406 ON 12/13/2022 BY T.SAAD. (NOTE) The Xpert SA Assay (FDA approved for NASAL specimens in patients 11 years of age and older), is one component of a comprehensive surveillance program. It is not intended to diagnose infection nor to guide or monitor treatment. Performed at [facility], [address] [place], [place] [address]      Radiology Studies: No results found.  Scheduled Meds:  enoxaparin (LOVENOX) injection  40  mg Subcutaneous Q24H   folic acid  1 mg Oral Daily   ketorolac  30 mg Intravenous Q6H   lactulose  20 g Oral TID   methocarbamol  750 mg Oral Q8H   metoprolol tartrate  25 mg Oral BID   multivitamin with minerals  1 tablet Oral Daily   mupirocin ointment   Nasal BID   sodium chloride flush  3 mL Intravenous Q12H   thiamine  100 mg Oral Daily   Or   thiamine  100 mg Intravenous Daily   Continuous Infusions:     LOS: 3 days   Burnadette Pop, MD [facility] P10/17/2024, 11:36 AM

## 2022-12-17 ENCOUNTER — Other Ambulatory Visit (HOSPITAL_COMMUNITY): Payer: Self-pay

## 2022-12-17 DIAGNOSIS — I10 Essential (primary) hypertension: Secondary | ICD-10-CM | POA: Diagnosis not present

## 2022-12-17 DIAGNOSIS — W131XXA Fall from, out of or through bridge, initial encounter: Secondary | ICD-10-CM | POA: Diagnosis not present

## 2022-12-17 MED ORDER — POLYETHYLENE GLYCOL 3350 17 GM/SCOOP PO POWD
17.0000 g | Freq: Every day | ORAL | 0 refills | Status: DC
  Filled 2022-12-17: qty 238, 14d supply, fill #0

## 2022-12-17 MED ORDER — FUROSEMIDE 40 MG PO TABS
40.0000 mg | ORAL_TABLET | Freq: Every day | ORAL | 0 refills | Status: DC
  Filled 2022-12-17: qty 60, 60d supply, fill #0

## 2022-12-17 MED ORDER — METOPROLOL TARTRATE 25 MG PO TABS
25.0000 mg | ORAL_TABLET | Freq: Two times a day (BID) | ORAL | 0 refills | Status: DC
  Filled 2022-12-17: qty 120, 60d supply, fill #0

## 2022-12-17 MED ORDER — OXYCODONE HCL 5 MG PO TABS
5.0000 mg | ORAL_TABLET | ORAL | 0 refills | Status: DC | PRN
  Filled 2022-12-17: qty 20, 4d supply, fill #0

## 2022-12-17 MED ORDER — SPIRONOLACTONE 25 MG PO TABS
25.0000 mg | ORAL_TABLET | Freq: Every day | ORAL | 0 refills | Status: DC
  Filled 2022-12-17: qty 60, 60d supply, fill #0

## 2022-12-17 MED ORDER — METHOCARBAMOL 750 MG PO TABS
750.0000 mg | ORAL_TABLET | Freq: Three times a day (TID) | ORAL | 0 refills | Status: DC | PRN
  Filled 2022-12-17: qty 30, 10d supply, fill #0

## 2022-12-17 MED ORDER — LACTULOSE 10 GM/15ML PO SOLN
20.0000 g | Freq: Two times a day (BID) | ORAL | 0 refills | Status: DC
  Filled 2022-12-17: qty 1892, 32d supply, fill #0

## 2022-12-17 MED ORDER — THIAMINE HCL 100 MG PO TABS
100.0000 mg | ORAL_TABLET | Freq: Every day | ORAL | 0 refills | Status: DC
  Filled 2022-12-17: qty 60, 60d supply, fill #0

## 2022-12-17 MED ORDER — FOLIC ACID 1 MG PO TABS
1.0000 mg | ORAL_TABLET | Freq: Every day | ORAL | 0 refills | Status: DC
  Filled 2022-12-17: qty 60, 60d supply, fill #0

## 2022-12-17 NOTE — Progress Notes (Signed)
Physical Therapy Treatment Patient Details Name: Jonathan Ibarra MRN: [id number] DOB: Jan 19, 1966 Today's Date: 12/17/2022   History of Present Illness Pt is a 57 y/o male presenting 10/13 falling on [place] bed [place] the woods. Found with R 8th and 9th rib fx, R proximal tibial fx. 10/14 S/P ORIF R tibial plateau and repair of R lateral mensicus. VQQ:VZDGLOV abuse, [name], HTN, liver cirrhosis, polysubstance abuse.    PT Comments  Pt with fair tolerance to treatment today. Pt was able to progress gait in room with RW CGA and a chair follow however once fatigued pt noted to WB through RLE requiring max verbal cues for precautions. No change in DC/DME recs at this time. MD and care team made aware that pt has yet to attempt stairs and will need to practice them prior to DC. Will attempt to teach stairs later this afternoon prior to DC otherwise pt will need to be bumped up via WC at home which pt states he is unsure if friend can do.   If plan is discharge home, recommend the following: A lot of help with walking and/or transfers;[name];Assistance with cooking/housework;Assist for transportation;Help with stairs or ramp for entrance   Can travel by private vehicle        Equipment Recommendations  Rolling walker (2 wheels);BSC/3in1;Wheelchair (measurements PT);Wheelchair cushion (measurements PT)    Recommendations for Other Services       Precautions / Restrictions Precautions Precautions: Fall Restrictions Weight Bearing Restrictions: Yes RLE Weight Bearing: Non weight bearing     Mobility  Bed Mobility Overal bed mobility: Needs Assistance Bed Mobility: Supine to Sit     Supine to sit: Supervision     General bed mobility comments: for safety    Transfers Overall transfer level: Needs assistance Equipment used: Rolling walker (2 wheels) Transfers: Sit to/from Stand Sit to Stand: Contact guard assist            General transfer comment: cueing for hand placement and safety    Ambulation/Gait Ambulation/Gait assistance: Contact guard assist, +2 safety/equipment (Chair follow) Gait Distance (Feet): 20 Feet Assistive device: Rolling walker (2 wheels) Gait Pattern/deviations:  (hop to) Gait velocity: decreased     General Gait Details: Hop to gait pattern however once fatigued began to WB through RLE requiring max cues for safety. Chair follow provided   Stairs             Wheelchair Mobility     Tilt Bed    Modified Rankin (Stroke Patients Only)       Balance Overall balance assessment: Needs assistance Sitting-balance support: No upper extremity supported, Feet supported Sitting balance-Leahy Scale: Fair     Standing balance support: Bilateral upper extremity supported, During functional activity Standing balance-Leahy Scale: Poor Standing balance comment: relies on RW                            Cognition Arousal: Alert Behavior During Therapy: WFL for tasks assessed/performed Overall Cognitive Status: Within Functional Limits for tasks assessed                                 General Comments: pt lethargic upon entry but awakens easily.  Internally distracted by pain and requires cueing for carryover of techniques but overall cognition appears WFL.        Exercises  General Comments General comments (skin integrity, edema, etc.): VSS      Pertinent Vitals/Pain Pain Assessment Pain Assessment: Faces Faces Pain Scale: Hurts even more Pain Location: RLE Pain Descriptors / Indicators: Discomfort, Grimacing, Crying, Moaning Pain Intervention(s): Limited activity within patient's tolerance, Monitored during session, Premedicated before session    Home Living                          Prior Function            PT Goals (current goals can now be found in the care plan section) Progress towards PT goals: Progressing  toward goals    Frequency    Min 1X/week      PT Plan      Co-evaluation              AM-PAC PT "6 Clicks" Mobility   Outcome Measure  Help needed turning from your back to your side while in a flat bed without using bedrails?: A Little Help needed moving from lying on your back to sitting on the side of a flat bed without using bedrails?: A Little Help needed moving to and from a bed to a chair (including a wheelchair)?: A Lot Help needed standing up from a chair using your arms (e.g., wheelchair or bedside chair)?: A Lot Help needed to walk in hospital room?: A Lot Help needed climbing 3-5 steps with a railing? : Total 6 Click Score: 13    End of Session Equipment Utilized During Treatment: Gait belt Activity Tolerance: Patient tolerated treatment well Patient left: in chair;with call bell/phone within reach;with chair alarm set Nurse Communication: Mobility status PT Visit Diagnosis: Other abnormalities of gait and mobility (R26.89)     Time: 0109-3235 PT Time Calculation (min) (ACUTE ONLY): 24 min  Charges:    $Gait Training: 8-22 mins $Therapeutic Activity: 8-22 mins PT General Charges $$ ACUTE PT VISIT: 1 Visit                     Shela Nevin, PT, DPT Acute Rehab Services [phone]    Jonathan Ibarra 12/17/2022, 1:34 PM

## 2022-12-17 NOTE — Progress Notes (Signed)
Ortho Trauma Note  Wound appears stable, just a small pinpoint amount of drainage. Dressing reapplied. Okay for discharge. Will recommend follow up in 1-2 weeks for wound check and x-rays.  Roby Lofts, MD [facility] [phone] (office) [email or web address]

## 2022-12-17 NOTE — Progress Notes (Signed)
Occupational Therapy Treatment Patient Details Name: Jonathan Ibarra MRN: [id number] DOB: 02-Jul-1965 Today's Date: 12/17/2022   History of present illness Pt is a 57 y/o male presenting 10/13 falling on river bed while [place] the woods. Found with R 8th and 9th rib fx, R proximal tibial fx. 10/14 S/P ORIF R tibial plateau and repair of R lateral mensicus. Jonathan Ibarra:WGNFAOZ abuse, [name], HTN, liver cirrhosis, polysubstance abuse.   OT comments  Patient supine in bed and requires encouragement to get OOB.  Patient premedicated, but continues to report pain in R LE.  Supervision to get to EOB, but continues to require assist to don socks (overall max assist for LB dressing).  He transfers to recliner using RW with min guard, ambulates into bathroom with min guard +2 safety using RW given cueing to ensure NWB to RLE.  Once returned to recliner, pt too fatigued to engage in AE education.  Will benefit from AE education next session.  Somewhat self limiting. Discussed need for assist with LB dressing, pt reports not having much support at home- MD notified. Will follow acutely, recommend HHOT services at dc.       If plan is discharge home, recommend the following:  A little help with walking and/or transfers;A lot of help with bathing/dressing/bathroom;Assistance with cooking/housework;Help with stairs or ramp for entrance;Assist for transportation   Equipment Recommendations  BSC/3in1    Recommendations for Other Services      Precautions / Restrictions Precautions Precautions: Fall Restrictions Weight Bearing Restrictions: Yes RLE Weight Bearing: Non weight bearing       Mobility Bed Mobility Overal bed mobility: Needs Assistance Bed Mobility: Supine to Sit     Supine to sit: Supervision     General bed mobility comments: for safety    Transfers Overall transfer level: Needs assistance Equipment used: Rolling walker (2 wheels) Transfers: Sit to/from Stand Sit to Stand:  Contact guard assist           General transfer comment: cueing for hand placement and safety     Balance Overall balance assessment: Needs assistance Sitting-balance support: No upper extremity supported, Feet supported Sitting balance-Leahy Scale: Fair     Standing balance support: Bilateral upper extremity supported, During functional activity Standing balance-Leahy Scale: Poor Standing balance comment: relies on RW                           ADL either performed or assessed with clinical judgement   ADL Overall ADL's : Needs assistance/impaired     Grooming: Set up;Sitting               Lower Body Dressing: Maximal assistance;Sit to/from stand Lower Body Dressing Details (indicate cue type and reason): pt requires assist for socks, attempted initation of AE training but pt too fatigued to engage Toilet Transfer: Contact guard assist;Rolling walker (2 wheels);BSC/3in1;Ambulation Toilet Transfer Details (indicate cue type and reason): 3:1 over commode using RW to hop step to bathroom Toileting- Clothing Manipulation and Hygiene: Minimal assistance;Sit to/from stand       Functional mobility during ADLs: Contact guard assist;+2 for safety/equipment;Rolling walker (2 wheels);Cueing for safety General ADL Comments: cueing to ensure NWB to R LE    Extremity/Trunk Assessment              Vision       Perception     Praxis      Cognition Arousal: Alert Behavior During Therapy: [name] for tasks  assessed/performed Overall Cognitive Status: Within Functional Limits for tasks assessed                                 General Comments: pt lethargic upon entry but awakens easily.  Internally distracted by pain and requires cueing for carryover of techniques but overall cognition appears WFL.        Exercises      Shoulder Instructions       General Comments      Pertinent Vitals/ Pain       Pain Assessment Pain Assessment:  Faces Pain Score: 6  Pain Location: RLE Pain Descriptors / Indicators: Discomfort, Grimacing, Crying, Moaning Pain Intervention(s): Limited activity within patient's tolerance, Monitored during session, Premedicated before session, Repositioned  Home Living                                          Prior Functioning/Environment              Frequency  Min 1X/week        Progress Toward Goals  OT Goals(current goals can now be found in the care plan section)  Progress towards OT goals: Progressing toward goals  Acute Rehab OT Goals Patient Stated Goal: home OT Goal Formulation: With patient Time For Goal Achievement: 12/28/22 Potential to Achieve Goals: Good  Plan      Co-evaluation                 AM-PAC OT "6 Clicks" Daily Activity     Outcome Measure   Help from another person eating meals?: None Help from another person taking care of personal grooming?: A Little Help from another person toileting, which includes using toliet, bedpan, or urinal?: A Little Help from another person bathing (including washing, rinsing, drying)?: A Lot Help from another person to put on and taking off regular upper body clothing?: A Little Help from another person to put on and taking off regular lower body clothing?: A Lot 6 Click Score: 17    End of Session Equipment Utilized During Treatment: Gait belt;Rolling walker (2 wheels)  OT Visit Diagnosis: Other abnormalities of gait and mobility (R26.89);Muscle weakness (generalized) (M62.81);Pain Pain - Right/Left: Right Pain - part of body: Leg   Activity Tolerance Patient tolerated treatment well   Patient Left in chair;with call bell/phone within reach;Other (comment) (with PT in bathroom)   Nurse Communication Mobility status        Time: 4403-4742 OT Time Calculation (min): 23 min  Charges: OT General Charges $OT Visit: 1 Visit OT Treatments $Self Care/Home Management : 23-37  mins  Barry Brunner, OT Acute Rehabilitation Services Office [phone]   Chancy Milroy 12/17/2022, 12:55 PM

## 2022-12-17 NOTE — Plan of Care (Signed)
[facility] Tonsillectomy/Adenoidectomy, Postoperative PEDS care plan entered in error.

## 2022-12-17 NOTE — TOC Progression Note (Addendum)
Transition of Care [facility]) - Progression Note    Patient Details  Name: Jonathan Ibarra MRN: [id number] Date of Birth: 1965-04-28  [facility] CM/SW Contact  Lorri Frederick, LCSW Phone Number: 12/17/2022, 11:53 AM  Clinical Narrative:   CSW spoke with pt regarding DC.  Pt stays with [name], listed on facesheet but not her phone number.  Pt says his phone is at the house, CSW confirmed address: [address], [place].  He does not have transportation home. Pt states he would prefer to stay another night, CSW informed him likely DC today.  Pt has walker, wheelchair, bedside commode in the room.  States 2 steps to get inside and he can manage that with walker.  He is requesting DC after 5pm, when [name] will be off work.    1420:wheelchair [facility] transport home set up with safe transport: [phone]. They will pick pt up at main N tower entrance around 515.    Expected Discharge Plan: Home w Home Health Services Barriers to Discharge: No Barriers Identified  Expected Discharge Plan and Services   Discharge Planning Services: CM Consult Post Acute Care Choice: Durable Medical Equipment, Home Health Living arrangements for the past 2 months: Apartment                 DME Arranged: Walker rolling, 3-N-1, Wheelchair manual DME Agency: [employer] Date DME Agency Contacted: 12/16/22 Time DME Agency Contacted: 1222 Representative spoke with at DME Agency: Shaune Leeks HH Arranged: PT HH Agency: [facility] Date [facility] Agency Contacted: 12/16/22 Time HH Agency Contacted: 1250 Representative spoke with at [facility] Agency: Amy Hyatt   Social Determinants of Health (SDOH) Interventions SDOH Screenings   Food Insecurity: No Food Insecurity (10/01/2022)  Housing: Low Risk  (10/01/2022)  Transportation Needs: No Transportation Needs (10/01/2022)  Utilities: Not At Risk (10/01/2022)  Financial Resource Strain: Medium Risk (01/07/2020)   Received from [facility],  [facility]  Social Connections: Unknown (07/05/2021)   Received from [facility], [facility]  Stress: No Stress Concern Present (01/07/2020)   Received from [facility], [facility]  Tobacco Use: High Risk (12/13/2022)    Readmission Risk Interventions     No data to display
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
## 2022-12-17 NOTE — Progress Notes (Signed)
Physical Therapy Treatment Patient Details Name: Roi Hjort MRN: [id number] DOB: 1965/05/13 Today's Date: 12/17/2022   History of Present Illness Pt is a 57 y/o male presenting 10/13 [place]. Found with R 8th and 9th rib fx, R proximal tibial fx. 10/14 S/P ORIF R tibial plateau and repair of R lateral mensicus. WUJ:WJXBJYN abuse, [name], HTN, liver cirrhosis, polysubstance abuse.    PT Comments  Second session today focused on education on how to navigate stairs to enter home. Discussed RW backwards method vs WC method. Handout provided. Pt declined to practice RW method and agreed that [facility] method was safest for currently. Pt states that his friend will be able to assist with WC method upon DC. No change in DC/DME recs at this time. Pt anticipates DC home today.   If plan is discharge home, recommend the following: A lot of help with walking and/or transfers;[name];Assistance with cooking/housework;Assist for transportation;Help with stairs or ramp for entrance   Can travel by private vehicle        Equipment Recommendations  Rolling walker (2 wheels);BSC/3in1;Wheelchair (measurements PT);Wheelchair cushion (measurements PT)    Recommendations for Other Services       Precautions / Restrictions Precautions Precautions: Fall Restrictions Weight Bearing Restrictions: Yes RLE Weight Bearing: Non weight bearing     Mobility  Bed Mobility Overal bed mobility: Needs Assistance Bed Mobility: Supine to Sit     Supine to sit: Supervision     General bed mobility comments: for safety    Transfers Overall transfer level: Needs assistance Equipment used: Rolling walker (2 wheels) Transfers: Sit to/from Stand Sit to Stand: Contact guard assist           General transfer comment: cueing for hand placement and safety    Ambulation/Gait Ambulation/Gait assistance: Contact guard assist, +2  safety/equipment (Chair follow) Gait Distance (Feet): 20 Feet Assistive device: Rolling walker (2 wheels) Gait Pattern/deviations:  (hop to) Gait velocity: decreased     General Gait Details: Hop to gait pattern however once fatigued began to WB through RLE requiring max cues for safety. Chair follow provided   Stairs             Wheelchair Mobility     Tilt Bed    Modified Rankin (Stroke Patients Only)       Balance Overall balance assessment: Needs assistance Sitting-balance support: No upper extremity supported, Feet supported Sitting balance-Leahy Scale: Fair     Standing balance support: Bilateral upper extremity supported, During functional activity Standing balance-Leahy Scale: Poor Standing balance comment: relies on RW                            Cognition Arousal: Alert Behavior During Therapy: WFL for tasks assessed/performed Overall Cognitive Status: Within Functional Limits for tasks assessed                                 General Comments: pt lethargic upon entry but awakens easily.  Internally distracted by pain and requires cueing for carryover of techniques but overall cognition appears WFL.        Exercises      General Comments General comments (skin integrity, edema, etc.): Education on how to navigate stairs to enter home. Discussed RW backwards method vs WC method. Pt declined to practice RW method and agreed  that WC method was safest for currently. Pt states that his friend will be able to assist with WC method upon DC.      Pertinent Vitals/Pain Pain Assessment Pain Assessment: Faces Faces Pain Scale: Hurts little more Pain Location: RLE Pain Descriptors / Indicators: Discomfort, Grimacing, Crying, Moaning Pain Intervention(s): Monitored during session, Limited activity within patient's tolerance    Home Living                          Prior Function            PT Goals (current goals  can now be found in the care plan section) Progress towards PT goals: Progressing toward goals    Frequency    Min 1X/week      PT Plan      Co-evaluation              AM-PAC PT "6 Clicks" Mobility   Outcome Measure  Help needed turning from your back to your side while in a flat bed without using bedrails?: A Little Help needed moving from lying on your back to sitting on the side of a flat bed without using bedrails?: A Little Help needed moving to and from a bed to a chair (including a wheelchair)?: A Lot Help needed standing up from a chair using your arms (e.g., wheelchair or bedside chair)?: A Lot Help needed to walk in hospital room?: A Lot Help needed climbing 3-5 steps with a railing? : Total 6 Click Score: 13    End of Session Equipment Utilized During Treatment: Gait belt Activity Tolerance: Patient tolerated treatment well Patient left: in bed;with call bell/phone within reach Nurse Communication: Mobility status PT Visit Diagnosis: Other abnormalities of gait and mobility (R26.89)     Time: 1610-9604 PT Time Calculation (min) (ACUTE ONLY): 8 min  Charges:    $Gait Training: 8-22 mins $Therapeutic Activity: 8-22 mins $Self Care/Home Management: 8-22 PT General Charges $$ ACUTE PT VISIT: 1 Visit                     Shela Nevin, PT, DPT Acute Rehab Services [phone]    Gladys Damme 12/17/2022, 3:51 PM

## 2022-12-23 ENCOUNTER — Encounter (HOSPITAL_COMMUNITY): Payer: Self-pay | Admitting: Student

## 2022-12-27 ENCOUNTER — Inpatient Hospital Stay (HOSPITAL_COMMUNITY)
Admission: EM | Admit: 2022-12-27 | Discharge: 2023-02-04 | DRG: 857 | Disposition: A | Discharge status: Home Health | Payer: Commercial Managed Care - HMO | Attending: Internal Medicine | Admitting: Internal Medicine

## 2022-12-27 ENCOUNTER — Other Ambulatory Visit: Payer: Self-pay

## 2022-12-27 ENCOUNTER — Emergency Department (HOSPITAL_COMMUNITY): Payer: Commercial Managed Care - HMO

## 2022-12-27 ENCOUNTER — Emergency Department (HOSPITAL_COMMUNITY)
Admit: 2022-12-27 | Discharge: 2022-12-27 | Disposition: A | Discharge status: Home / Self Care | Payer: Commercial Managed Care - HMO | Attending: Emergency Medicine | Admitting: Emergency Medicine

## 2022-12-27 DIAGNOSIS — K7030 Alcoholic cirrhosis of liver without ascites: Secondary | ICD-10-CM | POA: Diagnosis present

## 2022-12-27 DIAGNOSIS — M25461 Effusion, right knee: Principal | ICD-10-CM | POA: Diagnosis present

## 2022-12-27 DIAGNOSIS — W19XXXA Unspecified fall, initial encounter: Secondary | ICD-10-CM

## 2022-12-27 DIAGNOSIS — R001 Bradycardia, unspecified: Secondary | ICD-10-CM | POA: Diagnosis not present

## 2022-12-27 DIAGNOSIS — I480 Paroxysmal atrial fibrillation: Secondary | ICD-10-CM | POA: Diagnosis present

## 2022-12-27 DIAGNOSIS — Z1612 Extended spectrum beta lactamase (ESBL) resistance: Secondary | ICD-10-CM | POA: Diagnosis present

## 2022-12-27 DIAGNOSIS — M79661 Pain in right lower leg: Secondary | ICD-10-CM | POA: Diagnosis not present

## 2022-12-27 DIAGNOSIS — Z9049 Acquired absence of other specified parts of digestive tract: Secondary | ICD-10-CM

## 2022-12-27 DIAGNOSIS — G40909 Epilepsy, unspecified, not intractable, without status epilepticus: Secondary | ICD-10-CM | POA: Diagnosis present

## 2022-12-27 DIAGNOSIS — Z79899 Other long term (current) drug therapy: Secondary | ICD-10-CM

## 2022-12-27 DIAGNOSIS — E669 Obesity, unspecified: Secondary | ICD-10-CM | POA: Diagnosis present

## 2022-12-27 DIAGNOSIS — F17290 Nicotine dependence, other tobacco product, uncomplicated: Secondary | ICD-10-CM | POA: Diagnosis present

## 2022-12-27 DIAGNOSIS — B961 Klebsiella pneumoniae [K. pneumoniae] as the cause of diseases classified elsewhere: Secondary | ICD-10-CM | POA: Diagnosis present

## 2022-12-27 DIAGNOSIS — F1210 Cannabis abuse, uncomplicated: Secondary | ICD-10-CM | POA: Diagnosis present

## 2022-12-27 DIAGNOSIS — F1410 Cocaine abuse, uncomplicated: Secondary | ICD-10-CM | POA: Diagnosis present

## 2022-12-27 DIAGNOSIS — B1920 Unspecified viral hepatitis C without hepatic coma: Secondary | ICD-10-CM | POA: Diagnosis present

## 2022-12-27 DIAGNOSIS — Y838 Other surgical procedures as the cause of abnormal reaction of the patient, or of later complication, without mention of misadventure at the time of the procedure: Secondary | ICD-10-CM | POA: Diagnosis present

## 2022-12-27 DIAGNOSIS — T148XXA Other injury of unspecified body region, initial encounter: Secondary | ICD-10-CM

## 2022-12-27 DIAGNOSIS — I10 Essential (primary) hypertension: Secondary | ICD-10-CM | POA: Diagnosis present

## 2022-12-27 DIAGNOSIS — T8140XA Infection following a procedure, unspecified, initial encounter: Secondary | ICD-10-CM | POA: Diagnosis not present

## 2022-12-27 DIAGNOSIS — L02415 Cutaneous abscess of right lower limb: Secondary | ICD-10-CM | POA: Diagnosis present

## 2022-12-27 DIAGNOSIS — R000 Tachycardia, unspecified: Secondary | ICD-10-CM | POA: Diagnosis not present

## 2022-12-27 DIAGNOSIS — Z23 Encounter for immunization: Secondary | ICD-10-CM

## 2022-12-27 DIAGNOSIS — M00861 Arthritis due to other bacteria, right knee: Secondary | ICD-10-CM | POA: Diagnosis present

## 2022-12-27 DIAGNOSIS — I4891 Unspecified atrial fibrillation: Secondary | ICD-10-CM | POA: Diagnosis present

## 2022-12-27 DIAGNOSIS — F1110 Opioid abuse, uncomplicated: Secondary | ICD-10-CM | POA: Diagnosis present

## 2022-12-27 DIAGNOSIS — T847XXD Infection and inflammatory reaction due to other internal orthopedic prosthetic devices, implants and grafts, subsequent encounter: Secondary | ICD-10-CM

## 2022-12-27 DIAGNOSIS — Z6836 Body mass index (BMI) 36.0-36.9, adult: Secondary | ICD-10-CM

## 2022-12-27 DIAGNOSIS — M009 Pyogenic arthritis, unspecified: Secondary | ICD-10-CM

## 2022-12-27 DIAGNOSIS — Z1629 Resistance to other single specified antibiotic: Secondary | ICD-10-CM | POA: Diagnosis present

## 2022-12-27 DIAGNOSIS — Z886 Allergy status to analgesic agent status: Secondary | ICD-10-CM

## 2022-12-27 DIAGNOSIS — F17210 Nicotine dependence, cigarettes, uncomplicated: Secondary | ICD-10-CM | POA: Diagnosis present

## 2022-12-27 DIAGNOSIS — Z9181 History of falling: Secondary | ICD-10-CM

## 2022-12-27 DIAGNOSIS — M25561 Pain in right knee: Secondary | ICD-10-CM | POA: Diagnosis not present

## 2022-12-27 LAB — CBC WITH DIFFERENTIAL/PLATELET
Abs Immature Granulocytes: 0.03 10*3/uL (ref 0.00–0.07)
Basophils Absolute: 0.1 10*3/uL (ref 0.0–0.1)
Basophils Relative: 1 %
Eosinophils Absolute: 0.2 10*3/uL (ref 0.0–0.5)
Eosinophils Relative: 2 %
HCT: 40.7 % (ref 39.0–52.0)
Hemoglobin: 13.3 g/dL (ref 13.0–17.0)
Immature Granulocytes: 0 %
Lymphocytes Relative: 12 %
Lymphs Abs: 1.2 10*3/uL (ref 0.7–4.0)
MCH: 30.2 pg (ref 26.0–34.0)
MCHC: 32.7 g/dL (ref 30.0–36.0)
MCV: 92.5 fL (ref 80.0–100.0)
Monocytes Absolute: 1.1 10*3/uL — ABNORMAL HIGH (ref 0.1–1.0)
Monocytes Relative: 11 %
Neutro Abs: 7.5 10*3/uL (ref 1.7–7.7)
Neutrophils Relative %: 74 %
Platelets: 401 10*3/uL — ABNORMAL HIGH (ref 150–400)
RBC: 4.4 MIL/uL (ref 4.22–5.81)
RDW: 14.3 % (ref 11.5–15.5)
WBC: 10.2 10*3/uL (ref 4.0–10.5)
nRBC: 0 % (ref 0.0–0.2)

## 2022-12-27 LAB — COMPREHENSIVE METABOLIC PANEL
ALT: 14 U/L (ref 0–44)
AST: 23 U/L (ref 15–41)
Albumin: 2.3 g/dL — ABNORMAL LOW (ref 3.5–5.0)
Alkaline Phosphatase: 114 U/L (ref 38–126)
Anion gap: 4 — ABNORMAL LOW (ref 5–15)
BUN: 7 mg/dL (ref 6–20)
CO2: 25 mmol/L (ref 22–32)
Calcium: 8.6 mg/dL — ABNORMAL LOW (ref 8.9–10.3)
Chloride: 103 mmol/L (ref 98–111)
Creatinine, Ser: 0.75 mg/dL (ref 0.61–1.24)
GFR, Estimated: >60 mL/min (ref >60)
Glucose, Bld: 109 mg/dL — ABNORMAL HIGH (ref 70–99)
Potassium: 3.8 mmol/L (ref 3.5–5.1)
Sodium: 132 mmol/L — ABNORMAL LOW (ref 135–145)
Total Bilirubin: 2.2 mg/dL — ABNORMAL HIGH (ref 0.3–1.2)
Total Protein: 7.2 g/dL (ref 6.5–8.1)

## 2022-12-27 MED ORDER — OXYCODONE HCL 5 MG PO TABS
5.0000 mg | ORAL_TABLET | Freq: Once | ORAL | Status: AC
  Administered 2022-12-27: 5 mg via ORAL
  Filled 2022-12-27: qty 1

## 2022-12-27 NOTE — Progress Notes (Signed)
Right lower extremity venous duplex has been completed. Preliminary results can be found in CV Proc through chart review.  Results were given to Dr. Dalene Seltzer.  12/27/22 5:16 PM Olen Cordial RVT

## 2022-12-27 NOTE — ED Provider Triage Note (Addendum)
Emergency Medicine Provider Triage Evaluation Note  Jeanmichael Holway , a 57 y.o. male  was evaluated in triage.  Pt complains of right knee pain s/p ORIF performed 10/14 with Dr. Jena Gauss, reports drainage, increased pain after a fall 2 days ago.  Denies head trauma, headache, LOC.  Review of Systems  Positive: Pain to right knee Negative: Fever, nausea, vomiting  Physical Exam  BP 103/71 (BP Location: Left Arm)   Pulse 83   Temp (!) 97.4 F (36.3 C)   Resp 20   SpO2 100%  Gen:   Awake, in pain Resp:  Normal effort  MSK:   Right knee with swelling, drainage inferior portion appears serous/yellow 2+DP pulses, tenderness extending down calf on right    Medical Decision Making  Medically screening exam initiated at 4:34 PM.  Appropriate orders placed.  [name] was informed that the remainder of the evaluation will be completed by another provider, this initial triage assessment does not replace that evaluation, and the importance of remaining in the ED until their evaluation is complete.  Order labs including CBC, CMP.  Ordered x-ray of the knee and DVT study. Discussed with Dr. Haddix--recommends x-ray, labs.  If he requires admission, he recommends admission to the hospitalist service.  If there is lower concern for infection and we feel he can follow-up as an outpatient, he has availability in his office tomorrow to see him---in that case, would recommend he stop all anticoagulation, provide him a compressive wrap for his wound, adaptic, 4x4s, abd pad and ACE wrap, start on doxycyline and see him tomorrow in the office.    Alvira Monday, MD 12/27/22 1654    Alvira Monday, MD 12/27/22 1735

## 2022-12-27 NOTE — ED Triage Notes (Addendum)
Pt to ED via [facility] from home. Pt states he had a 6 foot fall 2 weeks ago and injured his right leg that he recently had surgery on 2 weeks ago to same leg and has missed follow up appointments. Pt was also prescribed pain meds and ABX at home but states he has not been able to pick up his ABX prescription. Pt states he has had pus and drainage coming from post op site since surgery. Pt's leg wrapped and bandaged upon arrival to ED. Pt states he had another fall 2 days ago onto right leg again. Pt states he was reaching for something then fell backwards. Pt denies any dizziness at time of fall and states he lost his balance and stumbled then fell. Pt denies LOC, pt denies hitting head, pt denies blood thinners. Pt c/o right leg pain.    EMS: 140/80 58 HR 98.6 temp 111 CBG

## 2022-12-28 ENCOUNTER — Emergency Department (HOSPITAL_COMMUNITY): Payer: Commercial Managed Care - HMO

## 2022-12-28 ENCOUNTER — Encounter (HOSPITAL_COMMUNITY): Payer: Self-pay | Admitting: Internal Medicine

## 2022-12-28 DIAGNOSIS — I480 Paroxysmal atrial fibrillation: Secondary | ICD-10-CM

## 2022-12-28 DIAGNOSIS — M009 Pyogenic arthritis, unspecified: Secondary | ICD-10-CM | POA: Diagnosis present

## 2022-12-28 DIAGNOSIS — F1410 Cocaine abuse, uncomplicated: Secondary | ICD-10-CM | POA: Diagnosis present

## 2022-12-28 DIAGNOSIS — G40909 Epilepsy, unspecified, not intractable, without status epilepticus: Secondary | ICD-10-CM | POA: Diagnosis present

## 2022-12-28 DIAGNOSIS — L02415 Cutaneous abscess of right lower limb: Secondary | ICD-10-CM | POA: Diagnosis present

## 2022-12-28 DIAGNOSIS — K7030 Alcoholic cirrhosis of liver without ascites: Secondary | ICD-10-CM | POA: Diagnosis present

## 2022-12-28 DIAGNOSIS — R000 Tachycardia, unspecified: Secondary | ICD-10-CM | POA: Diagnosis not present

## 2022-12-28 DIAGNOSIS — F1910 Other psychoactive substance abuse, uncomplicated: Secondary | ICD-10-CM

## 2022-12-28 DIAGNOSIS — F1110 Opioid abuse, uncomplicated: Secondary | ICD-10-CM | POA: Diagnosis present

## 2022-12-28 DIAGNOSIS — I482 Chronic atrial fibrillation, unspecified: Secondary | ICD-10-CM | POA: Diagnosis not present

## 2022-12-28 DIAGNOSIS — F1210 Cannabis abuse, uncomplicated: Secondary | ICD-10-CM | POA: Diagnosis present

## 2022-12-28 DIAGNOSIS — T148XXA Other injury of unspecified body region, initial encounter: Secondary | ICD-10-CM | POA: Diagnosis not present

## 2022-12-28 DIAGNOSIS — E669 Obesity, unspecified: Secondary | ICD-10-CM | POA: Diagnosis present

## 2022-12-28 DIAGNOSIS — T8142XA Infection following a procedure, deep incisional surgical site, initial encounter: Secondary | ICD-10-CM | POA: Diagnosis not present

## 2022-12-28 DIAGNOSIS — T8140XA Infection following a procedure, unspecified, initial encounter: Secondary | ICD-10-CM | POA: Diagnosis present

## 2022-12-28 DIAGNOSIS — T8453XA Infection and inflammatory reaction due to internal right knee prosthesis, initial encounter: Secondary | ICD-10-CM | POA: Diagnosis not present

## 2022-12-28 DIAGNOSIS — Y838 Other surgical procedures as the cause of abnormal reaction of the patient, or of later complication, without mention of misadventure at the time of the procedure: Secondary | ICD-10-CM | POA: Diagnosis present

## 2022-12-28 DIAGNOSIS — R7881 Bacteremia: Secondary | ICD-10-CM | POA: Diagnosis not present

## 2022-12-28 DIAGNOSIS — T847XXD Infection and inflammatory reaction due to other internal orthopedic prosthetic devices, implants and grafts, subsequent encounter: Secondary | ICD-10-CM | POA: Diagnosis not present

## 2022-12-28 DIAGNOSIS — Z1612 Extended spectrum beta lactamase (ESBL) resistance: Secondary | ICD-10-CM | POA: Diagnosis present

## 2022-12-28 DIAGNOSIS — I2510 Atherosclerotic heart disease of native coronary artery without angina pectoris: Secondary | ICD-10-CM | POA: Diagnosis not present

## 2022-12-28 DIAGNOSIS — T8141XA Infection following a procedure, superficial incisional surgical site, initial encounter: Secondary | ICD-10-CM | POA: Diagnosis not present

## 2022-12-28 DIAGNOSIS — B961 Klebsiella pneumoniae [K. pneumoniae] as the cause of diseases classified elsewhere: Secondary | ICD-10-CM | POA: Diagnosis present

## 2022-12-28 DIAGNOSIS — F17290 Nicotine dependence, other tobacco product, uncomplicated: Secondary | ICD-10-CM | POA: Diagnosis present

## 2022-12-28 DIAGNOSIS — M25461 Effusion, right knee: Principal | ICD-10-CM | POA: Diagnosis present

## 2022-12-28 DIAGNOSIS — Z9181 History of falling: Secondary | ICD-10-CM | POA: Diagnosis not present

## 2022-12-28 DIAGNOSIS — M00861 Arthritis due to other bacteria, right knee: Secondary | ICD-10-CM | POA: Diagnosis present

## 2022-12-28 DIAGNOSIS — Z1629 Resistance to other single specified antibiotic: Secondary | ICD-10-CM | POA: Diagnosis present

## 2022-12-28 DIAGNOSIS — F17210 Nicotine dependence, cigarettes, uncomplicated: Secondary | ICD-10-CM | POA: Diagnosis present

## 2022-12-28 DIAGNOSIS — Z6836 Body mass index (BMI) 36.0-36.9, adult: Secondary | ICD-10-CM | POA: Diagnosis not present

## 2022-12-28 DIAGNOSIS — Z79899 Other long term (current) drug therapy: Secondary | ICD-10-CM | POA: Diagnosis not present

## 2022-12-28 DIAGNOSIS — I4891 Unspecified atrial fibrillation: Secondary | ICD-10-CM | POA: Diagnosis not present

## 2022-12-28 DIAGNOSIS — M25561 Pain in right knee: Secondary | ICD-10-CM | POA: Diagnosis present

## 2022-12-28 DIAGNOSIS — Z23 Encounter for immunization: Secondary | ICD-10-CM | POA: Diagnosis not present

## 2022-12-28 DIAGNOSIS — I10 Essential (primary) hypertension: Secondary | ICD-10-CM | POA: Diagnosis present

## 2022-12-28 DIAGNOSIS — B1920 Unspecified viral hepatitis C without hepatic coma: Secondary | ICD-10-CM | POA: Diagnosis present

## 2022-12-28 HISTORY — DX: Paroxysmal atrial fibrillation: I48.0

## 2022-12-28 LAB — RAPID URINE DRUG SCREEN, HOSP PERFORMED
Amphetamines: POSITIVE — AB
Barbiturates: NOT DETECTED
Benzodiazepines: NOT DETECTED
Cocaine: POSITIVE — AB
Opiates: NOT DETECTED
Tetrahydrocannabinol: POSITIVE — AB

## 2022-12-28 LAB — TROPONIN I (HIGH SENSITIVITY)
Troponin I (High Sensitivity): 7 ng/L (ref <18)
Troponin I (High Sensitivity): 7 ng/L (ref <18)

## 2022-12-28 MED ORDER — SPIRONOLACTONE 25 MG PO TABS
25.0000 mg | ORAL_TABLET | Freq: Every day | ORAL | Status: DC
  Administered 2022-12-28 – 2023-01-03 (×6): 25 mg via ORAL
  Filled 2022-12-28 (×2): qty 1
  Filled 2022-12-28: qty 2
  Filled 2022-12-28 (×3): qty 1

## 2022-12-28 MED ORDER — ACETAMINOPHEN 325 MG PO TABS
325.0000 mg | ORAL_TABLET | Freq: Four times a day (QID) | ORAL | Status: DC | PRN
  Administered 2023-01-30 – 2023-02-01 (×3): 325 mg via ORAL
  Filled 2022-12-28 (×3): qty 1

## 2022-12-28 MED ORDER — METOPROLOL TARTRATE 25 MG PO TABS
25.0000 mg | ORAL_TABLET | Freq: Two times a day (BID) | ORAL | Status: DC
  Administered 2022-12-28 – 2022-12-31 (×5): 25 mg via ORAL
  Filled 2022-12-28 (×6): qty 1

## 2022-12-28 MED ORDER — DILTIAZEM HCL-DEXTROSE 125-5 MG/125ML-% IV SOLN (PREMIX)
INTRAVENOUS | Status: AC
  Administered 2022-12-28: 5 mg/h via INTRAVENOUS
  Filled 2022-12-28: qty 125

## 2022-12-28 MED ORDER — TETANUS-DIPHTH-ACELL PERTUSSIS 5-2.5-18.5 LF-MCG/0.5 IM SUSY
0.5000 mL | PREFILLED_SYRINGE | Freq: Once | INTRAMUSCULAR | Status: DC
  Filled 2022-12-28: qty 0.5

## 2022-12-28 MED ORDER — VANCOMYCIN HCL IN DEXTROSE 1-5 GM/200ML-% IV SOLN
1000.0000 mg | Freq: Three times a day (TID) | INTRAVENOUS | Status: DC
  Administered 2022-12-28 – 2022-12-31 (×9): 1000 mg via INTRAVENOUS
  Filled 2022-12-28 (×9): qty 200

## 2022-12-28 MED ORDER — PIPERACILLIN-TAZOBACTAM 3.375 G IVPB
3.3750 g | Freq: Three times a day (TID) | INTRAVENOUS | Status: DC
  Administered 2022-12-28 – 2022-12-30 (×6): 3.375 g via INTRAVENOUS
  Filled 2022-12-28 (×6): qty 50

## 2022-12-28 MED ORDER — DOXYCYCLINE HYCLATE 100 MG IV SOLR
100.0000 mg | Freq: Two times a day (BID) | INTRAVENOUS | Status: DC
  Administered 2022-12-28: 100 mg via INTRAVENOUS
  Filled 2022-12-28: qty 100

## 2022-12-28 MED ORDER — PIPERACILLIN-TAZOBACTAM 3.375 G IVPB 30 MIN
3.3750 g | Freq: Once | INTRAVENOUS | Status: AC
Start: 2022-12-28 — End: 2022-12-28
  Administered 2022-12-28: 3.375 g via INTRAVENOUS
  Filled 2022-12-28: qty 50

## 2022-12-28 MED ORDER — KETOROLAC TROMETHAMINE 30 MG/ML IJ SOLN
30.0000 mg | Freq: Once | INTRAMUSCULAR | Status: AC
  Administered 2022-12-28: 30 mg via INTRAVENOUS
  Filled 2022-12-28: qty 1

## 2022-12-28 MED ORDER — DILTIAZEM HCL-DEXTROSE 125-5 MG/125ML-% IV SOLN (PREMIX)
5.0000 mg/h | INTRAVENOUS | Status: DC
Start: 2022-12-28 — End: 2023-01-01
  Administered 2022-12-28: 15 mg/h via INTRAVENOUS
  Administered 2022-12-30 – 2022-12-31 (×2): 5 mg/h via INTRAVENOUS
  Administered 2022-12-31: 10 mg/h via INTRAVENOUS
  Administered 2023-01-01: 5 mg/h via INTRAVENOUS
  Filled 2022-12-28 (×5): qty 125

## 2022-12-28 MED ORDER — FUROSEMIDE 40 MG PO TABS
40.0000 mg | ORAL_TABLET | Freq: Every day | ORAL | Status: DC
  Administered 2022-12-30 – 2023-01-03 (×5): 40 mg via ORAL
  Filled 2022-12-28 (×5): qty 1

## 2022-12-28 MED ORDER — THIAMINE MONONITRATE 100 MG PO TABS
100.0000 mg | ORAL_TABLET | Freq: Every day | ORAL | Status: DC
  Administered 2022-12-28 – 2023-02-04 (×37): 100 mg via ORAL
  Filled 2022-12-28 (×41): qty 1

## 2022-12-28 MED ORDER — FOLIC ACID 1 MG PO TABS
1.0000 mg | ORAL_TABLET | Freq: Every day | ORAL | Status: DC
  Administered 2022-12-28 – 2023-02-04 (×37): 1 mg via ORAL
  Filled 2022-12-28 (×38): qty 1

## 2022-12-28 MED ORDER — VANCOMYCIN HCL IN DEXTROSE 1-5 GM/200ML-% IV SOLN: 1000.0000 mg | Freq: Once | INTRAVENOUS | Status: DC

## 2022-12-28 MED ORDER — OXYCODONE HCL 5 MG PO TABS
10.0000 mg | ORAL_TABLET | ORAL | Status: DC | PRN
  Administered 2022-12-28 – 2023-01-10 (×43): 10 mg via ORAL
  Filled 2022-12-28 (×44): qty 2

## 2022-12-28 MED ORDER — FUROSEMIDE 20 MG PO TABS: 40.0000 mg | ORAL_TABLET | Freq: Every day | ORAL | Status: DC

## 2022-12-28 MED ORDER — VANCOMYCIN HCL 2000 MG/400ML IV SOLN
2000.0000 mg | Freq: Once | INTRAVENOUS | Status: AC
  Administered 2022-12-28: 2000 mg via INTRAVENOUS
  Filled 2022-12-28: qty 400

## 2022-12-28 MED ORDER — POLYETHYLENE GLYCOL 3350 17 G PO PACK
17.0000 g | PACK | Freq: Every day | ORAL | Status: DC | PRN
Start: 2022-12-28 — End: 2023-02-04

## 2022-12-28 MED ORDER — OXYCODONE HCL 5 MG PO TABS
5.0000 mg | ORAL_TABLET | ORAL | Status: DC | PRN
  Filled 2022-12-28: qty 1

## 2022-12-28 MED ORDER — SODIUM CHLORIDE 0.9% FLUSH: 3.0000 mL | INTRAVENOUS | Status: DC | PRN

## 2022-12-28 MED ORDER — METHOCARBAMOL 1000 MG/10ML IJ SOLN: 500.0000 mg | Freq: Four times a day (QID) | INTRAMUSCULAR | Status: DC | PRN

## 2022-12-28 MED ORDER — METHOCARBAMOL 500 MG PO TABS
750.0000 mg | ORAL_TABLET | Freq: Four times a day (QID) | ORAL | Status: DC | PRN
  Administered 2022-12-28 – 2023-02-04 (×17): 750 mg via ORAL
  Filled 2022-12-28 (×19): qty 2

## 2022-12-28 MED ORDER — DILTIAZEM LOAD VIA INFUSION
10.0000 mg | Freq: Once | INTRAVENOUS | Status: AC
  Administered 2022-12-28: 10 mg via INTRAVENOUS
  Filled 2022-12-28: qty 10

## 2022-12-28 MED ORDER — LACTULOSE 10 GM/15ML PO SOLN
20.0000 g | Freq: Two times a day (BID) | ORAL | Status: DC
  Administered 2022-12-29 – 2023-01-06 (×9): 20 g via ORAL
  Filled 2022-12-28 (×17): qty 30

## 2022-12-28 MED ORDER — SODIUM CHLORIDE 0.9% FLUSH
3.0000 mL | Freq: Two times a day (BID) | INTRAVENOUS | Status: DC
  Administered 2022-12-28 – 2023-02-04 (×65): 3 mL via INTRAVENOUS

## 2022-12-28 MED ORDER — DOXYCYCLINE HYCLATE 100 MG PO TABS
100.0000 mg | ORAL_TABLET | Freq: Once | ORAL | Status: AC
  Administered 2022-12-28: 100 mg via ORAL
  Filled 2022-12-28: qty 1

## 2022-12-28 NOTE — Progress Notes (Signed)
Orthopaedic Trauma Progress Note  SUBJECTIVE: Patient presented to the emergency department yesterday 12/27/2022 for right knee pain and continued wound drainage following a fall.  Patient underwent ORIF of right tibial plateau fracture by Dr. Jena Gauss on 12/13/2022.  Notes that he has had drainage since surgery.  Initially plan for patient to be started on antibiotics and discharged home with outpatient follow-up with orthopedics today but unfortunately patient went into A-fib with RVR while in the emergency department.  Was admitted to the medical service.  Orthopedics asked to evaluate patient's right knee due to concern for infection.  Patient seen this morning in the emergency department.  Notes that he has had drainage since initial surgery.  He reports not having supplies to clean the surgical site or change dressings at home and notes an increased amount of tan/yellow purulent discharge coming from site over the last week. He also reports having significant pain at the surgical site since discharge because oxycodone was decreased to 5 mg when he was previously on 10 mg in the hospital.  Denies any fever or chills.  Denies chest pain. No SOB. No nausea/vomiting. No other complaints.   OBJECTIVE:  Vitals:   12/28/22 0715 12/28/22 1039  BP: (!) 105/58 (!) 100/55  Pulse: (!) 106 (!) 59  Resp: 16 16  Temp:  (!) 97.5 F (36.4 C)  SpO2: 98% 94%    General: Resting in bed, no acute distress Respiratory: No increased work of breathing.  Right lower extremity:  + Joint effusion.  Sutures in place over knee incision are stable.  Incision with surrounding redness.  Distal portion of the incision with purulent drainage.  Remainder of incision appears stable.  Pain with motion of the knee.  Ankle dorsiflexion/plantarflexion is intact but patient had some discomfort with this.  No significant increase in pain with passive stretch of the toes.  Global soreness/tenderness to the calf but no areas of  significant increase in pain.  + EHL/FHL intact.  Neurovascularly intact.  IMAGING: Repeat imaging right knee performed 12/27/2022 shows stable appearance to plate and screw fixation.  Fracture appears stable.  No change in alignment.  No evidence of new fracture.  Patient with moderate knee joint effusion.  LABS:  Results for orders placed or performed during the hospital encounter of 12/27/22 (from the past 24 hour(s))  Comprehensive metabolic panel     Status: Abnormal   Collection Time: 12/27/22  4:47 PM  Result Value Ref Range   Sodium 132 (L) 135 - 145 mmol/L   Potassium 3.8 3.5 - 5.1 mmol/L   Chloride 103 98 - 111 mmol/L   CO2 25 22 - 32 mmol/L   Glucose, Bld 109 (H) 70 - 99 mg/dL   BUN 7 6 - 20 mg/dL   Creatinine, Ser 1.61 0.61 - 1.24 mg/dL   Calcium 8.6 (L) 8.9 - 10.3 mg/dL   Total Protein 7.2 6.5 - 8.1 g/dL   Albumin 2.3 (L) 3.5 - 5.0 g/dL   AST 23 15 - 41 U/L   ALT 14 0 - 44 U/L   Alkaline Phosphatase 114 38 - 126 U/L   Total Bilirubin 2.2 (H) 0.3 - 1.2 mg/dL   GFR, Estimated >09 >60 mL/min   Anion gap 4 (L) 5 - 15  CBC with Differential/Platelet     Status: Abnormal   Collection Time: 12/27/22  4:57 PM  Result Value Ref Range   WBC 10.2 4.0 - 10.5 K/uL   RBC 4.40 4.22 - 5.81 MIL/uL  Hemoglobin 13.3 13.0 - 17.0 g/dL   HCT 09.8 11.9 - 14.7 %   MCV 92.5 80.0 - 100.0 fL   MCH 30.2 26.0 - 34.0 pg   MCHC 32.7 30.0 - 36.0 g/dL   RDW 82.9 56.2 - 13.0 %   Platelets 401 (H) 150 - 400 K/uL   nRBC 0.0 0.0 - 0.2 %   Neutrophils Relative % 74 %   Neutro Abs 7.5 1.7 - 7.7 K/uL   Lymphocytes Relative 12 %   Lymphs Abs 1.2 0.7 - 4.0 K/uL   Monocytes Relative 11 %   Monocytes Absolute 1.1 (H) 0.1 - 1.0 K/uL   Eosinophils Relative 2 %   Eosinophils Absolute 0.2 0.0 - 0.5 K/uL   Basophils Relative 1 %   Basophils Absolute 0.1 0.0 - 0.1 K/uL   Immature Granulocytes 0 %   Abs Immature Granulocytes 0.03 0.00 - 0.07 K/uL  Troponin I (High Sensitivity)     Status: None    Collection Time: 12/28/22  1:29 AM  Result Value Ref Range   Troponin I (High Sensitivity) 7 <18 ng/L  Rapid urine drug screen (hospital performed)     Status: Abnormal   Collection Time: 12/28/22  3:03 AM  Result Value Ref Range   Opiates NONE DETECTED NONE DETECTED   Cocaine POSITIVE (A) NONE DETECTED   Benzodiazepines NONE DETECTED NONE DETECTED   Amphetamines POSITIVE (A) NONE DETECTED   Tetrahydrocannabinol POSITIVE (A) NONE DETECTED   Barbiturates NONE DETECTED NONE DETECTED  Troponin I (High Sensitivity)     Status: None   Collection Time: 12/28/22  3:41 AM  Result Value Ref Range   Troponin I (High Sensitivity) 7 <18 ng/L    ASSESSMENT: Jonathan Ibarra is a 57 y.o. male s/p ORIF of right tibial plateau fracture with repair of right lateral meniscus tear on 12/13/2022   PLAN: Weightbearing: NWB RLE ROM: Okay for range of motion of the knee as tolerated Incisional and dressing care: Change dressing to the right knee as needed Showering: Hold off on showering for now Orthopedic device(s): None  Pain management:  1. Tylenol 325 mg q 6 hours PRN 2. Robaxin 750 mg q 6 hours PRN 3. Oxycodone 5-10 mg q 4 hours PRN VTE prophylaxis:  Hold chemical prophylaxis in anticipation of surgery.  SCDs ID: Doxycycline 100 mg twice daily Foley/Lines:  No foley, KVO IVFs Impediments to Fracture Healing: Infection.  Vitamin D level pending, will start supplementation as indicated Dispo: Patient with continued purulent drainage from right knee.  Patient will benefit from formal I&D.  Will plan to proceed with this tomorrow 12/29/2022.  Please keep patient n.p.o. after midnight.  Will leave sutures in place to the right lower extremity, we will plan to remove these in the operating room tomorrow.   Follow - up plan:  TBD   Contact information:  Truitt Merle MD, Thyra Breed PA-C. After hours and holidays please check [email or web address] for group call information for [facility]   Thompson Caul, PA-C [phone] (office) [email or web address]

## 2022-12-28 NOTE — H&P (Addendum)
History and Physical    Patient: Jonathan Ibarra ION:[id number] DOB: Feb 09, 1966 DOA: 12/27/2022 DOS: the patient was seen and examined on 12/28/2022 PCP: Candi Leash, MD  Patient coming from: Home  Chief Complaint:  Chief Complaint  Patient presents with   Knee Pain   Fall   HPI: Jonathan Ibarra is a 57 y.o. male with medical history significant of hepatic cirrhosis, hepatic encephalopathy, thrombocytopenia, essential hypertension, polysubstance abuse, and recent admission 12/12/2022 post trauma after falling 6 feet down a river bed requiring ORIF of right knee. He presents to [facility] ED with severe right knee pain after a fall last night. He describes the pain as constant, severe 12/10, and aching. He reports not having supplies to clean the surgical site or change dressings at home and tan/yellow purulent discharge coming from site over the last week. He also reports having significant pain at the site since discharge after oxycodone was decreased to 5 mg from 5-10mg .   ED Course: On arrival to the hospital ED afebrile temp 36.9C, BP 119/97, HR 104, RR 18, SPO2 100% on RA.  Labs notable for sodium 132, albumin 2.3, elevated total bilirubin at 2.2 with normal AST/ALT no elevation in alk phos. Albumin 2.3. PLTs 401, UDS positive for methamphetamine, cocaine, THC.  Troponin negative x 2.  Plain film of the R knee obtained and did not show new fracture or alignment issue, did show moderate knee joint effusion.  HR later increased to 140s with palpitations, he was noted to be in Atrial Fibrillation with Rapid Ventricular Response on telemetry, and was started on Cardizem.  Blood cultures drawn and in process.  Chest x-ray obtained and negative.  EDP consulted Dr Jena Gauss with Ortho who recommended holding anticoagulation to rule out hemarthrosis of (R) knee and starting doxycycline. TRH contacted for admission.  Review of Systems: As mentioned in the history of present illness. All other  systems reviewed and are negative. Past Medical History:  Diagnosis Date   Alcohol abuse    Cirrhosis of liver (HCC)    Hypertension    Past Surgical History:  Procedure Laterality Date   CHOLECYSTECTOMY     IR TIPS     ORIF TIBIA PLATEAU Right 12/13/2022   Procedure: OPEN REDUCTION INTERNAL FIXATION (ORIF) TIBIAL PLATEAU;  Surgeon: Roby Lofts, MD;  Location: [facility] OR;  Service: Orthopedics;  Laterality: Right;   Social History:  reports that he has been smoking cigarettes. He has never used smokeless tobacco. He reports current drug use. Drugs: Cocaine and Marijuana. He reports that he does not drink alcohol.  Allergies  Allergen Reactions   Tylenol [Acetaminophen] Other (See Comments)    "Not good for liver"    No family history on file.  Prior to Admission medications   Medication Sig Start Date End Date Taking? Authorizing Provider  folic acid (FOLVITE) 1 MG tablet Take 1 tablet (1 mg total) by mouth daily. 12/18/22   Burnadette Pop, MD  furosemide (LASIX) 40 MG tablet Take 1 tablet (40 mg total) by mouth daily. 12/17/22   Burnadette Pop, MD  lactulose (CHRONULAC) 10 GM/15ML solution Take 30 mLs (20 g total) by mouth 2 (two) times daily. 12/17/22   Burnadette Pop, MD  methocarbamol (ROBAXIN) 750 MG tablet Take 1 tablet (750 mg total) by mouth every 8 (eight) hours as needed for muscle spasms. 12/17/22   Burnadette Pop, MD  metoprolol tartrate (LOPRESSOR) 25 MG tablet Take 1 tablet (25 mg total) by mouth 2 (two) times  daily. 12/17/22   Burnadette Pop, MD  oxyCODONE (OXY IR/ROXICODONE) 5 MG immediate release tablet Take 1 tablet (5 mg total) by mouth every 4 (four) hours as needed for moderate pain (pain score 4-6). 12/17/22   Burnadette Pop, MD  polyethylene glycol powder (SB POLYETHYLENE GLYCOL 3350) 17 GM/SCOOP powder Take 1 capful (17 g) by mouth daily. 12/17/22   Burnadette Pop, MD  spironolactone (ALDACTONE) 25 MG tablet Take 1 tablet (25 mg total) by mouth daily.  12/17/22   Burnadette Pop, MD  thiamine (VITAMIN B1) 100 MG tablet Take 1 tablet (100 mg total) by mouth daily. 12/18/22   Burnadette Pop, MD    Physical Exam: Vitals:   12/28/22 0636 12/28/22 0645 12/28/22 0700 12/28/22 0715  BP: (!) 123/92 139/83 (!) 140/90 (!) 105/58  Pulse: 98 (!) 111 96 (!) 106  Resp: 18 18 (!) 9 16  Temp: 98 F (36.7 C)     TempSrc: Oral     SpO2: 97% 97% 97% 98%    Constitutional: NAD, calm, comfortable Eyes: PERRL, 2+ pupils bilaterally, lids and conjunctivae normal ENMT: Mucous membranes are moist. Posterior pharynx clear of any exudate or lesions. Poor dentition, significant decay.  Neck: normal, supple, no masses, no thyromegaly Respiratory: clear to auscultation bilaterally, no wheezing, no crackles. Normal respiratory effort. No accessory muscle use.  Cardiovascular: Increased rate and irregular rhythm, no murmurs / rubs / gallops. No extremity edema. 2+ pedal pulses. No carotid bruits.  Abdomen: no tenderness, no masses palpated. No hepatosplenomegaly. Bowel sounds positive.  Musculoskeletal: no clubbing / cyanosis. No joint deformity upper and lower extremities. Good ROM, no contractures. Normal muscle tone.  Skin: Stapled surgical incision that is erythematous, edematous, with yellow/tan purulent drainage and some serosanguinous drainage noted on overlying gauze Neurologic: CN 2-12 grossly intact. Sensation intact, DTR normal. Strength 5/5 x all 4 extremities.  Psychiatric: Normal judgment and insight. Alert and oriented x 3. Normal mood.   Data Reviewed: CBC    Component Value Date/Time   WBC 10.2 12/27/2022 1657   RBC 4.40 12/27/2022 1657   HGB 13.3 12/27/2022 1657   HCT 40.7 12/27/2022 1657   PLT 401 (H) 12/27/2022 1657   MCV 92.5 12/27/2022 1657   MCH 30.2 12/27/2022 1657   MCHC 32.7 12/27/2022 1657   RDW 14.3 12/27/2022 1657   LYMPHSABS 1.2 12/27/2022 1657   MONOABS 1.1 (H) 12/27/2022 1657   EOSABS 0.2 12/27/2022 1657   BASOSABS 0.1  12/27/2022 1657   CMP     Component Value Date/Time   NA 132 (L) 12/27/2022 1647   K 3.8 12/27/2022 1647   CL 103 12/27/2022 1647   CO2 25 12/27/2022 1647   GLUCOSE 109 (H) 12/27/2022 1647   BUN 7 12/27/2022 1647   CREATININE 0.75 12/27/2022 1647   CALCIUM 8.6 (L) 12/27/2022 1647   PROT 7.2 12/27/2022 1647   ALBUMIN 2.3 (L) 12/27/2022 1647   AST 23 12/27/2022 1647   ALT 14 12/27/2022 1647   ALKPHOS 114 12/27/2022 1647   BILITOT 2.2 (H) 12/27/2022 1647   GFRNONAA >60 12/27/2022 1647   Cardiac Panel (last 3 results) Recent Labs    12/28/22 0129 12/28/22 0341  TROPONINIHS 7 7   Drugs of Abuse     Component Value Date/Time   LABOPIA NONE DETECTED 12/28/2022 0303   COCAINSCRNUR POSITIVE (A) 12/28/2022 0303   LABBENZ NONE DETECTED 12/28/2022 0303   AMPHETMU POSITIVE (A) 12/28/2022 0303   THCU POSITIVE (A) 12/28/2022 0303   LABBARB NONE  DETECTED 12/28/2022 0303    Results for orders placed or performed during the hospital encounter of 12/12/22  Surgical pcr screen     Status: Abnormal   Collection Time: 12/13/22  7:51 AM   Specimen: Nasal Mucosa; Nasal Swab  Result Value Ref Range Status   MRSA, PCR POSITIVE (A) NEGATIVE Final   Staphylococcus aureus POSITIVE (A) NEGATIVE Final    Comment: RESULT CALLED TO, READ BACK BY AND VERIFIED WITH: RN SIERA.C AT 1406 ON 12/13/2022 BY T.SAAD. (NOTE) The Xpert SA Assay (FDA approved for NASAL specimens in patients 20 years of age and older), is one component of a comprehensive surveillance program. It is not intended to diagnose infection nor to guide or monitor treatment. Performed at [facility], [address] [place], [place] [address]     DG Chest Portable 1 View  Result Date: 12/28/2022 CLINICAL DATA:  Pain EXAM: PORTABLE CHEST 1 VIEW COMPARISON:  Radiograph 12/12/2022 FINDINGS: Stable enlargement of the cardiomediastinal silhouette. Pulmonary vascular congestion. No focal consolidation, pleural effusion, or  pneumothorax. Subacute anterior left sixth rib fracture. IMPRESSION: No active disease. Electronically Signed   By: Minerva Fester M.D.   On: 12/28/2022 03:36   DG Knee Complete 4 Views Right  Result Date: 12/27/2022 CLINICAL DATA:  6 foot fall 2 weeks ago injuring right leg that he had surgery on 2 weeks ago. Unable to pick up antibiotic prescription. Drainage coming from postop site. Another fall 2 days ago. EXAM: RIGHT KNEE - COMPLETE 4+ VIEW COMPARISON:  Radiograph 12/13/2022 FINDINGS: Lateral plate and screw fixation of the proximal tibial metadiaphysis. No radiographic evidence of loosening. Fracture lines remain visible. Unchanged alignment. No evidence of acute fracture. Moderate knee joint effusion. Soft tissue swelling about the knee. IMPRESSION: 1. Unchanged lateral plate and screw fixation across the comminuted fracture involving the medial and lateral tibial plateaus. No change in alignment or evidence of new fracture. 2. Moderate knee joint effusion. Electronically Signed   By: Minerva Fester M.D.   On: 12/27/2022 20:25   VAS [place] LOWER EXTREMITY VENOUS (DVT) (ONLY [facility] & [facility])  Result Date: 12/27/2022  Lower Venous DVT Study Patient Name:  Jonathan Ibarra  Date of Exam:   12/27/2022 Medical Rec #: [id number]           Accession #:    [id number] Date of Birth: December 17, 1965          Patient Gender: M Patient Age:   26 years Exam Location:  [facility] Procedure:      VAS [place] LOWER EXTREMITY VENOUS (DVT) Referring Phys: Alvira Monday --------------------------------------------------------------------------------  Indications: Pain.  Risk Factors: Surgery Trauma. Limitations: Body habitus and poor ultrasound/tissue interface. Comparison Study: No prior studies. Performing Technologist: Chanda Busing RVT  Examination Guidelines: A complete evaluation includes B-mode imaging, spectral Doppler, color Doppler, and power Doppler as needed of all accessible portions of each vessel. Bilateral  testing is considered an integral part of a complete examination. Limited examinations for reoccurring indications may be performed as noted. The reflux portion of the exam is performed with the patient in reverse Trendelenburg.  +---------+---------------+---------+-----------+----------+-------------------+ RIGHT    CompressibilityPhasicitySpontaneityPropertiesThrombus Aging      +---------+---------------+---------+-----------+----------+-------------------+ CFV      Full           Yes      Yes                                      +---------+---------------+---------+-----------+----------+-------------------+  SFJ      Full                                                             +---------+---------------+---------+-----------+----------+-------------------+ FV Prox  Full                                                             +---------+---------------+---------+-----------+----------+-------------------+ FV Mid   Full                                                             +---------+---------------+---------+-----------+----------+-------------------+ FV Distal               Yes      Yes                                      +---------+---------------+---------+-----------+----------+-------------------+ PFV      Full                                                             +---------+---------------+---------+-----------+----------+-------------------+ POP      Full           Yes      Yes                                      +---------+---------------+---------+-----------+----------+-------------------+ PTV      Full                                                             +---------+---------------+---------+-----------+----------+-------------------+ PERO                                                  Not well visualized +---------+---------------+---------+-----------+----------+-------------------+    +----+---------------+---------+-----------+----------+--------------+ LEFTCompressibilityPhasicitySpontaneityPropertiesThrombus Aging +----+---------------+---------+-----------+----------+--------------+ CFV Full           Yes      Yes                                 +----+---------------+---------+-----------+----------+--------------+    Summary: RIGHT: - There is no evidence of deep vein thrombosis in the lower extremity. However, portions of this examination were limited- see technologist comments above.  -  No cystic structure found in the popliteal fossa.  LEFT: - No evidence of common femoral vein obstruction.   *See table(s) above for measurements and observations.    Preliminary        Assessment and Plan: #Septic Arthritis of (R) Knee  Tan/yellow purulent discharge from (R) Knee. WBC on high end of normal at 10.1. - Doxycycline IV - PRN Analgesia (Reduced dose Tylenol, Robaxin, Oxycodone) - Ortho consulted by EDP appreciate their recommendations and management  #Atrial Fibrillation with Rapid Ventricular Response In setting of UDS (+) for cocaine and methamphetamine and localized infection. Not on home anticoagulation. Per EDP note Ortho recommends holding off on anticoagulation until hemarthosis can be ruled out. TSH 1.222 in 09/2022. - Continue IV Cardizem - Resume home metoprolol for rate control    Polysubstance abuse  UDS positive for methamphetamine, THC, and cocaine. Patient reports he has not used ETOH in years. - Counseled for cessation - q6H CIWA assessment - Patient declined NRT while inpatient   #Liver cirrhosis  Status post TIPS. Normal liver enzymes. Has history of hepatitis C - Continue home lactulose, lasix, spironolactone    #Hypertension  -Continue home spironolactone, metoprolol, Lasix    #Seizure disorder Not on AEDs  VTE prophylaxis: SCDs, Pharm VTE held pending Ortho evaluation  GI prophylaxis: Not indicated  Diet: NPO pending Ortho  evaluation Access: PIV Lines: NONE Code Status: FULL Telemetry: Yes, on Cardizem Disposition: Admit to Progressive  Advance Care Planning:   Code Status: Full Code   Consults: Ortho called by EDP  Family Communication: None at bedside  Severity of Illness: The appropriate patient status for this patient is INPATIENT. Inpatient status is judged to be reasonable and necessary in order to provide the required intensity of service to ensure the patient's safety. The patient's presenting symptoms, physical exam findings, and initial radiographic and laboratory data in the context of their chronic comorbidities is felt to place them at high risk for further clinical deterioration. Furthermore, it is not anticipated that the patient will be medically stable for discharge from the hospital within 2 midnights of admission.   * I certify that at the point of admission it is my clinical judgment that the patient will require inpatient hospital care spanning beyond 2 midnights from the point of admission due to high intensity of service, high risk for further deterioration and high frequency of surveillance required.*  To reach the provider On-Call:   7AM- 7PM see care teams to locate the attending and reach out to them via [email or web address] Password: [email or web address] 7PM-7AM contact night-coverage If you still have difficulty reaching the appropriate provider, please page the [facility] (Director on Call) for [facility] on amion for assistance  This document was prepared using Conservation officer, historic buildings and may include unintentional dictation errors.  Bishop Limbo FNP-BC, PMHNP-BC Nurse Practitioner [facility]

## 2022-12-28 NOTE — Progress Notes (Signed)
Pharmacy Antibiotic Note  Jonathan Ibarra is a 57 y.o. male admitted on 12/27/2022 presenting with knee pain and wound drainage.  Pharmacy has been consulted for vancomycin and zosyn dosing.  Vancomycin 2g IV x 1 given in ED  Plan: Vancomycin 1g IV q 8h (eAUC 493) Zosyn 3.375g IV q 8h (extended 4h infusion) Monitor renal function, Cx, clinical progression and ortho recs Vancomycin levels as indicated     Temp (24hrs), Avg:98 F (36.7 C), Min:97.4 F (36.3 C), Max:98.5 F (36.9 C)  Recent Labs  Lab 12/27/22 1647 12/27/22 1657  WBC  --  10.2  CREATININE 0.75  --     Estimated Creatinine Clearance: 140 mL/min (by C-G formula based on SCr of 0.75 mg/dL).    Allergies  Allergen Reactions   Tylenol [Acetaminophen] Other (See Comments)    Impacts liver    [name], PharmD, [facility] Clinical Pharmacist ED Pharmacist Phone # [phone] 12/28/2022 11:59 AM

## 2022-12-28 NOTE — ED Notes (Signed)
Received pt at 11, Cardizem infusing 17ml/hr

## 2022-12-28 NOTE — Progress Notes (Signed)
Dressing changed to RLE moderate leakage tan on previous dressing noted.

## 2022-12-28 NOTE — ED Provider Notes (Addendum)
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 PFV      Full                                                             +---------+---------------+---------+-----------+----------+-------------------+ POP      Full           Yes      Yes                                      +---------+---------------+---------+-----------+----------+-------------------+ PTV      Full                                                             +---------+---------------+---------+-----------+----------+-------------------+ PERO                                                  Not well visualized +---------+---------------+---------+-----------+----------+-------------------+   +----+---------------+---------+-----------+----------+--------------+ LEFTCompressibilityPhasicitySpontaneityPropertiesThrombus Aging +----+---------------+---------+-----------+----------+--------------+ CFV  Full           Yes      Yes                                 +----+---------------+---------+-----------+----------+--------------+  Summary: RIGHT: - There is no evidence of deep vein thrombosis in the lower extremity. However, portions of this examination were limited- see technologist comments above.  - No cystic structure found in the popliteal fossa.  LEFT: - No evidence of common femoral vein obstruction.   *See table(s) above for measurements and observations.    Preliminary     Procedures .Critical Care E&M  Performed by: Cy Blamer, MD Critical care provider statement:    Critical care end time:  12/28/2022 3:06 AM   Critical care was necessary to treat or prevent imminent or life-threatening deterioration of the following conditions:  Circulatory failure   Critical care was time spent personally by me on the following activities:  Ordering and performing treatments and interventions, ordering and review of laboratory studies, ordering and review of radiographic studies, pulse oximetry, re-evaluation of patient's condition, review of old charts and discussions with consultants After initial E/M assessment, critical care services were subsequently performed that were exclusive of separately billable procedures or treatment.   Comments:     Diltiazem AFIB, case d/w Dr. Mariana Arn     Medications Ordered in ED Medications  diltiazem (CARDIZEM) 1 mg/mL load via infusion 10 mg (10 mg Intravenous Bolus from Bag 12/28/22 0106)    And  diltiazem (CARDIZEM) 125 mg in dextrose 5% 125 mL (1 mg/mL) infusion (15 mg/hr Intravenous Rate/Dose Change 12/28/22 0223)  vancomycin (VANCOREADY) IVPB 2000 mg/400 mL (2,000 mg Intravenous New Bag/Given 12/28/22 0140)  oxyCODONE (Oxy IR/ROXICODONE) immediate release tablet 5 mg (5 mg Oral Given 12/27/22 1659)  doxycycline (VIBRA-TABS) tablet 100 mg (100 mg Oral Given 12/28/22 0054)  piperacillin-tazobactam (ZOSYN) IVPB 3.375 g (0 g Intravenous Stopped  12/28/22 0142)    ED Course/ Medical Decision Making/ A&P                                 Medical Decision Making Amount and/or Complexity of Data Reviewed Labs: ordered.    Details: Troponin normal 7, sodium slight low 132, potassium normal 3.8, normal creatinine 0.75, normal LFTS.  Normal white count 10.2, normal hemoglobin 13.3, normal platelet count blood cultures sent  Radiology: ordered and independent interpretation performed.    Details: No PNA by me on CXR ECG/medicine tests: ordered and independent interpretation performed. Decision-making details documented in ED Course. Discussion of management or test interpretation with external provider(s): Case d/w Dr. Jena Gauss who will see the patient.  Please hold on anticoagulation for now  Risk Prescription drug management. Decision regarding hospitalization.  Critical Care Total time providing critical care: 90 minutes   r why not admission, treatments were needed:1} Final Clinical Impression(s) / ED Diagnoses Final diagnoses:  Knee effusion, right  Fall, initial encounter   The patient appears reasonably stabilized for admission considering the current resources, flow, and capabilities available in the ED at this time, and I doubt any other [facility] requiring further screening and/or treatment in the ED prior to admission.  Rx / DC Orders ED Discharge Orders     None             Evrett Hakim, MD 12/28/22 1610

## 2022-12-28 NOTE — ED Notes (Signed)
Patient refused In and out catheter, 1 cup of water given to drink .

## 2022-12-28 NOTE — H&P (View-Only) (Signed)
Orthopaedic Trauma Progress Note  SUBJECTIVE: Patient presented to the emergency department yesterday 12/27/2022 for right knee pain and continued wound drainage following a fall.  Patient underwent ORIF of right tibial plateau fracture by Dr. Jena Gauss on 12/13/2022.  Notes that he has had drainage since surgery.  Initially plan for patient to be started on antibiotics and discharged home with outpatient follow-up with orthopedics today but unfortunately patient went into A-fib with RVR while in the emergency department.  Was admitted to the medical service.  Orthopedics asked to evaluate patient's right knee due to concern for infection.  Patient seen this morning in the emergency department.  Notes that he has had drainage since initial surgery.  He reports not having supplies to clean the surgical site or change dressings at home and notes an increased amount of tan/yellow purulent discharge coming from site over the last week. He also reports having significant pain at the surgical site since discharge because oxycodone was decreased to 5 mg when he was previously on 10 mg in the hospital.  Denies any fever or chills.  Denies chest pain. No SOB. No nausea/vomiting. No other complaints.   OBJECTIVE:  Vitals:   12/28/22 0715 12/28/22 1039  BP: (!) 105/58 (!) 100/55  Pulse: (!) 106 (!) 59  Resp: 16 16  Temp:  (!) 97.5 F (36.4 C)  SpO2: 98% 94%    General: Resting in bed, no acute distress Respiratory: No increased work of breathing.  Right lower extremity:  + Joint effusion.  Sutures in place over knee incision are stable.  Incision with surrounding redness.  Distal portion of the incision with purulent drainage.  Remainder of incision appears stable.  Pain with motion of the knee.  Ankle dorsiflexion/plantarflexion is intact but patient had some discomfort with this.  No significant increase in pain with passive stretch of the toes.  Global soreness/tenderness to the calf but no areas of  significant increase in pain.  + EHL/FHL intact.  Neurovascularly intact.  IMAGING: Repeat imaging right knee performed 12/27/2022 shows stable appearance to plate and screw fixation.  Fracture appears stable.  No change in alignment.  No evidence of new fracture.  Patient with moderate knee joint effusion.  LABS:  Results for orders placed or performed during the hospital encounter of 12/27/22 (from the past 24 hour(s))  Comprehensive metabolic panel     Status: Abnormal   Collection Time: 12/27/22  4:47 PM  Result Value Ref Range   Sodium 132 (L) 135 - 145 mmol/L   Potassium 3.8 3.5 - 5.1 mmol/L   Chloride 103 98 - 111 mmol/L   CO2 25 22 - 32 mmol/L   Glucose, Bld 109 (H) 70 - 99 mg/dL   BUN 7 6 - 20 mg/dL   Creatinine, Ser 1.61 0.61 - 1.24 mg/dL   Calcium 8.6 (L) 8.9 - 10.3 mg/dL   Total Protein 7.2 6.5 - 8.1 g/dL   Albumin 2.3 (L) 3.5 - 5.0 g/dL   AST 23 15 - 41 U/L   ALT 14 0 - 44 U/L   Alkaline Phosphatase 114 38 - 126 U/L   Total Bilirubin 2.2 (H) 0.3 - 1.2 mg/dL   GFR, Estimated >09 >60 mL/min   Anion gap 4 (L) 5 - 15  CBC with Differential/Platelet     Status: Abnormal   Collection Time: 12/27/22  4:57 PM  Result Value Ref Range   WBC 10.2 4.0 - 10.5 K/uL   RBC 4.40 4.22 - 5.81 MIL/uL  Hemoglobin 13.3 13.0 - 17.0 g/dL   HCT 09.8 11.9 - 14.7 %   MCV 92.5 80.0 - 100.0 fL   MCH 30.2 26.0 - 34.0 pg   MCHC 32.7 30.0 - 36.0 g/dL   RDW 82.9 56.2 - 13.0 %   Platelets 401 (H) 150 - 400 K/uL   nRBC 0.0 0.0 - 0.2 %   Neutrophils Relative % 74 %   Neutro Abs 7.5 1.7 - 7.7 K/uL   Lymphocytes Relative 12 %   Lymphs Abs 1.2 0.7 - 4.0 K/uL   Monocytes Relative 11 %   Monocytes Absolute 1.1 (H) 0.1 - 1.0 K/uL   Eosinophils Relative 2 %   Eosinophils Absolute 0.2 0.0 - 0.5 K/uL   Basophils Relative 1 %   Basophils Absolute 0.1 0.0 - 0.1 K/uL   Immature Granulocytes 0 %   Abs Immature Granulocytes 0.03 0.00 - 0.07 K/uL  Troponin I (High Sensitivity)     Status: None    Collection Time: 12/28/22  1:29 AM  Result Value Ref Range   Troponin I (High Sensitivity) 7 <18 ng/L  Rapid urine drug screen (hospital performed)     Status: Abnormal   Collection Time: 12/28/22  3:03 AM  Result Value Ref Range   Opiates NONE DETECTED NONE DETECTED   Cocaine POSITIVE (A) NONE DETECTED   Benzodiazepines NONE DETECTED NONE DETECTED   Amphetamines POSITIVE (A) NONE DETECTED   Tetrahydrocannabinol POSITIVE (A) NONE DETECTED   Barbiturates NONE DETECTED NONE DETECTED  Troponin I (High Sensitivity)     Status: None   Collection Time: 12/28/22  3:41 AM  Result Value Ref Range   Troponin I (High Sensitivity) 7 <18 ng/L    ASSESSMENT: Jonathan Ibarra is a 57 y.o. male s/p ORIF of right tibial plateau fracture with repair of right lateral meniscus tear on 12/13/2022   PLAN: Weightbearing: NWB RLE ROM: Okay for range of motion of the knee as tolerated Incisional and dressing care: Change dressing to the right knee as needed Showering: Hold off on showering for now Orthopedic device(s): None  Pain management:  1. Tylenol 325 mg q 6 hours PRN 2. Robaxin 750 mg q 6 hours PRN 3. Oxycodone 5-10 mg q 4 hours PRN VTE prophylaxis:  Hold chemical prophylaxis in anticipation of surgery.  SCDs ID: Doxycycline 100 mg twice daily Foley/Lines:  No foley, KVO IVFs Impediments to Fracture Healing: Infection.  Vitamin D level pending, will start supplementation as indicated Dispo: Patient with continued purulent drainage from right knee.  Patient will benefit from formal I&D.  Will plan to proceed with this tomorrow 12/29/2022.  Please keep patient n.p.o. after midnight.  Will leave sutures in place to the right lower extremity, we will plan to remove these in the operating room tomorrow.   Follow - up plan:  TBD   Contact information:  Truitt Merle MD, Thyra Breed PA-C. After hours and holidays please check [email or web address] for group call information for [facility]   Thompson Caul, PA-C [phone] (office) [email or web address]

## 2022-12-29 ENCOUNTER — Encounter (HOSPITAL_COMMUNITY)
Admission: EM | Disposition: A | Discharge status: Home Health | Payer: Self-pay | Source: Home / Self Care | Attending: Internal Medicine

## 2022-12-29 ENCOUNTER — Inpatient Hospital Stay (HOSPITAL_COMMUNITY): Payer: Commercial Managed Care - HMO

## 2022-12-29 ENCOUNTER — Other Ambulatory Visit: Payer: Self-pay

## 2022-12-29 ENCOUNTER — Encounter (HOSPITAL_COMMUNITY): Payer: Self-pay | Admitting: Internal Medicine

## 2022-12-29 DIAGNOSIS — I2510 Atherosclerotic heart disease of native coronary artery without angina pectoris: Secondary | ICD-10-CM | POA: Diagnosis not present

## 2022-12-29 DIAGNOSIS — T8141XA Infection following a procedure, superficial incisional surgical site, initial encounter: Secondary | ICD-10-CM | POA: Diagnosis not present

## 2022-12-29 DIAGNOSIS — F17210 Nicotine dependence, cigarettes, uncomplicated: Secondary | ICD-10-CM | POA: Diagnosis not present

## 2022-12-29 DIAGNOSIS — I480 Paroxysmal atrial fibrillation: Secondary | ICD-10-CM | POA: Diagnosis not present

## 2022-12-29 DIAGNOSIS — M009 Pyogenic arthritis, unspecified: Secondary | ICD-10-CM | POA: Diagnosis not present

## 2022-12-29 HISTORY — PX: IRRIGATION AND DEBRIDEMENT KNEE: SHX5185

## 2022-12-29 LAB — COMPREHENSIVE METABOLIC PANEL
ALT: 12 U/L (ref 0–44)
AST: 20 U/L (ref 15–41)
Albumin: 2 g/dL — ABNORMAL LOW (ref 3.5–5.0)
Alkaline Phosphatase: 103 U/L (ref 38–126)
Anion gap: 4 — ABNORMAL LOW (ref 5–15)
BUN: 14 mg/dL (ref 6–20)
CO2: 24 mmol/L (ref 22–32)
Calcium: 8.4 mg/dL — ABNORMAL LOW (ref 8.9–10.3)
Chloride: 102 mmol/L (ref 98–111)
Creatinine, Ser: 0.77 mg/dL (ref 0.61–1.24)
GFR, Estimated: >60 mL/min (ref >60)
Glucose, Bld: 102 mg/dL — ABNORMAL HIGH (ref 70–99)
Potassium: 4.2 mmol/L (ref 3.5–5.1)
Sodium: 130 mmol/L — ABNORMAL LOW (ref 135–145)
Total Bilirubin: 1.6 mg/dL — ABNORMAL HIGH (ref 0.3–1.2)
Total Protein: 6.2 g/dL — ABNORMAL LOW (ref 6.5–8.1)

## 2022-12-29 LAB — CBC
HCT: 34.7 % — ABNORMAL LOW (ref 39.0–52.0)
Hemoglobin: 11.7 g/dL — ABNORMAL LOW (ref 13.0–17.0)
MCH: 30.7 pg (ref 26.0–34.0)
MCHC: 33.7 g/dL (ref 30.0–36.0)
MCV: 91.1 fL (ref 80.0–100.0)
Platelets: 353 10*3/uL (ref 150–400)
RBC: 3.81 MIL/uL — ABNORMAL LOW (ref 4.22–5.81)
RDW: 14.6 % (ref 11.5–15.5)
WBC: 7.4 10*3/uL (ref 4.0–10.5)
nRBC: 0 % (ref 0.0–0.2)

## 2022-12-29 SURGERY — IRRIGATION AND DEBRIDEMENT KNEE
Anesthesia: General | Site: Knee | Laterality: Right

## 2022-12-29 MED ORDER — CHLORHEXIDINE GLUCONATE 0.12 % MT SOLN
15.0000 mL | Freq: Once | OROMUCOSAL | Status: AC
Start: 2022-12-29 — End: 2022-12-29

## 2022-12-29 MED ORDER — MIDAZOLAM HCL 2 MG/2ML IJ SOLN
INTRAMUSCULAR | Status: DC | PRN
  Administered 2022-12-29: 2 mg via INTRAVENOUS

## 2022-12-29 MED ORDER — DEXAMETHASONE SODIUM PHOSPHATE 10 MG/ML IJ SOLN
INTRAMUSCULAR | Status: DC | PRN
  Administered 2022-12-29: 4 mg via INTRAVENOUS

## 2022-12-29 MED ORDER — PROPOFOL 10 MG/ML IV BOLUS
INTRAVENOUS | Status: DC | PRN
  Administered 2022-12-29: 150 mg via INTRAVENOUS

## 2022-12-29 MED ORDER — ROCURONIUM BROMIDE 10 MG/ML (PF) SYRINGE
PREFILLED_SYRINGE | INTRAVENOUS | Status: DC | PRN
  Administered 2022-12-29: 100 mg via INTRAVENOUS

## 2022-12-29 MED ORDER — HYDROMORPHONE HCL 1 MG/ML IJ SOLN
INTRAMUSCULAR | Status: AC
  Filled 2022-12-29: qty 0.5

## 2022-12-29 MED ORDER — HYDROMORPHONE HCL 1 MG/ML IJ SOLN
INTRAMUSCULAR | Status: AC
  Filled 2022-12-29: qty 1

## 2022-12-29 MED ORDER — VANCOMYCIN HCL 1000 MG IV SOLR
INTRAVENOUS | Status: DC | PRN
  Administered 2022-12-29: 1000 mg via TOPICAL

## 2022-12-29 MED ORDER — SODIUM CHLORIDE 0.9 % IV SOLN: INTRAVENOUS | Status: DC | PRN

## 2022-12-29 MED ORDER — CHLORHEXIDINE GLUCONATE 0.12 % MT SOLN
OROMUCOSAL | Status: AC
  Administered 2022-12-29: 15 mL via OROMUCOSAL
  Filled 2022-12-29: qty 15

## 2022-12-29 MED ORDER — ONDANSETRON HCL 4 MG/2ML IJ SOLN
INTRAMUSCULAR | Status: DC | PRN
  Administered 2022-12-29: 4 mg via INTRAVENOUS

## 2022-12-29 MED ORDER — HYDROMORPHONE HCL 1 MG/ML IJ SOLN
0.2500 mg | INTRAMUSCULAR | Status: DC | PRN
  Administered 2022-12-29 (×3): 0.5 mg via INTRAVENOUS

## 2022-12-29 MED ORDER — PROPOFOL 10 MG/ML IV BOLUS
INTRAVENOUS | Status: AC
  Filled 2022-12-29: qty 20

## 2022-12-29 MED ORDER — TOBRAMYCIN SULFATE 1.2 G IJ SOLR
INTRAMUSCULAR | Status: DC | PRN
  Administered 2022-12-29: 1.2 g via TOPICAL

## 2022-12-29 MED ORDER — ENOXAPARIN SODIUM 40 MG/0.4ML IJ SOSY
40.0000 mg | PREFILLED_SYRINGE | INTRAMUSCULAR | Status: DC
  Administered 2022-12-30 – 2023-01-23 (×24): 40 mg via SUBCUTANEOUS
  Filled 2022-12-29 (×25): qty 0.4

## 2022-12-29 MED ORDER — ORAL CARE MOUTH RINSE: 15.0000 mL | Freq: Once | OROMUCOSAL | Status: AC

## 2022-12-29 MED ORDER — SUGAMMADEX SODIUM 200 MG/2ML IV SOLN
INTRAVENOUS | Status: DC | PRN
  Administered 2022-12-29: 200 mg via INTRAVENOUS

## 2022-12-29 MED ORDER — 0.9 % SODIUM CHLORIDE (POUR BTL) OPTIME
TOPICAL | Status: DC | PRN
  Administered 2022-12-29: 1000 mL

## 2022-12-29 MED ORDER — BACITRACIN ZINC 500 UNIT/GM EX OINT
TOPICAL_OINTMENT | CUTANEOUS | Status: AC
  Filled 2022-12-29: qty 28.35

## 2022-12-29 MED ORDER — FENTANYL CITRATE (PF) 250 MCG/5ML IJ SOLN
INTRAMUSCULAR | Status: AC
  Filled 2022-12-29: qty 5

## 2022-12-29 MED ORDER — VANCOMYCIN HCL 1000 MG IV SOLR
INTRAVENOUS | Status: AC
  Filled 2022-12-29: qty 20

## 2022-12-29 MED ORDER — FENTANYL CITRATE (PF) 250 MCG/5ML IJ SOLN
INTRAMUSCULAR | Status: DC | PRN
  Administered 2022-12-29: 100 ug via INTRAVENOUS
  Administered 2022-12-29: 50 ug via INTRAVENOUS
  Administered 2022-12-29: 100 ug via INTRAVENOUS

## 2022-12-29 MED ORDER — MIDAZOLAM HCL 2 MG/2ML IJ SOLN
INTRAMUSCULAR | Status: AC
Start: 2022-12-29 — End: ?
  Filled 2022-12-29: qty 2

## 2022-12-29 MED ORDER — TOBRAMYCIN SULFATE 1.2 G IJ SOLR
INTRAMUSCULAR | Status: AC
  Filled 2022-12-29: qty 1.2

## 2022-12-29 MED ORDER — HYDROMORPHONE HCL 1 MG/ML IJ SOLN
INTRAMUSCULAR | Status: DC | PRN
  Administered 2022-12-29: .5 mg via INTRAVENOUS

## 2022-12-29 SURGICAL SUPPLY — 48 items
APL PRP STRL LF DISP 70% ISPRP (MISCELLANEOUS) ×1
BAG COUNTER SPONGE SURGICOUNT (BAG) ×1 IMPLANT
BAG SPNG CNTER NS LX DISP (BAG) ×1
BNDG COHESIVE 4X5 TAN STRL (GAUZE/BANDAGES/DRESSINGS) ×1 IMPLANT
BNDG GAUZE DERMACEA FLUFF 4 (GAUZE/BANDAGES/DRESSINGS) ×2 IMPLANT
BNDG GZE DERMACEA 4 6PLY (GAUZE/BANDAGES/DRESSINGS) ×2
BRUSH SCRUB EZ PLAIN DRY (MISCELLANEOUS) ×2 IMPLANT
CANISTER WOUND CARE 500ML ATS (WOUND CARE) IMPLANT
CHLORAPREP W/TINT 26 (MISCELLANEOUS) ×1 IMPLANT
COVER MAYO STAND STRL (DRAPES) ×1 IMPLANT
COVER SURGICAL LIGHT HANDLE (MISCELLANEOUS) ×2 IMPLANT
DRAPE SURG 17X23 STRL (DRAPES) ×1 IMPLANT
DRAPE SURG ORHT 6 SPLT 77X108 (DRAPES) ×1 IMPLANT
DRAPE U-SHAPE 47X51 STRL (DRAPES) ×1 IMPLANT
DRESSING PEEL AND PLC PRVNA 13 (GAUZE/BANDAGES/DRESSINGS) IMPLANT
DRSG ADAPTIC 3X8 NADH LF (GAUZE/BANDAGES/DRESSINGS) ×1 IMPLANT
DRSG PEEL AND PLACE PREVENA 13 (GAUZE/BANDAGES/DRESSINGS) ×1
ELECT REM PT RETURN 9FT ADLT (ELECTROSURGICAL)
ELECTRODE REM PT RTRN 9FT ADLT (ELECTROSURGICAL) IMPLANT
EVACUATOR 1/8 PVC DRAIN (DRAIN) IMPLANT
GAUZE SPONGE 4X4 12PLY STRL (GAUZE/BANDAGES/DRESSINGS) ×1 IMPLANT
GLOVE BIO SURGEON STRL SZ 6.5 (GLOVE) ×3 IMPLANT
GLOVE BIO SURGEON STRL SZ7.5 (GLOVE) ×4 IMPLANT
GLOVE BIOGEL PI IND STRL 6.5 (GLOVE) ×1 IMPLANT
GLOVE BIOGEL PI IND STRL 7.5 (GLOVE) ×1 IMPLANT
GOWN STRL REUS W/ TWL LRG LVL3 (GOWN DISPOSABLE) ×2 IMPLANT
GOWN STRL REUS W/TWL LRG LVL3 (GOWN DISPOSABLE) ×2
HANDPIECE INTERPULSE COAX TIP (DISPOSABLE)
KIT BASIN OR (CUSTOM PROCEDURE TRAY) ×1 IMPLANT
KIT TURNOVER KIT B (KITS) ×1 IMPLANT
MANIFOLD NEPTUNE II (INSTRUMENTS) ×1 IMPLANT
NS IRRIG 1000ML POUR BTL (IV SOLUTION) ×1 IMPLANT
PACK ORTHO EXTREMITY (CUSTOM PROCEDURE TRAY) ×1 IMPLANT
PAD ARMBOARD 7.5X6 YLW CONV (MISCELLANEOUS) ×2 IMPLANT
PADDING CAST COTTON 6X4 STRL (CAST SUPPLIES) ×1 IMPLANT
SET HNDPC FAN SPRY TIP SCT (DISPOSABLE) IMPLANT
SPONGE T-LAP 18X18 ~~LOC~~+RFID (SPONGE) ×1 IMPLANT
SUT ETHILON 2 0 FS 18 (SUTURE) ×2 IMPLANT
SUT ETHILON 3 0 PS 1 (SUTURE) ×2 IMPLANT
SUT MON AB 2-0 CT1 36 (SUTURE) ×1 IMPLANT
SUT PDS AB 0 CT 36 (SUTURE) IMPLANT
SWAB CULTURE ESWAB REG 1ML (MISCELLANEOUS) IMPLANT
TOWEL GREEN STERILE (TOWEL DISPOSABLE) ×2 IMPLANT
TOWEL GREEN STERILE FF (TOWEL DISPOSABLE) ×1 IMPLANT
TUBE CONNECTING 12X1/4 (SUCTIONS) ×1 IMPLANT
UNDERPAD 30X36 HEAVY ABSORB (UNDERPADS AND DIAPERS) ×1 IMPLANT
WATER STERILE IRR 1000ML POUR (IV SOLUTION) ×1 IMPLANT
YANKAUER SUCT BULB TIP NO VENT (SUCTIONS) ×1 IMPLANT

## 2022-12-29 NOTE — Evaluation (Signed)
Physical Therapy Evaluation Patient Details Name: Jonathan Ibarra MRN: [id number] DOB: Jul 10, 1965 Today's Date: 12/29/2022  History of Present Illness  Pt is a 57 y/o male presenting 10/28 to [facility] with severe right knee pain and drainage after a fall the night PTA, he is now s/p R knee I&D on 12/29/22. He recently had a R tibial ORIF on 12/13/22 after a falls incidence. NWG:NFAOZHY abuse, [name], HTN, liver cirrhosis, polysubstance abuse.  Clinical Impression  Pt is presenting below baseline level of functioning; most likely with decreased pain close to previous discharge level of functioning. Currently due to 10/10 pain at the R knee pt declined out of bed activity. Pt was Min A for sit <>supine this session with HR up to 167 during activity. Pt encouraged to keep O2 on via nasal cannula; pt kept removing unconsciously. Due to pt prior level of functioning, home set up, available equipment at home currently recommending skilled physical therapy services 3x/week on discharge from acute care hospital setting pending pt pain management. Pt was limited by pain this session.       If plan is discharge home, recommend the following: A lot of help with walking and/or transfers;Assistance with cooking/housework;Assist for transportation;Help with stairs or ramp for entrance     Equipment Recommendations Other (comment) (pt has needed equipment from last hospitalization)     Functional Status Assessment Patient has had a recent decline in their functional status and demonstrates the ability to make significant improvements in function in a reasonable and predictable amount of time.     Precautions / Restrictions Precautions Precautions: Fall Restrictions Weight Bearing Restrictions: Yes RLE Weight Bearing: Non weight bearing Other Position/Activity Restrictions: no formal orders but was NWB last admit during 10/16 notes      Mobility  Bed Mobility Overal bed mobility: Needs Assistance Bed  Mobility: Supine to Sit, Sit to Supine     Supine to sit: Min assist, [name] Sit to supine: Min assist, [name]   General bed mobility comments: Min A for RLE management    Transfers     General transfer comment: deferred due to pain and HR briefly up to 167 bpm    Ambulation/Gait   General Gait Details: Pt declined at this time and HR up to 163 briefly with bed mobility       Balance Overall balance assessment: Needs assistance Sitting-balance support: No upper extremity supported, Feet supported Sitting balance-Leahy Scale: Fair         Standing balance comment: deferred         Pertinent Vitals/Pain Pain Assessment Pain Assessment: 0-10 Pain Score: 10-Worst pain ever Pain Location: RLE Pain Descriptors / Indicators: Discomfort, Grimacing, Crying, Moaning Pain Intervention(s): Monitored during session, Limited activity within patient's tolerance, Repositioned, Patient requesting pain meds-RN notified    Home Living Family/patient expects to be discharged to:: Private residence Living Arrangements: Non-relatives/Friends Available Help at Discharge: Available PRN/intermittently;Friend(s) Type of Home: House Home Access: Stairs to enter Entrance Stairs-Rails: Right Entrance Stairs-Number of Steps: 2   Home Layout: One level Home Equipment: Cane - single point;Rolling Walker (2 wheels);BSC/3in1;Wheelchair - manual Additional Comments: Reports 1 fall leading to admission, went to turn a light on and overextended. Pt did have another fall that led to last admission 1-2 weeks ago    Prior Function Prior Level of Function : Independent/Modified Independent;History of Falls (last six months)             Mobility Comments: ind with RW ADLs  Comments: ind     Extremity/Trunk Assessment   Upper Extremity Assessment Upper Extremity Assessment: Defer to OT evaluation (not formally assessed 2/2 pain)    Lower Extremity Assessment Lower Extremity  Assessment: RLE deficits/detail RLE Deficits / Details: Tibial plateau fx, septic arthritis    Cervical / Trunk Assessment Cervical / Trunk Assessment: Normal  Communication   Communication Communication: No apparent difficulties Cueing Techniques: Verbal cues  Cognition Arousal: Alert Behavior During Therapy: Restless Overall Cognitive Status: Within Functional Limits for tasks assessed       General Comments: Restless due to intense level of pain        General Comments General comments (skin integrity, edema, etc.): Pt HR up to 167 bpm briefly during bed mobility. At rest HR 111 in constant a-fib. RN notified for pain meds due to high level of pain        Assessment/Plan    PT Assessment Patient needs continued PT services  PT Problem List Decreased strength;Decreased range of motion;Decreased balance;Decreased activity tolerance;Decreased mobility;Decreased coordination;Decreased knowledge of use of DME;Decreased safety awareness;Decreased knowledge of precautions;Pain;Obesity;Cardiopulmonary status limiting activity       PT Treatment Interventions DME instruction;Gait training;Stair training;Functional mobility training;Therapeutic activities;Therapeutic exercise;Balance training;Neuromuscular re-education;Patient/family education;Wheelchair mobility training    PT Goals (Current goals can be found in the Care Plan section)  Acute Rehab PT Goals Patient Stated Goal: decrease pain and go home PT Goal Formulation: With patient Time For Goal Achievement: 01/12/23 Potential to Achieve Goals: Good    Frequency Min 1X/week     Co-evaluation PT/OT/SLP Co-Evaluation/Treatment: Yes Reason for Co-Treatment: To address functional/ADL transfers;Other (comment) PT goals addressed during session: Mobility/safety with mobility;Proper use of DME OT goals addressed during session: ADL's and self-care       AM-PAC PT "6 Clicks" Mobility  Outcome Measure Help needed turning  from your back to your side while in a flat bed without using bedrails?: A Little Help needed moving from lying on your back to sitting on the side of a flat bed without using bedrails?: A Little Help needed moving to and from a bed to a chair (including a wheelchair)?: Total Help needed standing up from a chair using your arms (e.g., wheelchair or bedside chair)?: Total Help needed to walk in hospital room?: Total Help needed climbing 3-5 steps with a railing? : Total 6 Click Score: 10    End of Session   Activity Tolerance: Patient limited by pain Patient left: in bed;with call bell/phone within reach;with bed alarm set Nurse Communication: Mobility status PT Visit Diagnosis: Other abnormalities of gait and mobility (R26.89)    Time: 5366-4403 PT Time Calculation (min) (ACUTE ONLY): 20 min   Charges:   PT Evaluation $PT Eval Low Complexity: 1 Low   PT General Charges $$ ACUTE PT VISIT: 1 Visit         Harrel Carina, DPT, CLT  Acute Rehabilitation Services Office: [phone] (Secure chat preferred)   Claudia Desanctis 12/29/2022, 4:03 PM

## 2022-12-29 NOTE — Interval H&P Note (Signed)
History and Physical Interval Note:  12/29/2022 8:30 AM  Jonathan Ibarra  has presented today for surgery, with the diagnosis of Right postoperative infection.  The various methods of treatment have been discussed with the patient and family. After consideration of risks, benefits and other options for treatment, the patient has consented to  Procedure(s): IRRIGATION AND DEBRIDEMENT KNEE (Right) as a surgical intervention.  The patient's history has been reviewed, patient examined, no change in status, stable for surgery.  I have reviewed the patient's chart and labs.  Questions were answered to the patient's satisfaction.     Caryn Bee P Miken Stecher

## 2022-12-29 NOTE — Transfer of Care (Signed)
Immediate Anesthesia Transfer of Care Note  Patient: Stepehn Mcmanigal  Procedure(s) Performed: IRRIGATION AND DEBRIDEMENT KNEE (Right: Knee)  Patient Location: PACU  Anesthesia Type:General  Level of Consciousness: awake and alert   Airway & Oxygen Therapy: Patient Spontanous Breathing and Patient connected to nasal cannula oxygen  Post-op Assessment: Report given to RN and Post -op Vital signs reviewed and stable  Post vital signs: Reviewed and stable  Last Vitals:  Vitals Value Taken Time  BP 129/82 12/29/22 1019  Temp    Pulse 106 12/29/22 1023  Resp 8 12/29/22 1023  SpO2 92 % 12/29/22 1023  Vitals shown include unfiled device data.  Last Pain:  Vitals:   12/29/22 0835  TempSrc:   PainSc: 10-Worst pain ever         Complications: No notable events documented.

## 2022-12-29 NOTE — Progress Notes (Signed)
   12/29/22 1000  TOC Brief Assessment  Insurance and Status Reviewed ([place] [facility])  Patient has primary care physician Yes Candi Leash MD)  Home environment has been reviewed from Home  Prior level of function: Independent  Prior/Current Home Services No current home services  Social Determinants of Health Reivew SDOH reviewed needs interventions  Readmission risk has been reviewed Yes (25%)  Transition of care needs transition of care needs identified, TOC will continue to follow   TOC will continue to follow patient for any additional discharge needs

## 2022-12-29 NOTE — Anesthesia Procedure Notes (Signed)
Procedure Name: Intubation Date/Time: 12/29/2022 9:24 AM  Performed by: Sandie Ano, CRNAPre-anesthesia Checklist: Patient identified, Emergency Drugs available, Suction available and Patient being monitored Patient Re-evaluated:Patient Re-evaluated prior to induction Oxygen Delivery Method: Circle System Utilized Preoxygenation: Pre-oxygenation with 100% oxygen Induction Type: IV induction Ventilation: Mask ventilation without difficulty Laryngoscope Size: Mac and 3 Grade View: Grade II Tube type: Oral Tube size: 7.0 mm Number of attempts: 1 Airway Equipment and Method: Stylet and Oral airway Placement Confirmation: ETT inserted through vocal cords under direct vision, positive ETCO2 and breath sounds checked- equal and bilateral Secured at: 22 cm Tube secured with: Tape Dental Injury: Teeth and Oropharynx as per pre-operative assessment

## 2022-12-29 NOTE — Evaluation (Signed)
Occupational Therapy Evaluation Patient Details Name: Jonathan Ibarra MRN: [id number] DOB: 12/17/1965 Today's Date: 12/29/2022   History of Present Illness Pt is a 57 y/o male presenting 10/28 to [facility] with severe right knee pain and drainage after a fall the night PTA, he is now s/p R knee I&D on 12/29/22. He recently had a R tibial ORIF on 12/13/22 after a falls incidence. XBJ:YNWGNFA abuse, [name], HTN, liver cirrhosis, polysubstance abuse.   Clinical Impression   Pt admitted for above, lives with friend and reports prn assistance, he states just one fall recently but he had a fall last admission which led to him needing an ORIF of R knee originally. Unable to progress OOB due to pain and high HR levels, needs min A for bed mobility. Pt needing Total A to setup assist for ADLs, increased need for assist mostly with LB ADLs. Pt would benefit from continued acute skilled OT services to address deficits and help transition to next level of care. Patient would benefit from post acute Home OT services to help maximize functional independence in natural environment. Curious to note if pt ever got an aide to help with ADLs when friend is unavailable.        If plan is discharge home, recommend the following: A lot of help with bathing/dressing/bathroom;Assistance with cooking/housework;Help with stairs or ramp for entrance;Assist for transportation;A lot of help with walking and/or transfers    Functional Status Assessment  Patient has had a recent decline in their functional status and demonstrates the ability to make significant improvements in function in a reasonable and predictable amount of time.  Equipment Recommendations  Tub/shower bench    Recommendations for Other Services       Precautions / Restrictions Precautions Precautions: Fall Restrictions Weight Bearing Restrictions: Yes RLE Weight Bearing: Non weight bearing Other Position/Activity Restrictions: no formal orders but  was NWB last admit during 10/16 notes      Mobility Bed Mobility Overal bed mobility: Needs Assistance Bed Mobility: Supine to Sit, Sit to Supine     Supine to sit: Min assist, [name] Sit to supine: Min assist, [name]   General bed mobility comments: Min A for RLE management    Transfers                   General transfer comment: deferred due to pain and HR      Balance Overall balance assessment: Needs assistance Sitting-balance support: No upper extremity supported, Feet supported Sitting balance-Leahy Scale: Fair         Standing balance comment: deferred                           ADL either performed or assessed with clinical judgement   ADL       Grooming: Set up;Sitting;Wash/dry face       Lower Body Bathing: Sitting/lateral leans;Maximal assistance   Upper Body Dressing : Sitting;Set up   Lower Body Dressing: Sit to/from stand;Total assistance     Toilet Transfer Details (indicate cue type and reason): deferred transfer at this time, limited by pain and high HR with mobility Toileting- Clothing Manipulation and Hygiene:  (Using urinal in bed with setup assist)     Tub/Shower Transfer Details (indicate cue type and reason): Pt reports not performing tub transfers since being NWB         Vision         Perception  Praxis         Pertinent Vitals/Pain Pain Assessment Pain Assessment: 0-10 Pain Score: 10-Worst pain ever Pain Location: RLE Pain Descriptors / Indicators: Discomfort, Grimacing, Crying, Moaning Pain Intervention(s): Limited activity within patient's tolerance, Repositioned, Patient requesting pain meds-RN notified     Extremity/Trunk Assessment Upper Extremity Assessment Upper Extremity Assessment: Overall WFL for tasks assessed (not formally assessed 2/2 pain)   Lower Extremity Assessment Lower Extremity Assessment: Defer to PT evaluation   Cervical / Trunk Assessment Cervical /  Trunk Assessment: Normal   Communication Communication Communication: No apparent difficulties Cueing Techniques: Verbal cues   Cognition Arousal: Alert Behavior During Therapy: Restless Overall Cognitive Status: Within Functional Limits for tasks assessed                                 General Comments: Restless due to intense level of pain     General Comments  HR 111 bpm at rest but in constant afib, HR maxing at 167 bpm with mobility to EOB. RN notified of need for pain meds and of HR.    Exercises     Shoulder Instructions      Home Living Family/patient expects to be discharged to:: Private residence Living Arrangements: Non-relatives/Friends Available Help at Discharge: Available PRN/intermittently;Friend(s) Type of Home: House Home Access: Stairs to enter [employer] of Steps: 2 Entrance Stairs-Rails: Right Home Layout: One level     Bathroom Shower/Tub: Chief Strategy Officer: Standard     Home Equipment: Cane - single Librarian, academic (2 wheels);BSC/3in1;Wheelchair - manual   Additional Comments: Reports 1 fall leading to admission, went to turn a light on and overextended. Pt did have another fall that led to last admission 1-2 weeks ago      Prior Functioning/Environment Prior Level of Function : Independent/Modified Independent;History of Falls (last six months)             Mobility Comments: ind with RW ADLs Comments: ind        OT Problem List: Decreased strength;Impaired balance (sitting and/or standing);Pain;Decreased safety awareness      OT Treatment/Interventions: Self-care/ADL training;Therapeutic exercise;DME and/or AE instruction;Therapeutic activities;Balance training;Patient/family education    OT Goals(Current goals can be found in the care plan section) Acute Rehab OT Goals Patient Stated Goal: none stated, pt fixated on pain OT Goal Formulation: Patient unable to participate in goal  setting (focused on pain) Time For Goal Achievement: 01/12/23 Potential to Achieve Goals: Good ADL Goals Pt Will Perform Grooming: with modified independence;sitting Pt Will Perform Lower Body Bathing: with modified independence;sitting/lateral leans Pt Will Perform Lower Body Dressing: with modified independence;sitting/lateral leans Pt Will Transfer to Toilet: ambulating;with supervision;bedside commode Pt Will Perform Tub/Shower Transfer: with modified independence;Tub transfer;tub bench;rolling walker  OT Frequency: Min 1X/week    Co-evaluation PT/OT/SLP Co-Evaluation/Treatment: Yes Reason for Co-Treatment: To address functional/ADL transfers;Other (comment) (pt not likely to tolerate another session) PT goals addressed during session: Mobility/safety with mobility;Proper use of DME OT goals addressed during session: ADL's and self-care      AM-PAC OT "6 Clicks" Daily Activity     Outcome Measure Help from another person eating meals?: None Help from another person taking care of personal grooming?: A Little Help from another person toileting, which includes using toliet, bedpan, or urinal?: A Lot Help from another person bathing (including washing, rinsing, drying)?: A Lot Help from another person to put on and taking off regular  upper body clothing?: A Little Help from another person to put on and taking off regular lower body clothing?: Total 6 Click Score: 15   End of Session Equipment Utilized During Treatment: Oxygen (2L) Nurse Communication: Mobility status;Patient requests pain meds  Activity Tolerance: Patient limited by pain Patient left: in bed;with call bell/phone within reach;with bed alarm set  OT Visit Diagnosis: Pain;Unsteadiness on feet (R26.81);Other abnormalities of gait and mobility (R26.89);History of falling (Z91.81) Pain - Right/Left: Right Pain - part of body: Knee                Time: 8295-6213 OT Time Calculation (min): 20 min Charges:  OT General  Charges $OT Visit: 1 Visit OT Evaluation $OT Eval Moderate Complexity: 1 Mod  12/29/2022  AB, OTR/L  Acute Rehabilitation Services  Office: [phone]   Tristan Schroeder 12/29/2022, 3:22 PM

## 2022-12-29 NOTE — Plan of Care (Signed)
  Problem: Education: Goal: Knowledge of disease or condition will improve Outcome: Progressing Goal: Understanding of medication regimen will improve Outcome: Progressing   Problem: Activity: Goal: Ability to tolerate increased activity will improve Outcome: Progressing   Problem: Education: Goal: Knowledge of General Education information will improve Description: Including pain rating scale, medication(s)/side effects and non-pharmacologic comfort measures Outcome: Progressing   Problem: Clinical Measurements: Goal: Diagnostic test results will improve Outcome: Progressing Goal: Respiratory complications will improve Outcome: Progressing

## 2022-12-29 NOTE — Op Note (Signed)
Orthopaedic Surgery Operative Note (CSN: [id number] ) Date of Surgery: 0/30/2024  Admit Date: 12/27/2022   Diagnoses: Pre-Op Diagnoses: Right tibial plateau fracture Right postoperative infection  Post-Op Diagnosis: Right tibial plateau fracture Right postoperative infection Right septic knee  Procedures: CPT 27310-Incision and drainage of right knee CPT 10180-Irrigation and debridement of right knee postoperative infection CPT 97605-Incisional wound vac right knee  Surgeons : Primary: Roby Lofts, MD  Assistant: Ulyses Southward, PA-C  Location: OR 3   Anesthesia: General   Antibiotics: 1 gm vancomycin powder and 1.2gm tobramycin powder placed topically   Tourniquet time: None    Estimated Blood Loss:100 mL  Complications:* No complications entered in OR log *   Specimens: ID Type Source Tests Collected by Time Destination  A : Right knee abscess #1 Body Fluid Joint, Other AEROBIC/ANAEROBIC CULTURE W GRAM STAIN (SURGICAL/DEEP WOUND) Roby Lofts, MD 12/29/[id number]   B : Right knee abscess #2 Body Fluid Joint, Other AEROBIC/ANAEROBIC CULTURE W GRAM STAIN (SURGICAL/DEEP WOUND) Roby Lofts, MD 12/29/2022 [phone]      Implants: * No implants in log *   Indications for Surgery: 57 year old male with multiple medical comorbidities and substance abuse disorder who underwent open reduction internal fixation of his right tibial plateau on December 13, 2022.  He had some postoperative drainage initially postoperatively that resolved prior to discharge.  Patient presented with draining from his leg with increased pain.  The workup revealed potential infection.  He was also had A-fib and RVR.  Due to the persistent drainage and recent operation I recommend proceeding I&D with cultures.  Risks and benefits were discussed with the patient.  He agreed to proceed with surgery and consent was obtained.  Operative Findings: 1.  Complex postoperative infection right tibial plateau  that extended down to the hardware.  No signs of acute osteomyelitis.  2.  Incision and drainage of right knee with murky looking synovial fluid that was sent for culture as well.  Procedure: The patient was identified in the preoperative holding area. Consent was confirmed with the patient and their family and all questions were answered. The operative extremity was marked after confirmation with the patient. he was then brought back to the operating room by our anesthesia colleagues.  He was placed under general anesthetic and carefully transferred over to radiolucent flattop table.  A bump was placed under his operative hip.  The right lower extremity was then prepped and draped in usual sterile fashion.  Timeout was performed to verify the patient, the procedure, and the extremity.  Preoperative antibiotics had already been dosed as he is on scheduled vancomycin.  Pressure was applied to the surgical incision where there was drainage and purulent material was expressed out.  This was sent for culture.  I reopened the incision and the purulent material was directly down to the hardware.  There is no signs of acute osteomyelitis.  However the contamination did appear to extend to the submeniscal arthrotomy and presumed involvement of the knee joint.  I then extended my incision proximally and performed a arthrotomy right lateral to the patella above the meniscus.  I entered the knee joint and proceeded to encountered murky fluid of the synovial fluid.  I sent this combined with some of the other purulent material around the plate for culture.  I then performed low-pressure pulsatile lavage with 3 L normal saline.  Instruments were and gloves were changed.  I then closed the arthrotomy with 0 PDS.  Placed a  gram of vancomycin powder and 1.2 g of tobramycin powder into the wound.  I then closed the incision with 2-0 nylon the incisional wound VAC was placed.  Patient was then awoke from anesthesia and taken to  the PACU in stable condition.   Post Op Plan/Instructions: The patient will be needed to be placed on broad-spectrum antibiotics.  He will maintain nonweightbearing precautions.  He will continue with the incisional wound VAC for at least 72 hours.  He will need an infectious disease consult for antibiotic management as he will likely need prolonged antibiotic course due to the hardware involvement.  We will follow-up cultures to determine speciation and sensitivity of the infection.   I was present and performed the entire surgery.  Ulyses Southward, PA-C did assist me throughout the case. An assistant was necessary given the difficulty in approach, maintenance of reduction and ability to instrument the fracture.   Truitt Merle, MD [facility]

## 2022-12-29 NOTE — Progress Notes (Signed)
PROGRESS NOTE    Jonathan Ibarra  ZOX:[id number] DOB: [birth year] DOA: 12/27/2022 PCP: Candi Leash, MD   Chief Complaint  Patient presents with   Knee Pain   Fall    Brief Narrative:   Jonathan Ibarra is a 57 y.o. male with medical history significant of hepatic cirrhosis, hepatic encephalopathy, thrombocytopenia, essential hypertension, polysubstance abuse, and recent admission 12/12/2022 post trauma after falling 6 feet down a river bed requiring ORIF of right knee. He presents to [facility] ED with severe right knee pain after a fall last night. He describes the pain as constant, severe 12/10, and aching. He reports not having supplies to clean the surgical site or change dressings at home and tan/yellow purulent discharge coming from site over the last week. He also reports having significant pain at the site since discharge after oxycodone was decreased to 5 mg from 5-10mg .    ED Course: On arrival to the hospital ED afebrile temp 36.9C, BP 119/97, HR 104, RR 18, SPO2 100% on RA.  Labs notable for sodium 132, albumin 2.3, elevated total bilirubin at 2.2 with normal AST/ALT no elevation in alk phos. Albumin 2.3. PLTs 401, UDS positive for methamphetamine, cocaine, THC.  Troponin negative x 2.  Plain film of the R knee obtained and did not show new fracture or alignment issue, did show moderate knee joint effusion.  HR later increased to 140s with palpitations, he was noted to be in Atrial Fibrillation with Rapid Ventricular Response on telemetry, and was started on Cardizem.  Blood cultures drawn and in process.  Chest x-ray obtained and negative.  EDP consulted Dr Jena Gauss with Ortho who recommended holding anticoagulation to rule out hemarthrosis of (R) knee and starting doxycycline. [facility] contacted for admission.   Assessment & Plan:   Principal Problem:   Septic arthritis of knee, right (HCC) Active Problems:   Atrial fibrillation with rapid ventricular response (HCC)    Essential hypertension   Knee effusion, right   Septic Arthritis of (R) Knee  -With significant purulent discharge from his right knee, concern for septic arthritis -Plan for I&D today -Await on intraoperative culture and findings, likely will need prolonged course of IV antibiotics  -Blood cultures, so far remains negative  A-fib with RVR -Heart rate uncontrolled on presentation, this is most likely in the setting of polysubstance abuse including cocaine, methamphetamine and sepsis -Heart rate better controlled on IV Cardizem continue with Cardizem drip till after surgery. -Continue with metoprolol -So far no anticoagulation secondary to poor compliance, falls, polysubstance abuse, (as well his CHA2DS2-VASc score is 1) -H within normal limit -2D echo in August with preserved EF    Polysubstance abuse  UDS positive for methamphetamine, THC, and cocaine. Patient reports he has not used ETOH in years. - Counseled for cessation  Liver cirrhosis  Status post TIPS. Normal liver enzymes. Has history of hepatitis C - Continue home lactulose, lasix, spironolactone     Hypertension  -Continue home spironolactone, metoprolol, Lasix    Seizure disorder On any AED,   DVT prophylaxis: Will await recommendation by Ortho after surgery Code Status: Full code Family Communication: None at bedside Disposition:   Status is: Inpatient    Consultants:  Orhto   Subjective:  No significant events overnight, he reports some pain in the right knee area,  Objective: Vitals:   12/28/22 2300 12/29/22 0400 12/29/22 0742 12/29/22 0826  BP: 110/63 112/85 115/72 111/71  Pulse: 80 81 85 100  Resp: 15 18 18  18  Temp: 98 F (36.7 C) 97.9 F (36.6 C) 98.6 F (37 C) 97.7 F (36.5 C)  TempSrc: Oral Axillary Oral Oral  SpO2: 92% 91%  97%    Intake/Output Summary (Last 24 hours) at 12/29/2022 0946 Last data filed at 12/29/2022 1610 Gross per 24 hour  Intake 691.78 ml  Output 400 ml  Net  291.78 ml   There were no vitals filed for this visit.  Examination:  Awake Alert, Oriented X 3, No new F.N deficits, Normal affect Symmetrical Chest wall movement, Good air movement bilaterally, CTAB RRR,No Gallops,Rubs or new Murmurs, No Parasternal Heave +ve B.Sounds, Abd Soft, No tenderness, No rebound - guarding or rigidity. No Cyanosis, Clubbing, right knee edema, sutures present, please see picture below     Data Reviewed: I have personally reviewed following labs and imaging studies  CBC: Recent Labs  Lab 12/27/22 1657 12/29/22 0431  WBC 10.2 7.4  NEUTROABS 7.5  --   HGB 13.3 11.7*  HCT 40.7 34.7*  MCV 92.5 91.1  PLT 401* 353    Basic Metabolic Panel: Recent Labs  Lab 12/27/22 1647 12/29/22 0431  NA 132* 130*  K 3.8 4.2  CL 103 102  CO2 25 24  GLUCOSE 109* 102*  BUN 7 14  CREATININE 0.75 0.77  CALCIUM 8.6* 8.4*    GFR: Estimated Creatinine Clearance: 140 mL/min (by C-G formula based on SCr of 0.77 mg/dL).  Liver Function Tests: Recent Labs  Lab 12/27/22 1647 12/29/22 0431  AST 23 20  ALT 14 12  ALKPHOS 114 103  BILITOT 2.2* 1.6*  PROT 7.2 6.2*  ALBUMIN 2.3* 2.0*    CBG: No results for input(s): "GLUCAP" in the last 168 hours.   Recent Results (from the past 240 hour(s))  Blood culture (routine x 2)     Status: None (Preliminary result)   Collection Time: 12/28/22  1:28 AM   Specimen: BLOOD RIGHT ARM  Result Value Ref Range Status   Specimen Description BLOOD RIGHT ARM  Final   Special Requests   Final    BOTTLES DRAWN AEROBIC AND ANAEROBIC Blood Culture adequate volume   Culture   Final    NO GROWTH 1 DAY Performed at [facility], [address] [place], [place] [address]    Report Status PENDING  Incomplete  Blood culture (routine x 2)     Status: None (Preliminary result)   Collection Time: 12/28/22  1:29 AM   Specimen: BLOOD LEFT HAND  Result Value Ref Range Status   Specimen Description BLOOD LEFT HAND  Final    Special Requests   Final    BOTTLES DRAWN AEROBIC ONLY Blood Culture adequate volume   Culture  Setup Time   Final    GRAM POSITIVE RODS AEROBIC BOTTLE ONLY CRITICAL RESULT CALLED TO, READ BACK BY AND VERIFIED WITH: V BRYK,PHARMD@[id number]  12/29/22 MK Performed at [facility], [address], [place], [place] [address]    Culture GRAM POSITIVE RODS  Final   Report Status PENDING  Incomplete         Radiology Studies: DG Chest Portable 1 View  Result Date: 12/28/2022 CLINICAL DATA:  Pain EXAM: PORTABLE CHEST 1 VIEW COMPARISON:  Radiograph 12/12/2022 FINDINGS: Stable enlargement of the cardiomediastinal silhouette. Pulmonary vascular congestion. No focal consolidation, pleural effusion, or pneumothorax. Subacute anterior left sixth rib fracture. IMPRESSION: No active disease. Electronically Signed   By: Minerva Fester M.D.   On: 12/28/2022 03:36   DG Knee Complete 4  Views Right  Result Date: 12/27/2022 CLINICAL DATA:  6 foot fall 2 weeks ago injuring right leg that he had surgery on 2 weeks ago. Unable to pick up antibiotic prescription. Drainage coming from postop site. Another fall 2 days ago. EXAM: RIGHT KNEE - COMPLETE 4+ VIEW COMPARISON:  Radiograph 12/13/2022 FINDINGS: Lateral plate and screw fixation of the proximal tibial metadiaphysis. No radiographic evidence of loosening. Fracture lines remain visible. Unchanged alignment. No evidence of acute fracture. Moderate knee joint effusion. Soft tissue swelling about the knee. IMPRESSION: 1. Unchanged lateral plate and screw fixation across the comminuted fracture involving the medial and lateral tibial plateaus. No change in alignment or evidence of new fracture. 2. Moderate knee joint effusion. Electronically Signed   By: Minerva Fester M.D.   On: 12/27/2022 20:25   VAS [place] LOWER EXTREMITY VENOUS (DVT) (ONLY [facility] & [facility])  Result Date: 12/27/2022  Lower Venous DVT Study Patient Name:  DRAXTON PARROW  Date of Exam:   12/27/2022  Medical Rec #: [id number]           Accession #:    [id number] Date of Birth: Apr 27, 1965          Patient Gender: M Patient Age:   45 years Exam Location:  [facility] Procedure:      VAS [place] LOWER EXTREMITY VENOUS (DVT) Referring Phys: Alvira Monday --------------------------------------------------------------------------------  Indications: Pain.  Risk Factors: Surgery Trauma. Limitations: Body habitus and poor ultrasound/tissue interface. Comparison Study: No prior studies. Performing Technologist: Chanda Busing RVT  Examination Guidelines: A complete evaluation includes B-mode imaging, spectral Doppler, color Doppler, and power Doppler as needed of all accessible portions of each vessel. Bilateral testing is considered an integral part of a complete examination. Limited examinations for reoccurring indications may be performed as noted. The reflux portion of the exam is performed with the patient in reverse Trendelenburg.  +---------+---------------+---------+-----------+----------+-------------------+ RIGHT    CompressibilityPhasicitySpontaneityPropertiesThrombus Aging      +---------+---------------+---------+-----------+----------+-------------------+ CFV      Full           Yes      Yes                                      +---------+---------------+---------+-----------+----------+-------------------+ SFJ      Full                                                             +---------+---------------+---------+-----------+----------+-------------------+ FV Prox  Full                                                             +---------+---------------+---------+-----------+----------+-------------------+ FV Mid   Full                                                             +---------+---------------+---------+-----------+----------+-------------------+ FV Distal  Yes      Yes                                       +---------+---------------+---------+-----------+----------+-------------------+ PFV      Full                                                             +---------+---------------+---------+-----------+----------+-------------------+ POP      Full           Yes      Yes                                      +---------+---------------+---------+-----------+----------+-------------------+ PTV      Full                                                             +---------+---------------+---------+-----------+----------+-------------------+ PERO                                                  Not well visualized +---------+---------------+---------+-----------+----------+-------------------+   +----+---------------+---------+-----------+----------+--------------+ LEFTCompressibilityPhasicitySpontaneityPropertiesThrombus Aging +----+---------------+---------+-----------+----------+--------------+ CFV Full           Yes      Yes                                 +----+---------------+---------+-----------+----------+--------------+    Summary: RIGHT: - There is no evidence of deep vein thrombosis in the lower extremity. However, portions of this examination were limited- see technologist comments above.  - No cystic structure found in the popliteal fossa.  LEFT: - No evidence of common femoral vein obstruction.   *See table(s) above for measurements and observations.    Preliminary         Scheduled Meds:  [MAR Hold] folic acid  1 mg Oral Daily   [MAR Hold] furosemide  40 mg Oral Daily   [MAR Hold] lactulose  20 g Oral BID   [MAR Hold] metoprolol tartrate  25 mg Oral BID   [MAR Hold] sodium chloride flush  3 mL Intravenous Q12H   [MAR Hold] spironolactone  25 mg Oral Daily   [MAR Hold] Tdap  0.5 mL Intramuscular Once   [MAR Hold] thiamine  100 mg Oral Daily   Continuous Infusions:  diltiazem (CARDIZEM) infusion Stopped (12/28/22 1445)   [MAR Hold]  piperacillin-tazobactam (ZOSYN)  IV 3.375 g (12/29/22 0541)   [MAR Hold] vancomycin 1,000 mg (12/29/22 0539)     LOS: 1 day     Huey Bienenstock, MD [facility]   To contact the attending provider between 7A-7P or the covering provider during after hours 7P-7A, please log into the web site [email or web address] and access using universal [place] password for that web site. If you do not have  the password, please call the hospital operator.  12/29/2022, 9:46 AM

## 2022-12-29 NOTE — Anesthesia Preprocedure Evaluation (Addendum)
Anesthesia Evaluation  Patient identified by MRN, date of birth, ID band Patient awake    Reviewed: Allergy & Precautions, NPO status , Patient's Chart, lab work & pertinent test results, reviewed documented beta blocker date and time   History of Anesthesia Complications Negative for: history of anesthetic complications  Airway Mallampati: III  TM Distance: >3 FB Neck ROM: Full    Dental  (+) Poor Dentition, Chipped, Missing   Pulmonary neg COPD, Current Smoker and Patient abstained from smoking.   breath sounds clear to auscultation       Cardiovascular hypertension, + CAD and + Past MI  (-) Cardiac Stents, (-) CABG and (-) CHF + dysrhythmias Atrial Fibrillation  Rhythm:Irregular Rate:Normal  Last TTE ok   Neuro/Psych Seizures -,     GI/Hepatic ,GERD  ,,(+) Cirrhosis     substance abuse  , Hepatitis -, CINR 1.2   Endo/Other    Renal/GU      Musculoskeletal  (+) Arthritis ,    Abdominal   Peds  Hematology   Anesthesia Other Findings   Reproductive/Obstetrics                             Anesthesia Physical Anesthesia Plan  ASA: 4  Anesthesia Plan: General   Post-op Pain Management:    Induction: Intravenous  PONV Risk Score and Plan: 1 and Ondansetron  Airway Management Planned: Oral ETT  Additional Equipment:   Intra-op Plan:   Post-operative Plan: Extubation in OR  Informed Consent: I have reviewed the patients History and Physical, chart, labs and discussed the procedure including the risks, benefits and alternatives for the proposed anesthesia with the patient or authorized representative who has indicated his/her understanding and acceptance.     Dental advisory given  Plan Discussed with: CRNA  Anesthesia Plan Comments:         Anesthesia Quick Evaluation

## 2022-12-29 NOTE — Progress Notes (Signed)
PHARMACY - PHYSICIAN COMMUNICATION CRITICAL VALUE ALERT - BLOOD CULTURE IDENTIFICATION (BCID)  Jonathan Ibarra is an 57 y.o. male who presented to [facility] on 12/27/2022 with a chief complaint of surgical site infection.  Assessment:  Started on broad-spectrum ABX and plan for OR this am for I&D of knee, growing GPR in 1 of 3 blood cx bottles, likely contaminant.  Name of physician (or Provider) Contacted: JGardner DO  Current antibiotics: vancomycin and Zosyn  Changes to prescribed antibiotics recommended:  No changes for now.  Results for orders placed or performed during the hospital encounter of 09/30/22  Blood Culture ID Panel (Reflexed) (Collected: 09/30/2022 10:46 PM)  Result Value Ref Range   Enterococcus faecalis NOT DETECTED NOT DETECTED   Enterococcus Faecium NOT DETECTED NOT DETECTED   Listeria monocytogenes NOT DETECTED NOT DETECTED   Staphylococcus species NOT DETECTED NOT DETECTED   Staphylococcus aureus (BCID) NOT DETECTED NOT DETECTED   Staphylococcus epidermidis NOT DETECTED NOT DETECTED   Staphylococcus lugdunensis NOT DETECTED NOT DETECTED   Streptococcus species DETECTED (A) NOT DETECTED   Streptococcus agalactiae NOT DETECTED NOT DETECTED   Streptococcus pneumoniae NOT DETECTED NOT DETECTED   Streptococcus pyogenes DETECTED (A) NOT DETECTED   A.calcoaceticus-baumannii NOT DETECTED NOT DETECTED   Bacteroides fragilis NOT DETECTED NOT DETECTED   Enterobacterales NOT DETECTED NOT DETECTED   Enterobacter cloacae complex NOT DETECTED NOT DETECTED   Escherichia coli NOT DETECTED NOT DETECTED   Klebsiella aerogenes NOT DETECTED NOT DETECTED   Klebsiella oxytoca NOT DETECTED NOT DETECTED   Klebsiella pneumoniae NOT DETECTED NOT DETECTED   Proteus species NOT DETECTED NOT DETECTED   Salmonella species NOT DETECTED NOT DETECTED   Serratia marcescens NOT DETECTED NOT DETECTED   Haemophilus influenzae NOT DETECTED NOT DETECTED   Neisseria meningitidis NOT  DETECTED NOT DETECTED   Pseudomonas aeruginosa NOT DETECTED NOT DETECTED   Stenotrophomonas maltophilia NOT DETECTED NOT DETECTED   Candida albicans NOT DETECTED NOT DETECTED   Candida auris NOT DETECTED NOT DETECTED   Candida glabrata NOT DETECTED NOT DETECTED   Candida krusei NOT DETECTED NOT DETECTED   Candida parapsilosis NOT DETECTED NOT DETECTED   Candida tropicalis NOT DETECTED NOT DETECTED   Cryptococcus neoformans/gattii NOT DETECTED NOT DETECTED    Vernard Gambles, PharmD, BCPS  12/29/2022  4:16 AM

## 2022-12-30 ENCOUNTER — Encounter (HOSPITAL_COMMUNITY): Payer: Self-pay | Admitting: Student

## 2022-12-30 DIAGNOSIS — M009 Pyogenic arthritis, unspecified: Secondary | ICD-10-CM | POA: Diagnosis not present

## 2022-12-30 DIAGNOSIS — I10 Essential (primary) hypertension: Secondary | ICD-10-CM

## 2022-12-30 DIAGNOSIS — M00261 Other streptococcal arthritis, right knee: Secondary | ICD-10-CM | POA: Diagnosis not present

## 2022-12-30 DIAGNOSIS — I4891 Unspecified atrial fibrillation: Secondary | ICD-10-CM

## 2022-12-30 LAB — BASIC METABOLIC PANEL
Anion gap: 7 (ref 5–15)
BUN: 12 mg/dL (ref 6–20)
CO2: 24 mmol/L (ref 22–32)
Calcium: 8.6 mg/dL — ABNORMAL LOW (ref 8.9–10.3)
Chloride: 100 mmol/L (ref 98–111)
Creatinine, Ser: 0.78 mg/dL (ref 0.61–1.24)
GFR, Estimated: >60 mL/min (ref >60)
Glucose, Bld: 93 mg/dL (ref 70–99)
Potassium: 4.2 mmol/L (ref 3.5–5.1)
Sodium: 131 mmol/L — ABNORMAL LOW (ref 135–145)

## 2022-12-30 LAB — CBC
HCT: 35.6 % — ABNORMAL LOW (ref 39.0–52.0)
Hemoglobin: 12 g/dL — ABNORMAL LOW (ref 13.0–17.0)
MCH: 30.6 pg (ref 26.0–34.0)
MCHC: 33.7 g/dL (ref 30.0–36.0)
MCV: 90.8 fL (ref 80.0–100.0)
Platelets: 371 10*3/uL (ref 150–400)
RBC: 3.92 MIL/uL — ABNORMAL LOW (ref 4.22–5.81)
RDW: 14.6 % (ref 11.5–15.5)
WBC: 7.4 10*3/uL (ref 4.0–10.5)
nRBC: 0 % (ref 0.0–0.2)

## 2022-12-30 LAB — PHOSPHORUS: Phosphorus: 2.7 mg/dL (ref 2.5–4.6)

## 2022-12-30 LAB — MAGNESIUM: Magnesium: 1.6 mg/dL — ABNORMAL LOW (ref 1.7–2.4)

## 2022-12-30 MED ORDER — SODIUM CHLORIDE 0.9 % IV SOLN
2.0000 g | INTRAVENOUS | Status: DC
  Administered 2022-12-30: 2 g via INTRAVENOUS
  Filled 2022-12-30: qty 20

## 2022-12-30 MED ORDER — KETOROLAC TROMETHAMINE 15 MG/ML IJ SOLN
30.0000 mg | Freq: Four times a day (QID) | INTRAMUSCULAR | Status: AC
  Administered 2022-12-30 – 2023-01-02 (×12): 30 mg via INTRAVENOUS
  Filled 2022-12-30 (×12): qty 2

## 2022-12-30 MED ORDER — HYDROMORPHONE HCL 1 MG/ML IJ SOLN
0.5000 mg | INTRAMUSCULAR | Status: DC | PRN
  Administered 2022-12-30 – 2023-01-09 (×39): 1 mg via INTRAVENOUS
  Filled 2022-12-30 (×40): qty 1

## 2022-12-30 NOTE — Consult Note (Signed)
[facility]    Date of Admission:  12/27/2022     Total days of antibiotics 2   Vancomycin  Ceftriaxone        Reason for Consult: Septic Arthritis, Post Op Infection s/p ORIF, Bacteremia    Referring Provider: H[place]ix  Primary Care Provider: Candi Leash, MD    Assessment: Jonathan Ibarra is a 57 y.o. male admitted with knee pain and effusion.   Septic Arthritis of the Right Knee -  Post op Infection of Tibial Plateau Fx s/p ORIF and meniscus tear repair -  -Intra op cultures growing out rare klebsiella pneumoniae in both cultures.  -Continue ceftriaxone and follow for susceptibilities.  -blood cultures from 10/29 with GPR, see below   Bacteremia -  GPR in aerobic 1/2 sites -  -pending ID. Continue vancomycin for now -may reflect skin contaminant.   H/O Hepatitis C s/p eradication -  Cirrhosis with h/o TIPS for decompensation -  -Viral RNA was not detected 2 weeks ago -would benefit from hepatitis b vaccine given no immunity - will order first dose closer to hospital D/C    Plan: Continue vancomycin pending ID on blood culture  Continue ceftriaxone  Follow pending micro from OR 10/30 for further susceptibility information.    Principal Problem:   Septic arthritis of knee, right (HCC) Active Problems:   Essential hypertension   Atrial fibrillation with rapid ventricular response (HCC)   Knee effusion, right    enoxaparin (LOVENOX) injection  40 mg Subcutaneous Q24H   folic acid  1 mg Oral Daily   furosemide  40 mg Oral Daily   ketorolac  30 mg Intravenous Q6H   lactulose  20 g Oral BID   metoprolol tartrate  25 mg Oral BID   sodium chloride flush  3 mL Intravenous Q12H   spironolactone  25 mg Oral Daily   Tdap  0.5 mL Intramuscular Once   thiamine  100 mg Oral Daily    HPI: Jonathan Ibarra is a 57 y.o. male admitted to the hospital for knee pain s/p falls.   PMHx: hepatic cirrhosis, thrombocytopenia, HTN,  substance abuse (smokes meth and cocaine, no injection use, active intermittent use), depression.   Recently underwent ORIF 12/13/2022 to repair tibial plateau fracture after falling into a river bed walking [place] he woods - states the ground gave way and he fell 4-6 feet. He was intoxicated on arrival to the hospital at this encounter.  In the ER he was started empirically on vancomycin + zosyn.   Op Note reviewed from yesterday's I&D w/ Dr. Jena Gauss -  Right postop infection requiring I&D of right knee with incisional wound vac application  Purulent drainage coming out of old incision. Murky looking synovial fluid encountered in the right knee joint. NO evidence of osteomyelitis. The infection did extend down to the hardware, which was retained.   He has an old bottle of bactrim with him in his bag of medications that he states he has not taken in > 1 month. No allergies to other antibiotics. Currently staying "around [place] area" .    Review of Systems: Review of Systems  Constitutional:  Negative for chills and fever.  HENT:  Negative for tinnitus.   Eyes:  Negative for blurred vision and photophobia.  Respiratory:  Negative for cough and sputum production.   Cardiovascular:  Negative for chest pain.  Gastrointestinal:  Negative for diarrhea, nausea and vomiting.  Genitourinary:  Negative for dysuria.  Musculoskeletal:  Positive for joint pain.  Skin:  Negative for rash.  Neurological:  Negative for headaches.     Past Medical History:  Diagnosis Date   Alcohol abuse    Cirrhosis of liver (HCC)    Hypertension     Social History   Tobacco Use   Smoking status: Every Day    Current packs/day: 0.25    Types: Cigarettes   Smokeless tobacco: Never  Vaping Use   Vaping status: Every Day  Substance Use Topics   Alcohol use: No   Drug use: Yes    Types: Cocaine, Marijuana    History reviewed. No pertinent family history. Allergies  Allergen Reactions   Tylenol  [Acetaminophen] Other (See Comments)    Impacts liver    OBJECTIVE: Blood pressure (!) 90/51, pulse 87, temperature 97.6 F (36.4 C), temperature source Oral, resp. rate 15, SpO2 93%.  Physical Exam Vitals reviewed.  Constitutional:      Appearance: Normal appearance. He is not ill-appearing.  HENT:     Mouth/Throat:     Mouth: Mucous membranes are moist.     Pharynx: Oropharynx is clear.  Cardiovascular:     Rate and Rhythm: Normal rate. Rhythm irregular.  Pulmonary:     Effort: Pulmonary effort is normal.     Breath sounds: Normal breath sounds.  Abdominal:     General: Bowel sounds are normal. There is no distension.     Palpations: Abdomen is soft.  Musculoskeletal:     Comments: Clean OR dressing  Skin:    General: Skin is warm and dry.     Comments: Multiple tattoos  Neurological:     Mental Status: He is alert and oriented to person, place, and time.     Lab Results Lab Results  Component Value Date   WBC 7.4 12/30/2022   HGB 12.0 (L) 12/30/2022   HCT 35.6 (L) 12/30/2022   MCV 90.8 12/30/2022   PLT 371 12/30/2022    Lab Results  Component Value Date   CREATININE 0.78 12/30/2022   BUN 12 12/30/2022   NA 131 (L) 12/30/2022   K 4.2 12/30/2022   CL 100 12/30/2022   CO2 24 12/30/2022    Lab Results  Component Value Date   ALT 12 12/29/2022   AST 20 12/29/2022   ALKPHOS 103 12/29/2022   BILITOT 1.6 (H) 12/29/2022     Microbiology: Recent Results (from the past 240 hour(s))  Blood culture (routine x 2)     Status: None (Preliminary result)   Collection Time: 12/28/22  1:28 AM   Specimen: BLOOD RIGHT ARM  Result Value Ref Range Status   Specimen Description BLOOD RIGHT ARM  Final   Special Requests   Final    BOTTLES DRAWN AEROBIC AND ANAEROBIC Blood Culture adequate volume   Culture   Final    NO GROWTH 2 DAYS Performed at [facility], [address] [place], [place] [address]    Report Status PENDING  Incomplete  Blood culture  (routine x 2)     Status: None (Preliminary result)   Collection Time: 12/28/22  1:29 AM   Specimen: BLOOD LEFT HAND  Result Value Ref Range Status   Specimen Description BLOOD LEFT HAND  Final   Special Requests   Final    BOTTLES DRAWN AEROBIC ONLY Blood Culture adequate volume   Culture  Setup Time   Final    GRAM POSITIVE RODS AEROBIC BOTTLE ONLY CRITICAL  RESULT CALLED TO, READ BACK BY AND VERIFIED WITH: V BRYK,PHARMD@[id number]  12/29/22 MK    Culture   Final    GRAM POSITIVE RODS IDENTIFICATION TO FOLLOW Performed at [facility], [address] [place], [place] [address]    Report Status PENDING  Incomplete  Aerobic/Anaerobic Culture w Gram Stain (surgical/deep wound)     Status: None (Preliminary result)   Collection Time: 12/29/22  9:51 AM   Specimen: Joint, Other; Body Fluid  Result Value Ref Range Status   Specimen Description KNEE  Final   Special Requests RT KNEE ABSCESS  Final   Gram Stain   Final    ABUNDANT WBC PRESENT, PREDOMINANTLY PMN NO ORGANISMS SEEN    Culture   Final    CULTURE REINCUBATED FOR BETTER GROWTH Performed at [facility], [address] [place], [place] [address]    Report Status PENDING  Incomplete  Aerobic/Anaerobic Culture w Gram Stain (surgical/deep wound)     Status: None (Preliminary result)   Collection Time: 12/29/22  9:58 AM   Specimen: Joint, Other; Body Fluid  Result Value Ref Range Status   Specimen Description KNEE  Final   Special Requests B RT KNEE ABSCESS  Final   Gram Stain   Final    ABUNDANT WBC PRESENT, PREDOMINANTLY PMN NO ORGANISMS SEEN    Culture   Final    CULTURE REINCUBATED FOR BETTER GROWTH Performed at [facility], [address], [place], [place] [address]    Report Status PENDING  Incomplete     Rexene Alberts, MSN, NP-C [facility]  [email or web address] Pager: [phone] Office: [phone] [facility] Main Line:  [phone] *Secure Chat Communication Welcome

## 2022-12-30 NOTE — Plan of Care (Signed)
  Problem: Education: Goal: Knowledge of disease or condition will improve Outcome: Progressing Goal: Understanding of medication regimen will improve Outcome: Progressing   Problem: Activity: Goal: Ability to tolerate increased activity will improve Outcome: Progressing   Problem: Clinical Measurements: Goal: Ability to maintain clinical measurements within normal limits will improve Outcome: Progressing Goal: Diagnostic test results will improve Outcome: Progressing   Problem: Coping: Goal: Level of anxiety will decrease Outcome: Progressing

## 2022-12-30 NOTE — Progress Notes (Signed)
Orthopaedic Trauma Progress Note  SUBJECTIVE: Notes significant 10/10 pain in the right knee.  Does not feel like pain medication is not helping.  Has not been up out of bed yet since surgery.  Denies any fever or chills.  Denies chest pain. No SOB. No nausea/vomiting. No other complaints.   Wound culture are still pending, reintubated for better growth.  Blood cultures showing gram-positive rods.  OBJECTIVE:  Vitals:   12/30/22 0315 12/30/22 0907  BP: 105/88 (!) 90/51  Pulse: 87   Resp: 15   Temp: 97.6 F (36.4 C)   SpO2: 93%     General: Resting in bed, no acute distress Respiratory: No increased work of breathing.  Right lower extremity:  + Joint effusion.  Incisional wound vac with small amount of drainage (25 mL). Pain with motion of the knee.  Ankle dorsiflexion/plantarflexion is intact but patient had some discomfort with this.  No significant increase in pain with passive stretch of the toes.  Global soreness/tenderness to the calf but no areas of significant increase in pain.  +EHL/FHL intact.  Neurovascularly intact.  IMAGING: Repeat imaging right knee performed 12/27/2022 shows stable appearance to plate and screw fixation.  Fracture appears stable.  No change in alignment.  No evidence of new fracture.  Moderate knee joint effusion.  LABS:  Results for orders placed or performed during the hospital encounter of 12/27/22 (from the past 24 hour(s))  Basic metabolic panel     Status: Abnormal   Collection Time: 12/30/22  4:44 AM  Result Value Ref Range   Sodium 131 (L) 135 - 145 mmol/L   Potassium 4.2 3.5 - 5.1 mmol/L   Chloride 100 98 - 111 mmol/L   CO2 24 22 - 32 mmol/L   Glucose, Bld 93 70 - 99 mg/dL   BUN 12 6 - 20 mg/dL   Creatinine, Ser 6.96 0.61 - 1.24 mg/dL   Calcium 8.6 (L) 8.9 - 10.3 mg/dL   GFR, Estimated >29 >52 mL/min   Anion gap 7 5 - 15  Magnesium     Status: Abnormal   Collection Time: 12/30/22  4:44 AM  Result Value Ref Range   Magnesium 1.6 (L) 1.7  - 2.4 mg/dL  Phosphorus     Status: None   Collection Time: 12/30/22  4:44 AM  Result Value Ref Range   Phosphorus 2.7 2.5 - 4.6 mg/dL  CBC     Status: Abnormal   Collection Time: 12/30/22  4:44 AM  Result Value Ref Range   WBC 7.4 4.0 - 10.5 K/uL   RBC 3.92 (L) 4.22 - 5.81 MIL/uL   Hemoglobin 12.0 (L) 13.0 - 17.0 g/dL   HCT 84.1 (L) 32.4 - 40.1 %   MCV 90.8 80.0 - 100.0 fL   MCH 30.6 26.0 - 34.0 pg   MCHC 33.7 30.0 - 36.0 g/dL   RDW 02.7 25.3 - 66.4 %   Platelets 371 150 - 400 K/uL   nRBC 0.0 0.0 - 0.2 %    ASSESSMENT: Jonathan Ibarra is a 57 y.o. male 1 day postop s/p IRRIGATION AND DEBRIDEMENT RIGHT KNEE   Previous ORIF of right tibial plateau fracture with repair of right lateral meniscus tear on 12/13/2022  PLAN: Weightbearing: NWB RLE ROM: Okay for range of motion of the knee as tolerated Incisional and dressing care: Reinforce dressing as needed Showering: Hold off on showering for now Orthopedic device(s): Incisional wound VAC RLE Pain management:  1. Tylenol 325 mg q 6 hours  PRN 2. Robaxin 750 mg q 6 hours PRN 3. Oxycodone 5-10 mg q 4 hours PRN 4. Toradol 30 mg every 6 hours x 3 days 5. Dilaudid 0.5-1 mg q 3 hours PRN VTE prophylaxis: Lovenox.  SCDs ID: Vancomycin + Zosyn. Will likely need to stop Zosyn today and switch to Ceftriaxone  Foley/Lines:  No foley, KVO IVFs Impediments to Fracture Healing:  Infection.  Vitamin D level pending, will start supplementation as indicated Dispo: PT/OT evaluation today.  Wound culture still pending.  Infectious disease (Dr. Thedore Mins) consulted, appreciate their assistance with antibiotic management.  Follow - up plan:  TBD   Contact information:  Truitt Merle MD, Thyra Breed PA-C. After hours and holidays please check [email or web address] for group call information for [facility]   Thompson Caul, PA-C [phone] (office) [email or web address]

## 2022-12-30 NOTE — TOC Progression Note (Signed)
Transition of Care [facility]) - Progression Note    Patient Details  Name: Jonathan Ibarra MRN: [id number] Date of Birth: 1965/03/31  [facility] CM/SW Contact  Gordy Clement, RN Phone Number: 12/30/2022, 3:27 PM  Clinical Narrative:     RNCM reviewing recs for patient HHPT and HHOT and a shower bench are recommended at this juncture- Patient will remain for several weeks until IVABX are completed (6 week course recommended by ID)   TOC to review recs again as patient progresses. [facility] is agreeable to reviewing at that point if home health is needed  [facility] will continue to follow patient for any additional discharge needs          Expected Discharge Plan and Services                                               Social Determinants of Health (SDOH) Interventions SDOH Screenings   Food Insecurity: Food Insecurity Present (12/28/2022)  Housing: High Risk (12/28/2022)  Transportation Needs: Unmet Transportation Needs (12/28/2022)  Utilities: Not At Risk (12/28/2022)  Financial Resource Strain: Medium Risk (01/07/2020)   Received from [facility], [facility]  Social Connections: Unknown (07/05/2021)   Received from [facility], [facility]  Stress: No Stress Concern Present (01/07/2020)   Received from [facility], [facility]  Tobacco Use: High Risk (12/29/2022)    Readmission Risk Interventions     No data to display

## 2022-12-30 NOTE — Progress Notes (Signed)
PROGRESS NOTE    Jonathan Ibarra  WGN:[id number] DOB: Nov 27, 1965 DOA: 12/27/2022 PCP: Candi Leash, MD   Chief Complaint  Patient presents with   Knee Pain   Fall    Brief Narrative:   Jonathan Ibarra is a 57 y.o. male with medical history significant of hepatic cirrhosis, hepatic encephalopathy, thrombocytopenia, essential hypertension, polysubstance abuse, and recent admission 12/12/2022 post trauma after falling 6 feet down a river bed requiring ORIF of right knee. He presents to [facility] ED with severe right knee pain after another fall, he reports couple falls recently as well, upon admission his urine drug screen was positive for multiple substances, patient had significant purulent drainage from right knee recent surgical wound, patient went to the OR 10/30, which was significant for purulent discharge, and concern for septic arthritis.     Assessment & Plan:   Principal Problem:   Septic arthritis of knee, right (HCC) Active Problems:   Atrial fibrillation with rapid ventricular response (HCC)   Essential hypertension   Knee effusion, right   Septic Arthritis of (R) Knee  Post op Infection of Tibial Plateau Fx s/p ORIF and meniscus tear repair (secondary to fall at home) -Patient presents with significant purulent discharge from his right knee, concern for septic arthritis -Status post I&D 10/30 by Dr. Jena Gauss, remains on wound VAC, -Intraoperative cultures growing Klebsiella pneumonia -Antibiotics management per ID, continue with IV Rocephin  Bacteremia -Blood cultures from 10/29 growing gram-positive rods, possible skin contaminant, continue to follow, meanwhile remains on IV vancomycin  A-fib with RVR -Heart rate uncontrolled on presentation, this is most likely in the setting of polysubstance abuse including cocaine, methamphetamine and sepsis -Heart rate better controlled on IV Cardizem continue with Cardizem drip till after surgery. -Continue with  metoprolol -So far no anticoagulation secondary to poor compliance, falls, polysubstance abuse, (as well his CHA2DS2-VASc score is 1) -H within normal limit -2D echo in August with preserved EF    Polysubstance abuse  UDS positive for methamphetamine, THC, and cocaine. Patient reports he has not used ETOH in years. - Counseled for cessation  Liver cirrhosis  Status post TIPS. Normal liver enzymes. Has history of hepatitis C - Continue home lactulose, lasix, spironolactone    History of Hepatitis C -Status post eradication.   Hypertension  -Continue home spironolactone, metoprolol, Lasix    Seizure disorder On any AED,   DVT prophylaxis: Will await recommendation by Ortho after surgery Code Status: Full code Family Communication: None at bedside Disposition:   Status is: Inpatient    Consultants:  Orhto   Subjective:  No significant events overnight, he reports pain in right knee area  Objective: Vitals:   12/29/22 2011 12/29/22 2300 12/30/22 0315 12/30/22 0907  BP: 116/86  105/88 (!) 90/51  Pulse: (!) 105  87   Resp: 12  15   Temp: 98.9 F (37.2 C) 97.9 F (36.6 C) 97.6 F (36.4 C)   TempSrc: Oral Oral Oral   SpO2: 92%  93%     Intake/Output Summary (Last 24 hours) at 12/30/2022 1305 Last data filed at 12/30/2022 0517 Gross per 24 hour  Intake --  Output 600 ml  Net -600 ml   There were no vitals filed for this visit.  Examination:  Awake Alert, Oriented X 3, No new F.N deficits, Normal affect Symmetrical Chest wall movement, Good air movement bilaterally, CTAB RRR,No Gallops,Rubs or new Murmurs, No Parasternal Heave +ve B.Sounds, Abd Soft, No tenderness, No rebound - guarding or  rigidity. No Cyanosis, Clubbing, right knee area bandaged postoperatively and connected to wound VAC.    Data Reviewed: I have personally reviewed following labs and imaging studies  CBC: Recent Labs  Lab 12/27/22 1657 12/29/22 0431 12/30/22 0444  WBC 10.2 7.4  7.4  NEUTROABS 7.5  --   --   HGB 13.3 11.7* 12.0*  HCT 40.7 34.7* 35.6*  MCV 92.5 91.1 90.8  PLT 401* 353 371    Basic Metabolic Panel: Recent Labs  Lab 12/27/22 1647 12/29/22 0431 12/30/22 0444  NA 132* 130* 131*  K 3.8 4.2 4.2  CL 103 102 100  CO2 25 24 24   GLUCOSE 109* 102* 93  BUN 7 14 12   CREATININE 0.75 0.77 0.78  CALCIUM 8.6* 8.4* 8.6*  MG  --   --  1.6*  PHOS  --   --  2.7    GFR: Estimated Creatinine Clearance: 140 mL/min (by C-G formula based on SCr of 0.78 mg/dL).  Liver Function Tests: Recent Labs  Lab 12/27/22 1647 12/29/22 0431  AST 23 20  ALT 14 12  ALKPHOS 114 103  BILITOT 2.2* 1.6*  PROT 7.2 6.2*  ALBUMIN 2.3* 2.0*    CBG: No results for input(s): "GLUCAP" in the last 168 hours.   Recent Results (from the past 240 hour(s))  Blood culture (routine x 2)     Status: None (Preliminary result)   Collection Time: 12/28/22  1:28 AM   Specimen: BLOOD RIGHT ARM  Result Value Ref Range Status   Specimen Description BLOOD RIGHT ARM  Final   Special Requests   Final    BOTTLES DRAWN AEROBIC AND ANAEROBIC Blood Culture adequate volume   Culture   Final    NO GROWTH 2 DAYS Performed at [facility] Lab, [address], [place], [place] [address]    Report Status PENDING  Incomplete  Blood culture (routine x 2)     Status: None (Preliminary result)   Collection Time: 12/28/22  1:29 AM   Specimen: BLOOD LEFT HAND  Result Value Ref Range Status   Specimen Description BLOOD LEFT HAND  Final   Special Requests   Final    BOTTLES DRAWN AEROBIC ONLY Blood Culture adequate volume   Culture  Setup Time   Final    GRAM POSITIVE RODS AEROBIC BOTTLE ONLY CRITICAL RESULT CALLED TO, READ BACK BY AND VERIFIED WITH: V BRYK,PHARMD@[id number]  12/29/22 MK    Culture   Final    GRAM POSITIVE RODS IDENTIFICATION TO FOLLOW Performed at [facility], [address] [place], [place] [address]    Report Status PENDING  Incomplete  Aerobic/Anaerobic Culture w  Gram Stain (surgical/deep wound)     Status: None (Preliminary result)   Collection Time: 12/29/22  9:51 AM   Specimen: Joint, Other; Body Fluid  Result Value Ref Range Status   Specimen Description KNEE  Final   Special Requests RT KNEE ABSCESS  Final   Gram Stain   Final    ABUNDANT WBC PRESENT, PREDOMINANTLY PMN NO ORGANISMS SEEN    Culture   Final    RARE KLEBSIELLA PNEUMONIAE CULTURE REINCUBATED FOR BETTER GROWTH Performed at [facility], [address] [place], [place] [address]    Report Status PENDING  Incomplete  Aerobic/Anaerobic Culture w Gram Stain (surgical/deep wound)     Status: None (Preliminary result)   Collection Time: 12/29/22  9:58 AM   Specimen: Joint, Other; Body Fluid  Result Value Ref Range Status  Specimen Description KNEE  Final   Special Requests B RT KNEE ABSCESS  Final   Gram Stain   Final    ABUNDANT WBC PRESENT, PREDOMINANTLY PMN NO ORGANISMS SEEN    Culture   Final    FEW KLEBSIELLA PNEUMONIAE SUSCEPTIBILITIES TO FOLLOW Performed at [facility], [address], [place], [place] [address]    Report Status PENDING  Incomplete         Radiology Studies: No results found.      Scheduled Meds:  enoxaparin (LOVENOX) injection  40 mg Subcutaneous Q24H   folic acid  1 mg Oral Daily   furosemide  40 mg Oral Daily   ketorolac  30 mg Intravenous Q6H   lactulose  20 g Oral BID   metoprolol tartrate  25 mg Oral BID   sodium chloride flush  3 mL Intravenous Q12H   spironolactone  25 mg Oral Daily   Tdap  0.5 mL Intramuscular Once   thiamine  100 mg Oral Daily   Continuous Infusions:  cefTRIAXone (ROCEPHIN)  IV     diltiazem (CARDIZEM) infusion Stopped (12/28/22 1445)   vancomycin 1,000 mg (12/30/22 0517)     LOS: 2 days     Huey Bienenstock, MD [facility]   To contact the attending provider between 7A-7P or the covering provider during after hours 7P-7A, please log into the web site [email or web address] and  access using universal [place] password for that web site. If you do not have the password, please call the hospital operator.  12/30/2022, 1:05 PM

## 2022-12-30 NOTE — Plan of Care (Signed)
  Problem: Education: Goal: Knowledge of disease or condition will improve Outcome: Progressing   Problem: Education: Goal: Knowledge of General Education information will improve Description: Including pain rating scale, medication(s)/side effects and non-pharmacologic comfort measures Outcome: Progressing   Problem: Health Behavior/Discharge Planning: Goal: Ability to manage health-related needs will improve Outcome: Progressing   Problem: Clinical Measurements: Goal: Will remain free from infection Outcome: Progressing

## 2022-12-30 NOTE — Anesthesia Postprocedure Evaluation (Signed)
Anesthesia Post Note  Patient: Nelton Nip  Procedure(s) Performed: IRRIGATION AND DEBRIDEMENT KNEE (Right: Knee)     Patient location during evaluation: PACU Anesthesia Type: General Level of consciousness: awake and alert Pain management: pain level controlled Vital Signs Assessment: post-procedure vital signs reviewed and stable Respiratory status: spontaneous breathing, nonlabored ventilation, respiratory function stable and patient connected to nasal cannula oxygen Cardiovascular status: blood pressure returned to baseline and stable Postop Assessment: no apparent nausea or vomiting Anesthetic complications: no   No notable events documented.  Last Vitals:  Vitals:   12/30/22 1600 12/30/22 1700  BP: 114/80 98/67  Pulse: 99 (!) 110  Resp:    Temp:    SpO2: 94% 95%    Last Pain:  Vitals:   12/30/22 1300  TempSrc: Oral  PainSc:                  Mariann Barter

## 2022-12-31 ENCOUNTER — Other Ambulatory Visit (HOSPITAL_COMMUNITY): Payer: Self-pay

## 2022-12-31 ENCOUNTER — Telehealth (HOSPITAL_COMMUNITY): Payer: Self-pay | Admitting: Pharmacy Technician

## 2022-12-31 DIAGNOSIS — R7881 Bacteremia: Secondary | ICD-10-CM

## 2022-12-31 DIAGNOSIS — Z1612 Extended spectrum beta lactamase (ESBL) resistance: Secondary | ICD-10-CM

## 2022-12-31 DIAGNOSIS — M00861 Arthritis due to other bacteria, right knee: Secondary | ICD-10-CM | POA: Diagnosis not present

## 2022-12-31 DIAGNOSIS — B961 Klebsiella pneumoniae [K. pneumoniae] as the cause of diseases classified elsewhere: Secondary | ICD-10-CM

## 2022-12-31 DIAGNOSIS — M009 Pyogenic arthritis, unspecified: Secondary | ICD-10-CM | POA: Diagnosis not present

## 2022-12-31 DIAGNOSIS — I482 Chronic atrial fibrillation, unspecified: Secondary | ICD-10-CM | POA: Diagnosis not present

## 2022-12-31 LAB — VITAMIN D 25 HYDROXY (VIT D DEFICIENCY, FRACTURES): Vit D, 25-Hydroxy: 28.57 ng/mL — ABNORMAL LOW (ref 30–100)

## 2022-12-31 LAB — CULTURE, BLOOD (ROUTINE X 2): Special Requests: ADEQUATE

## 2022-12-31 LAB — CBC
HCT: 32.1 % — ABNORMAL LOW (ref 39.0–52.0)
Hemoglobin: 10.6 g/dL — ABNORMAL LOW (ref 13.0–17.0)
MCH: 29.9 pg (ref 26.0–34.0)
MCHC: 33 g/dL (ref 30.0–36.0)
MCV: 90.7 fL (ref 80.0–100.0)
Platelets: 313 10*3/uL (ref 150–400)
RBC: 3.54 MIL/uL — ABNORMAL LOW (ref 4.22–5.81)
RDW: 14.5 % (ref 11.5–15.5)
WBC: 6.3 10*3/uL (ref 4.0–10.5)
nRBC: 0 % (ref 0.0–0.2)

## 2022-12-31 LAB — BASIC METABOLIC PANEL
Anion gap: 6 (ref 5–15)
BUN: 18 mg/dL (ref 6–20)
CO2: 27 mmol/L (ref 22–32)
Calcium: 8.4 mg/dL — ABNORMAL LOW (ref 8.9–10.3)
Chloride: 103 mmol/L (ref 98–111)
Creatinine, Ser: 0.82 mg/dL (ref 0.61–1.24)
GFR, Estimated: >60 mL/min (ref >60)
Glucose, Bld: 83 mg/dL (ref 70–99)
Potassium: 3.8 mmol/L (ref 3.5–5.1)
Sodium: 136 mmol/L (ref 135–145)

## 2022-12-31 MED ORDER — SODIUM CHLORIDE 0.9 % IV SOLN
1.0000 g | INTRAVENOUS | Status: DC
  Administered 2022-12-31 – 2023-02-04 (×36): 1 g via INTRAVENOUS
  Filled 2022-12-31 (×39): qty 1000

## 2022-12-31 MED ORDER — SODIUM CHLORIDE 0.9 % IV SOLN
1.0000 g | INTRAVENOUS | Status: DC
  Filled 2022-12-31: qty 1000

## 2022-12-31 MED ORDER — METOPROLOL TARTRATE 50 MG PO TABS
75.0000 mg | ORAL_TABLET | Freq: Two times a day (BID) | ORAL | Status: DC
  Administered 2023-01-01: 75 mg via ORAL
  Filled 2022-12-31 (×2): qty 1

## 2022-12-31 MED ORDER — VITAMIN D (ERGOCALCIFEROL) 1.25 MG (50000 UNIT) PO CAPS
50000.0000 [IU] | ORAL_CAPSULE | ORAL | Status: DC
  Administered 2022-12-31 – 2023-02-04 (×6): 50000 [IU] via ORAL
  Filled 2022-12-31 (×6): qty 1

## 2022-12-31 MED ORDER — HEPATITIS B VAC RECOMB ADJ 20 MCG/0.5ML IM SOSY
0.5000 mL | PREFILLED_SYRINGE | Freq: Once | INTRAMUSCULAR | Status: AC
  Administered 2023-01-01: 0.5 mL via INTRAMUSCULAR
  Filled 2022-12-31 (×2): qty 0.5

## 2022-12-31 MED ORDER — METOPROLOL TARTRATE 50 MG PO TABS: 50.0000 mg | ORAL_TABLET | ORAL | Status: AC

## 2022-12-31 NOTE — Progress Notes (Signed)
[facility]  Date of Admission:  12/27/2022      Total days of antibiotics            ASSESSMENT: Jonathan Ibarra is a 57 y.o. male admitted with knee pain and effusion.    Septic Arthritis of the Right Knee, Involved Hardware -  Post op Infection of Tibial Plateau Fx s/p ORIF and meniscus tear repair -  ESBL Klebsiella -  -Intra op cultures growing out ESBL kleb --> switch to IV ertapenem 1 gm once daily.  -Will send for susceptibilities to Omadacycline PO - otherwise it is resistant to bactrim and quinolones removing oral options for treatment. This will likely take at least a week.  -this will complicate what to do with the hardware.  -Will reach out to Dr. Randol Kern and Dr. Jena Gauss / Maralyn Sago.    Bacteremia -  -corynebacterium in 1/2 sets, not acutely ill presenting which makes this most c/w contaminant -stop vancomycin    H/O Hepatitis C s/p eradication -  Cirrhosis with h/o TIPS for decompensation -  -Viral RNA was not detected 2 weeks ago -would benefit from hepatitis b vaccine given no immunity - first dose ordered 11/2, 2nd in 4 weeks after 12/1  Disposition -  -unclear to me if he is housed or couch surfing at the moment.  -I have concerns about him being able to be successful and free from harm in the setting of outpatient IV abx. Will investigate more next week to see what options / resources he may have available for him.    PLAN: Stop vancomycin Switch ceftriaxone to ertapenem  Send out for omadacycline susceptibilities   Will FU next week. Please contact Dr. Thedore Mins over the w/e if any infectious disease problems come up.    Principal Problem:   Septic arthritis of knee, right (HCC) Active Problems:   Essential hypertension   Atrial fibrillation with rapid ventricular response (HCC)   Knee effusion, right    enoxaparin (LOVENOX) injection  40 mg Subcutaneous Q24H   folic acid  1 mg Oral Daily   furosemide  40 mg  Oral Daily   ketorolac  30 mg Intravenous Q6H   lactulose  20 g Oral BID   metoprolol tartrate  50 mg Oral NOW   metoprolol tartrate  75 mg Oral BID   sodium chloride flush  3 mL Intravenous Q12H   spironolactone  25 mg Oral Daily   Tdap  0.5 mL Intramuscular Once   thiamine  100 mg Oral Daily   Vitamin D (Ergocalciferol)  50,000 Units Oral Q7 days    SUBJECTIVE: No new concerns voiced today.   Review of Systems: Review of Systems  Constitutional:  Negative for chills and fever.  Respiratory: Negative.    Cardiovascular: Negative.     Allergies  Allergen Reactions   Tylenol [Acetaminophen] Other (See Comments)    Impacts liver    OBJECTIVE: Vitals:   12/30/22 2029 12/30/22 2300 12/31/22 0400 12/31/22 0700  BP: (!) 113/92 101/70 104/62 (!) 114/57  Pulse: (!) 114 66 69 65  Resp: 17 12 14    Temp:  98.3 F (36.8 C) (!) 97.4 F (36.3 C) 97.6 F (36.4 C)  TempSrc:  Oral Oral Oral  SpO2: 91% 92% 95% 90%   There is no height or weight on file to calculate BMI.  Physical Exam Vitals and nursing note reviewed.  Constitutional:      Appearance:  Normal appearance. He is not ill-appearing.  Eyes:     Pupils: Pupils are equal, round, and reactive to light.  Cardiovascular:     Rate and Rhythm: Normal rate and regular rhythm.  Pulmonary:     Effort: Pulmonary effort is normal.     Breath sounds: Normal breath sounds.  Abdominal:     General: Bowel sounds are normal. There is no distension.     Palpations: Abdomen is soft.  Musculoskeletal:     Comments: Vac and dressing c/d/I   Neurological:     Mental Status: He is oriented to person, place, and time.     Lab Results Lab Results  Component Value Date   WBC 6.3 12/31/2022   HGB 10.6 (L) 12/31/2022   HCT 32.1 (L) 12/31/2022   MCV 90.7 12/31/2022   PLT 313 12/31/2022    Lab Results  Component Value Date   CREATININE 0.82 12/31/2022   BUN 18 12/31/2022   NA 136 12/31/2022   K 3.8 12/31/2022   CL 103  12/31/2022   CO2 27 12/31/2022    Lab Results  Component Value Date   ALT 12 12/29/2022   AST 20 12/29/2022   ALKPHOS 103 12/29/2022   BILITOT 1.6 (H) 12/29/2022     Microbiology: Recent Results (from the past 240 hour(s))  Blood culture (routine x 2)     Status: None (Preliminary result)   Collection Time: 12/28/22  1:28 AM   Specimen: BLOOD RIGHT ARM  Result Value Ref Range Status   Specimen Description BLOOD RIGHT ARM  Final   Special Requests   Final    BOTTLES DRAWN AEROBIC AND ANAEROBIC Blood Culture adequate volume   Culture   Final    NO GROWTH 3 DAYS Performed at [facility], [address] [place], [place] [address]    Report Status PENDING  Incomplete  Blood culture (routine x 2)     Status: Abnormal   Collection Time: 12/28/22  1:29 AM   Specimen: BLOOD LEFT HAND  Result Value Ref Range Status   Specimen Description BLOOD LEFT HAND  Final   Special Requests   Final    BOTTLES DRAWN AEROBIC ONLY Blood Culture adequate volume   Culture  Setup Time   Final    GRAM POSITIVE RODS AEROBIC BOTTLE ONLY CRITICAL RESULT CALLED TO, READ BACK BY AND VERIFIED WITH: V BRYK,PHARMD@0410  12/29/22 MK    Culture (A)  Final    CORYNEBACTERIUM SPECIES Standardized susceptibility testing for this organism is not available. Performed at [facility] Lab, [address], [place], [place] [address]    Report Status 12/31/2022 FINAL  Final  Aerobic/Anaerobic Culture w Gram Stain (surgical/deep wound)     Status: None (Preliminary result)   Collection Time: 12/29/22  9:51 AM   Specimen: Joint, Other; Body Fluid  Result Value Ref Range Status   Specimen Description KNEE  Final   Special Requests RT KNEE ABSCESS  Final   Gram Stain   Final    ABUNDANT WBC PRESENT, PREDOMINANTLY PMN NO ORGANISMS SEEN Performed at [facility], [address], [place], [place] [address]    Culture   Final    RARE KLEBSIELLA PNEUMONIAE SUSCEPTIBILITIES PERFORMED ON PREVIOUS CULTURE  WITHIN THE LAST 5 DAYS. NO ANAEROBES ISOLATED; CULTURE IN PROGRESS FOR 5 DAYS    Report Status PENDING  Incomplete  Aerobic/Anaerobic Culture w Gram Stain (surgical/deep wound)     Status: None (Preliminary result)  Collection Time: 12/29/22  9:58 AM   Specimen: Joint, Other; Body Fluid  Result Value Ref Range Status   Specimen Description KNEE  Final   Special Requests B RT KNEE ABSCESS  Final   Gram Stain   Final    ABUNDANT WBC PRESENT, PREDOMINANTLY PMN NO ORGANISMS SEEN    Culture   Final    FEW KLEBSIELLA PNEUMONIAE Confirmed Extended Spectrum Beta-Lactamase Producer (ESBL).  In bloodstream infections from ESBL organisms, carbapenems are preferred over piperacillin/tazobactam. They are shown to have a lower risk of mortality. NO ANAEROBES ISOLATED; CULTURE IN PROGRESS FOR 5 DAYS Sent to [facility] for further susceptibility testing. Performed at [facility] Lab, [address], [place], [place] [address]    Report Status PENDING  Incomplete   Organism ID, Bacteria KLEBSIELLA PNEUMONIAE  Final      Susceptibility   Klebsiella pneumoniae - MIC*    AMPICILLIN >=32 RESISTANT Resistant     CEFEPIME 2 SENSITIVE Sensitive     CEFTAZIDIME RESISTANT Resistant     CEFTRIAXONE >=64 RESISTANT Resistant     CIPROFLOXACIN 0.5 INTERMEDIATE Intermediate     GENTAMICIN <=1 SENSITIVE Sensitive     IMIPENEM <=0.25 SENSITIVE Sensitive     TRIMETH/SULFA >=320 RESISTANT Resistant     AMPICILLIN/SULBACTAM 4 SENSITIVE Sensitive     PIP/TAZO <=4 SENSITIVE Sensitive ug/mL    * FEW KLEBSIELLA PNEUMONIAE  Culture, blood (Routine X 2) w Reflex to ID Panel     Status: None (Preliminary result)   Collection Time: 12/30/22  3:49 PM   Specimen: BLOOD RIGHT ARM  Result Value Ref Range Status   Specimen Description BLOOD RIGHT ARM  Final   Special Requests   Final    BOTTLES DRAWN AEROBIC ONLY Blood Culture results may not be optimal due to an inadequate volume of blood received in culture bottles    Culture   Final    NO GROWTH < 24 HOURS Performed at [facility] Lab, [address], [place], [place] [address]    Report Status PENDING  Incomplete  Culture, blood (Routine X 2) w Reflex to ID Panel     Status: None (Preliminary result)   Collection Time: 12/30/22  3:49 PM   Specimen: BLOOD RIGHT HAND  Result Value Ref Range Status   Specimen Description BLOOD RIGHT HAND  Final   Special Requests   Final    BOTTLES DRAWN AEROBIC ONLY Blood Culture adequate volume   Culture   Final    NO GROWTH < 24 HOURS Performed at [facility] Lab, [address], [place], [place] [address]    Report Status PENDING  Incomplete     Rexene Alberts, MSN, NP-C [facility]  [email or web address] Pager: [phone] Office: [phone] [facility] Main Line: [phone] *Secure Chat Communication Welcome

## 2022-12-31 NOTE — Progress Notes (Signed)
Physical Therapy Treatment Patient Details Name: Jonathan Ibarra MRN: [id number] DOB: 1965/10/03 Today's Date: 12/31/2022   History of Present Illness Pt is a 57 y/o male presenting 10/28 to [facility] with severe right knee pain and drainage after a fall the night PTA, he is now s/p R knee I&D on 12/29/22. He recently had a R tibial ORIF on 12/13/22 after a falls incidence. Jonathan Ibarra:Jonathan Ibarra abuse, [name], HTN, liver cirrhosis, [name], [name], and short distance gait in room with CGA<>min assist +2 with RW. Pt requires cuing for NWB & how to maintain NWB during mobility but only fair return demo. Pt without c/o symptoms until after gait when pt reports feeling a little lightheaded. Will continue to follow pt acutely to progress mobility as able.  Pt reports he has been residing with a friend Jonathan Ibarra) who works during the day.  Pt received on room air, [name], [name], [name];[name];Assistance with cooking/housework;Assist for transportation;[name], [name], [name], [name], Min assist, +2 safety/equipment, [name], [name], [name], During functional activity, Reliant on assistive device for balance Standing balance-Leahy Scale: Poor                              Cognition Arousal: Alert   Overall  Cognitive Status: Within Functional Limits for tasks assessed                                          Exercises      General Comments        Pertinent Vitals/Pain Pain Assessment Pain Assessment: 0-10 Pain Score: 10-Worst pain ever Pain Location: RLE Pain Descriptors / Indicators: Discomfort Pain Intervention(s): Monitored during session    Home Living                          Prior Function            PT Goals (current goals can now be found in the care plan section) Acute Rehab PT  Goals Patient Stated Goal: decrease pain and go home PT Goal Formulation: With patient Time For Goal Achievement: 01/12/23 Potential to Achieve Goals: Good Progress towards PT goals: Progressing toward goals    Frequency    Min 1X/week      PT Plan      Co-evaluation PT/OT/SLP Co-Evaluation/Treatment: Yes Reason for Co-Treatment:  (unsure of pt's activity tolerance 2/2 pain & low BP) PT goals addressed during session: Mobility/safety with mobility;Balance;[name] "6 Clicks" Mobility   Outcome Measure  Help needed turning from your back to your side while in a flat bed without using bedrails?: None Help needed moving from lying on your back to sitting on the side of a flat bed without using bedrails?: A Little Help needed moving to and from a bed to a chair (including a wheelchair)?: A Little Help needed standing up from a chair using your arms (e.g., wheelchair or bedside chair)?: A Little Help needed to walk in hospital room?: A Little Help needed climbing 3-5 steps with a railing? : Total 6 Click Score: 17    End of Session   Activity Tolerance: Patient tolerated treatment well Patient left: in chair;with chair alarm set;with call bell/phone within reach Nurse Communication: Mobility status (pt pulling IV out) PT Visit Diagnosis: Muscle weakness (generalized) (M62.81);Other abnormalities of gait and mobility (R26.89)     Time: 1447-1520 PT Time Calculation (min) (ACUTE ONLY): 33 min  Charges:    $Therapeutic Activity: 8-22 mins PT General Charges $$ ACUTE PT VISIT: 1 Visit                     Jonathan Ibarra, PT, DPT 12/31/22, 3:28 PM   Jonathan Ibarra 12/31/2022, 3:26 PM

## 2022-12-31 NOTE — Telephone Encounter (Signed)
Pharmacy Patient Advocate Encounter   Received notificatio that prior authorization for Nuzyra 150MG  tablets is required/requested.   Insurance verification completed.   The patient is insured through [facility] .   Per test claim: PA required; PA submitted to above mentioned insurance via CoverMyMeds Key/confirmation #/EOC [id number] Status is pending

## 2022-12-31 NOTE — Progress Notes (Signed)
Occupational Therapy Treatment Patient Details Name: Jonathan Ibarra MRN: [id number] DOB: [birth year] Today's Date: 12/31/2022   History of present illness Pt is a 57 y/o male presenting 10/28 to [facility] with severe right knee pain and drainage after a fall the night PTA, he is now s/p R knee I&D on 12/29/22. He recently had a R tibial ORIF on 12/13/22 after a falls incidence. YHC:WCBJSEG abuse, [name], HTN, liver cirrhosis, polysubstance abuse.   OT comments  Pt continues to report high levels of pain but is tolerating OT session, completing transfers and ambulation with Min A to CGA and bathing Mod I. Pt noted to have challenge with RLE NWB management, admits to grazing his RLE against the ground at home. Messaged MD to determine if pt was safe to progress to TTWB. He does seem to desat on RA but hard to assess due to poor pleth, obtained new finger probe but left pt on supplemental 02 with Sp02 last noted at 93%. Cues needed for pt to keep his supplemental 02 on. OT to continue to progress pt as able.       If plan is discharge home, recommend the following:  Assistance with cooking/housework;Help with stairs or ramp for entrance;Assist for transportation;[name];A little help with walking and/or transfers   Equipment Recommendations  Tub/shower bench    Recommendations for Other Services      Precautions / Restrictions Precautions Precautions: Fall Restrictions Weight Bearing Restrictions: Yes RLE Weight Bearing: Non weight bearing       Mobility Bed Mobility Overal bed mobility: Needs Assistance Bed Mobility: Supine to Sit     Supine to sit: Min assist, [name], Used rails (for RLE) Sit to supine: Min assist, [name]   General bed mobility comments: Min A for RLE management    Transfers Overall transfer level: Needs assistance Equipment used: Rolling walker (2 wheels) Transfers: Sit to/from Stand, Bed to  chair/wheelchair/BSC Sit to Stand: Contact guard assist   Squat pivot transfers: Min assist       General transfer comment: Education re: RLE placement to prevent weight bearing through extremity     Balance Overall balance assessment: Needs assistance Sitting-balance support: No upper extremity supported, [name], During functional activity, Reliant on assistive device for balance Standing balance-Leahy Scale: Poor                             ADL either performed or assessed with clinical judgement   ADL Overall ADL's : Needs assistance/impaired Eating/Feeding: Independent;Sitting   Grooming: Set up;Sitting   Upper Body Bathing: Modified independent;Sitting   Lower Body Bathing: Sitting/lateral leans;Modified independent   Upper Body Dressing : Sitting;Set up       Toilet Transfer: Squat-pivot;Contact guard assist;BSC/3in1 Statistician Details (indicate cue type and reason): simulated with recliner         Functional mobility during ADLs: Contact guard assist;+2 for safety/equipment;Rolling walker (2 wheels) (steps at bedside)      Extremity/Trunk Assessment Upper Extremity Assessment Upper Extremity Assessment: Overall WFL for tasks assessed            Vision       Perception     Praxis      Cognition Arousal: Alert Behavior During Therapy: WFL for tasks assessed/performed Overall Cognitive Status: Within Functional Limits for tasks assessed  Exercises      Shoulder Instructions       General Comments VSS, Sp02 >92% with supplemental 02.    Pertinent Vitals/ Pain       Pain Assessment Pain Assessment: 0-10 Pain Score: 10-Worst pain ever Pain Location: RLE Pain Descriptors / Indicators: Discomfort, Aching, Moaning Pain Intervention(s): Limited activity within  patient's tolerance, Repositioned, Monitored during session  Home Living                                          Prior Functioning/Environment              Frequency  Min 1X/week        Progress Toward Goals  OT Goals(current goals can now be found in the care plan section)  Progress towards OT goals: Progressing toward goals  Acute Rehab OT Goals Patient Stated Goal: To reduce pain OT Goal Formulation: With patient Time For Goal Achievement: 01/12/23 Potential to Achieve Goals: Good  Plan      Co-evaluation    PT/OT/SLP Co-Evaluation/Treatment: Yes Reason for Co-Treatment: To address functional/ADL transfers PT goals addressed during session: Mobility/safety with mobility;Balance;Proper use of DME OT goals addressed during session: ADL's and self-care      AM-PAC OT "6 Clicks" Daily Activity     Outcome Measure   Help from another person eating meals?: None Help from another person taking care of personal grooming?: A Little Help from another person toileting, which includes using toliet, bedpan, or urinal?: A Little Help from another person bathing (including washing, rinsing, drying)?: None Help from another person to put on and taking off regular upper body clothing?: A Little Help from another person to put on and taking off regular lower body clothing?: Total 6 Click Score: 18    End of Session Equipment Utilized During Treatment: Oxygen;Rolling walker (2 wheels)  OT Visit Diagnosis: Pain;Unsteadiness on feet (R26.81);Other abnormalities of gait and mobility (R26.89);History of falling (Z91.81) Pain - Right/Left: Right Pain - part of body: Knee   Activity Tolerance Patient tolerated treatment well   Patient Left in chair;with chair alarm set;with call bell/phone within reach   Nurse Communication Mobility status        Time: 1445-1520 OT Time Calculation (min): 35 min  Charges: OT General Charges $OT Visit: 1 Visit OT  Treatments $Self Care/Home Management : 8-22 mins  12/31/2022  AB, OTR/L  Acute Rehabilitation Services  Office: [phone]   Tristan Schroeder 12/31/2022, 4:04 PM

## 2022-12-31 NOTE — Progress Notes (Signed)
PROGRESS NOTE    Jonathan Ibarra  WUJ:[id number] DOB: 10-13-1965 DOA: 12/27/2022 PCP: Candi Leash, MD   Chief Complaint  Patient presents with   Knee Pain   Fall    Brief Narrative:   Jonathan Ibarra is a 57 y.o. male with medical history significant of hepatic cirrhosis, hepatic encephalopathy, thrombocytopenia, essential hypertension, polysubstance abuse, and recent admission 12/12/2022 post trauma after falling 6 feet down a river bed requiring ORIF of right knee. He presents to [facility] ED with severe right knee pain after another fall, he reports couple falls recently as well, upon admission his urine drug screen was positive for multiple substances, patient had significant purulent drainage from right knee recent surgical wound, patient went to the OR 10/30, which was significant for purulent discharge, and concern for septic arthritis.     Assessment & Plan:   Principal Problem:   Septic arthritis of knee, right (HCC) Active Problems:   Atrial fibrillation with rapid ventricular response (HCC)   Essential hypertension   Knee effusion, right   Septic Arthritis of (R) Knee  Post op Infection of Tibial Plateau Fx s/p ORIF and meniscus tear repair (secondary to fall at home) -Patient presents with significant purulent discharge from his right knee, concern for septic arthritis -Status post I&D 10/30 by Dr. Jena Gauss, remains on wound VAC, -Intraoperative cultures growing Klebsiella pneumonia -Diabetic management per ID, he is on Invanz  Bacteremia -Blood cultures from 10/29 growing gram-positive rods, possible skin contaminant, it is showing Corynebacterium species  A-fib with RVR -Heart rate uncontrolled on presentation, this is most likely in the setting of polysubstance abuse including cocaine, methamphetamine and sepsis -Heart rate better controlled on IV Cardizem continue with Cardizem drip till after surgery. -Continue with metoprolol -So far no  anticoagulation secondary to poor compliance, falls, polysubstance abuse, (as well his CHA2DS2-VASc score is 1) -TSH within normal limit -Remains on significant dose Cardizem drip, will increase his metoprolol to 75 mg p.o. twice daily hopefully can wean him off the drip. -2D echo in August with preserved EF  Vitamin D deficiency -Started on supplements   Polysubstance abuse  UDS positive for methamphetamine, THC, and cocaine. Patient reports he has not used ETOH in years. - Counseled for cessation  Liver cirrhosis  Status post TIPS. Normal liver enzymes. Has history of hepatitis C - Continue home lactulose, lasix, spironolactone    History of Hepatitis C -Status post eradication.   Hypertension  -Continue home spironolactone, metoprolol, Lasix, please see above discussion regarding increasing his metoprolol   Seizure disorder On any AED,   DVT prophylaxis: Will await recommendation by Ortho after surgery Code Status: Full code Family Communication: None at bedside Disposition:   Status is: Inpatient    Consultants:  [facility]   Subjective:  No significant events overnight, he reports pain in right knee area  Objective: Vitals:   12/30/22 2029 12/30/22 2300 12/31/22 0400 12/31/22 0700  BP: (!) 113/92 101/70 104/62 (!) 114/57  Pulse: (!) 114 66 69 65  Resp: 17 12 14    Temp:  98.3 F (36.8 C) (!) 97.4 F (36.3 C) 97.6 F (36.4 C)  TempSrc:  Oral Oral Oral  SpO2: 91% 92% 95% 90%    Intake/Output Summary (Last 24 hours) at 12/31/2022 1133 Last data filed at 12/31/2022 0522 Gross per 24 hour  Intake 240 ml  Output 250 ml  Net -10 ml   There were no vitals filed for this visit.  Examination:  Awake Alert,  Oriented X 3, No new F.N deficits, Normal affect Symmetrical Chest wall movement, Good air movement bilaterally, CTAB Irregular, no Gallops,Rubs or new Murmurs, No Parasternal Heave +ve B.Sounds, Abd Soft, No tenderness, No rebound - guarding or  rigidity. No Cyanosis, Clubbing, right knee area bandaged postoperatively and connected to wound VAC.    Data Reviewed: I have personally reviewed following labs and imaging studies  CBC: Recent Labs  Lab 12/27/22 1657 12/29/22 0431 12/30/22 0444 12/31/22 0320  WBC 10.2 7.4 7.4 6.3  NEUTROABS 7.5  --   --   --   HGB 13.3 11.7* 12.0* 10.6*  HCT 40.7 34.7* 35.6* 32.1*  MCV 92.5 91.1 90.8 90.7  PLT 401* 353 371 313    Basic Metabolic Panel: Recent Labs  Lab 12/27/22 1647 12/29/22 0431 12/30/22 0444 12/31/22 0320  NA 132* 130* 131* 136  K 3.8 4.2 4.2 3.8  CL 103 102 100 103  CO2 25 24 24 27   GLUCOSE 109* 102* 93 83  BUN 7 14 12 18   CREATININE 0.75 0.77 0.78 0.82  CALCIUM 8.6* 8.4* 8.6* 8.4*  MG  --   --  1.6*  --   PHOS  --   --  2.7  --     GFR: CrCl cannot be calculated (Unknown ideal weight.).  Liver Function Tests: Recent Labs  Lab 12/27/22 1647 12/29/22 0431  AST 23 20  ALT 14 12  ALKPHOS 114 103  BILITOT 2.2* 1.6*  PROT 7.2 6.2*  ALBUMIN 2.3* 2.0*    CBG: No results for input(s): "GLUCAP" in the last 168 hours.   Recent Results (from the past 240 hour(s))  Blood culture (routine x 2)     Status: None (Preliminary result)   Collection Time: 12/28/22  1:28 AM   Specimen: BLOOD RIGHT ARM  Result Value Ref Range Status   Specimen Description BLOOD RIGHT ARM  Final   Special Requests   Final    BOTTLES DRAWN AEROBIC AND ANAEROBIC Blood Culture adequate volume   Culture   Final    NO GROWTH 3 DAYS Performed at [facility], [address] [place], [place] [address]    Report Status PENDING  Incomplete  Blood culture (routine x 2)     Status: Abnormal   Collection Time: 12/28/22  1:29 AM   Specimen: BLOOD LEFT HAND  Result Value Ref Range Status   Specimen Description BLOOD LEFT HAND  Final   Special Requests   Final    BOTTLES DRAWN AEROBIC ONLY Blood Culture adequate volume   Culture  Setup Time   Final    GRAM POSITIVE  RODS AEROBIC BOTTLE ONLY CRITICAL RESULT CALLED TO, READ BACK BY AND VERIFIED WITH: V BRYK,PHARMD@[id number]  12/29/22 MK    Culture (A)  Final    CORYNEBACTERIUM SPECIES Standardized susceptibility testing for this organism is not available. Performed at [facility], [address] [place], [place] [address]    Report Status 12/31/2022 FINAL  Final  Aerobic/Anaerobic Culture w Gram Stain (surgical/deep wound)     Status: None (Preliminary result)   Collection Time: 12/29/22  9:51 AM   Specimen: Joint, Other; Body Fluid  Result Value Ref Range Status   Specimen Description KNEE  Final   Special Requests RT KNEE ABSCESS  Final   Gram Stain   Final    ABUNDANT WBC PRESENT, PREDOMINANTLY PMN NO ORGANISMS SEEN    Culture   Final    RARE KLEBSIELLA PNEUMONIAE SUSCEPTIBILITIES PERFORMED  ON PREVIOUS CULTURE WITHIN THE LAST 5 DAYS. Performed at [facility], [address], [place], [place] [address]    Report Status PENDING  Incomplete  Aerobic/Anaerobic Culture w Gram Stain (surgical/deep wound)     Status: None (Preliminary result)   Collection Time: 12/29/22  9:58 AM   Specimen: Joint, Other; Body Fluid  Result Value Ref Range Status   Specimen Description KNEE  Final   Special Requests B RT KNEE ABSCESS  Final   Gram Stain   Final    ABUNDANT WBC PRESENT, PREDOMINANTLY PMN NO ORGANISMS SEEN    Culture   Final    FEW KLEBSIELLA PNEUMONIAE Confirmed Extended Spectrum Beta-Lactamase Producer (ESBL).  In bloodstream infections from ESBL organisms, carbapenems are preferred over piperacillin/tazobactam. They are shown to have a lower risk of mortality. NO ANAEROBES ISOLATED; CULTURE IN PROGRESS FOR 5 DAYS Sent to [facility] for further susceptibility testing. Performed at [facility], [address] [place], [place] [address]    Report Status PENDING  Incomplete   Organism ID, Bacteria KLEBSIELLA PNEUMONIAE  Final      Susceptibility   Klebsiella pneumoniae - MIC*     AMPICILLIN >=32 RESISTANT Resistant     CEFEPIME 2 SENSITIVE Sensitive     CEFTAZIDIME RESISTANT Resistant     CEFTRIAXONE >=64 RESISTANT Resistant     CIPROFLOXACIN 0.5 INTERMEDIATE Intermediate     GENTAMICIN <=1 SENSITIVE Sensitive     IMIPENEM <=0.25 SENSITIVE Sensitive     TRIMETH/SULFA >=320 RESISTANT Resistant     AMPICILLIN/SULBACTAM 4 SENSITIVE Sensitive     PIP/TAZO <=4 SENSITIVE Sensitive ug/mL    * FEW KLEBSIELLA PNEUMONIAE  Culture, blood (Routine X 2) w Reflex to ID Panel     Status: None (Preliminary result)   Collection Time: 12/30/22  3:49 PM   Specimen: BLOOD RIGHT ARM  Result Value Ref Range Status   Specimen Description BLOOD RIGHT ARM  Final   Special Requests   Final    BOTTLES DRAWN AEROBIC ONLY Blood Culture results may not be optimal due to an inadequate volume of blood received in culture bottles   Culture   Final    NO GROWTH < 24 HOURS Performed at [facility], [address] [place], [place] [address]    Report Status PENDING  Incomplete  Culture, blood (Routine X 2) w Reflex to ID Panel     Status: None (Preliminary result)   Collection Time: 12/30/22  3:49 PM   Specimen: BLOOD RIGHT HAND  Result Value Ref Range Status   Specimen Description BLOOD RIGHT HAND  Final   Special Requests   Final    BOTTLES DRAWN AEROBIC ONLY Blood Culture adequate volume   Culture   Final    NO GROWTH < 24 HOURS Performed at [facility], [address], [place], [place] [address]    Report Status PENDING  Incomplete         Radiology Studies: No results found.      Scheduled Meds:  enoxaparin (LOVENOX) injection  40 mg Subcutaneous Q24H   folic acid  1 mg Oral Daily   furosemide  40 mg Oral Daily   ketorolac  30 mg Intravenous Q6H   lactulose  20 g Oral BID   metoprolol tartrate  25 mg Oral BID   sodium chloride flush  3 mL Intravenous Q12H   spironolactone  25 mg Oral Daily   Tdap  0.5 mL Intramuscular Once  thiamine  100 mg Oral  Daily   Continuous Infusions:  diltiazem (CARDIZEM) infusion 10 mg/hr (12/31/22 0918)   ertapenem       LOS: 3 days     Huey Bienenstock, MD [facility]   To contact the attending provider between 7A-7P or the covering provider during after hours 7P-7A, please log into the web site [email or web address] and access using universal [place] password for that web site. If you do not have the password, please call the hospital operator.  12/31/2022, 11:33 AM

## 2022-12-31 NOTE — Telephone Encounter (Signed)
Pharmacy Patient Advocate Encounter  Received notification from [facility] that Prior Authorization for Nuzyra 150MG  tablets has been APPROVED from 12/31/2022 to 12/31/2023. Ran test claim, Copay is $4.00. This test claim was processed through [facility]- copay amounts may vary at other pharmacies due to pharmacy/plan contracts, or as the patient moves through the different stages of their insurance plan.   PA #/Case ID/Reference #: [id number]

## 2022-12-31 NOTE — Progress Notes (Signed)
Orthopaedic Trauma Progress Note  SUBJECTIVE: Notes pain is marginally better after addition of Toradol and Dilaudid yesterday. Nursing reports leakage around incisional wound vac on knee overnight. The wound vac machine was not plugged in and therefore the battery died and was no longer suctioning. I have plugged unit into the wall outlet this morning and suction working great. Additional 75 mL immediately evacuated ito suction canister.  Denies any fever or chills.  Denies chest pain. No SOB. No nausea/vomiting. No other complaints.   Wound culture growing klebsiella pneumoniae, susceptibilities to follow.   Blood cultures showing gram-positive rods.  OBJECTIVE:  Vitals:   12/30/22 2300 12/31/22 0400  BP: 101/70 104/62  Pulse: 66 69  Resp: 12 14  Temp: 98.3 F (36.8 C) (!) 97.4 F (36.3 C)  SpO2: 92% 95%    General: Resting in bed, no acute distress Respiratory: No increased work of breathing.  Right lower extremity:  Swelling about the knee stable. Ace wrap and incisional wound vac dressing with drainage around sponge due to oversaturation. Once plugged into the wall outlet, 100 mL noted in wound vac canister. Pain with motion of the knee. Knee kerlix and ace wrap dressing applied.  Ankle dorsiflexion/plantarflexion is intact but patient had some discomfort with this.  No significant increase in pain with passive stretch of the toes.  Global soreness/tenderness to the calf but no areas of significant increase in pain.  +EHL/FHL intact.  Neurovascularly intact.  IMAGING: Repeat imaging right knee performed 12/27/2022 shows stable appearance to plate and screw fixation.  Fracture appears stable.  No change in alignment.  No evidence of new fracture.  Moderate knee joint effusion.  LABS:  Results for orders placed or performed during the hospital encounter of 12/27/22 (from the past 24 hour(s))  CBC     Status: Abnormal   Collection Time: 12/31/22  3:20 AM  Result Value Ref Range   WBC  6.3 4.0 - 10.5 K/uL   RBC 3.54 (L) 4.22 - 5.81 MIL/uL   Hemoglobin 10.6 (L) 13.0 - 17.0 g/dL   HCT 16.1 (L) 09.6 - 04.5 %   MCV 90.7 80.0 - 100.0 fL   MCH 29.9 26.0 - 34.0 pg   MCHC 33.0 30.0 - 36.0 g/dL   RDW 40.9 81.1 - 91.4 %   Platelets 313 150 - 400 K/uL   nRBC 0.0 0.0 - 0.2 %  Basic metabolic panel     Status: Abnormal   Collection Time: 12/31/22  3:20 AM  Result Value Ref Range   Sodium 136 135 - 145 mmol/L   Potassium 3.8 3.5 - 5.1 mmol/L   Chloride 103 98 - 111 mmol/L   CO2 27 22 - 32 mmol/L   Glucose, Bld 83 70 - 99 mg/dL   BUN 18 6 - 20 mg/dL   Creatinine, Ser 7.82 0.61 - 1.24 mg/dL   Calcium 8.4 (L) 8.9 - 10.3 mg/dL   GFR, Estimated >95 >62 mL/min   Anion gap 6 5 - 15    ASSESSMENT: Jonathan Ibarra is a 57 y.o. male 1 day postop s/p IRRIGATION AND DEBRIDEMENT RIGHT KNEE   Previous ORIF of right tibial plateau fracture with repair of right lateral meniscus tear on 12/13/2022  PLAN: Weightbearing: NWB RLE ROM: Okay for range of motion of the knee as tolerated Incisional and dressing care: Reinforce dressing as needed Showering: Hold off on showering for now Orthopedic device(s): Incisional wound VAC RLE Pain management:  1. Tylenol 325 mg q 6  hours PRN 2. Robaxin 750 mg q 6 hours PRN 3. Oxycodone 5-10 mg q 4 hours PRN 4. Toradol 30 mg every 6 hours x 3 days 5. Dilaudid 0.5-1 mg q 3 hours PRN VTE prophylaxis: Lovenox.  SCDs ID: Vancomycin + Ceftriaxone  Foley/Lines:  No foley, KVO IVFs Impediments to Fracture Healing:  Infection.  Vitamin D level pending, will start supplementation as indicated Dispo: PT/OT evaluation ongoing. Plan to remove incisional vac tomorrow if no significant increase in drainage over next 24 hours. Infectious disease following, appreciate their assistance with antibiotic management.  Follow - up plan: 2 weeks after d/c    Contact information:  Truitt Merle MD, Thyra Breed PA-C. After hours and holidays please check [email or web address] for  group call information for [facility]   Thompson Caul, PA-C [phone] (office) [email or web address]

## 2022-12-31 NOTE — TOC Benefit Eligibility Note (Signed)
Patient Product/process development scientist completed.    The patient is insured through [employer].     Ran test claim for Nuzyra 150 mg and Requires Prior Authorization   This test claim was processed through [employer]- copay amounts may vary at other pharmacies due to [employer], or as the patient moves through the different stages of their insurance plan.     Roland Earl, CPHT Pharmacy Technician III Certified Patient Advocate [facility] [place] Pharmacy Patient Advocate Team Direct Number: [phone]  Fax: [phone]

## 2022-12-31 NOTE — Plan of Care (Signed)
  Problem: Education: Goal: Knowledge of disease or condition will improve Outcome: Progressing Goal: Understanding of medication regimen will improve Outcome: Progressing Goal: Individualized Educational Video(s) Outcome: Progressing   Problem: Activity: Goal: Ability to tolerate increased activity will improve Outcome: Progressing   Problem: Cardiac: Goal: Ability to achieve and maintain adequate cardiopulmonary perfusion will improve Outcome: Progressing   Problem: Health Behavior/Discharge Planning: Goal: Ability to safely manage health-related needs after discharge will improve Outcome: Progressing   Problem: Education: Goal: Knowledge of General Education information will improve Description: Including pain rating scale, medication(s)/side effects and non-pharmacologic comfort measures Outcome: Progressing   Problem: Health Behavior/Discharge Planning: Goal: Ability to manage health-related needs will improve Outcome: Progressing   Problem: Clinical Measurements: Goal: Ability to maintain clinical measurements within normal limits will improve Outcome: Progressing Goal: Will remain free from infection Outcome: Progressing Goal: Diagnostic test results will improve Outcome: Progressing Goal: Respiratory complications will improve Outcome: Progressing Goal: Cardiovascular complication will be avoided Outcome: Progressing   Problem: Activity: Goal: Risk for activity intolerance will decrease Outcome: Progressing   Problem: Nutrition: Goal: Adequate nutrition will be maintained Outcome: Progressing   Problem: Coping: Goal: Level of anxiety will decrease Outcome: Progressing   Problem: Elimination: Goal: Will not experience complications related to bowel motility Outcome: Progressing Goal: Will not experience complications related to urinary retention Outcome: Progressing   Problem: Pain Management: Goal: General experience of comfort will  improve Outcome: Progressing   Problem: Safety: Goal: Ability to remain free from injury will improve Outcome: Progressing   Problem: Skin Integrity: Goal: Risk for impaired skin integrity will decrease Outcome: Progressing

## 2022-12-31 NOTE — Plan of Care (Signed)
PT goals as late entry.  Jonathan Ibarra, PT, DPT 12/31/22, 3:29 PM   Problem: Acute Rehab PT Goals(only PT should resolve) Goal: Pt Will Transfer Bed To Chair/Chair To Bed Flowsheets (Taken 12/31/2022 1529) Pt will Transfer Bed to Chair/Chair to Bed: with modified independence Note: With LRAD Goal: Pt Will Ambulate Flowsheets (Taken 12/31/2022 1529) Pt will Ambulate:  25 feet  with modified independence  with least restrictive assistive device   Problem: Acute Rehab PT Goals(only PT should resolve) Goal: Pt Will Go Supine/Side To Sit Flowsheets (Taken 12/31/2022 1529) Pt will go Supine/Side to Sit: Independently Goal: Pt Will Go Sit To Supine/Side Flowsheets (Taken 12/31/2022 1529) Pt will go Sit to Supine/Side: Independently

## 2023-01-01 DIAGNOSIS — I4891 Unspecified atrial fibrillation: Secondary | ICD-10-CM | POA: Diagnosis not present

## 2023-01-01 DIAGNOSIS — M009 Pyogenic arthritis, unspecified: Secondary | ICD-10-CM | POA: Diagnosis not present

## 2023-01-01 LAB — BASIC METABOLIC PANEL
Anion gap: 5 (ref 5–15)
BUN: 17 mg/dL (ref 6–20)
CO2: 28 mmol/L (ref 22–32)
Calcium: 8.5 mg/dL — ABNORMAL LOW (ref 8.9–10.3)
Chloride: 101 mmol/L (ref 98–111)
Creatinine, Ser: 0.86 mg/dL (ref 0.61–1.24)
GFR, Estimated: >60 mL/min (ref >60)
Glucose, Bld: 125 mg/dL — ABNORMAL HIGH (ref 70–99)
Potassium: 3.9 mmol/L (ref 3.5–5.1)
Sodium: 134 mmol/L — ABNORMAL LOW (ref 135–145)

## 2023-01-01 LAB — MAGNESIUM: Magnesium: 1.8 mg/dL (ref 1.7–2.4)

## 2023-01-01 MED ORDER — METOPROLOL TARTRATE 25 MG PO TABS
25.0000 mg | ORAL_TABLET | Freq: Two times a day (BID) | ORAL | Status: DC
  Administered 2023-01-02: 25 mg via ORAL
  Filled 2023-01-01: qty 1

## 2023-01-01 MED ORDER — VITAMIN D 25 MCG (1000 UNIT) PO TABS
1000.0000 [IU] | ORAL_TABLET | Freq: Every day | ORAL | Status: DC
  Administered 2023-01-01 – 2023-01-23 (×22): 1000 [IU] via ORAL
  Filled 2023-01-01 (×23): qty 1

## 2023-01-01 NOTE — Plan of Care (Signed)
  Problem: Education: Goal: Knowledge of disease or condition will improve Outcome: Progressing Goal: Understanding of medication regimen will improve Outcome: Progressing Goal: Individualized Educational Video(s) Outcome: Progressing   Problem: Cardiac: Goal: Ability to achieve and maintain adequate cardiopulmonary perfusion will improve Outcome: Progressing   Problem: Health Behavior/Discharge Planning: Goal: Ability to safely manage health-related needs after discharge will improve Outcome: Progressing   Problem: Education: Goal: Knowledge of General Education information will improve Description: Including pain rating scale, medication(s)/side effects and non-pharmacologic comfort measures Outcome: Progressing   Problem: Health Behavior/Discharge Planning: Goal: Ability to manage health-related needs will improve Outcome: Progressing   Problem: Clinical Measurements: Goal: Ability to maintain clinical measurements within normal limits will improve Outcome: Progressing Goal: Will remain free from infection Outcome: Progressing

## 2023-01-01 NOTE — Plan of Care (Signed)
  Problem: Activity: Goal: Ability to tolerate increased activity will improve Outcome: Progressing   Problem: Education: Goal: Knowledge of General Education information will improve Description: Including pain rating scale, medication(s)/side effects and non-pharmacologic comfort measures Outcome: Progressing   Problem: Clinical Measurements: Goal: Ability to maintain clinical measurements within normal limits will improve Outcome: Progressing   Problem: Pain Management: Goal: General experience of comfort will improve Outcome: Progressing   Problem: Safety: Goal: Ability to remain free from injury will improve Outcome: Progressing   Problem: Skin Integrity: Goal: Risk for impaired skin integrity will decrease Outcome: Progressing

## 2023-01-01 NOTE — Progress Notes (Signed)
Orthopaedic Trauma Progress Note  SUBJECTIVE: Resting comfortably in bed this morning.  No acute events overnight.  Was able to get out of bed with therapies yesterday, states this went well.  No other specific concerns or complaints this morning.  Wound culture growing klebsiella pneumoniae, susceptibilities to follow.   Blood cultures from 12/28/2022 showing Corynebacterium   OBJECTIVE:  Vitals:   01/01/23 0344 01/01/23 0836  BP: 111/65   Pulse: 81   Resp: 20 16  Temp: 98.1 F (36.7 C)   SpO2: 99%     General: Resting in bed, no acute distress Respiratory: No increased work of breathing.  Right lower extremity:  Swelling about the knee stable. Ace wrap removed per patient's request.  Incisional wound VAC with good seal and function.  250 mL noted in wound VAC canister. Ankle dorsiflexion/plantarflexion is intact but patient had some discomfort with this.  No significant increase in pain with passive stretch of the toes.  Global soreness/tenderness to the calf but no areas of significant increase in pain.  +EHL/FHL intact.  Neurovascularly intact.  IMAGING: Repeat imaging right knee performed 12/27/2022 shows stable appearance to plate and screw fixation.  Fracture appears stable.  No change in alignment.  No evidence of new fracture.  Moderate knee joint effusion.  LABS:  Results for orders placed or performed during the hospital encounter of 12/27/22 (from the past 24 hour(s))  Basic metabolic panel     Status: Abnormal   Collection Time: 01/01/23  4:49 AM  Result Value Ref Range   Sodium 134 (L) 135 - 145 mmol/L   Potassium 3.9 3.5 - 5.1 mmol/L   Chloride 101 98 - 111 mmol/L   CO2 28 22 - 32 mmol/L   Glucose, Bld 125 (H) 70 - 99 mg/dL   BUN 17 6 - 20 mg/dL   Creatinine, Ser 1.61 0.61 - 1.24 mg/dL   Calcium 8.5 (L) 8.9 - 10.3 mg/dL   GFR, Estimated >09 >60 mL/min   Anion gap 5 5 - 15  Magnesium     Status: None   Collection Time: 01/01/23  4:49 AM  Result Value Ref Range    Magnesium 1.8 1.7 - 2.4 mg/dL    ASSESSMENT: Jonathan Ibarra is a 57 y.o. male 1 day postop s/p IRRIGATION AND DEBRIDEMENT RIGHT KNEE   Previous ORIF of right tibial plateau fracture with repair of right lateral meniscus tear on 12/13/2022  PLAN: Weightbearing: NWB RLE ROM: Okay for range of motion of the knee as tolerated Incisional and dressing care: Reinforce dressing as needed Showering: Hold off on showering for now Orthopedic device(s): Incisional wound VAC RLE Pain management:  1. Tylenol 325 mg q 6 hours PRN 2. Robaxin 750 mg q 6 hours PRN 3. Oxycodone 5-10 mg q 4 hours PRN 4. Toradol 30 mg every 6 hours x 3 days 5. Dilaudid 0.5-1 mg q 3 hours PRN VTE prophylaxis: Lovenox.  SCDs ID: Per infectious disease Foley/Lines:  No foley, KVO IVFs Impediments to Fracture Healing:  Infection.  Vitamin D level 28, start supplementation Dispo: PT/OT evaluation ongoing.  Incisional VAC with additional 150 mL of drainage over the last 24 hours.  Continue wound VAC for another 24 to 48 hours.  Infectious disease following, appreciate their assistance with antibiotic management.  Follow - up plan: 2 weeks after d/c    Contact information:  Truitt Merle MD, Thyra Breed PA-C. After hours and holidays please check [email or web address] for group call information for [facility]  Thompson Caul, PA-C [phone] (office) [email or web address]

## 2023-01-01 NOTE — Progress Notes (Signed)
PROGRESS NOTE    Jonathan Ibarra  ZOX:[id number] DOB: August 30, 1965 DOA: 12/27/2022 PCP: Candi Leash, MD   Chief Complaint  Patient presents with   Knee Pain   Fall    Brief Narrative:   Jonathan Ibarra is a 57 y.o. male with medical history significant of hepatic cirrhosis, hepatic encephalopathy, thrombocytopenia, essential hypertension, polysubstance abuse, and recent admission 12/12/2022 post trauma after falling 6 feet down a river bed requiring ORIF of right knee. He presents to [facility] ED with severe right knee pain after another fall, he reports couple falls recently as well, upon admission his urine drug screen was positive for multiple substances, patient had significant purulent drainage from right knee recent surgical wound, patient went to the OR 10/30, which was significant for purulent discharge, and concern for septic arthritis.     Assessment & Plan:   Principal Problem:   Septic arthritis of knee, right (HCC) Active Problems:   Atrial fibrillation with rapid ventricular response (HCC)   Essential hypertension   Knee effusion, right   Septic Arthritis of (R) Knee  Post op Infection of Tibial Plateau Fx s/p ORIF and meniscus tear repair (secondary to fall at home) -Patient presents with significant purulent discharge from his right knee, concern for septic arthritis -Status post I&D 10/30 by Dr. Jena Gauss, remains on wound VAC, 150 cc drainage over the last 24 hours, continue with wound VAC for another 24 to 48 hours -Intraoperative cultures growing Klebsiella pneumonia -Antibiotics management per ID, he is on Invanz  Bacteremia -Blood cultures from 10/29 growing gram-positive rods, it is showing Corynebacterium species, this is contamination, IV vancomycin discontinued  A-fib with RVR -Heart rate uncontrolled on presentation, this is most likely in the setting of polysubstance abuse including cocaine, methamphetamine and sepsis -So far no  anticoagulation secondary to poor compliance, falls, polysubstance abuse, (as well his CHA2DS2-VASc score is 1) -TSH within normal limit -He remains on IV Cardizem drip, metoprolol has been increased to 75 mg p.o. twice daily, will try to taper and wean off Cardizem today, if unsuccessful will increase his metoprolol.   -2D echo in August with preserved EF  Vitamin D deficiency -Started on supplements   Polysubstance abuse  UDS positive for methamphetamine, THC, and cocaine. Patient reports he has not used ETOH in years. - Counseled for cessation  Liver cirrhosis  Status post TIPS. Normal liver enzymes. Has history of hepatitis C - Continue home lactulose, lasix, spironolactone    History of Hepatitis C -Status post eradication.   Hypertension  -Continue home spironolactone, metoprolol, Lasix, please see above discussion regarding increasing his metoprolol   Seizure disorder Not On any AED,   DVT prophylaxis: lovenox Code Status: Full code Family Communication: None at bedside Disposition:   Status is: Inpatient    Consultants:  [facility]   Subjective:  No significant events overnight, he reports pain in right knee area  Objective: Vitals:   12/31/22 2300 12/31/22 2349 01/01/23 0344 01/01/23 0836  BP: (!) 110/94  111/65   Pulse: 80  81   Resp: 10 (!) 23 20 16   Temp: 97.6 F (36.4 C)  98.1 F (36.7 C)   TempSrc: Oral  Oral   SpO2: 92%  99%     Intake/Output Summary (Last 24 hours) at 01/01/2023 1134 Last data filed at 01/01/2023 8119 Gross per 24 hour  Intake 726 ml  Output 500 ml  Net 226 ml   There were no vitals filed for this visit.  Examination:  Awake Alert, Oriented X 3, No new F.N deficits, Normal affect Symmetrical Chest wall movement, Good air movement bilaterally, CTAB RRR,No Gallops,Rubs or new Murmurs, No Parasternal Heave +ve B.Sounds, Abd Soft, No tenderness, No rebound - guarding or rigidity. No Cyanosis, Clubbing ,right knee  area negative to wound VAC.   Data Reviewed: I have personally reviewed following labs and imaging studies  CBC: Recent Labs  Lab 12/27/22 1657 12/29/22 0431 12/30/22 0444 12/31/22 0320  WBC 10.2 7.4 7.4 6.3  NEUTROABS 7.5  --   --   --   HGB 13.3 11.7* 12.0* 10.6*  HCT 40.7 34.7* 35.6* 32.1*  MCV 92.5 91.1 90.8 90.7  PLT 401* 353 371 313    Basic Metabolic Panel: Recent Labs  Lab 12/27/22 1647 12/29/22 0431 12/30/22 0444 12/31/22 0320 01/01/23 0449  NA 132* 130* 131* 136 134*  K 3.8 4.2 4.2 3.8 3.9  CL 103 102 100 103 101  CO2 25 24 24 27 28   GLUCOSE 109* 102* 93 83 125*  BUN 7 14 12 18 17   CREATININE 0.75 0.77 0.78 0.82 0.86  CALCIUM 8.6* 8.4* 8.6* 8.4* 8.5*  MG  --   --  1.6*  --  1.8  PHOS  --   --  2.7  --   --     GFR: CrCl cannot be calculated (Unknown ideal weight.).  Liver Function Tests: Recent Labs  Lab 12/27/22 1647 12/29/22 0431  AST 23 20  ALT 14 12  ALKPHOS 114 103  BILITOT 2.2* 1.6*  PROT 7.2 6.2*  ALBUMIN 2.3* 2.0*    CBG: No results for input(s): "GLUCAP" in the last 168 hours.   Recent Results (from the past 240 hour(s))  Blood culture (routine x 2)     Status: None (Preliminary result)   Collection Time: 12/28/22  1:28 AM   Specimen: BLOOD RIGHT ARM  Result Value Ref Range Status   Specimen Description BLOOD RIGHT ARM  Final   Special Requests   Final    BOTTLES DRAWN AEROBIC AND ANAEROBIC Blood Culture adequate volume   Culture   Final    NO GROWTH 4 DAYS Performed at [facility], [address], [place], [place] [address]    Report Status PENDING  Incomplete  Blood culture (routine x 2)     Status: Abnormal   Collection Time: 12/28/22  1:29 AM   Specimen: BLOOD LEFT HAND  Result Value Ref Range Status   Specimen Description BLOOD LEFT HAND  Final   Special Requests   Final    BOTTLES DRAWN AEROBIC ONLY Blood Culture adequate volume   Culture  Setup Time   Final    GRAM POSITIVE RODS AEROBIC BOTTLE  ONLY CRITICAL RESULT CALLED TO, READ BACK BY AND VERIFIED WITH: V BRYK,[email or web address]@[email or web address]  12/29/22 MK    Culture (A)  Final    CORYNEBACTERIUM SPECIES Standardized susceptibility testing for this organism is not available. Performed at [facility], [address] [place], [place] [address]    Report Status 12/31/2022 FINAL  Final  Aerobic/Anaerobic Culture w Gram Stain (surgical/deep wound)     Status: None (Preliminary result)   Collection Time: 12/29/22  9:51 AM   Specimen: Joint, Other; Body Fluid  Result Value Ref Range Status   Specimen Description KNEE  Final   Special Requests RT KNEE ABSCESS  Final   Gram Stain   Final    ABUNDANT WBC PRESENT, PREDOMINANTLY PMN NO ORGANISMS SEEN Performed at  [facility], [address], [place], [place] [address]    Culture   Final    RARE KLEBSIELLA PNEUMONIAE SUSCEPTIBILITIES PERFORMED ON PREVIOUS CULTURE WITHIN THE LAST 5 DAYS. NO ANAEROBES ISOLATED; CULTURE IN PROGRESS FOR 5 DAYS    Report Status PENDING  Incomplete  Aerobic/Anaerobic Culture w Gram Stain (surgical/deep wound)     Status: None (Preliminary result)   Collection Time: 12/29/22  9:58 AM   Specimen: Joint, Other; Body Fluid  Result Value Ref Range Status   Specimen Description KNEE  Final   Special Requests B RT KNEE ABSCESS  Final   Gram Stain   Final    ABUNDANT WBC PRESENT, PREDOMINANTLY PMN NO ORGANISMS SEEN    Culture   Final    FEW KLEBSIELLA PNEUMONIAE Confirmed Extended Spectrum Beta-Lactamase Producer (ESBL).  In bloodstream infections from ESBL organisms, carbapenems are preferred over piperacillin/tazobactam. They are shown to have a lower risk of mortality. NO ANAEROBES ISOLATED; CULTURE IN PROGRESS FOR 5 DAYS Sent to [facility] for further susceptibility testing. Performed at [facility], [address] [place], [place] [address]    Report Status PENDING  Incomplete   Organism ID, Bacteria KLEBSIELLA PNEUMONIAE  Final      Susceptibility    Klebsiella pneumoniae - MIC*    AMPICILLIN >=32 RESISTANT Resistant     CEFEPIME 2 SENSITIVE Sensitive     CEFTAZIDIME RESISTANT Resistant     CEFTRIAXONE >=64 RESISTANT Resistant     CIPROFLOXACIN 0.5 INTERMEDIATE Intermediate     GENTAMICIN <=1 SENSITIVE Sensitive     IMIPENEM <=0.25 SENSITIVE Sensitive     TRIMETH/SULFA >=320 RESISTANT Resistant     AMPICILLIN/SULBACTAM 4 SENSITIVE Sensitive     PIP/TAZO <=4 SENSITIVE Sensitive ug/mL    * FEW KLEBSIELLA PNEUMONIAE  Culture, blood (Routine X 2) w Reflex to ID Panel     Status: None (Preliminary result)   Collection Time: 12/30/22  3:49 PM   Specimen: BLOOD RIGHT ARM  Result Value Ref Range Status   Specimen Description BLOOD RIGHT ARM  Final   Special Requests   Final    BOTTLES DRAWN AEROBIC ONLY Blood Culture results may not be optimal due to an inadequate volume of blood received in culture bottles   Culture   Final    NO GROWTH 2 DAYS Performed at [facility], [address] [place], [place] [address]    Report Status PENDING  Incomplete  Culture, blood (Routine X 2) w Reflex to ID Panel     Status: None (Preliminary result)   Collection Time: 12/30/22  3:49 PM   Specimen: BLOOD RIGHT HAND  Result Value Ref Range Status   Specimen Description BLOOD RIGHT HAND  Final   Special Requests   Final    BOTTLES DRAWN AEROBIC ONLY Blood Culture adequate volume   Culture   Final    NO GROWTH 2 DAYS Performed at [facility], [address], [place], [place] [address]    Report Status PENDING  Incomplete         Radiology Studies: No results found.      Scheduled Meds:  cholecalciferol  1,000 Units Oral Daily   enoxaparin (LOVENOX) injection  40 mg Subcutaneous Q24H   folic acid  1 mg Oral Daily   furosemide  40 mg Oral Daily   ketorolac  30 mg Intravenous Q6H   lactulose  20 g Oral BID   metoprolol tartrate  50 mg  Oral NOW   metoprolol tartrate  75 mg Oral BID   sodium chloride flush  3 mL  Intravenous Q12H   spironolactone  25 mg Oral Daily   Tdap  0.5 mL Intramuscular Once   thiamine  100 mg Oral Daily   Vitamin D (Ergocalciferol)  50,000 Units Oral Q7 days   Continuous Infusions:  diltiazem (CARDIZEM) infusion 5 mg/hr (01/01/23 1610)   ertapenem Stopped (01/01/23 0315)     LOS: 4 days     Huey Bienenstock, MD [facility]   To contact the attending provider between 7A-7P or the covering provider during after hours 7P-7A, please log into the web site [email or web address] and access using universal [place] password for that web site. If you do not have the password, please call the hospital operator.  01/01/2023, 11:34 AM

## 2023-01-02 DIAGNOSIS — M009 Pyogenic arthritis, unspecified: Secondary | ICD-10-CM | POA: Diagnosis not present

## 2023-01-02 DIAGNOSIS — I480 Paroxysmal atrial fibrillation: Secondary | ICD-10-CM | POA: Diagnosis not present

## 2023-01-02 LAB — CULTURE, BLOOD (ROUTINE X 2)
Culture: NO GROWTH
Special Requests: ADEQUATE

## 2023-01-02 MED ORDER — METOPROLOL TARTRATE 50 MG PO TABS
50.0000 mg | ORAL_TABLET | Freq: Two times a day (BID) | ORAL | Status: DC
  Administered 2023-01-02 – 2023-01-03 (×3): 50 mg via ORAL
  Filled 2023-01-02 (×2): qty 1

## 2023-01-02 NOTE — Progress Notes (Signed)
PROGRESS NOTE    Jonathan Ibarra  ZOX:[id number] DOB: 01-03-1966 DOA: 12/27/2022 PCP: Candi Leash, MD   Chief Complaint  Jonathan Ibarra presents with   Knee Pain   Fall    Brief Narrative:   Robin Pafford is a 57 y.o. male with medical history significant of hepatic cirrhosis, hepatic encephalopathy, thrombocytopenia, essential hypertension, polysubstance abuse, and recent admission 12/12/2022 post trauma after falling 6 feet down a river bed requiring ORIF of right knee. He presents to [facility] ED with severe right knee pain after another fall, he reports couple falls recently as well, upon admission his urine drug screen was positive for multiple substances, Jonathan Ibarra had significant purulent drainage from right knee recent surgical wound, Jonathan Ibarra went to the OR 10/30, which was significant for purulent discharge, and concern for septic arthritis.     Assessment & Plan:   Principal Problem:   Septic arthritis of knee, right (HCC) Active Problems:   Atrial fibrillation with rapid ventricular response (HCC)   Essential hypertension   Knee effusion, right   Septic Arthritis of (R) Knee  Post op Infection of Tibial Plateau Fx s/p ORIF and meniscus tear repair (secondary to fall at home) -Jonathan Ibarra presents with significant purulent discharge from his right knee, concern for septic arthritis -Status post I&D 10/30 by Dr. Jena Gauss, remains on wound VAC, management per orthopedic, but so far seems he still having significant output.   -Intraoperative cultures growing Klebsiella pneumonia -Antibiotics management per ID, he is on Invanz  Bacteremia -Blood cultures from 10/29 growing gram-positive rods, it is showing Corynebacterium species, this is contamination, IV vancomycin discontinued  A-fib with RVR -Heart rate uncontrolled on presentation, this is most likely in the setting of polysubstance abuse including cocaine, methamphetamine and sepsis -So far no anticoagulation  secondary to poor compliance, falls, polysubstance abuse, (as well his CHA2DS2-VASc score is 1) -TSH within normal limit -2D echo in August with preserved EF -Was on IV Cardizem as of yesterday, this has been discontinued as he developed some bradycardia while uptitrating his metoprolol, but this morning his heart rate in the 110's so his metoprolol has been further increased.  Vitamin D deficiency -Started on supplements   Polysubstance abuse  UDS positive for methamphetamine, THC, and cocaine. Jonathan Ibarra reports he has not used ETOH in years. - Counseled for cessation  Liver cirrhosis  Status post TIPS. Normal liver enzymes. Has history of hepatitis C - Continue home lactulose, lasix, spironolactone    History of Hepatitis C -Status post eradication.   Hypertension  -Continue home spironolactone, metoprolol, Lasix, please see above discussion regarding increasing his metoprolol   Seizure disorder Not On any AED,   DVT prophylaxis: lovenox Code Status: Full code Family Communication: None at bedside Disposition:   Status is: Inpatient    Consultants:  Orhto Cardiology   Subjective:  Jonathan Ibarra reports he was able to tolerate sitting in the chair yesterday for couple hours.  He reports right knee pain is controlled  Objective: Vitals:   01/01/23 2013 01/01/23 2344 01/02/23 0555 01/02/23 0931  BP: 113/76 112/66 121/82 112/84  Pulse: 63 80 95 (!) 106  Resp: 13 12 18 18   Temp: 97.6 F (36.4 C) 97.6 F (36.4 C) 97.6 F (36.4 C) 97.9 F (36.6 C)  TempSrc: Oral Oral Oral Oral  SpO2: 96% 96% 96% 95%    Intake/Output Summary (Last 24 hours) at 01/02/2023 1210 Last data filed at 01/02/2023 0601 Gross per 24 hour  Intake 3 ml  Output 700  ml  Net -697 ml   There were no vitals filed for this visit.  Examination:  Awake Alert, Oriented X 3, No new F.N deficits, Normal affect Symmetrical Chest wall movement, Good air movement bilaterally, CTAB RRR,No Gallops,Rubs or  new Murmurs, No Parasternal Heave +ve B.Sounds, Abd Soft, No tenderness, No rebound - guarding or rigidity. No Cyanosis, Clubbing , right knee area negative to wound VAC.   Data Reviewed: I have personally reviewed following labs and imaging studies  CBC: Recent Labs  Lab 12/27/22 1657 12/29/22 0431 12/30/22 0444 12/31/22 0320  WBC 10.2 7.4 7.4 6.3  NEUTROABS 7.5  --   --   --   HGB 13.3 11.7* 12.0* 10.6*  HCT 40.7 34.7* 35.6* 32.1*  MCV 92.5 91.1 90.8 90.7  PLT 401* 353 371 313    Basic Metabolic Panel: Recent Labs  Lab 12/27/22 1647 12/29/22 0431 12/30/22 0444 12/31/22 0320 01/01/23 0449  NA 132* 130* 131* 136 134*  K 3.8 4.2 4.2 3.8 3.9  CL 103 102 100 103 101  CO2 25 24 24 27 28   GLUCOSE 109* 102* 93 83 125*  BUN 7 14 12 18 17   CREATININE 0.75 0.77 0.78 0.82 0.86  CALCIUM 8.6* 8.4* 8.6* 8.4* 8.5*  MG  --   --  1.6*  --  1.8  PHOS  --   --  2.7  --   --     GFR: CrCl cannot be calculated (Unknown ideal weight.).  Liver Function Tests: Recent Labs  Lab 12/27/22 1647 12/29/22 0431  AST 23 20  ALT 14 12  ALKPHOS 114 103  BILITOT 2.2* 1.6*  PROT 7.2 6.2*  ALBUMIN 2.3* 2.0*    CBG: No results for input(s): "GLUCAP" in the last 168 hours.   Recent Results (from the past 240 hour(s))  Blood culture (routine x 2)     Status: None   Collection Time: 12/28/22  1:28 AM   Specimen: BLOOD RIGHT ARM  Result Value Ref Range Status   Specimen Description BLOOD RIGHT ARM  Final   Special Requests   Final    BOTTLES DRAWN AEROBIC AND ANAEROBIC Blood Culture adequate volume   Culture   Final    NO GROWTH 5 DAYS Performed at [facility], [address], [place], [place] [address]    Report Status 01/02/2023 FINAL  Final  Blood culture (routine x 2)     Status: Abnormal   Collection Time: 12/28/22  1:29 AM   Specimen: BLOOD LEFT HAND  Result Value Ref Range Status   Specimen Description BLOOD LEFT HAND  Final   Special Requests   Final    BOTTLES  DRAWN AEROBIC ONLY Blood Culture adequate volume   Culture  Setup Time   Final    GRAM POSITIVE RODS AEROBIC BOTTLE ONLY CRITICAL RESULT CALLED TO, READ BACK BY AND VERIFIED WITH: V BRYK,PHARMD@0410  12/29/22 MK    Culture (A)  Final    CORYNEBACTERIUM SPECIES Standardized susceptibility testing for this organism is not available. Performed at [facility], [address], [place], [place] [address]    Report Status 12/31/2022 FINAL  Final  Aerobic/Anaerobic Culture w Gram Stain (surgical/deep wound)     Status: None (Preliminary result)   Collection Time: 12/29/22  9:51 AM   Specimen: Joint, Other; Body Fluid  Result Value Ref Range Status   Specimen Description KNEE  Final   Special Requests RT KNEE ABSCESS  Final   Gram Stain  Final    ABUNDANT WBC PRESENT, PREDOMINANTLY PMN NO ORGANISMS SEEN Performed at [facility], [address], [place], [place] [address]    Culture   Final    RARE KLEBSIELLA PNEUMONIAE SUSCEPTIBILITIES PERFORMED ON PREVIOUS CULTURE WITHIN THE LAST 5 DAYS. NO ANAEROBES ISOLATED; CULTURE IN PROGRESS FOR 5 DAYS    Report Status PENDING  Incomplete  Aerobic/Anaerobic Culture w Gram Stain (surgical/deep wound)     Status: None (Preliminary result)   Collection Time: 12/29/22  9:58 AM   Specimen: Joint, Other; Body Fluid  Result Value Ref Range Status   Specimen Description KNEE  Final   Special Requests B RT KNEE ABSCESS  Final   Gram Stain   Final    ABUNDANT WBC PRESENT, PREDOMINANTLY PMN NO ORGANISMS SEEN    Culture   Final    FEW KLEBSIELLA PNEUMONIAE Confirmed Extended Spectrum Beta-Lactamase Producer (ESBL).  In bloodstream infections from ESBL organisms, carbapenems are preferred over piperacillin/tazobactam. They are shown to have a lower risk of mortality. NO ANAEROBES ISOLATED; CULTURE IN PROGRESS FOR 5 DAYS Sent to [facility] for further susceptibility testing. Performed at [facility], [address] [place], [place]  [address]    Report Status PENDING  Incomplete   Organism ID, Bacteria KLEBSIELLA PNEUMONIAE  Final      Susceptibility   Klebsiella pneumoniae - MIC*    AMPICILLIN >=32 RESISTANT Resistant     CEFEPIME 2 SENSITIVE Sensitive     CEFTAZIDIME RESISTANT Resistant     CEFTRIAXONE >=64 RESISTANT Resistant     CIPROFLOXACIN 0.5 INTERMEDIATE Intermediate     GENTAMICIN <=1 SENSITIVE Sensitive     IMIPENEM <=0.25 SENSITIVE Sensitive     TRIMETH/SULFA >=320 RESISTANT Resistant     AMPICILLIN/SULBACTAM 4 SENSITIVE Sensitive     PIP/TAZO <=4 SENSITIVE Sensitive ug/mL    * FEW KLEBSIELLA PNEUMONIAE  Culture, blood (Routine X 2) w Reflex to ID Panel     Status: None (Preliminary result)   Collection Time: 12/30/22  3:49 PM   Specimen: BLOOD RIGHT ARM  Result Value Ref Range Status   Specimen Description BLOOD RIGHT ARM  Final   Special Requests   Final    BOTTLES DRAWN AEROBIC ONLY Blood Culture results may not be optimal due to an inadequate volume of blood received in culture bottles   Culture   Final    NO GROWTH 3 DAYS Performed at [facility], [address] [place], [place] [address]    Report Status PENDING  Incomplete  Culture, blood (Routine X 2) w Reflex to ID Panel     Status: None (Preliminary result)   Collection Time: 12/30/22  3:49 PM   Specimen: BLOOD RIGHT HAND  Result Value Ref Range Status   Specimen Description BLOOD RIGHT HAND  Final   Special Requests   Final    BOTTLES DRAWN AEROBIC ONLY Blood Culture adequate volume   Culture   Final    NO GROWTH 3 DAYS Performed at [facility], [address] [place], [place] [address]    Report Status PENDING  Incomplete         Radiology Studies: No results found.      Scheduled Meds:  cholecalciferol  1,000 Units Oral Daily   enoxaparin (LOVENOX) injection  40 mg Subcutaneous Q24H   folic acid  1 mg Oral Daily   furosemide  40 mg Oral Daily   lactulose  20 g Oral BID  metoprolol tartrate  50 mg  Oral BID   sodium chloride flush  3 mL Intravenous Q12H   spironolactone  25 mg Oral Daily   Tdap  0.5 mL Intramuscular Once   thiamine  100 mg Oral Daily   Vitamin D (Ergocalciferol)  50,000 Units Oral Q7 days   Continuous Infusions:  ertapenem Stopped (01/02/23 0214)     LOS: 5 days     Huey Bienenstock, MD [facility]   To contact the attending provider between 7A-7P or the covering provider during after hours 7P-7A, please log into the web site [email or web address] and access using universal [place] password for that web site. If you do not have the password, please call the hospital operator.  01/02/2023, 12:10 PM

## 2023-01-02 NOTE — Plan of Care (Signed)
  Problem: Activity: Goal: Ability to tolerate increased activity will improve Outcome: Progressing   Problem: Clinical Measurements: Goal: Respiratory complications will improve Outcome: Progressing   Problem: Activity: Goal: Risk for activity intolerance will decrease Outcome: Progressing   Problem: Pain Management: Goal: General experience of comfort will improve Outcome: Progressing

## 2023-01-03 DIAGNOSIS — I480 Paroxysmal atrial fibrillation: Secondary | ICD-10-CM | POA: Diagnosis not present

## 2023-01-03 DIAGNOSIS — M009 Pyogenic arthritis, unspecified: Secondary | ICD-10-CM | POA: Diagnosis not present

## 2023-01-03 LAB — AEROBIC/ANAEROBIC CULTURE W GRAM STAIN (SURGICAL/DEEP WOUND)

## 2023-01-03 MED ORDER — DILTIAZEM HCL 25 MG/5ML IV SOLN
10.0000 mg | Freq: Four times a day (QID) | INTRAVENOUS | Status: DC | PRN
Start: 2023-01-03 — End: 2023-02-04

## 2023-01-03 MED ORDER — DIGOXIN 0.25 MG/ML IJ SOLN
0.2500 mg | Freq: Once | INTRAMUSCULAR | Status: AC
  Administered 2023-01-03: 0.25 mg via INTRAVENOUS
  Filled 2023-01-03: qty 1

## 2023-01-03 MED ORDER — SODIUM CHLORIDE 0.9 % IV SOLN: INTRAVENOUS | Status: DC

## 2023-01-03 MED ORDER — METOPROLOL TARTRATE 50 MG PO TABS
75.0000 mg | ORAL_TABLET | Freq: Two times a day (BID) | ORAL | Status: DC
  Administered 2023-01-03: 75 mg via ORAL
  Filled 2023-01-03: qty 1

## 2023-01-03 NOTE — Plan of Care (Signed)
  Problem: Education: Goal: Knowledge of disease or condition will improve Outcome: Progressing Goal: Understanding of medication regimen will improve Outcome: Progressing Goal: Individualized Educational Video(s) Outcome: Progressing   Problem: Activity: Goal: Ability to tolerate increased activity will improve Outcome: Progressing   Problem: Cardiac: Goal: Ability to achieve and maintain adequate cardiopulmonary perfusion will improve Outcome: Progressing   Problem: Health Behavior/Discharge Planning: Goal: Ability to safely manage health-related needs after discharge will improve Outcome: Progressing   Problem: Education: Goal: Knowledge of General Education information will improve Description: Including pain rating scale, medication(s)/side effects and non-pharmacologic comfort measures Outcome: Progressing   Problem: Health Behavior/Discharge Planning: Goal: Ability to manage health-related needs will improve Outcome: Progressing   Problem: Clinical Measurements: Goal: Ability to maintain clinical measurements within normal limits will improve Outcome: Progressing Goal: Will remain free from infection Outcome: Progressing Goal: Diagnostic test results will improve Outcome: Progressing Goal: Respiratory complications will improve Outcome: Progressing Goal: Cardiovascular complication will be avoided Outcome: Progressing   Problem: Activity: Goal: Risk for activity intolerance will decrease Outcome: Progressing   Problem: Nutrition: Goal: Adequate nutrition will be maintained Outcome: Progressing   Problem: Coping: Goal: Level of anxiety will decrease Outcome: Progressing   Problem: Elimination: Goal: Will not experience complications related to bowel motility Outcome: Progressing Goal: Will not experience complications related to urinary retention Outcome: Progressing   Problem: Pain Management: Goal: General experience of comfort will  improve Outcome: Progressing   Problem: Safety: Goal: Ability to remain free from injury will improve Outcome: Progressing   Problem: Skin Integrity: Goal: Risk for impaired skin integrity will decrease Outcome: Progressing

## 2023-01-03 NOTE — Progress Notes (Signed)
PROGRESS NOTE    Jonathan Ibarra  BMW:[id number] DOB: [birth year] DOA: 12/27/2022 PCP: Candi Leash, MD   Chief Complaint  Patient presents with   Knee Pain   Fall    Brief Narrative:   Jonathan Ibarra is a 57 y.o. male with medical history significant of hepatic cirrhosis, hepatic encephalopathy, thrombocytopenia, essential hypertension, polysubstance abuse, and recent admission 12/12/2022 post trauma after falling 6 feet down a river bed requiring ORIF of right knee. He presents to [facility] ED with severe right knee pain after another fall, he reports couple falls recently as well, upon admission his urine drug screen was positive for multiple substances, patient had significant purulent drainage from right knee recent surgical wound, patient went to the OR 10/30, which was significant for purulent discharge, and concern for septic arthritis.     Assessment & Plan:   Principal Problem:   Septic arthritis of knee, right (HCC) Active Problems:   Atrial fibrillation with rapid ventricular response (HCC)   Essential hypertension   Knee effusion, right   Septic Arthritis of (R) Knee  Post op Infection of Tibial Plateau Fx s/p ORIF and meniscus tear repair (secondary to fall at home) -Patient presents with significant purulent discharge from his right knee, concern for septic arthritis -Status post I&D 10/30 by Dr. Jena Gauss, remains on wound VAC, management per orthopedic, but so far seems he still having significant output.   -Intraoperative cultures growing Klebsiella pneumonia -Antibiotics management per ID, he is on Invanz  Bacteremia -Blood cultures from 10/29 growing gram-positive rods, it is showing Corynebacterium species, this is contamination, IV vancomycin discontinued  A-fib with RVR -Heart rate uncontrolled on presentation, this is most likely in the setting of polysubstance abuse including cocaine, methamphetamine and sepsis -So far no anticoagulation  secondary to poor compliance, falls, polysubstance abuse, (as well his CHA2DS2-VASc score is 1) -TSH within normal limit -2D echo in August with preserved EF -IV Cardizem has been discontinued yesterday, he is currently on metoprolol, heart rate remains uncontrolled, with soft blood pressure, will give 1 dose of IV digoxin, will start on IV fluids, if needed will resume back on IV Cardizem versus IV amiodarone as blood pressure remains soft.    Vitamin D deficiency -Started on supplements   Polysubstance abuse  UDS positive for methamphetamine, THC, and cocaine. Patient reports he has not used ETOH in years. - Counseled for cessation  Liver cirrhosis  Status post TIPS. Normal liver enzymes. Has history of hepatitis C - Continue home lactulose, lasix, spironolactone    History of Hepatitis C -Status post eradication.   Hypertension  -Continue home spironolactone, metoprolol, Lasix, please see above discussion regarding increasing his metoprolol   Seizure disorder Not On any AED,   DVT prophylaxis: lovenox Code Status: Full code Family Communication: None at bedside Disposition:   Status is: Inpatient    Consultants:  Orhto Cardiology   Subjective:  Afebrile, reports right knee pain is controlled, he is tachycardic, blood pressure is soft.  Objective: Vitals:   01/02/23 2312 01/03/23 0024 01/03/23 0513 01/03/23 0700  BP: 101/65  (!) 105/92 (!) 80/60  Pulse: (!) 111  (!) 102   Resp: 12  17 (!) 5  Temp: 98 F (36.7 C)  98 F (36.7 C) 98.2 F (36.8 C)  TempSrc: Oral  Oral Oral  SpO2: 96% 95% 95%   Weight:      Height:        Intake/Output Summary (Last 24 hours) at 01/03/2023 1302  Last data filed at 01/03/2023 0314 Gross per 24 hour  Intake 3 ml  Output 1100 ml  Net -1097 ml   Filed Weights   01/02/23 1247  Weight: 123.6 kg    Examination:  Awake Alert, Oriented X 3, No new F.N deficits, Normal affect Symmetrical Chest wall movement, Good air  movement bilaterally, CTAB Irregular, tachycardic,No Gallops,Rubs or new Murmurs, No Parasternal Heave +ve B.Sounds, Abd Soft, No tenderness, No rebound - guarding or rigidity. No Cyanosis, Clubbing, right knee connected to wound VAC    Data Reviewed: I have personally reviewed following labs and imaging studies  CBC: Recent Labs  Lab 12/27/22 1657 12/29/22 0431 12/30/22 0444 12/31/22 0320  WBC 10.2 7.4 7.4 6.3  NEUTROABS 7.5  --   --   --   HGB 13.3 11.7* 12.0* 10.6*  HCT 40.7 34.7* 35.6* 32.1*  MCV 92.5 91.1 90.8 90.7  PLT 401* 353 371 313    Basic Metabolic Panel: Recent Labs  Lab 12/27/22 1647 12/29/22 0431 12/30/22 0444 12/31/22 0320 01/01/23 0449  NA 132* 130* 131* 136 134*  K 3.8 4.2 4.2 3.8 3.9  CL 103 102 100 103 101  CO2 25 24 24 27 28   GLUCOSE 109* 102* 93 83 125*  BUN 7 14 12 18 17   CREATININE 0.75 0.77 0.78 0.82 0.86  CALCIUM 8.6* 8.4* 8.6* 8.4* 8.5*  MG  --   --  1.6*  --  1.8  PHOS  --   --  2.7  --   --     GFR: Estimated Creatinine Clearance: 130.2 mL/min (by C-G formula based on SCr of 0.86 mg/dL).  Liver Function Tests: Recent Labs  Lab 12/27/22 1647 12/29/22 0431  AST 23 20  ALT 14 12  ALKPHOS 114 103  BILITOT 2.2* 1.6*  PROT 7.2 6.2*  ALBUMIN 2.3* 2.0*    CBG: No results for input(s): "GLUCAP" in the last 168 hours.   Recent Results (from the past 240 hour(s))  Blood culture (routine x 2)     Status: None   Collection Time: 12/28/22  1:28 AM   Specimen: BLOOD RIGHT ARM  Result Value Ref Range Status   Specimen Description BLOOD RIGHT ARM  Final   Special Requests   Final    BOTTLES DRAWN AEROBIC AND ANAEROBIC Blood Culture adequate volume   Culture   Final    NO GROWTH 5 DAYS Performed at [facility], [address] [place], [place] [address]    Report Status 01/02/2023 FINAL  Final  Blood culture (routine x 2)     Status: Abnormal   Collection Time: 12/28/22  1:29 AM   Specimen: BLOOD LEFT HAND  Result Value  Ref Range Status   Specimen Description BLOOD LEFT HAND  Final   Special Requests   Final    BOTTLES DRAWN AEROBIC ONLY Blood Culture adequate volume   Culture  Setup Time   Final    GRAM POSITIVE RODS AEROBIC BOTTLE ONLY CRITICAL RESULT CALLED TO, READ BACK BY AND VERIFIED WITH: V BRYK,PHARMD@0410  12/29/22 MK    Culture (A)  Final    CORYNEBACTERIUM SPECIES Standardized susceptibility testing for this organism is not available. Performed at [facility], [address], [place], [place] [address]    Report Status 12/31/2022 FINAL  Final  Aerobic/Anaerobic Culture w Gram Stain (surgical/deep wound)     Status: None   Collection Time: 12/29/22  9:51 AM   Specimen: Joint, Other; Body Fluid  Result Value Ref Range Status   Specimen Description KNEE  Final   Special Requests RT KNEE ABSCESS  Final   Gram Stain   Final    ABUNDANT WBC PRESENT, PREDOMINANTLY PMN NO ORGANISMS SEEN    Culture   Final    RARE KLEBSIELLA PNEUMONIAE SUSCEPTIBILITIES PERFORMED ON PREVIOUS CULTURE WITHIN THE LAST 5 DAYS. NO ANAEROBES ISOLATED Performed at [facility], [address], [place], [place] [address]    Report Status 01/03/2023 FINAL  Final  Aerobic/Anaerobic Culture w Gram Stain (surgical/deep wound)     Status: None (Preliminary result)   Collection Time: 12/29/22  9:58 AM   Specimen: Joint, Other; Body Fluid  Result Value Ref Range Status   Specimen Description KNEE  Final   Special Requests B RT KNEE ABSCESS  Final   Gram Stain   Final    ABUNDANT WBC PRESENT, PREDOMINANTLY PMN NO ORGANISMS SEEN    Culture   Final    FEW KLEBSIELLA PNEUMONIAE Confirmed Extended Spectrum Beta-Lactamase Producer (ESBL).  In bloodstream infections from ESBL organisms, carbapenems are preferred over piperacillin/tazobactam. They are shown to have a lower risk of mortality. NO ANAEROBES ISOLATED Sent to [facility] for further susceptibility testing. Performed at [facility], [address], [place], [place] [address]    Report Status PENDING  Incomplete   Organism ID, Bacteria KLEBSIELLA PNEUMONIAE  Final      Susceptibility   Klebsiella pneumoniae - MIC*    AMPICILLIN >=32 RESISTANT Resistant     CEFEPIME 2 SENSITIVE Sensitive     CEFTAZIDIME RESISTANT Resistant     CEFTRIAXONE >=64 RESISTANT Resistant     CIPROFLOXACIN 0.5 INTERMEDIATE Intermediate     GENTAMICIN <=1 SENSITIVE Sensitive     IMIPENEM <=0.25 SENSITIVE Sensitive     TRIMETH/SULFA >=320 RESISTANT Resistant     AMPICILLIN/SULBACTAM 4 SENSITIVE Sensitive     PIP/TAZO <=4 SENSITIVE Sensitive ug/mL    * FEW KLEBSIELLA PNEUMONIAE  Culture, blood (Routine X 2) w Reflex to ID Panel     Status: None (Preliminary result)   Collection Time: 12/30/22  3:49 PM   Specimen: BLOOD RIGHT ARM  Result Value Ref Range Status   Specimen Description BLOOD RIGHT ARM  Final   Special Requests   Final    BOTTLES DRAWN AEROBIC ONLY Blood Culture results may not be optimal due to an inadequate volume of blood received in culture bottles   Culture   Final    NO GROWTH 3 DAYS Performed at [facility], [address], [place], [place] [address]    Report Status PENDING  Incomplete  Culture, blood (Routine X 2) w Reflex to ID Panel     Status: None (Preliminary result)   Collection Time: 12/30/22  3:49 PM   Specimen: BLOOD RIGHT HAND  Result Value Ref Range Status   Specimen Description BLOOD RIGHT HAND  Final   Special Requests   Final    BOTTLES DRAWN AEROBIC ONLY Blood Culture adequate volume   Culture   Final    NO GROWTH 3 DAYS Performed at [facility], [address] [place], [place] [address]    Report Status PENDING  Incomplete         Radiology Studies: No results found.      Scheduled Meds:  cholecalciferol  1,000 Units Oral Daily   enoxaparin (LOVENOX) injection  40 mg Subcutaneous Q24H   folic acid  1 mg Oral Daily  furosemide  40 mg Oral Daily   lactulose  20 g Oral BID    metoprolol tartrate  50 mg Oral BID   sodium chloride flush  3 mL Intravenous Q12H   spironolactone  25 mg Oral Daily   thiamine  100 mg Oral Daily   Vitamin D (Ergocalciferol)  50,000 Units Oral Q7 days   Continuous Infusions:  sodium chloride 75 mL/hr at 01/03/23 1232   ertapenem 1 g (01/03/23 0843)     LOS: 6 days     Huey Bienenstock, MD [facility]   To contact the attending provider between 7A-7P or the covering provider during after hours 7P-7A, please log into the web site [email or web address] and access using universal  password for that web site. If you do not have the password, please call the hospital operator.  01/03/2023, 1:02 PM

## 2023-01-03 NOTE — Progress Notes (Signed)
Physical Therapy Treatment Patient Details Name: Jonathan Ibarra MRN: [id number] DOB: 1965/09/08 Today's Date: 01/03/2023   History of Present Illness Pt is a 57 y/o male presenting 10/28 to [facility] with severe right knee pain and drainage after a fall the night PTA, he is now s/p R knee I&D on 12/29/22. He recently had a R tibial ORIF on 12/13/22 after a falls incidence. NWG:NFAOZHY abuse, [name], HTN, liver cirrhosis, polysubstance abuse.    PT Comments  Pt received in supine and agreeable to session. Pt slightly impulsive and demonstrating decreased awareness of deficits requiring cues for safety intermittently. Pt requires dense cues for hand placement to stand, but is able to complete without physical assist. Pt able to tolerate slightly increased gait distance in the room with improved maintenance of RLE NWB, however is limited by fatigue and pain. Pt continues to benefit from PT services to progress toward functional mobility goals.    If plan is discharge home, [name];[name];Assistance with cooking/housework;Assist for transportation;Help with stairs or ramp for entrance   Can travel by private vehicle        Equipment Recommendations  None recommended by PT    Recommendations for Other Services       Precautions / Restrictions Precautions Precautions: Fall Restrictions Weight Bearing Restrictions: Yes RLE Weight Bearing: Non weight bearing     Mobility  Bed Mobility Overal bed mobility: Needs Assistance Bed Mobility: Supine to Sit     Supine to sit: Contact guard, [name], Used rails     General bed mobility comments: increased time    Transfers Overall transfer level: Needs assistance Equipment used: Rolling walker (2 wheels) Transfers: Sit to/from Stand Sit to Stand: Contact guard assist           General transfer comment: Multiple attempts and dense  cues for RLE and hand placement    Ambulation/Gait Ambulation/Gait assistance: Contact guard assist Gait Distance (Feet): 15 Feet Assistive device: Rolling walker (2 wheels) Gait Pattern/deviations: Trunk flexed (hop-to pattern)       General Gait Details: Pt able to keep RLE off of floor to maintain NWB this session. Assist for line management and CGA for safety      Balance Overall balance assessment: Needs assistance Sitting-balance support: No upper extremity supported, Feet supported Sitting balance-Leahy Scale: Good Sitting balance - Comments: sitting EOB   Standing balance support: Bilateral upper extremity supported, During functional activity, Reliant on assistive device for balance Standing balance-Leahy Scale: Poor Standing balance comment: reliant on RW support                            Cognition Arousal: Alert Behavior During Therapy: WFL for tasks assessed/performed, Impulsive Overall Cognitive Status: Within Functional Limits for tasks assessed                                 General Comments: Requires cues for safety due to decreased awareness of deficits        Exercises      General Comments General comments (skin integrity, edema, etc.): HR up to 160's with mobility      Pertinent Vitals/Pain Pain Assessment Pain Assessment: 0-10 Pain Score: 10-Worst pain ever Pain Location: RLE Pain Descriptors / Indicators: Discomfort, Aching, Moaning Pain Intervention(s): Monitored during session, Limited activity within patient's tolerance,  Repositioned     PT Goals (current goals can now be found in the care plan section) Acute Rehab PT Goals Patient Stated Goal: decrease pain and go home PT Goal Formulation: With patient Time For Goal Achievement: 01/12/23 Progress towards PT goals: Progressing toward goals    Frequency    Min 1X/week       AM-PAC PT "6 Clicks" Mobility   Outcome Measure  Help needed turning from  your back to your side while in a flat bed without using bedrails?: None Help needed moving from lying on your back to sitting on the side of a flat bed without using bedrails?: A Little Help needed moving to and from a bed to a chair (including a wheelchair)?: A Little Help needed standing up from a chair using your arms (e.g., wheelchair or bedside chair)?: A Little Help needed to walk in hospital room?: A Little Help needed climbing 3-5 steps with a railing? : Total 6 Click Score: 17    End of Session Equipment Utilized During Treatment: Gait belt Activity Tolerance: Patient tolerated treatment well;Patient limited by fatigue Patient left: in chair;with chair alarm set;with call bell/phone within reach Nurse Communication: Mobility status PT Visit Diagnosis: Muscle weakness (generalized) (M62.81);Other abnormalities of gait and mobility (R26.89)     Time: 1610-9604 PT Time Calculation (min) (ACUTE ONLY): 29 min  Charges:    $Gait Training: 8-22 mins $Therapeutic Activity: 8-22 mins PT General Charges $$ ACUTE PT VISIT: 1 Visit                     [name], PTA Acute Rehabilitation Services Secure Chat Preferred  [phone]    [name] 01/03/2023, 12:58 PM

## 2023-01-03 NOTE — Plan of Care (Signed)
  Problem: Education: Goal: Knowledge of disease or condition will improve Outcome: Progressing   Problem: Clinical Measurements: Goal: Ability to maintain clinical measurements within normal limits will improve Outcome: Progressing Goal: Respiratory complications will improve Outcome: Progressing   Problem: Pain Management: Goal: General experience of comfort will improve Outcome: Progressing

## 2023-01-03 NOTE — Consult Note (Signed)
WOC consulted for leaking incisional VAC, has orders per ortho to remove and place dry dressing. Made bedside nursing staff aware. CC to orthopedics as well.  Develle Sievers [facility], CNS, [employer] [phone]

## 2023-01-04 DIAGNOSIS — M009 Pyogenic arthritis, unspecified: Secondary | ICD-10-CM | POA: Diagnosis not present

## 2023-01-04 DIAGNOSIS — M00861 Arthritis due to other bacteria, right knee: Secondary | ICD-10-CM | POA: Diagnosis not present

## 2023-01-04 DIAGNOSIS — I480 Paroxysmal atrial fibrillation: Secondary | ICD-10-CM | POA: Diagnosis not present

## 2023-01-04 LAB — CBC
HCT: 32.3 % — ABNORMAL LOW (ref 39.0–52.0)
Hemoglobin: 11 g/dL — ABNORMAL LOW (ref 13.0–17.0)
MCH: 30.6 pg (ref 26.0–34.0)
MCHC: 34.1 g/dL (ref 30.0–36.0)
MCV: 89.7 fL (ref 80.0–100.0)
Platelets: 226 10*3/uL (ref 150–400)
RBC: 3.6 MIL/uL — ABNORMAL LOW (ref 4.22–5.81)
RDW: 14.2 % (ref 11.5–15.5)
WBC: 4.1 10*3/uL (ref 4.0–10.5)
nRBC: 0 % (ref 0.0–0.2)

## 2023-01-04 LAB — CULTURE, BLOOD (ROUTINE X 2)
Culture: NO GROWTH
Culture: NO GROWTH
Special Requests: ADEQUATE

## 2023-01-04 LAB — BASIC METABOLIC PANEL
Anion gap: 5 (ref 5–15)
BUN: 12 mg/dL (ref 6–20)
CO2: 28 mmol/L (ref 22–32)
Calcium: 8.3 mg/dL — ABNORMAL LOW (ref 8.9–10.3)
Chloride: 99 mmol/L (ref 98–111)
Creatinine, Ser: 0.81 mg/dL (ref 0.61–1.24)
GFR, Estimated: >60 mL/min (ref >60)
Glucose, Bld: 91 mg/dL (ref 70–99)
Potassium: 3.9 mmol/L (ref 3.5–5.1)
Sodium: 132 mmol/L — ABNORMAL LOW (ref 135–145)

## 2023-01-04 LAB — MAGNESIUM: Magnesium: 1.6 mg/dL — ABNORMAL LOW (ref 1.7–2.4)

## 2023-01-04 MED ORDER — DIGOXIN 0.25 MG/ML IJ SOLN
0.2500 mg | Freq: Once | INTRAMUSCULAR | Status: AC
  Administered 2023-01-04: 0.25 mg via INTRAVENOUS
  Filled 2023-01-04: qty 1

## 2023-01-04 MED ORDER — METOPROLOL TARTRATE 100 MG PO TABS
100.0000 mg | ORAL_TABLET | Freq: Two times a day (BID) | ORAL | Status: DC
  Administered 2023-01-04 – 2023-01-07 (×6): 100 mg via ORAL
  Filled 2023-01-04 (×9): qty 1

## 2023-01-04 MED ORDER — MIDODRINE HCL 5 MG PO TABS
5.0000 mg | ORAL_TABLET | Freq: Three times a day (TID) | ORAL | Status: DC
  Administered 2023-01-04 – 2023-01-08 (×12): 5 mg via ORAL
  Filled 2023-01-04 (×12): qty 1

## 2023-01-04 MED ORDER — MAGNESIUM SULFATE 4 GM/100ML IV SOLN
4.0000 g | Freq: Once | INTRAVENOUS | Status: AC
  Administered 2023-01-04: 4 g via INTRAVENOUS
  Filled 2023-01-04: qty 100

## 2023-01-04 NOTE — Progress Notes (Signed)
Occupational Therapy Treatment Patient Details Name: Dezman Granda MRN: [id number] DOB: 1965/05/19 Today's Date: 01/04/2023   History of present illness Pt is a 57 y/o male presenting 10/28 to [facility] with severe right knee pain and drainage after a fall the night PTA, he is now s/p R knee I&D on 12/29/22. He recently had a R tibial ORIF on 12/13/22 after a falls incidence. YNW:GNFAOZH abuse, [name], HTN, liver cirrhosis, polysubstance abuse.   OT comments  Pt c/o pain at rest, 10/10, but eager to participate and complete OOB activities. Pt min A to assist RLE to for bed mobility. Increased time due to pain and line management. Pt assisted to bathroom with CGA using RW. Pt able to complete toileting with increased time and mod I sitting/lateral leans. Pt stood and ambulated back to bed with CGA. Pt completed ADLs sitting at EOB. Pt has poor balance, reliant on RW for support while standing. Pt DC plan still appropriate, will continue to see acutely to improve as able.       If plan is discharge home, recommend the following:  Assistance with cooking/housework;Help with stairs or ramp for entrance;Assist for transportation;[name];A little help with walking and/or transfers   Equipment Recommendations  Tub/shower bench    Recommendations for Other Services      Precautions / Restrictions Precautions Precautions: Fall Restrictions Weight Bearing Restrictions: Yes RLE Weight Bearing: Non weight bearing       Mobility Bed Mobility Overal bed mobility: Needs Assistance Bed Mobility: Supine to Sit, Sit to Supine     Supine to sit: Min assist, [name], Used rails Sit to supine: Min assist, [name]   General bed mobility comments: increased time, min A for RLE    Transfers Overall transfer level: Needs assistance Equipment used: Rolling walker (2 wheels) Transfers: Sit to/from Stand, Bed to chair/wheelchair/BSC Sit to Stand: Contact  guard assist     Step pivot transfers: Contact guard assist     General transfer comment: CGA for transfer from bed to bathroom     Balance Overall balance assessment: Needs assistance Sitting-balance support: No upper extremity supported, Feet supported Sitting balance-Leahy Scale: Good Sitting balance - Comments: sitting EOB   Standing balance support: Bilateral upper extremity supported, During functional activity, Reliant on assistive device for balance Standing balance-Leahy Scale: Poor Standing balance comment: reliant on RW support                           ADL either performed or assessed with clinical judgement   ADL Overall ADL's : Needs assistance/impaired Eating/Feeding: Independent;Sitting   Grooming: Set up;Sitting       Lower Body Bathing: Modified independent;Sitting/lateral leans   Upper Body Dressing : Set up;Sitting       Toilet Transfer: Contact guard assist;Ambulation;Comfort height toilet   Toileting- Clothing Manipulation and Hygiene: Set up;Sitting/lateral lean       Functional mobility during ADLs: Contact guard assist;Rolling walker (2 wheels) General ADL Comments: Pt CGA for mobility/transfers, ambulated to bathroom with RW. Pt able to complete toileting hygiene while sitting mod I. Pt not able to stand unsupported, needs set up for hand hygiene while sitting.    Extremity/Trunk Assessment Upper Extremity Assessment Upper Extremity Assessment: Overall WFL for tasks assessed            Vision       Perception     Praxis      Cognition  Arousal: Alert Behavior During Therapy: WFL for tasks assessed/performed Overall Cognitive Status: Within Functional Limits for tasks assessed                                          Exercises      Shoulder Instructions       General Comments      Pertinent Vitals/ Pain       Pain Assessment Pain Assessment: 0-10 Pain Score: 10-Worst pain ever Faces Pain  Scale: Hurts whole lot Pain Location: R knee Pain Descriptors / Indicators: Discomfort, Aching, Moaning Pain Intervention(s): Monitored during session  Home Living                                          Prior Functioning/Environment              Frequency  Min 1X/week        Progress Toward Goals  OT Goals(current goals can now be found in the care plan section)  Progress towards OT goals: Progressing toward goals  Acute Rehab OT Goals Patient Stated Goal: to return home OT Goal Formulation: With patient Time For Goal Achievement: 01/12/23 Potential to Achieve Goals: Good ADL Goals Pt Will Perform Grooming: with modified independence;sitting Pt Will Perform Lower Body Bathing: with modified independence;sitting/lateral leans Pt Will Perform Lower Body Dressing: with modified independence;sitting/lateral leans Pt Will Transfer to Toilet: ambulating;with supervision;bedside commode Pt Will Perform Toileting - Clothing Manipulation and hygiene: sit to/from stand;with modified independence;sitting/lateral leans Pt Will Perform Tub/Shower Transfer: with modified independence;Tub transfer;tub bench;rolling walker  Plan      Co-evaluation                 AM-PAC OT "6 Clicks" Daily Activity     Outcome Measure   Help from another person eating meals?: None Help from another person taking care of personal grooming?: A Little Help from another person toileting, which includes using toliet, bedpan, or urinal?: A Little Help from another person bathing (including washing, rinsing, drying)?: None Help from another person to put on and taking off regular upper body clothing?: A Little Help from another person to put on and taking off regular lower body clothing?: Total 6 Click Score: 18    End of Session Equipment Utilized During Treatment: Gait belt;Rolling walker (2 wheels)  OT Visit Diagnosis: Pain;Unsteadiness on feet (R26.81);Other  abnormalities of gait and mobility (R26.89);History of falling (Z91.81) Pain - Right/Left: Right Pain - part of body: Knee   Activity Tolerance Patient tolerated treatment well   Patient Left in chair;with chair alarm set;with call bell/phone within reach   Nurse Communication Mobility status        Time: 3220-2542 OT Time Calculation (min): 36 min  Charges: OT General Charges $OT Visit: 1 Visit OT Treatments $Self Care/Home Management : 23-37 mins  [place], OTR/L   Alexis Goodell 01/04/2023, 3:27 PM

## 2023-01-04 NOTE — Progress Notes (Addendum)
PROGRESS NOTE    Jonathan Ibarra  ZOX:[id number] DOB: 09-12-1965 DOA: 12/27/2022 PCP: Candi Leash, MD   Chief Complaint  Patient presents with   Knee Pain   Fall    Brief Narrative:   Jonathan Ibarra is a 57 y.o. male with medical history significant of hepatic cirrhosis, hepatic encephalopathy, thrombocytopenia, essential hypertension, polysubstance abuse, and recent admission 12/12/2022 post trauma after falling 6 feet down a river bed requiring ORIF of right knee. He presents to [facility] ED with severe right knee pain after another fall, he reports couple falls recently as well, upon admission his urine drug screen was positive for multiple substances, patient had significant purulent drainage from right knee recent surgical wound, patient went to the OR 10/30, which was significant for purulent discharge, and concern for septic arthritis.  ID consulted, recommendation for 6 weeks of IV antibiotics    Assessment & Plan:   Principal Problem:   Septic arthritis of knee, right (HCC) Active Problems:   Atrial fibrillation with rapid ventricular response (HCC)   Essential hypertension   Knee effusion, right   Septic Arthritis of (R) Knee  Post op Infection of Tibial Plateau Fx s/p ORIF and meniscus tear repair (secondary to fall at home) -Patient presents with significant purulent discharge from his right knee, concern for septic arthritis -Status post I&D 10/30 by Dr. Jena Gauss, [facility] was discontinued yesterday,  -Intraoperative cultures growing Klebsiella pneumonia -Antibiotics management per ID, he is on Invanz, will need 6 weeks of IV antibiotics, he is not a candidate for PICC line as an outpatient with IV antibiotics due to his polysubstance abuse  Bacteremia -Blood cultures from 10/29 growing gram-positive rods, it is showing Corynebacterium species, this is contamination, IV vancomycin discontinued  A-fib with RVR -Heart rate uncontrolled on presentation, this is  most likely in the setting of polysubstance abuse including cocaine, methamphetamine and sepsis -So far no anticoagulation secondary to poor compliance, falls, polysubstance abuse, (as well his CHA2DS2-VASc score is 1) -TSH within normal limit -2D echo in August with preserved EF -IV Cardizem drip for few days initially, he is transition to p.o. metoprolol, blood pressure remains soft, heart rate uncontrolled, will increase metoprolol to 100 mg oral twice daily, will give another dose of digoxin today, will start on midodrine to allow more room and blood pressure for AV blocking agent up titration if needed   Vitamin D deficiency -Started on supplements   Polysubstance abuse  UDS positive for methamphetamine, THC, and cocaine. Patient reports he has not used ETOH in years. - Counseled for cessation  Hypomagnesemia -Repleted  Liver cirrhosis  Status post TIPS. Normal liver enzymes. Has history of hepatitis C - Continue home lactulose -Continue Lasix and Aldactone secondary to low blood pressure  History of Hepatitis C -Status post eradication.   Hypertension  -Blood pressure remains low, will DC Lasix, Aldactone .  Will start on low-dose midodrine -He remains on metoprolol for heart rate control  Seizure disorder Not On any AED,   DVT prophylaxis: lovenox Code Status: Full code Family Communication: None at bedside Disposition:   Status is: Inpatient    Consultants:  [facility]   Subjective:  Afebrile, reports right knee pain is controlled, he is tachycardic, blood pressure is soft.  Objective: Vitals:   01/03/23 2051 01/03/23 2321 01/04/23 0325 01/04/23 0830  BP: 106/64 102/76 108/66 132/78  Pulse: 99 88 91 100  Resp:  13 20 18   Temp:  98 F (36.7 C) 98.1 F (  36.7 C) 98.3 F (36.8 C)  TempSrc:  Oral Oral Oral  SpO2:  96% 90%   Weight:      Height:        Intake/Output Summary (Last 24 hours) at 01/04/2023 1207 Last data filed at 01/04/2023  0600 Gross per 24 hour  Intake --  Output 400 ml  Net -400 ml   Filed Weights   01/02/23 1247  Weight: 123.6 kg    Examination:  Awake Alert, Oriented X 3, No new F.N deficits, Normal affect Symmetrical Chest wall movement, Good air movement bilaterally, CTAB Irregular,, no Gallops,Rubs or new Murmurs, No Parasternal Heave +ve B.Sounds, Abd Soft, No tenderness, No rebound - guarding or rigidity. No Cyanosis, Clubbing, right knee bandaged     Data Reviewed: I have personally reviewed following labs and imaging studies  CBC: Recent Labs  Lab 12/29/22 0431 12/30/22 0444 12/31/22 0320 01/04/23 0430  WBC 7.4 7.4 6.3 4.1  HGB 11.7* 12.0* 10.6* 11.0*  HCT 34.7* 35.6* 32.1* 32.3*  MCV 91.1 90.8 90.7 89.7  PLT 353 371 313 226    Basic Metabolic Panel: Recent Labs  Lab 12/29/22 0431 12/30/22 0444 12/31/22 0320 01/01/23 0449 01/04/23 0430  NA 130* 131* 136 134* 132*  K 4.2 4.2 3.8 3.9 3.9  CL 102 100 103 101 99  CO2 24 24 27 28 28   GLUCOSE 102* 93 83 125* 91  BUN 14 12 18 17 12   CREATININE 0.77 0.78 0.82 0.86 0.81  CALCIUM 8.4* 8.6* 8.4* 8.5* 8.3*  MG  --  1.6*  --  1.8 1.6*  PHOS  --  2.7  --   --   --     GFR: Estimated Creatinine Clearance: 138.3 mL/min (by C-G formula based on SCr of 0.81 mg/dL).  Liver Function Tests: Recent Labs  Lab 12/29/22 0431  AST 20  ALT 12  ALKPHOS 103  BILITOT 1.6*  PROT 6.2*  ALBUMIN 2.0*    CBG: No results for input(s): "GLUCAP" in the last 168 hours.   Recent Results (from the past 240 hour(s))  Blood culture (routine x 2)     Status: None   Collection Time: 12/28/22  1:28 AM   Specimen: BLOOD RIGHT ARM  Result Value Ref Range Status   Specimen Description BLOOD RIGHT ARM  Final   Special Requests   Final    BOTTLES DRAWN AEROBIC AND ANAEROBIC Blood Culture adequate volume   Culture   Final    NO GROWTH 5 DAYS Performed at [facility], [address] [place], [place] [address]    Report Status  01/02/2023 FINAL  Final  Blood culture (routine x 2)     Status: Abnormal   Collection Time: 12/28/22  1:29 AM   Specimen: BLOOD LEFT HAND  Result Value Ref Range Status   Specimen Description BLOOD LEFT HAND  Final   Special Requests   Final    BOTTLES DRAWN AEROBIC ONLY Blood Culture adequate volume   Culture  Setup Time   Final    GRAM POSITIVE RODS AEROBIC BOTTLE ONLY CRITICAL RESULT CALLED TO, READ BACK BY AND VERIFIED WITH: V BRYK,PHARMD@0410  12/29/22 MK    Culture (A)  Final    CORYNEBACTERIUM SPECIES Standardized susceptibility testing for this organism is not available. Performed at [facility], [address] [place], [place] [address]    Report Status 12/31/2022 FINAL  Final  Aerobic/Anaerobic Culture w Gram Stain (surgical/deep wound)     Status:  None   Collection Time: 12/29/22  9:51 AM   Specimen: Joint, Other; Body Fluid  Result Value Ref Range Status   Specimen Description KNEE  Final   Special Requests RT KNEE ABSCESS  Final   Gram Stain   Final    ABUNDANT WBC PRESENT, PREDOMINANTLY PMN NO ORGANISMS SEEN    Culture   Final    RARE KLEBSIELLA PNEUMONIAE SUSCEPTIBILITIES PERFORMED ON PREVIOUS CULTURE WITHIN THE LAST 5 DAYS. NO ANAEROBES ISOLATED Performed at [facility], [address], [place], [place] [address]    Report Status 01/03/2023 FINAL  Final  Aerobic/Anaerobic Culture w Gram Stain (surgical/deep wound)     Status: None (Preliminary result)   Collection Time: 12/29/22  9:58 AM   Specimen: Joint, Other; Body Fluid  Result Value Ref Range Status   Specimen Description KNEE  Final   Special Requests B RT KNEE ABSCESS  Final   Gram Stain   Final    ABUNDANT WBC PRESENT, PREDOMINANTLY PMN NO ORGANISMS SEEN    Culture   Final    FEW KLEBSIELLA PNEUMONIAE Confirmed Extended Spectrum Beta-Lactamase Producer (ESBL).  In bloodstream infections from ESBL organisms, carbapenems are preferred over piperacillin/tazobactam. They are shown to  have a lower risk of mortality. NO ANAEROBES ISOLATED Sent to [facility] for further susceptibility testing. Performed at [facility], [address], [place], [place] [address]    Report Status PENDING  Incomplete   Organism ID, Bacteria KLEBSIELLA PNEUMONIAE  Final      Susceptibility   Klebsiella pneumoniae - MIC*    AMPICILLIN >=32 RESISTANT Resistant     CEFEPIME 2 SENSITIVE Sensitive     CEFTAZIDIME RESISTANT Resistant     CEFTRIAXONE >=64 RESISTANT Resistant     CIPROFLOXACIN 0.5 INTERMEDIATE Intermediate     GENTAMICIN <=1 SENSITIVE Sensitive     IMIPENEM <=0.25 SENSITIVE Sensitive     TRIMETH/SULFA >=320 RESISTANT Resistant     AMPICILLIN/SULBACTAM 4 SENSITIVE Sensitive     PIP/TAZO <=4 SENSITIVE Sensitive ug/mL    * FEW KLEBSIELLA PNEUMONIAE  Culture, blood (Routine X 2) w Reflex to ID Panel     Status: None (Preliminary result)   Collection Time: 12/30/22  3:49 PM   Specimen: BLOOD RIGHT ARM  Result Value Ref Range Status   Specimen Description BLOOD RIGHT ARM  Final   Special Requests   Final    BOTTLES DRAWN AEROBIC ONLY Blood Culture results may not be optimal due to an inadequate volume of blood received in culture bottles   Culture   Final    NO GROWTH 4 DAYS Performed at [facility], [address] [place], [place] [address]    Report Status PENDING  Incomplete  Culture, blood (Routine X 2) w Reflex to ID Panel     Status: None (Preliminary result)   Collection Time: 12/30/22  3:49 PM   Specimen: BLOOD RIGHT HAND  Result Value Ref Range Status   Specimen Description BLOOD RIGHT HAND  Final   Special Requests   Final    BOTTLES DRAWN AEROBIC ONLY Blood Culture adequate volume   Culture   Final    NO GROWTH 4 DAYS Performed at [facility], [address], [place], [place] [address]    Report Status PENDING  Incomplete         Radiology Studies: No results found.      Scheduled Meds:  cholecalciferol  1,000 Units Oral Daily  enoxaparin (LOVENOX) injection  40 mg Subcutaneous Q24H   folic acid  1 mg Oral Daily   lactulose  20 g Oral BID   metoprolol tartrate  100 mg Oral BID   midodrine  5 mg Oral TID WC   sodium chloride flush  3 mL Intravenous Q12H   thiamine  100 mg Oral Daily   Vitamin D (Ergocalciferol)  50,000 Units Oral Q7 days   Continuous Infusions:  ertapenem 1 g (01/04/23 1014)     LOS: 7 days     Huey Bienenstock, MD [facility]   To contact the attending provider between 7A-7P or the covering provider during after hours 7P-7A, please log into the web site [email or web address] and access using universal [place] password for that web site. If you do not have the password, please call the hospital operator.  01/04/2023, 12:07 PM

## 2023-01-04 NOTE — Progress Notes (Signed)
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 today FINDINGS: Proximal tibial fracture better seen on femur series, not fully imaged on this study. No visible fibular fracture. Ankle joint intact. IMPRESSION: Proximal tibial fracture, better seen on today's femur series. Electronically Signed   By: Charlett Nose M.D.   On: 12/12/2022 23:46     I have personally spent 57 minutes involved in face-to-face and non-face-to-face activities for this patient on the day of the visit. Professional time spent includes the following activities: Preparing to see the patient (review of tests), Obtaining and/or reviewing separately obtained history (admission/discharge record), Performing a medically appropriate examination and/or evaluation , Ordering medications/tests/procedures, referring and communicating with other health care professionals, Documenting clinical  information in the EMR, Independently interpreting results (not separately reported), Communicating results to the patient/family/caregiver, Counseling and educating the patient/family/caregiver and Care coordination (not separately reported).   Plan d/w requesting provider as well as ID pharm D  Of note, portions of this note may have been created with voice recognition software. While this note has been edited for accuracy, occasional wrong-word or 'sound-a-like' substitutions may have occurred due to the inherent limitations of voice recognition software.   Electronically signed by:   Odette Fraction, MD Infectious Disease Physician [facility] Pager: [phone]

## 2023-01-04 NOTE — Plan of Care (Signed)
  Problem: Education: Goal: Knowledge of disease or condition will improve Outcome: Progressing Goal: Understanding of medication regimen will improve Outcome: Progressing Goal: Individualized Educational Video(s) Outcome: Progressing   Problem: Activity: Goal: Ability to tolerate increased activity will improve Outcome: Progressing   Problem: Cardiac: Goal: Ability to achieve and maintain adequate cardiopulmonary perfusion will improve Outcome: Progressing   Problem: Health Behavior/Discharge Planning: Goal: Ability to safely manage health-related needs after discharge will improve Outcome: Progressing   Problem: Education: Goal: Knowledge of General Education information will improve Description: Including pain rating scale, medication(s)/side effects and non-pharmacologic comfort measures Outcome: Progressing   Problem: Health Behavior/Discharge Planning: Goal: Ability to manage health-related needs will improve Outcome: Progressing   Problem: Clinical Measurements: Goal: Ability to maintain clinical measurements within normal limits will improve Outcome: Progressing Goal: Will remain free from infection Outcome: Progressing Goal: Diagnostic test results will improve Outcome: Progressing Goal: Respiratory complications will improve Outcome: Progressing Goal: Cardiovascular complication will be avoided Outcome: Progressing   Problem: Activity: Goal: Risk for activity intolerance will decrease Outcome: Progressing   Problem: Nutrition: Goal: Adequate nutrition will be maintained Outcome: Progressing   Problem: Coping: Goal: Level of anxiety will decrease Outcome: Progressing   Problem: Elimination: Goal: Will not experience complications related to bowel motility Outcome: Progressing Goal: Will not experience complications related to urinary retention Outcome: Progressing   Problem: Pain Management: Goal: General experience of comfort will  improve Outcome: Progressing   Problem: Safety: Goal: Ability to remain free from injury will improve Outcome: Progressing   Problem: Skin Integrity: Goal: Risk for impaired skin integrity will decrease Outcome: Progressing

## 2023-01-05 DIAGNOSIS — T148XXA Other injury of unspecified body region, initial encounter: Secondary | ICD-10-CM

## 2023-01-05 DIAGNOSIS — I4891 Unspecified atrial fibrillation: Secondary | ICD-10-CM | POA: Diagnosis not present

## 2023-01-05 DIAGNOSIS — M00861 Arthritis due to other bacteria, right knee: Secondary | ICD-10-CM | POA: Diagnosis not present

## 2023-01-05 DIAGNOSIS — I10 Essential (primary) hypertension: Secondary | ICD-10-CM | POA: Diagnosis not present

## 2023-01-05 NOTE — Progress Notes (Signed)
Orthopaedic Trauma Progress Note  SUBJECTIVE: Doing ok. Continues to note pain is an issue. Has been mobilizing well with therapies.  Tolerating his antibiotics. No other specific concerns or complaints this morning.  Wound culture growing klebsiella pneumoniae, infectious disease team continuing to explore antibiotic options.  OBJECTIVE:  Vitals:   01/05/23 0900 01/05/23 1200  BP: 106/61 107/63  Pulse: (!) 109 87  Resp: 20 (!) 8  Temp: 98 F (36.7 C) 98.5 F (36.9 C)  SpO2: 90% 93%    General: Sitting up in bed, no acute distress Respiratory: No increased work of breathing.  Right lower extremity:  Swelling about the knee stable. Dressing changed, bandages with thick, mucus-like drainage. Incision also with sticky, mucus-like discharge. No surrounding redness. Ankle dorsiflexion/plantarflexion is intact. No significant increase in pain with passive stretch of the toes.  Global soreness/tenderness to the calf but no areas of significant increase in pain.  +EHL/FHL intact.  Neurovascularly intact.  IMAGING: Repeat imaging right knee performed 12/27/2022 shows stable appearance to plate and screw fixation.  Fracture appears stable.  No change in alignment.  No evidence of new fracture.  Moderate knee joint effusion.  LABS:  No results found for this or any previous visit (from the past 24 hour(s)).   ASSESSMENT: Jonathan Ibarra is a 57 y.o. male 1 day postop s/p IRRIGATION AND DEBRIDEMENT RIGHT KNEE   Previous ORIF of right tibial plateau fracture with repair of right lateral meniscus tear on 12/13/2022  PLAN: Weightbearing: NWB RLE ROM: Okay for range of motion of the knee as tolerated Incisional and dressing care: Dressing changed today. Continue daily dressing changes by nursing start 01/06/23 Showering: Hold off on showering for now Orthopedic device(s): None Pain management:  1. Tylenol 325 mg q 6 hours PRN 2. Robaxin 750 mg q 6 hours PRN 3. Oxycodone 5-10 mg q 4 hours  PRN 4. Toradol 30 mg every 6 hours x 3 days 5. Dilaudid 0.5-1 mg q 3 hours PRN VTE prophylaxis: Lovenox.  SCDs ID: Per infectious disease Foley/Lines:  No foley, KVO IVFs Impediments to Fracture Healing:  Infection.  Vitamin D level 28, continue supplementation Dispo: PT/OT as able. Due to active drug use, patient not a candidate for PICC line. Infectious disease following, appreciate their assistance with antibiotic management. Will plan to re-evaluate wound on Monday 01/10/23 to determine if additional I&D needed. Patient will ultimately need HW removal once fracture healed.   Follow - up plan: 2 weeks after d/c    Contact information:  Truitt Merle MD, Thyra Breed PA-C. After hours and holidays please check [email or web address] for group call information for [facility]   Thompson Caul, PA-C [phone] (office) [email or web address]

## 2023-01-05 NOTE — Progress Notes (Signed)
PROGRESS NOTE        PATIENT DETAILS Name: Jonathan Ibarra Age: 57 y.o. Sex: male Date of Birth: 1965-08-06 Admit Date: 12/27/2022 Admitting Physician Osvaldo Shipper, MD ZOX:[id number], [name], MD  Brief Summary: Patient is a 57 y.o.  male with history of liver cirrhosis, HTN, polysubstance abuse-recent hospitalization from 10/13-10/18 following trauma with fall resulting in a right tibia plateau fracture requiring ORIF on 10/14-presented with purulent discharge from his right knee-he was thought to have septic arthritis and subsequently admitted to the hospitalist service.  Significant events: 10/13-10/18 >> hospitalization-right tibial plateau fracture following a fall-s/p ORIF.   10/28>> purulent discharge from right knee-septic arthritis-admit to [facility]  Significant studies: 10/28>> x-ray right knee: Unchanged lateral plate/screw fixation across comminuted fracture involving medial/lateral tibial plateaus, moderate knee joint effusion 10/29>> CXR: No active disease  Significant microbiology data: 10/29>> blood culture: 1/2-Corynebacterium (contamination) 10/30>> right knee abscess culture: Klebsiella pneumoniae-ESBL 10/31>> blood culture: No growth  Procedures: 10/30>> irrigation and debridement of right knee  Consults: Ortho recs Infectious disease  Subjective: Lying comfortably in bed-denies any chest pain or shortness of breath.  Objective: Vitals: Blood pressure 106/61, pulse (!) 109, temperature 98 F (36.7 C), temperature source Oral, resp. rate 20, height 6' (1.829 m), weight 123.6 kg, SpO2 90%.   Exam: Gen Exam:Alert awake-not in any distress HEENT:atraumatic, normocephalic Chest: B/L clear to auscultation anteriorly CVS:S1S2 regular Abdomen:soft non tender, non distended Extremities:no edema Neurology: Non focal Skin: no rash  Pertinent Labs/Radiology:    Latest Ref Rng & Units 01/04/2023    4:30 AM 12/31/2022    3:20 AM  12/30/2022    4:44 AM  CBC  WBC 4.0 - 10.5 K/uL 4.1  6.3  7.4   Hemoglobin 13.0 - 17.0 g/dL 54.0  98.1  19.1   Hematocrit 39.0 - 52.0 % 32.3  32.1  35.6   Platelets 150 - 400 K/uL 226  313  371     Lab Results  Component Value Date   NA 132 (L) 01/04/2023   K 3.9 01/04/2023   CL 99 01/04/2023   CO2 28 01/04/2023      Assessment/Plan: Septic arthritis right knee (ESBL Klebsiella pneumoniae)-with recent history of ORIF on 10/14 with hardware in place S/p I&D 10/30-VAC discontinued 11/4 ID recommending Invanz-EOT 12/10-will need to call ID 1 or 2 days prior to EOT for final recommendations prior to discharge (may require suppressive antibiotic) Not a candidate for home IV therapies given history of drug use  PAF with RVR Rate controlled with oral metoprolol Not a candidate for anticoagulation-in any events CHADS2-VASC score of 1  Liver cirrhosis-s/p TIPS Relatively well compensated Continue lactulose Diuretics on hold due to soft BP (on midodrine for BP support)  Polysubstance abuse Acknowledges ongoing meth/THC use and remote cocaine use-although UDS positive for cocaine Ongoing counseling  Obesity: Estimated body mass index is 36.96 kg/m as calculated from the following:   Height as of this encounter: 6' (1.829 m).   Weight as of this encounter: 123.6 kg.   Code status:   Code Status: Full Code   DVT Prophylaxis: enoxaparin (LOVENOX) injection 40 mg Start: 12/30/22 0900 SCDs Start: 12/28/22 0816   Family Communication: None at bedside   Disposition Plan: Status is: Inpatient Remains inpatient appropriate because: Severity of illness   Planned Discharge Destination:Home   Diet: Diet Order  Diet regular Room service appropriate? Yes; Fluid consistency: Thin  Diet effective now                     Antimicrobial agents: Anti-infectives (From admission, onward)    Start     Dose/Rate Route Frequency Ordered Stop   12/31/22 1545   ertapenem (INVANZ) 1 g in sodium chloride 0.9 % 100 mL IVPB        1 g 200 mL/hr over 30 Minutes Intravenous Every 24 hours 12/31/22 1546     12/31/22 1100  ertapenem (INVANZ) 1 g in sodium chloride 0.9 % 100 mL IVPB  Status:  Discontinued        1 g 200 mL/hr over 30 Minutes Intravenous Every 24 hours 12/31/22 0931 12/31/22 1546   12/30/22 1400  cefTRIAXone (ROCEPHIN) 2 g in sodium chloride 0.9 % 100 mL IVPB  Status:  Discontinued        2 g 200 mL/hr over 30 Minutes Intravenous Every 24 hours 12/30/22 1056 12/31/22 0931   12/29/22 1000  tobramycin (NEBCIN) powder  Status:  Discontinued          As needed 12/29/22 1022 12/29/22 1035   12/29/22 1000  vancomycin (VANCOCIN) powder  Status:  Discontinued          As needed 12/29/22 1023 12/29/22 1035   12/28/22 1400  piperacillin-tazobactam (ZOSYN) IVPB 3.375 g  Status:  Discontinued        3.375 g 12.5 mL/hr over 240 Minutes Intravenous Every 8 hours 12/28/22 1202 12/30/22 1056   12/28/22 1300  vancomycin (VANCOCIN) IVPB 1000 mg/200 mL premix  Status:  Discontinued        1,000 mg 200 mL/hr over 60 Minutes Intravenous Every 8 hours 12/28/22 1202 12/31/22 1029   12/28/22 0900  doxycycline (VIBRAMYCIN) 100 mg in dextrose 5 % 250 mL IVPB  Status:  Discontinued        100 mg 125 mL/hr over 120 Minutes Intravenous Every 12 hours 12/28/22 0826 12/28/22 1141   12/28/22 0115  vancomycin (VANCOCIN) IVPB 1000 mg/200 mL premix  Status:  Discontinued        1,000 mg 200 mL/hr over 60 Minutes Intravenous  Once 12/28/22 0107 12/28/22 0108   12/28/22 0115  piperacillin-tazobactam (ZOSYN) IVPB 3.375 g        3.375 g 100 mL/hr over 30 Minutes Intravenous  Once 12/28/22 0107 12/28/22 0142   12/28/22 0115  vancomycin (VANCOREADY) IVPB 2000 mg/400 mL        2,000 mg 200 mL/hr over 120 Minutes Intravenous  Once 12/28/22 0108 12/28/22 0342   12/28/22 0100  doxycycline (VIBRA-TABS) tablet 100 mg        100 mg Oral  Once 12/28/22 0049 12/28/22 0054         MEDICATIONS: Scheduled Meds:  cholecalciferol  1,000 Units Oral Daily   enoxaparin (LOVENOX) injection  40 mg Subcutaneous Q24H   folic acid  1 mg Oral Daily   lactulose  20 g Oral BID   metoprolol tartrate  100 mg Oral BID   midodrine  5 mg Oral TID WC   sodium chloride flush  3 mL Intravenous Q12H   thiamine  100 mg Oral Daily   Vitamin D (Ergocalciferol)  50,000 Units Oral Q7 days   Continuous Infusions:  ertapenem 1 g (01/05/23 0949)   PRN Meds:.acetaminophen, diltiazem, HYDROmorphone (DILAUDID) injection, methocarbamol, oxyCODONE **OR** oxyCODONE, polyethylene glycol, sodium chloride flush   I have personally reviewed following  labs and imaging studies  LABORATORY DATA: CBC: Recent Labs  Lab 12/30/22 0444 12/31/22 0320 01/04/23 0430  WBC 7.4 6.3 4.1  HGB 12.0* 10.6* 11.0*  HCT 35.6* 32.1* 32.3*  MCV 90.8 90.7 89.7  PLT 371 313 226    Basic Metabolic Panel: Recent Labs  Lab 12/30/22 0444 12/31/22 0320 01/01/23 0449 01/04/23 0430  NA 131* 136 134* 132*  K 4.2 3.8 3.9 3.9  CL 100 103 101 99  CO2 24 27 28 28   GLUCOSE 93 83 125* 91  BUN 12 18 17 12   CREATININE 0.78 0.82 0.86 0.81  CALCIUM 8.6* 8.4* 8.5* 8.3*  MG 1.6*  --  1.8 1.6*  PHOS 2.7  --   --   --     GFR: Estimated Creatinine Clearance: 138.3 mL/min (by C-G formula based on SCr of 0.81 mg/dL).  Liver Function Tests: No results for input(s): "AST", "ALT", "ALKPHOS", "BILITOT", "PROT", "ALBUMIN" in the last 168 hours. No results for input(s): "LIPASE", "AMYLASE" in the last 168 hours. No results for input(s): "AMMONIA" in the last 168 hours.  Coagulation Profile: No results for input(s): "INR", "PROTIME" in the last 168 hours.  Cardiac Enzymes: No results for input(s): "CKTOTAL", "CKMB", "CKMBINDEX", "TROPONINI" in the last 168 hours.  BNP (last 3 results) No results for input(s): "PROBNP" in the last 8760 hours.  Lipid Profile: No results for input(s): "CHOL", "HDL", "LDLCALC",  "TRIG", "CHOLHDL", "LDLDIRECT" in the last 72 hours.  Thyroid Function Tests: No results for input(s): "TSH", "T4TOTAL", "FREET4", "T3FREE", "THYROIDAB" in the last 72 hours.  Anemia Panel: No results for input(s): "VITAMINB12", "FOLATE", "FERRITIN", "TIBC", "IRON", "RETICCTPCT" in the last 72 hours.  Urine analysis:    Component Value Date/Time   COLORURINE AMBER (A) 10/01/2022 0142   APPEARANCEUR CLOUDY (A) 10/01/2022 0142   LABSPEC 1.025 10/01/2022 0142   PHURINE 5.0 10/01/2022 0142   GLUCOSEU NEGATIVE 10/01/2022 0142   HGBUR SMALL (A) 10/01/2022 0142   BILIRUBINUR NEGATIVE 10/01/2022 0142   KETONESUR NEGATIVE 10/01/2022 0142   PROTEINUR 30 (A) 10/01/2022 0142   NITRITE NEGATIVE 10/01/2022 0142   LEUKOCYTESUR LARGE (A) 10/01/2022 0142    Sepsis Labs: Lactic Acid, Venous    Component Value Date/Time   LATICACIDVEN 1.5 12/12/2022 2311    MICROBIOLOGY: Recent Results (from the past 240 hour(s))  Blood culture (routine x 2)     Status: None   Collection Time: 12/28/22  1:28 AM   Specimen: BLOOD RIGHT ARM  Result Value Ref Range Status   Specimen Description BLOOD RIGHT ARM  Final   Special Requests   Final    BOTTLES DRAWN AEROBIC AND ANAEROBIC Blood Culture adequate volume   Culture   Final    NO GROWTH 5 DAYS Performed at [facility], [address] [place], [place] [address]    Report Status 01/02/2023 FINAL  Final  Blood culture (routine x 2)     Status: Abnormal   Collection Time: 12/28/22  1:29 AM   Specimen: BLOOD LEFT HAND  Result Value Ref Range Status   Specimen Description BLOOD LEFT HAND  Final   Special Requests   Final    BOTTLES DRAWN AEROBIC ONLY Blood Culture adequate volume   Culture  Setup Time   Final    GRAM POSITIVE RODS AEROBIC BOTTLE ONLY CRITICAL RESULT CALLED TO, READ BACK BY AND VERIFIED WITH: V BRYK,[email or web address]@[id number]  12/29/22 MK    Culture (A)  Final    CORYNEBACTERIUM SPECIES Standardized susceptibility testing for  this organism  is not available. Performed at [facility], [address] [place], [place] [address]    Report Status 12/31/2022 FINAL  Final  Aerobic/Anaerobic Culture w Gram Stain (surgical/deep wound)     Status: None   Collection Time: 12/29/22  9:51 AM   Specimen: Joint, Other; Body Fluid  Result Value Ref Range Status   Specimen Description KNEE  Final   Special Requests RT KNEE ABSCESS  Final   Gram Stain   Final    ABUNDANT WBC PRESENT, PREDOMINANTLY PMN NO ORGANISMS SEEN    Culture   Final    RARE KLEBSIELLA PNEUMONIAE SUSCEPTIBILITIES PERFORMED ON PREVIOUS CULTURE WITHIN THE LAST 5 DAYS. NO ANAEROBES ISOLATED Performed at [facility], [address], [place], [place] [address]    Report Status 01/03/2023 FINAL  Final  Aerobic/Anaerobic Culture w Gram Stain (surgical/deep wound)     Status: None (Preliminary result)   Collection Time: 12/29/22  9:58 AM   Specimen: Joint, Other; Body Fluid  Result Value Ref Range Status   Specimen Description KNEE  Final   Special Requests B RT KNEE ABSCESS  Final   Gram Stain   Final    ABUNDANT WBC PRESENT, PREDOMINANTLY PMN NO ORGANISMS SEEN    Culture   Final    FEW KLEBSIELLA PNEUMONIAE Confirmed Extended Spectrum Beta-Lactamase Producer (ESBL).  In bloodstream infections from ESBL organisms, carbapenems are preferred over piperacillin/tazobactam. They are shown to have a lower risk of mortality. NO ANAEROBES ISOLATED Sent to [facility] for further susceptibility testing. Performed at [facility], [address], [place], [place] [address]    Report Status PENDING  Incomplete   Organism ID, Bacteria KLEBSIELLA PNEUMONIAE  Final      Susceptibility   Klebsiella pneumoniae - MIC*    AMPICILLIN >=32 RESISTANT Resistant     CEFEPIME 2 SENSITIVE Sensitive     CEFTAZIDIME RESISTANT Resistant     CEFTRIAXONE >=64 RESISTANT Resistant     CIPROFLOXACIN 0.5 INTERMEDIATE Intermediate     GENTAMICIN <=1 SENSITIVE Sensitive      IMIPENEM <=0.25 SENSITIVE Sensitive     TRIMETH/SULFA >=320 RESISTANT Resistant     AMPICILLIN/SULBACTAM 4 SENSITIVE Sensitive     PIP/TAZO <=4 SENSITIVE Sensitive ug/mL    * FEW KLEBSIELLA PNEUMONIAE  Culture, blood (Routine X 2) w Reflex to ID Panel     Status: None   Collection Time: 12/30/22  3:49 PM   Specimen: BLOOD RIGHT ARM  Result Value Ref Range Status   Specimen Description BLOOD RIGHT ARM  Final   Special Requests   Final    BOTTLES DRAWN AEROBIC ONLY Blood Culture results may not be optimal due to an inadequate volume of blood received in culture bottles   Culture   Final    NO GROWTH 5 DAYS Performed at [facility], [address] [place], [place] [address]    Report Status 01/04/2023 FINAL  Final  Culture, blood (Routine X 2) w Reflex to ID Panel     Status: None   Collection Time: 12/30/22  3:49 PM   Specimen: BLOOD RIGHT HAND  Result Value Ref Range Status   Specimen Description BLOOD RIGHT HAND  Final   Special Requests   Final    BOTTLES DRAWN AEROBIC ONLY Blood Culture adequate volume   Culture   Final    NO GROWTH 5 DAYS Performed at [facility], [address] [place], [place] [address]  Report Status 01/04/2023 FINAL  Final    RADIOLOGY STUDIES/RESULTS: No results found.   LOS: 8 days   Jeoffrey Massed, MD  [facility]    To contact the attending provider between 7A-7P or the covering provider during after hours 7P-7A, please log into the web site [email or web address] and access using universal [place] password for that web site. If you do not have the password, please call the hospital operator.  01/05/2023, 11:55 AM

## 2023-01-05 NOTE — Plan of Care (Signed)
  Problem: Education: Goal: Knowledge of disease or condition will improve Outcome: Progressing Goal: Understanding of medication regimen will improve Outcome: Progressing Goal: Individualized Educational Video(s) Outcome: Progressing   Problem: Activity: Goal: Ability to tolerate increased activity will improve Outcome: Progressing   Problem: Cardiac: Goal: Ability to achieve and maintain adequate cardiopulmonary perfusion will improve Outcome: Progressing   Problem: Health Behavior/Discharge Planning: Goal: Ability to safely manage health-related needs after discharge will improve Outcome: Progressing   Problem: Education: Goal: Knowledge of General Education information will improve Description: Including pain rating scale, medication(s)/side effects and non-pharmacologic comfort measures Outcome: Progressing   Problem: Health Behavior/Discharge Planning: Goal: Ability to manage health-related needs will improve Outcome: Progressing   Problem: Clinical Measurements: Goal: Ability to maintain clinical measurements within normal limits will improve Outcome: Progressing Goal: Will remain free from infection Outcome: Progressing Goal: Diagnostic test results will improve Outcome: Progressing Goal: Respiratory complications will improve Outcome: Progressing Goal: Cardiovascular complication will be avoided Outcome: Progressing   Problem: Activity: Goal: Risk for activity intolerance will decrease Outcome: Progressing   Problem: Nutrition: Goal: Adequate nutrition will be maintained Outcome: Progressing   Problem: Coping: Goal: Level of anxiety will decrease Outcome: Progressing   Problem: Elimination: Goal: Will not experience complications related to bowel motility Outcome: Progressing Goal: Will not experience complications related to urinary retention Outcome: Progressing   Problem: Pain Management: Goal: General experience of comfort will  improve Outcome: Progressing   Problem: Safety: Goal: Ability to remain free from injury will improve Outcome: Progressing   Problem: Skin Integrity: Goal: Risk for impaired skin integrity will decrease Outcome: Progressing

## 2023-01-05 NOTE — Progress Notes (Signed)
Physical Therapy Treatment Patient Details Name: Jonathan Ibarra MRN: [id number] DOB: 09-13-1965 Today's Date: 01/05/2023   History of Present Illness Pt is a 57 y/o male presenting 10/28 to [facility] with severe right knee pain and drainage after a fall the night PTA, he is now s/p R knee I&D on 12/29/22. He recently had a R tibial ORIF on 12/13/22 after a falls incidence. WUJ:WJXBJYN abuse, [name], HTN, liver cirrhosis, polysubstance abuse.    PT Comments  Pt with fair tolerance to treatment today. Pt initially tried to refuse therapy however after encouragement was agreeable to stand at sink to rinse mouth and have bed linen changed. Pt remains CGA for all mobility however did require several cues to maintain NWB precautions today as pt was noted to be TDWB several times. No change in DC/DME recs at this time. PT will continue to follow.    If plan is discharge home, [name];[name];Assistance with cooking/housework;Assist for transportation;Help with stairs or ramp for entrance   Can travel by private vehicle        Equipment Recommendations  None recommended by PT    Recommendations for Other Services       Precautions / Restrictions Precautions Precautions: Fall Restrictions Weight Bearing Restrictions: Yes RLE Weight Bearing: Non weight bearing     Mobility  Bed Mobility Overal bed mobility: Needs Assistance Bed Mobility: Supine to Sit, Sit to Supine     Supine to sit: Contact guard, [name], Used rails Sit to supine: Contact guard assist, [name], Used rails   General bed mobility comments: Increased time. Pt able to use LLE to assist RLE.    Transfers Overall transfer level: Needs assistance Equipment used: Rolling walker (2 wheels) Transfers: Sit to/from Stand Sit to Stand: Contact guard assist           General transfer comment: Cues for hand placement  and WB precautions.    Ambulation/Gait Ambulation/Gait assistance: Contact guard assist Gait Distance (Feet): 10 Feet Assistive device: Rolling walker (2 wheels) Gait Pattern/deviations: Trunk flexed (hop to pattern) Gait velocity: decreased     General Gait Details: Pt required multiple cues for WB precautions today. Pt stood at sink with rinse mouth however was noted to be TDWB through RLE.   Stairs             Wheelchair Mobility     Tilt Bed    Modified Rankin (Stroke Patients Only)       Balance Overall balance assessment: Needs assistance Sitting-balance support: No upper extremity supported, Feet supported Sitting balance-Leahy Scale: Good Sitting balance - Comments: sitting EOB   Standing balance support: Bilateral upper extremity supported, During functional activity, Reliant on assistive device for balance Standing balance-Leahy Scale: Poor Standing balance comment: reliant on RW support                            Cognition Arousal: Alert Behavior During Therapy: WFL for tasks assessed/performed Overall Cognitive Status: Within Functional Limits for tasks assessed                                 General Comments: Requires cues for safety due to decreased awareness of deficits        Exercises      General Comments General comments (skin integrity, edema, etc.):  VSS      Pertinent Vitals/Pain Pain Assessment Pain Assessment: Faces Faces Pain Scale: Hurts even more Pain Location: R knee Pain Descriptors / Indicators: Discomfort, Aching, Moaning Pain Intervention(s): Limited activity within patient's tolerance, Monitored during session    Home Living                          Prior Function            PT Goals (current goals can now be found in the care plan section) Progress towards PT goals: Progressing toward goals    Frequency    Min 1X/week      PT Plan      Co-evaluation               AM-PAC PT "6 Clicks" Mobility   Outcome Measure  Help needed turning from your back to your side while in a flat bed without using bedrails?: None Help needed moving from lying on your back to sitting on the side of a flat bed without using bedrails?: A Little Help needed moving to and from a bed to a chair (including a wheelchair)?: A Little Help needed standing up from a chair using your arms (e.g., wheelchair or bedside chair)?: A Little Help needed to walk in hospital room?: A Little Help needed climbing 3-5 steps with a railing? : Total 6 Click Score: 17    End of Session Equipment Utilized During Treatment: Gait belt Activity Tolerance: Patient tolerated treatment well;Patient limited by pain Patient left: in bed;with call bell/phone within reach Nurse Communication: Mobility status PT Visit Diagnosis: Muscle weakness (generalized) (M62.81);Other abnormalities of gait and mobility (R26.89)     Time: 5409-8119 PT Time Calculation (min) (ACUTE ONLY): 24 min  Charges:    $Gait Training: 8-22 mins $Therapeutic Activity: 8-22 mins PT General Charges $$ ACUTE PT VISIT: 1 Visit                     Jonathan Ibarra, PT, DPT Acute Rehab Services [phone]    Jonathan Ibarra 01/05/2023, 3:15 PM

## 2023-01-06 DIAGNOSIS — I4891 Unspecified atrial fibrillation: Secondary | ICD-10-CM | POA: Diagnosis not present

## 2023-01-06 DIAGNOSIS — M009 Pyogenic arthritis, unspecified: Secondary | ICD-10-CM | POA: Diagnosis not present

## 2023-01-06 DIAGNOSIS — I480 Paroxysmal atrial fibrillation: Secondary | ICD-10-CM | POA: Diagnosis not present

## 2023-01-06 DIAGNOSIS — I10 Essential (primary) hypertension: Secondary | ICD-10-CM | POA: Diagnosis not present

## 2023-01-06 LAB — CBC
HCT: 31.3 % — ABNORMAL LOW (ref 39.0–52.0)
Hemoglobin: 10.5 g/dL — ABNORMAL LOW (ref 13.0–17.0)
MCH: 29.9 pg (ref 26.0–34.0)
MCHC: 33.5 g/dL (ref 30.0–36.0)
MCV: 89.2 fL (ref 80.0–100.0)
Platelets: 236 10*3/uL (ref 150–400)
RBC: 3.51 MIL/uL — ABNORMAL LOW (ref 4.22–5.81)
RDW: 14.5 % (ref 11.5–15.5)
WBC: 5 10*3/uL (ref 4.0–10.5)
nRBC: 0 % (ref 0.0–0.2)

## 2023-01-06 LAB — COMPREHENSIVE METABOLIC PANEL
ALT: 10 U/L (ref 0–44)
AST: 24 U/L (ref 15–41)
Albumin: 1.9 g/dL — ABNORMAL LOW (ref 3.5–5.0)
Alkaline Phosphatase: 106 U/L (ref 38–126)
Anion gap: 5 (ref 5–15)
BUN: 13 mg/dL (ref 6–20)
CO2: 27 mmol/L (ref 22–32)
Calcium: 8.5 mg/dL — ABNORMAL LOW (ref 8.9–10.3)
Chloride: 102 mmol/L (ref 98–111)
Creatinine, Ser: 0.69 mg/dL (ref 0.61–1.24)
GFR, Estimated: >60 mL/min (ref >60)
Glucose, Bld: 94 mg/dL (ref 70–99)
Potassium: 4.2 mmol/L (ref 3.5–5.1)
Sodium: 134 mmol/L — ABNORMAL LOW (ref 135–145)
Total Bilirubin: 1 mg/dL (ref <1.2)
Total Protein: 6.1 g/dL — ABNORMAL LOW (ref 6.5–8.1)

## 2023-01-06 MED ORDER — LACTULOSE ENEMA: 300.0000 mL | Freq: Every day | ORAL | Status: DC | PRN

## 2023-01-06 MED ORDER — BISACODYL 10 MG RE SUPP: 10.0000 mg | Freq: Every day | RECTAL | Status: DC | PRN

## 2023-01-06 MED ORDER — LACTULOSE 10 GM/15ML PO SOLN
30.0000 g | Freq: Three times a day (TID) | ORAL | Status: DC
  Administered 2023-01-06 – 2023-01-18 (×21): 30 g via ORAL
  Administered 2023-01-18: 20 g via ORAL
  Administered 2023-01-19 – 2023-02-04 (×23): 30 g via ORAL
  Filled 2023-01-06 (×64): qty 60

## 2023-01-06 NOTE — Plan of Care (Signed)
  Problem: Education: Goal: Knowledge of disease or condition will improve Outcome: Progressing Goal: Understanding of medication regimen will improve Outcome: Progressing Goal: Individualized Educational Video(s) Outcome: Progressing   Problem: Activity: Goal: Ability to tolerate increased activity will improve Outcome: Progressing   Problem: Cardiac: Goal: Ability to achieve and maintain adequate cardiopulmonary perfusion will improve Outcome: Progressing   Problem: Health Behavior/Discharge Planning: Goal: Ability to safely manage health-related needs after discharge will improve Outcome: Progressing   Problem: Education: Goal: Knowledge of General Education information will improve Description: Including pain rating scale, medication(s)/side effects and non-pharmacologic comfort measures Outcome: Progressing   Problem: Health Behavior/Discharge Planning: Goal: Ability to manage health-related needs will improve Outcome: Progressing   Problem: Clinical Measurements: Goal: Ability to maintain clinical measurements within normal limits will improve Outcome: Progressing Goal: Will remain free from infection Outcome: Progressing Goal: Diagnostic test results will improve Outcome: Progressing Goal: Respiratory complications will improve Outcome: Progressing Goal: Cardiovascular complication will be avoided Outcome: Progressing   Problem: Activity: Goal: Risk for activity intolerance will decrease Outcome: Progressing   Problem: Nutrition: Goal: Adequate nutrition will be maintained Outcome: Progressing   Problem: Coping: Goal: Level of anxiety will decrease Outcome: Progressing   Problem: Elimination: Goal: Will not experience complications related to bowel motility Outcome: Progressing Goal: Will not experience complications related to urinary retention Outcome: Progressing   Problem: Pain Management: Goal: General experience of comfort will  improve Outcome: Progressing   Problem: Safety: Goal: Ability to remain free from injury will improve Outcome: Progressing   Problem: Skin Integrity: Goal: Risk for impaired skin integrity will decrease Outcome: Progressing

## 2023-01-06 NOTE — Progress Notes (Signed)
PROGRESS NOTE        PATIENT DETAILS Name: Jonathan Ibarra Age: 57 y.o. Sex: male Date of Birth: [birth year] Admit Date: 12/27/2022 Admitting Physician Osvaldo Shipper, MD [id number], [name], MD  Brief Summary: Patient is a 57 y.o.  male with history of liver cirrhosis, HTN, polysubstance abuse-recent hospitalization from 10/13-10/18 following trauma with fall resulting in a right tibia plateau fracture requiring ORIF on 10/14-presented with purulent discharge from his right knee-he was thought to have septic arthritis and subsequently admitted to the hospitalist service.  Significant events: 10/13-10/18 >> hospitalization-right tibial plateau fracture following a fall-s/p ORIF.   10/28>> purulent discharge from right knee-septic arthritis-admit to [facility]  Significant studies: 08/02>> echo: EF 55-60% 10/28>> x-ray right knee: Unchanged lateral plate/screw fixation across comminuted fracture involving medial/lateral tibial plateaus, moderate knee joint effusion 10/29>> CXR: No active disease  Significant microbiology data: 10/29>> blood culture: 1/2-Corynebacterium (contamination) 10/30>> right knee abscess culture: Klebsiella pneumoniae-ESBL 10/31>> blood culture: No growth  Procedures: 10/30>> irrigation and debridement of right knee  Consults: Ortho recs Infectious disease  Subjective: Lying comfortably in bed-no major issues overnight.  Has not had a BM in 2 days.  Objective: Vitals: Blood pressure (!) 108/56, pulse 81, temperature 98.6 F (37 C), temperature source Oral, resp. rate 18, height 6' (1.829 m), weight 123.6 kg, SpO2 96%.   Exam: Gen Exam:Alert awake-not in any distress HEENT:atraumatic, normocephalic Chest: B/L clear to auscultation anteriorly CVS:S1S2 regular Abdomen:soft non tender, non distended Extremities:no edema Neurology: Non focal Skin: no rash  Pertinent Labs/Radiology:    Latest Ref Rng & Units 01/06/2023     4:51 AM 01/04/2023    4:30 AM 12/31/2022    3:20 AM  CBC  WBC 4.0 - 10.5 K/uL 5.0  4.1  6.3   Hemoglobin 13.0 - 17.0 g/dL 56.3  87.5  64.3   Hematocrit 39.0 - 52.0 % 31.3  32.3  32.1   Platelets 150 - 400 K/uL 236  226  313     Lab Results  Component Value Date   NA 134 (L) 01/06/2023   K 4.2 01/06/2023   CL 102 01/06/2023   CO2 27 01/06/2023      Assessment/Plan: Septic arthritis right knee (ESBL Klebsiella pneumoniae)-with recent history of ORIF on 10/14 with hardware in place S/p I&D 10/30-VAC discontinued 11/4 ID recommending Invanz-EOT 12/10-will need to call ID 1 or 2 days prior to EOT for final recommendations prior to discharge (may require suppressive antibiotic) Not a candidate for home IV therapies given history of drug use Ortho following-WBAT as tolerated  PAF with RVR Rate controlled with oral metoprolol Not a candidate for anticoagulation-in any events CHADS2-VASC score of 1  Liver cirrhosis-s/p TIPS Relatively well compensated No BM in 2 days-change lactulose to 30 g 3 times daily. Diuretics on hold due to soft BP (on midodrine for BP support)  Polysubstance abuse Acknowledges ongoing meth/THC use and remote cocaine use-although UDS positive for cocaine Ongoing counseling  Obesity: Estimated body mass index is 36.96 kg/m as calculated from the following:   Height as of this encounter: 6' (1.829 m).   Weight as of this encounter: 123.6 kg.   Code status:   Code Status: Full Code   DVT Prophylaxis: enoxaparin (LOVENOX) injection 40 mg Start: 12/30/22 0900 SCDs Start: 12/28/22 0816   Family Communication: None at bedside   Disposition  Plan: Status is: Inpatient Remains inpatient appropriate because: Severity of illness   Planned Discharge Destination:Home   Diet: Diet Order             Diet regular Room service appropriate? Yes; Fluid consistency: Thin  Diet effective now                     Antimicrobial agents: Anti-infectives  (From admission, onward)    Start     Dose/Rate Route Frequency Ordered Stop   12/31/22 1545  ertapenem (INVANZ) 1 g in sodium chloride 0.9 % 100 mL IVPB        1 g 200 mL/hr over 30 Minutes Intravenous Every 24 hours 12/31/22 1546     12/31/22 1100  ertapenem (INVANZ) 1 g in sodium chloride 0.9 % 100 mL IVPB  Status:  Discontinued        1 g 200 mL/hr over 30 Minutes Intravenous Every 24 hours 12/31/22 0931 12/31/22 1546   12/30/22 1400  cefTRIAXone (ROCEPHIN) 2 g in sodium chloride 0.9 % 100 mL IVPB  Status:  Discontinued        2 g 200 mL/hr over 30 Minutes Intravenous Every 24 hours 12/30/22 1056 12/31/22 0931   12/29/22 1000  tobramycin (NEBCIN) powder  Status:  Discontinued          As needed 12/29/22 1022 12/29/22 1035   12/29/22 1000  vancomycin (VANCOCIN) powder  Status:  Discontinued          As needed 12/29/22 1023 12/29/22 1035   12/28/22 1400  piperacillin-tazobactam (ZOSYN) IVPB 3.375 g  Status:  Discontinued        3.375 g 12.5 mL/hr over 240 Minutes Intravenous Every 8 hours 12/28/22 1202 12/30/22 1056   12/28/22 1300  vancomycin (VANCOCIN) IVPB 1000 mg/200 mL premix  Status:  Discontinued        1,000 mg 200 mL/hr over 60 Minutes Intravenous Every 8 hours 12/28/22 1202 12/31/22 1029   12/28/22 0900  doxycycline (VIBRAMYCIN) 100 mg in dextrose 5 % 250 mL IVPB  Status:  Discontinued        100 mg 125 mL/hr over 120 Minutes Intravenous Every 12 hours 12/28/22 0826 12/28/22 1141   12/28/22 0115  vancomycin (VANCOCIN) IVPB 1000 mg/200 mL premix  Status:  Discontinued        1,000 mg 200 mL/hr over 60 Minutes Intravenous  Once 12/28/22 0107 12/28/22 0108   12/28/22 0115  piperacillin-tazobactam (ZOSYN) IVPB 3.375 g        3.375 g 100 mL/hr over 30 Minutes Intravenous  Once 12/28/22 0107 12/28/22 0142   12/28/22 0115  vancomycin (VANCOREADY) IVPB 2000 mg/400 mL        2,000 mg 200 mL/hr over 120 Minutes Intravenous  Once 12/28/22 0108 12/28/22 0342   12/28/22 0100   doxycycline (VIBRA-TABS) tablet 100 mg        100 mg Oral  Once 12/28/22 0049 12/28/22 0054        MEDICATIONS: Scheduled Meds:  cholecalciferol  1,000 Units Oral Daily   enoxaparin (LOVENOX) injection  40 mg Subcutaneous Q24H   folic acid  1 mg Oral Daily   lactulose  30 g Oral TID   metoprolol tartrate  100 mg Oral BID   midodrine  5 mg Oral TID WC   sodium chloride flush  3 mL Intravenous Q12H   thiamine  100 mg Oral Daily   Vitamin D (Ergocalciferol)  50,000 Units Oral Q7 days  Continuous Infusions:  ertapenem 1 g (01/06/23 0930)   PRN Meds:.acetaminophen, diltiazem, HYDROmorphone (DILAUDID) injection, methocarbamol, oxyCODONE **OR** oxyCODONE, polyethylene glycol, sodium chloride flush   I have personally reviewed following labs and imaging studies  LABORATORY DATA: CBC: Recent Labs  Lab 12/31/22 0320 01/04/23 0430 01/06/23 0451  WBC 6.3 4.1 5.0  HGB 10.6* 11.0* 10.5*  HCT 32.1* 32.3* 31.3*  MCV 90.7 89.7 89.2  PLT 313 226 236    Basic Metabolic Panel: Recent Labs  Lab 12/31/22 0320 01/01/23 0449 01/04/23 0430 01/06/23 0451  NA 136 134* 132* 134*  K 3.8 3.9 3.9 4.2  CL 103 101 99 102  CO2 27 28 28 27   GLUCOSE 83 125* 91 94  BUN 18 17 12 13   CREATININE 0.82 0.86 0.81 0.69  CALCIUM 8.4* 8.5* 8.3* 8.5*  MG  --  1.8 1.6*  --     GFR: Estimated Creatinine Clearance: 140 mL/min (by C-G formula based on SCr of 0.69 mg/dL).  Liver Function Tests: Recent Labs  Lab 01/06/23 0451  AST 24  ALT 10  ALKPHOS 106  BILITOT 1.0  PROT 6.1*  ALBUMIN 1.9*   No results for input(s): "LIPASE", "AMYLASE" in the last 168 hours. No results for input(s): "AMMONIA" in the last 168 hours.  Coagulation Profile: No results for input(s): "INR", "PROTIME" in the last 168 hours.  Cardiac Enzymes: No results for input(s): "CKTOTAL", "CKMB", "CKMBINDEX", "TROPONINI" in the last 168 hours.  BNP (last 3 results) No results for input(s): "PROBNP" in the last 8760  hours.  Lipid Profile: No results for input(s): "CHOL", "HDL", "LDLCALC", "TRIG", "CHOLHDL", "LDLDIRECT" in the last 72 hours.  Thyroid Function Tests: No results for input(s): "TSH", "T4TOTAL", "FREET4", "T3FREE", "THYROIDAB" in the last 72 hours.  Anemia Panel: No results for input(s): "VITAMINB12", "FOLATE", "FERRITIN", "TIBC", "IRON", "RETICCTPCT" in the last 72 hours.  Urine analysis:    Component Value Date/Time   COLORURINE AMBER (A) 10/01/2022 0142   APPEARANCEUR CLOUDY (A) 10/01/2022 0142   LABSPEC 1.025 10/01/2022 0142   PHURINE 5.0 10/01/2022 0142   GLUCOSEU NEGATIVE 10/01/2022 0142   HGBUR SMALL (A) 10/01/2022 0142   BILIRUBINUR NEGATIVE 10/01/2022 0142   KETONESUR NEGATIVE 10/01/2022 0142   PROTEINUR 30 (A) 10/01/2022 0142   NITRITE NEGATIVE 10/01/2022 0142   LEUKOCYTESUR LARGE (A) 10/01/2022 0142    Sepsis Labs: Lactic Acid, Venous    Component Value Date/Time   LATICACIDVEN 1.5 12/12/2022 2311    MICROBIOLOGY: Recent Results (from the past 240 hour(s))  Blood culture (routine x 2)     Status: None   Collection Time: 12/28/22  1:28 AM   Specimen: BLOOD RIGHT ARM  Result Value Ref Range Status   Specimen Description BLOOD RIGHT ARM  Final   Special Requests   Final    BOTTLES DRAWN AEROBIC AND ANAEROBIC Blood Culture adequate volume   Culture   Final    NO GROWTH 5 DAYS Performed at [facility], [address] [place], [place] [address]    Report Status 01/02/2023 FINAL  Final  Blood culture (routine x 2)     Status: Abnormal   Collection Time: 12/28/22  1:29 AM   Specimen: BLOOD LEFT HAND  Result Value Ref Range Status   Specimen Description BLOOD LEFT HAND  Final   Special Requests   Final    BOTTLES DRAWN AEROBIC ONLY Blood Culture adequate volume   Culture  Setup Time   Final    GRAM POSITIVE RODS  AEROBIC BOTTLE ONLY CRITICAL RESULT CALLED TO, READ BACK BY AND VERIFIED WITH: V BRYK,[email or web address]@[id number]  12/29/22 MK    Culture (A)  Final     CORYNEBACTERIUM SPECIES Standardized susceptibility testing for this organism is not available. Performed at [facility], [address] [place], [place] [address]    Report Status 12/31/2022 FINAL  Final  Aerobic/Anaerobic Culture w Gram Stain (surgical/deep wound)     Status: None   Collection Time: 12/29/22  9:51 AM   Specimen: Joint, Other; Body Fluid  Result Value Ref Range Status   Specimen Description KNEE  Final   Special Requests RT KNEE ABSCESS  Final   Gram Stain   Final    ABUNDANT WBC PRESENT, PREDOMINANTLY PMN NO ORGANISMS SEEN    Culture   Final    RARE KLEBSIELLA PNEUMONIAE SUSCEPTIBILITIES PERFORMED ON PREVIOUS CULTURE WITHIN THE LAST 5 DAYS. NO ANAEROBES ISOLATED Performed at [facility], [address], [place], [place] [address]    Report Status 01/03/2023 FINAL  Final  Aerobic/Anaerobic Culture w Gram Stain (surgical/deep wound)     Status: None (Preliminary result)   Collection Time: 12/29/22  9:58 AM   Specimen: Joint, Other; Body Fluid  Result Value Ref Range Status   Specimen Description KNEE  Final   Special Requests B RT KNEE ABSCESS  Final   Gram Stain   Final    ABUNDANT WBC PRESENT, PREDOMINANTLY PMN NO ORGANISMS SEEN    Culture   Final    FEW KLEBSIELLA PNEUMONIAE Confirmed Extended Spectrum Beta-Lactamase Producer (ESBL).  In bloodstream infections from ESBL organisms, carbapenems are preferred over piperacillin/tazobactam. They are shown to have a lower risk of mortality. NO ANAEROBES ISOLATED Sent to [facility] for further susceptibility testing. Performed at [facility], [address] [place], [place] [address]    Report Status PENDING  Incomplete   Organism ID, Bacteria KLEBSIELLA PNEUMONIAE  Final      Susceptibility   Klebsiella pneumoniae - MIC*    AMPICILLIN >=32 RESISTANT Resistant     CEFEPIME 2 SENSITIVE Sensitive     CEFTAZIDIME RESISTANT Resistant     CEFTRIAXONE >=64 RESISTANT Resistant     CIPROFLOXACIN  0.5 INTERMEDIATE Intermediate     GENTAMICIN <=1 SENSITIVE Sensitive     IMIPENEM <=0.25 SENSITIVE Sensitive     TRIMETH/SULFA >=320 RESISTANT Resistant     AMPICILLIN/SULBACTAM 4 SENSITIVE Sensitive     PIP/TAZO <=4 SENSITIVE Sensitive ug/mL    * FEW KLEBSIELLA PNEUMONIAE  Culture, blood (Routine X 2) w Reflex to ID Panel     Status: None   Collection Time: 12/30/22  3:49 PM   Specimen: BLOOD RIGHT ARM  Result Value Ref Range Status   Specimen Description BLOOD RIGHT ARM  Final   Special Requests   Final    BOTTLES DRAWN AEROBIC ONLY Blood Culture results may not be optimal due to an inadequate volume of blood received in culture bottles   Culture   Final    NO GROWTH 5 DAYS Performed at [facility], [address], [place], [place] [address]    Report Status 01/04/2023 FINAL  Final  Culture, blood (Routine X 2) w Reflex to ID Panel     Status: None   Collection Time: 12/30/22  3:49 PM   Specimen: BLOOD RIGHT HAND  Result Value Ref Range Status   Specimen Description BLOOD RIGHT HAND  Final   Special Requests   Final    BOTTLES  DRAWN AEROBIC ONLY Blood Culture adequate volume   Culture   Final    NO GROWTH 5 DAYS Performed at [facility], [address], [place], [place] [address]    Report Status 01/04/2023 FINAL  Final    RADIOLOGY STUDIES/RESULTS: No results found.   LOS: 9 days   Jeoffrey Massed, MD  [facility]    To contact the attending provider between 7A-7P or the covering provider during after hours 7P-7A, please log into the web site [email or web address] and access using universal [employer] password for that web site. If you do not have the password, please call the hospital operator.  01/06/2023, 10:27 AM

## 2023-01-06 NOTE — Progress Notes (Signed)
Occupational Therapy Treatment Patient Details Name: Jonathan Ibarra MRN: [id number] DOB: 15-Nov-1965 Today's Date: 01/06/2023   History of present illness Pt is a 57 y/o male presenting 10/28 to [facility] with severe right knee pain and drainage after a fall the night PTA, he is now s/p R knee I&D on 12/29/22. He recently had a R tibial ORIF on 12/13/22 after a falls incidence. YQM:VHQIONG abuse, [name], HTN, liver cirrhosis, polysubstance abuse.   OT comments  Pt c/o pain 7/10 with movement of R knee, increases with OOB activities. Pt able to don pants min A for assistance with RLE, significantly increased time at bed level using lateral leans, good strength for bed mobility scooting as needed. Pt assisted to restroom CGA using RW, able to use TTWB for RLE, did not put any pressure to RLE, increased effort but no LOB during ambulation around room. Pt able to complete toileting hygiene as needed. Pt has difficulty with mobility and LB dressing due to pain but overall doing well, motivated to maintain independence. HHOT still recommended, will continue to follow acutely.       If plan is discharge home, recommend the following:  Assistance with cooking/housework;Help with stairs or ramp for entrance;Assist for transportation;[name];A little help with walking and/or transfers   Equipment Recommendations  Tub/shower bench    Recommendations for Other Services      Precautions / Restrictions Precautions Precautions: Fall Restrictions Weight Bearing Restrictions: Yes RLE Weight Bearing: Non weight bearing       Mobility Bed Mobility Overal bed mobility: Needs Assistance Bed Mobility: Supine to Sit, Sit to Supine     Supine to sit: Min assist, [name] Sit to supine: Contact guard assist   General bed mobility comments: increased time, some help with RLE for supine to sit    Transfers Overall transfer level: Needs assistance Equipment used:  Rolling walker (2 wheels) Transfers: Sit to/from Stand, Bed to chair/wheelchair/BSC Sit to Stand: Contact guard assist     Step pivot transfers: Contact guard assist     General transfer comment: CGA for transfer to restroom     Balance                                           ADL either performed or assessed with clinical judgement   ADL Overall ADL's : Needs assistance/impaired Eating/Feeding: Independent   Grooming: Set up;Sitting           Upper Body Dressing : Set up;Sitting   Lower Body Dressing: Moderate assistance;Sit to/from stand   Toilet Transfer: Contact guard assist;Ambulation;Rolling walker (2 wheels)   Toileting- Clothing Manipulation and Hygiene: Modified independent;Sitting/lateral lean       Functional mobility during ADLs: Contact guard assist;Rolling walker (2 wheels) General ADL Comments: Pt able to don pants with mod A in bed using lateral leans. assisted to restroom with CGA using RW, able to manage hygiene mod I    Extremity/Trunk Assessment Upper Extremity Assessment Upper Extremity Assessment: Overall WFL for tasks assessed            Vision       Perception     Praxis      Cognition Arousal: Alert Behavior During Therapy: [facility] for tasks assessed/performed Overall Cognitive Status: Within Functional Limits for tasks assessed  Exercises      Shoulder Instructions       General Comments      Pertinent Vitals/ Pain       Pain Assessment Pain Assessment: 0-10 Pain Score: 7  Faces Pain Scale: Hurts even more Pain Location: R knee Pain Descriptors / Indicators: Discomfort, Aching, Moaning Pain Intervention(s): Monitored during session  Home Living                                          Prior Functioning/Environment              Frequency  Min 1X/week        Progress Toward Goals  OT Goals(current goals can  now be found in the care plan section)     Acute Rehab OT Goals Patient Stated Goal: to manage pain OT Goal Formulation: With patient Time For Goal Achievement: 01/12/23 Potential to Achieve Goals: Good  Plan      Co-evaluation                 AM-PAC OT "6 Clicks" Daily Activity     Outcome Measure   Help from another person eating meals?: None Help from another person taking care of personal grooming?: A Little Help from another person toileting, which includes using toliet, bedpan, or urinal?: A Little Help from another person bathing (including washing, rinsing, drying)?: A Lot Help from another person to put on and taking off regular upper body clothing?: A Little Help from another person to put on and taking off regular lower body clothing?: A Lot 6 Click Score: 17    End of Session Equipment Utilized During Treatment: Gait belt;Rolling walker (2 wheels)  OT Visit Diagnosis: Pain;Unsteadiness on feet (R26.81);Other abnormalities of gait and mobility (R26.89);History of falling (Z91.81) Pain - Right/Left: Right Pain - part of body: Knee   Activity Tolerance Patient tolerated treatment well   Patient Left in bed;with call bell/phone within reach   Nurse Communication Mobility status        Time: 2956-2130 OT Time Calculation (min): 29 min  Charges: OT General Charges $OT Visit: 1 Visit OT Treatments $Self Care/Home Management : 23-37 mins  Perkins, OTR/L   Alexis Goodell 01/06/2023, 4:06 PM

## 2023-01-06 NOTE — Discharge Instructions (Signed)
[employer] assistance programs Crisis assistance programs  -Part[facility] Ending Homelessness Arts development officer. If you are experiencing homelessness in [place], [place], your first point of contact should be Pensions consultant. You can reach Coordinated Entry by calling (336) [phone] or by emailing [email or web address] [email or web address].  Community access points: [employer] [phone] [address], [place]) every Tuesday from 9am-10am. [facility] ([address] [place] [place]) every Wednesday from 8am-9am.   -The [employer] [phone]) offers several services to local families, as funding allows. The Emergency Assistance Program (EAP), which they administer, provides household goods, free food, clothing, and financial aid to people in need in the [facility] area. The EAP program does have some qualification, and counselors will interview clients for financial assistance by written referral only. Referrals need to be made by the Department of Social Services or by other EAP approved human services agencies or charities in the area.  -Open Door Ministries of [employer], which can be reached at ([phone], offers emergency assistance programs for those in need of help, such as food, rent assistance, a soup kitchen, shelter, and clothing. They are based in [facility] but provide a number of services to those that qualify for assistance.   [facility] Department of Social Services may be able to offer temporary financial assistance and cash grants for paying rent and utilities, Help may be provided for local county residents who may be experiencing personal crisis when other resources, including government programs, are not available. Call ([phone]  -High [employer] Army is a [employer] agency, The organization can offer emergency assistance for paying rent, Caremark Rx, utilities, food,  household products and furniture. They offer extensive emergency and transitional housing for families, children and single women, and also run a Boy's and [employer]. Thrift Shops, Secondary school teacher, and other aid offered too. [address] [place], [place] [address], [phone]  -[place] Low Income Energy Assistance Program -- This is offered for [facility] families. The federal government created [employer] Program provides a one-time cash grant payment to help eligible low-income families pay their electric and heating bills. [address], [employer], [place] [address], ([phone])[phone]  -[place] Emergency Assistance -- A program offers emergency utility and rent funds for greater [employer] area residents. The program can also provide counseling and referrals to charities and government programs. Also provides food and a free meal program that serves lunch Mondays - Saturdays and dinner seven days per week to individuals in the community. [address], [place], [place] [address], [phone]  -[employer] - Offers affordable apartment and housing communities across      [place] and [place]. The low income and seniors can access public housing, rental assistance to qualified applicants, and apply for the section 8 rent subsidy program. Other programs include Chiropractor and Engineer, maintenance. [address] [place], [place] [address], dial [phone].  -The [facility] provides transitional housing to veterans and the disabled. Clients will also access other services too, including assistance in applying for Disability, life skills classes, case management, and assistance in finding permanent housing. [address], [place], [place] [address], call [phone]  -[place] Transitional Housing through [employer] is for  people who were just evicted or that are formerly homeless. The non-profit will also help then gain self-sufficiency, find a home or apartment to  live in, and also provides information on rent assistance when needed. Phone [phone]  -The [place] [facility] [facility] helps low income, elderly, or disabled residents in seven counties in the [place] ([place], [place], [place], [place], [place], [place], [place], and [place]) save energy and reduce their utility bills by improving energy efficiency. Phone [phone].  -[employer] is located in the [place] [facility] in the [employer], [address], [place], [place] [address]. Parking is in the rear of the building. Phone: [phone]   General Email: info@gsohc .org  GHC provides free housing counseling assistance in locating affordable rental housing or housing with support services for families and individuals in crisis and the chronically homeless. We provide potential resources for other housing needs like utilities. Our trained counselors also work with clients on budgeting and financial literacy in effort to empower them to take control of their financial situations. [employer] collaborates with homeless service providers and other stakeholders as part of the [employer] COC (Continuum of Care). The (COC) is a regional/local planning body that coordinates housing and services funding for homeless families and individuals. The role of [facility] in the COC is through housing counseling to work with people we serve on diversion strategies for those that are at imminent risk of becoming homeless. We also work with the Coordinated Assessment/Entry Specialist who attempts to find temporary solutions and/or connects the people to Housing First, Rapid Re-housing or transitional housing programs. Our Homelessness Prevention Housing Counselors meet  with clients on business days (Monday-Fridays, except scheduled holidays) from 8:30 am to 4:30 pm.  Legal assistance for evictions, foreclosure, and more -If you need free legal advice on civil issues, such as foreclosures, evictions, Electronics engineer, government programs, domestic issues and more, Armed forces operational officer Aid of [facility]) is a Associate Professor firm that provides free legal services and counsel to lower income people, seniors, disabled, and others, The goal is to ensure everyone has access to justice and fair representation. Call them at (830) [phone].  [facility] for Housing and Community Studies can provide info about obtaining legal assistance with evictions. Phone [phone].  Data processing manager  The [employer], [employer]. offers job and Dispensing optician. Resources are focused on helping students obtain the skills and experiences that are necessary to compete in today's challenging and tight job market. The non-profit faith-based community action agency offers internship trainings as well as classroom instruction. Classes are tailored to meet the needs of people in the [facility] region. [place], [place] [address], [phone]  Foreclosure prevention/Debt Services Family Services of the [employer] Credit Counseling Service inludes debt and foreclosure prevention programs for local families. This includes money management, financial advice, budget review and development of a written action plan with a Pensions consultant to help solve specific individual financial problems. In addition, housing and mortgage counselors can also provide pre- and post-purchase homeownership counseling, default resolution counseling (to prevent foreclosure) and reverse mortgage counseling. A Debt Management Program allows people and families with a high level of credit card or medical debt to consolidate and repay consumer debt and loans to  creditors and rebuild positive credit ratings and scores. Contact (336) Q4373065.  Community clinics in [place] -Health Department [facility]: [address], [place], [address]. [phone].  -Health Department [place] [facility]: [address], [place], [address]. [phone].  -[facility] offers medical care through a group of  doctors, pharmacies and other healthcare related agencies that offer services for low income, uninsured adults in [place]. Also offers adult Dental care and assistance with applying for an [employer]. Call [phone].   [facility]. This center provides low-cost health care to those without health insurance. Services offered include an onsite pharmacy. Phone [phone]. [address], [address], [place].  -Medication Assistance Program serves as a link between pharmaceutical companies and patients to provide low cost or free prescription medications. This service is available for residents who meet certain income restrictions and have no insurance coverage. PLEASE CALL [phone] Ginette Otto) OR [phone] ([place])  -One Step Further: Materials engineer, The [employer] Support & Nutrition Program, [employer]. Call [phone]/ [phone].  Food pantry and assistance -Urban Ministry-Food Bank: [address], [place] [address]. Phone [phone]  -Blessed Table Food Pantry: [address] [place], [place] [address]. [phone].  -Missionary Ministry: has the purpose of visiting the sick and shut-ins and provide for needs in the surrounding communities. Call [phone]. Email: [email or web address] .[email or web address] This program provides: Food box for seniors, Financial assistance, Food to meet basic nutritional needs.  -Meals on Wheels with Senior Resources: [facility] residents age 95 and over who are homebound and  unable to obtain and prepare a nutritious meal for themselves are eligible for this service. There may be a waiting list in certain parts of [facility] if the route in that area is full. If you are in [facility] and [place] call [phone] to register. For all other areas call [phone] to register.  -Greater Dietitian: https://findfood.BargainContractor.si  TRANSPORTATION: -[employer] Department of Health: Call [facility] and [employer] at [phone] for details. AttractionGuides.es  -Access [place]: Access [place] is the [employer] Agency's shared-ride transportation service for eligible riders who have a disability that prevents them from riding the fixed route bus. Call [phone]. Access [place] riders must pay a fare of $1.50 per trip, or may purchase a 10-ride punch card for $14.00 ($1.40 per ride) or a 40-ride punch card for $48.00 ($1.20 per ride).  -The Shepherd's WHEELS rideshare transportation service is provided for senior citizens (60+) who live independently within [place] city limits and are unable to drive or have limited access to transportation. Call [phone] to schedule an appointment.  -[place] Transportation: For Medicare or Medicaid recipients call 6670326636?[facility], wheelchair Zenaida Niece, and ambulatory quotes available.   FLEEING VIOLENCE: -Family Services of the [place]- 24/7 Crisis line [phone]) -[facility] Justice Centers: (336) 641-SAFE [phone])  [place] 2-1-1 is another useful way to locate resources in the community. Visit ShedSizes.ch to find service information online. If you need additional assistance, 2-1-1 Referral Specialists are available 24 hours a day, every day by dialing 2-1-1 or [phone] from any phone. The call is free, confidential, and available in any language.  Affordable Housing  Search http://www.nchousingsearch.org  [facility])   M-F 8a-3p [address] [place], [place] [address] [phone] Services include: laundry, barbering, support groups, case management, phone & computer access, showers, AA/NA mtgs, mental health/substance abuse nurse, job skills class, disability information, VA assistance, spiritual classes, etc. Winter Shelter available when temperatures are less than 32 degrees.   HOMELESS SHELTERS [facility] at [facility]- Call [phone] ext. [phone] or ext. [phone]. Located at [address] [place], [place] [address]  [facility] Mens Shelter- Call (  [phone]) P8635165. Located at [address], [place] [address].  [facility]- [employer]. Call [phone]. Office located at [address] [place] [address].  Pathways Family Housing through [employer] 6064848011.  [facility]- Call [phone]. Located at [address] [place], [place], [place] [address].  Room at the Inn-For Pregnant mothers. Call [phone]. Located at [address] [place], [address].  [facility]-For men in [place]. Call [phone].  Home of [employer] for [employer] [phone]. Office located at [address], [place], [address].  [employer] be agreeable to help with chores. Call [phone] ext. [phone].  Men's: [address] [place], [place] [address]. Women's: [facility]  [address] [place], [place] [address]  Crisis Services [facility] Mobile Crisis Management- [phone]  [facility] [address] [place], [place] [address]. Phone: [phone]  In a time of Crisis: [facility]rapeutic Alternatives, inc.  Mobile Crisis Management provides immediate crisis response, 24/7.  Call [phone]  [facility] for MH/DD/SA [facility] is  available 24 hours a day, 7 days a week. Customer Service Specialists will assist you to find a crisis provider that is well-matched with your needs. Your local number is: [phone]  [facility]/Behavioral Health Urgent Care (BHUC) IOP, individual counseling, medication management [address] [place], [place] [address] [phone] Call for intake hours; Medicaid and Uninsured    Outpatient Providers  [facility]cohol [facility] Drug Services (ADS) Group and individual counseling. [address]  [place], [place] [address] [phone] [place]: [phone]  [place]: [phone] Medicaid and uninsured.   [facility] Offers IOP groups multiple times per week. [address] [place], [place] [address] [phone] Takes Medicaid and other insurances.   [facility] Out[facility]  Chemical Dependency Intensive Outpatient Program (IOP) [address] [place], [place] [address] [phone] Takes Nurse, learning disability and [place].   [facility]  IOP and Partial Hospitalization Program  [address]  [place], [place] [address] [phone] Private Insurance, [place] only for partial hospitalization  [facility]ing of [place] [address], [place], [place] [address] [phone] Monday-Friday. Short and Long term options. [facility]/Behavioral Health Urgent Care (BHUC) IOP, individual counseling, medication management [address] [place], [place] [address] [phone] Medicaid and [facility]  [facility] [address]  [place], [place] [address] [phone] Private Insurance and Self Pay   [facility] [address]  [place], [place] [address] [phone] Private Insurance, [place], and Self Pay   [facility]: Methadone Clinic  [address] [place], [place] [address] [facility]  [address]  [place], [place] [address] [phone]  Caring Services  [address] [place], [place] [address] [phone]  [facility] [phone] Marcy Panning and [facility]      Residential Treatment Programs  [facility] ([facility] [address] [place], [place] [address] [phone] or [phone] Detox and Residential Rehab 14 days (Medicare, Medicaid, private insurance, and self pay). No methadone. Call for pre-screen.   [facility]) [address]  [place], [place] [address] [phone] Detox (self Pay and Medicaid Limited availability) Rehab Only Male (Medicare, Medicaid, and Self Pay)-No methadone.  [facility] [address] [place], [place]  [address] [phone] or [phone] Private Insurance only  [facility] [place] [address] [place], [place] [address] Phone:  [phone] Must be detoxed 72 hours prior to admission; 28 day program.  Self-pay.  [facility] [address]  [place], [place] [phone] [employer], Medicare, [place] (not straight [place]). They offer assistance with transportation.   [facility] [address] [place],  [place], [place] [address] [phone] Christian Based Program. Men only. No insurance  [facility] [address] [place], [place] [address] Women's: [phone] Men's: [phone] No Medicaid.   Addiction Centers of [place] Locations across the [place] (mainly [place]) willing to help with transportation.  [phone] [employer]. [facility]  [address]  [place], [place] [address] [phone] Treatment Only, must make assessment appointment, and must be sober for assessment appointment. Self pay, Medicare A and B, [facility], must be [facility] resident. No methadone.   [address] [place], [place] [address] Phone: [phone] Inpatient 24/7 and outpatient services. Individuals with Medicaid have no obligation for a copay. Individuals with Medicare or private insurance will be obligated to meet their policy's requirement(s). Individuals who are uninsured will be eligible for a sliding or discounted scaled  [facility]  [address] [place], [place] [address] [phone] No pending legal charges, Long-term work program. No methadone. Call for assessment.  [facility]  [address] [place], [place] [address] [phone] or [phone] Commercial Insurance Only  [facility] - [phone] Private Insurance (no [place]). Males/Females, call to make referrals, multiple facilities   [facility] [address]  [place], [place] [address]  [phone] Men Only Upfront Fee   [facility]-no methadone [facility] Campus [address] [place], [place] [address] [phone] (f)         AA Meetings Website to locate meetings (virtually or in person): [email or web address].[email or web address]/ Phone: [phone]  Syringe Services Program: Due to COVID-19, syringe services programs are likely operating under different hours with limited or no fixed site hours. Some programs may not be operating at all. Please contact the program directly using the phone numbers provided below to see if they are still operating under COVID-19.  [facility] Solution to the Opioid Problem (GCSTOP) Fixed; mobile; peer-based Roxy Cedar [phone] [email or web address] Fixed site exchange at [facility], [address] [place], [place] [address] on Wednesdays (2:00 - 5:00 pm) and Thursdays (4:00 - 8:00 pm). Pop-up mobile exchange locations: [employer] and [employer], [address] [place], [place] [address] on Tuesdays (11:00 am - 1:00 pm) and Fridays (11:00 am - 1:00  pm)  -[facility] - [address], [place], [place] [address] on Tuesdays (2:00 - 4:00 pm) and Fridays (2:00 - 4:00 pm)  -[place] Survivors Publishing copy - also serves Radio broadcast assistant and [employer] [place] [employer]; Fixed; mobile; peer-based; [place] Ka [phone] [email or web address] [address], [place], [place] [address] Delivery and outreach available in [place] and [place], please call for more information. Monday, Tuesday: 1:00 -7:00 pm, Thursday: 4:00 pm - 8:00 pm or Friday: 1:00 pm - 8:00 pm  Medication-Assisted Treatment (MAT):  -[place]- services [address] and surrounding areas including [place], [place], [place], [place], [address], [place], [place], [place], [place], and [place], [place]. Options include Methadone, buprenorphine or Suboxone. [address], [place], [place] [address]  Phone: [phone] Mon - Fri: 5:30am - 2:00pm Sat: 5:30am -7:30am Sun: Closed Holidays: 6:00am - 8:00am  -[facility] [place]- We use FDA-approved medications, like methadone/suboxone/sublocade, and vivitrol. These medications are then combined with customized care plans that include individual or group counseling, toxicology, and medical care directed by on-site physicians. Accepts most insurance plans, Medicaid, and private pay.  [address] [place], [place] [address] Phone: [phone] Monday-Friday 5:00 AM - 10:00 AM Saturday 6:00 AM - 8:30 AM Sunday 6:00 AM - 7:00 AM  -Alcohol & Drug Services- ADS is a treatment & recovery focused program. In addition to receiving methadone medication, our clients participate in individual and group counseling as well as random drug testing. If accepted into the [facility]S Opioid Program, you will be provided several intake appointments and a physical exam [address] [place], [place] [address] Office: [phone]  Fax: [phone]  -[facility]- We put our  community members at the center of everything we do, for remote treatment services as well as in-person, from alcohol withdrawal to opioid use and more.  [address], [place], [place] [address] [phone] Monday-Wednesday: 9:00am - 5:00pm Thursday: 9:00am - 6:00pm Friday: 9:00am - 5:00pm Saturday: 9:00am - 1:00pm Sunday: Closed  -[facility] [place]) [address] [place], [place] [address] [phone]  [place] [phone] [address] [place], [place] [address]  M-W    5:00am-12:00pm Thu     5:00am-10:00am Fri       5:00am-12:00pm Sat      5:00am-8:00am Sun     Closed  $12/daily for Methadone Treatment.

## 2023-01-07 DIAGNOSIS — I480 Paroxysmal atrial fibrillation: Secondary | ICD-10-CM | POA: Diagnosis not present

## 2023-01-07 DIAGNOSIS — I4891 Unspecified atrial fibrillation: Secondary | ICD-10-CM | POA: Diagnosis not present

## 2023-01-07 DIAGNOSIS — I10 Essential (primary) hypertension: Secondary | ICD-10-CM | POA: Diagnosis not present

## 2023-01-07 DIAGNOSIS — M009 Pyogenic arthritis, unspecified: Secondary | ICD-10-CM | POA: Diagnosis not present

## 2023-01-07 LAB — GLUCOSE, CAPILLARY: Glucose-Capillary: 89 mg/dL (ref 70–99)

## 2023-01-07 LAB — AEROBIC/ANAEROBIC CULTURE W GRAM STAIN (SURGICAL/DEEP WOUND)

## 2023-01-07 NOTE — Progress Notes (Signed)
Physical Therapy Treatment Patient Details Name: Jonathan Ibarra MRN: [id number] DOB: 1966-01-29 Today's Date: 01/07/2023   History of Present Illness Pt is a 57 y/o male presenting 10/28 to [facility] with severe right knee pain and drainage after a fall the night PTA, he is now s/p R knee I&D on 12/29/22. He recently had a R tibial ORIF on 12/13/22 after a falls incidence. VQQ:VZDGLOV abuse, [name], HTN, liver cirrhosis, polysubstance abuse.    PT Comments  Pt received in supine and agreeable to session. Pt premedicated prior to session, however continuing to report significant R knee pain. Pt able to tolerate supine exercises for strengthening and knee ROM. Pt able to increase gait distance this session with pt determined to get to the nurses station to get a newspaper despite fatigue. Pt demonstrating decreased safety awareness with therapist cuing pt to turn around due to increased fatigue, however pt continuing. Pt reaches out to rail and sink at end of gait trial for increased support during frequent standing rest breaks. Pt educated on importance of maintaining RLE NWB and pt is able to demonstrate during ambulation until stepping back to EOB at end of session. Pt continues to benefit from PT services to progress toward functional mobility goals.    If plan is discharge home, [name];[name];Assistance with cooking/housework;Assist for transportation;Help with stairs or ramp for entrance   Can travel by private vehicle        Equipment Recommendations  None recommended by PT    Recommendations for Other Services       Precautions / Restrictions Precautions Precautions: Fall Restrictions Weight Bearing Restrictions: Yes RLE Weight Bearing: Non weight bearing     Mobility  Bed Mobility Overal bed mobility: Needs Assistance Bed Mobility: Supine to Sit, Sit to Supine     Supine to sit:  Contact guard, [name] Sit to supine: Contact guard assist   General bed mobility comments: increased time    Transfers Overall transfer level: Needs assistance Equipment used: Rolling walker (2 wheels) Transfers: Sit to/from Stand Sit to Stand: Contact guard assist, From elevated surface           General transfer comment: Pt requesting elevated EOB. Cues for hand placement and technique due to pt attempting to lean over RLE to stand    Ambulation/Gait Ambulation/Gait assistance: Contact guard assist Gait Distance (Feet): 110 Feet Assistive device: Rolling walker (2 wheels) Gait Pattern/deviations: Trunk flexed (hop-to) Gait velocity: decreased     General Gait Details: Frequent standing rest breaks due to fatigue, however pt insisting on increasing distance. Pt able to maintain RLE NWB during ambulation, however sets foot on floor during standing rest breaks. Pt noted to TDWB while stepping back to EOB despite cues. Pt reaching out to rail and sink for increased support at end of trial.      Balance Overall balance assessment: Needs assistance Sitting-balance support: No upper extremity supported, Feet supported Sitting balance-Leahy Scale: Good Sitting balance - Comments: sitting EOB   Standing balance support: Bilateral upper extremity supported, During functional activity, Reliant on assistive device for balance Standing balance-Leahy Scale: Poor Standing balance comment: reliant on RW support                            Cognition Arousal: Alert Behavior During Therapy: WFL for tasks assessed/performed Overall Cognitive Status: Within Functional Limits for tasks assessed  General Comments: Requires cues for safety        Exercises General Exercises - Lower Extremity Ankle Circles/Pumps: AROM, Supine, Both, 5 reps Quad Sets: AROM, Supine, Right, 5 reps Heel Slides: AROM, Supine, Right, 10 reps     General Comments General comments (skin integrity, edema, etc.): HR up to 130's with ambulation      Pertinent Vitals/Pain Pain Assessment Pain Assessment: 0-10 Pain Score: 9  Pain Location: R knee Pain Descriptors / Indicators: Discomfort, Aching, Moaning Pain Intervention(s): Monitored during session, Limited activity within patient's tolerance, Repositioned     PT Goals (current goals can now be found in the care plan section) Acute Rehab PT Goals Patient Stated Goal: decrease pain and go home PT Goal Formulation: With patient Time For Goal Achievement: 01/12/23 Progress towards PT goals: Progressing toward goals    Frequency    Min 1X/week       AM-PAC PT "6 Clicks" Mobility   Outcome Measure  Help needed turning from your back to your side while in a flat bed without using bedrails?: None Help needed moving from lying on your back to sitting on the side of a flat bed without using bedrails?: A Little Help needed moving to and from a bed to a chair (including a wheelchair)?: A Little Help needed standing up from a chair using your arms (e.g., wheelchair or bedside chair)?: A Little Help needed to walk in hospital room?: A Little Help needed climbing 3-5 steps with a railing? : Total 6 Click Score: 17    End of Session Equipment Utilized During Treatment: Gait belt Activity Tolerance: Patient tolerated treatment well;Patient limited by pain Patient left: in bed;with call bell/phone within reach;with bed alarm set Nurse Communication: Mobility status;Patient requests pain meds PT Visit Diagnosis: Muscle weakness (generalized) (M62.81);Other abnormalities of gait and mobility (R26.89)     Time: 4098-1191 PT Time Calculation (min) (ACUTE ONLY): 30 min  Charges:    $Gait Training: 8-22 mins $Therapeutic Exercise: 8-22 mins PT General Charges $$ ACUTE PT VISIT: 1 Visit                    [name], PTA Acute Rehabilitation Services Secure Chat Preferred   [phone]    [name] 01/07/2023, 11:29 AM

## 2023-01-07 NOTE — Plan of Care (Signed)
  Problem: Education: Goal: Knowledge of disease or condition will improve Outcome: Progressing   Problem: Health Behavior/Discharge Planning: Goal: Ability to safely manage health-related needs after discharge will improve Outcome: Progressing   Problem: Health Behavior/Discharge Planning: Goal: Ability to manage health-related needs will improve Outcome: Progressing   Problem: Nutrition: Goal: Adequate nutrition will be maintained Outcome: Progressing   Problem: Elimination: Goal: Will not experience complications related to urinary retention Outcome: Progressing

## 2023-01-07 NOTE — Plan of Care (Signed)
  Problem: Education: Goal: Knowledge of disease or condition will improve Outcome: Progressing Goal: Individualized Educational Video(s) Outcome: Progressing   Problem: Activity: Goal: Ability to tolerate increased activity will improve Outcome: Progressing   Problem: Health Behavior/Discharge Planning: Goal: Ability to safely manage health-related needs after discharge will improve Outcome: Progressing   Problem: Education: Goal: Knowledge of General Education information will improve Description: Including pain rating scale, medication(s)/side effects and non-pharmacologic comfort measures Outcome: Progressing   Problem: Clinical Measurements: Goal: Ability to maintain clinical measurements within normal limits will improve Outcome: Progressing Goal: Will remain free from infection Outcome: Progressing Goal: Diagnostic test results will improve Outcome: Progressing Goal: Cardiovascular complication will be avoided Outcome: Progressing   Problem: Coping: Goal: Level of anxiety will decrease Outcome: Progressing   Problem: Safety: Goal: Ability to remain free from injury will improve Outcome: Progressing

## 2023-01-07 NOTE — Plan of Care (Signed)
  Problem: Education: Goal: Knowledge of disease or condition will improve Outcome: Progressing Goal: Understanding of medication regimen will improve Outcome: Progressing   Problem: Activity: Goal: Ability to tolerate increased activity will improve Outcome: Progressing   Problem: Cardiac: Goal: Ability to achieve and maintain adequate cardiopulmonary perfusion will improve Outcome: Progressing   Problem: Health Behavior/Discharge Planning: Goal: Ability to safely manage health-related needs after discharge will improve Outcome: Progressing   Problem: Clinical Measurements: Goal: Ability to maintain clinical measurements within normal limits will improve Outcome: Progressing Goal: Will remain free from infection Outcome: Progressing

## 2023-01-07 NOTE — Progress Notes (Signed)
PROGRESS NOTE        PATIENT DETAILS Name: Jonathan Ibarra Age: 57 y.o. Sex: male Date of Birth: 1966/02/19 Admit Date: 12/27/2022 Admitting Physician Jonathan Shipper, MD ION:GEXBM, Jonathan Browns, MD  Brief Summary: Patient is a 57 y.o.  male with history of liver cirrhosis, HTN, polysubstance abuse-recent hospitalization from 10/13-10/18 following trauma with fall resulting in a right tibia plateau fracture requiring ORIF on 10/14-presented with purulent discharge from his right knee-he was thought to have septic arthritis and subsequently admitted to the hospitalist service.  Significant events: 10/13-10/18 >> hospitalization-right tibial plateau fracture following a fall-s/p ORIF.   10/28>> purulent discharge from right knee-septic arthritis-admit to [facility]  Significant studies: 08/02>> echo: EF 55-60% 10/28>> x-ray right knee: Unchanged lateral plate/screw fixation across comminuted fracture involving medial/lateral tibial plateaus, moderate knee joint effusion 10/29>> CXR: No active disease  Significant microbiology data: 10/29>> blood culture: 1/2-Corynebacterium (contamination) 10/30>> right knee abscess culture: Klebsiella pneumoniae-ESBL 10/31>> blood culture: No growth  Procedures: 10/30>> irrigation and debridement of right knee  Consults: Ortho recs Infectious disease  Subjective: No complaints-lying comfortably in bed-had BM yesterday and today.  Objective: Vitals: Blood pressure 98/70, pulse 88, temperature 98 F (36.7 C), resp. rate 11, height 6' (1.829 m), weight 123.6 kg, SpO2 96%.   Exam: Awake/alert Nonfocal exam Abdomen soft nontender nondistended  Pertinent Labs/Radiology:    Latest Ref Rng & Units 01/06/2023    4:51 AM 01/04/2023    4:30 AM 12/31/2022    3:20 AM  CBC  WBC 4.0 - 10.5 K/uL 5.0  4.1  6.3   Hemoglobin 13.0 - 17.0 g/dL 84.1  32.4  40.1   Hematocrit 39.0 - 52.0 % 31.3  32.3  32.1   Platelets 150 - 400 K/uL 236   226  313     Lab Results  Component Value Date   NA 134 (L) 01/06/2023   K 4.2 01/06/2023   CL 102 01/06/2023   CO2 27 01/06/2023      Assessment/Plan: Septic arthritis right knee (ESBL Klebsiella pneumoniae)-with recent history of ORIF on 10/14 with hardware in place S/p I&D 10/30-VAC discontinued 11/4 ID recommending Invanz-EOT 12/10-will need to call ID 1 or 2 days prior to EOT for final recommendations prior to discharge (may require suppressive antibiotic) Not a candidate for home IV therapies given history of drug use Ortho following-WBAT as tolerated  PAF with RVR Rate controlled with oral metoprolol Not a candidate for anticoagulation-in any events CHADS2-VASC score of 1  Liver cirrhosis-s/p TIPS Relatively well compensated Now having BM with changes lactulose-continue 30 g 3 times a day.  Diuretics on hold due to soft BP (on midodrine for BP support)  Polysubstance abuse Acknowledges ongoing meth/THC use and remote cocaine use-although UDS positive for cocaine Ongoing counseling  Obesity: Estimated body mass index is 36.96 kg/m as calculated from the following:   Height as of this encounter: 6' (1.829 m).   Weight as of this encounter: 123.6 kg.   Code status:   Code Status: Full Code   DVT Prophylaxis: enoxaparin (LOVENOX) injection 40 mg Start: 12/30/22 0900 SCDs Start: 12/28/22 0816   Family Communication: None at bedside   Disposition Plan: Status is: Inpatient Remains inpatient appropriate because: Severity of illness   Planned Discharge Destination:Home   Diet: Diet Order  Diet regular Room service appropriate? Yes; Fluid consistency: Thin  Diet effective now                     Antimicrobial agents: Anti-infectives (From admission, onward)    Start     Dose/Rate Route Frequency Ordered Stop   12/31/22 1545  ertapenem (INVANZ) 1 g in sodium chloride 0.9 % 100 mL IVPB        1 g 200 mL/hr over 30 Minutes Intravenous  Every 24 hours 12/31/22 1546     12/31/22 1100  ertapenem (INVANZ) 1 g in sodium chloride 0.9 % 100 mL IVPB  Status:  Discontinued        1 g 200 mL/hr over 30 Minutes Intravenous Every 24 hours 12/31/22 0931 12/31/22 1546   12/30/22 1400  cefTRIAXone (ROCEPHIN) 2 g in sodium chloride 0.9 % 100 mL IVPB  Status:  Discontinued        2 g 200 mL/hr over 30 Minutes Intravenous Every 24 hours 12/30/22 1056 12/31/22 0931   12/29/22 1000  tobramycin (NEBCIN) powder  Status:  Discontinued          As needed 12/29/22 1022 12/29/22 1035   12/29/22 1000  vancomycin (VANCOCIN) powder  Status:  Discontinued          As needed 12/29/22 1023 12/29/22 1035   12/28/22 1400  piperacillin-tazobactam (ZOSYN) IVPB 3.375 g  Status:  Discontinued        3.375 g 12.5 mL/hr over 240 Minutes Intravenous Every 8 hours 12/28/22 1202 12/30/22 1056   12/28/22 1300  vancomycin (VANCOCIN) IVPB 1000 mg/200 mL premix  Status:  Discontinued        1,000 mg 200 mL/hr over 60 Minutes Intravenous Every 8 hours 12/28/22 1202 12/31/22 1029   12/28/22 0900  doxycycline (VIBRAMYCIN) 100 mg in dextrose 5 % 250 mL IVPB  Status:  Discontinued        100 mg 125 mL/hr over 120 Minutes Intravenous Every 12 hours 12/28/22 0826 12/28/22 1141   12/28/22 0115  vancomycin (VANCOCIN) IVPB 1000 mg/200 mL premix  Status:  Discontinued        1,000 mg 200 mL/hr over 60 Minutes Intravenous  Once 12/28/22 0107 12/28/22 0108   12/28/22 0115  piperacillin-tazobactam (ZOSYN) IVPB 3.375 g        3.375 g 100 mL/hr over 30 Minutes Intravenous  Once 12/28/22 0107 12/28/22 0142   12/28/22 0115  vancomycin (VANCOREADY) IVPB 2000 mg/400 mL        2,000 mg 200 mL/hr over 120 Minutes Intravenous  Once 12/28/22 0108 12/28/22 0342   12/28/22 0100  doxycycline (VIBRA-TABS) tablet 100 mg        100 mg Oral  Once 12/28/22 0049 12/28/22 0054        MEDICATIONS: Scheduled Meds:  cholecalciferol  1,000 Units Oral Daily   enoxaparin (LOVENOX) injection  40  mg Subcutaneous Q24H   folic acid  1 mg Oral Daily   lactulose  30 g Oral TID   metoprolol tartrate  100 mg Oral BID   midodrine  5 mg Oral TID WC   sodium chloride flush  3 mL Intravenous Q12H   thiamine  100 mg Oral Daily   Vitamin D (Ergocalciferol)  50,000 Units Oral Q7 days   Continuous Infusions:  ertapenem 1 g (01/07/23 1005)   PRN Meds:.acetaminophen, bisacodyl, diltiazem, HYDROmorphone (DILAUDID) injection, lactulose, methocarbamol, oxyCODONE **OR** oxyCODONE, polyethylene glycol, sodium chloride flush   I have personally  reviewed following labs and imaging studies  LABORATORY DATA: CBC: Recent Labs  Lab 01/04/23 0430 01/06/23 0451  WBC 4.1 5.0  HGB 11.0* 10.5*  HCT 32.3* 31.3*  MCV 89.7 89.2  PLT 226 236    Basic Metabolic Panel: Recent Labs  Lab 01/01/23 0449 01/04/23 0430 01/06/23 0451  NA 134* 132* 134*  K 3.9 3.9 4.2  CL 101 99 102  CO2 28 28 27   GLUCOSE 125* 91 94  BUN 17 12 13   CREATININE 0.86 0.81 0.69  CALCIUM 8.5* 8.3* 8.5*  MG 1.8 1.6*  --     GFR: Estimated Creatinine Clearance: 140 mL/min (by C-G formula based on SCr of 0.69 mg/dL).  Liver Function Tests: Recent Labs  Lab 01/06/23 0451  AST 24  ALT 10  ALKPHOS 106  BILITOT 1.0  PROT 6.1*  ALBUMIN 1.9*   No results for input(s): "LIPASE", "AMYLASE" in the last 168 hours. No results for input(s): "AMMONIA" in the last 168 hours.  Coagulation Profile: No results for input(s): "INR", "PROTIME" in the last 168 hours.  Cardiac Enzymes: No results for input(s): "CKTOTAL", "CKMB", "CKMBINDEX", "TROPONINI" in the last 168 hours.  BNP (last 3 results) No results for input(s): "PROBNP" in the last 8760 hours.  Lipid Profile: No results for input(s): "CHOL", "HDL", "LDLCALC", "TRIG", "CHOLHDL", "LDLDIRECT" in the last 72 hours.  Thyroid Function Tests: No results for input(s): "TSH", "T4TOTAL", "FREET4", "T3FREE", "THYROIDAB" in the last 72 hours.  Anemia Panel: No results for  input(s): "VITAMINB12", "FOLATE", "FERRITIN", "TIBC", "IRON", "RETICCTPCT" in the last 72 hours.  Urine analysis:    Component Value Date/Time   COLORURINE AMBER (A) 10/01/2022 0142   APPEARANCEUR CLOUDY (A) 10/01/2022 0142   LABSPEC 1.025 10/01/2022 0142   PHURINE 5.0 10/01/2022 0142   GLUCOSEU NEGATIVE 10/01/2022 0142   HGBUR SMALL (A) 10/01/2022 0142   BILIRUBINUR NEGATIVE 10/01/2022 0142   KETONESUR NEGATIVE 10/01/2022 0142   PROTEINUR 30 (A) 10/01/2022 0142   NITRITE NEGATIVE 10/01/2022 0142   LEUKOCYTESUR LARGE (A) 10/01/2022 0142    Sepsis Labs: Lactic Acid, Venous    Component Value Date/Time   LATICACIDVEN 1.5 12/12/2022 2311    MICROBIOLOGY: Recent Results (from the past 240 hour(s))  Aerobic/Anaerobic Culture w Gram Stain (surgical/deep wound)     Status: None   Collection Time: 12/29/22  9:51 AM   Specimen: Joint, Other; Body Fluid  Result Value Ref Range Status   Specimen Description KNEE  Final   Special Requests RT KNEE ABSCESS  Final   Gram Stain   Final    ABUNDANT WBC PRESENT, PREDOMINANTLY PMN NO ORGANISMS SEEN    Culture   Final    RARE KLEBSIELLA PNEUMONIAE SUSCEPTIBILITIES PERFORMED ON PREVIOUS CULTURE WITHIN THE LAST 5 DAYS. NO ANAEROBES ISOLATED Performed at [facility], [address], [place], [place] [address]    Report Status 01/03/2023 FINAL  Final  Aerobic/Anaerobic Culture w Gram Stain (surgical/deep wound)     Status: None (Preliminary result)   Collection Time: 12/29/22  9:58 AM   Specimen: Joint, Other; Body Fluid  Result Value Ref Range Status   Specimen Description KNEE  Final   Special Requests B RT KNEE ABSCESS  Final   Gram Stain   Final    ABUNDANT WBC PRESENT, PREDOMINANTLY PMN NO ORGANISMS SEEN    Culture   Final    FEW KLEBSIELLA PNEUMONIAE Confirmed Extended Spectrum Beta-Lactamase Producer (ESBL).  In bloodstream infections from ESBL organisms, carbapenems are preferred over  piperacillin/tazobactam. They are  shown to have a lower risk of mortality. NO ANAEROBES ISOLATED Sent to [facility] for further susceptibility testing. Performed at [facility], [address] [place], [place] [address]    Report Status PENDING  Incomplete   Organism ID, Bacteria KLEBSIELLA PNEUMONIAE  Final      Susceptibility   Klebsiella pneumoniae - MIC*    AMPICILLIN >=32 RESISTANT Resistant     CEFEPIME 2 SENSITIVE Sensitive     CEFTAZIDIME RESISTANT Resistant     CEFTRIAXONE >=64 RESISTANT Resistant     CIPROFLOXACIN 0.5 INTERMEDIATE Intermediate     GENTAMICIN <=1 SENSITIVE Sensitive     IMIPENEM <=0.25 SENSITIVE Sensitive     TRIMETH/SULFA >=320 RESISTANT Resistant     AMPICILLIN/SULBACTAM 4 SENSITIVE Sensitive     PIP/TAZO <=4 SENSITIVE Sensitive ug/mL    * FEW KLEBSIELLA PNEUMONIAE  Culture, blood (Routine X 2) w Reflex to ID Panel     Status: None   Collection Time: 12/30/22  3:49 PM   Specimen: BLOOD RIGHT ARM  Result Value Ref Range Status   Specimen Description BLOOD RIGHT ARM  Final   Special Requests   Final    BOTTLES DRAWN AEROBIC ONLY Blood Culture results may not be optimal due to an inadequate volume of blood received in culture bottles   Culture   Final    NO GROWTH 5 DAYS Performed at [facility], [address], [place], [place] [address]    Report Status 01/04/2023 FINAL  Final  Culture, blood (Routine X 2) w Reflex to ID Panel     Status: None   Collection Time: 12/30/22  3:49 PM   Specimen: BLOOD RIGHT HAND  Result Value Ref Range Status   Specimen Description BLOOD RIGHT HAND  Final   Special Requests   Final    BOTTLES DRAWN AEROBIC ONLY Blood Culture adequate volume   Culture   Final    NO GROWTH 5 DAYS Performed at [facility], [address] [place], [place] [address]    Report Status 01/04/2023 FINAL  Final    RADIOLOGY STUDIES/RESULTS: No results found.   LOS: 10 days   Jeoffrey Massed, MD  [facility]    To contact the attending  provider between 7A-7P or the covering provider during after hours 7P-7A, please log into the web site [email or web address] and access using universal Anton password for that web site. If you do not have the password, please call the hospital operator.  01/07/2023, 12:00 PM

## 2023-01-08 DIAGNOSIS — I4891 Unspecified atrial fibrillation: Secondary | ICD-10-CM | POA: Diagnosis not present

## 2023-01-08 DIAGNOSIS — M009 Pyogenic arthritis, unspecified: Secondary | ICD-10-CM | POA: Diagnosis not present

## 2023-01-08 DIAGNOSIS — I480 Paroxysmal atrial fibrillation: Secondary | ICD-10-CM | POA: Diagnosis not present

## 2023-01-08 DIAGNOSIS — I10 Essential (primary) hypertension: Secondary | ICD-10-CM | POA: Diagnosis not present

## 2023-01-08 MED ORDER — POLYETHYLENE GLYCOL 3350 17 G PO PACK
17.0000 g | PACK | Freq: Every day | ORAL | Status: DC
  Administered 2023-01-08 – 2023-02-02 (×13): 17 g via ORAL
  Filled 2023-01-08 (×18): qty 1

## 2023-01-08 MED ORDER — MIDODRINE HCL 5 MG PO TABS
10.0000 mg | ORAL_TABLET | Freq: Three times a day (TID) | ORAL | Status: DC
  Administered 2023-01-08 – 2023-02-03 (×75): 10 mg via ORAL
  Filled 2023-01-08 (×68): qty 2

## 2023-01-08 MED ORDER — METOPROLOL TARTRATE 50 MG PO TABS
50.0000 mg | ORAL_TABLET | Freq: Two times a day (BID) | ORAL | Status: DC
  Administered 2023-01-08 – 2023-02-04 (×43): 50 mg via ORAL
  Filled 2023-01-08 (×52): qty 1

## 2023-01-08 NOTE — Progress Notes (Signed)
PROGRESS NOTE        PATIENT DETAILS Name: Jonathan Ibarra Age: 57 y.o. Sex: male Date of Birth: May 16, 1965 Admit Date: 12/27/2022 Admitting Physician Osvaldo Shipper, MD [id number], [name], MD  Brief Summary: Patient is a 57 y.o.  male with history of liver cirrhosis, HTN, polysubstance abuse-recent hospitalization from 10/13-10/18 following trauma with fall resulting in a right tibia plateau fracture requiring ORIF on 10/14-presented with purulent discharge from his right knee-he was thought to have septic arthritis and subsequently admitted to the hospitalist service.  Significant events: 10/13-10/18 >> hospitalization-right tibial plateau fracture following a fall-s/p ORIF.   10/28>> purulent discharge from right knee-septic arthritis-admit to [facility]  Significant studies: 08/02>> echo: EF 55-60% 10/28>> x-ray right knee: Unchanged lateral plate/screw fixation across comminuted fracture involving medial/lateral tibial plateaus, moderate knee joint effusion 10/29>> CXR: No active disease  Significant microbiology data: 10/29>> blood culture: 1/2-Corynebacterium (contamination) 10/30>> right knee abscess culture: Klebsiella pneumoniae-ESBL 10/31>> blood culture: No growth  Procedures: 10/30>> irrigation and debridement of right knee  Consults: Ortho recs Infectious disease  Subjective: No chest pain-no shortness of breath-lying comfortably.  Objective: Vitals: Blood pressure 98/68, pulse 92, temperature 98.1 F (36.7 C), temperature source Oral, resp. rate 12, height 6' (1.829 m), weight 123.6 kg, SpO2 96%.   Exam: Awake/alert-no complaints  Pertinent Labs/Radiology:    Latest Ref Rng & Units 01/06/2023    4:51 AM 01/04/2023    4:30 AM 12/31/2022    3:20 AM  CBC  WBC 4.0 - 10.5 K/uL 5.0  4.1  6.3   Hemoglobin 13.0 - 17.0 g/dL 29.5  62.1  30.8   Hematocrit 39.0 - 52.0 % 31.3  32.3  32.1   Platelets 150 - 400 K/uL 236  226  313     Lab  Results  Component Value Date   NA 134 (L) 01/06/2023   K 4.2 01/06/2023   CL 102 01/06/2023   CO2 27 01/06/2023      Assessment/Plan: Septic arthritis right knee (ESBL Klebsiella pneumoniae)-with recent history of ORIF on 10/14 with hardware in place S/p I&D 10/30-VAC discontinued 11/4 ID recommending Invanz-EOT 12/10-will need to call ID 1 or 2 days prior to EOT for final recommendations prior to discharge (may require suppressive antibiotic) Not a candidate for home IV therapies given history of drug use Ortho following-WBAT as tolerated  PAF with RVR Rate controlled with oral metoprolol-however blood pressure soft today-decrease dose of metoprolol-increase dose of midodrine Check CBC with a.m. labs but no evidence of bleeding. Not a candidate for anticoagulation-in any events CHADS2-VASC score of 1  Liver cirrhosis-s/p TIPS Relatively well compensated Now having BM with changes lactulose-continue 30 g 3 times a day.  Diuretics on hold due to soft BP (on midodrine for BP support)  Polysubstance abuse Acknowledges ongoing meth/THC use and remote cocaine use-although UDS positive for cocaine Ongoing counseling  Obesity: Estimated body mass index is 36.96 kg/m as calculated from the following:   Height as of this encounter: 6' (1.829 m).   Weight as of this encounter: 123.6 kg.   Code status:   Code Status: Full Code   DVT Prophylaxis: enoxaparin (LOVENOX) injection 40 mg Start: 12/30/22 0900 SCDs Start: 12/28/22 0816   Family Communication: None at bedside   Disposition Plan: Status is: Inpatient Remains inpatient appropriate because: Severity of illness   Planned Discharge Destination:Home  Diet: Diet Order             Diet regular Room service appropriate? Yes; Fluid consistency: Thin  Diet effective now                     Antimicrobial agents: Anti-infectives (From admission, onward)    Start     Dose/Rate Route Frequency Ordered Stop    12/31/22 1545  ertapenem (INVANZ) 1 g in sodium chloride 0.9 % 100 mL IVPB        1 g 200 mL/hr over 30 Minutes Intravenous Every 24 hours 12/31/22 1546     12/31/22 1100  ertapenem (INVANZ) 1 g in sodium chloride 0.9 % 100 mL IVPB  Status:  Discontinued        1 g 200 mL/hr over 30 Minutes Intravenous Every 24 hours 12/31/22 0931 12/31/22 1546   12/30/22 1400  cefTRIAXone (ROCEPHIN) 2 g in sodium chloride 0.9 % 100 mL IVPB  Status:  Discontinued        2 g 200 mL/hr over 30 Minutes Intravenous Every 24 hours 12/30/22 1056 12/31/22 0931   12/29/22 1000  tobramycin (NEBCIN) powder  Status:  Discontinued          As needed 12/29/22 1022 12/29/22 1035   12/29/22 1000  vancomycin (VANCOCIN) powder  Status:  Discontinued          As needed 12/29/22 1023 12/29/22 1035   12/28/22 1400  piperacillin-tazobactam (ZOSYN) IVPB 3.375 g  Status:  Discontinued        3.375 g 12.5 mL/hr over 240 Minutes Intravenous Every 8 hours 12/28/22 1202 12/30/22 1056   12/28/22 1300  vancomycin (VANCOCIN) IVPB 1000 mg/200 mL premix  Status:  Discontinued        1,000 mg 200 mL/hr over 60 Minutes Intravenous Every 8 hours 12/28/22 1202 12/31/22 1029   12/28/22 0900  doxycycline (VIBRAMYCIN) 100 mg in dextrose 5 % 250 mL IVPB  Status:  Discontinued        100 mg 125 mL/hr over 120 Minutes Intravenous Every 12 hours 12/28/22 0826 12/28/22 1141   12/28/22 0115  vancomycin (VANCOCIN) IVPB 1000 mg/200 mL premix  Status:  Discontinued        1,000 mg 200 mL/hr over 60 Minutes Intravenous  Once 12/28/22 0107 12/28/22 0108   12/28/22 0115  piperacillin-tazobactam (ZOSYN) IVPB 3.375 g        3.375 g 100 mL/hr over 30 Minutes Intravenous  Once 12/28/22 0107 12/28/22 0142   12/28/22 0115  vancomycin (VANCOREADY) IVPB 2000 mg/400 mL        2,000 mg 200 mL/hr over 120 Minutes Intravenous  Once 12/28/22 0108 12/28/22 0342   12/28/22 0100  doxycycline (VIBRA-TABS) tablet 100 mg        100 mg Oral  Once 12/28/22 0049 12/28/22  0054        MEDICATIONS: Scheduled Meds:  cholecalciferol  1,000 Units Oral Daily   enoxaparin (LOVENOX) injection  40 mg Subcutaneous Q24H   folic acid  1 mg Oral Daily   lactulose  30 g Oral TID   metoprolol tartrate  100 mg Oral BID   midodrine  5 mg Oral TID WC   polyethylene glycol  17 g Oral Daily   sodium chloride flush  3 mL Intravenous Q12H   thiamine  100 mg Oral Daily   Vitamin D (Ergocalciferol)  50,000 Units Oral Q7 days   Continuous Infusions:  ertapenem 1 g (01/08/23 0839)  PRN Meds:.acetaminophen, bisacodyl, diltiazem, HYDROmorphone (DILAUDID) injection, lactulose, methocarbamol, oxyCODONE **OR** oxyCODONE, polyethylene glycol, sodium chloride flush   I have personally reviewed following labs and imaging studies  LABORATORY DATA: CBC: Recent Labs  Lab 01/04/23 0430 01/06/23 0451  WBC 4.1 5.0  HGB 11.0* 10.5*  HCT 32.3* 31.3*  MCV 89.7 89.2  PLT 226 236    Basic Metabolic Panel: Recent Labs  Lab 01/04/23 0430 01/06/23 0451  NA 132* 134*  K 3.9 4.2  CL 99 102  CO2 28 27  GLUCOSE 91 94  BUN 12 13  CREATININE 0.81 0.69  CALCIUM 8.3* 8.5*  MG 1.6*  --     GFR: Estimated Creatinine Clearance: 140 mL/min (by C-G formula based on SCr of 0.69 mg/dL).  Liver Function Tests: Recent Labs  Lab 01/06/23 0451  AST 24  ALT 10  ALKPHOS 106  BILITOT 1.0  PROT 6.1*  ALBUMIN 1.9*   No results for input(s): "LIPASE", "AMYLASE" in the last 168 hours. No results for input(s): "AMMONIA" in the last 168 hours.  Coagulation Profile: No results for input(s): "INR", "PROTIME" in the last 168 hours.  Cardiac Enzymes: No results for input(s): "CKTOTAL", "CKMB", "CKMBINDEX", "TROPONINI" in the last 168 hours.  BNP (last 3 results) No results for input(s): "PROBNP" in the last 8760 hours.  Lipid Profile: No results for input(s): "CHOL", "HDL", "LDLCALC", "TRIG", "CHOLHDL", "LDLDIRECT" in the last 72 hours.  Thyroid Function Tests: No results for  input(s): "TSH", "T4TOTAL", "FREET4", "T3FREE", "THYROIDAB" in the last 72 hours.  Anemia Panel: No results for input(s): "VITAMINB12", "FOLATE", "FERRITIN", "TIBC", "IRON", "RETICCTPCT" in the last 72 hours.  Urine analysis:    Component Value Date/Time   COLORURINE AMBER (A) 10/01/2022 0142   APPEARANCEUR CLOUDY (A) 10/01/2022 0142   LABSPEC 1.025 10/01/2022 0142   PHURINE 5.0 10/01/2022 0142   GLUCOSEU NEGATIVE 10/01/2022 0142   HGBUR SMALL (A) 10/01/2022 0142   BILIRUBINUR NEGATIVE 10/01/2022 0142   KETONESUR NEGATIVE 10/01/2022 0142   PROTEINUR 30 (A) 10/01/2022 0142   NITRITE NEGATIVE 10/01/2022 0142   LEUKOCYTESUR LARGE (A) 10/01/2022 0142    Sepsis Labs: Lactic Acid, Venous    Component Value Date/Time   LATICACIDVEN 1.5 12/12/2022 2311    MICROBIOLOGY: Recent Results (from the past 240 hour(s))  Culture, blood (Routine X 2) w Reflex to ID Panel     Status: None   Collection Time: 12/30/22  3:49 PM   Specimen: BLOOD RIGHT ARM  Result Value Ref Range Status   Specimen Description BLOOD RIGHT ARM  Final   Special Requests   Final    BOTTLES DRAWN AEROBIC ONLY Blood Culture results may not be optimal due to an inadequate volume of blood received in culture bottles   Culture   Final    NO GROWTH 5 DAYS Performed at [facility], [address], [place], [place] [address]    Report Status 01/04/2023 FINAL  Final  Culture, blood (Routine X 2) w Reflex to ID Panel     Status: None   Collection Time: 12/30/22  3:49 PM   Specimen: BLOOD RIGHT HAND  Result Value Ref Range Status   Specimen Description BLOOD RIGHT HAND  Final   Special Requests   Final    BOTTLES DRAWN AEROBIC ONLY Blood Culture adequate volume   Culture   Final    NO GROWTH 5 DAYS Performed at [facility], [address] [place], [place] [address]    Report Status  01/04/2023 FINAL  Final    RADIOLOGY STUDIES/RESULTS: No results found.   LOS: 11 days   Jeoffrey Massed,  MD  [facility]    To contact the attending provider between 7A-7P or the covering provider during after hours 7P-7A, please log into the web site [email or web address] and access using universal [place] password for that web site. If you do not have the password, please call the hospital operator.  01/08/2023, 10:56 AM

## 2023-01-08 NOTE — Plan of Care (Signed)
Pain continues in his right knee.

## 2023-01-08 NOTE — Plan of Care (Signed)
  Problem: Education: Goal: Knowledge of disease or condition will improve Outcome: Progressing Goal: Understanding of medication regimen will improve Outcome: Progressing   Problem: Activity: Goal: Ability to tolerate increased activity will improve Outcome: Progressing   Problem: Cardiac: Goal: Ability to achieve and maintain adequate cardiopulmonary perfusion will improve Outcome: Progressing   Problem: Health Behavior/Discharge Planning: Goal: Ability to manage health-related needs will improve Outcome: Progressing   Problem: Activity: Goal: Risk for activity intolerance will decrease Outcome: Progressing   Problem: Coping: Goal: Level of anxiety will decrease Outcome: Progressing   Problem: Elimination: Goal: Will not experience complications related to bowel motility Outcome: Progressing   Problem: Education: Goal: Knowledge of disease or condition will improve Outcome: Progressing Goal: Understanding of medication regimen will improve Outcome: Progressing   Problem: Activity: Goal: Ability to tolerate increased activity will improve Outcome: Progressing   Problem: Cardiac: Goal: Ability to achieve and maintain adequate cardiopulmonary perfusion will improve Outcome: Progressing   Problem: Health Behavior/Discharge Planning: Goal: Ability to manage health-related needs will improve Outcome: Progressing   Problem: Activity: Goal: Risk for activity intolerance will decrease Outcome: Progressing   Problem: Coping: Goal: Level of anxiety will decrease Outcome: Progressing   Problem: Elimination: Goal: Will not experience complications related to bowel motility Outcome: Progressing

## 2023-01-09 DIAGNOSIS — I480 Paroxysmal atrial fibrillation: Secondary | ICD-10-CM | POA: Diagnosis not present

## 2023-01-09 DIAGNOSIS — I4891 Unspecified atrial fibrillation: Secondary | ICD-10-CM | POA: Diagnosis not present

## 2023-01-09 DIAGNOSIS — M009 Pyogenic arthritis, unspecified: Secondary | ICD-10-CM | POA: Diagnosis not present

## 2023-01-09 DIAGNOSIS — I10 Essential (primary) hypertension: Secondary | ICD-10-CM | POA: Diagnosis not present

## 2023-01-09 LAB — COMPREHENSIVE METABOLIC PANEL
ALT: 14 U/L (ref 0–44)
AST: 21 U/L (ref 15–41)
Albumin: 2 g/dL — ABNORMAL LOW (ref 3.5–5.0)
Alkaline Phosphatase: 117 U/L (ref 38–126)
Anion gap: 6 (ref 5–15)
BUN: 10 mg/dL (ref 6–20)
CO2: 28 mmol/L (ref 22–32)
Calcium: 8.9 mg/dL (ref 8.9–10.3)
Chloride: 100 mmol/L (ref 98–111)
Creatinine, Ser: 0.68 mg/dL (ref 0.61–1.24)
GFR, Estimated: >60 mL/min (ref >60)
Glucose, Bld: 89 mg/dL (ref 70–99)
Potassium: 4.9 mmol/L (ref 3.5–5.1)
Sodium: 134 mmol/L — ABNORMAL LOW (ref 135–145)
Total Bilirubin: 0.8 mg/dL (ref <1.2)
Total Protein: 6.6 g/dL (ref 6.5–8.1)

## 2023-01-09 LAB — CBC
HCT: 33.8 % — ABNORMAL LOW (ref 39.0–52.0)
Hemoglobin: 11.1 g/dL — ABNORMAL LOW (ref 13.0–17.0)
MCH: 29.9 pg (ref 26.0–34.0)
MCHC: 32.8 g/dL (ref 30.0–36.0)
MCV: 91.1 fL (ref 80.0–100.0)
Platelets: 254 10*3/uL (ref 150–400)
RBC: 3.71 MIL/uL — ABNORMAL LOW (ref 4.22–5.81)
RDW: 14.4 % (ref 11.5–15.5)
WBC: 5.6 10*3/uL (ref 4.0–10.5)
nRBC: 0 % (ref 0.0–0.2)

## 2023-01-09 MED ORDER — HYDROMORPHONE HCL 1 MG/ML IJ SOLN
0.5000 mg | INTRAMUSCULAR | Status: DC | PRN
  Administered 2023-01-09 – 2023-01-28 (×65): 0.5 mg via INTRAVENOUS
  Filled 2023-01-09 (×70): qty 0.5

## 2023-01-09 NOTE — Progress Notes (Signed)
PROGRESS NOTE        PATIENT DETAILS Name: Jonathan Ibarra Age: 57 y.o. Sex: male Date of Birth: Oct 27, 1965 Admit Date: 12/27/2022 Admitting Physician Osvaldo Shipper, MD [id number], [name], MD  Brief Summary: Patient is a 57 y.o.  male with history of liver cirrhosis, HTN, polysubstance abuse-recent hospitalization from 10/13-10/18 following trauma with fall resulting in a right tibia plateau fracture requiring ORIF on 10/14-presented with purulent discharge from his right knee-he was thought to have septic arthritis and subsequently admitted to the hospitalist service.  Significant events: 10/13-10/18 >> hospitalization-right tibial plateau fracture following a fall-s/p ORIF.   10/28>> purulent discharge from right knee-septic arthritis-admit to [facility]  Significant studies: 08/02>> echo: EF 55-60% 10/28>> x-ray right knee: Unchanged lateral plate/screw fixation across comminuted fracture involving medial/lateral tibial plateaus, moderate knee joint effusion 10/29>> CXR: No active disease  Significant microbiology data: 10/29>> blood culture: 1/2-Corynebacterium (contamination) 10/30>> right knee abscess culture: Klebsiella pneumoniae-ESBL 10/31>> blood culture: No growth  Procedures: 10/30>> irrigation and debridement of right knee  Consults: Ortho recs Infectious disease  Subjective: No major issues overnight-complains of pain in the right knee.  Apparently had multiple BMs yesterday.  Objective: Vitals: Blood pressure 126/86, pulse 93, temperature 97.8 F (36.6 C), temperature source Oral, resp. rate 18, height 6' (1.829 m), weight 123.6 kg, SpO2 92%.   Exam: Awake/alert Nonfocal exam  Pertinent Labs/Radiology:    Latest Ref Rng & Units 01/09/2023    6:38 AM 01/06/2023    4:51 AM 01/04/2023    4:30 AM  CBC  WBC 4.0 - 10.5 K/uL 5.6  5.0  4.1   Hemoglobin 13.0 - 17.0 g/dL 54.0  98.1  19.1   Hematocrit 39.0 - 52.0 % 33.8  31.3  32.3    Platelets 150 - 400 K/uL 254  236  226     Lab Results  Component Value Date   NA 134 (L) 01/09/2023   K 4.9 01/09/2023   CL 100 01/09/2023   CO2 28 01/09/2023      Assessment/Plan: Septic arthritis right knee (ESBL Klebsiella pneumoniae)-with recent history of ORIF on 10/14 with hardware in place S/p I&D 10/30-VAC discontinued 11/4 ID recommending Invanz-EOT 12/10-will need to call ID 1 or 2 days prior to EOT for final recommendations prior to discharge (may require suppressive antibiotic) Not a candidate for home IV therapies given history of drug use Ortho following-WBAT as tolerated  PAF with RVR Rate controlled with oral metoprolol-continue midodrine for BP support Not a candidate for anticoagulation-in any events CHADS2-VASC score of 1  Liver cirrhosis-s/p TIPS Relatively well compensated Now having BM with changes lactulose-continue 30 g 3 times a day.  Diuretics on hold due to soft BP (on midodrine for BP support)  Polysubstance abuse Acknowledges ongoing meth/THC use and remote cocaine use-although UDS positive for cocaine Ongoing counseling  Obesity: Estimated body mass index is 36.96 kg/m as calculated from the following:   Height as of this encounter: 6' (1.829 m).   Weight as of this encounter: 123.6 kg.   Code status:   Code Status: Full Code   DVT Prophylaxis: enoxaparin (LOVENOX) injection 40 mg Start: 12/30/22 0900 SCDs Start: 12/28/22 0816   Family Communication: None at bedside   Disposition Plan: Status is: Inpatient Remains inpatient appropriate because: Severity of illness   Planned Discharge Destination:Home   Diet: Diet Order  Diet regular Room service appropriate? Yes; Fluid consistency: Thin  Diet effective now                     Antimicrobial agents: Anti-infectives (From admission, onward)    Start     Dose/Rate Route Frequency Ordered Stop   12/31/22 1545  ertapenem (INVANZ) 1 g in sodium chloride 0.9 %  100 mL IVPB        1 g 200 mL/hr over 30 Minutes Intravenous Every 24 hours 12/31/22 1546     12/31/22 1100  ertapenem (INVANZ) 1 g in sodium chloride 0.9 % 100 mL IVPB  Status:  Discontinued        1 g 200 mL/hr over 30 Minutes Intravenous Every 24 hours 12/31/22 0931 12/31/22 1546   12/30/22 1400  cefTRIAXone (ROCEPHIN) 2 g in sodium chloride 0.9 % 100 mL IVPB  Status:  Discontinued        2 g 200 mL/hr over 30 Minutes Intravenous Every 24 hours 12/30/22 1056 12/31/22 0931   12/29/22 1000  tobramycin (NEBCIN) powder  Status:  Discontinued          As needed 12/29/22 1022 12/29/22 1035   12/29/22 1000  vancomycin (VANCOCIN) powder  Status:  Discontinued          As needed 12/29/22 1023 12/29/22 1035   12/28/22 1400  piperacillin-tazobactam (ZOSYN) IVPB 3.375 g  Status:  Discontinued        3.375 g 12.5 mL/hr over 240 Minutes Intravenous Every 8 hours 12/28/22 1202 12/30/22 1056   12/28/22 1300  vancomycin (VANCOCIN) IVPB 1000 mg/200 mL premix  Status:  Discontinued        1,000 mg 200 mL/hr over 60 Minutes Intravenous Every 8 hours 12/28/22 1202 12/31/22 1029   12/28/22 0900  doxycycline (VIBRAMYCIN) 100 mg in dextrose 5 % 250 mL IVPB  Status:  Discontinued        100 mg 125 mL/hr over 120 Minutes Intravenous Every 12 hours 12/28/22 0826 12/28/22 1141   12/28/22 0115  vancomycin (VANCOCIN) IVPB 1000 mg/200 mL premix  Status:  Discontinued        1,000 mg 200 mL/hr over 60 Minutes Intravenous  Once 12/28/22 0107 12/28/22 0108   12/28/22 0115  piperacillin-tazobactam (ZOSYN) IVPB 3.375 g        3.375 g 100 mL/hr over 30 Minutes Intravenous  Once 12/28/22 0107 12/28/22 0142   12/28/22 0115  vancomycin (VANCOREADY) IVPB 2000 mg/400 mL        2,000 mg 200 mL/hr over 120 Minutes Intravenous  Once 12/28/22 0108 12/28/22 0342   12/28/22 0100  doxycycline (VIBRA-TABS) tablet 100 mg        100 mg Oral  Once 12/28/22 0049 12/28/22 0054        MEDICATIONS: Scheduled Meds:   cholecalciferol  1,000 Units Oral Daily   enoxaparin (LOVENOX) injection  40 mg Subcutaneous Q24H   folic acid  1 mg Oral Daily   lactulose  30 g Oral TID   metoprolol tartrate  50 mg Oral BID   midodrine  10 mg Oral TID WC   polyethylene glycol  17 g Oral Daily   sodium chloride flush  3 mL Intravenous Q12H   thiamine  100 mg Oral Daily   Vitamin D (Ergocalciferol)  50,000 Units Oral Q7 days   Continuous Infusions:  ertapenem 1 g (01/09/23 1031)   PRN Meds:.acetaminophen, bisacodyl, diltiazem, HYDROmorphone (DILAUDID) injection, lactulose, methocarbamol, oxyCODONE **OR** oxyCODONE, polyethylene  glycol, sodium chloride flush   I have personally reviewed following labs and imaging studies  LABORATORY DATA: CBC: Recent Labs  Lab 01/04/23 0430 01/06/23 0451 01/09/23 0638  WBC 4.1 5.0 5.6  HGB 11.0* 10.5* 11.1*  HCT 32.3* 31.3* 33.8*  MCV 89.7 89.2 91.1  PLT 226 236 254    Basic Metabolic Panel: Recent Labs  Lab 01/04/23 0430 01/06/23 0451 01/09/23 0638  NA 132* 134* 134*  K 3.9 4.2 4.9  CL 99 102 100  CO2 28 27 28   GLUCOSE 91 94 89  BUN 12 13 10   CREATININE 0.81 0.69 0.68  CALCIUM 8.3* 8.5* 8.9  MG 1.6*  --   --     GFR: Estimated Creatinine Clearance: 140 mL/min (by C-G formula based on SCr of 0.68 mg/dL).  Liver Function Tests: Recent Labs  Lab 01/06/23 0451 01/09/23 0638  AST 24 21  ALT 10 14  ALKPHOS 106 117  BILITOT 1.0 0.8  PROT 6.1* 6.6  ALBUMIN 1.9* 2.0*   No results for input(s): "LIPASE", "AMYLASE" in the last 168 hours. No results for input(s): "AMMONIA" in the last 168 hours.  Coagulation Profile: No results for input(s): "INR", "PROTIME" in the last 168 hours.  Cardiac Enzymes: No results for input(s): "CKTOTAL", "CKMB", "CKMBINDEX", "TROPONINI" in the last 168 hours.  BNP (last 3 results) No results for input(s): "PROBNP" in the last 8760 hours.  Lipid Profile: No results for input(s): "CHOL", "HDL", "LDLCALC", "TRIG",  "CHOLHDL", "LDLDIRECT" in the last 72 hours.  Thyroid Function Tests: No results for input(s): "TSH", "T4TOTAL", "FREET4", "T3FREE", "THYROIDAB" in the last 72 hours.  Anemia Panel: No results for input(s): "VITAMINB12", "FOLATE", "FERRITIN", "TIBC", "IRON", "RETICCTPCT" in the last 72 hours.  Urine analysis:    Component Value Date/Time   COLORURINE AMBER (A) 10/01/2022 0142   APPEARANCEUR CLOUDY (A) 10/01/2022 0142   LABSPEC 1.025 10/01/2022 0142   PHURINE 5.0 10/01/2022 0142   GLUCOSEU NEGATIVE 10/01/2022 0142   HGBUR SMALL (A) 10/01/2022 0142   BILIRUBINUR NEGATIVE 10/01/2022 0142   KETONESUR NEGATIVE 10/01/2022 0142   PROTEINUR 30 (A) 10/01/2022 0142   NITRITE NEGATIVE 10/01/2022 0142   LEUKOCYTESUR LARGE (A) 10/01/2022 0142    Sepsis Labs: Lactic Acid, Venous    Component Value Date/Time   LATICACIDVEN 1.5 12/12/2022 2311    MICROBIOLOGY: Recent Results (from the past 240 hour(s))  Culture, blood (Routine X 2) w Reflex to ID Panel     Status: None   Collection Time: 12/30/22  3:49 PM   Specimen: BLOOD RIGHT ARM  Result Value Ref Range Status   Specimen Description BLOOD RIGHT ARM  Final   Special Requests   Final    BOTTLES DRAWN AEROBIC ONLY Blood Culture results may not be optimal due to an inadequate volume of blood received in culture bottles   Culture   Final    NO GROWTH 5 DAYS Performed at [facility], [address], [place], [place] [address]    Report Status 01/04/2023 FINAL  Final  Culture, blood (Routine X 2) w Reflex to ID Panel     Status: None   Collection Time: 12/30/22  3:49 PM   Specimen: BLOOD RIGHT HAND  Result Value Ref Range Status   Specimen Description BLOOD RIGHT HAND  Final   Special Requests   Final    BOTTLES DRAWN AEROBIC ONLY Blood Culture adequate volume   Culture   Final    NO GROWTH 5 DAYS Performed at [facility], [address], [place], [place] [address]    Report Status 01/04/2023 FINAL  Final     RADIOLOGY STUDIES/RESULTS: No results found.   LOS: 12 days   Jeoffrey Massed, MD  [facility]    To contact the attending provider between 7A-7P or the covering provider during after hours 7P-7A, please log into the web site [email or web address] and access using universal [place] password for that web site. If you do not have the password, please call the hospital operator.  01/09/2023, 10:49 AM

## 2023-01-10 ENCOUNTER — Other Ambulatory Visit: Payer: Self-pay

## 2023-01-10 DIAGNOSIS — M00861 Arthritis due to other bacteria, right knee: Secondary | ICD-10-CM | POA: Diagnosis not present

## 2023-01-10 DIAGNOSIS — I4891 Unspecified atrial fibrillation: Secondary | ICD-10-CM | POA: Diagnosis not present

## 2023-01-10 DIAGNOSIS — T148XXA Other injury of unspecified body region, initial encounter: Secondary | ICD-10-CM | POA: Diagnosis not present

## 2023-01-10 DIAGNOSIS — I10 Essential (primary) hypertension: Secondary | ICD-10-CM | POA: Diagnosis not present

## 2023-01-10 MED ORDER — OXYCODONE HCL 5 MG PO TABS
15.0000 mg | ORAL_TABLET | Freq: Four times a day (QID) | ORAL | Status: DC | PRN
  Administered 2023-01-10 – 2023-02-04 (×55): 15 mg via ORAL
  Filled 2023-01-10 (×59): qty 3

## 2023-01-10 MED ORDER — ORAL CARE MOUTH RINSE: 15.0000 mL | OROMUCOSAL | Status: DC | PRN

## 2023-01-10 MED ORDER — OXYCODONE HCL 5 MG PO TABS: 10.0000 mg | ORAL_TABLET | ORAL | Status: DC | PRN

## 2023-01-10 NOTE — Progress Notes (Signed)
Occupational Therapy Treatment Patient Details Name: Jonathan Ibarra MRN: [id number] DOB: February 25, 1966 Today's Date: 01/10/2023   History of present illness Pt is a 57 y/o male presenting 10/28 to [facility] with severe right knee pain and drainage after a fall the night PTA, he is now s/p R knee I&D on 12/29/22. He recently had a R tibial ORIF on 12/13/22 after a falls incidence. ONG:EXBMWUX abuse, [name], HTN, liver cirrhosis, polysubstance abuse.   OT comments  On second attempt reported they would attempt activity. Pt was able to complete squat pivot to chair with CGA and sit to stand transfer with CGA. Pt was educated about AE for LB dressing due to pain and decrease in mobility. Pt then agreed to complete room level mobility with CGA and use of RW. Acute Occupational Therapy will continue to follow.       If plan is discharge home, recommend the following:  Assistance with cooking/housework;Help with stairs or ramp for entrance;Assist for transportation;[name];A little help with walking and/or transfers   Equipment Recommendations  Tub/shower bench    Recommendations for Other Services      Precautions / Restrictions Precautions Precautions: Fall Restrictions Weight Bearing Restrictions: Yes RLE Weight Bearing: Non weight bearing Other Position/Activity Restrictions: no formal orders but was NWB last admit during 10/16 notes       Mobility Bed Mobility Overal bed mobility: Needs Assistance Bed Mobility: Supine to Sit     Supine to sit: Contact guard, [name]     General bed mobility comments: pt was able to use LLE to assist with RLE mobility to EOB due to pain and limited AROM    Transfers Overall transfer level: Needs assistance Equipment used: Rolling walker (2 wheels) Transfers: Sit to/from Stand Sit to Stand: Contact guard assist, From elevated surface                 Balance Overall balance assessment: Needs  assistance Sitting-balance support: No upper extremity supported, Feet supported Sitting balance-Leahy Scale: Good Sitting balance - Comments: sitting EOB   Standing balance support: Bilateral upper extremity supported Standing balance-Leahy Scale: Fair Standing balance comment: reliant on RW support                           ADL either performed or assessed with clinical judgement   ADL Overall ADL's : Needs assistance/impaired Eating/Feeding: Independent   Grooming: Set up;Sitting   Upper Body Bathing: Set up;Sitting   Lower Body Bathing: Minimal assistance;Sitting/lateral leans;Sit to/from stand Lower Body Bathing Details (indicate cue type and reason): for RLE due to pain Upper Body Dressing : Set up;Sitting   Lower Body Dressing: Moderate assistance;Sit to/from stand;Sitting/lateral leans   Toilet Transfer: Contact guard assist;Rolling walker (2 wheels)   Toileting- Clothing Manipulation and Hygiene: Contact guard assist;Sitting/lateral lean       Functional mobility during ADLs: Contact guard assist;Rolling walker (2 wheels)      Extremity/Trunk Assessment Upper Extremity Assessment Upper Extremity Assessment: Overall WFL for tasks assessed   Lower Extremity Assessment Lower Extremity Assessment: Defer to PT evaluation        Vision       Perception     Praxis      Cognition Arousal: Alert Behavior During Therapy: [facility] for tasks assessed/performed Overall Cognitive Status: Within Functional Limits for tasks assessed  General Comments: Requires cues for safety        Exercises      Shoulder Instructions       General Comments      Pertinent Vitals/ Pain       Pain Assessment Pain Assessment: Faces Faces Pain Scale: Hurts even more Pain Location: R knee Pain Descriptors / Indicators: Discomfort, Aching, Moaning Pain Intervention(s): Limited activity within patient's tolerance,  Monitored during session, Repositioned, Premedicated before session, Patient requesting pain meds-RN notified  Home Living                                          Prior Functioning/Environment              Frequency  Min 1X/week        Progress Toward Goals  OT Goals(current goals can now be found in the care plan section)  Progress towards OT goals: Progressing toward goals  Acute Rehab OT Goals Patient Stated Goal: to get a bath OT Goal Formulation: With patient Time For Goal Achievement: 01/12/23 Potential to Achieve Goals: Good ADL Goals Pt Will Perform Grooming: with modified independence;sitting Pt Will Perform Lower Body Bathing: with modified independence;sitting/lateral leans Pt Will Perform Lower Body Dressing: with modified independence;sitting/lateral leans Pt Will Transfer to Toilet: ambulating;with supervision;bedside commode Pt Will Perform Toileting - Clothing Manipulation and hygiene: sit to/from stand;with modified independence;sitting/lateral leans Pt Will Perform Tub/Shower Transfer: with modified independence;Tub transfer;tub bench;rolling walker  Plan      Co-evaluation                 AM-PAC OT "6 Clicks" Daily Activity     Outcome Measure   Help from another person eating meals?: None Help from another person taking care of personal grooming?: A Little Help from another person toileting, which includes using toliet, bedpan, or urinal?: A Little Help from another person bathing (including washing, rinsing, drying)?: A Lot Help from another person to put on and taking off regular upper body clothing?: A Little Help from another person to put on and taking off regular lower body clothing?: A Lot 6 Click Score: 17    End of Session Equipment Utilized During Treatment: Gait belt;Rolling walker (2 wheels)  OT Visit Diagnosis: Pain;Unsteadiness on feet (R26.81);Other abnormalities of gait and mobility (R26.89);History of  falling (Z91.81) Pain - Right/Left: Right Pain - part of body: Knee   Activity Tolerance Patient tolerated treatment well   Patient Left in chair;with call bell/phone within reach   Nurse Communication Mobility status        Time: 1000-1048 OT Time Calculation (min): 48 min  Charges: OT General Charges $OT Visit: 1 Visit OT Treatments $Self Care/Home Management : 38-52 mins  Presley Raddle OTR/L  Acute Rehab Services  [phone] office number   Alphia Moh 01/10/2023, 10:58 AM

## 2023-01-10 NOTE — Progress Notes (Signed)
PT Cancellation Note  Patient Details Name: Jonathan Ibarra MRN: [id number] DOB: 05/24/1965   Cancelled Treatment:    Reason Eval/Treat Not Completed: Patient declined, no reason specified. Pt reported he was tired, already saw OT today and despite education stated he need to rest.  Jonathan continue to follow up as able and appropriate.   Harrel Carina, DPT, CLT  Acute Rehabilitation Services Office: [phone] (Secure chat preferred)   Claudia Desanctis 01/10/2023, 4:29 PM

## 2023-01-10 NOTE — Progress Notes (Signed)
Orthopaedic Trauma Progress Note  SUBJECTIVE: Doing ok. Continues to note pain is an issue. Has been mobilizing well with therapies.  Tolerating his antibiotics. Wants to take a shower, states it has been over a month since he had one. No other specific concerns or complaints this morning.  Wound culture growing klebsiella pneumoniae, infectious disease team continuing to explore antibiotic options.  OBJECTIVE:  Vitals:   01/09/23 2000 01/10/23 0817  BP: 124/80 121/82  Pulse: 93   Resp: 18   Temp: 98 F (36.7 C) 97.9 F (36.6 C)  SpO2: 98% 98%    General: Sitting up in bed, no acute distress Respiratory: No increased work of breathing.  Right lower extremity:  Swelling about the knee stable. Dressing changed, incision as below. Continues to have copious amount of thick drainage. Ankle dorsiflexion/plantarflexion is intact. No significant increase in pain with passive stretch of the toes.  Global soreness/tenderness to the calf but no areas of significant increase in pain.  +EHL/FHL intact.  Neurovascularly intact.   IMAGING: Repeat imaging right knee performed 12/27/2022 shows stable appearance to plate and screw fixation.  Fracture appears stable.  No change in alignment.  No evidence of new fracture.  Moderate knee joint effusion.  LABS:  No results found for this or any previous visit (from the past 24 hour(s)).   ASSESSMENT: Jonathan Ibarra is a 57 y.o. male s/p IRRIGATION AND DEBRIDEMENT RIGHT KNEE on 12/29/22  Previous ORIF of right tibial plateau fracture with repair of right lateral meniscus tear on 12/13/2022  PLAN: Weightbearing: NWB RLE ROM: Okay for range of motion of the knee as tolerated Incisional and dressing care: Dressing changed today. Continue daily dressing changes by nursing Showering: Ok to shower with wound covered.  Orthopedic device(s): None Pain management:  1. Tylenol 325 mg q 6 hours PRN 2. Robaxin 750 mg q 6 hours PRN 3. Oxycodone 5-10 mg q 4  hours PRN 4. Toradol 30 mg every 6 hours x 3 days 5. Dilaudid 0.5-1 mg q 3 hours PRN VTE prophylaxis: Lovenox.  SCDs ID: Per infectious disease Foley/Lines:  No foley, KVO IVFs Impediments to Fracture Healing:  Infection.  Vitamin D level 28, continue supplementation Dispo: PT/OT as able. Due to active drug use, patient not a candidate for PICC line. Infectious disease following, appreciate their assistance with antibiotic management. Plan for repeat I&D right knee on Wednesday 01/12/23.  Patient will ultimately need HW removal once fracture healed.   Follow - up plan: 2 weeks after d/c    Contact information:  Truitt Merle MD, Thyra Breed PA-C. After hours and holidays please check [email or web address] for group call information for [facility]   Thompson Caul, PA-C [phone] (office) [email or web address]

## 2023-01-10 NOTE — Progress Notes (Signed)
OT Cancellation Note  Patient Details Name: [name] MRN: [id number] DOB: [birth year]   Cancelled Treatment:    Reason Eval/Treat Not Completed: Other (comment) (Pt reported hey been up and down all night and they are in pain but had pain medication. Pt educated on timing of therapy and medications but still declined. Will continue to follow.) Presley Raddle OTR/L  Acute Rehab Services  [phone] office number   Alphia Moh 01/10/2023, 8:46 AM

## 2023-01-10 NOTE — Progress Notes (Signed)
Toniann Fail, RN and Maralyn Sago, RN bedside RN made aware that PICC will not be placed tonight via secure chat.  PICC team will follow up tomorrow for placement.

## 2023-01-10 NOTE — H&P (View-Only) (Signed)
Orthopaedic Trauma Progress Note  SUBJECTIVE: Doing ok. Continues to note pain is an issue. Has been mobilizing well with therapies.  Tolerating his antibiotics. Wants to take a shower, states it has been over a month since he had one. No other specific concerns or complaints this morning.  Wound culture growing klebsiella pneumoniae, infectious disease team continuing to explore antibiotic options.  OBJECTIVE:  Vitals:   01/09/23 2000 01/10/23 0817  BP: 124/80 121/82  Pulse: 93   Resp: 18   Temp: 98 F (36.7 C) 97.9 F (36.6 C)  SpO2: 98% 98%    General: Sitting up in bed, no acute distress Respiratory: No increased work of breathing.  Right lower extremity:  Swelling about the knee stable. Dressing changed, incision as below. Continues to have copious amount of thick drainage. Ankle dorsiflexion/plantarflexion is intact. No significant increase in pain with passive stretch of the toes.  Global soreness/tenderness to the calf but no areas of significant increase in pain.  +EHL/FHL intact.  Neurovascularly intact.   IMAGING: Repeat imaging right knee performed 12/27/2022 shows stable appearance to plate and screw fixation.  Fracture appears stable.  No change in alignment.  No evidence of new fracture.  Moderate knee joint effusion.  LABS:  No results found for this or any previous visit (from the past 24 hour(s)).   ASSESSMENT: Shivaan Duane is a 57 y.o. male s/p IRRIGATION AND DEBRIDEMENT RIGHT KNEE on 12/29/22  Previous ORIF of right tibial plateau fracture with repair of right lateral meniscus tear on 12/13/2022  PLAN: Weightbearing: NWB RLE ROM: Okay for range of motion of the knee as tolerated Incisional and dressing care: Dressing changed today. Continue daily dressing changes by nursing Showering: Ok to shower with wound covered.  Orthopedic device(s): None Pain management:  1. Tylenol 325 mg q 6 hours PRN 2. Robaxin 750 mg q 6 hours PRN 3. Oxycodone 5-10 mg q 4  hours PRN 4. Toradol 30 mg every 6 hours x 3 days 5. Dilaudid 0.5-1 mg q 3 hours PRN VTE prophylaxis: Lovenox.  SCDs ID: Per infectious disease Foley/Lines:  No foley, KVO IVFs Impediments to Fracture Healing:  Infection.  Vitamin D level 28, continue supplementation Dispo: PT/OT as able. Due to active drug use, patient not a candidate for PICC line. Infectious disease following, appreciate their assistance with antibiotic management. Plan for repeat I&D right knee on Wednesday 01/12/23.  Patient will ultimately need HW removal once fracture healed.   Follow - up plan: 2 weeks after d/c    Contact information:  Truitt Merle MD, Thyra Breed PA-C. After hours and holidays please check [email or web address] for group call information for [facility]   Thompson Caul, PA-C [phone] (office) [email or web address]

## 2023-01-10 NOTE — Progress Notes (Signed)
PROGRESS NOTE        PATIENT DETAILS Name: Jonathan Ibarra Age: 57 y.o. Sex: male Date of Birth: February 17, 1966 Admit Date: 12/27/2022 Admitting Physician Osvaldo Shipper, MD FAO:[id number], [name], MD  Brief Summary: Patient is a 57 y.o.  male with history of liver cirrhosis, HTN, polysubstance abuse-recent hospitalization from 10/13-10/18 following trauma with fall resulting in a right tibia plateau fracture requiring ORIF on 10/14-presented with purulent discharge from his right knee-he was thought to have septic arthritis and subsequently admitted to the hospitalist service.  Significant events: 10/13-10/18 >> hospitalization-right tibial plateau fracture following a fall-s/p ORIF.   10/28>> purulent discharge from right knee-septic arthritis-admit to [facility]  Significant studies: 08/02>> echo: EF 55-60% 10/28>> x-ray right knee: Unchanged lateral plate/screw fixation across comminuted fracture involving medial/lateral tibial plateaus, moderate knee joint effusion 10/29>> CXR: No active disease  Significant microbiology data: 10/29>> blood culture: 1/2-Corynebacterium (contamination) 10/30>> right knee abscess culture: Klebsiella pneumoniae-ESBL 10/31>> blood culture: No growth  Procedures: 10/30>> irrigation and debridement of right knee  Consults: Ortho recs Infectious disease  Subjective: Pain in the right knee continues-no other issues overnight.  Objective: Vitals: Blood pressure 121/82, pulse 93, temperature 97.9 F (36.6 C), temperature source Oral, resp. rate 18, height 6' (1.829 m), weight 123.6 kg, SpO2 98%.   Exam: Awake/alert Nonfocal exam  Pertinent Labs/Radiology:    Latest Ref Rng & Units 01/09/2023    6:38 AM 01/06/2023    4:51 AM 01/04/2023    4:30 AM  CBC  WBC 4.0 - 10.5 K/uL 5.6  5.0  4.1   Hemoglobin 13.0 - 17.0 g/dL 57.8  46.9  62.9   Hematocrit 39.0 - 52.0 % 33.8  31.3  32.3   Platelets 150 - 400 K/uL 254  236  226      Lab Results  Component Value Date   NA 134 (L) 01/09/2023   K 4.9 01/09/2023   CL 100 01/09/2023   CO2 28 01/09/2023      Assessment/Plan: Septic arthritis right knee (ESBL Klebsiella pneumoniae)-with recent history of ORIF on 10/14 with hardware in place S/p I&D 10/30-VAC discontinued 11/4 Evaluated by orthopedics on 11/11-purulence observed in right knee incision area-for repeat I&D on 11/13 Continue Invanz-end of treatment was initially 12/10-now that patient requires repeat I&D-EOT will likely be extended.   Not a candidate for home IV therapies given history of drug use Ortho following-WBAT as tolerated  PAF with RVR Rate controlled with oral metoprolol-continue midodrine for BP support Not a candidate for anticoagulation-in any events CHADS2-VASC score of 1  Liver cirrhosis-s/p TIPS Relatively well compensated Now having BM with changes lactulose-continue 30 g 3 times a day.  Diuretics on hold due to soft BP (on midodrine for BP support)  Polysubstance abuse Acknowledges ongoing meth/THC use and remote cocaine use-although UDS positive for cocaine Ongoing counseling  Obesity: Estimated body mass index is 36.96 kg/m as calculated from the following:   Height as of this encounter: 6' (1.829 m).   Weight as of this encounter: 123.6 kg.   Code status:   Code Status: Full Code   DVT Prophylaxis: enoxaparin (LOVENOX) injection 40 mg Start: 12/30/22 0900 SCDs Start: 12/28/22 0816   Family Communication: None at bedside   Disposition Plan: Status is: Inpatient Remains inpatient appropriate because: Severity of illness   Planned Discharge Destination:Home   Diet: Diet Order  Diet NPO time specified  Diet effective midnight           Diet regular Room service appropriate? Yes; Fluid consistency: Thin  Diet effective now                     Antimicrobial agents: Anti-infectives (From admission, onward)    Start     Dose/Rate Route  Frequency Ordered Stop   12/31/22 1545  ertapenem (INVANZ) 1 g in sodium chloride 0.9 % 100 mL IVPB        1 g 200 mL/hr over 30 Minutes Intravenous Every 24 hours 12/31/22 1546 02/08/23 1000   12/31/22 1100  ertapenem (INVANZ) 1 g in sodium chloride 0.9 % 100 mL IVPB  Status:  Discontinued        1 g 200 mL/hr over 30 Minutes Intravenous Every 24 hours 12/31/22 0931 12/31/22 1546   12/30/22 1400  cefTRIAXone (ROCEPHIN) 2 g in sodium chloride 0.9 % 100 mL IVPB  Status:  Discontinued        2 g 200 mL/hr over 30 Minutes Intravenous Every 24 hours 12/30/22 1056 12/31/22 0931   12/29/22 1000  tobramycin (NEBCIN) powder  Status:  Discontinued          As needed 12/29/22 1022 12/29/22 1035   12/29/22 1000  vancomycin (VANCOCIN) powder  Status:  Discontinued          As needed 12/29/22 1023 12/29/22 1035   12/28/22 1400  piperacillin-tazobactam (ZOSYN) IVPB 3.375 g  Status:  Discontinued        3.375 g 12.5 mL/hr over 240 Minutes Intravenous Every 8 hours 12/28/22 1202 12/30/22 1056   12/28/22 1300  vancomycin (VANCOCIN) IVPB 1000 mg/200 mL premix  Status:  Discontinued        1,000 mg 200 mL/hr over 60 Minutes Intravenous Every 8 hours 12/28/22 1202 12/31/22 1029   12/28/22 0900  doxycycline (VIBRAMYCIN) 100 mg in dextrose 5 % 250 mL IVPB  Status:  Discontinued        100 mg 125 mL/hr over 120 Minutes Intravenous Every 12 hours 12/28/22 0826 12/28/22 1141   12/28/22 0115  vancomycin (VANCOCIN) IVPB 1000 mg/200 mL premix  Status:  Discontinued        1,000 mg 200 mL/hr over 60 Minutes Intravenous  Once 12/28/22 0107 12/28/22 0108   12/28/22 0115  piperacillin-tazobactam (ZOSYN) IVPB 3.375 g        3.375 g 100 mL/hr over 30 Minutes Intravenous  Once 12/28/22 0107 12/28/22 0142   12/28/22 0115  vancomycin (VANCOREADY) IVPB 2000 mg/400 mL        2,000 mg 200 mL/hr over 120 Minutes Intravenous  Once 12/28/22 0108 12/28/22 0342   12/28/22 0100  doxycycline (VIBRA-TABS) tablet 100 mg        100  mg Oral  Once 12/28/22 0049 12/28/22 0054        MEDICATIONS: Scheduled Meds:  cholecalciferol  1,000 Units Oral Daily   enoxaparin (LOVENOX) injection  40 mg Subcutaneous Q24H   folic acid  1 mg Oral Daily   lactulose  30 g Oral TID   metoprolol tartrate  50 mg Oral BID   midodrine  10 mg Oral TID WC   polyethylene glycol  17 g Oral Daily   sodium chloride flush  3 mL Intravenous Q12H   thiamine  100 mg Oral Daily   Vitamin D (Ergocalciferol)  50,000 Units Oral Q7 days   Continuous Infusions:  ertapenem 1  g (01/10/23 1054)   PRN Meds:.acetaminophen, bisacodyl, diltiazem, HYDROmorphone (DILAUDID) injection, lactulose, methocarbamol, oxyCODONE **OR** oxyCODONE, polyethylene glycol, sodium chloride flush   I have personally reviewed following labs and imaging studies  LABORATORY DATA: CBC: Recent Labs  Lab 01/04/23 0430 01/06/23 0451 01/09/23 0638  WBC 4.1 5.0 5.6  HGB 11.0* 10.5* 11.1*  HCT 32.3* 31.3* 33.8*  MCV 89.7 89.2 91.1  PLT 226 236 254    Basic Metabolic Panel: Recent Labs  Lab 01/04/23 0430 01/06/23 0451 01/09/23 0638  NA 132* 134* 134*  K 3.9 4.2 4.9  CL 99 102 100  CO2 28 27 28   GLUCOSE 91 94 89  BUN 12 13 10   CREATININE 0.81 0.69 0.68  CALCIUM 8.3* 8.5* 8.9  MG 1.6*  --   --     GFR: Estimated Creatinine Clearance: 140 mL/min (by C-G formula based on SCr of 0.68 mg/dL).  Liver Function Tests: Recent Labs  Lab 01/06/23 0451 01/09/23 0638  AST 24 21  ALT 10 14  ALKPHOS 106 117  BILITOT 1.0 0.8  PROT 6.1* 6.6  ALBUMIN 1.9* 2.0*   No results for input(s): "LIPASE", "AMYLASE" in the last 168 hours. No results for input(s): "AMMONIA" in the last 168 hours.  Coagulation Profile: No results for input(s): "INR", "PROTIME" in the last 168 hours.  Cardiac Enzymes: No results for input(s): "CKTOTAL", "CKMB", "CKMBINDEX", "TROPONINI" in the last 168 hours.  BNP (last 3 results) No results for input(s): "PROBNP" in the last 8760  hours.  Lipid Profile: No results for input(s): "CHOL", "HDL", "LDLCALC", "TRIG", "CHOLHDL", "LDLDIRECT" in the last 72 hours.  Thyroid Function Tests: No results for input(s): "TSH", "T4TOTAL", "FREET4", "T3FREE", "THYROIDAB" in the last 72 hours.  Anemia Panel: No results for input(s): "VITAMINB12", "FOLATE", "FERRITIN", "TIBC", "IRON", "RETICCTPCT" in the last 72 hours.  Urine analysis:    Component Value Date/Time   COLORURINE AMBER (A) 10/01/2022 0142   APPEARANCEUR CLOUDY (A) 10/01/2022 0142   LABSPEC 1.025 10/01/2022 0142   PHURINE 5.0 10/01/2022 0142   GLUCOSEU NEGATIVE 10/01/2022 0142   HGBUR SMALL (A) 10/01/2022 0142   BILIRUBINUR NEGATIVE 10/01/2022 0142   KETONESUR NEGATIVE 10/01/2022 0142   PROTEINUR 30 (A) 10/01/2022 0142   NITRITE NEGATIVE 10/01/2022 0142   LEUKOCYTESUR LARGE (A) 10/01/2022 0142    Sepsis Labs: Lactic Acid, Venous    Component Value Date/Time   LATICACIDVEN 1.5 12/12/2022 2311    MICROBIOLOGY: No results found for this or any previous visit (from the past 240 hour(s)).   RADIOLOGY STUDIES/RESULTS: No results found.   LOS: 13 days   Jeoffrey Massed, MD  [facility]    To contact the attending provider between 7A-7P or the covering provider during after hours 7P-7A, please log into the web site [email or web address] and access using universal [place] password for that web site. If you do not have the password, please call the hospital operator.  01/10/2023, 11:03 AM

## 2023-01-11 DIAGNOSIS — I4891 Unspecified atrial fibrillation: Secondary | ICD-10-CM | POA: Diagnosis not present

## 2023-01-11 DIAGNOSIS — M009 Pyogenic arthritis, unspecified: Secondary | ICD-10-CM | POA: Diagnosis not present

## 2023-01-11 DIAGNOSIS — I10 Essential (primary) hypertension: Secondary | ICD-10-CM | POA: Diagnosis not present

## 2023-01-11 DIAGNOSIS — I480 Paroxysmal atrial fibrillation: Secondary | ICD-10-CM | POA: Diagnosis not present

## 2023-01-11 MED ORDER — SODIUM CHLORIDE 0.9% FLUSH
10.0000 mL | INTRAVENOUS | Status: DC | PRN
  Administered 2023-01-28: 10 mL

## 2023-01-11 MED ORDER — SODIUM CHLORIDE 0.9% FLUSH
10.0000 mL | Freq: Two times a day (BID) | INTRAVENOUS | Status: DC
  Administered 2023-01-11 – 2023-01-19 (×15): 10 mL
  Administered 2023-01-19: 30 mL
  Administered 2023-01-20 – 2023-01-30 (×20): 10 mL
  Administered 2023-01-30: 20 mL
  Administered 2023-01-31 – 2023-02-04 (×7): 10 mL

## 2023-01-11 MED ORDER — CHLORHEXIDINE GLUCONATE CLOTH 2 % EX PADS
6.0000 | MEDICATED_PAD | Freq: Every day | CUTANEOUS | Status: DC
  Administered 2023-01-11 – 2023-02-04 (×25): 6 via TOPICAL

## 2023-01-11 NOTE — Progress Notes (Signed)
Physical Therapy Treatment Patient Details Name: Jonathan Ibarra MRN: [id number] DOB: 01-15-1966 Today's Date: 01/11/2023   History of Present Illness Pt is a 57 y/o male presenting 10/28 to [facility] with severe right knee pain and drainage after a fall the night PTA, he is now s/p R knee I&D on 12/29/22. He recently had a R tibial ORIF on 12/13/22 after a falls incidence. ION:GEXBMWU abuse, [name], HTN, liver cirrhosis, polysubstance abuse.    PT Comments  Pt with fair tolerance to treatment today. Pt initially agitated upon arrival due to frustration with care received from nursing staff and healing process. Therapeutic listening provided and pt initially calmed down however remained rather impulsive during session requiring constant cues for safety. Pt was able to ambulate to nurses station today with RW CGA and chair follow for safety. Pt mostly able to follow WB precautions today however was noted to TDWB when fatigued. No change in DC/DME recs at this time. PT will continue to follow.    If plan is discharge home, [name];[name];Assistance with cooking/housework;Assist for transportation;Help with stairs or ramp for entrance   Can travel by private vehicle        Equipment Recommendations  None recommended by PT    Recommendations for Other Services       Precautions / Restrictions Precautions Precautions: Fall Restrictions Weight Bearing Restrictions: Yes RLE Weight Bearing: Non weight bearing Other Position/Activity Restrictions: Still NWB per ortho notes     Mobility  Bed Mobility Overal bed mobility: Needs Assistance Bed Mobility: Supine to Sit, Sit to Supine     Supine to sit: [name], Supervision Sit to supine: Supervision   General bed mobility comments: pt was able to use LLE to assist with RLE mobility to EOB due to pain and limited AROM    Transfers Overall  transfer level: Needs assistance Equipment used: Rolling walker (2 wheels) Transfers: Sit to/from Stand, Bed to chair/wheelchair/BSC Sit to Stand: Contact guard assist, From elevated surface Stand pivot transfers: Contact guard assist         General transfer comment: Pt requesting elevated EOB. Cues for hand placement and technique due to pt attempting to lean over RLE to stand    Ambulation/Gait Ambulation/Gait assistance: Contact guard assist, +2 safety/equipment Gait Distance (Feet): 150 Feet Assistive device: Rolling walker (2 wheels) Gait Pattern/deviations: Trunk flexed (hop to) Gait velocity: decreased     General Gait Details: Frequent standing rest breaks due to fatigue, however pt insisting on increasing distance. Pt able to maintain RLE NWB during ambulation, however sets foot on floor during standing rest breaks. Pt noted to TDWB while stepping back to EOB despite cues. Pt reaching out to rail and sink for increased support at end of trial. (Not much change from previous however chair follow was provided for safety. Pt with one seated rest break due to dressing falling off knee.)   Stairs             Wheelchair Mobility     Tilt Bed    Modified Rankin (Stroke Patients Only)       Balance Overall balance assessment: Needs assistance Sitting-balance support: No upper extremity supported, Feet supported Sitting balance-Leahy Scale: Good Sitting balance - Comments: sitting EOB   Standing balance support: Bilateral upper extremity supported Standing balance-Leahy Scale: Fair Standing balance comment: reliant on RW support  Cognition Arousal: Alert Behavior During Therapy: Impulsive, Agitated, WFL for tasks assessed/performed Overall Cognitive Status: Within Functional Limits for tasks assessed                                 General Comments: Pt initially agitated upon arrival due to frustration with  level of care received from nursing staff and healing process. Therapeutic listening provided and pt initially calmed down however remained rather impulsive during session requiring constant cues for safety.        Exercises      General Comments General comments (skin integrity, edema, etc.): VSS      Pertinent Vitals/Pain Pain Assessment Pain Assessment: Faces Faces Pain Scale: Hurts even more Pain Location: R knee Pain Descriptors / Indicators: Discomfort, Aching, Moaning Pain Intervention(s): Monitored during session, Patient requesting pain meds-RN notified, Repositioned    Home Living                          Prior Function            PT Goals (current goals can now be found in the care plan section) Progress towards PT goals: Progressing toward goals    Frequency    Min 1X/week      PT Plan      Co-evaluation              AM-PAC PT "6 Clicks" Mobility   Outcome Measure  Help needed turning from your back to your side while in a flat bed without using bedrails?: None Help needed moving from lying on your back to sitting on the side of a flat bed without using bedrails?: A Little Help needed moving to and from a bed to a chair (including a wheelchair)?: A Little Help needed standing up from a chair using your arms (e.g., wheelchair or bedside chair)?: A Little Help needed to walk in hospital room?: A Little Help needed climbing 3-5 steps with a railing? : Total 6 Click Score: 17    End of Session Equipment Utilized During Treatment: Gait belt Activity Tolerance: Patient tolerated treatment well;Patient limited by pain Patient left: in bed;with call bell/phone within reach;with nursing/sitter in room Nurse Communication: Mobility status;Patient requests pain meds PT Visit Diagnosis: Muscle weakness (generalized) (M62.81);Other abnormalities of gait and mobility (R26.89)     Time: 1610-9604 PT Time Calculation (min) (ACUTE ONLY): 32  min  Charges:    $Gait Training: 8-22 mins $Therapeutic Activity: 8-22 mins PT General Charges $$ ACUTE PT VISIT: 1 Visit                     Shela Nevin, PT, DPT Acute Rehab Services [phone]    Gladys Damme 01/11/2023, 12:21 PM

## 2023-01-11 NOTE — Plan of Care (Signed)
  Problem: Education: Goal: Knowledge of disease or condition will improve Outcome: Progressing Goal: Understanding of medication regimen will improve Outcome: Progressing Goal: Individualized Educational Video(s) Outcome: Progressing   Problem: Activity: Goal: Ability to tolerate increased activity will improve Outcome: Progressing   Problem: Cardiac: Goal: Ability to achieve and maintain adequate cardiopulmonary perfusion will improve Outcome: Progressing   Problem: Health Behavior/Discharge Planning: Goal: Ability to safely manage health-related needs after discharge will improve Outcome: Progressing   Problem: Education: Goal: Knowledge of General Education information will improve Description: Including pain rating scale, medication(s)/side effects and non-pharmacologic comfort measures Outcome: Progressing   Problem: Health Behavior/Discharge Planning: Goal: Ability to manage health-related needs will improve Outcome: Progressing   Problem: Clinical Measurements: Goal: Ability to maintain clinical measurements within normal limits will improve Outcome: Progressing Goal: Will remain free from infection Outcome: Progressing Goal: Diagnostic test results will improve Outcome: Progressing Goal: Respiratory complications will improve Outcome: Progressing Goal: Cardiovascular complication will be avoided Outcome: Progressing   Problem: Activity: Goal: Risk for activity intolerance will decrease Outcome: Progressing   Problem: Nutrition: Goal: Adequate nutrition will be maintained Outcome: Progressing   Problem: Coping: Goal: Level of anxiety will decrease Outcome: Progressing   Problem: Elimination: Goal: Will not experience complications related to bowel motility Outcome: Progressing Goal: Will not experience complications related to urinary retention Outcome: Progressing   Problem: Pain Management: Goal: General experience of comfort will  improve Outcome: Progressing   Problem: Safety: Goal: Ability to remain free from injury will improve Outcome: Progressing   Problem: Skin Integrity: Goal: Risk for impaired skin integrity will decrease Outcome: Progressing

## 2023-01-11 NOTE — Plan of Care (Signed)
  Problem: Clinical Measurements: Goal: Ability to maintain clinical measurements within normal limits will improve Outcome: Progressing   Problem: Nutrition: Goal: Adequate nutrition will be maintained Outcome: Progressing   Problem: Pain Management: Goal: General experience of comfort will improve Outcome: Progressing

## 2023-01-11 NOTE — Progress Notes (Signed)
Peripherally Inserted Central Catheter Placement  The IV Nurse has discussed with the patient and/or persons authorized to consent for the patient, the purpose of this procedure and the potential benefits and risks involved with this procedure.  The benefits include less needle sticks, lab draws from the catheter, and the patient may be discharged home with the catheter. Risks include, but not limited to, infection, bleeding, blood clot (thrombus formation), and puncture of an artery; nerve damage and irregular heartbeat and possibility to perform a PICC exchange if needed/ordered by physician.  Alternatives to this procedure were also discussed.  Bard Power PICC patient education guide, fact sheet on infection prevention and patient information card has been provided to patient /or left at bedside.    PICC Placement Documentation  PICC Single Lumen 01/11/23 Right Brachial 41 cm 1 cm (Active)  Indication for Insertion or Continuance of Line Prolonged intravenous therapies 01/11/23 1307  Exposed Catheter (cm) 1 cm 01/11/23 1307  Site Assessment Clean, Dry, Intact 01/11/23 1307  Line Status Flushed;Saline locked;Blood return noted 01/11/23 1307  Dressing Type Transparent;Securing device 01/11/23 1307  Dressing Status Antimicrobial disc in place 01/11/23 1307  Line Care Connections checked and tightened 01/11/23 1307  Line Adjustment (NICU/IV Team Only) No 01/11/23 1307  Dressing Intervention New dressing;Adhesive placed at insertion site (IV team only);Other (Comment) 01/11/23 1307       Annett Fabian 01/11/2023, 1:08 PM

## 2023-01-11 NOTE — Progress Notes (Signed)
PROGRESS NOTE        PATIENT DETAILS Name: Jonathan Ibarra Age: 57 y.o. Sex: male Date of Birth: [birth year] Admit Date: 12/27/2022 Admitting Physician Osvaldo Shipper, MD [id number], [name], MD  Brief Summary: Patient is a 57 y.o.  male with history of liver cirrhosis, HTN, polysubstance abuse-recent hospitalization from 10/13-10/18 following trauma with fall resulting in a right tibia plateau fracture requiring ORIF on 10/14-presented with purulent discharge from his right knee-he was thought to have septic arthritis and subsequently admitted to the hospitalist service.  Significant events: 10/13-10/18 >> hospitalization-right tibial plateau fracture following a fall-s/p ORIF.   10/28>> purulent discharge from right knee-septic arthritis-admit to [facility]  Significant studies: 08/02>> echo: EF 55-60% 10/28>> x-ray right knee: Unchanged lateral plate/screw fixation across comminuted fracture involving medial/lateral tibial plateaus, moderate knee joint effusion 10/29>> CXR: No active disease  Significant microbiology data: 10/29>> blood culture: 1/2-Corynebacterium (contamination) 10/30>> right knee abscess culture: Klebsiella pneumoniae-ESBL 10/31>> blood culture: No growth  Procedures: 10/30>> irrigation and debridement of right knee  Consults: Ortho recs Infectious disease  Subjective: No cute issues overnight-lying comfortably in bed.  1-2 BMs on a daily basis. Objective: Vitals: Blood pressure 108/81, pulse 97, temperature 98.4 F (36.9 C), temperature source Oral, resp. rate 18, height 6' (1.829 m), weight 123.6 kg, SpO2 98%.   Exam: Awake/ Nonfocal exam.  Pertinent Labs/Radiology:    Latest Ref Rng & Units 01/09/2023    6:38 AM 01/06/2023    4:51 AM 01/04/2023    4:30 AM  CBC  WBC 4.0 - 10.5 K/uL 5.6  5.0  4.1   Hemoglobin 13.0 - 17.0 g/dL 44.0  10.2  72.5   Hematocrit 39.0 - 52.0 % 33.8  31.3  32.3   Platelets 150 - 400 K/uL 254   236  226     Lab Results  Component Value Date   NA 134 (L) 01/09/2023   K 4.9 01/09/2023   CL 100 01/09/2023   CO2 28 01/09/2023      Assessment/Plan: Septic arthritis right knee (ESBL Klebsiella pneumoniae)-with recent history of ORIF on 10/14 with hardware in place S/p I&D 10/30-VAC discontinued 11/4 Evaluated by orthopedics on 11/11-purulence observed in right knee incision area-for repeat I&D on 11/13 Continue Invanz-end of treatment was initially 12/10-now that patient requires repeat I&D-EOT will likely be extended.   Not a candidate for home IV therapies given history of drug use Ortho following-WBAT as tolerated  PAF with RVR Rate controlled with oral metoprolol-continue midodrine for BP support Not a candidate for anticoagulation-in any events CHADS2-VASC score of 1  Liver cirrhosis-s/p TIPS Relatively well compensated Volume status relatively stable Continue lactulose. Diuretics on hold due to soft BP (on midodrine for BP support)  Polysubstance abuse Acknowledges ongoing meth/THC use and remote cocaine use-although UDS positive for cocaine Ongoing counseling  Obesity: Estimated body mass index is 36.96 kg/m as calculated from the following:   Height as of this encounter: 6' (1.829 m).   Weight as of this encounter: 123.6 kg.   Code status:   Code Status: Full Code   DVT Prophylaxis: enoxaparin (LOVENOX) injection 40 mg Start: 12/30/22 0900 SCDs Start: 12/28/22 0816   Family Communication: None at bedside   Disposition Plan: Status is: Inpatient Remains inpatient appropriate because: Severity of illness   Planned Discharge Destination:Home   Diet: Diet Order  Diet NPO time specified  Diet effective midnight           Diet regular Room service appropriate? Yes; Fluid consistency: Thin  Diet effective now                     Antimicrobial agents: Anti-infectives (From admission, onward)    Start     Dose/Rate Route  Frequency Ordered Stop   12/31/22 1545  ertapenem (INVANZ) 1 g in sodium chloride 0.9 % 100 mL IVPB        1 g 200 mL/hr over 30 Minutes Intravenous Every 24 hours 12/31/22 1546 02/08/23 1000   12/31/22 1100  ertapenem (INVANZ) 1 g in sodium chloride 0.9 % 100 mL IVPB  Status:  Discontinued        1 g 200 mL/hr over 30 Minutes Intravenous Every 24 hours 12/31/22 0931 12/31/22 1546   12/30/22 1400  cefTRIAXone (ROCEPHIN) 2 g in sodium chloride 0.9 % 100 mL IVPB  Status:  Discontinued        2 g 200 mL/hr over 30 Minutes Intravenous Every 24 hours 12/30/22 1056 12/31/22 0931   12/29/22 1000  tobramycin (NEBCIN) powder  Status:  Discontinued          As needed 12/29/22 1022 12/29/22 1035   12/29/22 1000  vancomycin (VANCOCIN) powder  Status:  Discontinued          As needed 12/29/22 1023 12/29/22 1035   12/28/22 1400  piperacillin-tazobactam (ZOSYN) IVPB 3.375 g  Status:  Discontinued        3.375 g 12.5 mL/hr over 240 Minutes Intravenous Every 8 hours 12/28/22 1202 12/30/22 1056   12/28/22 1300  vancomycin (VANCOCIN) IVPB 1000 mg/200 mL premix  Status:  Discontinued        1,000 mg 200 mL/hr over 60 Minutes Intravenous Every 8 hours 12/28/22 1202 12/31/22 1029   12/28/22 0900  doxycycline (VIBRAMYCIN) 100 mg in dextrose 5 % 250 mL IVPB  Status:  Discontinued        100 mg 125 mL/hr over 120 Minutes Intravenous Every 12 hours 12/28/22 0826 12/28/22 1141   12/28/22 0115  vancomycin (VANCOCIN) IVPB 1000 mg/200 mL premix  Status:  Discontinued        1,000 mg 200 mL/hr over 60 Minutes Intravenous  Once 12/28/22 0107 12/28/22 0108   12/28/22 0115  piperacillin-tazobactam (ZOSYN) IVPB 3.375 g        3.375 g 100 mL/hr over 30 Minutes Intravenous  Once 12/28/22 0107 12/28/22 0142   12/28/22 0115  vancomycin (VANCOREADY) IVPB 2000 mg/400 mL        2,000 mg 200 mL/hr over 120 Minutes Intravenous  Once 12/28/22 0108 12/28/22 0342   12/28/22 0100  doxycycline (VIBRA-TABS) tablet 100 mg        100  mg Oral  Once 12/28/22 0049 12/28/22 0054        MEDICATIONS: Scheduled Meds:  cholecalciferol  1,000 Units Oral Daily   enoxaparin (LOVENOX) injection  40 mg Subcutaneous Q24H   folic acid  1 mg Oral Daily   lactulose  30 g Oral TID   metoprolol tartrate  50 mg Oral BID   midodrine  10 mg Oral TID WC   polyethylene glycol  17 g Oral Daily   sodium chloride flush  3 mL Intravenous Q12H   thiamine  100 mg Oral Daily   Vitamin D (Ergocalciferol)  50,000 Units Oral Q7 days   Continuous Infusions:  ertapenem 1  g (01/11/23 1003)   PRN Meds:.acetaminophen, bisacodyl, diltiazem, HYDROmorphone (DILAUDID) injection, lactulose, methocarbamol, mouth rinse, [DISCONTINUED] oxyCODONE **OR** oxyCODONE, polyethylene glycol, sodium chloride flush   I have personally reviewed following labs and imaging studies  LABORATORY DATA: CBC: Recent Labs  Lab 01/06/23 0451 01/09/23 0638  WBC 5.0 5.6  HGB 10.5* 11.1*  HCT 31.3* 33.8*  MCV 89.2 91.1  PLT 236 254    Basic Metabolic Panel: Recent Labs  Lab 01/06/23 0451 01/09/23 0638  NA 134* 134*  K 4.2 4.9  CL 102 100  CO2 27 28  GLUCOSE 94 89  BUN 13 10  CREATININE 0.69 0.68  CALCIUM 8.5* 8.9    GFR: Estimated Creatinine Clearance: 140 mL/min (by C-G formula based on SCr of 0.68 mg/dL).  Liver Function Tests: Recent Labs  Lab 01/06/23 0451 01/09/23 0638  AST 24 21  ALT 10 14  ALKPHOS 106 117  BILITOT 1.0 0.8  PROT 6.1* 6.6  ALBUMIN 1.9* 2.0*   No results for input(s): "LIPASE", "AMYLASE" in the last 168 hours. No results for input(s): "AMMONIA" in the last 168 hours.  Coagulation Profile: No results for input(s): "INR", "PROTIME" in the last 168 hours.  Cardiac Enzymes: No results for input(s): "CKTOTAL", "CKMB", "CKMBINDEX", "TROPONINI" in the last 168 hours.  BNP (last 3 results) No results for input(s): "PROBNP" in the last 8760 hours.  Lipid Profile: No results for input(s): "CHOL", "HDL", "LDLCALC", "TRIG",  "CHOLHDL", "LDLDIRECT" in the last 72 hours.  Thyroid Function Tests: No results for input(s): "TSH", "T4TOTAL", "FREET4", "T3FREE", "THYROIDAB" in the last 72 hours.  Anemia Panel: No results for input(s): "VITAMINB12", "FOLATE", "FERRITIN", "TIBC", "IRON", "RETICCTPCT" in the last 72 hours.  Urine analysis:    Component Value Date/Time   COLORURINE AMBER (A) 10/01/2022 0142   APPEARANCEUR CLOUDY (A) 10/01/2022 0142   LABSPEC 1.025 10/01/2022 0142   PHURINE 5.0 10/01/2022 0142   GLUCOSEU NEGATIVE 10/01/2022 0142   HGBUR SMALL (A) 10/01/2022 0142   BILIRUBINUR NEGATIVE 10/01/2022 0142   KETONESUR NEGATIVE 10/01/2022 0142   PROTEINUR 30 (A) 10/01/2022 0142   NITRITE NEGATIVE 10/01/2022 0142   LEUKOCYTESUR LARGE (A) 10/01/2022 0142    Sepsis Labs: Lactic Acid, Venous    Component Value Date/Time   LATICACIDVEN 1.5 12/12/2022 2311    MICROBIOLOGY: No results found for this or any previous visit (from the past 240 hour(s)).   RADIOLOGY STUDIES/RESULTS: [place] EKG SITE RITE  Result Date: 01/10/2023 If Site Rite image not attached, placement could not be confirmed due to current cardiac rhythm.    LOS: 14 days   Jeoffrey Massed, MD  [facility]    To contact the attending provider between 7A-7P or the covering provider during after hours 7P-7A, please log into the web site [email or web address] and access using universal Bruce password for that web site. If you do not have the password, please call the hospital operator.  01/11/2023, 10:27 AM

## 2023-01-11 NOTE — Anesthesia Preprocedure Evaluation (Signed)
Anesthesia Evaluation  Patient identified by MRN, date of birth, ID band Patient awake    Reviewed: Allergy & Precautions, NPO status , Patient's Chart, lab work & pertinent test results, reviewed documented beta blocker date and time   History of Anesthesia Complications Negative for: history of anesthetic complications  Airway Mallampati: III  TM Distance: >3 FB Neck ROM: Full    Dental no notable dental hx. (+) Poor Dentition, Chipped, Missing   Pulmonary neg COPD, Current Smoker and Patient abstained from smoking.   Pulmonary exam normal breath sounds clear to auscultation       Cardiovascular hypertension, + CAD and + Past MI  (-) Cardiac Stents, (-) CABG and (-) CHF Normal cardiovascular exam+ dysrhythmias Atrial Fibrillation  Rhythm:Irregular Rate:Normal  Last TTE ok   Neuro/Psych Seizures -,     GI/Hepatic ,GERD  ,,(+) Cirrhosis     substance abuse  , Hepatitis -, CINR 1.2   Endo/Other    Renal/GU Lab Results      Component                Value               Date                    K                        4.9                 01/09/2023                   CREATININE               0.68                01/09/2023                Musculoskeletal  (+) Arthritis ,    Abdominal   Peds  Hematology Lab Results      Component                Value               Date                      WBC                      5.6                 01/09/2023                HGB                      11.1 (L)            01/09/2023                HCT                      33.8 (L)            01/09/2023                MCV                      91.1  01/09/2023                PLT                      254                 01/09/2023              Anesthesia Other Findings   Reproductive/Obstetrics                             Anesthesia Physical Anesthesia Plan  ASA: 4  Anesthesia Plan: General    Post-op Pain Management:    Induction: Intravenous  PONV Risk Score and Plan: 1 and Ondansetron and Treatment may vary due to age or medical condition  Airway Management Planned: Oral ETT  Additional Equipment: None  Intra-op Plan:   Post-operative Plan: Extubation in OR  Informed Consent: I have reviewed the patients History and Physical, chart, labs and discussed the procedure including the risks, benefits and alternatives for the proposed anesthesia with the patient or authorized representative who has indicated his/her understanding and acceptance.     Dental advisory given  Plan Discussed with: CRNA  Anesthesia Plan Comments:         Anesthesia Quick Evaluation

## 2023-01-12 ENCOUNTER — Other Ambulatory Visit: Payer: Self-pay

## 2023-01-12 ENCOUNTER — Inpatient Hospital Stay (HOSPITAL_COMMUNITY): Payer: Commercial Managed Care - HMO | Admitting: Anesthesiology

## 2023-01-12 ENCOUNTER — Inpatient Hospital Stay (HOSPITAL_COMMUNITY): Payer: Commercial Managed Care - HMO

## 2023-01-12 ENCOUNTER — Encounter (HOSPITAL_COMMUNITY): Payer: Self-pay | Admitting: Internal Medicine

## 2023-01-12 ENCOUNTER — Encounter (HOSPITAL_COMMUNITY)
Admission: EM | Disposition: A | Discharge status: Home Health | Payer: Self-pay | Source: Home / Self Care | Attending: Internal Medicine

## 2023-01-12 DIAGNOSIS — I2510 Atherosclerotic heart disease of native coronary artery without angina pectoris: Secondary | ICD-10-CM

## 2023-01-12 DIAGNOSIS — M009 Pyogenic arthritis, unspecified: Secondary | ICD-10-CM | POA: Diagnosis not present

## 2023-01-12 DIAGNOSIS — I10 Essential (primary) hypertension: Secondary | ICD-10-CM | POA: Diagnosis not present

## 2023-01-12 DIAGNOSIS — I480 Paroxysmal atrial fibrillation: Secondary | ICD-10-CM | POA: Diagnosis not present

## 2023-01-12 DIAGNOSIS — T8142XA Infection following a procedure, deep incisional surgical site, initial encounter: Secondary | ICD-10-CM | POA: Diagnosis not present

## 2023-01-12 DIAGNOSIS — I4891 Unspecified atrial fibrillation: Secondary | ICD-10-CM | POA: Diagnosis not present

## 2023-01-12 DIAGNOSIS — F17210 Nicotine dependence, cigarettes, uncomplicated: Secondary | ICD-10-CM

## 2023-01-12 HISTORY — PX: IRRIGATION AND DEBRIDEMENT KNEE: SHX5185

## 2023-01-12 LAB — CBC
HCT: 31.9 % — ABNORMAL LOW (ref 39.0–52.0)
Hemoglobin: 10.7 g/dL — ABNORMAL LOW (ref 13.0–17.0)
MCH: 30 pg (ref 26.0–34.0)
MCHC: 33.5 g/dL (ref 30.0–36.0)
MCV: 89.4 fL (ref 80.0–100.0)
Platelets: 225 10*3/uL (ref 150–400)
RBC: 3.57 MIL/uL — ABNORMAL LOW (ref 4.22–5.81)
RDW: 14.2 % (ref 11.5–15.5)
WBC: 4.7 10*3/uL (ref 4.0–10.5)
nRBC: 0 % (ref 0.0–0.2)

## 2023-01-12 LAB — COMPREHENSIVE METABOLIC PANEL
ALT: 14 U/L (ref 0–44)
AST: 21 U/L (ref 15–41)
Albumin: 2.1 g/dL — ABNORMAL LOW (ref 3.5–5.0)
Alkaline Phosphatase: 103 U/L (ref 38–126)
Anion gap: 4 — ABNORMAL LOW (ref 5–15)
BUN: 9 mg/dL (ref 6–20)
CO2: 26 mmol/L (ref 22–32)
Calcium: 8.7 mg/dL — ABNORMAL LOW (ref 8.9–10.3)
Chloride: 102 mmol/L (ref 98–111)
Creatinine, Ser: 0.63 mg/dL (ref 0.61–1.24)
GFR, Estimated: >60 mL/min (ref >60)
Glucose, Bld: 94 mg/dL (ref 70–99)
Potassium: 3.9 mmol/L (ref 3.5–5.1)
Sodium: 132 mmol/L — ABNORMAL LOW (ref 135–145)
Total Bilirubin: 1.3 mg/dL — ABNORMAL HIGH (ref <1.2)
Total Protein: 6.5 g/dL (ref 6.5–8.1)

## 2023-01-12 LAB — MISC LABCORP TEST (SEND OUT): Labcorp test code: 96388

## 2023-01-12 SURGERY — IRRIGATION AND DEBRIDEMENT KNEE
Anesthesia: General | Site: Knee | Laterality: Right

## 2023-01-12 MED ORDER — METOPROLOL TARTRATE 5 MG/5ML IV SOLN: 5.0000 mg | INTRAVENOUS | Status: DC | PRN

## 2023-01-12 MED ORDER — 0.9 % SODIUM CHLORIDE (POUR BTL) OPTIME
TOPICAL | Status: DC | PRN
  Administered 2023-01-12: 1000 mL

## 2023-01-12 MED ORDER — DEXAMETHASONE SODIUM PHOSPHATE 10 MG/ML IJ SOLN
INTRAMUSCULAR | Status: DC | PRN
  Administered 2023-01-12: 10 mg via INTRAVENOUS

## 2023-01-12 MED ORDER — FENTANYL CITRATE (PF) 100 MCG/2ML IJ SOLN: 50.0000 ug | Freq: Once | INTRAMUSCULAR | Status: AC

## 2023-01-12 MED ORDER — SUGAMMADEX SODIUM 200 MG/2ML IV SOLN
INTRAVENOUS | Status: DC | PRN
  Administered 2023-01-12: 200 mg via INTRAVENOUS

## 2023-01-12 MED ORDER — TOBRAMYCIN SULFATE 1.2 G IJ SOLR
INTRAMUSCULAR | Status: AC
  Filled 2023-01-12: qty 1.2

## 2023-01-12 MED ORDER — OXYCODONE HCL 5 MG PO TABS
5.0000 mg | ORAL_TABLET | Freq: Once | ORAL | Status: AC | PRN
  Administered 2023-01-12: 5 mg via ORAL

## 2023-01-12 MED ORDER — OXYCODONE HCL 5 MG PO TABS
ORAL_TABLET | ORAL | Status: AC
  Filled 2023-01-12: qty 1

## 2023-01-12 MED ORDER — MIDAZOLAM HCL 2 MG/2ML IJ SOLN
INTRAMUSCULAR | Status: AC
  Filled 2023-01-12: qty 2

## 2023-01-12 MED ORDER — HYDROMORPHONE HCL 1 MG/ML IJ SOLN
INTRAMUSCULAR | Status: AC
  Filled 2023-01-12: qty 1

## 2023-01-12 MED ORDER — TOBRAMYCIN SULFATE 1.2 G IJ SOLR
INTRAMUSCULAR | Status: DC | PRN
  Administered 2023-01-12: 1.2 g

## 2023-01-12 MED ORDER — ESMOLOL HCL 100 MG/10ML IV SOLN
INTRAVENOUS | Status: DC | PRN
  Administered 2023-01-12 (×2): 20 mg via INTRAVENOUS

## 2023-01-12 MED ORDER — ONDANSETRON HCL 4 MG/2ML IJ SOLN
INTRAMUSCULAR | Status: DC | PRN
  Administered 2023-01-12: 4 mg via INTRAVENOUS

## 2023-01-12 MED ORDER — KETOROLAC TROMETHAMINE 30 MG/ML IJ SOLN: 30.0000 mg | Freq: Once | INTRAMUSCULAR | Status: AC | PRN

## 2023-01-12 MED ORDER — LIDOCAINE 2% (20 MG/ML) 5 ML SYRINGE
INTRAMUSCULAR | Status: DC | PRN
  Administered 2023-01-12: 100 mg via INTRAVENOUS

## 2023-01-12 MED ORDER — KETAMINE HCL 50 MG/5ML IJ SOSY
PREFILLED_SYRINGE | INTRAMUSCULAR | Status: AC
Start: 2023-01-12 — End: ?
  Filled 2023-01-12: qty 5

## 2023-01-12 MED ORDER — KETAMINE HCL 10 MG/ML IJ SOLN
INTRAMUSCULAR | Status: DC | PRN
  Administered 2023-01-12: 30 mg via INTRAVENOUS

## 2023-01-12 MED ORDER — PROPOFOL 10 MG/ML IV BOLUS
INTRAVENOUS | Status: DC | PRN
  Administered 2023-01-12: 200 mg via INTRAVENOUS

## 2023-01-12 MED ORDER — ONDANSETRON HCL 4 MG/2ML IJ SOLN: 4.0000 mg | Freq: Once | INTRAMUSCULAR | Status: DC | PRN

## 2023-01-12 MED ORDER — VANCOMYCIN HCL 1000 MG IV SOLR
INTRAVENOUS | Status: AC
  Filled 2023-01-12: qty 20

## 2023-01-12 MED ORDER — METOPROLOL TARTRATE 5 MG/5ML IV SOLN
INTRAVENOUS | Status: AC
  Administered 2023-01-12: 5 mg via INTRAVENOUS
  Filled 2023-01-12: qty 5

## 2023-01-12 MED ORDER — ORAL CARE MOUTH RINSE: 15.0000 mL | Freq: Once | OROMUCOSAL | Status: AC

## 2023-01-12 MED ORDER — FENTANYL CITRATE (PF) 100 MCG/2ML IJ SOLN
INTRAMUSCULAR | Status: AC
  Administered 2023-01-12: 50 ug via INTRAVENOUS
  Filled 2023-01-12: qty 2

## 2023-01-12 MED ORDER — CHLORHEXIDINE GLUCONATE 0.12 % MT SOLN
15.0000 mL | Freq: Once | OROMUCOSAL | Status: AC
  Administered 2023-01-12: 15 mL via OROMUCOSAL

## 2023-01-12 MED ORDER — OXYCODONE HCL 5 MG/5ML PO SOLN: 5.0000 mg | Freq: Once | ORAL | Status: AC | PRN

## 2023-01-12 MED ORDER — HYDROMORPHONE HCL 1 MG/ML IJ SOLN
0.2500 mg | INTRAMUSCULAR | Status: DC | PRN
  Administered 2023-01-12 (×4): 0.5 mg via INTRAVENOUS

## 2023-01-12 MED ORDER — MIDAZOLAM HCL 2 MG/2ML IJ SOLN
INTRAMUSCULAR | Status: DC | PRN
  Administered 2023-01-12: 2 mg via INTRAVENOUS

## 2023-01-12 MED ORDER — FENTANYL CITRATE (PF) 250 MCG/5ML IJ SOLN
INTRAMUSCULAR | Status: AC
  Filled 2023-01-12: qty 5

## 2023-01-12 MED ORDER — PROPOFOL 10 MG/ML IV BOLUS
INTRAVENOUS | Status: AC
  Filled 2023-01-12: qty 20

## 2023-01-12 MED ORDER — FENTANYL CITRATE (PF) 250 MCG/5ML IJ SOLN
INTRAMUSCULAR | Status: DC | PRN
  Administered 2023-01-12: 100 ug via INTRAVENOUS

## 2023-01-12 MED ORDER — PHENYLEPHRINE HCL-NACL 20-0.9 MG/250ML-% IV SOLN
INTRAVENOUS | Status: DC | PRN
  Administered 2023-01-12: 50 ug/min via INTRAVENOUS

## 2023-01-12 MED ORDER — KETOROLAC TROMETHAMINE 30 MG/ML IJ SOLN
INTRAMUSCULAR | Status: AC
  Administered 2023-01-12: 30 mg via INTRAVENOUS
  Filled 2023-01-12: qty 1

## 2023-01-12 MED ORDER — ROCURONIUM BROMIDE 10 MG/ML (PF) SYRINGE
PREFILLED_SYRINGE | INTRAVENOUS | Status: DC | PRN
  Administered 2023-01-12: 40 mg via INTRAVENOUS

## 2023-01-12 MED ORDER — PHENYLEPHRINE 80 MCG/ML (10ML) SYRINGE FOR IV PUSH (FOR BLOOD PRESSURE SUPPORT)
PREFILLED_SYRINGE | INTRAVENOUS | Status: DC | PRN
  Administered 2023-01-12: 120 ug via INTRAVENOUS

## 2023-01-12 MED ORDER — SODIUM CHLORIDE 0.9 % IR SOLN
Status: DC | PRN
  Administered 2023-01-12: 3000 mL

## 2023-01-12 MED ORDER — LACTATED RINGERS IV SOLN: INTRAVENOUS | Status: DC

## 2023-01-12 SURGICAL SUPPLY — 49 items
BAG COUNTER SPONGE SURGICOUNT (BAG) ×1 IMPLANT
BNDG COHESIVE 4X5 TAN STRL (GAUZE/BANDAGES/DRESSINGS) ×1 IMPLANT
BNDG ELASTIC 4INX 5YD STR LF (GAUZE/BANDAGES/DRESSINGS) IMPLANT
BNDG GAUZE DERMACEA FLUFF 4 (GAUZE/BANDAGES/DRESSINGS) ×2 IMPLANT
BRUSH SCRUB EZ PLAIN DRY (MISCELLANEOUS) ×2 IMPLANT
CHLORAPREP W/TINT 26 (MISCELLANEOUS) ×1 IMPLANT
COVER MAYO STAND STRL (DRAPES) ×1 IMPLANT
COVER SURGICAL LIGHT HANDLE (MISCELLANEOUS) ×2 IMPLANT
DRAPE SURG 17X23 STRL (DRAPES) ×1 IMPLANT
DRAPE SURG ORHT 6 SPLT 77X108 (DRAPES) ×1 IMPLANT
DRAPE U-SHAPE 47X51 STRL (DRAPES) ×1 IMPLANT
DRSG ADAPTIC 3X8 NADH LF (GAUZE/BANDAGES/DRESSINGS) ×1 IMPLANT
DRSG MEPILEX POST OP 4X8 (GAUZE/BANDAGES/DRESSINGS) IMPLANT
ELECT REM PT RETURN 9FT ADLT (ELECTROSURGICAL)
ELECTRODE REM PT RTRN 9FT ADLT (ELECTROSURGICAL) IMPLANT
EVACUATOR 1/8 PVC DRAIN (DRAIN) IMPLANT
GAUZE PAD ABD 8X10 STRL (GAUZE/BANDAGES/DRESSINGS) IMPLANT
GAUZE SPONGE 4X4 12PLY STRL (GAUZE/BANDAGES/DRESSINGS) ×1 IMPLANT
GLOVE BIO SURGEON STRL SZ 6.5 (GLOVE) ×3 IMPLANT
GLOVE BIO SURGEON STRL SZ7.5 (GLOVE) ×4 IMPLANT
GLOVE BIOGEL PI IND STRL 6.5 (GLOVE) ×1 IMPLANT
GLOVE BIOGEL PI IND STRL 7.5 (GLOVE) ×1 IMPLANT
GOWN STRL REUS W/ TWL LRG LVL3 (GOWN DISPOSABLE) ×2 IMPLANT
GOWN STRL REUS W/TWL LRG LVL3 (GOWN DISPOSABLE) ×2
HANDPIECE INTERPULSE COAX TIP (DISPOSABLE)
KIT BASIN OR (CUSTOM PROCEDURE TRAY) ×1 IMPLANT
KIT TURNOVER KIT B (KITS) ×1 IMPLANT
MANIFOLD NEPTUNE II (INSTRUMENTS) ×1 IMPLANT
NS IRRIG 1000ML POUR BTL (IV SOLUTION) ×1 IMPLANT
PACK ORTHO EXTREMITY (CUSTOM PROCEDURE TRAY) ×1 IMPLANT
PAD ARMBOARD 7.5X6 YLW CONV (MISCELLANEOUS) ×2 IMPLANT
PAD CAST 3X4 CTTN HI CHSV (CAST SUPPLIES) IMPLANT
PAD CAST 4YDX4 CTTN HI CHSV (CAST SUPPLIES) IMPLANT
PADDING CAST COTTON 3X4 STRL (CAST SUPPLIES) ×1
PADDING CAST COTTON 4X4 STRL (CAST SUPPLIES) ×1
PADDING CAST COTTON 6X4 STRL (CAST SUPPLIES) ×1 IMPLANT
SET HNDPC FAN SPRY TIP SCT (DISPOSABLE) IMPLANT
SPONGE T-LAP 18X18 ~~LOC~~+RFID (SPONGE) ×1 IMPLANT
SUT ETHILON 2 0 FS 18 (SUTURE) ×2 IMPLANT
SUT ETHILON 3 0 PS 1 (SUTURE) ×2 IMPLANT
SUT MON AB 2-0 CT1 36 (SUTURE) ×1 IMPLANT
SUT PDS AB 0 CT 36 (SUTURE) IMPLANT
SWAB CULTURE ESWAB REG 1ML (MISCELLANEOUS) IMPLANT
TOWEL GREEN STERILE (TOWEL DISPOSABLE) ×2 IMPLANT
TOWEL GREEN STERILE FF (TOWEL DISPOSABLE) ×1 IMPLANT
TUBE CONNECTING 12X1/4 (SUCTIONS) ×1 IMPLANT
UNDERPAD 30X36 HEAVY ABSORB (UNDERPADS AND DIAPERS) ×1 IMPLANT
WATER STERILE IRR 1000ML POUR (IV SOLUTION) ×1 IMPLANT
YANKAUER SUCT BULB TIP NO VENT (SUCTIONS) ×1 IMPLANT

## 2023-01-12 NOTE — Progress Notes (Signed)
PROGRESS NOTE        PATIENT DETAILS Name: Jonathan Ibarra Age: 57 y.o. Sex: male Date of Birth: [birth year] Admit Date: 12/27/2022 Admitting Physician Osvaldo Shipper, MD ZOX:[id number], [name], MD  Brief Summary: Patient is a 57 y.o.  male with history of liver cirrhosis, HTN, polysubstance abuse-recent hospitalization from 10/13-10/18 following trauma with fall resulting in a right tibia plateau fracture requiring ORIF on 10/14-presented with purulent discharge from his right knee-he was thought to have septic arthritis and subsequently admitted to the hospitalist service.  Significant events: 10/13-10/18 >> hospitalization-right tibial plateau fracture following a fall-s/p ORIF.   10/28>> purulent discharge from right knee-septic arthritis-admit to [facility]  Significant studies: 08/02>> echo: EF 55-60% 10/28>> x-ray right knee: Unchanged lateral plate/screw fixation across comminuted fracture involving medial/lateral tibial plateaus, moderate knee joint effusion 10/29>> CXR: No active disease  Significant microbiology data: 10/29>> blood culture: 1/2-Corynebacterium (contamination) 10/30>> right knee abscess culture: Klebsiella pneumoniae-ESBL 10/31>> blood culture: No growth  Procedures: 10/30>> irrigation and debridement of right knee  Consults: Ortho  Infectious disease  Subjective: Complains about being n.p.o.-he is hungry and wants to eat-no other issues-he is aware that he needs to be n.p.o. for repeat I&D of his right knee.  Objective: Vitals: Blood pressure 96/61, pulse 68, temperature 98 F (36.7 C), temperature source Oral, resp. rate 16, height 6' (1.829 m), weight 123.6 kg, SpO2 98%.   Exam: Awake alert Not in any distress Nonfocal exam  Pertinent Labs/Radiology:    Latest Ref Rng & Units 01/12/2023    4:26 AM 01/09/2023    6:38 AM 01/06/2023    4:51 AM  CBC  WBC 4.0 - 10.5 K/uL 4.7  5.6  5.0   Hemoglobin 13.0 - 17.0 g/dL 54.0   98.1  19.1   Hematocrit 39.0 - 52.0 % 31.9  33.8  31.3   Platelets 150 - 400 K/uL 225  254  236     Lab Results  Component Value Date   NA 132 (L) 01/12/2023   K 3.9 01/12/2023   CL 102 01/12/2023   CO2 26 01/12/2023      Assessment/Plan: Septic arthritis right knee (ESBL Klebsiella pneumoniae)-with recent history of ORIF on 10/14 with hardware in place S/p I&D 10/30-VAC discontinued 11/4 Evaluated by orthopedics on 11/11-purulence observed in right knee incision area-for repeat I&D later today. Continue Invanz-end of treatment was initially 12/10-now that patient requires repeat I&D-EOT will likely be extended.   Not a candidate for home IV therapies given history of drug use Ortho following-WBAT as tolerated  PAF with RVR Rate controlled with oral metoprolol-continue midodrine for BP support Not a candidate for anticoagulation-in any events CHADS2-VASC score of 1  Liver cirrhosis-s/p TIPS Relatively well compensated Volume status relatively stable Continue lactulose. Diuretics on hold due to soft BP (on midodrine for BP support)  Polysubstance abuse Acknowledges ongoing meth/THC use and remote cocaine use-although UDS positive for cocaine Ongoing counseling  Obesity: Estimated body mass index is 36.96 kg/m as calculated from the following:   Height as of this encounter: 6' (1.829 m).   Weight as of this encounter: 123.6 kg.   Code status:   Code Status: Full Code   DVT Prophylaxis: enoxaparin (LOVENOX) injection 40 mg Start: 12/30/22 0900 SCDs Start: 12/28/22 0816   Family Communication: None at bedside   Disposition Plan: Status is: Inpatient Remains  inpatient appropriate because: Severity of illness   Planned Discharge Destination:Home   Diet: Diet Order             Diet NPO time specified  Diet effective midnight                     Antimicrobial agents: Anti-infectives (From admission, onward)    Start     Dose/Rate Route Frequency  Ordered Stop   12/31/22 1545  ertapenem (INVANZ) 1 g in sodium chloride 0.9 % 100 mL IVPB        1 g 200 mL/hr over 30 Minutes Intravenous Every 24 hours 12/31/22 1546 02/08/23 1000   12/31/22 1100  ertapenem (INVANZ) 1 g in sodium chloride 0.9 % 100 mL IVPB  Status:  Discontinued        1 g 200 mL/hr over 30 Minutes Intravenous Every 24 hours 12/31/22 0931 12/31/22 1546   12/30/22 1400  cefTRIAXone (ROCEPHIN) 2 g in sodium chloride 0.9 % 100 mL IVPB  Status:  Discontinued        2 g 200 mL/hr over 30 Minutes Intravenous Every 24 hours 12/30/22 1056 12/31/22 0931   12/29/22 1000  tobramycin (NEBCIN) powder  Status:  Discontinued          As needed 12/29/22 1022 12/29/22 1035   12/29/22 1000  vancomycin (VANCOCIN) powder  Status:  Discontinued          As needed 12/29/22 1023 12/29/22 1035   12/28/22 1400  piperacillin-tazobactam (ZOSYN) IVPB 3.375 g  Status:  Discontinued        3.375 g 12.5 mL/hr over 240 Minutes Intravenous Every 8 hours 12/28/22 1202 12/30/22 1056   12/28/22 1300  vancomycin (VANCOCIN) IVPB 1000 mg/200 mL premix  Status:  Discontinued        1,000 mg 200 mL/hr over 60 Minutes Intravenous Every 8 hours 12/28/22 1202 12/31/22 1029   12/28/22 0900  doxycycline (VIBRAMYCIN) 100 mg in dextrose 5 % 250 mL IVPB  Status:  Discontinued        100 mg 125 mL/hr over 120 Minutes Intravenous Every 12 hours 12/28/22 0826 12/28/22 1141   12/28/22 0115  vancomycin (VANCOCIN) IVPB 1000 mg/200 mL premix  Status:  Discontinued        1,000 mg 200 mL/hr over 60 Minutes Intravenous  Once 12/28/22 0107 12/28/22 0108   12/28/22 0115  piperacillin-tazobactam (ZOSYN) IVPB 3.375 g        3.375 g 100 mL/hr over 30 Minutes Intravenous  Once 12/28/22 0107 12/28/22 0142   12/28/22 0115  vancomycin (VANCOREADY) IVPB 2000 mg/400 mL        2,000 mg 200 mL/hr over 120 Minutes Intravenous  Once 12/28/22 0108 12/28/22 0342   12/28/22 0100  doxycycline (VIBRA-TABS) tablet 100 mg        100 mg Oral   Once 12/28/22 0049 12/28/22 0054        MEDICATIONS: Scheduled Meds:  Chlorhexidine Gluconate Cloth  6 each Topical Daily   cholecalciferol  1,000 Units Oral Daily   enoxaparin (LOVENOX) injection  40 mg Subcutaneous Q24H   folic acid  1 mg Oral Daily   lactulose  30 g Oral TID   metoprolol tartrate  50 mg Oral BID   midodrine  10 mg Oral TID WC   polyethylene glycol  17 g Oral Daily   sodium chloride flush  10-40 mL Intracatheter Q12H   sodium chloride flush  3 mL Intravenous Q12H  thiamine  100 mg Oral Daily   Vitamin D (Ergocalciferol)  50,000 Units Oral Q7 days   Continuous Infusions:  ertapenem 1 g (01/11/23 1003)   PRN Meds:.acetaminophen, bisacodyl, diltiazem, HYDROmorphone (DILAUDID) injection, lactulose, methocarbamol, mouth rinse, [DISCONTINUED] oxyCODONE **OR** oxyCODONE, polyethylene glycol, sodium chloride flush, sodium chloride flush   I have personally reviewed following labs and imaging studies  LABORATORY DATA: CBC: Recent Labs  Lab 01/06/23 0451 01/09/23 0638 01/12/23 0426  WBC 5.0 5.6 4.7  HGB 10.5* 11.1* 10.7*  HCT 31.3* 33.8* 31.9*  MCV 89.2 91.1 89.4  PLT 236 254 225    Basic Metabolic Panel: Recent Labs  Lab 01/06/23 0451 01/09/23 0638 01/12/23 0426  NA 134* 134* 132*  K 4.2 4.9 3.9  CL 102 100 102  CO2 27 28 26   GLUCOSE 94 89 94  BUN 13 10 9   CREATININE 0.69 0.68 0.63  CALCIUM 8.5* 8.9 8.7*    GFR: Estimated Creatinine Clearance: 140 mL/min (by C-G formula based on SCr of 0.63 mg/dL).  Liver Function Tests: Recent Labs  Lab 01/06/23 0451 01/09/23 0638 01/12/23 0426  AST 24 21 21   ALT 10 14 14   ALKPHOS 106 117 103  BILITOT 1.0 0.8 1.3*  PROT 6.1* 6.6 6.5  ALBUMIN 1.9* 2.0* 2.1*   No results for input(s): "LIPASE", "AMYLASE" in the last 168 hours. No results for input(s): "AMMONIA" in the last 168 hours.  Coagulation Profile: No results for input(s): "INR", "PROTIME" in the last 168 hours.  Cardiac Enzymes: No  results for input(s): "CKTOTAL", "CKMB", "CKMBINDEX", "TROPONINI" in the last 168 hours.  BNP (last 3 results) No results for input(s): "PROBNP" in the last 8760 hours.  Lipid Profile: No results for input(s): "CHOL", "HDL", "LDLCALC", "TRIG", "CHOLHDL", "LDLDIRECT" in the last 72 hours.  Thyroid Function Tests: No results for input(s): "TSH", "T4TOTAL", "FREET4", "T3FREE", "THYROIDAB" in the last 72 hours.  Anemia Panel: No results for input(s): "VITAMINB12", "FOLATE", "FERRITIN", "TIBC", "IRON", "RETICCTPCT" in the last 72 hours.  Urine analysis:    Component Value Date/Time   COLORURINE AMBER (A) 10/01/2022 0142   APPEARANCEUR CLOUDY (A) 10/01/2022 0142   LABSPEC 1.025 10/01/2022 0142   PHURINE 5.0 10/01/2022 0142   GLUCOSEU NEGATIVE 10/01/2022 0142   HGBUR SMALL (A) 10/01/2022 0142   BILIRUBINUR NEGATIVE 10/01/2022 0142   KETONESUR NEGATIVE 10/01/2022 0142   PROTEINUR 30 (A) 10/01/2022 0142   NITRITE NEGATIVE 10/01/2022 0142   LEUKOCYTESUR LARGE (A) 10/01/2022 0142    Sepsis Labs: Lactic Acid, Venous    Component Value Date/Time   LATICACIDVEN 1.5 12/12/2022 2311    MICROBIOLOGY: No results found for this or any previous visit (from the past 240 hour(s)).   RADIOLOGY STUDIES/RESULTS: [place] EKG SITE RITE  Result Date: 01/10/2023 If Site Rite image not attached, placement could not be confirmed due to current cardiac rhythm.    LOS: 15 days   Jeoffrey Massed, MD  [facility]    To contact the attending provider between 7A-7P or the covering provider during after hours 7P-7A, please log into the web site [email or web address] and access using universal [place] password for that web site. If you do not have the password, please call the hospital operator.  01/12/2023, 8:52 AM

## 2023-01-12 NOTE — Transfer of Care (Signed)
Immediate Anesthesia Transfer of Care Note  Patient: Jonathan Ibarra  Procedure(s) Performed: IRRIGATION AND DEBRIDEMENT KNEE (Right: Knee)  Patient Location: PACU  Anesthesia Type:General  Level of Consciousness: awake  Airway & Oxygen Therapy: Patient Spontanous Breathing  Post-op Assessment: Report given to RN and Post -op Vital signs reviewed and stable. Patient in rapid rate Afib.   Post vital signs: Reviewed and stable  Last Vitals:  Vitals Value Taken Time  BP 124/78 01/12/23 1335  Temp    Pulse 129 01/12/23 1338  Resp 13 01/12/23 1338  SpO2 98 % 01/12/23 1338  Vitals shown include unfiled device data.  Last Pain:  Vitals:   01/12/23 0939  TempSrc: Oral  PainSc:       Patients Stated Pain Goal: 1 (01/12/23 0750)  Complications: No notable events documented.

## 2023-01-12 NOTE — Anesthesia Postprocedure Evaluation (Signed)
Anesthesia Post Note  Patient: [name]  Procedure(s) Performed: IRRIGATION AND DEBRIDEMENT KNEE (Right: Knee)     Patient location during evaluation: PACU Anesthesia Type: General Level of consciousness: awake and alert Pain management: pain level controlled Vital Signs Assessment: post-procedure vital signs reviewed and stable Respiratory status: spontaneous breathing, nonlabored ventilation, respiratory function stable and patient connected to nasal cannula oxygen Cardiovascular status: blood pressure returned to baseline, stable and tachycardic Postop Assessment: no apparent nausea or vomiting Anesthetic complications: no   No notable events documented.  Last Vitals:  Vitals:   01/12/23 1430 01/12/23 1445  BP: 119/83 106/68  Pulse: (!) 113 96  Resp: (!) 34 (!) 21  Temp:    SpO2: 99% 100%    Last Pain:  Vitals:   01/12/23 1445  TempSrc:   PainSc: Asleep                 Jonathan Ibarra

## 2023-01-12 NOTE — Op Note (Signed)
Orthopaedic Surgery Operative Note (CSN: [id number] ) Date of Surgery: 01/12/2023  Admit Date: 12/27/2022   Diagnoses: Pre-Op Diagnoses: Right tibial plateau fracture Right knee infection  Post-Op Diagnosis: Same  Procedures: CPT 10180-Irrigation and debridement of right knee   Surgeons : Primary: Roby Lofts, MD  Assistant: Ulyses Southward, PA-C  Location: OR 3   Anesthesia: General   Antibiotics: Scheduled IV antibiotics, 1.2 gm tobramycin powder placed topically in incision  Tourniquet time: None    Estimated Blood Loss: 10 mL  Complications:* No complications entered in OR log *   Specimens: ID Type Source Tests Collected by Time Destination  A : Right knee #1 Tissue Path Tissue AEROBIC/ANAEROBIC CULTURE W GRAM STAIN (SURGICAL/DEEP WOUND) Roby Lofts, MD 01/12/2023 1313   B : Right knee #2 Tissue Path Tissue AEROBIC/ANAEROBIC CULTURE W Romie Minus STAIN (SURGICAL/DEEP WOUND) Roby Lofts, MD 01/12/2023 1316      Implants: * No implants in log *   Indications for Surgery: Patient sustained a right tibial plateau fracture and underwent open reduction internal fixation in early October.  He developed a postoperative infection requiring irrigation debridement approximately 2 weeks postop.  He has been in the hospital and has persistent drainage with some purulent material from his wound and I felt that he was indicated for repeat irrigation debridement.  Risk and benefits were discussed with the patient.  He agrees to proceed with surgery and consent was obtained.  Operative Findings: Some persistent purulent and fibrinous material around the hardware.  Proximally and distally appeared to be benign.  No signs of any significant signs of osteomyelitis of the proximal tibia.  Procedure: The patient was identified in the preoperative holding area. Consent was confirmed with the patient and their family and all questions were answered. The operative extremity was marked  after confirmation with the patient. he was then brought back to the operating room by our anesthesia colleagues.  He was transferred over to a radiolucent flattop table.  He was placed under general anesthetic.  The right lower extremity was then prepped and draped in usual sterile fashion.  Timeout was performed to verify the patient, the procedure, and the extremity.  Preoperative antibiotics were dosed.  I reopened the distal portion of incision as the proximal portion.  Be healing well.  There is some fibrinous and exudative material that was present around the hardware and proximal tibia laterally.  There is much improved from previous however we took a Cobb elevator and debrided this area and sent it for culture.  We then used low-pressure pulsatile lavage to thoroughly irrigate the wound.  1.2 g of tobramycin powder were placed into the incision.  A layered closure of 2-0 nylon was used to close the incision.  Sterile dressing was applied.  The patient was then awoke from anesthesia and taken to the PACU in stable condition.   Debridement type: Excisional Debridement  Side: right  Body Location: Knee  Tools used for debridement: scalpel and rongeur  Pre-debridement Wound size (cm):   N/a-closed  Post-debridement Wound size (cm):   N/A-closed  Debridement depth beyond dead/damaged tissue down to healthy viable tissue: yes  Tissue layer involved: skin, subcutaneous tissue, muscle / fascia  Nature of tissue removed: Devitalized Tissue and Purulence  Irrigation volume: 3L     Irrigation fluid type: Normal Saline   Post Op Plan/Instructions: Patient will maintain touchdown weightbearing precautions to the right upper extremity.  He will continue to receive IV antibiotics under discretion of  infectious disease.  We will continue to monitor the wound.  I was present and performed the entire surgery.  Ulyses Southward, PA-C did assist me throughout the case. An assistant was necessary  given the difficulty in approach, maintenance of reduction and ability to instrument the fracture.   Truitt Merle, MD [facility]

## 2023-01-12 NOTE — Plan of Care (Signed)
  Problem: Activity: Goal: Ability to tolerate increased activity will improve Outcome: Progressing   Problem: Health Behavior/Discharge Planning: Goal: Ability to manage health-related needs will improve Outcome: Progressing   Problem: Clinical Measurements: Goal: Ability to maintain clinical measurements within normal limits will improve Outcome: Progressing Goal: Respiratory complications will improve Outcome: Progressing   Problem: Nutrition: Goal: Adequate nutrition will be maintained Outcome: Progressing   Problem: Pain Management: Goal: General experience of comfort will improve Outcome: Progressing

## 2023-01-12 NOTE — Anesthesia Procedure Notes (Signed)
Procedure Name: Intubation Date/Time: 01/12/2023 12:50 PM  Performed by: Loleta Brithany Whitworth, CRNAPre-anesthesia Checklist: Patient identified, Patient being monitored, Timeout performed, Emergency Drugs available and Suction available Patient Re-evaluated:Patient Re-evaluated prior to induction Oxygen Delivery Method: Circle system utilized Preoxygenation: Pre-oxygenation with 100% oxygen Induction Type: IV induction Ventilation: Mask ventilation without difficulty and Oral airway inserted - appropriate to patient size Laryngoscope Size: Mac and 4 Grade View: Grade I Tube type: Oral Tube size: 7.0 mm Number of attempts: 1 Airway Equipment and Method: Stylet and Oral airway Placement Confirmation: ETT inserted through vocal cords under direct vision, positive ETCO2 and breath sounds checked- equal and bilateral Secured at: 24 cm Tube secured with: Tape Dental Injury: Teeth and Oropharynx as per pre-operative assessment

## 2023-01-12 NOTE — Plan of Care (Signed)
  Problem: Education: Goal: Knowledge of disease or condition will improve Outcome: Progressing Goal: Understanding of medication regimen will improve Outcome: Progressing Goal: Individualized Educational Video(s) Outcome: Progressing   Problem: Activity: Goal: Ability to tolerate increased activity will improve Outcome: Progressing   Problem: Cardiac: Goal: Ability to achieve and maintain adequate cardiopulmonary perfusion will improve Outcome: Progressing   Problem: Health Behavior/Discharge Planning: Goal: Ability to safely manage health-related needs after discharge will improve Outcome: Progressing   Problem: Education: Goal: Knowledge of General Education information will improve Description: Including pain rating scale, medication(s)/side effects and non-pharmacologic comfort measures Outcome: Progressing   Problem: Health Behavior/Discharge Planning: Goal: Ability to manage health-related needs will improve Outcome: Progressing   Problem: Clinical Measurements: Goal: Ability to maintain clinical measurements within normal limits will improve Outcome: Progressing Goal: Will remain free from infection Outcome: Progressing Goal: Diagnostic test results will improve Outcome: Progressing Goal: Respiratory complications will improve Outcome: Progressing Goal: Cardiovascular complication will be avoided Outcome: Progressing   Problem: Activity: Goal: Risk for activity intolerance will decrease Outcome: Progressing   Problem: Nutrition: Goal: Adequate nutrition will be maintained Outcome: Progressing   Problem: Coping: Goal: Level of anxiety will decrease Outcome: Progressing   Problem: Elimination: Goal: Will not experience complications related to bowel motility Outcome: Progressing Goal: Will not experience complications related to urinary retention Outcome: Progressing   Problem: Pain Management: Goal: General experience of comfort will  improve Outcome: Progressing   Problem: Safety: Goal: Ability to remain free from injury will improve Outcome: Progressing   Problem: Skin Integrity: Goal: Risk for impaired skin integrity will decrease Outcome: Progressing

## 2023-01-12 NOTE — Interval H&P Note (Signed)
Patient with persistent drainage from his right knee his appearance of this wound still not as improved due to the consistent drainage of his knee I feel that a repeat irrigation debridement is most appropriate.  Risks and benefits were discussed with the patient.  Risks included persistent infection persistent drainage, need for further surgery, need for hardware removal, chronic osteoarthritis, osteomyelitis, even possibility of anesthetic complications.  Patient agrees to proceed with surgery and consent was obtained.  Roby Lofts, MD [facility] [phone] (office) [email or web address]

## 2023-01-13 ENCOUNTER — Encounter (HOSPITAL_COMMUNITY): Payer: Self-pay | Admitting: Student

## 2023-01-13 DIAGNOSIS — I4891 Unspecified atrial fibrillation: Secondary | ICD-10-CM

## 2023-01-13 DIAGNOSIS — M00861 Arthritis due to other bacteria, right knee: Secondary | ICD-10-CM

## 2023-01-13 DIAGNOSIS — I10 Essential (primary) hypertension: Secondary | ICD-10-CM | POA: Diagnosis not present

## 2023-01-13 DIAGNOSIS — T148XXA Other injury of unspecified body region, initial encounter: Secondary | ICD-10-CM | POA: Diagnosis not present

## 2023-01-13 LAB — CBC
HCT: 30.8 % — ABNORMAL LOW (ref 39.0–52.0)
Hemoglobin: 10.2 g/dL — ABNORMAL LOW (ref 13.0–17.0)
MCH: 29.7 pg (ref 26.0–34.0)
MCHC: 33.1 g/dL (ref 30.0–36.0)
MCV: 89.5 fL (ref 80.0–100.0)
Platelets: 210 10*3/uL (ref 150–400)
RBC: 3.44 MIL/uL — ABNORMAL LOW (ref 4.22–5.81)
RDW: 13.8 % (ref 11.5–15.5)
WBC: 5.2 10*3/uL (ref 4.0–10.5)
nRBC: 0 % (ref 0.0–0.2)

## 2023-01-13 LAB — COMPREHENSIVE METABOLIC PANEL
ALT: 13 U/L (ref 0–44)
AST: 19 U/L (ref 15–41)
Albumin: 2 g/dL — ABNORMAL LOW (ref 3.5–5.0)
Alkaline Phosphatase: 121 U/L (ref 38–126)
Anion gap: 5 (ref 5–15)
BUN: 21 mg/dL — ABNORMAL HIGH (ref 6–20)
CO2: 26 mmol/L (ref 22–32)
Calcium: 8.8 mg/dL — ABNORMAL LOW (ref 8.9–10.3)
Chloride: 100 mmol/L (ref 98–111)
Creatinine, Ser: 1.07 mg/dL (ref 0.61–1.24)
GFR, Estimated: >60 mL/min (ref >60)
Glucose, Bld: 148 mg/dL — ABNORMAL HIGH (ref 70–99)
Potassium: 3.9 mmol/L (ref 3.5–5.1)
Sodium: 131 mmol/L — ABNORMAL LOW (ref 135–145)
Total Bilirubin: 0.9 mg/dL (ref <1.2)
Total Protein: 6.5 g/dL (ref 6.5–8.1)

## 2023-01-13 NOTE — Progress Notes (Signed)
Physical Therapy Treatment Patient Details Name: Jonathan Ibarra MRN: [id number] DOB: Mar 09, 1965 Today's Date: 01/13/2023   History of Present Illness Pt is a 57 y/o male presenting 10/28 to [facility] with severe right knee pain and drainage after a fall the night PTA, he is now s/p R knee I&D on 12/29/22. He recently had a R tibial ORIF on 12/13/22 after a falls incidence. VQQ:VZDGLOV abuse, [name], HTN, liver cirrhosis, polysubstance abuse.    PT Comments  Pt tolerated treatment well today. Pt received ambulating around room with RW. Pt educated on safety and new WB precautions as pt has been upgraded to TDWB even though he's been doing that all along. Pt able to ambulate to bathroom with CGA however required Min A to stand from low toilet. No change in DC/DME recs at this time. PT will continue to follow.    If plan is discharge home, [name];[name];Assistance with cooking/housework;Assist for transportation;Help with stairs or ramp for entrance   Can travel by private vehicle        Equipment Recommendations  None recommended by PT    Recommendations for Other Services       Precautions / Restrictions Precautions Precautions: Fall Restrictions Weight Bearing Restrictions: Yes RLE Weight Bearing: Touchdown weight bearing     Mobility  Bed Mobility               General bed mobility comments: Pt received ambulating around room.    Transfers Overall transfer level: Needs assistance Equipment used: Rolling walker (2 wheels) Transfers: Sit to/from Stand Sit to Stand: Min assist           General transfer comment: Min A from low toilet.    Ambulation/Gait Ambulation/Gait assistance: Contact guard assist, +2 safety/equipment Gait Distance (Feet): 15 Feet Assistive device: Rolling walker (2 wheels) Gait Pattern/deviations: Decreased stride length, Decreased stance  time - right, Step-to pattern, Trunk flexed Gait velocity: decreased     General Gait Details: Pt able to maintain TDWB.   Stairs             Wheelchair Mobility     Tilt Bed    Modified Rankin (Stroke Patients Only)       Balance Overall balance assessment: Needs assistance Sitting-balance support: No upper extremity supported, Feet supported Sitting balance-Leahy Scale: Good Sitting balance - Comments: sitting EOB   Standing balance support: Bilateral upper extremity supported Standing balance-Leahy Scale: Fair Standing balance comment: reliant on RW support                            Cognition Arousal: Alert Behavior During Therapy: Impulsive, WFL for tasks assessed/performed Overall Cognitive Status: Within Functional Limits for tasks assessed                                 General Comments: Pt received ambulating around room with RW. Pt remained quite impulsive during session.        Exercises      General Comments General comments (skin integrity, edema, etc.): VSS      Pertinent Vitals/Pain Pain Assessment Pain Assessment: Faces Faces Pain Scale: Hurts little more Pain Location: R knee Pain Descriptors / Indicators: Discomfort, Aching, Moaning Pain Intervention(s): Monitored during session    Home Living  Prior Function            PT Goals (current goals can now be found in the care plan section) Progress towards PT goals: Progressing toward goals    Frequency    Min 1X/week      PT Plan      Co-evaluation              AM-PAC PT "6 Clicks" Mobility   Outcome Measure  Help needed turning from your back to your side while in a flat bed without using bedrails?: None Help needed moving from lying on your back to sitting on the side of a flat bed without using bedrails?: A Little Help needed moving to and from a bed to a chair (including a wheelchair)?: A  Little Help needed standing up from a chair using your arms (e.g., wheelchair or bedside chair)?: A Little Help needed to walk in hospital room?: A Little Help needed climbing 3-5 steps with a railing? : Total 6 Click Score: 17    End of Session Equipment Utilized During Treatment: Gait belt Activity Tolerance: Patient tolerated treatment well Patient left: Other (comment) (Handoff to OT and nursing for patient shower) Nurse Communication: Mobility status;Other (comment) (Pt ready for shower) PT Visit Diagnosis: Muscle weakness (generalized) (M62.81);Other abnormalities of gait and mobility (R26.89)     Time: 1441-1456 PT Time Calculation (min) (ACUTE ONLY): 15 min  Charges:    $Gait Training: 8-22 mins PT General Charges $$ ACUTE PT VISIT: 1 Visit                     Shela Nevin, PT, DPT Acute Rehab Services [phone]    Gladys Damme 01/13/2023, 3:25 PM

## 2023-01-13 NOTE — Progress Notes (Signed)
Occupational Therapy Treatment Patient Details Name: Jonathan Ibarra MRN: [id number] DOB: [birth year] Today's Date: 01/13/2023   History of present illness Pt is a 57 y/o male presenting 10/28 to [facility] with severe right knee pain and drainage after a fall the night PTA, he is now s/p R knee I&D on 12/29/22. He recently had a R tibial ORIF on 12/13/22 after a falls incidence. JYN:WGNFAOZ abuse, [name], HTN, liver cirrhosis, polysubstance abuse.   OT comments  Pt completing showering/bathing routine with setup/supervision, wanted the door closed for privacy but told pt that leaving it cracked was permissive. During bathing routine pt got IV and R knee wet even though they were covered by his RN prior to beginning bath, OT assisted pt with drying off R incision and pt demonstrated good understanding of self wrapping. RN notified of concerns when sites became wet in shower. OT to continue to progress pt as able, needs further instruction on bathing without compromising IV and R incision.       If plan is discharge home, recommend the following:  Assistance with cooking/housework;Help with stairs or ramp for entrance;Assist for transportation;[name];A little help with walking and/or transfers   Equipment Recommendations  Tub/shower bench    Recommendations for Other Services      Precautions / Restrictions Precautions Precautions: Fall Restrictions Weight Bearing Restrictions: Yes RLE Weight Bearing: Touchdown weight bearing       Mobility Bed Mobility               General bed mobility comments: pt left sitting EOB, rec'd sitting on BSC in shower from PT and RN    Transfers Overall transfer level: Needs assistance Equipment used: Rolling walker (2 wheels) Transfers: Sit to/from Stand Sit to Stand: Contact guard assist           General transfer comment: [facility]     Balance Overall balance assessment: Needs  assistance Sitting-balance support: No upper extremity supported, Feet supported Sitting balance-Leahy Scale: Good Sitting balance - Comments: sitting EOB   Standing balance support: Bilateral upper extremity supported Standing balance-Leahy Scale: Fair Standing balance comment: reliant on RW support                           ADL either performed or assessed with clinical judgement   ADL Overall ADL's : Needs assistance/impaired         Upper Body Bathing: Set up;Sitting Upper Body Bathing Details (indicate cue type and reason): In shower, approved by MD Lower Body Bathing: Sitting/lateral leans;Set up;Supervison/ safety Lower Body Bathing Details (indicate cue type and reason): Pt in shower, approved by MD Upper Body Dressing : Sitting;Set up;Supervision/safety               Tub/ Shower Transfer: Walk-in shower;Ambulation;Shower seat;Rolling walker (2 wheels);Contact guard assist   Functional mobility during ADLs: Contact guard assist;Rolling walker (2 wheels) General ADL Comments: RN covered pt IV and R knee before showering,    Extremity/Trunk Assessment              Vision       Perception     Praxis      Cognition Arousal: Alert Behavior During Therapy: Impulsive Overall Cognitive Status: Within Functional Limits for tasks assessed  Exercises      Shoulder Instructions       General Comments Pt IV and R knee wrap noted to be wet upon return to sitting EOB. R knee wound draining/leaking, OT assisting pt with drying wound and rewrapping it. RN notified that both sites got wet, Pt unsure how he managed to get them both wet while being wrapped.    Pertinent Vitals/ Pain       Pain Assessment Pain Assessment: Faces Faces Pain Scale: Hurts even more Pain Location: R knee Pain Descriptors / Indicators: Discomfort, Aching, Moaning Pain Intervention(s): Limited activity within  patient's tolerance, Monitored during session, Repositioned  Home Living                                          Prior Functioning/Environment              Frequency  Min 1X/week        Progress Toward Goals  OT Goals(current goals can now be found in the care plan section)  Progress towards OT goals: Progressing toward goals  Acute Rehab OT Goals OT Goal Formulation: With patient Time For Goal Achievement: 01/26/23 Potential to Achieve Goals: Good  Plan      Co-evaluation                 AM-PAC OT "6 Clicks" Daily Activity     Outcome Measure   Help from another person eating meals?: None Help from another person taking care of personal grooming?: A Little Help from another person toileting, which includes using toliet, bedpan, or urinal?: A Little Help from another person bathing (including washing, rinsing, drying)?: A Little Help from another person to put on and taking off regular upper body clothing?: A Little Help from another person to put on and taking off regular lower body clothing?: A Lot 6 Click Score: 18    End of Session Equipment Utilized During Treatment: Gait belt;Rolling walker (2 wheels)  OT Visit Diagnosis: Pain;Unsteadiness on feet (R26.81);Other abnormalities of gait and mobility (R26.89);History of falling (Z91.81) Pain - Right/Left: Right Pain - part of body: Knee   Activity Tolerance Patient tolerated treatment well   Patient Left in bed;with call bell/phone within reach;Other (comment) (sitting EOB)   Nurse Communication Mobility status        Time: 4098-1191 OT Time Calculation (min): 29 min  Charges: OT General Charges $OT Visit: 1 Visit OT Treatments $Self Care/Home Management : 23-37 mins  01/13/2023  AB, OTR/L  Acute Rehabilitation Services  Office: [phone]   Tristan Schroeder 01/13/2023, 4:37 PM

## 2023-01-13 NOTE — Progress Notes (Signed)
PROGRESS NOTE        PATIENT DETAILS Name: Jonathan Ibarra Age: 57 y.o. Sex: male Date of Birth: [birth year] Admit Date: 12/27/2022 Admitting Physician Osvaldo Shipper, MD ONG:EXBMW, [name], MD  Brief Summary: Patient is a 57 y.o.  male with history of liver cirrhosis, HTN, polysubstance abuse-recent hospitalization from 10/13-10/18 following trauma with fall resulting in a right tibia plateau fracture requiring ORIF on 10/14-presented with purulent discharge from his right knee-he was thought to have septic arthritis and subsequently admitted to the hospitalist service.  Significant events: 10/13-10/18 >> hospitalization-right tibial plateau fracture following a fall-s/p ORIF.   10/28>> purulent discharge from right knee-septic arthritis-admit to [facility] 10/30>> irrigation/debridement of right knee 11/11>> found to have purulence from right knee 11/13>> repeat irrigation/debridement  Significant studies: 08/02>> echo: EF 55-60% 10/28>> x-ray right knee: Unchanged lateral plate/screw fixation across comminuted fracture involving medial/lateral tibial plateaus, moderate knee joint effusion 10/29>> CXR: No active disease  Significant microbiology data: 10/29>> blood culture: 1/2-Corynebacterium (contamination) 10/30>> right knee abscess culture: Klebsiella pneumoniae-ESBL 10/31>> blood culture: No growth 11/13>> right knee abscess culture: No growth  Procedures: 10/30>> irrigation and debridement of right knee 11/13>> repeat irrigation/debridement.  Consults: Ortho  Infectious disease  Subjective: no complaints-Having daily bowel movements-wants a break from physical therapy today.  Objective: Vitals: Blood pressure (!) 123/109, pulse (!) 103, temperature 97.8 F (36.6 C), temperature source Oral, resp. rate 20, height 6' (1.829 m), weight 117.9 kg, SpO2 98%.   Exam: Awake/alert Not in any distress Abdomen is soft nontender  nondistended Nonfocal exam  Pertinent Labs/Radiology:    Latest Ref Rng & Units 01/13/2023    4:15 AM 01/12/2023    4:26 AM 01/09/2023    6:38 AM  CBC  WBC 4.0 - 10.5 K/uL 5.2  4.7  5.6   Hemoglobin 13.0 - 17.0 g/dL 41.3  24.4  01.0   Hematocrit 39.0 - 52.0 % 30.8  31.9  33.8   Platelets 150 - 400 K/uL 210  225  254     Lab Results  Component Value Date   NA 131 (L) 01/13/2023   K 3.9 01/13/2023   CL 100 01/13/2023   CO2 26 01/13/2023      Assessment/Plan: Septic arthritis right knee (ESBL Klebsiella pneumoniae)-with recent history of ORIF on 10/14 with hardware in place S/p I&D 10/30-VAC discontinued 11/4-unfortunately purulence was observed on 11/11 in spite of being on IV antibiotics-underwent repeat irrigation/debridement on 11/13 Cultures from 11/13 pending Remains on IV Invanz Await further recommendations from infectious disease Ortho following-WBAT as tolerated.  PAF with RVR Rate controlled with oral metoprolol-continue midodrine for BP support Not a candidate for anticoagulation-in any events CHADS2-VASC score of 1  Liver cirrhosis-s/p TIPS Relatively well compensated Volume status relatively stable Continue lactulose. Diuretics on hold due to soft BP (on midodrine for BP support)  Polysubstance abuse Acknowledges ongoing meth/THC use and remote cocaine use-although UDS positive for cocaine Ongoing counseling  Obesity: Estimated body mass index is 35.26 kg/m as calculated from the following:   Height as of this encounter: 6' (1.829 m).   Weight as of this encounter: 117.9 kg.   Code status:   Code Status: Full Code   DVT Prophylaxis: enoxaparin (LOVENOX) injection 40 mg Start: 12/30/22 0900 SCDs Start: 12/28/22 0816   Family Communication: None at bedside   Disposition Plan: Status is: Inpatient  Remains inpatient appropriate because: Severity of illness   Planned Discharge Destination:Home   Diet: Diet Order             Diet regular  Room service appropriate? Yes; Fluid consistency: Thin  Diet effective now                     Antimicrobial agents: Anti-infectives (From admission, onward)    Start     Dose/Rate Route Frequency Ordered Stop   01/12/23 1312  tobramycin (NEBCIN) powder  Status:  Discontinued          As needed 01/12/23 1312 01/12/23 1329   12/31/22 1545  ertapenem (INVANZ) 1 g in sodium chloride 0.9 % 100 mL IVPB        1 g 200 mL/hr over 30 Minutes Intravenous Every 24 hours 12/31/22 1546 02/08/23 1000   12/31/22 1100  ertapenem (INVANZ) 1 g in sodium chloride 0.9 % 100 mL IVPB  Status:  Discontinued        1 g 200 mL/hr over 30 Minutes Intravenous Every 24 hours 12/31/22 0931 12/31/22 1546   12/30/22 1400  cefTRIAXone (ROCEPHIN) 2 g in sodium chloride 0.9 % 100 mL IVPB  Status:  Discontinued        2 g 200 mL/hr over 30 Minutes Intravenous Every 24 hours 12/30/22 1056 12/31/22 0931   12/29/22 1000  tobramycin (NEBCIN) powder  Status:  Discontinued          As needed 12/29/22 1022 12/29/22 1035   12/29/22 1000  vancomycin (VANCOCIN) powder  Status:  Discontinued          As needed 12/29/22 1023 12/29/22 1035   12/28/22 1400  piperacillin-tazobactam (ZOSYN) IVPB 3.375 g  Status:  Discontinued        3.375 g 12.5 mL/hr over 240 Minutes Intravenous Every 8 hours 12/28/22 1202 12/30/22 1056   12/28/22 1300  vancomycin (VANCOCIN) IVPB 1000 mg/200 mL premix  Status:  Discontinued        1,000 mg 200 mL/hr over 60 Minutes Intravenous Every 8 hours 12/28/22 1202 12/31/22 1029   12/28/22 0900  doxycycline (VIBRAMYCIN) 100 mg in dextrose 5 % 250 mL IVPB  Status:  Discontinued        100 mg 125 mL/hr over 120 Minutes Intravenous Every 12 hours 12/28/22 0826 12/28/22 1141   12/28/22 0115  vancomycin (VANCOCIN) IVPB 1000 mg/200 mL premix  Status:  Discontinued        1,000 mg 200 mL/hr over 60 Minutes Intravenous  Once 12/28/22 0107 12/28/22 0108   12/28/22 0115  piperacillin-tazobactam (ZOSYN) IVPB  3.375 g        3.375 g 100 mL/hr over 30 Minutes Intravenous  Once 12/28/22 0107 12/28/22 0142   12/28/22 0115  vancomycin (VANCOREADY) IVPB 2000 mg/400 mL        2,000 mg 200 mL/hr over 120 Minutes Intravenous  Once 12/28/22 0108 12/28/22 0342   12/28/22 0100  doxycycline (VIBRA-TABS) tablet 100 mg        100 mg Oral  Once 12/28/22 0049 12/28/22 0054        MEDICATIONS: Scheduled Meds:  Chlorhexidine Gluconate Cloth  6 each Topical Daily   cholecalciferol  1,000 Units Oral Daily   enoxaparin (LOVENOX) injection  40 mg Subcutaneous Q24H   folic acid  1 mg Oral Daily   lactulose  30 g Oral TID   metoprolol tartrate  50 mg Oral BID   midodrine  10 mg  Oral TID WC   polyethylene glycol  17 g Oral Daily   sodium chloride flush  10-40 mL Intracatheter Q12H   sodium chloride flush  3 mL Intravenous Q12H   thiamine  100 mg Oral Daily   Vitamin D (Ergocalciferol)  50,000 Units Oral Q7 days   Continuous Infusions:  ertapenem 1 g (01/13/23 0851)   PRN Meds:.acetaminophen, bisacodyl, diltiazem, HYDROmorphone (DILAUDID) injection, lactulose, methocarbamol, mouth rinse, [DISCONTINUED] oxyCODONE **OR** oxyCODONE, polyethylene glycol, sodium chloride flush, sodium chloride flush   I have personally reviewed following labs and imaging studies  LABORATORY DATA: CBC: Recent Labs  Lab 01/09/23 0638 01/12/23 0426 01/13/23 0415  WBC 5.6 4.7 5.2  HGB 11.1* 10.7* 10.2*  HCT 33.8* 31.9* 30.8*  MCV 91.1 89.4 89.5  PLT 254 225 210    Basic Metabolic Panel: Recent Labs  Lab 01/09/23 0638 01/12/23 0426 01/13/23 0415  NA 134* 132* 131*  K 4.9 3.9 3.9  CL 100 102 100  CO2 28 26 26   GLUCOSE 89 94 148*  BUN 10 9 21*  CREATININE 0.68 0.63 1.07  CALCIUM 8.9 8.7* 8.8*    GFR: Estimated Creatinine Clearance: 102.2 mL/min (by C-G formula based on SCr of 1.07 mg/dL).  Liver Function Tests: Recent Labs  Lab 01/09/23 0638 01/12/23 0426 01/13/23 0415  AST 21 21 19   ALT 14 14 13    ALKPHOS 117 103 121  BILITOT 0.8 1.3* 0.9  PROT 6.6 6.5 6.5  ALBUMIN 2.0* 2.1* 2.0*   No results for input(s): "LIPASE", "AMYLASE" in the last 168 hours. No results for input(s): "AMMONIA" in the last 168 hours.  Coagulation Profile: No results for input(s): "INR", "PROTIME" in the last 168 hours.  Cardiac Enzymes: No results for input(s): "CKTOTAL", "CKMB", "CKMBINDEX", "TROPONINI" in the last 168 hours.  BNP (last 3 results) No results for input(s): "PROBNP" in the last 8760 hours.  Lipid Profile: No results for input(s): "CHOL", "HDL", "LDLCALC", "TRIG", "CHOLHDL", "LDLDIRECT" in the last 72 hours.  Thyroid Function Tests: No results for input(s): "TSH", "T4TOTAL", "FREET4", "T3FREE", "THYROIDAB" in the last 72 hours.  Anemia Panel: No results for input(s): "VITAMINB12", "FOLATE", "FERRITIN", "TIBC", "IRON", "RETICCTPCT" in the last 72 hours.  Urine analysis:    Component Value Date/Time   COLORURINE AMBER (A) 10/01/2022 0142   APPEARANCEUR CLOUDY (A) 10/01/2022 0142   LABSPEC 1.025 10/01/2022 0142   PHURINE 5.0 10/01/2022 0142   GLUCOSEU NEGATIVE 10/01/2022 0142   HGBUR SMALL (A) 10/01/2022 0142   BILIRUBINUR NEGATIVE 10/01/2022 0142   KETONESUR NEGATIVE 10/01/2022 0142   PROTEINUR 30 (A) 10/01/2022 0142   NITRITE NEGATIVE 10/01/2022 0142   LEUKOCYTESUR LARGE (A) 10/01/2022 0142    Sepsis Labs: Lactic Acid, Venous    Component Value Date/Time   LATICACIDVEN 1.5 12/12/2022 2311    MICROBIOLOGY: Recent Results (from the past 240 hour(s))  Aerobic/Anaerobic Culture w Gram Stain (surgical/deep wound)     Status: None (Preliminary result)   Collection Time: 01/12/23  1:13 PM   Specimen: Path Tissue  Result Value Ref Range Status   Specimen Description TISSUE  Final   Special Requests RT KNEE A 1  Final   Gram Stain   Final    RARE WBC PRESENT, PREDOMINANTLY PMN NO ORGANISMS SEEN    Culture   Final    NO GROWTH < 24 HOURS Performed at [facility]  [facility], [address] [place], [place] [address]    Report Status PENDING  Incomplete  Aerobic/Anaerobic Culture w Gram  Stain (surgical/deep wound)     Status: None (Preliminary result)   Collection Time: 01/12/23  1:16 PM   Specimen: Path Tissue  Result Value Ref Range Status   Specimen Description TISSUE  Final   Special Requests RT KNEE B 2  Final   Gram Stain   Final    RARE WBC PRESENT, PREDOMINANTLY PMN NO ORGANISMS SEEN    Culture   Final    NO GROWTH < 24 HOURS Performed at [facility], [address], [place], [place] [address]    Report Status PENDING  Incomplete     RADIOLOGY STUDIES/RESULTS: DG Knee Right Port  Result Date: 01/12/2023 CLINICAL DATA:  Right tibial ORIF.  Postoperative infection. EXAM: PORTABLE RIGHT KNEE - 1-2 VIEW COMPARISON:  12/27/2022, 12/12/2022 FINDINGS: Postsurgical changes from proximal tibial ORIF lateral sideplate and screw fixation construct. Stable alignment of comminuted lateral and medial tibial plateau fracture. Fracture lines remain visible. No new fracture identified. Medial compartment joint space narrowing. No focal erosion. Large knee joint effusion, increased from prior. Diffuse soft tissue swelling. IMPRESSION: 1. Postsurgical changes from proximal tibial ORIF. Stable alignment of comminuted lateral and medial tibial plateau fracture. No erosive changes identified. 2. Large knee joint effusion, increased from prior. Appearance suspicious for septic arthritis. Correlate with joint fluid analysis. Electronically Signed   By: Duanne Guess D.O.   On: 01/12/2023 18:10     LOS: 16 days   Jeoffrey Massed, MD  [facility]    To contact the attending provider between 7A-7P or the covering provider during after hours 7P-7A, please log into the web site [email or web address] and access using universal Turner password for that web site. If you do not have the password, please call the hospital operator.  01/13/2023, 10:10 AM

## 2023-01-13 NOTE — Progress Notes (Addendum)
Orthopaedic Trauma Progress Note  SUBJECTIVE: Doing fairly well this morning.  Notable improvement in pain last night following surgery.  Pain started to increase this morning.  Asking for medication now.  Upon entering the room, patient sitting on the edge of the bed with dressing off of the right knee.  He is cleaning his incision with a washcloth.  I have encouraged him to allow the nurses to change the dressing with gloves on to avoid introducing any new bacteria into the wound.  New wound cultures obtained intraoperatively on 01/12/2023, no growth x 24 hours which is to be expected given patient has been on IV antibiotics for 2 weeks now.  OBJECTIVE:  Vitals:   01/13/23 0052 01/13/23 0854  BP: 104/62 (!) 123/109  Pulse: 91 (!) 103  Resp: 20   Temp:  97.8 F (36.6 C)  SpO2:      General: Sitting up on edge of bed, no acute distress Respiratory: No increased work of breathing.  Right lower extremity:  Swelling about the knee stable. Dressing changed, incision appears stable.  No active drainage currently.  Ankle dorsiflexion/plantarflexion is intact. No significant increase in pain with passive stretch of the toes.  Soreness to the calf improving.  No focal areas of significant pain.  +EHL/FHL intact.  Neurovascularly intact.  IMAGING: Repeat imaging right knee performed 01/12/2023 shows stable appearance to fixation.  No erosive changes noted.  LABS:  Results for orders placed or performed during the hospital encounter of 12/27/22 (from the past 24 hour(s))  Aerobic/Anaerobic Culture w Gram Stain (surgical/deep wound)     Status: None (Preliminary result)   Collection Time: 01/12/23  1:13 PM   Specimen: Path Tissue  Result Value Ref Range   Specimen Description TISSUE    Special Requests RT KNEE A 1    Gram Stain      RARE WBC PRESENT, PREDOMINANTLY PMN NO ORGANISMS SEEN    Culture      NO GROWTH < 24 HOURS Performed at [facility], [address], [place], [place]  [address]    Report Status PENDING   Aerobic/Anaerobic Culture w Gram Stain (surgical/deep wound)     Status: None (Preliminary result)   Collection Time: 01/12/23  1:16 PM   Specimen: Path Tissue  Result Value Ref Range   Specimen Description TISSUE    Special Requests RT KNEE B 2    Gram Stain      RARE WBC PRESENT, PREDOMINANTLY PMN NO ORGANISMS SEEN    Culture      NO GROWTH < 24 HOURS Performed at [facility], [address], [place], [place] [address]    Report Status PENDING   CBC     Status: Abnormal   Collection Time: 01/13/23  4:15 AM  Result Value Ref Range   WBC 5.2 4.0 - 10.5 K/uL   RBC 3.44 (L) 4.22 - 5.81 MIL/uL   Hemoglobin 10.2 (L) 13.0 - 17.0 g/dL   HCT 84.1 (L) 32.4 - 40.1 %   MCV 89.5 80.0 - 100.0 fL   MCH 29.7 26.0 - 34.0 pg   MCHC 33.1 30.0 - 36.0 g/dL   RDW 02.7 25.3 - 66.4 %   Platelets 210 150 - 400 K/uL   nRBC 0.0 0.0 - 0.2 %  Comprehensive metabolic panel     Status: Abnormal   Collection Time: 01/13/23  4:15 AM  Result Value Ref Range   Sodium 131 (L) 135 - 145 mmol/L  Potassium 3.9 3.5 - 5.1 mmol/L   Chloride 100 98 - 111 mmol/L   CO2 26 22 - 32 mmol/L   Glucose, Bld 148 (H) 70 - 99 mg/dL   BUN 21 (H) 6 - 20 mg/dL   Creatinine, Ser 7.82 0.61 - 1.24 mg/dL   Calcium 8.8 (L) 8.9 - 10.3 mg/dL   Total Protein 6.5 6.5 - 8.1 g/dL   Albumin 2.0 (L) 3.5 - 5.0 g/dL   AST 19 15 - 41 U/L   ALT 13 0 - 44 U/L   Alkaline Phosphatase 121 38 - 126 U/L   Total Bilirubin 0.9 <1.2 mg/dL   GFR, Estimated >95 >62 mL/min   Anion gap 5 5 - 15     ASSESSMENT: Jonathan Ibarra is a 57 y.o. male s/p IRRIGATION AND DEBRIDEMENT RIGHT KNEE on 01/12/2023 and 12/29/22  Previous ORIF of right tibial plateau fracture with repair of right lateral meniscus tear on 12/13/2022  PLAN: Weightbearing: TDWB RLE ROM: Okay for range of motion of the knee as tolerated Incisional and dressing care: Dressing changed today. Continue daily dressing changes by  nursing Showering: Ok to shower with wound covered.  Orthopedic device(s): None Pain management:  1. Tylenol 325 mg q 6 hours PRN 2. Robaxin 750 mg q 6 hours PRN 3. Oxycodone 5-10 mg q 4 hours PRN 4. Dilaudid 0.5-1 mg q 3 hours PRN VTE prophylaxis: Lovenox.  SCDs ID: Per infectious disease Foley/Lines:  No foley, KVO IVFs Impediments to Fracture Healing:  Infection.  Vitamin D level 28, continue supplementation Dispo: Continue therapies as tolerated.  Patient okay to shower from ortho standpoint.  Will continue to monitor wound but hopeful no additional I&D's will be required.  Follow - up plan: 2 weeks after d/c    Contact information:  Truitt Merle MD, Thyra Breed PA-C. After hours and holidays please check [email or web address] for group call information for [facility]   Thompson Caul, PA-C [phone] (office) [email or web address]

## 2023-01-14 DIAGNOSIS — I480 Paroxysmal atrial fibrillation: Secondary | ICD-10-CM | POA: Diagnosis not present

## 2023-01-14 DIAGNOSIS — M00861 Arthritis due to other bacteria, right knee: Secondary | ICD-10-CM | POA: Diagnosis not present

## 2023-01-14 DIAGNOSIS — M009 Pyogenic arthritis, unspecified: Secondary | ICD-10-CM | POA: Diagnosis not present

## 2023-01-14 DIAGNOSIS — I10 Essential (primary) hypertension: Secondary | ICD-10-CM | POA: Diagnosis not present

## 2023-01-14 DIAGNOSIS — I4891 Unspecified atrial fibrillation: Secondary | ICD-10-CM | POA: Diagnosis not present

## 2023-01-14 NOTE — Progress Notes (Signed)
[facility] Infectious Diseases Follow Up Note  Patient Identification: Patient Name: Jonathan Ibarra MRN: [id number] Admit Date: 12/27/2022  4:13 PM Age: 57 y.o.Today's Date: 01/14/2023  Reason for Visit: Septic arthritis, complicated with hardware  Principal Problem:   Septic arthritis of knee, right [facility]) Active Problems:   Essential hypertension   Atrial fibrillation with rapid ventricular response (HCC)   Knee effusion, right   Antibiotics:  Ertapenem 11/1- Total days of antibiotics day 19  Lines/Hardwares: Rt arm PICC +  Interval Events: remains afebrile, lab work from 11/14 NA 131, WBC 5.2, hemoglobin 10.2. s/p I&D of right knee by Ortho on 11/13 due to purulence noted from incision site   Assessment 57 year old male with history of alcoholic liver cirrhosis complicated with hepatic encephalopathy and thrombocytopenia, HTN polysubstance abuse, recent admission 12/12/2022 for trauma after fall requiring ORIF of right tibial plateau fracture on 12/13/2022 with Dr. Jena Gauss who presented after another fall with concern of drainage of right knee as well as A-fib with RVR, now with   # Right knee septic arthritis complicated with hardware -10/30  s/p I&D with culture growing ESBL Kleb pneumo. per OR note infection of right tibial plateau extended down to hardware, no acute osteomyelitis signs, murky looking synovial fluid noted  - 11/13 repeat I&D due to purulence noted on 11/11 from incision site , OR findings with purulent and fibrinous material around the hardware.  Proximally and distally appeared to be benign.  No signs of any significant signs of osteomyelitis of the proximal tibia. Or cx NG in 2 days    # Polysubstance abuse -reported active meth/cocaine use but denied IVDA. 10/15 hepatitis B surface antigen, HCV PCR, HIV nonreactive   # Liver cirrhosis - needs US abdomen for HCC screening every 6 months, fu with  Hepatology   Recommendations - Continue ertapenem, plan for 4 to 6 weeks IV antibiotics from 11/13 and possible suppression thereafter with p.o. omadacycline if sensitive.  Sensitivity for omadacycline to [facility] has been sent, pending  -weekly  CBC CMP ESR and CRP Dr Renold Don on this weekend and will fu culture, New ID team back on Monday.   Rest of the management as per the primary team. Thank you for the consult. Please page with pertinent questions or concerns.  ______________________________________________________________________ Subjective patient seen and examined at the bedside. Some soreness in the rt knee, Reports he was able to walk with the walker.   Past Medical History:  Diagnosis Date   Alcohol abuse    Cirrhosis of liver (HCC)    Hypertension    Past Surgical History:  Procedure Laterality Date   CHOLECYSTECTOMY     IR TIPS     IRRIGATION AND DEBRIDEMENT KNEE Right 12/29/2022   Procedure: IRRIGATION AND DEBRIDEMENT KNEE;  Surgeon: Roby Lofts, MD;  Location: [facility] OR;  Service: Orthopedics;  Laterality: Right;   IRRIGATION AND DEBRIDEMENT KNEE Right 01/12/2023   Procedure: IRRIGATION AND DEBRIDEMENT KNEE;  Surgeon: Roby Lofts, MD;  Location: [facility] OR;  Service: Orthopedics;  Laterality: Right;   ORIF TIBIA PLATEAU Right 12/13/2022   Procedure: OPEN REDUCTION INTERNAL FIXATION (ORIF) TIBIAL PLATEAU;  Surgeon: Roby Lofts, MD;  Location: [facility] OR;  Service: Orthopedics;  Laterality: Right;   Vitals BP 108/69   Pulse 95   Temp (!) 97.3 F (36.3 C) (Oral)   Resp 18   Ht 6' (1.829 m)   Wt 117.9 kg   SpO2 97%   BMI 35.26 kg/m  Physical Exam Constitutional:  adult male lying in the bed, non toxic appearing     Comments: HEENT wnl   Cardiovascular:     Rate and Rhythm: Normal rate and regular rhythm.     Heart sounds: s1s2  Pulmonary:     Effort: Pulmonary effort is normal.     Comments: Normal lung sounds   Abdominal:     Palpations: Abdomen is  soft.     Tenderness: non distended and non tender   Musculoskeletal:        General: No swelling or tenderness in peripheral joints. Rt knee wrapped in a bandage C/D/I. He said badage was somewhat tight although I did not appreciate such. Informed RN  Skin:    Comments: tattoos  Neurological:     General: awake, alert and oriented, following commands   Psychiatric:        Mood and Affect: Mood normal.    Pertinent Microbiology Results for orders placed or performed during the hospital encounter of 12/27/22  Blood culture (routine x 2)     Status: None   Collection Time: 12/28/22  1:28 AM   Specimen: BLOOD RIGHT ARM  Result Value Ref Range Status   Specimen Description BLOOD RIGHT ARM  Final   Special Requests   Final    BOTTLES DRAWN AEROBIC AND ANAEROBIC Blood Culture adequate volume   Culture   Final    NO GROWTH 5 DAYS Performed at [facility], [address] [place], [place] [address]    Report Status 01/02/2023 FINAL  Final  Blood culture (routine x 2)     Status: Abnormal   Collection Time: 12/28/22  1:29 AM   Specimen: BLOOD LEFT HAND  Result Value Ref Range Status   Specimen Description BLOOD LEFT HAND  Final   Special Requests   Final    BOTTLES DRAWN AEROBIC ONLY Blood Culture adequate volume   Culture  Setup Time   Final    GRAM POSITIVE RODS AEROBIC BOTTLE ONLY CRITICAL RESULT CALLED TO, READ BACK BY AND VERIFIED WITH: V BRYK,PHARMD@[id number]  12/29/22 MK    Culture (A)  Final    CORYNEBACTERIUM SPECIES Standardized susceptibility testing for this organism is not available. Performed at [facility], [address] [place], [place] [address]    Report Status 12/31/2022 FINAL  Final  Aerobic/Anaerobic Culture w Gram Stain (surgical/deep wound)     Status: None   Collection Time: 12/29/22  9:51 AM   Specimen: Joint, Other; Body Fluid  Result Value Ref Range Status   Specimen Description KNEE  Final   Special Requests RT KNEE ABSCESS  Final    Gram Stain   Final    ABUNDANT WBC PRESENT, PREDOMINANTLY PMN NO ORGANISMS SEEN    Culture   Final    RARE KLEBSIELLA PNEUMONIAE SUSCEPTIBILITIES PERFORMED ON PREVIOUS CULTURE WITHIN THE LAST 5 DAYS. NO ANAEROBES ISOLATED Performed at [facility], [address], [place], [place] [address]    Report Status 01/03/2023 FINAL  Final  Aerobic/Anaerobic Culture w Gram Stain (surgical/deep wound)     Status: None   Collection Time: 12/29/22  9:58 AM   Specimen: Joint, Other; Body Fluid  Result Value Ref Range Status   Specimen Description KNEE  Final   Special Requests B RT KNEE ABSCESS  Final   Gram Stain   Final    ABUNDANT WBC PRESENT, PREDOMINANTLY PMN NO ORGANISMS SEEN    Culture   Final  FEW KLEBSIELLA PNEUMONIAE Confirmed Extended Spectrum Beta-Lactamase Producer (ESBL).  In bloodstream infections from ESBL organisms, carbapenems are preferred over piperacillin/tazobactam. They are shown to have a lower risk of mortality. NO ANAEROBES ISOLATED Sent to [facility] for further susceptibility testing. SEE SEPARATE REPORT Performed at [facility], [address] [place], [place] [address]    Report Status 01/07/2023 FINAL  Final   Organism ID, Bacteria KLEBSIELLA PNEUMONIAE  Final      Susceptibility   Klebsiella pneumoniae - MIC*    AMPICILLIN >=32 RESISTANT Resistant     CEFEPIME 2 SENSITIVE Sensitive     CEFTAZIDIME RESISTANT Resistant     CEFTRIAXONE >=64 RESISTANT Resistant     CIPROFLOXACIN 0.5 INTERMEDIATE Intermediate     GENTAMICIN <=1 SENSITIVE Sensitive     IMIPENEM <=0.25 SENSITIVE Sensitive     TRIMETH/SULFA >=320 RESISTANT Resistant     AMPICILLIN/SULBACTAM 4 SENSITIVE Sensitive     PIP/TAZO <=4 SENSITIVE Sensitive ug/mL    * FEW KLEBSIELLA PNEUMONIAE  Culture, blood (Routine X 2) w Reflex to ID Panel     Status: None   Collection Time: 12/30/22  3:49 PM   Specimen: BLOOD RIGHT ARM  Result Value Ref Range Status   Specimen Description BLOOD  RIGHT ARM  Final   Special Requests   Final    BOTTLES DRAWN AEROBIC ONLY Blood Culture results may not be optimal due to an inadequate volume of blood received in culture bottles   Culture   Final    NO GROWTH 5 DAYS Performed at [facility], [address], [place], [place] [address]    Report Status 01/04/2023 FINAL  Final  Culture, blood (Routine X 2) w Reflex to ID Panel     Status: None   Collection Time: 12/30/22  3:49 PM   Specimen: BLOOD RIGHT HAND  Result Value Ref Range Status   Specimen Description BLOOD RIGHT HAND  Final   Special Requests   Final    BOTTLES DRAWN AEROBIC ONLY Blood Culture adequate volume   Culture   Final    NO GROWTH 5 DAYS Performed at [facility], [address], [place], [place] [address]    Report Status 01/04/2023 FINAL  Final  Aerobic/Anaerobic Culture w Gram Stain (surgical/deep wound)     Status: None (Preliminary result)   Collection Time: 01/12/23  1:13 PM   Specimen: Path Tissue  Result Value Ref Range Status   Specimen Description TISSUE  Final   Special Requests RT KNEE A 1  Final   Gram Stain   Final    RARE WBC PRESENT, PREDOMINANTLY PMN NO ORGANISMS SEEN    Culture   Final    NO GROWTH 2 DAYS NO ANAEROBES ISOLATED; CULTURE IN PROGRESS FOR 5 DAYS Performed at [facility], [address] [place], [place] [address]    Report Status PENDING  Incomplete  Aerobic/Anaerobic Culture w Gram Stain (surgical/deep wound)     Status: None (Preliminary result)   Collection Time: 01/12/23  1:16 PM   Specimen: Path Tissue  Result Value Ref Range Status   Specimen Description TISSUE  Final   Special Requests RT KNEE B 2  Final   Gram Stain   Final    RARE WBC PRESENT, PREDOMINANTLY PMN NO ORGANISMS SEEN    Culture   Final    NO GROWTH 2 DAYS NO ANAEROBES ISOLATED; CULTURE IN PROGRESS FOR 5 DAYS Performed at [facility], [address] [place], [place] [address]    Report Status PENDING  Incomplete     Pertinent Lab.    Latest Ref Rng & Units 01/13/2023    4:15 AM 01/12/2023    4:26 AM 01/09/2023    6:38 AM  CBC  WBC 4.0 - 10.5 K/uL 5.2  4.7  5.6   Hemoglobin 13.0 - 17.0 g/dL 15.1  76.1  60.7   Hematocrit 39.0 - 52.0 % 30.8  31.9  33.8   Platelets 150 - 400 K/uL 210  225  254       Latest Ref Rng & Units 01/13/2023    4:15 AM 01/12/2023    4:26 AM 01/09/2023    6:38 AM  CMP  Glucose 70 - 99 mg/dL 371  94  89   BUN 6 - 20 mg/dL 21  9  10    Creatinine 0.61 - 1.24 mg/dL 0.62  6.94  8.54   Sodium 135 - 145 mmol/L 131  132  134   Potassium 3.5 - 5.1 mmol/L 3.9  3.9  4.9   Chloride 98 - 111 mmol/L 100  102  100   CO2 22 - 32 mmol/L 26  26  28    Calcium 8.9 - 10.3 mg/dL 8.8  8.7  8.9   Total Protein 6.5 - 8.1 g/dL 6.5  6.5  6.6   Total Bilirubin <1.2 mg/dL 0.9  1.3  0.8   Alkaline Phos 38 - 126 U/L 121  103  117   AST 15 - 41 U/L 19  21  21    ALT 0 - 44 U/L 13  14  14     Pertinent Imaging today Plain films and CT images have been personally visualized and interpreted; radiology reports have been reviewed. Decision making incorporated into the Impression    I have personally spent 57 minutes involved in face-to-face and non-face-to-face activities for this patient on the day of the visit. Professional time spent includes the following activities: Preparing to see the patient (review of tests), Obtaining and/or reviewing separately obtained history (admission/discharge record), Performing a medically appropriate examination and/or evaluation , Ordering medications/tests/procedures, referring and communicating with other health care professionals, Documenting clinical information in the EMR, Independently interpreting results (not separately reported), Communicating results to the patient/family/caregiver, Counseling and educating the patient/family/caregiver and Care coordination (not separately reported).   Plan d/w requesting provider as well as ID pharm D  Of note, portions  of this note may have been created with voice recognition software. While this note has been edited for accuracy, occasional wrong-word or 'sound-a-like' substitutions may have occurred due to the inherent limitations of voice recognition software.   Electronically signed by:   Odette Fraction, MD Infectious Disease Physician [facility] Pager: [phone]

## 2023-01-14 NOTE — Progress Notes (Signed)
Orthopaedic Trauma Progress Note  SUBJECTIVE: Doing ok this morning. Agitated that his diet is restricted to heart healthy. Wanted two sausage biscuits for breakfast but they would only allow him to have one. I have switched him to a regular diet. Pain continues to wax and wane. Received oxycodone about 3.5 hours ago. Was able to shower yesterday, he was happy about that. Per patient's report, dressing to the right knee changed overnight. Notes some drainage through the kerlix this AM.  New wound cultures obtained intraoperatively on 01/12/2023, no growth x 2 day which is to be expected given patient has been on IV antibiotics for 2 weeks now.  OBJECTIVE:  Vitals:   01/14/23 0533 01/14/23 0800  BP:  108/69  Pulse: 95   Resp:    Temp:    SpO2: 97%     General: Sitting up in bed, no acute distress Respiratory: No increased work of breathing.  Right lower extremity:  Dressing changed, incision with active serosanguinous drainage. I was unable to express any purulence.  Ankle dorsiflexion/plantarflexion is intact. No significant increase in pain with passive stretch of the toes.  Soreness to the calf, unchanged.  No focal areas of significant pain.  +EHL/FHL intact.  Neurovascularly intact.  IMAGING: Repeat imaging right knee performed 01/12/2023 shows stable appearance to fixation.  No erosive changes noted.  LABS:  No results found for this or any previous visit (from the past 24 hour(s)).    ASSESSMENT: Jonathan Ibarra is a 57 y.o. male s/p IRRIGATION AND DEBRIDEMENT RIGHT KNEE on 01/12/2023 and 12/29/22  Previous ORIF of right tibial plateau fracture with repair of right lateral meniscus tear on 12/13/2022  PLAN: Weightbearing: TDWB RLE ROM: Okay for range of motion of the knee as tolerated Incisional and dressing care: Dressing changed today. Continue daily dressing changes by nursing Showering: Ok to shower with wound covered.  Orthopedic device(s): None Pain management:   1. Tylenol 325 mg q 6 hours PRN 2. Robaxin 750 mg q 6 hours PRN 3. Oxycodone 5-10 mg q 4 hours PRN 4. Dilaudid 0.5-1 mg q 3 hours PRN VTE prophylaxis: Lovenox.  SCDs ID: Per infectious disease Foley/Lines:  No foley, KVO IVFs Impediments to Fracture Healing:  Infection.  Vitamin D level 28, continue supplementation Dispo: Continue therapies as tolerated.  Patient okay to shower from ortho standpoint.  Will continue to monitor wound but hopeful no additional I&D's will be required.  Follow - up plan: 2 weeks after d/c    Contact information:  Truitt Merle MD, Thyra Breed PA-C. After hours and holidays please check [email or web address] for group call information for [facility]   Thompson Caul, PA-C [phone] (office) [email or web address]

## 2023-01-14 NOTE — Plan of Care (Signed)
  Problem: Education: Goal: Knowledge of disease or condition will improve Outcome: Progressing Goal: Understanding of medication regimen will improve Outcome: Progressing Goal: Individualized Educational Video(s) Outcome: Progressing   Problem: Activity: Goal: Ability to tolerate increased activity will improve Outcome: Progressing   Problem: Cardiac: Goal: Ability to achieve and maintain adequate cardiopulmonary perfusion will improve Outcome: Progressing   Problem: Health Behavior/Discharge Planning: Goal: Ability to safely manage health-related needs after discharge will improve Outcome: Progressing   Problem: Education: Goal: Knowledge of General Education information will improve Description: Including pain rating scale, medication(s)/side effects and non-pharmacologic comfort measures Outcome: Progressing   Problem: Health Behavior/Discharge Planning: Goal: Ability to manage health-related needs will improve Outcome: Progressing   Problem: Clinical Measurements: Goal: Ability to maintain clinical measurements within normal limits will improve Outcome: Progressing Goal: Will remain free from infection Outcome: Progressing Goal: Diagnostic test results will improve Outcome: Progressing Goal: Respiratory complications will improve Outcome: Progressing Goal: Cardiovascular complication will be avoided Outcome: Progressing   Problem: Activity: Goal: Risk for activity intolerance will decrease Outcome: Progressing   Problem: Nutrition: Goal: Adequate nutrition will be maintained Outcome: Progressing   Problem: Coping: Goal: Level of anxiety will decrease Outcome: Progressing   Problem: Elimination: Goal: Will not experience complications related to bowel motility Outcome: Progressing Goal: Will not experience complications related to urinary retention Outcome: Progressing   Problem: Pain Management: Goal: General experience of comfort will  improve Outcome: Progressing   Problem: Safety: Goal: Ability to remain free from injury will improve Outcome: Progressing   Problem: Skin Integrity: Goal: Risk for impaired skin integrity will decrease Outcome: Progressing

## 2023-01-14 NOTE — Progress Notes (Signed)
PROGRESS NOTE        PATIENT DETAILS Name: Jonathan Ibarra Age: 57 y.o. Sex: male Date of Birth: 16-Aug-1965 Admit Date: 12/27/2022 Admitting Physician Osvaldo Shipper, MD NWG:[id number], [name], MD  Brief Summary: Patient is a 57 y.o.  male with history of liver cirrhosis, HTN, polysubstance abuse-recent hospitalization from 10/13-10/18 following trauma with fall resulting in a right tibia plateau fracture requiring ORIF on 10/14-presented with purulent discharge from his right knee-he was thought to have septic arthritis and subsequently admitted to the hospitalist service.  Significant events: 10/13-10/18 >> hospitalization-right tibial plateau fracture following a fall-s/p ORIF.   10/28>> purulent discharge from right knee-septic arthritis-admit to [facility] 10/30>> irrigation/debridement of right knee 11/11>> found to have purulence from right knee 11/13>> repeat irrigation/debridement  Significant studies: 08/02>> echo: EF 55-60% 10/28>> x-ray right knee: Unchanged lateral plate/screw fixation across comminuted fracture involving medial/lateral tibial plateaus, moderate knee joint effusion 10/29>> CXR: No active disease  Significant microbiology data: 10/29>> blood culture: 1/2-Corynebacterium (contamination) 10/30>> right knee abscess culture: Klebsiella pneumoniae-ESBL 10/31>> blood culture: No growth 11/13>> right knee abscess culture: No growth  Procedures: 10/30>> irrigation and debridement of right knee 11/13>> repeat irrigation/debridement.  Consults: Ortho  Infectious disease  Subjective: No complaints-lying comfortably in bed.  Per nursing staff he occasionally will change knee dressing himself.  Objective: Vitals: Blood pressure (!) 122/90, pulse 95, temperature (!) 97.3 F (36.3 C), temperature source Oral, resp. rate 18, height 6' (1.829 m), weight 117.9 kg, SpO2 97%.   Exam: Gen Exam:Alert awake-not in any  distress HEENT:atraumatic, normocephalic Chest: B/L clear to auscultation anteriorly CVS:S1S2 regular Abdomen:soft non tender, non distended Extremities:no edema Neurology: Non focal Skin: no rash  Pertinent Labs/Radiology:    Latest Ref Rng & Units 01/13/2023    4:15 AM 01/12/2023    4:26 AM 01/09/2023    6:38 AM  CBC  WBC 4.0 - 10.5 K/uL 5.2  4.7  5.6   Hemoglobin 13.0 - 17.0 g/dL 30.8  65.7  84.6   Hematocrit 39.0 - 52.0 % 30.8  31.9  33.8   Platelets 150 - 400 K/uL 210  225  254     Lab Results  Component Value Date   NA 131 (L) 01/13/2023   K 3.9 01/13/2023   CL 100 01/13/2023   CO2 26 01/13/2023      Assessment/Plan: Septic arthritis right knee (ESBL Klebsiella pneumoniae)-with recent history of ORIF on 10/14 with hardware in place S/p I&D 10/30-VAC discontinued 11/4-unfortunately purulence was observed on 11/11 inspite of being on IV antibiotics-underwent repeat irrigation/debridement on 11/13 Cultures from 11/13 pending Remains on IV Invanz Will discuss with infectious disease-whether we need to extend IV antibiotics -6 weeks from 11/13 (second debridement)  Ortho following-WBAT as tolerated.  PAF with RVR Rate controlled with oral metoprolol-continue midodrine for BP support Not a candidate for anticoagulation-in any events CHADS2-VASC score of 1  Liver cirrhosis-s/p TIPS Relatively well compensated Volume status relatively stable Continue lactulose. Diuretics on hold due to soft BP (on midodrine for BP support)  Polysubstance abuse Acknowledges ongoing meth/THC use and remote cocaine use-although UDS positive for cocaine Ongoing counseling  Obesity: Estimated body mass index is 35.26 kg/m as calculated from the following:   Height as of this encounter: 6' (1.829 m).   Weight as of this encounter: 117.9 kg.   Code status:   Code  Status: Full Code   DVT Prophylaxis: enoxaparin (LOVENOX) injection 40 mg Start: 12/30/22 0900 SCDs Start: 12/28/22  0816   Family Communication: None at bedside   Disposition Plan: Status is: Inpatient Remains inpatient appropriate because: Severity of illness   Planned Discharge Destination:Home   Diet: Diet Order             Diet regular Room service appropriate? Yes; Fluid consistency: Thin  Diet effective now                     Antimicrobial agents: Anti-infectives (From admission, onward)    Start     Dose/Rate Route Frequency Ordered Stop   01/12/23 1312  tobramycin (NEBCIN) powder  Status:  Discontinued          As needed 01/12/23 1312 01/12/23 1329   12/31/22 1545  ertapenem (INVANZ) 1 g in sodium chloride 0.9 % 100 mL IVPB        1 g 200 mL/hr over 30 Minutes Intravenous Every 24 hours 12/31/22 1546 02/08/23 1000   12/31/22 1100  ertapenem (INVANZ) 1 g in sodium chloride 0.9 % 100 mL IVPB  Status:  Discontinued        1 g 200 mL/hr over 30 Minutes Intravenous Every 24 hours 12/31/22 0931 12/31/22 1546   12/30/22 1400  cefTRIAXone (ROCEPHIN) 2 g in sodium chloride 0.9 % 100 mL IVPB  Status:  Discontinued        2 g 200 mL/hr over 30 Minutes Intravenous Every 24 hours 12/30/22 1056 12/31/22 0931   12/29/22 1000  tobramycin (NEBCIN) powder  Status:  Discontinued          As needed 12/29/22 1022 12/29/22 1035   12/29/22 1000  vancomycin (VANCOCIN) powder  Status:  Discontinued          As needed 12/29/22 1023 12/29/22 1035   12/28/22 1400  piperacillin-tazobactam (ZOSYN) IVPB 3.375 g  Status:  Discontinued        3.375 g 12.5 mL/hr over 240 Minutes Intravenous Every 8 hours 12/28/22 1202 12/30/22 1056   12/28/22 1300  vancomycin (VANCOCIN) IVPB 1000 mg/200 mL premix  Status:  Discontinued        1,000 mg 200 mL/hr over 60 Minutes Intravenous Every 8 hours 12/28/22 1202 12/31/22 1029   12/28/22 0900  doxycycline (VIBRAMYCIN) 100 mg in dextrose 5 % 250 mL IVPB  Status:  Discontinued        100 mg 125 mL/hr over 120 Minutes Intravenous Every 12 hours 12/28/22 0826 12/28/22  1141   12/28/22 0115  vancomycin (VANCOCIN) IVPB 1000 mg/200 mL premix  Status:  Discontinued        1,000 mg 200 mL/hr over 60 Minutes Intravenous  Once 12/28/22 0107 12/28/22 0108   12/28/22 0115  piperacillin-tazobactam (ZOSYN) IVPB 3.375 g        3.375 g 100 mL/hr over 30 Minutes Intravenous  Once 12/28/22 0107 12/28/22 0142   12/28/22 0115  vancomycin (VANCOREADY) IVPB 2000 mg/400 mL        2,000 mg 200 mL/hr over 120 Minutes Intravenous  Once 12/28/22 0108 12/28/22 0342   12/28/22 0100  doxycycline (VIBRA-TABS) tablet 100 mg        100 mg Oral  Once 12/28/22 0049 12/28/22 0054        MEDICATIONS: Scheduled Meds:  Chlorhexidine Gluconate Cloth  6 each Topical Daily   cholecalciferol  1,000 Units Oral Daily   enoxaparin (LOVENOX) injection  40 mg Subcutaneous  Q24H   folic acid  1 mg Oral Daily   lactulose  30 g Oral TID   metoprolol tartrate  50 mg Oral BID   midodrine  10 mg Oral TID WC   polyethylene glycol  17 g Oral Daily   sodium chloride flush  10-40 mL Intracatheter Q12H   sodium chloride flush  3 mL Intravenous Q12H   thiamine  100 mg Oral Daily   Vitamin D (Ergocalciferol)  50,000 Units Oral Q7 days   Continuous Infusions:  ertapenem 1 g (01/13/23 0851)   PRN Meds:.acetaminophen, bisacodyl, diltiazem, HYDROmorphone (DILAUDID) injection, lactulose, methocarbamol, mouth rinse, [DISCONTINUED] oxyCODONE **OR** oxyCODONE, polyethylene glycol, sodium chloride flush, sodium chloride flush   I have personally reviewed following labs and imaging studies  LABORATORY DATA: CBC: Recent Labs  Lab 01/09/23 0638 01/12/23 0426 01/13/23 0415  WBC 5.6 4.7 5.2  HGB 11.1* 10.7* 10.2*  HCT 33.8* 31.9* 30.8*  MCV 91.1 89.4 89.5  PLT 254 225 210    Basic Metabolic Panel: Recent Labs  Lab 01/09/23 0638 01/12/23 0426 01/13/23 0415  NA 134* 132* 131*  K 4.9 3.9 3.9  CL 100 102 100  CO2 28 26 26   GLUCOSE 89 94 148*  BUN 10 9 21*  CREATININE 0.68 0.63 1.07  CALCIUM  8.9 8.7* 8.8*    GFR: Estimated Creatinine Clearance: 102.2 mL/min (by C-G formula based on SCr of 1.07 mg/dL).  Liver Function Tests: Recent Labs  Lab 01/09/23 0638 01/12/23 0426 01/13/23 0415  AST 21 21 19   ALT 14 14 13   ALKPHOS 117 103 121  BILITOT 0.8 1.3* 0.9  PROT 6.6 6.5 6.5  ALBUMIN 2.0* 2.1* 2.0*   No results for input(s): "LIPASE", "AMYLASE" in the last 168 hours. No results for input(s): "AMMONIA" in the last 168 hours.  Coagulation Profile: No results for input(s): "INR", "PROTIME" in the last 168 hours.  Cardiac Enzymes: No results for input(s): "CKTOTAL", "CKMB", "CKMBINDEX", "TROPONINI" in the last 168 hours.  BNP (last 3 results) No results for input(s): "PROBNP" in the last 8760 hours.  Lipid Profile: No results for input(s): "CHOL", "HDL", "LDLCALC", "TRIG", "CHOLHDL", "LDLDIRECT" in the last 72 hours.  Thyroid Function Tests: No results for input(s): "TSH", "T4TOTAL", "FREET4", "T3FREE", "THYROIDAB" in the last 72 hours.  Anemia Panel: No results for input(s): "VITAMINB12", "FOLATE", "FERRITIN", "TIBC", "IRON", "RETICCTPCT" in the last 72 hours.  Urine analysis:    Component Value Date/Time   COLORURINE AMBER (A) 10/01/2022 0142   APPEARANCEUR CLOUDY (A) 10/01/2022 0142   LABSPEC 1.025 10/01/2022 0142   PHURINE 5.0 10/01/2022 0142   GLUCOSEU NEGATIVE 10/01/2022 0142   HGBUR SMALL (A) 10/01/2022 0142   BILIRUBINUR NEGATIVE 10/01/2022 0142   KETONESUR NEGATIVE 10/01/2022 0142   PROTEINUR 30 (A) 10/01/2022 0142   NITRITE NEGATIVE 10/01/2022 0142   LEUKOCYTESUR LARGE (A) 10/01/2022 0142    Sepsis Labs: Lactic Acid, Venous    Component Value Date/Time   LATICACIDVEN 1.5 12/12/2022 2311    MICROBIOLOGY: Recent Results (from the past 240 hour(s))  Aerobic/Anaerobic Culture w Gram Stain (surgical/deep wound)     Status: None (Preliminary result)   Collection Time: 01/12/23  1:13 PM   Specimen: Path Tissue  Result Value Ref Range Status    Specimen Description TISSUE  Final   Special Requests RT KNEE A 1  Final   Gram Stain   Final    RARE WBC PRESENT, PREDOMINANTLY PMN NO ORGANISMS SEEN    Culture   Final  NO GROWTH < 24 HOURS Performed at [facility], [address], [place], [place] [address]    Report Status PENDING  Incomplete  Aerobic/Anaerobic Culture w Gram Stain (surgical/deep wound)     Status: None (Preliminary result)   Collection Time: 01/12/23  1:16 PM   Specimen: Path Tissue  Result Value Ref Range Status   Specimen Description TISSUE  Final   Special Requests RT KNEE B 2  Final   Gram Stain   Final    RARE WBC PRESENT, PREDOMINANTLY PMN NO ORGANISMS SEEN    Culture   Final    NO GROWTH < 24 HOURS Performed at [facility], [address], [place], [place] [address]    Report Status PENDING  Incomplete     RADIOLOGY STUDIES/RESULTS: DG Knee Right Port  Result Date: 01/12/2023 CLINICAL DATA:  Right tibial ORIF.  Postoperative infection. EXAM: PORTABLE RIGHT KNEE - 1-2 VIEW COMPARISON:  12/27/2022, 12/12/2022 FINDINGS: Postsurgical changes from proximal tibial ORIF lateral sideplate and screw fixation construct. Stable alignment of comminuted lateral and medial tibial plateau fracture. Fracture lines remain visible. No new fracture identified. Medial compartment joint space narrowing. No focal erosion. Large knee joint effusion, increased from prior. Diffuse soft tissue swelling. IMPRESSION: 1. Postsurgical changes from proximal tibial ORIF. Stable alignment of comminuted lateral and medial tibial plateau fracture. No erosive changes identified. 2. Large knee joint effusion, increased from prior. Appearance suspicious for septic arthritis. Correlate with joint fluid analysis. Electronically Signed   By: Duanne Guess D.O.   On: 01/12/2023 18:10     LOS: 17 days   Jeoffrey Massed, MD  [facility]    To contact the attending provider between 7A-7P or the covering provider  during after hours 7P-7A, please log into the web site [email or web address] and access using universal [place] password for that web site. If you do not have the password, please call the hospital operator.  01/14/2023, 9:22 AM

## 2023-01-15 DIAGNOSIS — I10 Essential (primary) hypertension: Secondary | ICD-10-CM | POA: Diagnosis not present

## 2023-01-15 DIAGNOSIS — I480 Paroxysmal atrial fibrillation: Secondary | ICD-10-CM | POA: Diagnosis not present

## 2023-01-15 DIAGNOSIS — I4891 Unspecified atrial fibrillation: Secondary | ICD-10-CM | POA: Diagnosis not present

## 2023-01-15 DIAGNOSIS — M009 Pyogenic arthritis, unspecified: Secondary | ICD-10-CM | POA: Diagnosis not present

## 2023-01-15 LAB — GLUCOSE, CAPILLARY: Glucose-Capillary: 99 mg/dL (ref 70–99)

## 2023-01-15 NOTE — Progress Notes (Signed)
Patient changed own surgical dressing without notifying nursing staff. Patient educated to allow nursing staff to change daily dressings and of increased risk of infection. Patient continues to need frequent reeducation. MD notified of patient noncompliance.

## 2023-01-15 NOTE — Plan of Care (Signed)
  Problem: Education: Goal: Knowledge of disease or condition will improve Outcome: Progressing Goal: Understanding of medication regimen will improve Outcome: Progressing Goal: Individualized Educational Video(s) Outcome: Progressing   Problem: Activity: Goal: Ability to tolerate increased activity will improve Outcome: Progressing   Problem: Cardiac: Goal: Ability to achieve and maintain adequate cardiopulmonary perfusion will improve Outcome: Progressing   Problem: Health Behavior/Discharge Planning: Goal: Ability to safely manage health-related needs after discharge will improve Outcome: Progressing   Problem: Education: Goal: Knowledge of General Education information will improve Description: Including pain rating scale, medication(s)/side effects and non-pharmacologic comfort measures Outcome: Progressing   Problem: Health Behavior/Discharge Planning: Goal: Ability to manage health-related needs will improve Outcome: Progressing   Problem: Clinical Measurements: Goal: Ability to maintain clinical measurements within normal limits will improve Outcome: Progressing Goal: Will remain free from infection Outcome: Progressing Goal: Diagnostic test results will improve Outcome: Progressing Goal: Respiratory complications will improve Outcome: Progressing Goal: Cardiovascular complication will be avoided Outcome: Progressing   Problem: Activity: Goal: Risk for activity intolerance will decrease Outcome: Progressing   Problem: Nutrition: Goal: Adequate nutrition will be maintained Outcome: Progressing   Problem: Coping: Goal: Level of anxiety will decrease Outcome: Progressing   Problem: Elimination: Goal: Will not experience complications related to bowel motility Outcome: Progressing Goal: Will not experience complications related to urinary retention Outcome: Progressing   Problem: Pain Management: Goal: General experience of comfort will  improve Outcome: Progressing   Problem: Skin Integrity: Goal: Risk for impaired skin integrity will decrease Outcome: Progressing   Problem: Safety: Goal: Ability to remain free from injury will improve Outcome: Not Progressing

## 2023-01-15 NOTE — Progress Notes (Signed)
Patient experienced unwitnessed fall to floor at 0215. Patient had ambulated to bathroom using front wheel walker prior to fall. On his way back to his bed, patient states his left knee gave out on him and he fell to floor. Patient states he did not strike his head and did not sustain any injury from the fall. Patient called for assistance on his call bell and found sitting in floor beside bed. Patient assisted back to bed, and vital signs were obtained, all wnl. Dow Adolph MD notified of fall. Educated patient again on need for fall safety measures. Patient states he forgot to put his socks on prior to ambulating but will next time. Offered patient urinal that he could use at bedside and patient refused. Patient also adamantly refusing bed alarms and refusing to call for ambulation assistance.

## 2023-01-15 NOTE — Progress Notes (Addendum)
PROGRESS NOTE        PATIENT DETAILS Name: Jonathan Ibarra Age: 57 y.o. Sex: male Date of Birth: 05-25-1965 Admit Date: 12/27/2022 Admitting Physician Osvaldo Shipper, MD [id number], [name], MD  Brief Summary: Patient is a 57 y.o.  male with history of liver cirrhosis, HTN, polysubstance abuse-recent hospitalization from 10/13-10/18 following trauma with fall resulting in a right tibia plateau fracture requiring ORIF on 10/14-presented with purulent discharge from his right knee-he was thought to have septic arthritis and subsequently admitted to the hospitalist service.  Significant events: 10/13-10/18 >> hospitalization-right tibial plateau fracture following a fall-s/p ORIF.   10/28>> purulent discharge from right knee-septic arthritis-admit to [facility] 10/30>> irrigation/debridement of right knee 11/11>> found to have purulence from right knee 11/13>> repeat irrigation/debridement  Significant studies: 08/02>> echo: EF 55-60% 10/28>> x-ray right knee: Unchanged lateral plate/screw fixation across comminuted fracture involving medial/lateral tibial plateaus, moderate knee joint effusion 10/29>> CXR: No active disease  Significant microbiology data: 10/29>> blood culture: 1/2-Corynebacterium (contamination) 10/30>> right knee abscess culture: Klebsiella pneumoniae-ESBL 10/31>> blood culture: No growth 11/13>> right knee abscess culture: No growth  Procedures: 10/30>> irrigation and debridement of right knee 11/13>> repeat irrigation/debridement.  Consults: Ortho  Infectious disease  Subjective: No major issues overnight-when I walked in this morning-he was trying to change dressing himself-spoke with overnight RN-and with the RN-he apparently has been changing his dressing himself.  Patient was advised against this-and that he will put himself at recurrent risk for infection.  Objective: Vitals: Blood pressure 111/74, pulse 86, temperature 97.8  F (36.6 C), resp. rate 18, height 6' (1.829 m), weight 117.9 kg, SpO2 98%.   Exam: Gen Exam:Alert awake-not in any distress HEENT:atraumatic, normocephalic Chest: B/L clear to auscultation anteriorly CVS:S1S2 regular Abdomen:soft non tender, non distended Extremities:no edema Neurology: Non focal Skin: no rash  Pertinent Labs/Radiology:    Latest Ref Rng & Units 01/13/2023    4:15 AM 01/12/2023    4:26 AM 01/09/2023    6:38 AM  CBC  WBC 4.0 - 10.5 K/uL 5.2  4.7  5.6   Hemoglobin 13.0 - 17.0 g/dL 40.1  02.7  25.3   Hematocrit 39.0 - 52.0 % 30.8  31.9  33.8   Platelets 150 - 400 K/uL 210  225  254     Lab Results  Component Value Date   NA 131 (L) 01/13/2023   K 3.9 01/13/2023   CL 100 01/13/2023   CO2 26 01/13/2023      Assessment/Plan: Septic arthritis right knee (ESBL Klebsiella pneumoniae)-with recent history of ORIF on 10/14 with hardware in place S/p I&D 10/30-VAC discontinued 11/4-unfortunately purulence was observed on 11/11 inspite of being on IV antibiotics-underwent repeat irrigation/debridement on 11/13 Cultures from 11/13 pending-but negative so far Remains on IV Invanz  Discussed with ID-11/15 Dr. Remi Deter weeks of IV Invanz from 11/13-possible suppression thereafter with p.o. omadacycline thereafter.  Please reconsult infectious disease when closer to discharge.  Not a candidate for outpatient IV therapies given history of polysubstance abuse. Ortho following-WBAT as tolerated.  Note-patient asked to not change dressing by himself-he is at risk of recurrent infection-have discussed him with at length-and to let nursing staff do it with strict aseptic precautions.  PAF with RVR Rate controlled with oral metoprolol-continue midodrine for BP support Not a candidate for anticoagulation-in any events CHADS2-VASC score of 1  Liver cirrhosis-s/p TIPS  Relatively well compensated Volume status relatively stable Continue lactulose. Diuretics on hold due to  soft BP (on midodrine for BP support)  Polysubstance abuse Acknowledges ongoing meth/THC use and remote cocaine use-although UDS positive for cocaine Ongoing counseling  Obesity: Estimated body mass index is 35.26 kg/m as calculated from the following:   Height as of this encounter: 6' (1.829 m).   Weight as of this encounter: 117.9 kg.   Code status:   Code Status: Full Code   DVT Prophylaxis: enoxaparin (LOVENOX) injection 40 mg Start: 12/30/22 0900 SCDs Start: 12/28/22 0816   Family Communication: None at bedside   Disposition Plan: Status is: Inpatient Remains inpatient appropriate because: Severity of illness   Planned Discharge Destination:Home   Diet: Diet Order             Diet regular Room service appropriate? Yes; Fluid consistency: Thin  Diet effective now                     Antimicrobial agents: Anti-infectives (From admission, onward)    Start     Dose/Rate Route Frequency Ordered Stop   01/12/23 1312  tobramycin (NEBCIN) powder  Status:  Discontinued          As needed 01/12/23 1312 01/12/23 1329   12/31/22 1545  ertapenem (INVANZ) 1 g in sodium chloride 0.9 % 100 mL IVPB        1 g 200 mL/hr over 30 Minutes Intravenous Every 24 hours 12/31/22 1546 02/08/23 1000   12/31/22 1100  ertapenem (INVANZ) 1 g in sodium chloride 0.9 % 100 mL IVPB  Status:  Discontinued        1 g 200 mL/hr over 30 Minutes Intravenous Every 24 hours 12/31/22 0931 12/31/22 1546   12/30/22 1400  cefTRIAXone (ROCEPHIN) 2 g in sodium chloride 0.9 % 100 mL IVPB  Status:  Discontinued        2 g 200 mL/hr over 30 Minutes Intravenous Every 24 hours 12/30/22 1056 12/31/22 0931   12/29/22 1000  tobramycin (NEBCIN) powder  Status:  Discontinued          As needed 12/29/22 1022 12/29/22 1035   12/29/22 1000  vancomycin (VANCOCIN) powder  Status:  Discontinued          As needed 12/29/22 1023 12/29/22 1035   12/28/22 1400  piperacillin-tazobactam (ZOSYN) IVPB 3.375 g  Status:   Discontinued        3.375 g 12.5 mL/hr over 240 Minutes Intravenous Every 8 hours 12/28/22 1202 12/30/22 1056   12/28/22 1300  vancomycin (VANCOCIN) IVPB 1000 mg/200 mL premix  Status:  Discontinued        1,000 mg 200 mL/hr over 60 Minutes Intravenous Every 8 hours 12/28/22 1202 12/31/22 1029   12/28/22 0900  doxycycline (VIBRAMYCIN) 100 mg in dextrose 5 % 250 mL IVPB  Status:  Discontinued        100 mg 125 mL/hr over 120 Minutes Intravenous Every 12 hours 12/28/22 0826 12/28/22 1141   12/28/22 0115  vancomycin (VANCOCIN) IVPB 1000 mg/200 mL premix  Status:  Discontinued        1,000 mg 200 mL/hr over 60 Minutes Intravenous  Once 12/28/22 0107 12/28/22 0108   12/28/22 0115  piperacillin-tazobactam (ZOSYN) IVPB 3.375 g        3.375 g 100 mL/hr over 30 Minutes Intravenous  Once 12/28/22 0107 12/28/22 0142   12/28/22 0115  vancomycin (VANCOREADY) IVPB 2000 mg/400 mL  2,000 mg 200 mL/hr over 120 Minutes Intravenous  Once 12/28/22 0108 12/28/22 0342   12/28/22 0100  doxycycline (VIBRA-TABS) tablet 100 mg        100 mg Oral  Once 12/28/22 0049 12/28/22 0054        MEDICATIONS: Scheduled Meds:  Chlorhexidine Gluconate Cloth  6 each Topical Daily   cholecalciferol  1,000 Units Oral Daily   enoxaparin (LOVENOX) injection  40 mg Subcutaneous Q24H   folic acid  1 mg Oral Daily   lactulose  30 g Oral TID   metoprolol tartrate  50 mg Oral BID   midodrine  10 mg Oral TID WC   polyethylene glycol  17 g Oral Daily   sodium chloride flush  10-40 mL Intracatheter Q12H   sodium chloride flush  3 mL Intravenous Q12H   thiamine  100 mg Oral Daily   Vitamin D (Ergocalciferol)  50,000 Units Oral Q7 days   Continuous Infusions:  ertapenem 1 g (01/15/23 0855)   PRN Meds:.acetaminophen, bisacodyl, diltiazem, HYDROmorphone (DILAUDID) injection, lactulose, methocarbamol, mouth rinse, [DISCONTINUED] oxyCODONE **OR** oxyCODONE, polyethylene glycol, sodium chloride flush, sodium chloride  flush   I have personally reviewed following labs and imaging studies  LABORATORY DATA: CBC: Recent Labs  Lab 01/09/23 0638 01/12/23 0426 01/13/23 0415  WBC 5.6 4.7 5.2  HGB 11.1* 10.7* 10.2*  HCT 33.8* 31.9* 30.8*  MCV 91.1 89.4 89.5  PLT 254 225 210    Basic Metabolic Panel: Recent Labs  Lab 01/09/23 0638 01/12/23 0426 01/13/23 0415  NA 134* 132* 131*  K 4.9 3.9 3.9  CL 100 102 100  CO2 28 26 26   GLUCOSE 89 94 148*  BUN 10 9 21*  CREATININE 0.68 0.63 1.07  CALCIUM 8.9 8.7* 8.8*    GFR: Estimated Creatinine Clearance: 102.2 mL/min (by C-G formula based on SCr of 1.07 mg/dL).  Liver Function Tests: Recent Labs  Lab 01/09/23 0638 01/12/23 0426 01/13/23 0415  AST 21 21 19   ALT 14 14 13   ALKPHOS 117 103 121  BILITOT 0.8 1.3* 0.9  PROT 6.6 6.5 6.5  ALBUMIN 2.0* 2.1* 2.0*   No results for input(s): "LIPASE", "AMYLASE" in the last 168 hours. No results for input(s): "AMMONIA" in the last 168 hours.  Coagulation Profile: No results for input(s): "INR", "PROTIME" in the last 168 hours.  Cardiac Enzymes: No results for input(s): "CKTOTAL", "CKMB", "CKMBINDEX", "TROPONINI" in the last 168 hours.  BNP (last 3 results) No results for input(s): "PROBNP" in the last 8760 hours.  Lipid Profile: No results for input(s): "CHOL", "HDL", "LDLCALC", "TRIG", "CHOLHDL", "LDLDIRECT" in the last 72 hours.  Thyroid Function Tests: No results for input(s): "TSH", "T4TOTAL", "FREET4", "T3FREE", "THYROIDAB" in the last 72 hours.  Anemia Panel: No results for input(s): "VITAMINB12", "FOLATE", "FERRITIN", "TIBC", "IRON", "RETICCTPCT" in the last 72 hours.  Urine analysis:    Component Value Date/Time   COLORURINE AMBER (A) 10/01/2022 0142   APPEARANCEUR CLOUDY (A) 10/01/2022 0142   LABSPEC 1.025 10/01/2022 0142   PHURINE 5.0 10/01/2022 0142   GLUCOSEU NEGATIVE 10/01/2022 0142   HGBUR SMALL (A) 10/01/2022 0142   BILIRUBINUR NEGATIVE 10/01/2022 0142   KETONESUR  NEGATIVE 10/01/2022 0142   PROTEINUR 30 (A) 10/01/2022 0142   NITRITE NEGATIVE 10/01/2022 0142   LEUKOCYTESUR LARGE (A) 10/01/2022 0142    Sepsis Labs: Lactic Acid, Venous    Component Value Date/Time   LATICACIDVEN 1.5 12/12/2022 2311    MICROBIOLOGY: Recent Results (from the past 240 hour(s))  Aerobic/Anaerobic  Culture w Gram Stain (surgical/deep wound)     Status: None (Preliminary result)   Collection Time: 01/12/23  1:13 PM   Specimen: Path Tissue  Result Value Ref Range Status   Specimen Description TISSUE  Final   Special Requests RT KNEE A 1  Final   Gram Stain   Final    RARE WBC PRESENT, PREDOMINANTLY PMN NO ORGANISMS SEEN    Culture   Final    NO GROWTH 2 DAYS NO ANAEROBES ISOLATED; CULTURE IN PROGRESS FOR 5 DAYS Performed at [facility], [address] [place], [place] [address]    Report Status PENDING  Incomplete  Aerobic/Anaerobic Culture w Gram Stain (surgical/deep wound)     Status: None (Preliminary result)   Collection Time: 01/12/23  1:16 PM   Specimen: Path Tissue  Result Value Ref Range Status   Specimen Description TISSUE  Final   Special Requests RT KNEE B 2  Final   Gram Stain   Final    RARE WBC PRESENT, PREDOMINANTLY PMN NO ORGANISMS SEEN    Culture   Final    NO GROWTH 2 DAYS NO ANAEROBES ISOLATED; CULTURE IN PROGRESS FOR 5 DAYS Performed at [facility], [address], [place], [place] [address]    Report Status PENDING  Incomplete     RADIOLOGY STUDIES/RESULTS: No results found.   LOS: 18 days   Jeoffrey Massed, MD  [facility]    To contact the attending provider between 7A-7P or the covering provider during after hours 7P-7A, please log into the web site [email or web address] and access using universal [place] password for that web site. If you do not have the password, please call the hospital operator.  01/15/2023, 10:12 AM

## 2023-01-15 NOTE — Progress Notes (Signed)
Patient found changing his own dressing on right knee. Patient educated to not change his dressing and for nursing staff to do so to help prevent infection. Patient remains non-compliant.

## 2023-01-16 DIAGNOSIS — M00861 Arthritis due to other bacteria, right knee: Secondary | ICD-10-CM | POA: Diagnosis not present

## 2023-01-16 LAB — CBC
HCT: 32.8 % — ABNORMAL LOW (ref 39.0–52.0)
Hemoglobin: 10.3 g/dL — ABNORMAL LOW (ref 13.0–17.0)
MCH: 29.3 pg (ref 26.0–34.0)
MCHC: 31.4 g/dL (ref 30.0–36.0)
MCV: 93.2 fL (ref 80.0–100.0)
Platelets: 188 10*3/uL (ref 150–400)
RBC: 3.52 MIL/uL — ABNORMAL LOW (ref 4.22–5.81)
RDW: 14.6 % (ref 11.5–15.5)
WBC: 4.8 10*3/uL (ref 4.0–10.5)
nRBC: 0 % (ref 0.0–0.2)

## 2023-01-16 LAB — C-REACTIVE PROTEIN: CRP: 0.9 mg/dL (ref <1.0)

## 2023-01-16 LAB — COMPREHENSIVE METABOLIC PANEL
ALT: 19 U/L (ref 0–44)
AST: 27 U/L (ref 15–41)
Albumin: 2.1 g/dL — ABNORMAL LOW (ref 3.5–5.0)
Alkaline Phosphatase: 112 U/L (ref 38–126)
Anion gap: 6 (ref 5–15)
BUN: 13 mg/dL (ref 6–20)
CO2: 26 mmol/L (ref 22–32)
Calcium: 8.5 mg/dL — ABNORMAL LOW (ref 8.9–10.3)
Chloride: 104 mmol/L (ref 98–111)
Creatinine, Ser: 0.68 mg/dL (ref 0.61–1.24)
GFR, Estimated: >60 mL/min (ref >60)
Glucose, Bld: 100 mg/dL — ABNORMAL HIGH (ref 70–99)
Potassium: 4 mmol/L (ref 3.5–5.1)
Sodium: 136 mmol/L (ref 135–145)
Total Bilirubin: 0.8 mg/dL (ref <1.2)
Total Protein: 6 g/dL — ABNORMAL LOW (ref 6.5–8.1)

## 2023-01-16 LAB — AMMONIA: Ammonia: 25 umol/L (ref 9–35)

## 2023-01-16 NOTE — Plan of Care (Signed)
  Problem: Education: Goal: Knowledge of disease or condition will improve Outcome: Progressing Goal: Understanding of medication regimen will improve Outcome: Progressing Goal: Individualized Educational Video(s) Outcome: Progressing   Problem: Activity: Goal: Ability to tolerate increased activity will improve Outcome: Progressing   Problem: Cardiac: Goal: Ability to achieve and maintain adequate cardiopulmonary perfusion will improve Outcome: Progressing   Problem: Health Behavior/Discharge Planning: Goal: Ability to safely manage health-related needs after discharge will improve Outcome: Progressing   Problem: Education: Goal: Knowledge of General Education information will improve Description: Including pain rating scale, medication(s)/side effects and non-pharmacologic comfort measures Outcome: Progressing   Problem: Health Behavior/Discharge Planning: Goal: Ability to manage health-related needs will improve Outcome: Progressing   Problem: Clinical Measurements: Goal: Ability to maintain clinical measurements within normal limits will improve Outcome: Progressing Goal: Will remain free from infection Outcome: Progressing Goal: Diagnostic test results will improve Outcome: Progressing Goal: Respiratory complications will improve Outcome: Progressing Goal: Cardiovascular complication will be avoided Outcome: Progressing   Problem: Activity: Goal: Risk for activity intolerance will decrease Outcome: Progressing   Problem: Nutrition: Goal: Adequate nutrition will be maintained Outcome: Progressing   Problem: Coping: Goal: Level of anxiety will decrease Outcome: Progressing   Problem: Elimination: Goal: Will not experience complications related to bowel motility Outcome: Progressing Goal: Will not experience complications related to urinary retention Outcome: Progressing   Problem: Pain Management: Goal: General experience of comfort will  improve Outcome: Progressing   Problem: Safety: Goal: Ability to remain free from injury will improve Outcome: Progressing   Problem: Skin Integrity: Goal: Risk for impaired skin integrity will decrease Outcome: Progressing

## 2023-01-16 NOTE — Progress Notes (Signed)
PROGRESS NOTE        PATIENT DETAILS Name: Jonathan Ibarra Age: 57 y.o. Sex: male Date of Birth: [birth year] Admit Date: 12/27/2022 Admitting Physician Osvaldo Shipper, MD [id number], [name], MD  Brief Summary: Patient is a 57 y.o.  male with history of liver cirrhosis, HTN, polysubstance abuse-recent hospitalization from 10/13-10/18 following trauma with fall resulting in a right tibia plateau fracture requiring ORIF on 10/14-presented with purulent discharge from his right knee-he was thought to have septic arthritis and subsequently admitted to the hospitalist service.  Significant events: 10/13-10/18 >> hospitalization-right tibial plateau fracture following a fall-s/p ORIF.   10/28>> purulent discharge from right knee-septic arthritis-admit to [facility] 10/30>> irrigation/debridement of right knee 11/11>> found to have purulence from right knee 11/13>> repeat irrigation/debridement  Significant studies: 08/02>> echo: EF 55-60% 10/28>> x-ray right knee: Unchanged lateral plate/screw fixation across comminuted fracture involving medial/lateral tibial plateaus, moderate knee joint effusion 10/29>> CXR: No active disease  Significant microbiology data: 10/29>> blood culture: 1/2-Corynebacterium (contamination) 10/30>> right knee abscess culture: Klebsiella pneumoniae-ESBL 10/31>> blood culture: No growth 11/13>> right knee abscess culture: No growth  Procedures: 10/30>> irrigation and debridement of right knee 11/13>> repeat irrigation/debridement.  Consults: Ortho  Infectious disease  Subjective:   Patient in bed, appears comfortable, denies any headache, no fever, no chest pain or pressure, no shortness of breath , no abdominal pain. No new focal weakness.   Objective: Vitals: Blood pressure 108/69, pulse 80, temperature 98.2 F (36.8 C), temperature source Oral, resp. rate 18, height 6' (1.829 m), weight 117.9 kg, SpO2 99%.   Exam:  Awake  Alert, No new F.N deficits, Normal affect [place].AT,PERRAL Supple Neck, No JVD,   Symmetrical Chest wall movement, Good air movement bilaterally, CTAB RRR,No Gallops, Rubs or new Murmurs,  +ve B.Sounds, Abd Soft, No tenderness,   Right leg under bandage   Assessment/Plan: Septic arthritis right knee (ESBL Klebsiella pneumoniae)-with recent history of ORIF on 10/14 with hardware in place S/p I&D 10/30-VAC discontinued 11/4-unfortunately purulence was observed on 11/11 inspite of being on IV antibiotics-underwent repeat irrigation/debridement on 11/13 Cultures from 11/13 pending-but negative so far Remains on IV Invanz  Discussed with ID-11/15 Dr. Remi Deter weeks of IV Invanz from 11/13-possible suppression thereafter with p.o. omadacycline thereafter.  Please reconsult infectious disease when closer to discharge.  Not a candidate for outpatient IV therapies given history of polysubstance abuse. Ortho following-WBAT as tolerated.  Note-patient asked to not change dressing by himself-he is at risk of recurrent infection-have discussed him with at length-and to let nursing staff do it with strict aseptic precautions.   PAF with RVR Rate controlled with oral metoprolol-continue midodrine for BP support Not a candidate for anticoagulation-in any events CHADS2-VASC score of 1  Liver cirrhosis-s/p TIPS Relatively well compensated Volume status relatively stable Continue lactulose. Diuretics on hold due to soft BP (on midodrine for BP support)  Polysubstance abuse Acknowledges ongoing meth/THC use and remote cocaine use-although UDS positive for cocaine Ongoing counseling  Obesity: Estimated body mass index is 35.26 kg/m as calculated from the following:   Height as of this encounter: 6' (1.829 m).   Weight as of this encounter: 117.9 kg.   Code status:   Code Status: Full Code   DVT Prophylaxis: enoxaparin (LOVENOX) injection 40 mg Start: 12/30/22 0900 SCDs Start: 12/28/22 0816    Family Communication: None at bedside  Disposition Plan: Status is: Inpatient Remains inpatient appropriate because: Severity of illness   Planned Discharge Destination:Home   Diet: Diet Order             Diet regular Room service appropriate? Yes; Fluid consistency: Thin  Diet effective now                    MEDICATIONS: Scheduled Meds:  Chlorhexidine Gluconate Cloth  6 each Topical Daily   cholecalciferol  1,000 Units Oral Daily   enoxaparin (LOVENOX) injection  40 mg Subcutaneous Q24H   folic acid  1 mg Oral Daily   lactulose  30 g Oral TID   metoprolol tartrate  50 mg Oral BID   midodrine  10 mg Oral TID WC   polyethylene glycol  17 g Oral Daily   sodium chloride flush  10-40 mL Intracatheter Q12H   sodium chloride flush  3 mL Intravenous Q12H   thiamine  100 mg Oral Daily   Vitamin D (Ergocalciferol)  50,000 Units Oral Q7 days   Continuous Infusions:  ertapenem 1 g (01/16/23 0824)   PRN Meds:.acetaminophen, bisacodyl, diltiazem, HYDROmorphone (DILAUDID) injection, lactulose, methocarbamol, mouth rinse, [DISCONTINUED] oxyCODONE **OR** oxyCODONE, polyethylene glycol, sodium chloride flush, sodium chloride flush   I have personally reviewed following labs and imaging studies  LABORATORY DATA:  Recent Labs  Lab 01/12/23 0426 01/13/23 0415 01/16/23 0454  WBC 4.7 5.2 4.8  HGB 10.7* 10.2* 10.3*  HCT 31.9* 30.8* 32.8*  PLT 225 210 188  MCV 89.4 89.5 93.2  MCH 30.0 29.7 29.3  MCHC 33.5 33.1 31.4  RDW 14.2 13.8 14.6    Recent Labs  Lab 01/12/23 0426 01/13/23 0415 01/16/23 0454  NA 132* 131* 136  K 3.9 3.9 4.0  CL 102 100 104  CO2 26 26 26   ANIONGAP 4* 5 6  GLUCOSE 94 148* 100*  BUN 9 21* 13  CREATININE 0.63 1.07 0.68  AST 21 19 27   ALT 14 13 19   ALKPHOS 103 121 112  BILITOT 1.3* 0.9 0.8  ALBUMIN 2.1* 2.0* 2.1*  CRP  --   --  0.9  AMMONIA  --   --  25  CALCIUM 8.7* 8.8* 8.5*    No results found for: "CHOL", "HDL", "LDLCALC",  "LDLDIRECT", "TRIG", "CHOLHDL"    Recent Labs  Lab 01/12/23 0426 01/13/23 0415 01/16/23 0454  CRP  --   --  0.9  AMMONIA  --   --  25  CALCIUM 8.7* 8.8* 8.5*       MICROBIOLOGY: Recent Results (from the past 240 hour(s))  Aerobic/Anaerobic Culture w Gram Stain (surgical/deep wound)     Status: None (Preliminary result)   Collection Time: 01/12/23  1:13 PM   Specimen: Path Tissue  Result Value Ref Range Status   Specimen Description TISSUE  Final   Special Requests RT KNEE A 1  Final   Gram Stain   Final    RARE WBC PRESENT, PREDOMINANTLY PMN NO ORGANISMS SEEN    Culture   Final    NO GROWTH 3 DAYS NO ANAEROBES ISOLATED; CULTURE IN PROGRESS FOR 5 DAYS Performed at [facility], [address], [place], [place] [address]    Report Status PENDING  Incomplete  Aerobic/Anaerobic Culture w Gram Stain (surgical/deep wound)     Status: None (Preliminary result)   Collection Time: 01/12/23  1:16 PM   Specimen: Path Tissue  Result Value Ref Range Status   Specimen Description TISSUE  Final  Special Requests RT KNEE B 2  Final   Gram Stain   Final    RARE WBC PRESENT, PREDOMINANTLY PMN NO ORGANISMS SEEN    Culture   Final    NO GROWTH 3 DAYS NO ANAEROBES ISOLATED; CULTURE IN PROGRESS FOR 5 DAYS Performed at [facility], [address] [place], [place] [address]    Report Status PENDING  Incomplete     RADIOLOGY STUDIES/RESULTS: No results found.   LOS: 19 days   Signature  -    Susa Raring M.D on 01/16/2023 at 10:28 AM   -  To page go to [email or web address]

## 2023-01-17 DIAGNOSIS — M00861 Arthritis due to other bacteria, right knee: Secondary | ICD-10-CM | POA: Diagnosis not present

## 2023-01-17 LAB — AEROBIC/ANAEROBIC CULTURE W GRAM STAIN (SURGICAL/DEEP WOUND)
Culture: NO GROWTH
Culture: NO GROWTH

## 2023-01-17 NOTE — Plan of Care (Signed)
Pt has rested quietly throughout the night with no distress noted. Alert and oriented but forgetful at times. On room air. Midline intact to RUA with dressing CDI. Voids per urinal. Up to bR with walker and assist. Bed alarm on. Medicated twice for pain with relief noted. Dressing to right knee CDI with ace wrap. No other complaints voiced.     Problem: Education: Goal: Knowledge of disease or condition will improve Outcome: Progressing Goal: Understanding of medication regimen will improve Outcome: Progressing Goal: Individualized Educational Video(s) Outcome: Progressing   Problem: Health Behavior/Discharge Planning: Goal: Ability to safely manage health-related needs after discharge will improve Outcome: Progressing   Problem: Education: Goal: Knowledge of General Education information will improve Description: Including pain rating scale, medication(s)/side effects and non-pharmacologic comfort measures Outcome: Progressing   Problem: Health Behavior/Discharge Planning: Goal: Ability to manage health-related needs will improve Outcome: Progressing   Problem: Clinical Measurements: Goal: Ability to maintain clinical measurements within normal limits will improve Outcome: Progressing   Problem: Clinical Measurements: Goal: Diagnostic test results will improve Outcome: Progressing   Problem: Clinical Measurements: Goal: Respiratory complications will improve Outcome: Progressing   Problem: Coping: Goal: Level of anxiety will decrease Outcome: Progressing   Problem: Elimination: Goal: Will not experience complications related to bowel motility Outcome: Progressing   Problem: Pain Management: Goal: General experience of comfort will improve Outcome: Progressing   Problem: Safety: Goal: Ability to remain free from injury will improve Outcome: Progressing   Problem: Skin Integrity: Goal: Risk for impaired skin integrity will decrease Outcome: Progressing

## 2023-01-17 NOTE — Progress Notes (Signed)
Physical Therapy Treatment Patient Details Name: Jonathan Ibarra MRN: [id number] DOB: August 08, 1965 Today's Date: 01/17/2023   History of Present Illness Pt is a 57 y/o male presenting 10/28 to [facility] with severe right knee pain and drainage after a fall the night PTA, he is now s/p R knee I&D on 12/29/22. He recently had a R tibial ORIF on 12/13/22 after a falls incidence. VOZ:DGUYQIH abuse, [name], HTN, liver cirrhosis, polysubstance abuse.    PT Comments  Pt tolerated treatment well today. Pt able to progress ambulation in hallway today with good adherence to WB precautions. No change in DC/DME recs at this time. PT will continue to follow.     If plan is discharge home, [name];[name];Assistance with cooking/housework;Assist for transportation;Help with stairs or ramp for entrance   Can travel by private vehicle        Equipment Recommendations  None recommended by PT    Recommendations for Other Services       Precautions / Restrictions Precautions Precautions: Fall Restrictions Weight Bearing Restrictions: Yes RLE Weight Bearing: Touchdown weight bearing     Mobility  Bed Mobility Overal bed mobility: Needs Assistance Bed Mobility: Supine to Sit     Supine to sit: [name], Supervision     General bed mobility comments: Pt left sitting EOB with RN    Transfers Overall transfer level: Needs assistance Equipment used: Rolling walker (2 wheels) Transfers: Sit to/from Stand Sit to Stand: Contact guard assist           General transfer comment: Good hand placement.    Ambulation/Gait Ambulation/Gait assistance: Contact guard assist, +2 safety/equipment Gait Distance (Feet): 200 Feet Assistive device: Rolling walker (2 wheels) Gait Pattern/deviations: Decreased stride length, Decreased stance time - right, Step-to pattern, Trunk flexed Gait velocity:  decreased     General Gait Details: Pt able to maintain TDWB. 1 standing rest break   Stairs             Wheelchair Mobility     Tilt Bed    Modified Rankin (Stroke Patients Only)       Balance Overall balance assessment: Needs assistance Sitting-balance support: No upper extremity supported, Feet supported Sitting balance-Leahy Scale: Good Sitting balance - Comments: sitting EOB   Standing balance support: Bilateral upper extremity supported Standing balance-Leahy Scale: Fair Standing balance comment: reliant on RW support                            Cognition Arousal: Alert Behavior During Therapy: WFL for tasks assessed/performed Overall Cognitive Status: Within Functional Limits for tasks assessed                                 General Comments: Less impulsive today however safety is still questionable at best.        Exercises      General Comments General comments (skin integrity, edema, etc.): VSS      Pertinent Vitals/Pain Pain Assessment Pain Assessment: Faces Faces Pain Scale: Hurts even more Pain Location: R knee Pain Descriptors / Indicators: Discomfort, Aching, Moaning Pain Intervention(s): Monitored during session, Repositioned    Home Living                          Prior Function  PT Goals (current goals can now be found in the care plan section) Progress towards PT goals: Progressing toward goals    Frequency    Min 1X/week      PT Plan      Co-evaluation              AM-PAC PT "6 Clicks" Mobility   Outcome Measure  Help needed turning from your back to your side while in a flat bed without using bedrails?: None Help needed moving from lying on your back to sitting on the side of a flat bed without using bedrails?: A Little Help needed moving to and from a bed to a chair (including a wheelchair)?: A Little Help needed standing up from a chair using your arms  (e.g., wheelchair or bedside chair)?: A Little Help needed to walk in hospital room?: A Little Help needed climbing 3-5 steps with a railing? : Total 6 Click Score: 17    End of Session Equipment Utilized During Treatment: Gait belt Activity Tolerance: Patient tolerated treatment well Patient left: in bed;with call bell/phone within reach;with nursing/sitter in room Nurse Communication: Mobility status PT Visit Diagnosis: Muscle weakness (generalized) (M62.81);Other abnormalities of gait and mobility (R26.89)     Time: 1610-9604 PT Time Calculation (min) (ACUTE ONLY): 27 min  Charges:    $Gait Training: 23-37 mins PT General Charges $$ ACUTE PT VISIT: 1 Visit                     Shela Nevin, PT, DPT Acute Rehab Services [phone]    Gladys Damme 01/17/2023, 3:10 PM

## 2023-01-17 NOTE — Progress Notes (Signed)
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  Sensitivities Klebsiella sensitive to minocycline - Taken to the OR on 10/30 with cultures growing ESBL Kaleab cellular - Repeat I&D on 11/13 due to purulence at incision site on 11/11.  Or findings showed purulent and fibrinous material around hardware.  Or cultures no growth. Plan: - Continue ertapenem,  reset the clock from last I&D on 11/13( 4-6 weeks) - Plan on suppressive omadacycline - Monitor clinically Microbiology:   Antibiotics: Ertapenem Other   SUBJECTIVE: Resting in bed.  No new complaints. Interval: Afebrile overnight.   Review of Systems: Review of Systems  All other systems reviewed and are negative.    Scheduled Meds:  Chlorhexidine Gluconate Cloth  6 each Topical Daily   cholecalciferol  1,000 Units Oral Daily   enoxaparin (LOVENOX) injection  40 mg Subcutaneous Q24H   folic acid  1 mg Oral Daily   lactulose  30 g Oral TID   metoprolol tartrate  50 mg Oral BID   midodrine  10 mg Oral TID WC   polyethylene glycol  17 g Oral Daily   sodium chloride flush  10-40 mL Intracatheter Q12H   sodium chloride flush  3 mL Intravenous Q12H   thiamine  100 mg Oral Daily   Vitamin D (Ergocalciferol)  50,000 Units Oral Q7 days   Continuous Infusions:  ertapenem 1 g (01/17/23 0916)   PRN Meds:.acetaminophen, bisacodyl, diltiazem,  HYDROmorphone (DILAUDID) injection, lactulose, methocarbamol, mouth rinse, [DISCONTINUED] oxyCODONE **OR** oxyCODONE, polyethylene glycol, sodium chloride flush, sodium chloride flush Allergies  Allergen Reactions   Tylenol [Acetaminophen] Other (See Comments)    Impacts liver    OBJECTIVE: Vitals:   01/16/23 1934 01/16/23 2300 01/17/23 0316 01/17/23 0910  BP: 101/71 (!) 85/46 (!) 87/52 115/81  Pulse: 97 70 73 75  Resp: 19 19 20    Temp: 98 F (36.7 C) 98.2 F (36.8 C) 98.1 F (36.7 C)   TempSrc: Oral Oral Oral   SpO2:    100%  Weight:      Height:       Body mass index is 35.26 kg/m.  Physical Exam Constitutional:      General: He is not in acute distress.    Appearance: He is normal weight. He is not toxic-appearing.  HENT:     Head: Normocephalic and atraumatic.     Right Ear: External ear normal.     Left Ear: External ear normal.     Nose: No congestion or rhinorrhea.     Mouth/Throat:     Mouth: Mucous membranes are moist.     Pharynx: Oropharynx is clear.  Eyes:     Extraocular Movements: Extraocular movements intact.     Conjunctiva/sclera: Conjunctivae normal.     Pupils: Pupils are equal, round, and reactive to light.  Cardiovascular:     Rate and Rhythm: Normal rate and regular rhythm.     Heart sounds:  No murmur heard.    No friction rub. No gallop.  Pulmonary:     Effort: Pulmonary effort is normal.     Breath sounds: Normal breath sounds.  Abdominal:     General: Abdomen is flat. Bowel sounds are normal.     Palpations: Abdomen is soft.  Musculoskeletal:        General: No swelling.     Cervical back: Normal range of motion and neck supple.     Comments: Right leg wound  Skin:    General: Skin is warm and dry.  Neurological:     General: No focal deficit present.     Mental Status: He is oriented to person, place, and time.  Psychiatric:        Mood and Affect: Mood normal.       Lab Results Lab Results  Component Value Date   WBC 4.8  01/16/2023   HGB 10.3 (L) 01/16/2023   HCT 32.8 (L) 01/16/2023   MCV 93.2 01/16/2023   PLT 188 01/16/2023    Lab Results  Component Value Date   CREATININE 0.68 01/16/2023   BUN 13 01/16/2023   NA 136 01/16/2023   K 4.0 01/16/2023   CL 104 01/16/2023   CO2 26 01/16/2023    Lab Results  Component Value Date   ALT 19 01/16/2023   AST 27 01/16/2023   ALKPHOS 112 01/16/2023   BILITOT 0.8 01/16/2023        Danelle Earthly, MD [facility] 01/17/2023, 3:31 PM  I have personally spent 52 minutes involved in face-to-face and non-face-to-face activities for this patient on the day of the visit. Professional time spent includes the following activities: Preparing to see the patient (review of tests), Obtaining and/or reviewing separately obtained history (admission/discharge record), Performing a medically appropriate examination and/or evaluation , Ordering medications/tests/procedures, referring and communicating with other health care professionals, Documenting clinical information in the EMR, Independently interpreting results (not separately reported), Communicating results to the patient/family/caregiver, Counseling and educating the patient/family/caregiver and Care coordination (not separately reported).

## 2023-01-17 NOTE — Progress Notes (Signed)
PROGRESS NOTE        PATIENT DETAILS Name: Jonathan Ibarra Age: 57 y.o. Sex: male Date of Birth: December 14, 1965 Admit Date: 12/27/2022 Admitting Physician Osvaldo Shipper, MD ZOX:[id number], [name], MD  Brief Summary: Patient is a 57 y.o.  male with history of liver cirrhosis, HTN, polysubstance abuse-recent hospitalization from 10/13-10/18 following trauma with fall resulting in a right tibia plateau fracture requiring ORIF on 10/14-presented with purulent discharge from his right knee-he was thought to have septic arthritis and subsequently admitted to the hospitalist service.  Significant events: 10/13-10/18 >> hospitalization-right tibial plateau fracture following a fall-s/p ORIF.   10/28>> purulent discharge from right knee-septic arthritis-admit to [facility] 10/30>> irrigation/debridement of right knee 11/11>> found to have purulence from right knee 11/13>> repeat irrigation/debridement  Significant studies: 08/02>> echo: EF 55-60% 10/28>> x-ray right knee: Unchanged lateral plate/screw fixation across comminuted fracture involving medial/lateral tibial plateaus, moderate knee joint effusion 10/29>> CXR: No active disease  Significant microbiology data: 10/29>> blood culture: 1/2-Corynebacterium (contamination) 10/30>> right knee abscess culture: Klebsiella pneumoniae-ESBL 10/31>> blood culture: No growth 11/13>> right knee abscess culture: No growth  Procedures: 10/30>> irrigation and debridement of right knee 11/13>> repeat irrigation/debridement.  Consults: Ortho  Infectious disease  Subjective:   Patient in bed, appears comfortable, denies any headache, no fever, no chest pain or pressure, no shortness of breath , no abdominal pain. No new focal weakness.  Objective: Vitals: Blood pressure 115/81, pulse 75, temperature 98.1 F (36.7 C), temperature source Oral, resp. rate 20, height 6' (1.829 m), weight 117.9 kg, SpO2 100%.   Exam:  Awake  Alert, No new F.N deficits, Normal affect [place].AT,PERRAL Supple Neck, No JVD,   Symmetrical Chest wall movement, Good air movement bilaterally, CTAB RRR,No Gallops, Rubs or new Murmurs,  +ve B.Sounds, Abd Soft, No tenderness,   Right leg under bandage   Assessment/Plan: Septic arthritis right knee (ESBL Klebsiella pneumoniae)-with recent history of ORIF on 10/14 with hardware in place S/p I&D 10/30-VAC discontinued 11/4-unfortunately purulence was observed on 11/11 inspite of being on IV antibiotics-underwent repeat irrigation/debridement on 11/13 Cultures from 11/13 pending-but negative so far Remains on IV Invanz  Discussed with ID-11/15 Dr. Remi Deter weeks of IV Invanz from 11/13-possible suppression thereafter with PO Omadacycline thereafter.  Please reconsult infectious disease when closer to discharge.  Not a candidate for outpatient IV therapies given history of polysubstance abuse. Ortho following-WBAT as tolerated.  Note-patient asked to not change dressing by himself-he is at risk of recurrent infection-have discussed him with at length-and to let nursing staff do it with strict aseptic precautions.   PAF with RVR Rate controlled with oral metoprolol-continue midodrine for BP support Not a candidate for anticoagulation-in any events CHADS2-VASC score of 1  Liver cirrhosis-s/p TIPS Relatively well compensated Volume status relatively stable Continue lactulose. Diuretics on hold due to soft BP (on midodrine for BP support)  Polysubstance abuse Acknowledges ongoing meth/THC use and remote cocaine use-although UDS positive for cocaine Ongoing counseling  Obesity: Estimated body mass index is 35.26 kg/m as calculated from the following:   Height as of this encounter: 6' (1.829 m).   Weight as of this encounter: 117.9 kg.   Code status:   Code Status: Full Code   DVT Prophylaxis: enoxaparin (LOVENOX) injection 40 mg Start: 12/30/22 0900 SCDs Start: 12/28/22 0816    Family Communication: None at bedside   Disposition  Plan: Status is: Inpatient Remains inpatient appropriate because: Severity of illness   Planned Discharge Destination:Home   Diet: Diet Order             Diet regular Room service appropriate? Yes; Fluid consistency: Thin  Diet effective now                    MEDICATIONS: Scheduled Meds:  Chlorhexidine Gluconate Cloth  6 each Topical Daily   cholecalciferol  1,000 Units Oral Daily   enoxaparin (LOVENOX) injection  40 mg Subcutaneous Q24H   folic acid  1 mg Oral Daily   lactulose  30 g Oral TID   metoprolol tartrate  50 mg Oral BID   midodrine  10 mg Oral TID WC   polyethylene glycol  17 g Oral Daily   sodium chloride flush  10-40 mL Intracatheter Q12H   sodium chloride flush  3 mL Intravenous Q12H   thiamine  100 mg Oral Daily   Vitamin D (Ergocalciferol)  50,000 Units Oral Q7 days   Continuous Infusions:  ertapenem 1 g (01/17/23 0916)   PRN Meds:.acetaminophen, bisacodyl, diltiazem, HYDROmorphone (DILAUDID) injection, lactulose, methocarbamol, mouth rinse, [DISCONTINUED] oxyCODONE **OR** oxyCODONE, polyethylene glycol, sodium chloride flush, sodium chloride flush   I have personally reviewed following labs and imaging studies  LABORATORY DATA:  Recent Labs  Lab 01/12/23 0426 01/13/23 0415 01/16/23 0454  WBC 4.7 5.2 4.8  HGB 10.7* 10.2* 10.3*  HCT 31.9* 30.8* 32.8*  PLT 225 210 188  MCV 89.4 89.5 93.2  MCH 30.0 29.7 29.3  MCHC 33.5 33.1 31.4  RDW 14.2 13.8 14.6    Recent Labs  Lab 01/12/23 0426 01/13/23 0415 01/16/23 0454  NA 132* 131* 136  K 3.9 3.9 4.0  CL 102 100 104  CO2 26 26 26   ANIONGAP 4* 5 6  GLUCOSE 94 148* 100*  BUN 9 21* 13  CREATININE 0.63 1.07 0.68  AST 21 19 27   ALT 14 13 19   ALKPHOS 103 121 112  BILITOT 1.3* 0.9 0.8  ALBUMIN 2.1* 2.0* 2.1*  CRP  --   --  0.9  AMMONIA  --   --  25  CALCIUM 8.7* 8.8* 8.5*    No results found for: "CHOL", "HDL", "LDLCALC",  "LDLDIRECT", "TRIG", "CHOLHDL"    Recent Labs  Lab 01/12/23 0426 01/13/23 0415 01/16/23 0454  CRP  --   --  0.9  AMMONIA  --   --  25  CALCIUM 8.7* 8.8* 8.5*       MICROBIOLOGY: Recent Results (from the past 240 hour(s))  Aerobic/Anaerobic Culture w Gram Stain (surgical/deep wound)     Status: None (Preliminary result)   Collection Time: 01/12/23  1:13 PM   Specimen: Path Tissue  Result Value Ref Range Status   Specimen Description TISSUE  Final   Special Requests RT KNEE A 1  Final   Gram Stain   Final    RARE WBC PRESENT, PREDOMINANTLY PMN NO ORGANISMS SEEN    Culture   Final    NO GROWTH 4 DAYS NO ANAEROBES ISOLATED; CULTURE IN PROGRESS FOR 5 DAYS Performed at [facility], [address] [place], [place] [address]    Report Status PENDING  Incomplete  Aerobic/Anaerobic Culture w Gram Stain (surgical/deep wound)     Status: None (Preliminary result)   Collection Time: 01/12/23  1:16 PM   Specimen: Path Tissue  Result Value Ref Range Status   Specimen Description TISSUE  Final  Special Requests RT KNEE B 2  Final   Gram Stain   Final    RARE WBC PRESENT, PREDOMINANTLY PMN NO ORGANISMS SEEN    Culture   Final    NO GROWTH 4 DAYS NO ANAEROBES ISOLATED; CULTURE IN PROGRESS FOR 5 DAYS Performed at [facility], [address], [place], [place] [address]    Report Status PENDING  Incomplete     RADIOLOGY STUDIES/RESULTS: No results found.   LOS: 20 days   Signature  -    Susa Raring M.D on 01/17/2023 at 9:40 AM   -  To page go to [email or web address]

## 2023-01-18 DIAGNOSIS — M00861 Arthritis due to other bacteria, right knee: Secondary | ICD-10-CM | POA: Diagnosis not present

## 2023-01-18 NOTE — Progress Notes (Signed)
Occupational Therapy Treatment Patient Details Name: Jonathan Ibarra MRN: [id number] DOB: [birth year] Today's Date: 01/18/2023   History of present illness Pt is a 57 y/o male presenting 10/28 to [facility] with severe right knee pain and drainage after a fall the night PTA, he is now s/p R knee I&D on 12/29/22. He recently had a R tibial ORIF on 12/13/22 after a falls incidence. NWG:NFAOZHY abuse, [name], HTN, liver cirrhosis, polysubstance abuse.   OT comments  Pt progressing towards goals this session, needing CGA for toilet transfer, adheres well to TWB precautions during session. Pt needing supervision for bed mobility and CGA for ADLs. Pt able to perform seated UE therex, issued blue theraband and pt abel to demo UE exercises. Pt presenting with impairments listed below, will follow acutely. Continue to recommend HHOT at d/c.       If plan is discharge home, recommend the following:  Assistance with cooking/housework;Help with stairs or ramp for entrance;Assist for transportation;[name];A little help with walking and/or transfers   Equipment Recommendations  Tub/shower bench    Recommendations for Other Services      Precautions / Restrictions Precautions Precautions: Fall Restrictions Weight Bearing Restrictions: Yes RLE Weight Bearing: Touchdown weight bearing       Mobility Bed Mobility Overal bed mobility: Needs Assistance Bed Mobility: Supine to Sit, Sit to Supine     Supine to sit: Supervision Sit to supine: Supervision        Transfers Overall transfer level: Needs assistance Equipment used: Rolling walker (2 wheels) Transfers: Sit to/from Stand Sit to Stand: Contact guard assist                 Balance Overall balance assessment: Needs assistance Sitting-balance support: No upper extremity supported, Feet supported Sitting balance-Leahy Scale: Good Sitting balance - Comments: sitting EOB   Standing balance  support: Bilateral upper extremity supported Standing balance-Leahy Scale: Fair Standing balance comment: reliant on RW support                           ADL either performed or assessed with clinical judgement   ADL Overall ADL's : Needs assistance/impaired                         Toilet Transfer: Contact guard assist;Ambulation;Regular Toilet;Rolling walker (2 wheels) Toilet Transfer Details (indicate cue type and reason): with BSC over toilet         Functional mobility during ADLs: Contact guard assist;Rolling walker (2 wheels)      Extremity/Trunk Assessment Upper Extremity Assessment Upper Extremity Assessment: Overall WFL for tasks assessed   Lower Extremity Assessment Lower Extremity Assessment: Defer to PT evaluation        Vision   Vision Assessment?: No apparent visual deficits;Wears glasses for reading   Perception     Praxis      Cognition Arousal: Alert Behavior During Therapy: WFL for tasks assessed/performed Overall Cognitive Status: Within Functional Limits for tasks assessed                                 General Comments: mild agitation with waiting on staff to wrap his leg and IV to shower        Exercises Exercises: Other exercises Other Exercises Other Exercises: forward flexion x10 Other Exercises: elbow flex/ext x10 Other Exercises: shoulder horizontal adduction/abd x10  Shoulder Instructions       General Comments VSS    Pertinent Vitals/ Pain       Pain Assessment Pain Assessment: No/denies pain  Home Living                                          Prior Functioning/Environment              Frequency  Min 1X/week        Progress Toward Goals  OT Goals(current goals can now be found in the care plan section)  Progress towards OT goals: Progressing toward goals  Acute Rehab OT Goals Patient Stated Goal: to get his leg/IV wrapped to shower OT Goal  Formulation: With patient Time For Goal Achievement: 01/26/23 Potential to Achieve Goals: Good ADL Goals Pt Will Perform Grooming: with modified independence;sitting Pt Will Perform Lower Body Bathing: with modified independence;sitting/lateral leans Pt Will Perform Lower Body Dressing: with modified independence;sitting/lateral leans Pt Will Transfer to Toilet: ambulating;with supervision;bedside commode Pt Will Perform Toileting - Clothing Manipulation and hygiene: sit to/from stand;with modified independence;sitting/lateral leans Pt Will Perform Tub/Shower Transfer: with modified independence;Tub transfer;tub bench;rolling walker Pt/caregiver will Perform Home Exercise Program: Increased strength;With theraband;With written HEP provided  Plan      Co-evaluation                 AM-PAC OT "6 Clicks" Daily Activity     Outcome Measure   Help from another person eating meals?: None Help from another person taking care of personal grooming?: A Little Help from another person toileting, which includes using toliet, bedpan, or urinal?: A Little Help from another person bathing (including washing, rinsing, drying)?: A Little Help from another person to put on and taking off regular upper body clothing?: A Little Help from another person to put on and taking off regular lower body clothing?: A Lot 6 Click Score: 18    End of Session Equipment Utilized During Treatment: Rolling walker (2 wheels)  OT Visit Diagnosis: Pain;Unsteadiness on feet (R26.81);Other abnormalities of gait and mobility (R26.89);History of falling (Z91.81) Pain - Right/Left: Right Pain - part of body: Knee   Activity Tolerance Patient tolerated treatment well   Patient Left in bed;with call bell/phone within reach;with bed alarm set   Nurse Communication Mobility status        Time: 4098-1191 OT Time Calculation (min): 23 min  Charges: OT General Charges $OT Visit: 1 Visit OT Treatments $Self  Care/Home Management : 8-22 mins $Therapeutic Activity: 8-22 mins  Carver Fila, OTD, OTR/L SecureChat Preferred Acute Rehab [phone]   Carver Fila Koonce 01/18/2023, 2:45 PM

## 2023-01-18 NOTE — Progress Notes (Signed)
PROGRESS NOTE        PATIENT DETAILS Name: Jonathan Ibarra Age: 57 y.o. Sex: male Date of Birth: 20-Mar-1965 Admit Date: 12/27/2022 Admitting Physician Osvaldo Shipper, MD IOE:VOJJK, [name], MD  Brief Summary: Patient is a 57 y.o.  male with history of liver cirrhosis, HTN, polysubstance abuse-recent hospitalization from 10/13-10/18 following trauma with fall resulting in a right tibia plateau fracture requiring ORIF on 10/14-presented with purulent discharge from his right knee-he was thought to have septic arthritis and subsequently admitted to the hospitalist service.  Significant events: 10/13-10/18 >> hospitalization-right tibial plateau fracture following a fall-s/p ORIF.   10/28>> purulent discharge from right knee-septic arthritis-admit to [facility] 10/30>> irrigation/debridement of right knee 11/11>> found to have purulence from right knee 11/13>> repeat irrigation/debridement  Significant studies: 08/02>> echo: EF 55-60% 10/28>> x-ray right knee: Unchanged lateral plate/screw fixation across comminuted fracture involving medial/lateral tibial plateaus, moderate knee joint effusion 10/29>> CXR: No active disease  Significant microbiology data: 10/29>> blood culture: 1/2-Corynebacterium (contamination) 10/30>> right knee abscess culture: Klebsiella pneumoniae-ESBL 10/31>> blood culture: No growth 11/13>> right knee abscess culture: No growth  Procedures: 10/30>> irrigation and debridement of right knee 11/13>> repeat irrigation/debridement.  Consults: Ortho  Infectious disease  Subjective:   Patient in bed, appears comfortable, denies any headache, no fever, no chest pain or pressure, no shortness of breath , no abdominal pain. No new focal weakness.   Objective: Vitals: Blood pressure 100/62, pulse 82, temperature 97.7 F (36.5 C), temperature source Oral, resp. rate 18, height 6' (1.829 m), weight 117.9 kg, SpO2 96%.   Exam:  Awake  Alert, No new F.N deficits, Normal affect [place].AT,PERRAL Supple Neck, No JVD,   Symmetrical Chest wall movement, Good air movement bilaterally, CTAB RRR,No Gallops, Rubs or new Murmurs,  +ve B.Sounds, Abd Soft, No tenderness,   Right leg under bandage    Assessment/Plan:  Septic arthritis right knee (ESBL Klebsiella pneumoniae)-with recent history of ORIF on 10/14 with hardware in place S/p I&D 10/30-VAC discontinued 11/4-unfortunately purulence was observed on 11/11 inspite of being on IV antibiotics-underwent repeat irrigation/debridement on 11/13 Cultures from 11/13 pending-but negative so far Remains on IV Invanz  Discussed with ID-11/15 Dr. Remi Deter weeks of IV Invanz from 11/13-possible suppression thereafter with PO Omadacycline thereafter.  Please reconsult infectious disease when closer to discharge.  Not a candidate for outpatient IV therapies given history of polysubstance abuse. Ortho following-WBAT as tolerated.  Note-patient asked to not change dressing by himself-he is at risk of recurrent infection-have discussed him with at length-and to let nursing staff do it with strict aseptic precautions.    PAF with RVR Rate controlled with oral metoprolol-continue midodrine for BP support Not a candidate for anticoagulation-in any events CHADS2-VASC score of 1  Liver cirrhosis-s/p TIPS Relatively well compensated Volume status relatively stable Continue lactulose. Diuretics on hold due to soft BP (on midodrine for BP support)  Polysubstance abuse Acknowledges ongoing meth/THC use and remote cocaine use-although UDS positive for cocaine Ongoing counseling  Obesity: Estimated body mass index is 35.26 kg/m as calculated from the following:   Height as of this encounter: 6' (1.829 m).   Weight as of this encounter: 117.9 kg.   Code status:   Code Status: Full Code   DVT Prophylaxis: enoxaparin (LOVENOX) injection 40 mg Start: 12/30/22 0900 SCDs Start: 12/28/22  0816   Family Communication: None at  bedside   Disposition Plan: Status is: Inpatient Remains inpatient appropriate because: Severity of illness   Planned Discharge Destination:Home   Diet: Diet Order             Diet regular Room service appropriate? Yes; Fluid consistency: Thin  Diet effective now                    MEDICATIONS: Scheduled Meds:  Chlorhexidine Gluconate Cloth  6 each Topical Daily   cholecalciferol  1,000 Units Oral Daily   enoxaparin (LOVENOX) injection  40 mg Subcutaneous Q24H   folic acid  1 mg Oral Daily   lactulose  30 g Oral TID   metoprolol tartrate  50 mg Oral BID   midodrine  10 mg Oral TID WC   polyethylene glycol  17 g Oral Daily   sodium chloride flush  10-40 mL Intracatheter Q12H   sodium chloride flush  3 mL Intravenous Q12H   thiamine  100 mg Oral Daily   Vitamin D (Ergocalciferol)  50,000 Units Oral Q7 days   Continuous Infusions:  ertapenem 1 g (01/17/23 0916)   PRN Meds:.acetaminophen, bisacodyl, diltiazem, HYDROmorphone (DILAUDID) injection, lactulose, methocarbamol, mouth rinse, [DISCONTINUED] oxyCODONE **OR** oxyCODONE, polyethylene glycol, sodium chloride flush, sodium chloride flush   I have personally reviewed following labs and imaging studies  LABORATORY DATA:  Recent Labs  Lab 01/12/23 0426 01/13/23 0415 01/16/23 0454  WBC 4.7 5.2 4.8  HGB 10.7* 10.2* 10.3*  HCT 31.9* 30.8* 32.8*  PLT 225 210 188  MCV 89.4 89.5 93.2  MCH 30.0 29.7 29.3  MCHC 33.5 33.1 31.4  RDW 14.2 13.8 14.6    Recent Labs  Lab 01/12/23 0426 01/13/23 0415 01/16/23 0454  NA 132* 131* 136  K 3.9 3.9 4.0  CL 102 100 104  CO2 26 26 26   ANIONGAP 4* 5 6  GLUCOSE 94 148* 100*  BUN 9 21* 13  CREATININE 0.63 1.07 0.68  AST 21 19 27   ALT 14 13 19   ALKPHOS 103 121 112  BILITOT 1.3* 0.9 0.8  ALBUMIN 2.1* 2.0* 2.1*  CRP  --   --  0.9  AMMONIA  --   --  25  CALCIUM 8.7* 8.8* 8.5*    No results found for: "CHOL", "HDL", "LDLCALC",  "LDLDIRECT", "TRIG", "CHOLHDL"    Recent Labs  Lab 01/12/23 0426 01/13/23 0415 01/16/23 0454  CRP  --   --  0.9  AMMONIA  --   --  25  CALCIUM 8.7* 8.8* 8.5*       MICROBIOLOGY: Recent Results (from the past 240 hour(s))  Aerobic/Anaerobic Culture w Gram Stain (surgical/deep wound)     Status: None   Collection Time: 01/12/23  1:13 PM   Specimen: Path Tissue  Result Value Ref Range Status   Specimen Description TISSUE  Final   Special Requests RT KNEE A 1  Final   Gram Stain   Final    RARE WBC PRESENT, PREDOMINANTLY PMN NO ORGANISMS SEEN    Culture   Final    No growth aerobically or anaerobically. Performed at [facility], [address] [place], [place] [address]    Report Status 01/17/2023 FINAL  Final  Aerobic/Anaerobic Culture w Gram Stain (surgical/deep wound)     Status: None   Collection Time: 01/12/23  1:16 PM   Specimen: Path Tissue  Result Value Ref Range Status   Specimen Description TISSUE  Final   Special Requests RT KNEE B 2  Final   Gram Stain   Final    RARE WBC PRESENT, PREDOMINANTLY PMN NO ORGANISMS SEEN    Culture   Final    No growth aerobically or anaerobically. Performed at [facility], [address] [place], [place] [address]    Report Status 01/17/2023 FINAL  Final     RADIOLOGY STUDIES/RESULTS: No results found.   LOS: 21 days   Signature  -    Susa Raring M.D on 01/18/2023 at 9:00 AM   -  To page go to [email or web address]

## 2023-01-18 NOTE — Plan of Care (Signed)
Pt has rested quietly throughout the  night with no distress noted. Alert and oriented to person, place and situation. Forgetful. ON room air. Pt is not on tele monitor. Medicated for pain twice with relief noted. Dressing to right knee CDI. Voids per urinal. No other complaints voiced.     Problem: Education: Goal: Knowledge of disease or condition will improve Outcome: Progressing Goal: Understanding of medication regimen will improve Outcome: Progressing Goal: Individualized Educational Video(s) Outcome: Progressing   Problem: Health Behavior/Discharge Planning: Goal: Ability to safely manage health-related needs after discharge will improve Outcome: Progressing   Problem: Education: Goal: Knowledge of General Education information will improve Description: Including pain rating scale, medication(s)/side effects and non-pharmacologic comfort measures Outcome: Progressing   Problem: Health Behavior/Discharge Planning: Goal: Ability to manage health-related needs will improve Outcome: Progressing   Problem: Clinical Measurements: Goal: Ability to maintain clinical measurements within normal limits will improve Outcome: Progressing Goal: Diagnostic test results will improve Outcome: Progressing Goal: Respiratory complications will improve Outcome: Progressing   Problem: Coping: Goal: Level of anxiety will decrease Outcome: Progressing   Problem: Elimination: Goal: Will not experience complications related to bowel motility Outcome: Progressing   Problem: Pain Management: Goal: General experience of comfort will improve Outcome: Progressing   Problem: Safety: Goal: Ability to remain free from injury will improve Outcome: Progressing   Problem: Skin Integrity: Goal: Risk for impaired skin integrity will decrease Outcome: Progressing

## 2023-01-19 DIAGNOSIS — M00861 Arthritis due to other bacteria, right knee: Secondary | ICD-10-CM | POA: Diagnosis not present

## 2023-01-19 LAB — COMPREHENSIVE METABOLIC PANEL
ALT: 17 U/L (ref 0–44)
AST: 23 U/L (ref 15–41)
Albumin: 2.2 g/dL — ABNORMAL LOW (ref 3.5–5.0)
Alkaline Phosphatase: 130 U/L — ABNORMAL HIGH (ref 38–126)
Anion gap: 6 (ref 5–15)
BUN: 13 mg/dL (ref 6–20)
CO2: 28 mmol/L (ref 22–32)
Calcium: 8.3 mg/dL — ABNORMAL LOW (ref 8.9–10.3)
Chloride: 102 mmol/L (ref 98–111)
Creatinine, Ser: 0.6 mg/dL — ABNORMAL LOW (ref 0.61–1.24)
GFR, Estimated: >60 mL/min (ref >60)
Glucose, Bld: 121 mg/dL — ABNORMAL HIGH (ref 70–99)
Potassium: 4.1 mmol/L (ref 3.5–5.1)
Sodium: 136 mmol/L (ref 135–145)
Total Bilirubin: 0.8 mg/dL (ref <1.2)
Total Protein: 6 g/dL — ABNORMAL LOW (ref 6.5–8.1)

## 2023-01-19 LAB — CBC
HCT: 33.4 % — ABNORMAL LOW (ref 39.0–52.0)
Hemoglobin: 10.6 g/dL — ABNORMAL LOW (ref 13.0–17.0)
MCH: 29.1 pg (ref 26.0–34.0)
MCHC: 31.7 g/dL (ref 30.0–36.0)
MCV: 91.8 fL (ref 80.0–100.0)
Platelets: 199 10*3/uL (ref 150–400)
RBC: 3.64 MIL/uL — ABNORMAL LOW (ref 4.22–5.81)
RDW: 14.7 % (ref 11.5–15.5)
WBC: 4.9 10*3/uL (ref 4.0–10.5)
nRBC: 0 % (ref 0.0–0.2)

## 2023-01-19 LAB — C-REACTIVE PROTEIN: CRP: 0.9 mg/dL (ref <1.0)

## 2023-01-19 LAB — AMMONIA: Ammonia: 54 umol/L — ABNORMAL HIGH (ref 9–35)

## 2023-01-19 MED ORDER — CELECOXIB 200 MG PO CAPS
200.0000 mg | ORAL_CAPSULE | Freq: Two times a day (BID) | ORAL | Status: DC
  Administered 2023-01-19 – 2023-01-27 (×16): 200 mg via ORAL
  Filled 2023-01-19 (×18): qty 1

## 2023-01-19 NOTE — Progress Notes (Signed)
PT Cancellation Note  Patient Details Name: Jonathan Ibarra MRN: [id number] DOB: September 13, 1965   Cancelled Treatment:    Reason Eval/Treat Not Completed: Pain limiting ability to participate (Pt states that he really wants to move but he's in too much pain right now. Politely requests PT to come back.)   Gladys Damme 01/19/2023, 2:16 PM

## 2023-01-19 NOTE — Progress Notes (Addendum)
Orthopaedic Trauma Progress Note  SUBJECTIVE: Doing ok this morning. Notes that pain has remained about the same. Feels like he still has a lot of swelling in the knee and lower leg. Asking for an anti-inflammatory. Will add Celebrex BID.   New wound cultures obtained intraoperatively on 01/12/2023, no growth.  OBJECTIVE:  Vitals:   01/19/23 0430 01/19/23 0843  BP: 99/65 90/60  Pulse: 89 87  Resp: 19 20  Temp: 97.9 F (36.6 C) 97.8 F (36.6 C)  SpO2: 95%     General: Sitting up in bed, no acute distress Respiratory: No increased work of breathing.  Right lower extremity:  Dressing changed, incision as below. Appears stable. No active drainage. Swelling improving. I did remove 3 proximal sutures. Tolerates gentle knee ROM. Ankle dorsiflexion/plantarflexion is intact. Soreness through the calf, improving.  +EHL/FHL intact.  Neurovascularly intact.   IMAGING: Repeat imaging right knee performed 01/12/2023 shows stable appearance to fixation.  No erosive changes noted.  LABS:  Results for orders placed or performed during the hospital encounter of 12/27/22 (from the past 24 hour(s))  Ammonia     Status: Abnormal   Collection Time: 01/19/23  4:09 AM  Result Value Ref Range   Ammonia 54 (H) 9 - 35 umol/L  C-reactive protein     Status: None   Collection Time: 01/19/23  4:09 AM  Result Value Ref Range   CRP 0.9 <1.0 mg/dL  Comprehensive metabolic panel     Status: Abnormal   Collection Time: 01/19/23  4:09 AM  Result Value Ref Range   Sodium 136 135 - 145 mmol/L   Potassium 4.1 3.5 - 5.1 mmol/L   Chloride 102 98 - 111 mmol/L   CO2 28 22 - 32 mmol/L   Glucose, Bld 121 (H) 70 - 99 mg/dL   BUN 13 6 - 20 mg/dL   Creatinine, Ser 1.09 (L) 0.61 - 1.24 mg/dL   Calcium 8.3 (L) 8.9 - 10.3 mg/dL   Total Protein 6.0 (L) 6.5 - 8.1 g/dL   Albumin 2.2 (L) 3.5 - 5.0 g/dL   AST 23 15 - 41 U/L   ALT 17 0 - 44 U/L   Alkaline Phosphatase 130 (H) 38 - 126 U/L   Total Bilirubin 0.8 <1.2 mg/dL    GFR, Estimated >32 >35 mL/min   Anion gap 6 5 - 15  CBC     Status: Abnormal   Collection Time: 01/19/23  4:09 AM  Result Value Ref Range   WBC 4.9 4.0 - 10.5 K/uL   RBC 3.64 (L) 4.22 - 5.81 MIL/uL   Hemoglobin 10.6 (L) 13.0 - 17.0 g/dL   HCT 57.3 (L) 22.0 - 25.4 %   MCV 91.8 80.0 - 100.0 fL   MCH 29.1 26.0 - 34.0 pg   MCHC 31.7 30.0 - 36.0 g/dL   RDW 27.0 62.3 - 76.2 %   Platelets 199 150 - 400 K/uL   nRBC 0.0 0.0 - 0.2 %      ASSESSMENT: Jonathan Ibarra is a 57 y.o. male s/p IRRIGATION AND DEBRIDEMENT RIGHT KNEE on 01/12/2023 and 12/29/22  Previous ORIF of right tibial plateau fracture with repair of right lateral meniscus tear on 12/13/2022  PLAN: Weightbearing: TDWB RLE ROM: Okay for range of motion of the knee as tolerated Incisional and dressing care: Dressing changed today. Continue daily dressing changes by nursing Showering: Ok to shower with wound covered.  Orthopedic device(s): None Pain management:  1. Tylenol 325 mg q 6 hours PRN  2. Robaxin 750 mg q 6 hours PRN 3. Oxycodone 5-10 mg q 4 hours PRN 4. Dilaudid 0.5-1 mg q 3 hours PRN VTE prophylaxis: Lovenox.  SCDs ID: Per infectious disease Foley/Lines:  No foley, KVO IVFs Impediments to Fracture Healing:  Infection.  Vitamin D level 28, continue supplementation Dispo: Continue therapies as tolerated.  Patient okay to shower from ortho standpoint.  Wound looks good today, that is encouraging. Will continue to monitor wound but hopeful no additional I&D's will be required.  Follow - up plan: 2 weeks after d/c    Contact information:  Truitt Merle MD, Thyra Breed PA-C. After hours and holidays please check [email or web address] for group call information for [facility]   Thompson Caul, PA-C [phone] (office) [email or web address]

## 2023-01-19 NOTE — Plan of Care (Signed)
  Problem: Health Behavior/Discharge Planning: Goal: Ability to manage health-related needs will improve Outcome: Progressing   Problem: Clinical Measurements: Goal: Ability to maintain clinical measurements within normal limits will improve Outcome: Progressing   Problem: Pain Management: Goal: General experience of comfort will improve Outcome: Progressing   Problem: Skin Integrity: Goal: Risk for impaired skin integrity will decrease Outcome: Progressing

## 2023-01-19 NOTE — Plan of Care (Signed)
  Problem: Education: Goal: Knowledge of disease or condition will improve Outcome: Progressing Goal: Understanding of medication regimen will improve Outcome: Progressing Goal: Individualized Educational Video(s) Outcome: Progressing   Problem: Health Behavior/Discharge Planning: Goal: Ability to safely manage health-related needs after discharge will improve Outcome: Progressing   Problem: Education: Goal: Knowledge of General Education information will improve Description: Including pain rating scale, medication(s)/side effects and non-pharmacologic comfort measures Outcome: Progressing

## 2023-01-19 NOTE — Progress Notes (Signed)
PROGRESS NOTE        PATIENT DETAILS Name: Jonathan Ibarra Age: 57 y.o. Sex: male Date of Birth: Mar 08, 1965 Admit Date: 12/27/2022 Admitting Physician Osvaldo Shipper, MD FUX:NATFT, [name], MD  Brief Summary: Patient is a 57 y.o.  male with history of liver cirrhosis, HTN, polysubstance abuse-recent hospitalization from 10/13-10/18 following trauma with fall resulting in a right tibia plateau fracture requiring ORIF on 10/14-presented with purulent discharge from his right knee-he was thought to have septic arthritis and subsequently admitted to the hospitalist service.  Significant events: 10/13-10/18 >> hospitalization-right tibial plateau fracture following a fall-s/p ORIF.   10/28>> purulent discharge from right knee-septic arthritis-admit to [facility] 10/30>> irrigation/debridement of right knee 11/11>> found to have purulence from right knee 11/13>> repeat irrigation/debridement  Significant studies: 08/02>> echo: EF 55-60% 10/28>> x-ray right knee: Unchanged lateral plate/screw fixation across comminuted fracture involving medial/lateral tibial plateaus, moderate knee joint effusion 10/29>> CXR: No active disease  Significant microbiology data: 10/29>> blood culture: 1/2-Corynebacterium (contamination) 10/30>> right knee abscess culture: Klebsiella pneumoniae-ESBL 10/31>> blood culture: No growth 11/13>> right knee abscess culture: No growth  Procedures: 10/30>> irrigation and debridement of right knee 11/13>> repeat irrigation/debridement.  Consults: Ortho  Infectious disease  Subjective:   Patient in bed, appears comfortable, denies any headache, no fever, no chest pain or pressure, no shortness of breath , no abdominal pain. No new focal weakness.   Objective: Vitals: Blood pressure 99/65, pulse 89, temperature 97.9 F (36.6 C), temperature source Oral, resp. rate 19, height 6' (1.829 m), weight 117.9 kg, SpO2 95%.   Exam:  Awake  Alert, No new F.N deficits, Normal affect [place].AT,PERRAL Supple Neck, No JVD,   Symmetrical Chest wall movement, Good air movement bilaterally, CTAB RRR,No Gallops, Rubs or new Murmurs,  +ve B.Sounds, Abd Soft, No tenderness,   Right leg under bandage    Assessment/Plan:  Septic arthritis right knee (ESBL Klebsiella pneumoniae)-with recent history of ORIF on 10/14 with hardware in place S/p I&D 10/30-VAC discontinued 11/4-unfortunately purulence was observed on 11/11 inspite of being on IV antibiotics-underwent repeat irrigation/debridement on 11/13 Cultures from 11/13 pending-but negative so far Remains on IV Invanz  Discussed with ID-11/15 Dr. Remi Deter weeks of IV Invanz from 11/13-possible suppression thereafter with PO Omadacycline thereafter.  Please reconsult infectious disease when closer to discharge.  Not a candidate for outpatient IV therapies given history of polysubstance abuse. Ortho following-WBAT as tolerated.  Note-patient asked to not change dressing by himself-he is at risk of recurrent infection-have discussed him with at length-and to let nursing staff do it with strict aseptic precautions.    PAF with RVR Rate controlled with oral metoprolol-continue midodrine for BP support Not a candidate for anticoagulation-in any events CHADS2-VASC score of 1  Liver cirrhosis-s/p TIPS Relatively well compensated Volume status relatively stable Continue lactulose. Diuretics on hold due to soft BP (on midodrine for BP support)  Polysubstance abuse Acknowledges ongoing meth/THC use and remote cocaine use-although UDS positive for cocaine Ongoing counseling  Obesity: Estimated body mass index is 35.26 kg/m as calculated from the following:   Height as of this encounter: 6' (1.829 m).   Weight as of this encounter: 117.9 kg.   Code status:   Code Status: Full Code   DVT Prophylaxis: enoxaparin (LOVENOX) injection 40 mg Start: 12/30/22 0900 SCDs Start: 12/28/22  0816   Family Communication: None at  bedside   Disposition Plan: Status is: Inpatient Remains inpatient appropriate because: Severity of illness   Planned Discharge Destination:Home   Diet: Diet Order             Diet regular Room service appropriate? Yes; Fluid consistency: Thin  Diet effective now                    MEDICATIONS: Scheduled Meds:  Chlorhexidine Gluconate Cloth  6 each Topical Daily   cholecalciferol  1,000 Units Oral Daily   enoxaparin (LOVENOX) injection  40 mg Subcutaneous Q24H   folic acid  1 mg Oral Daily   lactulose  30 g Oral TID   metoprolol tartrate  50 mg Oral BID   midodrine  10 mg Oral TID WC   polyethylene glycol  17 g Oral Daily   sodium chloride flush  10-40 mL Intracatheter Q12H   sodium chloride flush  3 mL Intravenous Q12H   thiamine  100 mg Oral Daily   Vitamin D (Ergocalciferol)  50,000 Units Oral Q7 days   Continuous Infusions:  ertapenem 1 g (01/18/23 1056)   PRN Meds:.acetaminophen, bisacodyl, diltiazem, HYDROmorphone (DILAUDID) injection, lactulose, methocarbamol, mouth rinse, [DISCONTINUED] oxyCODONE **OR** oxyCODONE, polyethylene glycol, sodium chloride flush, sodium chloride flush   I have personally reviewed following labs and imaging studies  LABORATORY DATA:  Recent Labs  Lab 01/13/23 0415 01/16/23 0454 01/19/23 0409  WBC 5.2 4.8 4.9  HGB 10.2* 10.3* 10.6*  HCT 30.8* 32.8* 33.4*  PLT 210 188 199  MCV 89.5 93.2 91.8  MCH 29.7 29.3 29.1  MCHC 33.1 31.4 31.7  RDW 13.8 14.6 14.7    Recent Labs  Lab 01/13/23 0415 01/16/23 0454 01/19/23 0409  NA 131* 136 136  K 3.9 4.0 4.1  CL 100 104 102  CO2 26 26 28   ANIONGAP 5 6 6   GLUCOSE 148* 100* 121*  BUN 21* 13 13  CREATININE 1.07 0.68 0.60*  AST 19 27 23   ALT 13 19 17   ALKPHOS 121 112 130*  BILITOT 0.9 0.8 0.8  ALBUMIN 2.0* 2.1* 2.2*  CRP  --  0.9 0.9  AMMONIA  --  25 54*  CALCIUM 8.8* 8.5* 8.3*    No results found for: "CHOL", "HDL", "LDLCALC",  "LDLDIRECT", "TRIG", "CHOLHDL"    Recent Labs  Lab 01/13/23 0415 01/16/23 0454 01/19/23 0409  CRP  --  0.9 0.9  AMMONIA  --  25 54*  CALCIUM 8.8* 8.5* 8.3*       MICROBIOLOGY: Recent Results (from the past 240 hour(s))  Aerobic/Anaerobic Culture w Gram Stain (surgical/deep wound)     Status: None   Collection Time: 01/12/23  1:13 PM   Specimen: Path Tissue  Result Value Ref Range Status   Specimen Description TISSUE  Final   Special Requests RT KNEE A 1  Final   Gram Stain   Final    RARE WBC PRESENT, PREDOMINANTLY PMN NO ORGANISMS SEEN    Culture   Final    No growth aerobically or anaerobically. Performed at [facility], [address], [place], [place] [address]    Report Status 01/17/2023 FINAL  Final  Aerobic/Anaerobic Culture w Gram Stain (surgical/deep wound)     Status: None   Collection Time: 01/12/23  1:16 PM   Specimen: Path Tissue  Result Value Ref Range Status   Specimen Description TISSUE  Final   Special Requests RT KNEE B 2  Final   Gram Stain  Final    RARE WBC PRESENT, PREDOMINANTLY PMN NO ORGANISMS SEEN    Culture   Final    No growth aerobically or anaerobically. Performed at [facility], [address], [place], [place] [address]    Report Status 01/17/2023 FINAL  Final     RADIOLOGY STUDIES/RESULTS: No results found.   LOS: 22 days   Signature  -    Susa Raring M.D on 01/19/2023 at 7:40 AM   -  To page go to [email or web address]

## 2023-01-20 ENCOUNTER — Other Ambulatory Visit: Payer: Self-pay

## 2023-01-20 ENCOUNTER — Inpatient Hospital Stay (HOSPITAL_COMMUNITY): Payer: Commercial Managed Care - HMO

## 2023-01-20 DIAGNOSIS — M00861 Arthritis due to other bacteria, right knee: Secondary | ICD-10-CM | POA: Diagnosis not present

## 2023-01-20 MED ORDER — ALTEPLASE 2 MG IJ SOLR
2.0000 mg | Freq: Once | INTRAMUSCULAR | Status: DC
  Filled 2023-01-20: qty 2

## 2023-01-20 NOTE — Plan of Care (Signed)
  Problem: Clinical Measurements: Goal: Ability to maintain clinical measurements within normal limits will improve Outcome: Progressing   Problem: Pain Management: Goal: General experience of comfort will improve Outcome: Progressing   Problem: Safety: Goal: Ability to remain free from injury will improve Outcome: Progressing

## 2023-01-20 NOTE — Progress Notes (Signed)
PROGRESS NOTE        PATIENT DETAILS Name: Jonathan Ibarra Age: 57 y.o. Sex: male Date of Birth: 1966-02-03 Admit Date: 12/27/2022 Admitting Physician Osvaldo Shipper, MD [id number], [name], MD  Brief Summary: Patient is a 57 y.o.  male with history of liver cirrhosis, HTN, polysubstance abuse-recent hospitalization from 10/13-10/18 following trauma with fall resulting in a right tibia plateau fracture requiring ORIF on 10/14-presented with purulent discharge from his right knee-he was thought to have septic arthritis and subsequently admitted to the hospitalist service.  Significant events: 10/13-10/18 >> hospitalization-right tibial plateau fracture following a fall-s/p ORIF.   10/28>> purulent discharge from right knee-septic arthritis-admit to [facility] 10/30>> irrigation/debridement of right knee 11/11>> found to have purulence from right knee 11/13>> repeat irrigation/debridement  Significant studies: 08/02>> echo: EF 55-60% 10/28>> x-ray right knee: Unchanged lateral plate/screw fixation across comminuted fracture involving medial/lateral tibial plateaus, moderate knee joint effusion 10/29>> CXR: No active disease  Significant microbiology data: 10/29>> blood culture: 1/2-Corynebacterium (contamination) 10/30>> right knee abscess culture: Klebsiella pneumoniae-ESBL 10/31>> blood culture: No growth 11/13>> right knee abscess culture: No growth  Procedures: 10/30>> irrigation and debridement of right knee 11/13>> repeat irrigation/debridement.  Consults: Ortho  Infectious disease  Subjective:   Patient in bed, appears comfortable, denies any headache, no fever, no chest pain or pressure, no shortness of breath , no abdominal pain. No new focal weakness.   Objective: Vitals: Blood pressure 106/70, pulse 79, temperature 98.6 F (37 C), temperature source Oral, resp. rate 18, height 6' (1.829 m), weight 117.9 kg, SpO2 96%.   Exam:  Awake  Alert, No new F.N deficits, Normal affect [place].AT,PERRAL Supple Neck, No JVD,   Symmetrical Chest wall movement, Good air movement bilaterally, CTAB RRR,No Gallops, Rubs or new Murmurs,  +ve B.Sounds, Abd Soft, No tenderness,   Right leg under bandage    Assessment/Plan:  Septic arthritis right knee (ESBL Klebsiella pneumoniae)-with recent history of ORIF on 10/14 with hardware in place, being followed by orthopedics Dr. Jena Gauss and ID team. S/p I&D 10/30-VAC discontinued 11/4-unfortunately purulence was observed on 11/11 inspite of being on IV antibiotics-underwent repeat irrigation/debridement on 11/13 Cultures from 11/13 pending-but negative so far Remains on IV Invanz  Discussed with ID-11/15 Dr. Remi Deter weeks of IV Invanz from 11/13-possible suppression thereafter with PO Omadacycline thereafter.  Please reconsult infectious disease when closer to discharge.  Not a candidate for outpatient IV therapies given history of polysubstance abuse. Ortho following-WBAT as tolerated.  Note-patient asked to not change dressing by himself-he is at risk of recurrent infection-have discussed him with at length-and to let nursing staff do it with strict aseptic precautions.    PAF with RVR Rate controlled with oral metoprolol-continue midodrine for BP support Not a candidate for anticoagulation-in any events CHADS2-VASC score of 1  Liver cirrhosis-s/p TIPS Relatively well compensated Volume status relatively stable Continue lactulose. Diuretics on hold due to soft BP (on midodrine for BP support)  Polysubstance abuse Acknowledges ongoing meth/THC use and remote cocaine use-although UDS positive for cocaine Ongoing counseling  Obesity: Estimated body mass index is 35.26 kg/m as calculated from the following:   Height as of this encounter: 6' (1.829 m).   Weight as of this encounter: 117.9 kg.   Code status:   Code Status: Full Code   DVT Prophylaxis: enoxaparin (LOVENOX)  injection 40 mg Start: 12/30/22 0900 SCDs  Start: 12/28/22 0816   Family Communication: None at bedside   Disposition Plan: Status is: Inpatient Remains inpatient appropriate because: Severity of illness   Planned Discharge Destination:Home   Diet: Diet Order             Diet regular Room service appropriate? Yes; Fluid consistency: Thin  Diet effective now                    MEDICATIONS: Scheduled Meds:  celecoxib  200 mg Oral BID   Chlorhexidine Gluconate Cloth  6 each Topical Daily   cholecalciferol  1,000 Units Oral Daily   enoxaparin (LOVENOX) injection  40 mg Subcutaneous Q24H   folic acid  1 mg Oral Daily   lactulose  30 g Oral TID   metoprolol tartrate  50 mg Oral BID   midodrine  10 mg Oral TID WC   polyethylene glycol  17 g Oral Daily   sodium chloride flush  10-40 mL Intracatheter Q12H   sodium chloride flush  3 mL Intravenous Q12H   thiamine  100 mg Oral Daily   Vitamin D (Ergocalciferol)  50,000 Units Oral Q7 days   Continuous Infusions:  ertapenem 1 g (01/19/23 0937)   PRN Meds:.acetaminophen, bisacodyl, diltiazem, HYDROmorphone (DILAUDID) injection, lactulose, methocarbamol, mouth rinse, [DISCONTINUED] oxyCODONE **OR** oxyCODONE, polyethylene glycol, sodium chloride flush, sodium chloride flush   I have personally reviewed following labs and imaging studies  LABORATORY DATA:  Recent Labs  Lab 01/16/23 0454 01/19/23 0409  WBC 4.8 4.9  HGB 10.3* 10.6*  HCT 32.8* 33.4*  PLT 188 199  MCV 93.2 91.8  MCH 29.3 29.1  MCHC 31.4 31.7  RDW 14.6 14.7    Recent Labs  Lab 01/16/23 0454 01/19/23 0409  NA 136 136  K 4.0 4.1  CL 104 102  CO2 26 28  ANIONGAP 6 6  GLUCOSE 100* 121*  BUN 13 13  CREATININE 0.68 0.60*  AST 27 23  ALT 19 17  ALKPHOS 112 130*  BILITOT 0.8 0.8  ALBUMIN 2.1* 2.2*  CRP 0.9 0.9  AMMONIA 25 54*  CALCIUM 8.5* 8.3*    No results found for: "CHOL", "HDL", "LDLCALC", "LDLDIRECT", "TRIG", "CHOLHDL"    Recent Labs   Lab 16/10/96 0454 01/19/23 0409  CRP 0.9 0.9  AMMONIA 25 54*  CALCIUM 8.5* 8.3*       MICROBIOLOGY: Recent Results (from the past 240 hour(s))  Aerobic/Anaerobic Culture w Gram Stain (surgical/deep wound)     Status: None   Collection Time: 01/12/23  1:13 PM   Specimen: Path Tissue  Result Value Ref Range Status   Specimen Description TISSUE  Final   Special Requests RT KNEE A 1  Final   Gram Stain   Final    RARE WBC PRESENT, PREDOMINANTLY PMN NO ORGANISMS SEEN    Culture   Final    No growth aerobically or anaerobically. Performed at [facility], [address] [place], [place] [address]    Report Status 01/17/2023 FINAL  Final  Aerobic/Anaerobic Culture w Gram Stain (surgical/deep wound)     Status: None   Collection Time: 01/12/23  1:16 PM   Specimen: Path Tissue  Result Value Ref Range Status   Specimen Description TISSUE  Final   Special Requests RT KNEE B 2  Final   Gram Stain   Final    RARE WBC PRESENT, PREDOMINANTLY PMN NO ORGANISMS SEEN    Culture   Final    No growth  aerobically or anaerobically. Performed at [facility], [address], [place], [place] [address]    Report Status 01/17/2023 FINAL  Final     RADIOLOGY STUDIES/RESULTS: No results found.   LOS: 23 days   Signature  -    Susa Raring M.D on 01/20/2023 at 9:15 AM   -  To page go to [email or web address]

## 2023-01-20 NOTE — Plan of Care (Signed)
  Problem: Education: Goal: Knowledge of disease or condition will improve Outcome: Progressing Goal: Understanding of medication regimen will improve Outcome: Progressing Goal: Individualized Educational Video(s) Outcome: Progressing   Problem: Health Behavior/Discharge Planning: Goal: Ability to safely manage health-related needs after discharge will improve Outcome: Progressing   Problem: Education: Goal: Knowledge of General Education information will improve Description: Including pain rating scale, medication(s)/side effects and non-pharmacologic comfort measures Outcome: Progressing

## 2023-01-20 NOTE — Progress Notes (Signed)
Peripherally Inserted Central Catheter Placement  The IV Nurse has discussed with the patient and/or persons authorized to consent for the patient, the purpose of this procedure and the potential benefits and risks involved with this procedure.  The benefits include less needle sticks, lab draws from the catheter, and the patient may be discharged home with the catheter. Risks include, but not limited to, infection, bleeding, blood clot (thrombus formation), and puncture of an artery; nerve damage and irregular heartbeat and possibility to perform a PICC exchange if needed/ordered by physician.  Alternatives to this procedure were also discussed.  Bard Power PICC patient education guide, fact sheet on infection prevention and patient information card has been provided to patient /or left at bedside.    PICC Placement Documentation  PICC Single Lumen 01/20/23 Right Brachial 40 cm 0 cm (Active)  Indication for Insertion or Continuance of Line Prolonged intravenous therapies 01/20/23 1819  Exposed Catheter (cm) 0 cm 01/20/23 1819  Site Assessment Clean, Dry, Intact 01/20/23 1819  Line Status Flushed;Saline locked;Blood return noted 01/20/23 1819  Dressing Type Transparent;Securing device 01/20/23 1819  Dressing Status Antimicrobial disc in place;Clean, Dry, Intact 01/20/23 1819  Line Care Connections checked and tightened 01/20/23 1819  Line Adjustment (NICU/IV Team Only) No 01/20/23 1819  Dressing Intervention New dressing;Adhesive placed at insertion site (IV team only);Other (Comment) 01/20/23 1819  Dressing Change Due 01/27/23 01/20/23 1819    PICC exchange done due to PICC tip malposition and catheter kinked in the upper arm.   Annett Fabian 01/20/2023, 6:20 PM

## 2023-01-20 NOTE — Progress Notes (Signed)
Physical Therapy Treatment Patient Details Name: Jonathan Ibarra MRN: [id number] DOB: 01/25/1966 Today's Date: 01/20/2023   History of Present Illness Pt is a 57 y/o male presenting 10/28 to [facility] with severe right knee pain and drainage after a fall the night PTA, he is now s/p R knee I&D on 12/29/22. He recently had a R tibial ORIF on 12/13/22 after a falls incidence. WUJ:WJXBJYN abuse, [name], HTN, liver cirrhosis, polysubstance abuse.    PT Comments  Pt received in the bathroom and agreeable to session. Pt appearing to initially WB through R toes and pt became agitated when cued for TDWB, so further education provided on WB status. Pt able to tolerate gait trial in the hallway, however continues to demonstrate decreased safety awareness with declining to follow cues for limiting distance and impulsivity with pace and RW management due to increased fatigue. Pt requesting to trial crutches, however question safety at this time due to pt's impulsivity. Pt continues to benefit from PT services to progress toward functional mobility goals.    If plan is discharge home, [name];[name];Assistance with cooking/housework;Assist for transportation;Help with stairs or ramp for entrance   Can travel by private vehicle        Equipment Recommendations  None recommended by PT    Recommendations for Other Services       Precautions / Restrictions Precautions Precautions: Fall Restrictions Weight Bearing Restrictions: Yes RLE Weight Bearing: Touchdown weight bearing     Mobility  Bed Mobility Overal bed mobility: Needs Assistance Bed Mobility: Sit to Supine     Supine to sit: Supervision          Transfers Overall transfer level: Needs assistance Equipment used: Rolling walker (2 wheels) Transfers: Sit to/from Stand Sit to Stand: Supervision           General transfer comment:  Cues for TDWB due to pt appearing to WB through RLE    Ambulation/Gait Ambulation/Gait assistance: Contact guard assist Gait Distance (Feet): 200 Feet Assistive device: Rolling walker (2 wheels) Gait Pattern/deviations: Decreased stride length, Step-to pattern, Trunk flexed       General Gait Details: Pt appearing to increase RLE WB with increased fatigue. Cues for safety and pacing throughout due to pt moving quickly and moving the RW forward while hopping with LLE support. Cues for pt to turn around due to increased fatigue, however pt continuing to go further      Balance Overall balance assessment: Needs assistance Sitting-balance support: No upper extremity supported, [name], During functional activity, Reliant on assistive device for balance Standing balance-Leahy Scale: Fair Standing balance comment: reliant on RW support                            Cognition Arousal: Alert Behavior During Therapy: WFL for tasks assessed/performed, Agitated Overall Cognitive Status: Within Functional Limits for tasks assessed                                 General Comments: some agitation with cues for TDWB and safety        Exercises      General Comments General comments (skin integrity, edema, etc.): R knee wound noted to be draining and bandage pulled down, RN  notified      Pertinent Vitals/Pain Pain Assessment Pain Assessment: 0-10 Pain Score: 9  Pain Location: R knee Pain Descriptors / Indicators: Discomfort, Aching, Moaning Pain Intervention(s): Monitored during session, Repositioned     PT Goals (current goals can now be found in the care plan section) Acute Rehab PT Goals Patient Stated Goal: decrease pain and go home PT Goal Formulation: With patient Time For Goal Achievement: 01/12/23 Progress towards PT goals: Progressing  toward goals    Frequency    Min 1X/week       AM-PAC PT "6 Clicks" Mobility   Outcome Measure  Help needed turning from your back to your side while in a flat bed without using bedrails?: None Help needed moving from lying on your back to sitting on the side of a flat bed without using bedrails?: None Help needed moving to and from a bed to a chair (including a wheelchair)?: A Little Help needed standing up from a chair using your arms (e.g., wheelchair or bedside chair)?: A Little Help needed to walk in hospital room?: A Little Help needed climbing 3-5 steps with a railing? : Total 6 Click Score: 18    End of Session   Activity Tolerance: Patient tolerated treatment well;Patient limited by fatigue;Patient limited by pain Patient left: in bed;with call bell/phone within reach Nurse Communication: Mobility status PT Visit Diagnosis: Muscle weakness (generalized) (M62.81);Other abnormalities of gait and mobility (R26.89)     Time: 1140-1159 PT Time Calculation (min) (ACUTE ONLY): 19 min  Charges:    $Gait Training: 8-22 mins PT General Charges $$ ACUTE PT VISIT: 1 Visit                     [name], PTA Acute Rehabilitation Services Secure Chat Preferred  [phone]    [name] 01/20/2023, 1:40 PM

## 2023-01-20 NOTE — Progress Notes (Addendum)
At bedside for TPA insertion due to [facility]. Upon assessment, PICC retracted from 1cm to 2cm prior to arrival. Attempted to flush SL PICC with difficulty. Resistance met. NBR. CXR ordered to verify tip termination. No TPA instilled at present. RN made aware.

## 2023-01-20 NOTE — Progress Notes (Signed)
Occupational Therapy Treatment Patient Details Name: Jonathan Ibarra MRN: [id number] DOB: 1965/05/29 Today's Date: 01/20/2023   History of present illness Pt is a 57 y/o male presenting 10/28 to [facility] with severe right knee pain and drainage after a fall the night PTA, he is now s/p R knee I&D on 12/29/22. He recently had a R tibial ORIF on 12/13/22 after a falls incidence. WUJ:WJXBJYN abuse, [name], HTN, liver cirrhosis, polysubstance abuse.   OT comments  Pt continues to do well with general ability to maintain his RLE WB precautions, which seems to become more challenging with fatigue. Pt reports he is looking for ways to support himself post DC, provided pt with contact for [facility]. OT to continue to progress pt as able, DC plans remain appropriate for [facility].      If plan is discharge home, recommend the following:  Assistance with cooking/housework;Help with stairs or ramp for entrance;Assist for transportation;[name];A little help with walking and/or transfers   Equipment Recommendations  Tub/shower bench    Recommendations for Other Services      Precautions / Restrictions Precautions Precautions: Fall Restrictions Weight Bearing Restrictions: Yes RLE Weight Bearing: Touchdown weight bearing       Mobility Bed Mobility Overal bed mobility: Needs Assistance Bed Mobility: Sit to Supine, Supine to Sit     Supine to sit: Supervision Sit to supine: Supervision        Transfers Overall transfer level: Needs assistance Equipment used: Rolling walker (2 wheels) Transfers: Sit to/from Stand Sit to Stand: Supervision           General transfer comment: Min cues for TDWB on RLE, pt seems to have a harder time with his as he fatigues     Balance Overall balance assessment: Needs assistance Sitting-balance support: No upper extremity supported, Feet supported Sitting balance-Leahy Scale: Good     Standing balance support:  Bilateral upper extremity supported, During functional activity, Reliant on assistive device for balance Standing balance-Leahy Scale: Fair Standing balance comment: reliant on RW support                           ADL either performed or assessed with clinical judgement   ADL                   Upper Body Dressing : Sitting;Set up;Supervision/safety Upper Body Dressing Details (indicate cue type and reason): don gown                 Functional mobility during ADLs: Contact guard assist;Rolling walker (2 wheels) General ADL Comments: Pt looking for resources for support post DC, provided pt with contact to [employer].    Extremity/Trunk Assessment              Vision       Perception     Praxis      Cognition Arousal: Alert Behavior During Therapy: WFL for tasks assessed/performed, Agitated Overall Cognitive Status: Within Functional Limits for tasks assessed                                 General Comments: some agitation with cues for TDWB and safety        Exercises      Shoulder Instructions       General Comments Pt pulled down bandage, some drainage of R knee noted  Pertinent Vitals/ Pain       Pain Assessment Pain Assessment: Faces Faces Pain Scale: Hurts even more Pain Location: R knee after mobility Pain Descriptors / Indicators: Discomfort, Aching, Moaning Pain Intervention(s): Limited activity within patient's tolerance, Monitored during session, Repositioned  Home Living                                          Prior Functioning/Environment              Frequency  Min 1X/week        Progress Toward Goals  OT Goals(current goals can now be found in the care plan section)  Progress towards OT goals: Progressing toward goals  Acute Rehab OT Goals Patient Stated Goal: To get a cell phone OT Goal Formulation: With patient Time For Goal Achievement: 01/26/23 Potential to  Achieve Goals: Good  Plan      Co-evaluation                 AM-PAC OT "6 Clicks" Daily Activity     Outcome Measure   Help from another person eating meals?: None Help from another person taking care of personal grooming?: A Little Help from another person toileting, which includes using toliet, bedpan, or urinal?: A Little Help from another person bathing (including washing, rinsing, drying)?: A Little Help from another person to put on and taking off regular upper body clothing?: A Little Help from another person to put on and taking off regular lower body clothing?: A Lot 6 Click Score: 18    End of Session Equipment Utilized During Treatment: Rolling walker (2 wheels)  OT Visit Diagnosis: Pain;Unsteadiness on feet (R26.81);Other abnormalities of gait and mobility (R26.89);History of falling (Z91.81) Pain - Right/Left: Right Pain - part of body: Knee   Activity Tolerance Patient tolerated treatment well   Patient Left in bed;with call bell/phone within reach   Nurse Communication Mobility status        Time: 2130-8657 OT Time Calculation (min): 22 min  Charges: OT General Charges $OT Visit: 1 Visit OT Treatments $Therapeutic Activity: 8-22 mins  01/20/2023  AB, OTR/L  Acute Rehabilitation Services  Office: [phone]   Tristan Schroeder 01/20/2023, 5:39 PM

## 2023-01-20 NOTE — TOC Progression Note (Addendum)
Transition of Care [facility]) - Progression Note    Patient Details  Name: Jonathan Ibarra MRN: [id number] Date of Birth: [birth year]  Transition of Care [facility]) CM/SW Contact  Mearl Latin, LCSW Phone Number: 01/20/2023, 4:13 PM  Clinical Narrative:    CSW received request to speak with patient. Patient shared that he lost his daughter and then soon after ended up in prison. He has been staying with someone but is requesting assistance with obtaining an ID, SS card, his own housing, food stamps, and disability as he has been denied for all. CSW provided community resources and completed Care 360 referrals for patient. CSW emailed Financial Counseling to request Disability screening.         Expected Discharge Plan and Services                                               Social Determinants of Health (SDOH) Interventions SDOH Screenings   Food Insecurity: Food Insecurity Present (12/28/2022)  Housing: High Risk (12/28/2022)  Transportation Needs: Unmet Transportation Needs (12/28/2022)  Utilities: Not At Risk (12/28/2022)  Financial Resource Strain: Medium Risk (01/07/2020)   Received from [facility], [facility]  Social Connections: Unknown (07/05/2021)   Received from [facility], [facility]  Stress: No Stress Concern Present (01/07/2020)   Received from [facility], [facility]  Tobacco Use: High Risk (01/12/2023)    Readmission Risk Interventions     No data to display

## 2023-01-21 DIAGNOSIS — M00861 Arthritis due to other bacteria, right knee: Secondary | ICD-10-CM | POA: Diagnosis not present

## 2023-01-21 NOTE — Plan of Care (Signed)
Problem: Health Behavior/Discharge Planning: Goal: Ability to manage health-related needs will improve Outcome: Progressing   Problem: Pain Management: Goal: General experience of comfort will improve Outcome: Progressing   Problem: Safety: Goal: Ability to remain free from injury will improve Outcome: Progressing

## 2023-01-21 NOTE — Progress Notes (Signed)
Mobility Specialist Progress Note;   01/21/23 1515  Mobility  Activity Ambulated with assistance in hallway  Level of Assistance Contact guard assist, steadying assist  Assistive Device Front wheel walker  Distance Ambulated (ft) 135 ft  RLE Weight Bearing TWB  Activity Response Tolerated fair  Mobility Referral Yes  $Mobility charge 1 Mobility  Mobility Specialist Start Time (ACUTE ONLY) 1515  Mobility Specialist Stop Time (ACUTE ONLY) 1535  Mobility Specialist Time Calculation (min) (ACUTE ONLY) 20 min   Pt agreeable to mobility with encouragement. Required MinG assistance during ambulation. RLE TWB throughout, regarding min verbal cues to maintain while ambulating. VSS throughout. Requesting pain meds at EOS. Pt left in chair with all needs met. RN in room changing bed linens.    Caesar Bookman Mobility Specialist Please contact via SecureChat or Rehab Office [phone]

## 2023-01-21 NOTE — Progress Notes (Signed)
PROGRESS NOTE        PATIENT DETAILS Name: Jonathan Ibarra Age: 57 y.o. Sex: male Date of Birth: 1965-09-24 Admit Date: 12/27/2022 Admitting Physician Osvaldo Shipper, MD [id number], [name], MD  Brief Summary: Patient is a 58 y.o.  male with history of liver cirrhosis, HTN, polysubstance abuse-recent hospitalization from 10/13-10/18 following trauma with fall resulting in a right tibia plateau fracture requiring ORIF on 10/14-presented with purulent discharge from his right knee-he was thought to have septic arthritis and subsequently admitted to the hospitalist service.  Significant events: 10/13-10/18 >> hospitalization-right tibial plateau fracture following a fall-s/p ORIF.   10/28>> purulent discharge from right knee-septic arthritis-admit to [facility] 10/30>> irrigation/debridement of right knee 11/11>> found to have purulence from right knee 11/13>> repeat irrigation/debridement  Significant studies: 08/02>> echo: EF 55-60% 10/28>> x-ray right knee: Unchanged lateral plate/screw fixation across comminuted fracture involving medial/lateral tibial plateaus, moderate knee joint effusion 10/29>> CXR: No active disease  Significant microbiology data: 10/29>> blood culture: 1/2-Corynebacterium (contamination) 10/30>> right knee abscess culture: Klebsiella pneumoniae-ESBL 10/31>> blood culture: No growth 11/13>> right knee abscess culture: No growth  Procedures: 10/30>> irrigation and debridement of right knee 11/13>> repeat irrigation/debridement.  Consults: Ortho  Infectious disease  Subjective:   Patient in bed, appears comfortable, denies any headache, no fever, no chest pain or pressure, no shortness of breath , no abdominal pain. No new focal weakness.   Objective: Vitals: Blood pressure 106/77, pulse 64, temperature (!) 97.5 F (36.4 C), temperature source Oral, resp. rate 20, height 6' (1.829 m), weight 117.9 kg, SpO2 98%.    Exam:  Awake Alert, No new F.N deficits, Normal affect [place].AT,PERRAL Supple Neck, No JVD,   Symmetrical Chest wall movement, Good air movement bilaterally, CTAB RRR,No Gallops, Rubs or new Murmurs,  +ve B.Sounds, Abd Soft, No tenderness,   Right leg under bandage    Assessment/Plan:  Septic arthritis right knee (ESBL Klebsiella pneumoniae)-with recent history of ORIF on 10/14 with hardware in place, being followed by orthopedics Dr. Jena Gauss and ID team. S/p I&D 10/30-VAC discontinued 11/4-unfortunately purulence was observed on 11/11 inspite of being on IV antibiotics-underwent repeat irrigation/debridement on 11/13 Cultures from 11/13 pending-but negative so far Remains on IV Invanz  Discussed with ID-11/15 Dr. Remi Deter weeks of IV Invanz from 11/13-possible suppression thereafter with PO Omadacycline thereafter.  Please reconsult infectious disease when closer to discharge.  Not a candidate for outpatient IV therapies given history of polysubstance abuse. Ortho following-WBAT as tolerated.  Note-patient asked to not change dressing by himself-he is at risk of recurrent infection-have discussed him with at length-and to let nursing staff do it with strict aseptic precautions.    PAF with RVR Rate controlled with oral metoprolol-continue midodrine for BP support Not a candidate for anticoagulation-in any events CHADS2-VASC score of 1  Liver cirrhosis-s/p TIPS Relatively well compensated Volume status relatively stable Continue lactulose. Diuretics on hold due to soft BP (on midodrine for BP support)  Polysubstance abuse Acknowledges ongoing meth/THC use and remote cocaine use-although UDS positive for cocaine Ongoing counseling  Obesity: Estimated body mass index is 35.26 kg/m as calculated from the following:   Height as of this encounter: 6' (1.829 m).   Weight as of this encounter: 117.9 kg.   Code status:   Code Status: Full Code   DVT  Prophylaxis: enoxaparin (LOVENOX) injection 40 mg Start: 12/30/22 0900  SCDs Start: 12/28/22 0816   Family Communication: None at bedside   Disposition Plan: Status is: Inpatient Remains inpatient appropriate because: Severity of illness   Planned Discharge Destination:Home   Diet: Diet Order             Diet regular Room service appropriate? Yes; Fluid consistency: Thin  Diet effective now                    MEDICATIONS: Scheduled Meds:  alteplase  2 mg Intracatheter Once   celecoxib  200 mg Oral BID   Chlorhexidine Gluconate Cloth  6 each Topical Daily   cholecalciferol  1,000 Units Oral Daily   enoxaparin (LOVENOX) injection  40 mg Subcutaneous Q24H   folic acid  1 mg Oral Daily   lactulose  30 g Oral TID   metoprolol tartrate  50 mg Oral BID   midodrine  10 mg Oral TID WC   polyethylene glycol  17 g Oral Daily   sodium chloride flush  10-40 mL Intracatheter Q12H   sodium chloride flush  3 mL Intravenous Q12H   thiamine  100 mg Oral Daily   Vitamin D (Ergocalciferol)  50,000 Units Oral Q7 days   Continuous Infusions:  ertapenem 1 g (01/21/23 0904)   PRN Meds:.acetaminophen, bisacodyl, diltiazem, HYDROmorphone (DILAUDID) injection, lactulose, methocarbamol, mouth rinse, [DISCONTINUED] oxyCODONE **OR** oxyCODONE, polyethylene glycol, sodium chloride flush, sodium chloride flush   I have personally reviewed following labs and imaging studies  LABORATORY DATA:  Recent Labs  Lab 01/16/23 0454 01/19/23 0409  WBC 4.8 4.9  HGB 10.3* 10.6*  HCT 32.8* 33.4*  PLT 188 199  MCV 93.2 91.8  MCH 29.3 29.1  MCHC 31.4 31.7  RDW 14.6 14.7    Recent Labs  Lab 01/16/23 0454 01/19/23 0409  NA 136 136  K 4.0 4.1  CL 104 102  CO2 26 28  ANIONGAP 6 6  GLUCOSE 100* 121*  BUN 13 13  CREATININE 0.68 0.60*  AST 27 23  ALT 19 17  ALKPHOS 112 130*  BILITOT 0.8 0.8  ALBUMIN 2.1* 2.2*  CRP 0.9 0.9  AMMONIA 25 54*  CALCIUM 8.5* 8.3*    No results found for:  "CHOL", "HDL", "LDLCALC", "LDLDIRECT", "TRIG", "CHOLHDL"    Recent Labs  Lab 01/16/23 0454 01/19/23 0409  CRP 0.9 0.9  AMMONIA 25 54*  CALCIUM 8.5* 8.3*       MICROBIOLOGY: Recent Results (from the past 240 hour(s))  Aerobic/Anaerobic Culture w Gram Stain (surgical/deep wound)     Status: None   Collection Time: 01/12/23  1:13 PM   Specimen: Path Tissue  Result Value Ref Range Status   Specimen Description TISSUE  Final   Special Requests RT KNEE A 1  Final   Gram Stain   Final    RARE WBC PRESENT, PREDOMINANTLY PMN NO ORGANISMS SEEN    Culture   Final    No growth aerobically or anaerobically. Performed at [facility], [address], [place], [place] [address]    Report Status 01/17/2023 FINAL  Final  Aerobic/Anaerobic Culture w Gram Stain (surgical/deep wound)     Status: None   Collection Time: 01/12/23  1:16 PM   Specimen: Path Tissue  Result Value Ref Range Status   Specimen Description TISSUE  Final   Special Requests RT KNEE B 2  Final   Gram Stain   Final    RARE WBC PRESENT, PREDOMINANTLY PMN NO ORGANISMS SEEN  Culture   Final    No growth aerobically or anaerobically. Performed at [facility], [address], [place], [place] [address]    Report Status 01/17/2023 FINAL  Final     RADIOLOGY STUDIES/RESULTS: DG Chest Port 1 View  Result Date: 01/20/2023 CLINICAL DATA:  PICC EXAM: PORTABLE CHEST 1 VIEW COMPARISON:  X-ray earlier 01/20/2023 and older. FINDINGS: Right-sided PICC with tip seen as far as the central SVC above the right atrium, advanced from previous. Film is rotated to the right. Enlarged cardiopericardial silhouette with interstitial changes and vascular prominence. No pneumothorax, effusion or consolidation. Density in the right upper abdomen at the edge of the imaging field. Please correlate for prior intervention or other process IMPRESSION: Interval advancement of the right-sided PICC with the tip now central SVC above the  right atrium Electronically Signed   By: Karen Kays M.D.   On: 01/20/2023 19:29   [place] EKG Site Rite  Result Date: 01/20/2023 If Site Rite image not attached, placement could not be confirmed due to current cardiac rhythm.  DG Chest Port 1 View  Result Date: 01/20/2023 CLINICAL DATA:  Right PICC placement EXAM: PORTABLE CHEST 1 VIEW COMPARISON:  12/28/2022 FINDINGS: Two frontal views of the chest as well as a frontal view centered over the right axilla was performed. Right-sided PICC is identified, tip overlying the confluence of the brachiocephalic veins. Cardiac silhouette remains enlarged. Continued central vascular congestion without evidence of airspace disease, effusion, or pneumothorax. No acute bony abnormalities. IMPRESSION: 1. Right upper extremity PICC, tip overlying the confluence of the brachiocephalic veins. Electronically Signed   By: Sharlet Salina M.D.   On: 01/20/2023 15:28     LOS: 24 days   Signature  -    Susa Raring M.D on 01/21/2023 at 10:11 AM   -  To page go to [email or web address]

## 2023-01-21 NOTE — Plan of Care (Signed)
  Problem: Education: Goal: Knowledge of disease or condition will improve Outcome: Progressing Goal: Understanding of medication regimen will improve Outcome: Progressing Goal: Individualized Educational Video(s) Outcome: Progressing   Problem: Health Behavior/Discharge Planning: Goal: Ability to safely manage health-related needs after discharge will improve Outcome: Progressing   Problem: Education: Goal: Knowledge of General Education information will improve Description: Including pain rating scale, medication(s)/side effects and non-pharmacologic comfort measures Outcome: Progressing   Problem: Health Behavior/Discharge Planning: Goal: Ability to manage health-related needs will improve Outcome: Progressing   Problem: Clinical Measurements: Goal: Ability to maintain clinical measurements within normal limits will improve Outcome: Progressing Goal: Diagnostic test results will improve Outcome: Progressing Goal: Respiratory complications will improve Outcome: Progressing   Problem: Elimination: Goal: Will not experience complications related to bowel motility Outcome: Progressing   Problem: Pain Management: Goal: General experience of comfort will improve Outcome: Progressing   Problem: Safety: Goal: Ability to remain free from injury will improve Outcome: Progressing   Problem: Skin Integrity: Goal: Risk for impaired skin integrity will decrease Outcome: Progressing

## 2023-01-22 DIAGNOSIS — M00861 Arthritis due to other bacteria, right knee: Secondary | ICD-10-CM | POA: Diagnosis not present

## 2023-01-22 LAB — COMPREHENSIVE METABOLIC PANEL
ALT: 22 U/L (ref 0–44)
AST: 31 U/L (ref 15–41)
Albumin: 2.2 g/dL — ABNORMAL LOW (ref 3.5–5.0)
Alkaline Phosphatase: 105 U/L (ref 38–126)
Anion gap: 5 (ref 5–15)
BUN: 16 mg/dL (ref 6–20)
CO2: 26 mmol/L (ref 22–32)
Calcium: 8.4 mg/dL — ABNORMAL LOW (ref 8.9–10.3)
Chloride: 106 mmol/L (ref 98–111)
Creatinine, Ser: 0.6 mg/dL — ABNORMAL LOW (ref 0.61–1.24)
GFR, Estimated: >60 mL/min (ref >60)
Glucose, Bld: 88 mg/dL (ref 70–99)
Potassium: 3.8 mmol/L (ref 3.5–5.1)
Sodium: 137 mmol/L (ref 135–145)
Total Bilirubin: 0.8 mg/dL (ref <1.2)
Total Protein: 5.6 g/dL — ABNORMAL LOW (ref 6.5–8.1)

## 2023-01-22 LAB — AMMONIA: Ammonia: 57 umol/L — ABNORMAL HIGH (ref 9–35)

## 2023-01-22 LAB — C-REACTIVE PROTEIN: CRP: 0.6 mg/dL (ref <1.0)

## 2023-01-22 NOTE — Progress Notes (Signed)
PROGRESS NOTE        PATIENT DETAILS Name: Jonathan Ibarra Age: 57 y.o. Sex: male Date of Birth: [birth year] Admit Date: 12/27/2022 Admitting Physician Osvaldo Shipper, MD [id number], [name], MD  Brief Summary: Patient is a 57 y.o.  male with history of liver cirrhosis, HTN, polysubstance abuse-recent hospitalization from 10/13-10/18 following trauma with fall resulting in a right tibia plateau fracture requiring ORIF on 10/14-presented with purulent discharge from his right knee-he was thought to have septic arthritis and subsequently admitted to the hospitalist service.  Significant events: 10/13-10/18 >> hospitalization-right tibial plateau fracture following a fall-s/p ORIF.   10/28>> purulent discharge from right knee-septic arthritis-admit to [facility] 10/30>> irrigation/debridement of right knee 11/11>> found to have purulence from right knee 11/13>> repeat irrigation/debridement  Significant studies: 08/02>> echo: EF 55-60% 10/28>> x-ray right knee: Unchanged lateral plate/screw fixation across comminuted fracture involving medial/lateral tibial plateaus, moderate knee joint effusion 10/29>> CXR: No active disease  Significant microbiology data: 10/29>> blood culture: 1/2-Corynebacterium (contamination) 10/30>> right knee abscess culture: Klebsiella pneumoniae-ESBL 10/31>> blood culture: No growth 11/13>> right knee abscess culture: No growth  Procedures: 10/30>> irrigation and debridement of right knee 11/13>> repeat irrigation/debridement.  Consults: Ortho  Infectious disease  Subjective:   Patient in bed, appears comfortable, denies any headache, no fever, no chest pain or pressure, no shortness of breath , no abdominal pain. No new focal weakness.   Objective: Vitals: Blood pressure 105/76, pulse 94, temperature 98.2 F (36.8 C), temperature source Oral, resp. rate 16, height 6' (1.829 m), weight 117.9 kg, SpO2 94%.   Exam:  Awake  Alert, No new F.N deficits, Normal affect [place].AT,PERRAL Supple Neck, No JVD,   Symmetrical Chest wall movement, Good air movement bilaterally, CTAB RRR,No Gallops, Rubs or new Murmurs,  +ve B.Sounds, Abd Soft, No tenderness,   Right leg under bandage    Assessment/Plan:  Septic arthritis right knee (ESBL Klebsiella pneumoniae)-with recent history of ORIF on 10/14 with hardware in place, being followed by orthopedics Dr. Jena Gauss and ID team. S/p I&D 10/30-VAC discontinued 11/4-unfortunately purulence was observed on 11/11 inspite of being on IV antibiotics-underwent repeat irrigation/debridement on 11/13 Cultures from 11/13 pending-but negative so far Remains on IV Invanz  Discussed with ID-11/15 Dr. Remi Deter weeks of IV Invanz from 11/13-possible suppression thereafter with PO Omadacycline thereafter.  Please reconsult infectious disease when closer to discharge.  Not a candidate for outpatient IV therapies given history of polysubstance abuse. Ortho following-WBAT as tolerated.  Note-patient asked to not change dressing by himself-he is at risk of recurrent infection-have discussed him with at length-and to let nursing staff do it with strict aseptic precautions.    PAF with RVR Rate controlled with oral metoprolol-continue midodrine for BP support Not a candidate for anticoagulation-in any events CHADS2-VASC score of 1  Liver cirrhosis-s/p TIPS Relatively well compensated Volume status relatively stable Continue lactulose. Diuretics on hold due to soft BP (on midodrine for BP support)  Polysubstance abuse Acknowledges ongoing meth/THC use and remote cocaine use-although UDS positive for cocaine Ongoing counseling  Obesity: Estimated body mass index is 35.26 kg/m as calculated from the following:   Height as of this encounter: 6' (1.829 m).   Weight as of this encounter: 117.9 kg.   Code status:   Code Status: Full Code   DVT Prophylaxis: enoxaparin (LOVENOX)  injection 40 mg Start: 12/30/22 0900 SCDs  Start: 12/28/22 0816   Family Communication: None at bedside   Disposition Plan: Status is: Inpatient Remains inpatient appropriate because: Severity of illness   Planned Discharge Destination:Home   Diet: Diet Order             Diet regular Room service appropriate? Yes; Fluid consistency: Thin  Diet effective now                    MEDICATIONS: Scheduled Meds:  alteplase  2 mg Intracatheter Once   celecoxib  200 mg Oral BID   Chlorhexidine Gluconate Cloth  6 each Topical Daily   cholecalciferol  1,000 Units Oral Daily   enoxaparin (LOVENOX) injection  40 mg Subcutaneous Q24H   folic acid  1 mg Oral Daily   lactulose  30 g Oral TID   metoprolol tartrate  50 mg Oral BID   midodrine  10 mg Oral TID WC   polyethylene glycol  17 g Oral Daily   sodium chloride flush  10-40 mL Intracatheter Q12H   sodium chloride flush  3 mL Intravenous Q12H   thiamine  100 mg Oral Daily   Vitamin D (Ergocalciferol)  50,000 Units Oral Q7 days   Continuous Infusions:  ertapenem 1 g (01/21/23 0904)   PRN Meds:.acetaminophen, bisacodyl, diltiazem, HYDROmorphone (DILAUDID) injection, lactulose, methocarbamol, mouth rinse, [DISCONTINUED] oxyCODONE **OR** oxyCODONE, polyethylene glycol, sodium chloride flush, sodium chloride flush   I have personally reviewed following labs and imaging studies  LABORATORY DATA:  Recent Labs  Lab 01/16/23 0454 01/19/23 0409  WBC 4.8 4.9  HGB 10.3* 10.6*  HCT 32.8* 33.4*  PLT 188 199  MCV 93.2 91.8  MCH 29.3 29.1  MCHC 31.4 31.7  RDW 14.6 14.7    Recent Labs  Lab 01/16/23 0454 01/19/23 0409 01/22/23 0500  NA 136 136 137  K 4.0 4.1 3.8  CL 104 102 106  CO2 26 28 26   ANIONGAP 6 6 5   GLUCOSE 100* 121* 88  BUN 13 13 16   CREATININE 0.68 0.60* 0.60*  AST 27 23 31   ALT 19 17 22   ALKPHOS 112 130* 105  BILITOT 0.8 0.8 0.8  ALBUMIN 2.1* 2.2* 2.2*  CRP 0.9 0.9 0.6  AMMONIA 25 54* 57*  CALCIUM  8.5* 8.3* 8.4*    No results found for: "CHOL", "HDL", "LDLCALC", "LDLDIRECT", "TRIG", "CHOLHDL"    Recent Labs  Lab 01/16/23 0454 01/19/23 0409 01/22/23 0500  CRP 0.9 0.9 0.6  AMMONIA 25 54* 57*  CALCIUM 8.5* 8.3* 8.4*       MICROBIOLOGY: Recent Results (from the past 240 hour(s))  Aerobic/Anaerobic Culture w Gram Stain (surgical/deep wound)     Status: None   Collection Time: 01/12/23  1:13 PM   Specimen: Path Tissue  Result Value Ref Range Status   Specimen Description TISSUE  Final   Special Requests RT KNEE A 1  Final   Gram Stain   Final    RARE WBC PRESENT, PREDOMINANTLY PMN NO ORGANISMS SEEN    Culture   Final    No growth aerobically or anaerobically. Performed at [facility], [address], [place], [place] [address]    Report Status 01/17/2023 FINAL  Final  Aerobic/Anaerobic Culture w Gram Stain (surgical/deep wound)     Status: None   Collection Time: 01/12/23  1:16 PM   Specimen: Path Tissue  Result Value Ref Range Status   Specimen Description TISSUE  Final   Special Requests RT KNEE B 2  Final   Gram Stain   Final    RARE WBC PRESENT, PREDOMINANTLY PMN NO ORGANISMS SEEN    Culture   Final    No growth aerobically or anaerobically. Performed at [facility], [address] [place], [place] [address]    Report Status 01/17/2023 FINAL  Final     RADIOLOGY STUDIES/RESULTS: DG Chest Port 1 View  Result Date: 01/20/2023 CLINICAL DATA:  PICC EXAM: PORTABLE CHEST 1 VIEW COMPARISON:  X-ray earlier 01/20/2023 and older. FINDINGS: Right-sided PICC with tip seen as far as the central SVC above the right atrium, advanced from previous. Film is rotated to the right. Enlarged cardiopericardial silhouette with interstitial changes and vascular prominence. No pneumothorax, effusion or consolidation. Density in the right upper abdomen at the edge of the imaging field. Please correlate for prior intervention or other process IMPRESSION: Interval  advancement of the right-sided PICC with the tip now central SVC above the right atrium Electronically Signed   By: Karen Kays M.D.   On: 01/20/2023 19:29   [place] EKG Site Rite  Result Date: 01/20/2023 If Site Rite image not attached, placement could not be confirmed due to current cardiac rhythm.  DG Chest Port 1 View  Result Date: 01/20/2023 CLINICAL DATA:  Right PICC placement EXAM: PORTABLE CHEST 1 VIEW COMPARISON:  12/28/2022 FINDINGS: Two frontal views of the chest as well as a frontal view centered over the right axilla was performed. Right-sided PICC is identified, tip overlying the confluence of the brachiocephalic veins. Cardiac silhouette remains enlarged. Continued central vascular congestion without evidence of airspace disease, effusion, or pneumothorax. No acute bony abnormalities. IMPRESSION: 1. Right upper extremity PICC, tip overlying the confluence of the brachiocephalic veins. Electronically Signed   By: Sharlet Salina M.D.   On: 01/20/2023 15:28     LOS: 25 days   Signature  -    Susa Raring M.D on 01/22/2023 at 9:07 AM   -  To page go to [email or web address]

## 2023-01-22 NOTE — Plan of Care (Signed)
Problem: Education: Goal: Knowledge of General Education information will improve Description: Including pain rating scale, medication(s)/side effects and non-pharmacologic comfort measures Outcome: Progressing   Problem: Health Behavior/Discharge Planning: Goal: Ability to manage health-related needs will improve Outcome: Progressing   Problem: Pain Management: Goal: General experience of comfort will improve Outcome: Progressing   Problem: Safety: Goal: Ability to remain free from injury will improve Outcome: Progressing   Problem: Skin Integrity: Goal: Risk for impaired skin integrity will decrease Outcome: Progressing

## 2023-01-23 DIAGNOSIS — M00861 Arthritis due to other bacteria, right knee: Secondary | ICD-10-CM | POA: Diagnosis not present

## 2023-01-23 MED ORDER — ENOXAPARIN SODIUM 60 MG/0.6ML IJ SOSY
60.0000 mg | PREFILLED_SYRINGE | INTRAMUSCULAR | Status: DC
  Administered 2023-01-24 – 2023-02-04 (×12): 60 mg via SUBCUTANEOUS
  Filled 2023-01-23 (×12): qty 0.6

## 2023-01-23 NOTE — Progress Notes (Signed)
PROGRESS NOTE        PATIENT DETAILS Name: Jonathan Ibarra Age: 57 y.o. Sex: male Date of Birth: 06-Mar-1965 Admit Date: 12/27/2022 Admitting Physician Osvaldo Shipper, MD [id number], [name], MD  Brief Summary: Patient is a 57 y.o.  male with history of liver cirrhosis, HTN, polysubstance abuse-recent hospitalization from 10/13-10/18 following trauma with fall resulting in a right tibia plateau fracture requiring ORIF on 10/14-presented with purulent discharge from his right knee-he was thought to have septic arthritis and subsequently admitted to the hospitalist service.  Significant events: 10/13-10/18 >> hospitalization-right tibial plateau fracture following a fall-s/p ORIF.   10/28>> purulent discharge from right knee-septic arthritis-admit to [facility] 10/30>> irrigation/debridement of right knee 11/11>> found to have purulence from right knee 11/13>> repeat irrigation/debridement  Significant studies: 08/02>> echo: EF 55-60% 10/28>> x-ray right knee: Unchanged lateral plate/screw fixation across comminuted fracture involving medial/lateral tibial plateaus, moderate knee joint effusion 10/29>> CXR: No active disease  Significant microbiology data: 10/29>> blood culture: 1/2-Corynebacterium (contamination) 10/30>> right knee abscess culture: Klebsiella pneumoniae-ESBL 10/31>> blood culture: No growth 11/13>> right knee abscess culture: No growth  Procedures: 10/30>> irrigation and debridement of right knee 11/13>> repeat irrigation/debridement.  Consults: Ortho  Infectious disease  Subjective:   Patient in bed, appears comfortable, denies any headache, no fever, no chest pain or pressure, no shortness of breath , no abdominal pain. No new focal weakness.   Objective: Vitals: Blood pressure 121/75, pulse 98, temperature 98.1 F (36.7 C), temperature source Oral, resp. rate 20, height 6' (1.829 m), weight 117.9 kg, SpO2 94%.   Exam:  Awake  Alert, No new F.N deficits, Normal affect [place].AT,PERRAL Supple Neck, No JVD,   Symmetrical Chest wall movement, Good air movement bilaterally, CTAB RRR,No Gallops, Rubs or new Murmurs,  +ve B.Sounds, Abd Soft, No tenderness,   Right leg under bandage    Assessment/Plan:  Septic arthritis right knee (ESBL Klebsiella pneumoniae)-with recent history of ORIF on 10/14 with hardware in place, being followed by orthopedics Dr. Jena Gauss and ID team. S/p I&D 10/30-VAC discontinued 11/4-unfortunately purulence was observed on 11/11 inspite of being on IV antibiotics-underwent repeat irrigation/debridement on 11/13 Cultures from 11/13 pending-but negative so far Remains on IV Invanz  Discussed with ID-11/15 Dr. Remi Deter weeks of IV Invanz from 11/13-possible suppression thereafter with PO Omadacycline thereafter.  Please reconsult infectious disease when closer to discharge.  Not a candidate for outpatient IV therapies given history of polysubstance abuse. Ortho following-WBAT as tolerated.  Note-patient asked to not change dressing by himself-he is at risk of recurrent infection-have discussed him with at length-and to let nursing staff do it with strict aseptic precautions.    PAF with RVR Rate controlled with oral metoprolol-continue midodrine for BP support Not a candidate for anticoagulation-in any events CHADS2-VASC score of 1  Liver cirrhosis-s/p TIPS Relatively well compensated Volume status relatively stable Continue lactulose. Diuretics on hold due to soft BP (on midodrine for BP support)  Polysubstance abuse Acknowledges ongoing meth/THC use and remote cocaine use-although UDS positive for cocaine Ongoing counseling  Obesity: Estimated body mass index is 35.26 kg/m as calculated from the following:   Height as of this encounter: 6' (1.829 m).   Weight as of this encounter: 117.9 kg.   Code status:   Code Status: Full Code   DVT Prophylaxis: enoxaparin (LOVENOX)  injection 40 mg Start: 12/30/22 0900 SCDs  Start: 12/28/22 0816   Family Communication: None at bedside   Disposition Plan: Status is: Inpatient Remains inpatient appropriate because: Severity of illness   Planned Discharge Destination:Home   Diet: Diet Order             Diet regular Room service appropriate? Yes; Fluid consistency: Thin  Diet effective now                    MEDICATIONS: Scheduled Meds:  alteplase  2 mg Intracatheter Once   celecoxib  200 mg Oral BID   Chlorhexidine Gluconate Cloth  6 each Topical Daily   cholecalciferol  1,000 Units Oral Daily   enoxaparin (LOVENOX) injection  40 mg Subcutaneous Q24H   folic acid  1 mg Oral Daily   lactulose  30 g Oral TID   metoprolol tartrate  50 mg Oral BID   midodrine  10 mg Oral TID WC   polyethylene glycol  17 g Oral Daily   sodium chloride flush  10-40 mL Intracatheter Q12H   sodium chloride flush  3 mL Intravenous Q12H   thiamine  100 mg Oral Daily   Vitamin D (Ergocalciferol)  50,000 Units Oral Q7 days   Continuous Infusions:  ertapenem 1 g (01/23/23 0914)   PRN Meds:.acetaminophen, bisacodyl, diltiazem, HYDROmorphone (DILAUDID) injection, lactulose, methocarbamol, mouth rinse, [DISCONTINUED] oxyCODONE **OR** oxyCODONE, polyethylene glycol, sodium chloride flush, sodium chloride flush   I have personally reviewed following labs and imaging studies  LABORATORY DATA:  Recent Labs  Lab 01/19/23 0409  WBC 4.9  HGB 10.6*  HCT 33.4*  PLT 199  MCV 91.8  MCH 29.1  MCHC 31.7  RDW 14.7    Recent Labs  Lab 01/19/23 0409 01/22/23 0500  NA 136 137  K 4.1 3.8  CL 102 106  CO2 28 26  ANIONGAP 6 5  GLUCOSE 121* 88  BUN 13 16  CREATININE 0.60* 0.60*  AST 23 31  ALT 17 22  ALKPHOS 130* 105  BILITOT 0.8 0.8  ALBUMIN 2.2* 2.2*  CRP 0.9 0.6  AMMONIA 54* 57*  CALCIUM 8.3* 8.4*    No results found for: "CHOL", "HDL", "LDLCALC", "LDLDIRECT", "TRIG", "CHOLHDL"    Recent Labs  Lab  16/10/96 0409 01/22/23 0500  CRP 0.9 0.6  AMMONIA 54* 57*  CALCIUM 8.3* 8.4*       MICROBIOLOGY: No results found for this or any previous visit (from the past 240 hour(s)).    RADIOLOGY STUDIES/RESULTS: No results found.   LOS: 26 days   Signature  -    Susa Raring M.D on 01/23/2023 at 9:51 AM   -  To page go to [email or web address]

## 2023-01-23 NOTE — Progress Notes (Signed)
Mobility Specialist Progress Note    01/23/23 1440  Mobility  Activity Refused mobility   Pt stated "football only comes on once a week". Will f/u as schedule permits.   [place] Mobility Specialist  Please Neurosurgeon or Rehab Office at [phone]

## 2023-01-23 NOTE — Plan of Care (Signed)
  Problem: Education: Goal: Understanding of medication regimen will improve Outcome: Progressing   Problem: Health Behavior/Discharge Planning: Goal: Ability to manage health-related needs will improve Outcome: Progressing   Problem: Pain Management: Goal: General experience of comfort will improve Outcome: Progressing   Problem: Skin Integrity: Goal: Risk for impaired skin integrity will decrease Outcome: Progressing

## 2023-01-24 DIAGNOSIS — M00861 Arthritis due to other bacteria, right knee: Secondary | ICD-10-CM | POA: Diagnosis not present

## 2023-01-24 NOTE — Progress Notes (Signed)
PROGRESS NOTE        PATIENT DETAILS Name: Jonathan Ibarra Age: 57 y.o. Sex: male Date of Birth: 1965-06-01 Admit Date: 12/27/2022 Admitting Physician Osvaldo Shipper, MD [id number], [name], MD  Brief Summary: Patient is a 57 y.o.  male with history of liver cirrhosis, HTN, polysubstance abuse-recent hospitalization from 10/13-10/18 following trauma with fall resulting in a right tibia plateau fracture requiring ORIF on 10/14-presented with purulent discharge from his right knee-he was thought to have septic arthritis and subsequently admitted to the hospitalist service.  Significant events: 10/13-10/18 >> hospitalization-right tibial plateau fracture following a fall-s/p ORIF.   10/28>> purulent discharge from right knee-septic arthritis-admit to [facility] 10/30>> irrigation/debridement of right knee 11/11>> found to have purulence from right knee 11/13>> repeat irrigation/debridement  Significant studies: 08/02>> echo: EF 55-60% 10/28>> x-ray right knee: Unchanged lateral plate/screw fixation across comminuted fracture involving medial/lateral tibial plateaus, moderate knee joint effusion 10/29>> CXR: No active disease  Significant microbiology data: 10/29>> blood culture: 1/2-Corynebacterium (contamination) 10/30>> right knee abscess culture: Klebsiella pneumoniae-ESBL 10/31>> blood culture: No growth 11/13>> right knee abscess culture: No growth  Procedures: 10/30>> irrigation and debridement of right knee 11/13>> repeat irrigation/debridement.  Consults: Ortho  Infectious disease  Subjective:   Patient in bed, appears comfortable, denies any headache, no fever, no chest pain or pressure, no shortness of breath , no abdominal pain. No new focal weakness.   Objective: Vitals: Blood pressure 107/70, pulse 67, temperature 98.1 F (36.7 C), temperature source Oral, resp. rate 19, height 6' (1.829 m), weight 117.9 kg, SpO2 99%.   Exam:  Awake  Alert, No new F.N deficits, Normal affect [place].AT,PERRAL Supple Neck, No JVD,   Symmetrical Chest wall movement, Good air movement bilaterally, CTAB RRR,No Gallops, Rubs or new Murmurs,  +ve B.Sounds, Abd Soft, No tenderness,   Right leg under bandage    Assessment/Plan:  Septic arthritis right knee (ESBL Klebsiella pneumoniae)-with recent history of ORIF on 10/14 with hardware in place, being followed by orthopedics Dr. Jena Gauss and ID team. S/p I&D 10/30-VAC discontinued 11/4-unfortunately purulence was observed on 11/11 inspite of being on IV antibiotics-underwent repeat irrigation/debridement on 11/13 Cultures from 11/13 pending-but negative so far Remains on IV Invanz  Discussed with ID-11/15 Dr. Remi Deter weeks of IV Invanz from 11/13-possible suppression thereafter with PO Omadacycline thereafter.  Please reconsult infectious disease when closer to discharge.  Not a candidate for outpatient IV therapies given history of polysubstance abuse. Ortho following-WBAT as tolerated.  Note-patient asked to not change dressing by himself-he is at risk of recurrent infection-have discussed him with at length-and to let nursing staff do it with strict aseptic precautions.   PAF with RVR Rate controlled with oral metoprolol-continue midodrine for BP support Not a candidate for anticoagulation-in any events CHADS2-VASC score of 1  Liver cirrhosis-s/p TIPS Relatively well compensated Volume status relatively stable Continue lactulose. Diuretics on hold due to soft BP (on midodrine for BP support)  Polysubstance abuse Acknowledges ongoing meth/THC use and remote cocaine use-although UDS positive for cocaine Ongoing counseling  Obesity: Estimated body mass index is 35.26 kg/m as calculated from the following:   Height as of this encounter: 6' (1.829 m).   Weight as of this encounter: 117.9 kg.   Code status:   Code Status: Full Code   DVT Prophylaxis: SCDs Start: 12/28/22 0816    Family Communication: None at  bedside   Disposition Plan: Status is: Inpatient Remains inpatient appropriate because: Severity of illness   Planned Discharge Destination:Home   Diet: Diet Order             Diet regular Room service appropriate? Yes; Fluid consistency: Thin  Diet effective now                    MEDICATIONS: Scheduled Meds:  alteplase  2 mg Intracatheter Once   celecoxib  200 mg Oral BID   Chlorhexidine Gluconate Cloth  6 each Topical Daily   enoxaparin (LOVENOX) injection  60 mg Subcutaneous Q24H   folic acid  1 mg Oral Daily   lactulose  30 g Oral TID   metoprolol tartrate  50 mg Oral BID   midodrine  10 mg Oral TID WC   polyethylene glycol  17 g Oral Daily   sodium chloride flush  10-40 mL Intracatheter Q12H   sodium chloride flush  3 mL Intravenous Q12H   thiamine  100 mg Oral Daily   Vitamin D (Ergocalciferol)  50,000 Units Oral Q7 days   Continuous Infusions:  ertapenem 1 g (01/23/23 0914)   PRN Meds:.acetaminophen, bisacodyl, diltiazem, HYDROmorphone (DILAUDID) injection, lactulose, methocarbamol, mouth rinse, [DISCONTINUED] oxyCODONE **OR** oxyCODONE, polyethylene glycol, sodium chloride flush, sodium chloride flush   I have personally reviewed following labs and imaging studies  LABORATORY DATA:  Recent Labs  Lab 01/19/23 0409  WBC 4.9  HGB 10.6*  HCT 33.4*  PLT 199  MCV 91.8  MCH 29.1  MCHC 31.7  RDW 14.7    Recent Labs  Lab 01/19/23 0409 01/22/23 0500  NA 136 137  K 4.1 3.8  CL 102 106  CO2 28 26  ANIONGAP 6 5  GLUCOSE 121* 88  BUN 13 16  CREATININE 0.60* 0.60*  AST 23 31  ALT 17 22  ALKPHOS 130* 105  BILITOT 0.8 0.8  ALBUMIN 2.2* 2.2*  CRP 0.9 0.6  AMMONIA 54* 57*  CALCIUM 8.3* 8.4*    No results found for: "CHOL", "HDL", "LDLCALC", "LDLDIRECT", "TRIG", "CHOLHDL"    Recent Labs  Lab 01/19/23 0409 01/22/23 0500  CRP 0.9 0.6  AMMONIA 54* 57*  CALCIUM 8.3* 8.4*     MICROBIOLOGY: No results  found for this or any previous visit (from the past 240 hour(s)).  RADIOLOGY STUDIES/RESULTS: No results found.   LOS: 27 days   Signature  -    Susa Raring M.D on 01/24/2023 at 7:41 AM   -  To page go to [email or web address]

## 2023-01-24 NOTE — Progress Notes (Signed)
Physical Therapy Treatment Patient Details Name: Jonathan Ibarra MRN: [id number] DOB: 1965-08-30 Today's Date: 01/24/2023   History of Present Illness Pt is a 57 y/o male presenting 10/28 to [facility] with severe right knee pain and drainage after a fall the night PTA, he is now s/p R knee I&D on 12/29/22 and 01/12/23. He recently had a R tibial ORIF on 12/13/22 after a falls incidence. HQI:ONGEXBM abuse, [name], HTN, liver cirrhosis, polysubstance abuse.    PT Comments  Pt had already walked in hallway earlier so focused on strengthening and ROM of RLE. Concentrated on good quad contraction. Pt is mobilizing fairly well with mobility, nursing, etc. Continue to follow to maximize independence.     If plan is discharge home, recommend the following: [name];Assist for transportation;Help with stairs or ramp for entrance   Can travel by private vehicle        Equipment Recommendations  None recommended by PT    Recommendations for Other Services       Precautions / Restrictions Precautions Precautions: Fall Restrictions Weight Bearing Restrictions: Yes RLE Weight Bearing: Touchdown weight bearing     Mobility  Bed Mobility Overal bed mobility: Modified Independent                  Transfers                        Ambulation/Gait                   Stairs             Wheelchair Mobility     Tilt Bed    Modified Rankin (Stroke Patients Only)       Balance                                            Cognition Arousal: Alert Behavior During Therapy: WFL for tasks assessed/performed, Agitated Overall Cognitive Status: Within Functional Limits for tasks assessed                                          Exercises General Exercises - Lower Extremity Quad Sets: AROM, Strengthening, Right, 10 reps, Supine Heel Slides: AROM, Right, 10 reps, Supine Hip  ABduction/ADduction: AAROM, Right, 10 reps, Supine Straight Leg Raises: AAROM, Strengthening, Right, 10 reps    General Comments        Pertinent Vitals/Pain Pain Assessment Pain Assessment: Faces Faces Pain Scale: Hurts little more Pain Location: R knee Pain Descriptors / Indicators: Discomfort, Aching Pain Intervention(s): Limited activity within patient's tolerance    Home Living                          Prior Function            PT Goals (current goals can now be found in the care plan section) Progress towards PT goals: Progressing toward goals    Frequency    Min 1X/week      PT Plan      Co-evaluation              AM-PAC PT "6 Clicks" Mobility   Outcome Measure  Help needed turning from your  back to your side while in a flat bed without using bedrails?: None Help needed moving from lying on your back to sitting on the side of a flat bed without using bedrails?: None Help needed moving to and from a bed to a chair (including a wheelchair)?: A Little Help needed standing up from a chair using your arms (e.g., wheelchair or bedside chair)?: A Little Help needed to walk in hospital room?: A Little Help needed climbing 3-5 steps with a railing? : Total 6 Click Score: 18    End of Session   Activity Tolerance: Patient tolerated treatment well Patient left: in bed;with call bell/phone within reach   PT Visit Diagnosis: Muscle weakness (generalized) (M62.81);Other abnormalities of gait and mobility (R26.89);Pain Pain - Right/Left: Right Pain - part of body: Knee     Time: 5621-3086 PT Time Calculation (min) (ACUTE ONLY): 11 min  Charges:    $Therapeutic Exercise: 8-22 mins PT General Charges $$ ACUTE PT VISIT: 1 Visit                     [facility] PT Acute Rehabilitation Services Office [phone]    [facility] 01/24/2023, 4:55 PM

## 2023-01-24 NOTE — Progress Notes (Signed)
Mobility Specialist Progress Note;   01/24/23 1030  Mobility  Activity Ambulated with assistance in hallway  Level of Assistance Contact guard assist, steadying assist  Assistive Device Front wheel walker  Distance Ambulated (ft) 100 ft  RLE Weight Bearing WBAT  Activity Response Tolerated well  Mobility Referral Yes  $Mobility charge 1 Mobility  Mobility Specialist Start Time (ACUTE ONLY) 1030  Mobility Specialist Stop Time (ACUTE ONLY) 1045  Mobility Specialist Time Calculation (min) (ACUTE ONLY) 15 min   Pt agreeable to mobility. Received pain meds prior to session. Required minG assistance during ambulation. Min verbal cues required to stay TWB during ambulation. VSS throughout. Pt returned back to EOB with all needs met.   Caesar Bookman Mobility Specialist Please contact via SecureChat or Rehab Office [phone]

## 2023-01-24 NOTE — Plan of Care (Signed)
  Problem: Health Behavior/Discharge Planning: Goal: Ability to safely manage health-related needs after discharge will improve Outcome: Progressing   Problem: Pain Management: Goal: General experience of comfort will improve Outcome: Progressing   Problem: Skin Integrity: Goal: Risk for impaired skin integrity will decrease Outcome: Progressing

## 2023-01-25 DIAGNOSIS — M00861 Arthritis due to other bacteria, right knee: Secondary | ICD-10-CM | POA: Diagnosis not present

## 2023-01-25 LAB — COMPREHENSIVE METABOLIC PANEL
ALT: 32 U/L (ref 0–44)
AST: 45 U/L — ABNORMAL HIGH (ref 15–41)
Albumin: 2.3 g/dL — ABNORMAL LOW (ref 3.5–5.0)
Alkaline Phosphatase: 124 U/L (ref 38–126)
Anion gap: 5 (ref 5–15)
BUN: 22 mg/dL — ABNORMAL HIGH (ref 6–20)
CO2: 24 mmol/L (ref 22–32)
Calcium: 8.5 mg/dL — ABNORMAL LOW (ref 8.9–10.3)
Chloride: 107 mmol/L (ref 98–111)
Creatinine, Ser: 0.7 mg/dL (ref 0.61–1.24)
GFR, Estimated: >60 mL/min (ref >60)
Glucose, Bld: 128 mg/dL — ABNORMAL HIGH (ref 70–99)
Potassium: 4.1 mmol/L (ref 3.5–5.1)
Sodium: 136 mmol/L (ref 135–145)
Total Bilirubin: 0.8 mg/dL (ref <1.2)
Total Protein: 5.9 g/dL — ABNORMAL LOW (ref 6.5–8.1)

## 2023-01-25 LAB — CBC
HCT: 33.5 % — ABNORMAL LOW (ref 39.0–52.0)
Hemoglobin: 10.3 g/dL — ABNORMAL LOW (ref 13.0–17.0)
MCH: 29.3 pg (ref 26.0–34.0)
MCHC: 30.7 g/dL (ref 30.0–36.0)
MCV: 95.4 fL (ref 80.0–100.0)
Platelets: 127 10*3/uL — ABNORMAL LOW (ref 150–400)
RBC: 3.51 MIL/uL — ABNORMAL LOW (ref 4.22–5.81)
RDW: 14.7 % (ref 11.5–15.5)
WBC: 3.4 10*3/uL — ABNORMAL LOW (ref 4.0–10.5)
nRBC: 0 % (ref 0.0–0.2)

## 2023-01-25 LAB — C-REACTIVE PROTEIN: CRP: 0.7 mg/dL (ref <1.0)

## 2023-01-25 LAB — AMMONIA: Ammonia: 59 umol/L — ABNORMAL HIGH (ref 9–35)

## 2023-01-25 MED ORDER — ADULT MULTIVITAMIN W/MINERALS CH
1.0000 | ORAL_TABLET | Freq: Every day | ORAL | Status: DC
  Administered 2023-01-25 – 2023-02-04 (×11): 1 via ORAL
  Filled 2023-01-25 (×11): qty 1

## 2023-01-25 NOTE — Progress Notes (Signed)
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  Sensitivities Klebsiella sensitive to minocycline - Taken to the OR on 10/30 with cultures growing ESBL Kaleab cellular - Repeat I&D on 11/13 due to purulence at incision site on 11/11.  Or findings showed purulent and fibrinous material around hardware.  Or cultures no growth. Plan: - Continue ertapenem,  reset the clock from last I&D on 11/13( 4-6 weeks pending clinical progression). 4 weeks is 12/10 - Plan on suppressive omadacycline - Monitor clinically Microbiology:   Antibiotics: Ertapenem Other   SUBJECTIVE: Resting in bed.  No new complaints. Resting pain Interval: Afebrile overnight.   Review of Systems: Review of Systems  All other systems reviewed and are negative.    Scheduled Meds:  alteplase  2 mg Intracatheter Once   celecoxib  200 mg Oral BID   Chlorhexidine Gluconate Cloth  6 each Topical Daily   enoxaparin (LOVENOX) injection  60 mg Subcutaneous Q24H   folic acid  1 mg Oral Daily   lactulose  30 g Oral TID   metoprolol tartrate  50 mg Oral BID   midodrine  10 mg Oral TID WC   multivitamin with minerals  1 tablet Oral Daily   polyethylene glycol  17 g Oral Daily   sodium chloride flush  10-40 mL Intracatheter Q12H   sodium chloride flush  3 mL Intravenous Q12H   thiamine  100 mg Oral Daily   Vitamin D (Ergocalciferol)  50,000  Units Oral Q7 days   Continuous Infusions:  ertapenem 1 g (01/25/23 1012)   PRN Meds:.acetaminophen, bisacodyl, diltiazem, HYDROmorphone (DILAUDID) injection, lactulose, methocarbamol, mouth rinse, [DISCONTINUED] oxyCODONE **OR** oxyCODONE, polyethylene glycol, sodium chloride flush, sodium chloride flush Allergies  Allergen Reactions   Tylenol [Acetaminophen] Other (See Comments)    Impacts liver    OBJECTIVE: Vitals:   01/25/23 0410 01/25/23 0812 01/25/23 1007 01/25/23 1251  BP: 95/70 105/64 105/64 91/62  Pulse: 95 99 99 78  Resp: 18 20  20   Temp: 98.2 F (36.8 C) 98.1 F (36.7 C)  99.2 F (37.3 C)  TempSrc: Axillary Oral  Oral  SpO2: 95% 93%  97%  Weight:      Height:       Body mass index is 35.26 kg/m.  Physical Exam Constitutional:      General: He is not in acute distress.    Appearance: He is normal weight. He is not toxic-appearing.  HENT:     Head: Normocephalic and atraumatic.     Right Ear: External ear normal.     Left Ear: External ear normal.     Nose: No congestion or rhinorrhea.     Mouth/Throat:     Mouth: Mucous membranes are moist.     Pharynx: Oropharynx is clear.  Eyes:     Extraocular Movements: Extraocular movements intact.     Conjunctiva/sclera: Conjunctivae normal.     Pupils: Pupils are equal,  round, and reactive to light.  Cardiovascular:     Rate and Rhythm: Normal rate and regular rhythm.     Heart sounds: No murmur heard.    No friction rub. No gallop.  Pulmonary:     Effort: Pulmonary effort is normal.     Breath sounds: Normal breath sounds.  Abdominal:     General: Abdomen is flat. Bowel sounds are normal.     Palpations: Abdomen is soft.  Musculoskeletal:        General: No swelling.     Cervical back: Normal range of motion and neck supple.     Comments: Right leg wound bandaged  Skin:    General: Skin is warm and dry.  Neurological:     General: No focal deficit present.     Mental Status: He is oriented to person,  place, and time.  Psychiatric:        Mood and Affect: Mood normal.       Lab Results Lab Results  Component Value Date   WBC 3.4 (L) 01/25/2023   HGB 10.3 (L) 01/25/2023   HCT 33.5 (L) 01/25/2023   MCV 95.4 01/25/2023   PLT 127 (L) 01/25/2023    Lab Results  Component Value Date   CREATININE 0.70 01/25/2023   BUN 22 (H) 01/25/2023   NA 136 01/25/2023   K 4.1 01/25/2023   CL 107 01/25/2023   CO2 24 01/25/2023    Lab Results  Component Value Date   ALT 32 01/25/2023   AST 45 (H) 01/25/2023   ALKPHOS 124 01/25/2023   BILITOT 0.8 01/25/2023        Danelle Earthly, MD [facility] 01/25/2023, 2:26 PM  I have personally spent 35 minutes involved in face-to-face and non-face-to-face activities for this patient on the day of the visit. Professional time spent includes the following activities: Preparing to see the patient (review of tests), Obtaining and/or reviewing separately obtained history (admission/discharge record), Performing a medically appropriate examination and/or evaluation , Ordering medications/tests/procedures, referring and communicating with other health care professionals, Documenting clinical information in the EMR, Independently interpreting results (not separately reported), Communicating results to the patient/family/caregiver, Counseling and educating the patient/family/caregiver and Care coordination (not separately reported).

## 2023-01-25 NOTE — Progress Notes (Signed)
Mobility Specialist Progress Note;   01/25/23 1455  Mobility  Activity Refused mobility  Mobility Specialist Start Time (ACUTE ONLY) 1455  Mobility Specialist Stop Time (ACUTE ONLY) 1505  Mobility Specialist Time Calculation (min) (ACUTE ONLY) 10 min   Pt refusing mobility w/ max encouragement. Stated he has already been up with someone today (OT) and that we as rehab need to get on the same page. Will f/u as schedule permits.   Caesar Bookman Mobility Specialist Please contact via SecureChat or Rehab Office [phone]

## 2023-01-25 NOTE — Plan of Care (Signed)
  Problem: Education: Goal: Knowledge of disease or condition will improve Outcome: Progressing Goal: Understanding of medication regimen will improve Outcome: Progressing Goal: Individualized Educational Video(s) Outcome: Progressing   Problem: Health Behavior/Discharge Planning: Goal: Ability to safely manage health-related needs after discharge will improve Outcome: Progressing   Problem: Education: Goal: Knowledge of General Education information will improve Description: Including pain rating scale, medication(s)/side effects and non-pharmacologic comfort measures Outcome: Progressing   Problem: Health Behavior/Discharge Planning: Goal: Ability to manage health-related needs will improve Outcome: Progressing   Problem: Clinical Measurements: Goal: Ability to maintain clinical measurements within normal limits will improve Outcome: Progressing Goal: Diagnostic test results will improve Outcome: Progressing Goal: Respiratory complications will improve Outcome: Progressing   Problem: Coping: Goal: Level of anxiety will decrease Outcome: Progressing   Problem: Elimination: Goal: Will not experience complications related to bowel motility Outcome: Progressing   Problem: Pain Management: Goal: General experience of comfort will improve Outcome: Progressing   Problem: Safety: Goal: Ability to remain free from injury will improve Outcome: Progressing   Problem: Skin Integrity: Goal: Risk for impaired skin integrity will decrease Outcome: Progressing

## 2023-01-25 NOTE — Progress Notes (Signed)
Initial Nutrition Assessment  DOCUMENTATION CODES:   Obesity unspecified  INTERVENTION:  Continue wit regular diet Multivitamin with minerals   NUTRITION DIAGNOSIS:   Increased nutrient needs related to chronic illness as evidenced by estimated needs.     GOAL:   Patient will meet greater than or equal to 90% of their needs    MONITOR:   PO intake, Supplement acceptance, Weight trends, Skin  REASON FOR ASSESSMENT:   LOS    ASSESSMENT:   57 y.o. M, admitted for septic arthritis of right knee; past medical history of liver cirrhosis, HTN, polysubstance abuse-recent hospitalization from 10/13-10/18 following trauma with fall resulting in a right tibia plateau fracture requiring ORF on 10/14.  Patient stated that he has had not appetite changes or weight loss. Revealed no dietary concerns at this time.    Significant events: 10/13-10/18 >> hospitalization-right tibial plateau fracture following a fall-s/p ORIF.   10/28>> purulent discharge from right knee-septic arthritis-admit to [facility] 10/30>> irrigation/debridement of right knee 11/11>> found to have purulence from right knee 11/13>> repeat irrigation/debridement Admit weight: 117.9 kg   Weight history; 01/12/23 117.9 kg  12/17/22 123.6 kg  10/08/22 124.1 kg      Average Meal Intake:  100% intake x 2 recorded meals  Nutritionally Relevant Medications: Scheduled Meds:  lactulose  30 g Oral TID   thiamine  100 mg Oral Daily   Vitamin D (Ergocalciferol)  50,000 Units Oral Q7 days    Labs Reviewed    NUTRITION - FOCUSED PHYSICAL EXAM:  Flowsheet Row Most Recent Value  Orbital Region No depletion  Upper Arm Region No depletion  Buccal Region No depletion  Temple Region No depletion  Clavicle Bone Region No depletion  Clavicle and Acromion Bone Region No depletion  Scapular Bone Region No depletion  Dorsal Hand No depletion  Patellar Region No depletion  Anterior Thigh Region No depletion   Posterior Calf Region No depletion  Edema (RD Assessment) Mild  Hair Reviewed  Eyes Reviewed  Mouth Reviewed  Skin Reviewed  Nails Reviewed       Diet Order:   Diet Order             Diet regular Room service appropriate? Yes; Fluid consistency: Thin  Diet effective now                   EDUCATION NEEDS:   Education needs have been addressed  Skin:  Skin Assessment: Reviewed RN Assessment  Last BM:  11/25  Height:   Ht Readings from Last 1 Encounters:  01/12/23 6' (1.829 m)    Weight:   Wt Readings from Last 1 Encounters:  01/12/23 117.9 kg    Ideal Body Weight:     BMI:  Body mass index is 35.26 kg/m.  Estimated Nutritional Needs:   Kcal:  2550- 2850 kcal/d  Protein:  105-120 g/d  Fluid:  62ml/kcal    Jamelle Haring RDN, LDN Clinical Dietitian  RDN pager # available on Amion .

## 2023-01-25 NOTE — Progress Notes (Addendum)
Occupational Therapy Treatment Patient Details Name: Jonathan Ibarra MRN: [id number] DOB: [birth year] Today's Date: 01/25/2023   History of present illness Pt is a 57 y/o male presenting 10/28 to [facility] with severe right knee pain and drainage after a fall the night PTA, he is now s/p R knee I&D on 12/29/22 and 01/12/23. He recently had a R tibial ORIF on 12/13/22 after a falls incidence. UXL:[id number] abuse, [name], HTN, liver cirrhosis, polysubstance abuse.   OT comments  Pt progressing towards goals this session, needs incr cues for safety with transfers and to ensure WB precautions during session. Pt CGA for pivot transfers to/from chair. Pt educated on compensatory strategies for LB ADLs using figure 4 for RLE and able to demo during session. Pt presenting with impairments listed below, will follow acutely. Continue to recommend HHOT at d/c.       If plan is discharge home, recommend the following:  Assistance with cooking/housework;Help with stairs or ramp for entrance;Assist for transportation;[name];A little help with walking and/or transfers   Equipment Recommendations  Tub/shower bench    Recommendations for Other Services      Precautions / Restrictions Precautions Precautions: Fall Restrictions Weight Bearing Restrictions: Yes RLE Weight Bearing: Touchdown weight bearing       Mobility Bed Mobility Overal bed mobility: Modified Independent                  Transfers Overall transfer level: Needs assistance Equipment used: None Transfers: Bed to chair/wheelchair/BSC   Stand pivot transfers: Contact guard assist         General transfer comment: pivots to chair holding arms of the chair and using RW. Educated pt on using RW for safety     Balance Overall balance assessment: Needs assistance Sitting-balance support: No upper extremity supported, Feet supported Sitting balance-Leahy Scale: Good Sitting balance -  Comments: sitting EOB   Standing balance support: Bilateral upper extremity supported, During functional activity, Reliant on assistive device for balance Standing balance-Leahy Scale: Fair                             ADL either performed or assessed with clinical judgement   ADL Overall ADL's : Needs assistance/impaired                     Lower Body Dressing: Contact guard assist;Sitting/lateral leans Lower Body Dressing Details (indicate cue type and reason): simulated via figure 4 Toilet Transfer: Civil engineer, contracting Details (indicate cue type and reason): simulated to chair         Functional mobility during ADLs: Contact guard assist      Extremity/Trunk Assessment Upper Extremity Assessment Upper Extremity Assessment: Overall WFL for tasks assessed   Lower Extremity Assessment Lower Extremity Assessment: Defer to PT evaluation        Vision   Vision Assessment?: No apparent visual deficits;Wears glasses for reading   Perception Perception Perception: Not tested   Praxis Praxis Praxis: Not tested    Cognition Arousal: Alert Behavior During Therapy: [facility] for tasks assessed/performed, Agitated Overall Cognitive Status: Within Functional Limits for tasks assessed                                 General Comments: decr safety awareness, despite cues for WB precautions        Exercises  Shoulder Instructions       General Comments drainage of R knee, RN notified    Pertinent Vitals/ Pain       Pain Assessment Pain Assessment: Faces Pain Score: 4  Faces Pain Scale: Hurts little more Pain Location: R knee Pain Descriptors / Indicators: Discomfort, Aching Pain Intervention(s): Limited activity within patient's tolerance, Monitored during session, Repositioned  Home Living                                          Prior Functioning/Environment               Frequency  Min 1X/week        Progress Toward Goals  OT Goals(current goals can now be found in the care plan section)  Progress towards OT goals: Progressing toward goals  Acute Rehab OT Goals Patient Stated Goal: none stated OT Goal Formulation: With patient Time For Goal Achievement: 02/08/23 Potential to Achieve Goals: Good ADL Goals Pt Will Perform Tub/Shower Transfer: Shower transfer;Independently;ambulating;rolling walker Pt/caregiver will Perform Home Exercise Program: Both right and left upper extremity;Increased ROM;Increased strength Additional ADL Goal #1: pt will perform x3 functional tasks with supervision adhering to WB precautions  Plan      Co-evaluation                 AM-PAC OT "6 Clicks" Daily Activity     Outcome Measure   Help from another person eating meals?: None Help from another person taking care of personal grooming?: A Little Help from another person toileting, which includes using toliet, bedpan, or urinal?: A Little Help from another person bathing (including washing, rinsing, drying)?: A Little Help from another person to put on and taking off regular upper body clothing?: A Little Help from another person to put on and taking off regular lower body clothing?: A Lot 6 Click Score: 18    End of Session Equipment Utilized During Treatment: Rolling walker (2 wheels)  OT Visit Diagnosis: Pain;Unsteadiness on feet (R26.81);Other abnormalities of gait and mobility (R26.89);History of falling (Z91.81) Pain - Right/Left: Right Pain - part of body: Knee   Activity Tolerance Patient tolerated treatment well   Patient Left in bed;with call bell/phone within reach   Nurse Communication Mobility status; drainage from RLE bandage, seeping on to bed        Time: 8295-6213 OT Time Calculation (min): 27 min  Charges: OT Treatments $Self Care/Home Management : 23-37 mins  Carver Fila, OTD, OTR/L SecureChat Preferred Acute  Rehab [phone]   Carver Fila Koonce 01/25/2023, 2:45 PM

## 2023-01-25 NOTE — Progress Notes (Signed)
PROGRESS NOTE        PATIENT DETAILS Name: Jonathan Ibarra Age: 57 y.o. Sex: male Date of Birth: [birth year] Admit Date: 12/27/2022 Admitting Physician Osvaldo Shipper, MD [id number], [name], MD  Brief Summary: Patient is a 57 y.o.  male with history of liver cirrhosis, HTN, polysubstance abuse-recent hospitalization from 10/13-10/18 following trauma with fall resulting in a right tibia plateau fracture requiring ORIF on 10/14-presented with purulent discharge from his right knee-he was thought to have septic arthritis and subsequently admitted to the hospitalist service.  Significant events: 10/13-10/18 >> hospitalization-right tibial plateau fracture following a fall-s/p ORIF.   10/28>> purulent discharge from right knee-septic arthritis-admit to [facility] 10/30>> irrigation/debridement of right knee 11/11>> found to have purulence from right knee 11/13>> repeat irrigation/debridement  Significant studies: 08/02>> echo: EF 55-60% 10/28>> x-ray right knee: Unchanged lateral plate/screw fixation across comminuted fracture involving medial/lateral tibial plateaus, moderate knee joint effusion 10/29>> CXR: No active disease  Significant microbiology data: 10/29>> blood culture: 1/2-Corynebacterium (contamination) 10/30>> right knee abscess culture: Klebsiella pneumoniae-ESBL 10/31>> blood culture: No growth 11/13>> right knee abscess culture: No growth  Procedures: 10/30>> irrigation and debridement of right knee 11/13>> repeat irrigation/debridement.  Consults: Ortho  Infectious disease  Subjective:   Patient in bed, appears comfortable, denies any headache, no fever, no chest pain or pressure, no shortness of breath , no abdominal pain. No new focal weakness.   Objective: Vitals: Blood pressure 105/64, pulse 99, temperature 98.1 F (36.7 C), temperature source Oral, resp. rate 20, height 6' (1.829 m), weight 117.9 kg, SpO2 93%.   Exam:  Awake  Alert, No new F.N deficits, Normal affect [place].AT,PERRAL Supple Neck, No JVD,   Symmetrical Chest wall movement, Good air movement bilaterally, CTAB RRR,No Gallops, Rubs or new Murmurs,  +ve B.Sounds, Abd Soft, No tenderness,   Right leg under bandage    Assessment/Plan:  Septic arthritis right knee (ESBL Klebsiella pneumoniae)-with recent history of ORIF on 10/14 with hardware in place, being followed by orthopedics Dr. Jena Gauss and ID team. S/p I&D 10/30-VAC discontinued 11/4-unfortunately purulence was observed on 11/11 inspite of being on IV antibiotics-underwent repeat irrigation/debridement on 11/13 Cultures from 11/13 pending-but negative so far Remains on IV Invanz  Discussed with ID-11/15 Dr. Remi Deter weeks of IV Invanz from 11/13-possible suppression thereafter with PO Omadacycline thereafter.  Please reconsult infectious disease when closer to discharge.  Not a candidate for outpatient IV therapies given history of polysubstance abuse. Ortho following-WBAT as tolerated.  Note-patient asked to not change dressing by himself-he is at risk of recurrent infection-have discussed him with at length-and to let nursing staff do it with strict aseptic precautions.   PAF with RVR Rate controlled with oral metoprolol-continue midodrine for BP support Not a candidate for anticoagulation-in any events CHADS2-VASC score of 1  Liver cirrhosis-s/p TIPS Relatively well compensated Volume status relatively stable Continue lactulose. Diuretics on hold due to soft BP (on midodrine for BP support)  Polysubstance abuse Acknowledges ongoing meth/THC use and remote cocaine use-although UDS positive for cocaine Ongoing counseling  Obesity: Estimated body mass index is 35.26 kg/m as calculated from the following:   Height as of this encounter: 6' (1.829 m).   Weight as of this encounter: 117.9 kg.   Code status:   Code Status: Full Code   DVT Prophylaxis: SCDs Start: 12/28/22 0816    Family Communication: None at  bedside   Disposition Plan: Status is: Inpatient Remains inpatient appropriate because: Severity of illness   Planned Discharge Destination:Home   Diet: Diet Order             Diet regular Room service appropriate? Yes; Fluid consistency: Thin  Diet effective now                    MEDICATIONS: Scheduled Meds:  alteplase  2 mg Intracatheter Once   celecoxib  200 mg Oral BID   Chlorhexidine Gluconate Cloth  6 each Topical Daily   enoxaparin (LOVENOX) injection  60 mg Subcutaneous Q24H   folic acid  1 mg Oral Daily   lactulose  30 g Oral TID   metoprolol tartrate  50 mg Oral BID   midodrine  10 mg Oral TID WC   polyethylene glycol  17 g Oral Daily   sodium chloride flush  10-40 mL Intracatheter Q12H   sodium chloride flush  3 mL Intravenous Q12H   thiamine  100 mg Oral Daily   Vitamin D (Ergocalciferol)  50,000 Units Oral Q7 days   Continuous Infusions:  ertapenem 1 g (01/24/23 1839)   PRN Meds:.acetaminophen, bisacodyl, diltiazem, HYDROmorphone (DILAUDID) injection, lactulose, methocarbamol, mouth rinse, [DISCONTINUED] oxyCODONE **OR** oxyCODONE, polyethylene glycol, sodium chloride flush, sodium chloride flush   I have personally reviewed following labs and imaging studies  LABORATORY DATA:  Recent Labs  Lab 01/19/23 0409 01/25/23 0510  WBC 4.9 3.4*  HGB 10.6* 10.3*  HCT 33.4* 33.5*  PLT 199 127*  MCV 91.8 95.4  MCH 29.1 29.3  MCHC 31.7 30.7  RDW 14.7 14.7    Recent Labs  Lab 01/19/23 0409 01/22/23 0500 01/25/23 0510 01/25/23 0515  NA 136 137 136  --   K 4.1 3.8 4.1  --   CL 102 106 107  --   CO2 28 26 24   --   ANIONGAP 6 5 5   --   GLUCOSE 121* 88 128*  --   BUN 13 16 22*  --   CREATININE 0.60* 0.60* 0.70  --   AST 23 31 45*  --   ALT 17 22 32  --   ALKPHOS 130* 105 124  --   BILITOT 0.8 0.8 0.8  --   ALBUMIN 2.2* 2.2* 2.3*  --   CRP 0.9 0.6 0.7  --   AMMONIA 54* 57*  --  59*  CALCIUM 8.3* 8.4* 8.5*   --     No results found for: "CHOL", "HDL", "LDLCALC", "LDLDIRECT", "TRIG", "CHOLHDL"    Recent Labs  Lab 01/19/23 0409 01/22/23 0500 01/25/23 0510 01/25/23 0515  CRP 0.9 0.6 0.7  --   AMMONIA 54* 57*  --  59*  CALCIUM 8.3* 8.4* 8.5*  --      MICROBIOLOGY: No results found for this or any previous visit (from the past 240 hour(s)).  RADIOLOGY STUDIES/RESULTS: No results found.   LOS: 28 days   Signature  -    Susa Raring M.D on 01/25/2023 at 8:14 AM   -  To page go to [email or web address]

## 2023-01-26 DIAGNOSIS — M00861 Arthritis due to other bacteria, right knee: Secondary | ICD-10-CM | POA: Diagnosis not present

## 2023-01-26 NOTE — Progress Notes (Signed)
Mobility Specialist Progress Note:   01/26/23 1530  Mobility  Activity Ambulated with assistance in hallway  Level of Assistance Contact guard assist, steadying assist  Assistive Device Front wheel walker  Distance Ambulated (ft) 100 ft  RLE Weight Bearing TWB  Activity Response Tolerated fair  Mobility Referral Yes  $Mobility charge 1 Mobility  Mobility Specialist Start Time (ACUTE ONLY) 1530  Mobility Specialist Stop Time (ACUTE ONLY) 1550  Mobility Specialist Time Calculation (min) (ACUTE ONLY) 20 min   Pt agreeable to mobility session with encouragement. Required only minG assist during ambulation however frequent cues to follow WB precautions. Pt back in bed with all needs met.   Addison Lank Mobility Specialist Please contact via SecureChat or  Rehab office at [phone]

## 2023-01-26 NOTE — Progress Notes (Signed)
PROGRESS NOTE        PATIENT DETAILS Name: Jonathan Ibarra Age: 57 y.o. Sex: male Date of Birth: [birth year] Admit Date: 12/27/2022 Admitting Physician Osvaldo Shipper, MD [id number], [name], MD  Brief Summary: Patient is a 57 y.o.  male with history of liver cirrhosis, HTN, polysubstance abuse-recent hospitalization from 10/13-10/18 following trauma with fall resulting in a right tibia plateau fracture requiring ORIF on 10/14-presented with purulent discharge from his right knee-he was thought to have septic arthritis and subsequently admitted to the hospitalist service.  Significant events: 10/13-10/18 >> hospitalization-right tibial plateau fracture following a fall-s/p ORIF.   10/28>> purulent discharge from right knee-septic arthritis-admit to [facility] 10/30>> irrigation/debridement of right knee 11/11>> found to have purulence from right knee 11/13>> repeat irrigation/debridement  Significant studies: 08/02>> echo: EF 55-60% 10/28>> x-ray right knee: Unchanged lateral plate/screw fixation across comminuted fracture involving medial/lateral tibial plateaus, moderate knee joint effusion 10/29>> CXR: No active disease  Significant microbiology data: 10/29>> blood culture: 1/2-Corynebacterium (contamination) 10/30>> right knee abscess culture: Klebsiella pneumoniae-ESBL 10/31>> blood culture: No growth 11/13>> right knee abscess culture: No growth  Procedures: 10/30>> irrigation and debridement of right knee 11/13>> repeat irrigation/debridement.  Consults: Ortho  Infectious disease  Subjective:    -Difficult events overnight, he denies any complaints today   Objective: Vitals: Blood pressure 118/81, pulse 75, temperature (!) 97.3 F (36.3 C), temperature source Oral, resp. rate 18, height 6' (1.829 m), weight 117.9 kg, SpO2 95%.   Exam:  Awake Alert, Oriented X 3, No new F.N deficits, Normal affect Symmetrical Chest wall movement, Good air  movement bilaterally, CTAB RRR,No Gallops,Rubs or new Murmurs, No Parasternal Heave +ve B.Sounds, Abd Soft, No tenderness, No rebound - guarding or rigidity. Right leg under bandage    Assessment/Plan:  Septic arthritis right knee (ESBL Klebsiella pneumoniae)-with recent history of ORIF on 10/14 with hardware in place, being followed by orthopedics Dr. Jena Gauss and ID team. S/p I&D 10/30-VAC discontinued 11/4-unfortunately purulence was observed on 11/11 inspite of being on IV antibiotics-underwent repeat irrigation/debridement on 11/13 Antibiotics management per ID, continue with ertapenem, will need 4 to 6 weeks from most recent I&D on 11/13, ID will reassess again on 12/10 to determine length of treatment (if needs another 2 weeks of IV), and plan for suppressive omadacycline once done with IV antibiotics. -Orthopedic follow-up greatly appreciated - Not a candidate for outpatient IV therapies given history of polysubstance abuse.  PAF with RVR Rate controlled with oral metoprolol-continue midodrine for BP support Not a candidate for anticoagulation-in any events CHADS2-VASC score of 1  Liver cirrhosis-s/p TIPS Relatively well compensated Volume status relatively stable Continue lactulose. Diuretics on hold due to soft BP (on midodrine for BP support)  Polysubstance abuse Acknowledges ongoing meth/THC use and remote cocaine use-although UDS positive for cocaine Ongoing counseling  Obesity: Estimated body mass index is 35.26 kg/m as calculated from the following:   Height as of this encounter: 6' (1.829 m).   Weight as of this encounter: 117.9 kg.   Code status:   Code Status: Full Code   DVT Prophylaxis: SCDs Start: 12/28/22 0816   Family Communication: None at bedside   Disposition Plan: Status is: Inpatient Remains inpatient appropriate because: Severity of illness   Planned Discharge Destination:Home   Diet: Diet Order             Diet regular  Room service  appropriate? Yes; Fluid consistency: Thin  Diet effective now                    MEDICATIONS: Scheduled Meds:  alteplase  2 mg Intracatheter Once   celecoxib  200 mg Oral BID   Chlorhexidine Gluconate Cloth  6 each Topical Daily   enoxaparin (LOVENOX) injection  60 mg Subcutaneous Q24H   folic acid  1 mg Oral Daily   lactulose  30 g Oral TID   metoprolol tartrate  50 mg Oral BID   midodrine  10 mg Oral TID WC   multivitamin with minerals  1 tablet Oral Daily   polyethylene glycol  17 g Oral Daily   sodium chloride flush  10-40 mL Intracatheter Q12H   sodium chloride flush  3 mL Intravenous Q12H   thiamine  100 mg Oral Daily   Vitamin D (Ergocalciferol)  50,000 Units Oral Q7 days   Continuous Infusions:  ertapenem 1 g (01/26/23 1041)   PRN Meds:.acetaminophen, bisacodyl, diltiazem, HYDROmorphone (DILAUDID) injection, lactulose, methocarbamol, mouth rinse, [DISCONTINUED] oxyCODONE **OR** oxyCODONE, polyethylene glycol, sodium chloride flush, sodium chloride flush   I have personally reviewed following labs and imaging studies  LABORATORY DATA:  Recent Labs  Lab 01/25/23 0510  WBC 3.4*  HGB 10.3*  HCT 33.5*  PLT 127*  MCV 95.4  MCH 29.3  MCHC 30.7  RDW 14.7    Recent Labs  Lab 01/22/23 0500 01/25/23 0510 01/25/23 0515  NA 137 136  --   K 3.8 4.1  --   CL 106 107  --   CO2 26 24  --   ANIONGAP 5 5  --   GLUCOSE 88 128*  --   BUN 16 22*  --   CREATININE 0.60* 0.70  --   AST 31 45*  --   ALT 22 32  --   ALKPHOS 105 124  --   BILITOT 0.8 0.8  --   ALBUMIN 2.2* 2.3*  --   CRP 0.6 0.7  --   AMMONIA 57*  --  59*  CALCIUM 8.4* 8.5*  --     No results found for: "CHOL", "HDL", "LDLCALC", "LDLDIRECT", "TRIG", "CHOLHDL"    Recent Labs  Lab 01/22/23 0500 01/25/23 0510 01/25/23 0515  CRP 0.6 0.7  --   AMMONIA 57*  --  59*  CALCIUM 8.4* 8.5*  --      MICROBIOLOGY: No results found for this or any previous visit (from the past 240  hour(s)).  RADIOLOGY STUDIES/RESULTS: No results found.   LOS: 29 days   Signature  -    Huey Bienenstock M.D on 01/26/2023 at 1:00 PM   -  To page go to [email or web address]

## 2023-01-26 NOTE — Progress Notes (Signed)
Orthopaedic Trauma Progress Note  SUBJECTIVE: Doing ok this morning. Swelling in the knee improving. Pain remains the same. Patient asking to speak with CM/CSW regarding housing resources for discharge.   OBJECTIVE:  Vitals:   01/26/23 0005 01/26/23 0505  BP: 99/69 (!) 98/59  Pulse: 72 76  Resp: 16 16  Temp: 98.2 F (36.8 C) 97.9 F (36.6 C)  SpO2: 96% 93%    General: Sitting up in bed, no acute distress Respiratory: No increased work of breathing.  Right lower extremity:  Dressing changed, incision as below. Appears stable. Proximal incision healed. Distal incision with intermittent serosanguinous drainage. Swelling improving. I did remove 3 additional sutures. Tolerates gentle knee ROM. Ankle dorsiflexion/plantarflexion is intact. Soreness through the calf, improving.  +EHL/FHL intact.  Neurovascularly intact.       IMAGING: Repeat imaging right knee performed 01/12/2023 shows stable appearance to fixation.  No erosive changes noted.  LABS:  No results found for this or any previous visit (from the past 24 hour(s)).     ASSESSMENT: Jonathan Ibarra is a 57 y.o. male s/p IRRIGATION AND DEBRIDEMENT RIGHT KNEE on 01/12/2023 and 12/29/22  Previous ORIF of right tibial plateau fracture with repair of right lateral meniscus tear on 12/13/2022  PLAN: Weightbearing: TDWB RLE ROM: Okay for range of motion of the knee as tolerated Incisional and dressing care: Dressing changed today. Continue daily dressing changes by nursing Showering: Ok to shower  Orthopedic device(s): None Pain management: continue current regimen VTE prophylaxis: Lovenox.  SCDs ID: Per infectious disease Foley/Lines:  No foley, KVO IVFs Impediments to Fracture Healing:  Infection.  Vitamin D level 28, continue supplementation Dispo: Continue therapies as tolerated.  Patient okay to shower from ortho standpoint.  Wound looks good today, that is encouraging. Will continue to monitor wound but hopeful no  additional I&D's will be required.  Follow - up plan: 2 weeks after d/c    Contact information:  Truitt Merle MD, Thyra Breed PA-C. After hours and holidays please check [email or web address] for group call information for [facility]   Thompson Caul, PA-C [phone] (office) [email or web address]

## 2023-01-26 NOTE — Progress Notes (Signed)
Physical Therapy Treatment Patient Details Name: Jonathan Ibarra MRN: [id number] DOB: [birth year] Today's Date: 01/26/2023   History of Present Illness Pt is a 57 y/o male presenting 10/28 to [facility] with severe right knee pain and drainage after a fall the night PTA, he is now s/p R knee I&D on 12/29/22 and 01/12/23. He recently had a R tibial ORIF on 12/13/22 after a falls incidence. WUJ:WJXBJYN abuse, [name], HTN, liver cirrhosis, polysubstance abuse.    PT Comments  Pt received in supine and agreeable to session with encouragement. Pt requests to use the bathroom at the beginning of the session and is able to void while standing with CGA for safety. Pt continues to demonstrate impulsivity with mobility despite cues for safety. Pt stands from EOB and attempts to reach for RW at the end of the bed requiring assist. Pt demonstrates a quick pace with ambulation and appears to increase RLE WB with increased fatigue despite cues. Education provided on importance of mobility throughout the day with staff for increased strength and endurance. Pt continues to benefit from PT services to progress toward functional mobility goals.     If plan is discharge home, recommend the following: [name];Assist for transportation;Help with stairs or ramp for entrance   Can travel by private vehicle        Equipment Recommendations  None recommended by PT    Recommendations for Other Services       Precautions / Restrictions Precautions Precautions: Fall Restrictions Weight Bearing Restrictions: Yes RLE Weight Bearing: Touchdown weight bearing     Mobility  Bed Mobility Overal bed mobility: Modified Independent                  Transfers Overall transfer level: Needs assistance Equipment used: None Transfers: Sit to/from Stand Sit to Stand: Supervision           General transfer comment: Pt stands prior to RW being brought over and holds bedrail for  support. cues for safety and TDWB precautions    Ambulation/Gait Ambulation/Gait assistance: Contact guard assist Gait Distance (Feet): 150 Feet Assistive device: Rolling walker (2 wheels) Gait Pattern/deviations: Decreased stride length, Step-to pattern, Trunk flexed       General Gait Details: Pt appearing to increase RLE WB with increased fatigue. Cues for safety and pacing throughout due to pt moving quickly. Pt reports increased BUE fatigue      Balance Overall balance assessment: Needs assistance Sitting-balance support: No upper extremity supported, Feet supported Sitting balance-Leahy Scale: Good Sitting balance - Comments: sitting EOB   Standing balance support: Bilateral upper extremity supported, During functional activity, Reliant on assistive device for balance Standing balance-Leahy Scale: Fair Standing balance comment: reliant on RW support                            Cognition Arousal: Alert Behavior During Therapy: WFL for tasks assessed/performed, Agitated Overall Cognitive Status: Within Functional Limits for tasks assessed                                 General Comments: decr safety awareness, despite cues for WB precautions        Exercises      General Comments        Pertinent Vitals/Pain Pain Assessment Pain Assessment: Faces Faces Pain Scale: Hurts little more Pain Location: R knee Pain Descriptors /  Indicators: Discomfort, Aching Pain Intervention(s): Limited activity within patient's tolerance, Monitored during session, Repositioned     PT Goals (current goals can now be found in the care plan section) Acute Rehab PT Goals Patient Stated Goal: decrease pain and go home PT Goal Formulation: With patient Time For Goal Achievement: 01/12/23 Progress towards PT goals: Progressing toward goals    Frequency    Min 1X/week       AM-PAC PT "6 Clicks" Mobility   Outcome Measure  Help needed turning from  your back to your side while in a flat bed without using bedrails?: None Help needed moving from lying on your back to sitting on the side of a flat bed without using bedrails?: None Help needed moving to and from a bed to a chair (including a wheelchair)?: A Little Help needed standing up from a chair using your arms (e.g., wheelchair or bedside chair)?: A Little Help needed to walk in hospital room?: A Little Help needed climbing 3-5 steps with a railing? : Total 6 Click Score: 18    End of Session   Activity Tolerance: Patient tolerated treatment well;Patient limited by fatigue Patient left: in bed;with call bell/phone within reach Nurse Communication: Mobility status PT Visit Diagnosis: Muscle weakness (generalized) (M62.81);Other abnormalities of gait and mobility (R26.89);Pain     Time: 1206-1228 PT Time Calculation (min) (ACUTE ONLY): 22 min  Charges:    $Gait Training: 8-22 mins PT General Charges $$ ACUTE PT VISIT: 1 Visit                    [name], PTA Acute Rehabilitation Services Secure Chat Preferred  Office:(336) [phone]    [name] 01/26/2023, 1:27 PM

## 2023-01-26 NOTE — Plan of Care (Signed)
  Problem: Clinical Measurements: Goal: Ability to maintain clinical measurements within normal limits will improve Outcome: Progressing Goal: Diagnostic test results will improve Outcome: Progressing Goal: Respiratory complications will improve Outcome: Progressing   Problem: Coping: Goal: Level of anxiety will decrease Outcome: Progressing   Problem: Pain Management: Goal: General experience of comfort will improve Outcome: Progressing   Problem: Safety: Goal: Ability to remain free from injury will improve Outcome: Progressing   Problem: Skin Integrity: Goal: Risk for impaired skin integrity will decrease Outcome: Progressing

## 2023-01-26 NOTE — Plan of Care (Signed)
Patient still requiring antibiotics

## 2023-01-27 DIAGNOSIS — M00861 Arthritis due to other bacteria, right knee: Secondary | ICD-10-CM | POA: Diagnosis not present

## 2023-01-27 MED ORDER — CELECOXIB 100 MG PO CAPS
100.0000 mg | ORAL_CAPSULE | Freq: Two times a day (BID) | ORAL | Status: DC
  Administered 2023-01-27 – 2023-02-04 (×16): 100 mg via ORAL
  Filled 2023-01-27 (×17): qty 1

## 2023-01-27 MED ORDER — PANTOPRAZOLE SODIUM 40 MG PO TBEC
40.0000 mg | DELAYED_RELEASE_TABLET | Freq: Every day | ORAL | Status: DC
  Administered 2023-01-27 – 2023-02-04 (×9): 40 mg via ORAL
  Filled 2023-01-27 (×9): qty 1

## 2023-01-27 NOTE — Plan of Care (Signed)
  Problem: Education: Goal: Knowledge of disease or condition will improve Outcome: Progressing Goal: Understanding of medication regimen will improve Outcome: Progressing Goal: Individualized Educational Video(s) Outcome: Progressing   Problem: Education: Goal: Knowledge of General Education information will improve Description: Including pain rating scale, medication(s)/side effects and non-pharmacologic comfort measures Outcome: Progressing   Problem: Clinical Measurements: Goal: Diagnostic test results will improve Outcome: Progressing   Problem: Coping: Goal: Level of anxiety will decrease Outcome: Progressing

## 2023-01-27 NOTE — Plan of Care (Signed)
Knee healing but he is still requiring antibiotics via IV

## 2023-01-27 NOTE — Plan of Care (Signed)
  Problem: Education: Goal: Knowledge of disease or condition will improve Outcome: Progressing Goal: Understanding of medication regimen will improve Outcome: Progressing Goal: Individualized Educational Video(s) Outcome: Progressing   Problem: Education: Goal: Knowledge of General Education information will improve Description: Including pain rating scale, medication(s)/side effects and non-pharmacologic comfort measures Outcome: Progressing   Problem: Clinical Measurements: Goal: Diagnostic test results will improve Outcome: Progressing   Problem: Pain Management: Goal: General experience of comfort will improve Outcome: Progressing   Problem: Safety: Goal: Ability to remain free from injury will improve Outcome: Progressing

## 2023-01-27 NOTE — Progress Notes (Signed)
PROGRESS NOTE        PATIENT DETAILS Name: Jonathan Ibarra Age: 57 y.o. Sex: male Date of Birth: 10-Feb-1966 Admit Date: 12/27/2022 Admitting Physician Osvaldo Shipper, MD UEA:VWUJW, [name], MD  Brief Summary:  Patient is a 57 y.o.  male with history of liver cirrhosis, HTN, polysubstance abuse-recent hospitalization from 10/13-10/18 following trauma with fall resulting in a right tibia plateau fracture requiring ORIF on 10/14-presented with purulent discharge from his right knee-he was thought to have septic arthritis and subsequently admitted to the hospitalist service.  Significant events: 10/13-10/18 >> hospitalization-right tibial plateau fracture following a fall-s/p ORIF.   10/28>> purulent discharge from right knee-septic arthritis-admit to [facility] 10/30>> irrigation/debridement of right knee 11/11>> found to have purulence from right knee 11/13>> repeat irrigation/debridement  Significant studies: 08/02>> echo: EF 55-60% 10/28>> x-ray right knee: Unchanged lateral plate/screw fixation across comminuted fracture involving medial/lateral tibial plateaus, moderate knee joint effusion 10/29>> CXR: No active disease  Significant microbiology data: 10/29>> blood culture: 1/2-Corynebacterium (contamination) 10/30>> right knee abscess culture: Klebsiella pneumoniae-ESBL 10/31>> blood culture: No growth 11/13>> right knee abscess culture: No growth  Procedures: 10/30>> irrigation and debridement of right knee 11/13>> repeat irrigation/debridement.  Consults: Ortho  Infectious disease  Subjective:    -No significant events overnight, he denies any complaints today, reports BM x 3 yesterday  Objective: Vitals: Blood pressure 125/76, pulse 61, temperature 97.8 F (36.6 C), temperature source Oral, resp. rate 18, height 6' (1.829 m), weight 117.9 kg, SpO2 97%.   Exam:  Awake, alert, in no apparent distress Right leg under  bandage    Assessment/Plan:  Septic arthritis right knee (ESBL Klebsiella pneumoniae)-with recent history of ORIF on 10/14 with hardware in place, being followed by orthopedics Dr. Jena Gauss and ID team. S/p I&D 10/30-VAC discontinued 11/4-unfortunately purulence was observed on 11/11 inspite of being on IV antibiotics-underwent repeat irrigation/debridement on 11/13 Antibiotics management per ID, continue with ertapenem, will need 4 to 6 weeks from most recent I&D on 11/13, ID will reassess again on 12/10 to determine length of treatment (if needs another 2 weeks of IV), and plan for suppressive omadacycline once done with IV antibiotics. -Orthopedic follow-up greatly appreciated - Not a candidate for outpatient IV therapies given history of polysubstance abuse.  PAF with RVR Rate controlled with oral metoprolol-continue midodrine for BP support Not a candidate for anticoagulation-in any events CHADS2-VASC score of 1  Liver cirrhosis-s/p TIPS Relatively well compensated Volume status relatively stable Continue lactulose. Diuretics on hold due to soft BP (on midodrine for BP support)  Polysubstance abuse Acknowledges ongoing meth/THC use and remote cocaine use-although UDS positive for cocaine Ongoing counseling  Obesity: Estimated body mass index is 35.26 kg/m as calculated from the following:   Height as of this encounter: 6' (1.829 m).   Weight as of this encounter: 117.9 kg.   Code status:   Code Status: Full Code   DVT Prophylaxis: SCDs Start: 12/28/22 0816   Family Communication: None at bedside   Disposition Plan: Status is: Inpatient Remains inpatient appropriate because: Severity of illness   Planned Discharge Destination:Home   Diet: Diet Order             Diet regular Room service appropriate? Yes; Fluid consistency: Thin  Diet effective now  MEDICATIONS: Scheduled Meds:  alteplase  2 mg Intracatheter Once   celecoxib  200 mg  Oral BID   Chlorhexidine Gluconate Cloth  6 each Topical Daily   enoxaparin (LOVENOX) injection  60 mg Subcutaneous Q24H   folic acid  1 mg Oral Daily   lactulose  30 g Oral TID   metoprolol tartrate  50 mg Oral BID   midodrine  10 mg Oral TID WC   multivitamin with minerals  1 tablet Oral Daily   polyethylene glycol  17 g Oral Daily   sodium chloride flush  10-40 mL Intracatheter Q12H   sodium chloride flush  3 mL Intravenous Q12H   thiamine  100 mg Oral Daily   Vitamin D (Ergocalciferol)  50,000 Units Oral Q7 days   Continuous Infusions:  ertapenem 1 g (01/26/23 1041)   PRN Meds:.acetaminophen, bisacodyl, diltiazem, HYDROmorphone (DILAUDID) injection, lactulose, methocarbamol, mouth rinse, [DISCONTINUED] oxyCODONE **OR** oxyCODONE, polyethylene glycol, sodium chloride flush, sodium chloride flush   I have personally reviewed following labs and imaging studies  LABORATORY DATA:  Recent Labs  Lab 01/25/23 0510  WBC 3.4*  HGB 10.3*  HCT 33.5*  PLT 127*  MCV 95.4  MCH 29.3  MCHC 30.7  RDW 14.7    Recent Labs  Lab 01/22/23 0500 01/25/23 0510 01/25/23 0515  NA 137 136  --   K 3.8 4.1  --   CL 106 107  --   CO2 26 24  --   ANIONGAP 5 5  --   GLUCOSE 88 128*  --   BUN 16 22*  --   CREATININE 0.60* 0.70  --   AST 31 45*  --   ALT 22 32  --   ALKPHOS 105 124  --   BILITOT 0.8 0.8  --   ALBUMIN 2.2* 2.3*  --   CRP 0.6 0.7  --   AMMONIA 57*  --  59*  CALCIUM 8.4* 8.5*  --     No results found for: "CHOL", "HDL", "LDLCALC", "LDLDIRECT", "TRIG", "CHOLHDL"    Recent Labs  Lab 01/22/23 0500 01/25/23 0510 01/25/23 0515  CRP 0.6 0.7  --   AMMONIA 57*  --  59*  CALCIUM 8.4* 8.5*  --      MICROBIOLOGY: No results found for this or any previous visit (from the past 240 hour(s)).  RADIOLOGY STUDIES/RESULTS: No results found.   LOS: 30 days   Signature  -    Huey Bienenstock M.D on 01/27/2023 at 9:59 AM   -  To page go to [email or web address]

## 2023-01-28 DIAGNOSIS — M00861 Arthritis due to other bacteria, right knee: Secondary | ICD-10-CM | POA: Diagnosis not present

## 2023-01-28 LAB — CBC
HCT: 33 % — ABNORMAL LOW (ref 39.0–52.0)
Hemoglobin: 10.3 g/dL — ABNORMAL LOW (ref 13.0–17.0)
MCH: 29.4 pg (ref 26.0–34.0)
MCHC: 31.2 g/dL (ref 30.0–36.0)
MCV: 94.3 fL (ref 80.0–100.0)
Platelets: 127 10*3/uL — ABNORMAL LOW (ref 150–400)
RBC: 3.5 MIL/uL — ABNORMAL LOW (ref 4.22–5.81)
RDW: 14.8 % (ref 11.5–15.5)
WBC: 3.1 10*3/uL — ABNORMAL LOW (ref 4.0–10.5)
nRBC: 0 % (ref 0.0–0.2)

## 2023-01-28 LAB — COMPREHENSIVE METABOLIC PANEL
ALT: 39 U/L (ref 0–44)
AST: 46 U/L — ABNORMAL HIGH (ref 15–41)
Albumin: 2.3 g/dL — ABNORMAL LOW (ref 3.5–5.0)
Alkaline Phosphatase: 127 U/L — ABNORMAL HIGH (ref 38–126)
Anion gap: 4 — ABNORMAL LOW (ref 5–15)
BUN: 19 mg/dL (ref 6–20)
CO2: 24 mmol/L (ref 22–32)
Calcium: 8.7 mg/dL — ABNORMAL LOW (ref 8.9–10.3)
Chloride: 111 mmol/L (ref 98–111)
Creatinine, Ser: 0.61 mg/dL (ref 0.61–1.24)
GFR, Estimated: >60 mL/min (ref >60)
Glucose, Bld: 95 mg/dL (ref 70–99)
Potassium: 4.3 mmol/L (ref 3.5–5.1)
Sodium: 139 mmol/L (ref 135–145)
Total Bilirubin: 0.7 mg/dL (ref <1.2)
Total Protein: 5.7 g/dL — ABNORMAL LOW (ref 6.5–8.1)

## 2023-01-28 LAB — AMMONIA: Ammonia: 54 umol/L — ABNORMAL HIGH (ref 9–35)

## 2023-01-28 LAB — C-REACTIVE PROTEIN: CRP: 0.7 mg/dL (ref <1.0)

## 2023-01-28 MED ORDER — HYDROMORPHONE HCL 1 MG/ML IJ SOLN
0.5000 mg | INTRAMUSCULAR | Status: DC | PRN
  Administered 2023-01-28 – 2023-02-04 (×26): 0.5 mg via INTRAVENOUS
  Filled 2023-01-28 (×26): qty 0.5

## 2023-01-28 NOTE — Plan of Care (Signed)
  Problem: Education: Goal: Knowledge of disease or condition will improve Outcome: Progressing Goal: Understanding of medication regimen will improve Outcome: Progressing Goal: Individualized Educational Video(s) Outcome: Progressing   Problem: Health Behavior/Discharge Planning: Goal: Ability to safely manage health-related needs after discharge will improve Outcome: Progressing   Problem: Education: Goal: Knowledge of General Education information will improve Description: Including pain rating scale, medication(s)/side effects and non-pharmacologic comfort measures Outcome: Progressing   Problem: Health Behavior/Discharge Planning: Goal: Ability to manage health-related needs will improve Outcome: Progressing   Problem: Clinical Measurements: Goal: Ability to maintain clinical measurements within normal limits will improve Outcome: Progressing Goal: Diagnostic test results will improve Outcome: Progressing Goal: Respiratory complications will improve Outcome: Progressing   Problem: Coping: Goal: Level of anxiety will decrease Outcome: Progressing   Problem: Elimination: Goal: Will not experience complications related to bowel motility Outcome: Progressing   Problem: Pain Management: Goal: General experience of comfort will improve Outcome: Progressing   Problem: Safety: Goal: Ability to remain free from injury will improve Outcome: Progressing   Problem: Skin Integrity: Goal: Risk for impaired skin integrity will decrease Outcome: Progressing

## 2023-01-28 NOTE — Progress Notes (Signed)
PROGRESS NOTE        PATIENT DETAILS Name: Jonathan Ibarra Age: 57 y.o. Sex: male Date of Birth: 1966-02-27 Admit Date: 12/27/2022 Admitting Physician Osvaldo Shipper, MD TDV:VOHYW, [name], MD  Brief Summary:  Patient is a 57 y.o.  male with history of liver cirrhosis, HTN, polysubstance abuse-recent hospitalization from 10/13-10/18 following trauma with fall resulting in a right tibia plateau fracture requiring ORIF on 10/14-presented with purulent discharge from his right knee-he was thought to have septic arthritis and subsequently admitted to the hospitalist service.  Significant events: 10/13-10/18 >> hospitalization-right tibial plateau fracture following a fall-s/p ORIF.   10/28>> purulent discharge from right knee-septic arthritis-admit to [facility] 10/30>> irrigation/debridement of right knee 11/11>> found to have purulence from right knee 11/13>> repeat irrigation/debridement  Significant studies: 08/02>> echo: EF 55-60% 10/28>> x-ray right knee: Unchanged lateral plate/screw fixation across comminuted fracture involving medial/lateral tibial plateaus, moderate knee joint effusion 10/29>> CXR: No active disease  Significant microbiology data: 10/29>> blood culture: 1/2-Corynebacterium (contamination) 10/30>> right knee abscess culture: Klebsiella pneumoniae-ESBL 10/31>> blood culture: No growth 11/13>> right knee abscess culture: No growth  Procedures: 10/30>> irrigation and debridement of right knee 11/13>> repeat irrigation/debridement.  Consults: Ortho  Infectious disease  Subjective:    No significant events overnight, denies any complaints today, BM x 3 yesterday.  Objective: Vitals: Blood pressure 106/76, pulse (!) 58, temperature (!) 97.5 F (36.4 C), temperature source Oral, resp. rate 18, height 6' (1.829 m), weight 117.9 kg, SpO2 97%.   Exam:  Awake, alert, no apparent distress Good air entry bilaterally Right leg under  bandage    Assessment/Plan:  Septic arthritis right knee (ESBL Klebsiella pneumoniae)-with recent history of ORIF on 10/14 with hardware in place, being followed by orthopedics Dr. Jena Gauss and ID team. S/p I&D 10/30-VAC discontinued 11/4-unfortunately purulence was observed on 11/11 inspite of being on IV antibiotics-underwent repeat irrigation/debridement on 11/13 Antibiotics management per ID, continue with ertapenem, will need 4 to 6 weeks from most recent I&D on 11/13, ID will reassess again on 12/10 to determine length of treatment (if needs another 2 weeks of IV), and plan for suppressive omadacycline once done with IV antibiotics. -Orthopedic follow-up greatly appreciated - Not a candidate for outpatient IV therapies given history of polysubstance abuse.  PAF with RVR Rate controlled with oral metoprolol-continue midodrine for BP support Not a candidate for anticoagulation-in any events CHADS2-VASC score of 1  Liver cirrhosis-s/p TIPS Relatively well compensated Volume status relatively stable Continue lactulose.,  Ammonia level this morning is 54. Diuretics on hold due to soft BP (on midodrine for BP support)  Polysubstance abuse Acknowledges ongoing meth/THC use and remote cocaine use-although UDS positive for cocaine Ongoing counseling  Obesity: Estimated body mass index is 35.26 kg/m as calculated from the following:   Height as of this encounter: 6' (1.829 m).   Weight as of this encounter: 117.9 kg.   Code status:   Code Status: Full Code   DVT Prophylaxis: SCDs Start: 12/28/22 0816   Family Communication: None at bedside   Disposition Plan: Status is: Inpatient Remains inpatient appropriate because: Severity of illness   Planned Discharge Destination:Home   Diet: Diet Order             Diet regular Room service appropriate? Yes; Fluid consistency: Thin  Diet effective now  MEDICATIONS: Scheduled Meds:  alteplase  2 mg  Intracatheter Once   celecoxib  100 mg Oral BID   Chlorhexidine Gluconate Cloth  6 each Topical Daily   enoxaparin (LOVENOX) injection  60 mg Subcutaneous Q24H   folic acid  1 mg Oral Daily   lactulose  30 g Oral TID   metoprolol tartrate  50 mg Oral BID   midodrine  10 mg Oral TID WC   multivitamin with minerals  1 tablet Oral Daily   pantoprazole  40 mg Oral Daily   polyethylene glycol  17 g Oral Daily   sodium chloride flush  10-40 mL Intracatheter Q12H   sodium chloride flush  3 mL Intravenous Q12H   thiamine  100 mg Oral Daily   Vitamin D (Ergocalciferol)  50,000 Units Oral Q7 days   Continuous Infusions:  ertapenem 1 g (01/28/23 1052)   PRN Meds:.acetaminophen, bisacodyl, diltiazem, HYDROmorphone (DILAUDID) injection, lactulose, methocarbamol, mouth rinse, [DISCONTINUED] oxyCODONE **OR** oxyCODONE, polyethylene glycol, sodium chloride flush, sodium chloride flush   I have personally reviewed following labs and imaging studies  LABORATORY DATA:  Recent Labs  Lab 01/25/23 0510 01/28/23 0401  WBC 3.4* 3.1*  HGB 10.3* 10.3*  HCT 33.5* 33.0*  PLT 127* 127*  MCV 95.4 94.3  MCH 29.3 29.4  MCHC 30.7 31.2  RDW 14.7 14.8    Recent Labs  Lab 01/22/23 0500 01/25/23 0510 01/25/23 0515 01/28/23 0401  NA 137 136  --  139  K 3.8 4.1  --  4.3  CL 106 107  --  111  CO2 26 24  --  24  ANIONGAP 5 5  --  4*  GLUCOSE 88 128*  --  95  BUN 16 22*  --  19  CREATININE 0.60* 0.70  --  0.61  AST 31 45*  --  46*  ALT 22 32  --  39  ALKPHOS 105 124  --  127*  BILITOT 0.8 0.8  --  0.7  ALBUMIN 2.2* 2.3*  --  2.3*  CRP 0.6 0.7  --  0.7  AMMONIA 57*  --  59* 54*  CALCIUM 8.4* 8.5*  --  8.7*    No results found for: "CHOL", "HDL", "LDLCALC", "LDLDIRECT", "TRIG", "CHOLHDL"    Recent Labs  Lab 01/22/23 0500 01/25/23 0510 01/25/23 0515 01/28/23 0401  CRP 0.6 0.7  --  0.7  AMMONIA 57*  --  59* 54*  CALCIUM 8.4* 8.5*  --  8.7*     MICROBIOLOGY: No results found for this  or any previous visit (from the past 240 hour(s)).  RADIOLOGY STUDIES/RESULTS: No results found.   LOS: 31 days   Signature  -    Huey Bienenstock M.D on 01/28/2023 at 12:04 PM   -  To page go to [email or web address]

## 2023-01-28 NOTE — Progress Notes (Signed)
Mobility Specialist Progress Note;   01/28/23 1120  Mobility  Activity Ambulated with assistance in hallway  Level of Assistance Contact guard assist, steadying assist  Assistive Device Front wheel walker  Distance Ambulated (ft) 300 ft  RLE Weight Bearing TWB  Activity Response Tolerated well  Mobility Referral Yes  $Mobility charge 1 Mobility  Mobility Specialist Start Time (ACUTE ONLY) 1120  Mobility Specialist Stop Time (ACUTE ONLY) 1140  Mobility Specialist Time Calculation (min) (ACUTE ONLY) 20 min   Pt agreeable to mobility with encouragement. Required MinG assistance during ambulation. Min verbal cues required for weightbearing precautions. Took 1x standing rest break to talk to OT. Asx throughout. Pt left in chair with all needs met. NT in room.   Caesar Bookman Mobility Specialist Please contact via SecureChat or Rehab Office [phone]

## 2023-01-29 DIAGNOSIS — M00861 Arthritis due to other bacteria, right knee: Secondary | ICD-10-CM | POA: Diagnosis not present

## 2023-01-29 NOTE — Progress Notes (Signed)
PROGRESS NOTE        PATIENT DETAILS Name: Jonathan Ibarra Age: 57 y.o. Sex: male Date of Birth: Aug 22, 1965 Admit Date: 12/27/2022 Admitting Physician Osvaldo Shipper, MD [id number], [name], MD  Brief Summary:  Patient is a 57 y.o.  male with history of liver cirrhosis, HTN, polysubstance abuse-recent hospitalization from 10/13-10/18 following trauma with fall resulting in a right tibia plateau fracture requiring ORIF on 10/14-presented with purulent discharge from his right knee-he was thought to have septic arthritis and subsequently admitted to the hospitalist service.  Significant events: 10/13-10/18 >> hospitalization-right tibial plateau fracture following a fall-s/p ORIF.   10/28>> purulent discharge from right knee-septic arthritis-admit to [facility] 10/30>> irrigation/debridement of right knee 11/11>> found to have purulence from right knee 11/13>> repeat irrigation/debridement  Significant studies: 08/02>> echo: EF 55-60% 10/28>> x-ray right knee: Unchanged lateral plate/screw fixation across comminuted fracture involving medial/lateral tibial plateaus, moderate knee joint effusion 10/29>> CXR: No active disease  Significant microbiology data: 10/29>> blood culture: 1/2-Corynebacterium (contamination) 10/30>> right knee abscess culture: Klebsiella pneumoniae-ESBL 10/31>> blood culture: No growth 11/13>> right knee abscess culture: No growth  Procedures: 10/30>> irrigation and debridement of right knee 11/13>> repeat irrigation/debridement.  Consults: Ortho  Infectious disease  Subjective:    No significant events overnight, he denies any complaints today  Objective: Vitals: Blood pressure 95/67, pulse 98, temperature 98.8 F (37.1 C), temperature source Oral, resp. rate 20, height 6' (1.829 m), weight 117.9 kg, SpO2 97%.   Exam:  Awake, alert, no apparent distress Good air entry bilaterally Right knee  bandaged    Assessment/Plan:  Septic arthritis right knee (ESBL Klebsiella pneumoniae)-with recent history of ORIF on 10/14 with hardware in place, being followed by orthopedics Dr. Jena Gauss and ID team. S/p I&D 10/30-VAC discontinued 11/4-unfortunately purulence was observed on 11/11 inspite of being on IV antibiotics-underwent repeat irrigation/debridement on 11/13 Antibiotics management per ID, continue with ertapenem, will need 4 to 6 weeks from most recent I&D on 11/13, ID will reassess again on 12/10 to determine length of treatment (if needs another 2 weeks of IV), and plan for suppressive omadacycline once done with IV antibiotics. -Orthopedic follow-up greatly appreciated - Not a candidate for outpatient IV therapies given history of polysubstance abuse.  PAF with RVR Rate controlled with oral metoprolol-continue midodrine for BP support Not a candidate for anticoagulation-in any events CHADS2-VASC score of 1  Liver cirrhosis-s/p TIPS Relatively well compensated Volume status relatively stable Continue lactulose.,  Ammonia level this morning is 54. Diuretics on hold due to soft BP (on midodrine for BP support)  Polysubstance abuse Acknowledges ongoing meth/THC use and remote cocaine use-although UDS positive for cocaine Ongoing counseling  Obesity: Estimated body mass index is 35.26 kg/m as calculated from the following:   Height as of this encounter: 6' (1.829 m).   Weight as of this encounter: 117.9 kg.   Code status:   Code Status: Full Code   DVT Prophylaxis: SCDs Start: 12/28/22 0816   Family Communication: None at bedside   Disposition Plan: Status is: Inpatient Remains inpatient appropriate because: Severity of illness   Planned Discharge Destination:Home   Diet: Diet Order             Diet regular Room service appropriate? Yes; Fluid consistency: Thin  Diet effective now  MEDICATIONS: Scheduled Meds:  alteplase  2 mg  Intracatheter Once   celecoxib  100 mg Oral BID   Chlorhexidine Gluconate Cloth  6 each Topical Daily   enoxaparin (LOVENOX) injection  60 mg Subcutaneous Q24H   folic acid  1 mg Oral Daily   lactulose  30 g Oral TID   metoprolol tartrate  50 mg Oral BID   midodrine  10 mg Oral TID WC   multivitamin with minerals  1 tablet Oral Daily   pantoprazole  40 mg Oral Daily   polyethylene glycol  17 g Oral Daily   sodium chloride flush  10-40 mL Intracatheter Q12H   sodium chloride flush  3 mL Intravenous Q12H   thiamine  100 mg Oral Daily   Vitamin D (Ergocalciferol)  50,000 Units Oral Q7 days   Continuous Infusions:  ertapenem 1 g (01/29/23 0902)   PRN Meds:.acetaminophen, bisacodyl, diltiazem, HYDROmorphone (DILAUDID) injection, lactulose, methocarbamol, mouth rinse, [DISCONTINUED] oxyCODONE **OR** oxyCODONE, polyethylene glycol, sodium chloride flush, sodium chloride flush   I have personally reviewed following labs and imaging studies  LABORATORY DATA:  Recent Labs  Lab 01/25/23 0510 01/28/23 0401  WBC 3.4* 3.1*  HGB 10.3* 10.3*  HCT 33.5* 33.0*  PLT 127* 127*  MCV 95.4 94.3  MCH 29.3 29.4  MCHC 30.7 31.2  RDW 14.7 14.8    Recent Labs  Lab 01/25/23 0510 01/25/23 0515 01/28/23 0401  NA 136  --  139  K 4.1  --  4.3  CL 107  --  111  CO2 24  --  24  ANIONGAP 5  --  4*  GLUCOSE 128*  --  95  BUN 22*  --  19  CREATININE 0.70  --  0.61  AST 45*  --  46*  ALT 32  --  39  ALKPHOS 124  --  127*  BILITOT 0.8  --  0.7  ALBUMIN 2.3*  --  2.3*  CRP 0.7  --  0.7  AMMONIA  --  59* 54*  CALCIUM 8.5*  --  8.7*    No results found for: "CHOL", "HDL", "LDLCALC", "LDLDIRECT", "TRIG", "CHOLHDL"    Recent Labs  Lab 01/25/23 0510 01/25/23 0515 01/28/23 0401  CRP 0.7  --  0.7  AMMONIA  --  59* 54*  CALCIUM 8.5*  --  8.7*     MICROBIOLOGY: No results found for this or any previous visit (from the past 240 hour(s)).  RADIOLOGY STUDIES/RESULTS: No results found.    LOS: 32 days   Signature  -    Huey Bienenstock M.D on 01/29/2023 at 1:58 PM   -  To page go to [email or web address]

## 2023-01-29 NOTE — Progress Notes (Signed)
Mobility Specialist Progress Note;   01/29/23 1405  Mobility  Activity Ambulated with assistance in hallway  Level of Assistance Contact guard assist, steadying assist  Assistive Device Front wheel walker  Distance Ambulated (ft) 300 ft  RLE Weight Bearing TWB  Activity Response Tolerated well  Mobility Referral Yes  $Mobility charge 1 Mobility  Mobility Specialist Start Time (ACUTE ONLY) 1405  Mobility Specialist Stop Time (ACUTE ONLY) 1425  Mobility Specialist Time Calculation (min) (ACUTE ONLY) 20 min   Pt agreeable to mobility with encouragement. Required SV for STS and MinG during ambulation. Min verbal cues required while ambulating for weightbearing precautions. C/o knee pain once back in room. Pt returned back to bed with all needs met.  Caesar Bookman Mobility Specialist Please contact via SecureChat or Rehab Office [phone]

## 2023-01-30 DIAGNOSIS — M00861 Arthritis due to other bacteria, right knee: Secondary | ICD-10-CM | POA: Diagnosis not present

## 2023-01-30 NOTE — Progress Notes (Signed)
OT Cancellation Note  Patient Details Name: Jonathan Ibarra MRN: [id number] DOB: 1965-03-31   Cancelled Treatment:    Reason Eval/Treat Not Completed: Other (comment).  Attempted skilled OT treatment session with pt. Pt. Politely declines but states he'll "be ready to do something tomorrow".   Reviewed we will have someone from OT come tomorrow as able.  Pt. Verbalized understanding.    Alessandra Bevels Lorraine-COTA/L 01/30/2023, 11:22 AM

## 2023-01-30 NOTE — Progress Notes (Signed)
PROGRESS NOTE        PATIENT DETAILS Name: Jonathan Ibarra Age: 57 y.o. Sex: male Date of Birth: [birth year] Admit Date: 12/27/2022 Admitting Physician Osvaldo Shipper, MD [id number], [name], MD  Brief Summary:  Patient is a 57 y.o.  male with history of liver cirrhosis, HTN, polysubstance abuse-recent hospitalization from 10/13-10/18 following trauma with fall resulting in a right tibia plateau fracture requiring ORIF on 10/14-presented with purulent discharge from his right knee-he was thought to have septic arthritis and subsequently admitted to the hospitalist service.  Significant events: 10/13-10/18 >> hospitalization-right tibial plateau fracture following a fall-s/p ORIF.   10/28>> purulent discharge from right knee-septic arthritis-admit to [facility] 10/30>> irrigation/debridement of right knee 11/11>> found to have purulence from right knee 11/13>> repeat irrigation/debridement  Significant studies: 08/02>> echo: EF 55-60% 10/28>> x-ray right knee: Unchanged lateral plate/screw fixation across comminuted fracture involving medial/lateral tibial plateaus, moderate knee joint effusion 10/29>> CXR: No active disease  Significant microbiology data: 10/29>> blood culture: 1/2-Corynebacterium (contamination) 10/30>> right knee abscess culture: Klebsiella pneumoniae-ESBL 10/31>> blood culture: No growth 11/13>> right knee abscess culture: No growth  Procedures: 10/30>> irrigation and debridement of right knee 11/13>> repeat irrigation/debridement.  Consults: Ortho  Infectious disease  Subjective:    No significant events overnight, he denies any complaints today  Objective: Vitals: Blood pressure 107/71, pulse 83, temperature 98.7 F (37.1 C), temperature source Oral, resp. rate 16, height 6' (1.829 m), weight 117.9 kg, SpO2 97%.   Exam:  Awake, alert, no apparent distress Right knee bandaged    Assessment/Plan:  Septic arthritis  right knee (ESBL Klebsiella pneumoniae)-with recent history of ORIF on 10/14 with hardware in place, being followed by orthopedics Dr. Jena Gauss and ID team. S/p I&D 10/30-VAC discontinued 11/4-unfortunately purulence was observed on 11/11 inspite of being on IV antibiotics-underwent repeat irrigation/debridement on 11/13 Antibiotics management per ID, continue with ertapenem, will need 4 to 6 weeks from most recent I&D on 11/13, ID will reassess again on 12/10 to determine length of treatment (if needs another 2 weeks of IV), and plan for suppressive omadacycline once done with IV antibiotics. -Orthopedic follow-up greatly appreciated - Not a candidate for outpatient IV therapies given history of polysubstance abuse.  PAF with RVR Rate controlled with oral metoprolol-continue midodrine for BP support Not a candidate for anticoagulation-in any events CHADS2-VASC score of 1  Liver cirrhosis-s/p TIPS Relatively well compensated Volume status relatively stable Continue with current dose lactulose as overall mentation appears to be appropriate.  He is having averaging 3 BMs per day which is appropriate. Diuretics on hold due to soft BP (on midodrine for BP support)  Polysubstance abuse Acknowledges ongoing meth/THC use and remote cocaine use-although UDS positive for cocaine Ongoing counseling  Obesity: Estimated body mass index is 35.26 kg/m as calculated from the following:   Height as of this encounter: 6' (1.829 m).   Weight as of this encounter: 117.9 kg.   Code status:   Code Status: Full Code   DVT Prophylaxis: SCDs Start: 12/28/22 0816   Family Communication: None at bedside   Disposition Plan: Status is: Inpatient Remains inpatient appropriate because: Severity of illness   Planned Discharge Destination:Home   Diet: Diet Order             Diet regular Room service appropriate? Yes; Fluid consistency: Thin  Diet effective now  MEDICATIONS: Scheduled Meds:  alteplase  2 mg Intracatheter Once   celecoxib  100 mg Oral BID   Chlorhexidine Gluconate Cloth  6 each Topical Daily   enoxaparin (LOVENOX) injection  60 mg Subcutaneous Q24H   folic acid  1 mg Oral Daily   lactulose  30 g Oral TID   metoprolol tartrate  50 mg Oral BID   midodrine  10 mg Oral TID WC   multivitamin with minerals  1 tablet Oral Daily   pantoprazole  40 mg Oral Daily   polyethylene glycol  17 g Oral Daily   sodium chloride flush  10-40 mL Intracatheter Q12H   sodium chloride flush  3 mL Intravenous Q12H   thiamine  100 mg Oral Daily   Vitamin D (Ergocalciferol)  50,000 Units Oral Q7 days   Continuous Infusions:  ertapenem 1 g (01/30/23 1040)   PRN Meds:.acetaminophen, bisacodyl, diltiazem, HYDROmorphone (DILAUDID) injection, lactulose, methocarbamol, mouth rinse, [DISCONTINUED] oxyCODONE **OR** oxyCODONE, polyethylene glycol, sodium chloride flush, sodium chloride flush   I have personally reviewed following labs and imaging studies  LABORATORY DATA:  Recent Labs  Lab 01/25/23 0510 01/28/23 0401  WBC 3.4* 3.1*  HGB 10.3* 10.3*  HCT 33.5* 33.0*  PLT 127* 127*  MCV 95.4 94.3  MCH 29.3 29.4  MCHC 30.7 31.2  RDW 14.7 14.8    Recent Labs  Lab 01/25/23 0510 01/25/23 0515 01/28/23 0401  NA 136  --  139  K 4.1  --  4.3  CL 107  --  111  CO2 24  --  24  ANIONGAP 5  --  4*  GLUCOSE 128*  --  95  BUN 22*  --  19  CREATININE 0.70  --  0.61  AST 45*  --  46*  ALT 32  --  39  ALKPHOS 124  --  127*  BILITOT 0.8  --  0.7  ALBUMIN 2.3*  --  2.3*  CRP 0.7  --  0.7  AMMONIA  --  59* 54*  CALCIUM 8.5*  --  8.7*    No results found for: "CHOL", "HDL", "LDLCALC", "LDLDIRECT", "TRIG", "CHOLHDL"    Recent Labs  Lab 01/25/23 0510 01/25/23 0515 01/28/23 0401  CRP 0.7  --  0.7  AMMONIA  --  59* 54*  CALCIUM 8.5*  --  8.7*     MICROBIOLOGY: No results found for this or any previous visit (from the past 240  hour(s)).  RADIOLOGY STUDIES/RESULTS: No results found.   LOS: 33 days   Signature  -    Huey Bienenstock M.D on 01/30/2023 at 12:52 PM   -  To page go to [email or web address]

## 2023-01-30 NOTE — Plan of Care (Signed)
Patient remains on iv antibiotics for infection in his right knee

## 2023-01-31 DIAGNOSIS — M00861 Arthritis due to other bacteria, right knee: Secondary | ICD-10-CM | POA: Diagnosis not present

## 2023-01-31 NOTE — Progress Notes (Signed)
Occupational Therapy Treatment Patient Details Name: Jonathan Ibarra MRN: [id number] DOB: January 17, 1966 Today's Date: 01/31/2023   History of present illness Pt is a 57 y/o male presenting 10/28 to [facility] with severe right knee pain and drainage after a fall the night PTA, he is now s/p R knee I&D on 12/29/22 and 01/12/23. He recently had a R tibial ORIF on 12/13/22 after a falls incidence. WUJ:WJXBJYN abuse, [name], HTN, liver cirrhosis, polysubstance abuse.   OT comments  Pt progressing towards goals, needs mod cues for TWB precautions during session, pt wanting to step/place RLE on floor when fatiguing. Educated pt on stopping and taking rest breaks when feeling fatigued to ensure adherence to WB precautions, pt verbalizes understanding but will benefit from continued reinforcement. Pt completing ADLs with setup-supervision, ind with bed mobility and CGA for transfers with RW. Pt presenting with impairments listed below, will follow acutely. Continue to recommend HHOT at d/c.       If plan is discharge home, recommend the following:  Assistance with cooking/housework;Help with stairs or ramp for entrance;Assist for transportation;[name];A little help with walking and/or transfers   Equipment Recommendations  Tub/shower bench    Recommendations for Other Services      Precautions / Restrictions Precautions Precautions: Fall Restrictions Weight Bearing Restrictions: Yes RLE Weight Bearing: Touchdown weight bearing       Mobility Bed Mobility Overal bed mobility: Independent                  Transfers Overall transfer level: Needs assistance Equipment used: Rolling walker (2 wheels) Transfers: Sit to/from Stand Sit to Stand: Contact guard assist           General transfer comment: pt needs mod cues and visual demo for WB precautions as pt frequently stepping on RLE     Balance Overall balance assessment: Needs  assistance Sitting-balance support: No upper extremity supported, Feet supported Sitting balance-Leahy Scale: Good Sitting balance - Comments: sitting EOB   Standing balance support: Bilateral upper extremity supported, During functional activity, Reliant on assistive device for balance Standing balance-Leahy Scale: Fair Standing balance comment: reliant on RW support                           ADL either performed or assessed with clinical judgement   ADL Overall ADL's : Needs assistance/impaired     Grooming: Set up;Standing;Oral care Grooming Details (indicate cue type and reason): standing at sink         Upper Body Dressing : Set up;Sitting Upper Body Dressing Details (indicate cue type and reason): don gown Lower Body Dressing: Supervision/safety;Sitting/lateral leans Lower Body Dressing Details (indicate cue type and reason): figure 4 for BLE Toilet Transfer: Ambulation;Rolling walker (2 wheels);Contact guard assist           Functional mobility during ADLs: Contact guard assist;Rolling walker (2 wheels)      Extremity/Trunk Assessment Upper Extremity Assessment Upper Extremity Assessment: Overall WFL for tasks assessed   Lower Extremity Assessment Lower Extremity Assessment: Defer to PT evaluation        Vision   Vision Assessment?: No apparent visual deficits;Wears glasses for reading   Perception Perception Perception: Not tested   Praxis Praxis Praxis: Not tested    Cognition Arousal: Alert Behavior During Therapy: [facility] for tasks assessed/performed, Agitated Overall Cognitive Status: Within Functional Limits for tasks assessed  General Comments: decr safety awareness, despite cues for WB precautions        Exercises      Shoulder Instructions       General Comments VSS    Pertinent Vitals/ Pain       Pain Assessment Pain Assessment: Faces Pain Score: 4  Faces Pain Scale: Hurts  little more Pain Location: R knee with ROM Pain Descriptors / Indicators: Discomfort, Aching Pain Intervention(s): Limited activity within patient's tolerance, Monitored during session, Repositioned  Home Living                                          Prior Functioning/Environment              Frequency  Min 1X/week        Progress Toward Goals  OT Goals(current goals can now be found in the care plan section)  Progress towards OT goals: Progressing toward goals  Acute Rehab OT Goals Patient Stated Goal: none stated OT Goal Formulation: With patient Time For Goal Achievement: 02/08/23 Potential to Achieve Goals: Good ADL Goals Pt Will Perform Grooming: with modified independence;sitting Pt Will Perform Lower Body Bathing: with modified independence;sitting/lateral leans Pt Will Perform Lower Body Dressing: with modified independence;sitting/lateral leans Pt Will Transfer to Toilet: ambulating;with supervision;bedside commode Pt Will Perform Toileting - Clothing Manipulation and hygiene: sit to/from stand;with modified independence;sitting/lateral leans Pt Will Perform Tub/Shower Transfer: Shower transfer;Independently;ambulating;rolling walker Pt/caregiver will Perform Home Exercise Program: Both right and left upper extremity;Increased ROM;Increased strength Additional ADL Goal #1: pt will perform x3 functional tasks with supervision adhering to WB precautions  Plan      Co-evaluation                 AM-PAC OT "6 Clicks" Daily Activity     Outcome Measure   Help from another person eating meals?: None Help from another person taking care of personal grooming?: A Little Help from another person toileting, which includes using toliet, bedpan, or urinal?: A Little Help from another person bathing (including washing, rinsing, drying)?: A Little Help from another person to put on and taking off regular upper body clothing?: A Little Help from  another person to put on and taking off regular lower body clothing?: A Little 6 Click Score: 19    End of Session Equipment Utilized During Treatment: Rolling walker (2 wheels)  OT Visit Diagnosis: Pain;Unsteadiness on feet (R26.81);Other abnormalities of gait and mobility (R26.89);History of falling (Z91.81) Pain - Right/Left: Right Pain - part of body: Knee   Activity Tolerance Patient tolerated treatment well   Patient Left in bed;with call bell/phone within reach   Nurse Communication Mobility status        Time: 4098-1191 OT Time Calculation (min): 19 min  Charges: OT General Charges $OT Visit: 1 Visit OT Treatments $Self Care/Home Management : 8-22 mins  Carver Fila, OTD, OTR/L SecureChat Preferred Acute Rehab [phone]    Carver Fila Koonce 01/31/2023, 3:17 PM

## 2023-01-31 NOTE — Progress Notes (Signed)
PROGRESS NOTE        PATIENT DETAILS Name: Jonathan Ibarra Age: 57 y.o. Sex: male Date of Birth: [birth year] Admit Date: 12/27/2022 Admitting Physician Osvaldo Shipper, MD [id number], [name], MD  Brief Summary:  Patient is a 57 y.o.  male with history of liver cirrhosis, HTN, polysubstance abuse-recent hospitalization from 10/13-10/18 following trauma with fall resulting in a right tibia plateau fracture requiring ORIF on 10/14-presented with purulent discharge from his right knee-he was thought to have septic arthritis and subsequently admitted to the hospitalist service.  Significant events: 10/13-10/18 >> hospitalization-right tibial plateau fracture following a fall-s/p ORIF.   10/28>> purulent discharge from right knee-septic arthritis-admit to [facility] 10/30>> irrigation/debridement of right knee 11/11>> found to have purulence from right knee 11/13>> repeat irrigation/debridement  Significant studies: 08/02>> echo: EF 55-60% 10/28>> x-ray right knee: Unchanged lateral plate/screw fixation across comminuted fracture involving medial/lateral tibial plateaus, moderate knee joint effusion 10/29>> CXR: No active disease  Significant microbiology data: 10/29>> blood culture: 1/2-Corynebacterium (contamination) 10/30>> right knee abscess culture: Klebsiella pneumoniae-ESBL 10/31>> blood culture: No growth 11/13>> right knee abscess culture: No growth  Procedures: 10/30>> irrigation and debridement of right knee 11/13>> repeat irrigation/debridement.  Consults: Ortho  Infectious disease  Subjective:    Difficult events overnight, he is moving his bowels regularly, compliant with lactulose  Objective: Vitals: Blood pressure 114/77, pulse 85, temperature 97.7 F (36.5 C), temperature source Oral, resp. rate 20, height 6' (1.829 m), weight 117.9 kg, SpO2 95%.   Exam:  Awake, alert, no apparent distress, right knee  bandaged,   Assessment/Plan:  Septic arthritis right knee (ESBL Klebsiella pneumoniae)-with recent history of ORIF on 10/14 with hardware in place, being followed by orthopedics Dr. Jena Gauss and ID team. S/p I&D 10/30-VAC discontinued 11/4-unfortunately purulence was observed on 11/11 inspite of being on IV antibiotics-underwent repeat irrigation/debridement on 11/13 Antibiotics management per ID, continue with ertapenem, will need 4 to 6 weeks from most recent I&D on 11/13, ID will reassess again on 12/10 to determine length of treatment (if needs another 2 weeks of IV), and plan for suppressive omadacycline once done with IV antibiotics. -Orthopedic follow-up greatly appreciated - Not a candidate for outpatient IV therapies given history of polysubstance abuse.  PAF with RVR Rate controlled with oral metoprolol-continue midodrine for BP support Not a candidate for anticoagulation-in any events CHADS2-VASC score of 1  Liver cirrhosis-s/p TIPS Relatively well compensated Volume status relatively stable Continue with current dose lactulose as overall mentation appears to be appropriate.  He is having averaging 3 BMs per day which is appropriate. Diuretics on hold due to soft BP (on midodrine for BP support)  Polysubstance abuse Acknowledges ongoing meth/THC use and remote cocaine use-although UDS positive for cocaine Ongoing counseling  Obesity: Estimated body mass index is 35.26 kg/m as calculated from the following:   Height as of this encounter: 6' (1.829 m).   Weight as of this encounter: 117.9 kg.   Code status:   Code Status: Full Code   DVT Prophylaxis: SCDs Start: 12/28/22 0816   Family Communication: None at bedside   Disposition Plan: Status is: Inpatient Remains inpatient appropriate because: Severity of illness   Planned Discharge Destination:Home   Diet: Diet Order             Diet regular Room service appropriate? Yes; Fluid consistency: Thin  Diet  effective now  MEDICATIONS: Scheduled Meds:  alteplase  2 mg Intracatheter Once   celecoxib  100 mg Oral BID   Chlorhexidine Gluconate Cloth  6 each Topical Daily   enoxaparin (LOVENOX) injection  60 mg Subcutaneous Q24H   folic acid  1 mg Oral Daily   lactulose  30 g Oral TID   metoprolol tartrate  50 mg Oral BID   midodrine  10 mg Oral TID WC   multivitamin with minerals  1 tablet Oral Daily   pantoprazole  40 mg Oral Daily   polyethylene glycol  17 g Oral Daily   sodium chloride flush  10-40 mL Intracatheter Q12H   sodium chloride flush  3 mL Intravenous Q12H   thiamine  100 mg Oral Daily   Vitamin D (Ergocalciferol)  50,000 Units Oral Q7 days   Continuous Infusions:  ertapenem 1 g (01/31/23 0831)   PRN Meds:.acetaminophen, bisacodyl, diltiazem, HYDROmorphone (DILAUDID) injection, lactulose, methocarbamol, mouth rinse, [DISCONTINUED] oxyCODONE **OR** oxyCODONE, polyethylene glycol, sodium chloride flush, sodium chloride flush   I have personally reviewed following labs and imaging studies  LABORATORY DATA:  Recent Labs  Lab 01/25/23 0510 01/28/23 0401  WBC 3.4* 3.1*  HGB 10.3* 10.3*  HCT 33.5* 33.0*  PLT 127* 127*  MCV 95.4 94.3  MCH 29.3 29.4  MCHC 30.7 31.2  RDW 14.7 14.8    Recent Labs  Lab 01/25/23 0510 01/25/23 0515 01/28/23 0401  NA 136  --  139  K 4.1  --  4.3  CL 107  --  111  CO2 24  --  24  ANIONGAP 5  --  4*  GLUCOSE 128*  --  95  BUN 22*  --  19  CREATININE 0.70  --  0.61  AST 45*  --  46*  ALT 32  --  39  ALKPHOS 124  --  127*  BILITOT 0.8  --  0.7  ALBUMIN 2.3*  --  2.3*  CRP 0.7  --  0.7  AMMONIA  --  59* 54*  CALCIUM 8.5*  --  8.7*    No results found for: "CHOL", "HDL", "LDLCALC", "LDLDIRECT", "TRIG", "CHOLHDL"    Recent Labs  Lab 01/25/23 0510 01/25/23 0515 01/28/23 0401  CRP 0.7  --  0.7  AMMONIA  --  59* 54*  CALCIUM 8.5*  --  8.7*     MICROBIOLOGY: No results found for this or any  previous visit (from the past 240 hour(s)).  RADIOLOGY STUDIES/RESULTS: No results found.   LOS: 34 days   Signature  -    Huey Bienenstock M.D on 01/31/2023 at 11:34 AM   -  To page go to [email or web address]

## 2023-01-31 NOTE — Plan of Care (Signed)
Plan of care is reviewed. Pt is progressing. He is stable hemodynamically, afebrile, no acute distress noted overnight. Right knee pain is well controlled. Pt is able to rest well.   Problem: Education: Goal: Knowledge of disease or condition will improve Outcome: Progressing Goal: Understanding of medication regimen will improve Outcome: Progressing Goal: Individualized Educational Video(s) Outcome: Progressing   Problem: Health Behavior/Discharge Planning: Goal: Ability to safely manage health-related needs after discharge will improve Outcome: Progressing   Problem: Clinical Measurements: Goal: Ability to maintain clinical measurements within normal limits will improve Outcome: Progressing Goal: Diagnostic test results will improve Outcome: Progressing Goal: Respiratory complications will improve Outcome: Progressing   Problem: Coping: Goal: Level of anxiety will decrease Outcome: Progressing   Problem: Skin Integrity: Goal: Risk for impaired skin integrity will decrease Outcome: Progressing   Filiberto Pinks, RN

## 2023-02-01 DIAGNOSIS — M00861 Arthritis due to other bacteria, right knee: Secondary | ICD-10-CM | POA: Diagnosis not present

## 2023-02-01 LAB — CBC
HCT: 34.3 % — ABNORMAL LOW (ref 39.0–52.0)
Hemoglobin: 10.8 g/dL — ABNORMAL LOW (ref 13.0–17.0)
MCH: 29.3 pg (ref 26.0–34.0)
MCHC: 31.5 g/dL (ref 30.0–36.0)
MCV: 93 fL (ref 80.0–100.0)
Platelets: 111 10*3/uL — ABNORMAL LOW (ref 150–400)
RBC: 3.69 MIL/uL — ABNORMAL LOW (ref 4.22–5.81)
RDW: 14.8 % (ref 11.5–15.5)
WBC: 2.8 10*3/uL — ABNORMAL LOW (ref 4.0–10.5)
nRBC: 0 % (ref 0.0–0.2)

## 2023-02-01 NOTE — Progress Notes (Signed)
Nutrition Follow-up  DOCUMENTATION CODES:   Obesity unspecified  INTERVENTION:  Continue with regular diet.  Multivitamin/minerals   NUTRITION DIAGNOSIS:   Increased nutrient needs related to chronic illness as evidenced by estimated needs.  Ongoing with interventions in place.   GOAL:   Patient will meet greater than or equal to 90% of their needs    MONITOR:   PO intake, Supplement acceptance, Weight trends, Skin  REASON FOR ASSESSMENT:   LOS    ASSESSMENT:   57 y.o. M, admitted for septic arthritis of right knee; past medical history of liver cirrhosis, HTN, polysubstance abuse-recent hospitalization from 10/13-10/18 following trauma with fall resulting in a right tibia plateau fracture requiring ORF on 10/14. IV antibiotics will be here till 12/10, then be reassessed.  Patient stated no concerns good appetite. Independent feeding ability. No chewing or swallowing concerns noted.   10/13-10/18 >> hospitalization-right tibial plateau fracture following a fall-s/p ORIF.   10/28>> purulent discharge from right knee-septic arthritis-admit to [facility] 10/30>> irrigation/debridement of right knee 11/11>> found to have purulence from right knee 11/13>> repeat irrigation/debridement  Current weight: 117.9 kg     Average Meal Intake: 75-100% average intake of most meals  Nutritionally Relevant Medications: Scheduled Meds:  folic acid  1 mg Oral Daily   lactulose  30 g Oral TID   midodrine  10 mg Oral TID WC   multivitamin with minerals  1 tablet Oral Daily   pantoprazole  40 mg Oral Daily   polyethylene glycol  17 g Oral Daily   sodium chloride flush  10-40 mL Intracatheter Q12H   sodium chloride flush  3 mL Intravenous Q12H   thiamine  100 mg Oral Daily   Vitamin D (Ergocalciferol)  50,000 Units Oral Q7 days    Continuous Infusions:  ertapenem 1 g (02/01/23 0920)   PRN Meds:.acetaminophen, bisacodyl, diltiazem, HYDROmorphone (DILAUDID) injection, lactulose,  methocarbamol, mouth rinse, [DISCONTINUED] oxyCODONE **OR** oxyCODONE, polyethylene glycol, sodium chloride flush, sodium chloride flush  Labs Reviewed    NUTRITION - FOCUSED PHYSICAL EXAM:  Flowsheet Row Most Recent Value  Orbital Region No depletion  Upper Arm Region No depletion  Buccal Region No depletion  Temple Region No depletion  Clavicle Bone Region No depletion  Clavicle and Acromion Bone Region No depletion  Scapular Bone Region No depletion  Dorsal Hand No depletion  Patellar Region No depletion  Anterior Thigh Region No depletion  Posterior Calf Region No depletion  Edema (RD Assessment) Mild  Hair Reviewed  Eyes Reviewed  Mouth Reviewed  Skin Reviewed  Nails Reviewed       Diet Order:   Diet Order             Diet regular Room service appropriate? Yes; Fluid consistency: Thin  Diet effective now                   EDUCATION NEEDS:   Education needs have been addressed  Skin:  Skin Assessment: Reviewed RN Assessment  Last BM:  11/25  Height:   Ht Readings from Last 1 Encounters:  01/12/23 6' (1.829 m)    Weight:   Wt Readings from Last 1 Encounters:  01/12/23 117.9 kg    Ideal Body Weight:     BMI:  Body mass index is 35.26 kg/m.  Estimated Nutritional Needs:   Kcal:  2550- 2850 kcal/d  Protein:  105-120 g/d  Fluid:  56ml/kcal    Jamelle Haring RDN, LDN Clinical Dietitian  RDN pager # available on  Amion

## 2023-02-01 NOTE — Progress Notes (Signed)
Physical Therapy Treatment Patient Details Name: Jonathan Ibarra MRN: [id number] DOB: 30-Jul-1965 Today's Date: 02/01/2023   History of Present Illness Pt is a 57 y/o male presenting 10/28 to [facility] with severe right knee pain and drainage after a fall the night PTA, he is now s/p R knee I&D on 12/29/22 and 01/12/23. He recently had a R tibial ORIF on 12/13/22 after a falls incidence. NWG:NFAOZHY abuse, [name], HTN, liver cirrhosis, polysubstance abuse.    PT Comments  Pt received in supine and agreeable to session. Pt able to tolerate gait trial in hallway with improved pacing and RLE TDWB with cues. Pt continuing to require cues for technique, however demonstrates improved impulsivity this session. Pt able to perform hygiene tasks in bathroom with intermittent assist. Pt continues to benefit from PT services to progress toward functional mobility goals.    If plan is discharge home, recommend the following: [name];Assist for transportation;Help with stairs or ramp for entrance   Can travel by private vehicle        Equipment Recommendations  None recommended by PT    Recommendations for Other Services       Precautions / Restrictions Precautions Precautions: Fall Restrictions Weight Bearing Restrictions: Yes RLE Weight Bearing: Touchdown weight bearing     Mobility  Bed Mobility Overal bed mobility: Independent Bed Mobility: Sit to Supine, Supine to Sit                Transfers Overall transfer level: Needs assistance Equipment used: Rolling walker (2 wheels) Transfers: Sit to/from Stand Sit to Stand: Contact guard assist           General transfer comment: Cues for RLE and hand placement    Ambulation/Gait Ambulation/Gait assistance: Contact guard assist Gait Distance (Feet): 150 Feet Assistive device: Rolling walker (2 wheels) Gait Pattern/deviations: Decreased stride length, Trunk flexed, Step-through pattern        General Gait Details: Pt appearing to increase RLE WB with increased fatigue. Cues for safety and pacing throughout due to pt moving quickly. Improved pacing and RLE TDWB with cues      Balance Overall balance assessment: Needs assistance Sitting-balance support: No upper extremity supported, [name], During functional activity, Reliant on assistive device for balance Standing balance-Leahy Scale: Fair Standing balance comment: reliant on RW support                            Cognition Arousal: Alert Behavior During Therapy: WFL for tasks assessed/performed, Agitated Overall Cognitive Status: Within Functional Limits for tasks assessed                                 General Comments: decr safety awareness, despite cues for WB precautions        Exercises      General Comments        Pertinent Vitals/Pain Pain Assessment Pain Assessment: Faces Faces Pain Scale: Hurts little more Pain Location: R knee Pain Descriptors / Indicators: Discomfort, Aching Pain Intervention(s): Monitored during session, Limited activity within patient's tolerance, Repositioned     PT Goals (current goals can now be found in the care plan section) Acute Rehab PT Goals Patient Stated Goal: decrease pain and go home PT Goal Formulation: With patient Time For Goal Achievement: 01/12/23  Progress towards PT goals: Progressing toward goals    Frequency    Min 1X/week       AM-PAC PT "6 Clicks" Mobility   Outcome Measure  Help needed turning from your back to your side while in a flat bed without using bedrails?: None Help needed moving from lying on your back to sitting on the side of a flat bed without using bedrails?: None Help needed moving to and from a bed to a chair (including a wheelchair)?: A Little Help needed standing up from a chair using  your arms (e.g., wheelchair or bedside chair)?: A Little Help needed to walk in hospital room?: A Little Help needed climbing 3-5 steps with a railing? : Total 6 Click Score: 18    End of Session Equipment Utilized During Treatment: Gait belt Activity Tolerance: Patient tolerated treatment well;Patient limited by fatigue Patient left: in bed;with call bell/phone within reach Nurse Communication: Mobility status PT Visit Diagnosis: Muscle weakness (generalized) (M62.81);Other abnormalities of gait and mobility (R26.89);Pain     Time: 1610-9604 PT Time Calculation (min) (ACUTE ONLY): 32 min  Charges:    $Gait Training: 8-22 mins $Therapeutic Activity: 8-22 mins PT General Charges $$ ACUTE PT VISIT: 1 Visit                     [name], PTA Acute Rehabilitation Services Secure Chat Preferred  [phone]    [name] 02/01/2023, 3:06 PM

## 2023-02-01 NOTE — Progress Notes (Signed)
PROGRESS NOTE        PATIENT DETAILS Name: Jonathan Ibarra Age: 57 y.o. Sex: male Date of Birth: Mar 27, 1965 Admit Date: 12/27/2022 Admitting Physician Osvaldo Shipper, MD [id number], [name], MD  Brief Summary:  Patient is a 57 y.o.  male with history of liver cirrhosis, HTN, polysubstance abuse-recent hospitalization from 10/13-10/18 following trauma with fall resulting in a right tibia plateau fracture requiring ORIF on 10/14-presented with purulent discharge from his right knee-he was thought to have septic arthritis and subsequently admitted to the hospitalist service.  Significant events: 10/13-10/18 >> hospitalization-right tibial plateau fracture following a fall-s/p ORIF.   10/28>> purulent discharge from right knee-septic arthritis-admit to [facility] 10/30>> irrigation/debridement of right knee 11/11>> found to have purulence from right knee 11/13>> repeat irrigation/debridement  Significant studies: 08/02>> echo: EF 55-60% 10/28>> x-ray right knee: Unchanged lateral plate/screw fixation across comminuted fracture involving medial/lateral tibial plateaus, moderate knee joint effusion 10/29>> CXR: No active disease  Significant microbiology data: 10/29>> blood culture: 1/2-Corynebacterium (contamination) 10/30>> right knee abscess culture: Klebsiella pneumoniae-ESBL 10/31>> blood culture: No growth 11/13>> right knee abscess culture: No growth  Procedures: 10/30>> irrigation and debridement of right knee 11/13>> repeat irrigation/debridement.  Consults: Ortho  Infectious disease  Subjective:    No significant events overnight, bowel is regular, compliant with lactulose  Objective: Vitals: Blood pressure 122/80, pulse 83, temperature (!) 97.4 F (36.3 C), temperature source Oral, resp. rate 16, height 6' (1.829 m), weight 117.9 kg, SpO2 96%.   Exam:  He is awake, alert and appropriate, no apparent distress, right knee  bandaged   Assessment/Plan:  Septic arthritis right knee (ESBL Klebsiella pneumoniae)-with recent history of ORIF on 10/14 with hardware in place, being followed by orthopedics Dr. Jena Gauss and ID team. S/p I&D 10/30-VAC discontinued 11/4-unfortunately purulence was observed on 11/11 inspite of being on IV antibiotics-underwent repeat irrigation/debridement on 11/13 Antibiotics management per ID, continue with ertapenem, will need 4 to 6 weeks from most recent I&D on 11/13, ID will reassess again on 12/10 to determine length of treatment (if needs another 2 weeks of IV), and plan for suppressive omadacycline once done with IV antibiotics. -Orthopedic follow-up greatly appreciated - Not a candidate for outpatient IV therapies given history of polysubstance abuse.  PAF with RVR Rate controlled with oral metoprolol-continue midodrine for BP support Not a candidate for anticoagulation-in any events CHADS2-VASC score of 1  Liver cirrhosis-s/p TIPS Relatively well compensated Volume status relatively stable Continue with current dose lactulose as overall mentation appears to be appropriate.  He is having averaging 3 BMs per day which is appropriate. Diuretics on hold due to soft BP (on midodrine for BP support)  Polysubstance abuse Acknowledges ongoing meth/THC use and remote cocaine use-although UDS positive for cocaine Ongoing counseling  Obesity: Estimated body mass index is 35.26 kg/m as calculated from the following:   Height as of this encounter: 6' (1.829 m).   Weight as of this encounter: 117.9 kg.   Code status:   Code Status: Full Code   DVT Prophylaxis: SCDs Start: 12/28/22 0816   Family Communication: None at bedside   Disposition Plan: Status is: Inpatient Remains inpatient appropriate because: Severity of illness   Planned Discharge Destination:Home   Diet: Diet Order             Diet regular Room service appropriate? Yes; Fluid consistency: Thin  Diet  effective now                    MEDICATIONS: Scheduled Meds:  alteplase  2 mg Intracatheter Once   celecoxib  100 mg Oral BID   Chlorhexidine Gluconate Cloth  6 each Topical Daily   enoxaparin (LOVENOX) injection  60 mg Subcutaneous Q24H   folic acid  1 mg Oral Daily   lactulose  30 g Oral TID   metoprolol tartrate  50 mg Oral BID   midodrine  10 mg Oral TID WC   multivitamin with minerals  1 tablet Oral Daily   pantoprazole  40 mg Oral Daily   polyethylene glycol  17 g Oral Daily   sodium chloride flush  10-40 mL Intracatheter Q12H   sodium chloride flush  3 mL Intravenous Q12H   thiamine  100 mg Oral Daily   Vitamin D (Ergocalciferol)  50,000 Units Oral Q7 days   Continuous Infusions:  ertapenem 1 g (02/01/23 0920)   PRN Meds:.acetaminophen, bisacodyl, diltiazem, HYDROmorphone (DILAUDID) injection, lactulose, methocarbamol, mouth rinse, [DISCONTINUED] oxyCODONE **OR** oxyCODONE, polyethylene glycol, sodium chloride flush, sodium chloride flush   I have personally reviewed following labs and imaging studies  LABORATORY DATA:  Recent Labs  Lab 01/28/23 0401  WBC 3.1*  HGB 10.3*  HCT 33.0*  PLT 127*  MCV 94.3  MCH 29.4  MCHC 31.2  RDW 14.8    Recent Labs  Lab 01/28/23 0401  NA 139  K 4.3  CL 111  CO2 24  ANIONGAP 4*  GLUCOSE 95  BUN 19  CREATININE 0.61  AST 46*  ALT 39  ALKPHOS 127*  BILITOT 0.7  ALBUMIN 2.3*  CRP 0.7  AMMONIA 54*  CALCIUM 8.7*    No results found for: "CHOL", "HDL", "LDLCALC", "LDLDIRECT", "TRIG", "CHOLHDL"    Recent Labs  Lab 01/28/23 0401  CRP 0.7  AMMONIA 54*  CALCIUM 8.7*     MICROBIOLOGY: No results found for this or any previous visit (from the past 240 hour(s)).  RADIOLOGY STUDIES/RESULTS: No results found.   LOS: 35 days   Signature  -    Huey Bienenstock M.D on 02/01/2023 at 1:08 PM   -  To page go to [email or web address]

## 2023-02-01 NOTE — Progress Notes (Signed)
Mobility Specialist Progress Note:   02/01/23 1100  Mobility  Activity Ambulated with assistance in hallway  Level of Assistance Contact guard assist, steadying assist  Assistive Device Front wheel walker  Distance Ambulated (ft) 200 ft  RLE Weight Bearing TWB  Activity Response Tolerated well  Mobility Referral Yes  $Mobility charge 1 Mobility  Mobility Specialist Start Time (ACUTE ONLY) 1100  Mobility Specialist Stop Time (ACUTE ONLY) 1120  Mobility Specialist Time Calculation (min) (ACUTE ONLY) 20 min   Pt agreeable to mobility session. Required only ming assist for safety. Verbal cues needed to maintain RLE TWB. Pt left sitting EOB with all needs met.   Addison Lank Mobility Specialist Please contact via SecureChat or  Rehab office at [phone]

## 2023-02-02 DIAGNOSIS — I480 Paroxysmal atrial fibrillation: Secondary | ICD-10-CM | POA: Diagnosis not present

## 2023-02-02 DIAGNOSIS — M25461 Effusion, right knee: Secondary | ICD-10-CM

## 2023-02-02 DIAGNOSIS — T148XXA Other injury of unspecified body region, initial encounter: Secondary | ICD-10-CM | POA: Diagnosis not present

## 2023-02-02 DIAGNOSIS — I10 Essential (primary) hypertension: Secondary | ICD-10-CM | POA: Diagnosis not present

## 2023-02-02 NOTE — Plan of Care (Signed)
  Problem: Education: Goal: Understanding of medication regimen will improve Outcome: Progressing   

## 2023-02-02 NOTE — Plan of Care (Signed)
  Problem: Education: Goal: Knowledge of disease or condition will improve Outcome: Progressing Goal: Understanding of medication regimen will improve Outcome: Progressing Goal: Individualized Educational Video(s) Outcome: Progressing   Problem: Health Behavior/Discharge Planning: Goal: Ability to safely manage health-related needs after discharge will improve Outcome: Progressing   Problem: Education: Goal: Knowledge of General Education information will improve Description: Including pain rating scale, medication(s)/side effects and non-pharmacologic comfort measures Outcome: Progressing

## 2023-02-02 NOTE — Progress Notes (Signed)
PROGRESS NOTE        PATIENT DETAILS Name: Jonathan Ibarra Age: 57 y.o. Sex: male Date of Birth: 22-Nov-1965 Admit Date: 12/27/2022 Admitting Physician Osvaldo Shipper, MD [id number], [name], MD  Brief Summary:  Patient is a 57 y.o.  male with history of liver cirrhosis, HTN, polysubstance abuse-recent hospitalization from 10/13-10/18 following trauma with fall resulting in a right tibia plateau fracture requiring ORIF on 10/14-presented with purulent discharge from his right knee-he was thought to have septic arthritis and subsequently admitted to the hospitalist service.  Significant events: 10/13-10/18 >> hospitalization-right tibial plateau fracture following a fall-s/p ORIF.   10/28>> purulent discharge from right knee-septic arthritis-admit to [facility] 10/30>> irrigation/debridement of right knee 11/11>> found to have purulence from right knee 11/13>> repeat irrigation/debridement  Significant studies: 08/02>> echo: EF 55-60% 10/28>> x-ray right knee: Unchanged lateral plate/screw fixation across comminuted fracture involving medial/lateral tibial plateaus, moderate knee joint effusion 10/29>> CXR: No active disease  Significant microbiology data: 10/29>> blood culture: 1/2-Corynebacterium (contamination) 10/30>> right knee abscess culture: Klebsiella pneumoniae-ESBL 10/31>> blood culture: No growth 11/13>> right knee abscess culture: No growth  Procedures: 10/30>> irrigation and debridement of right knee 11/13>> repeat irrigation/debridement.  Consults: Ortho  Infectious disease  Subjective:    No complaints-lying comfortably in bed.  Objective: Vitals: Blood pressure 118/71, pulse 79, temperature 97.7 F (36.5 C), temperature source Oral, resp. rate 18, height 6' (1.829 m), weight 117.9 kg, SpO2 96%.   Exam: Awake alert Right knee in bandage.   Assessment/Plan: Septic arthritis right knee (ESBL Klebsiella pneumoniae)-with recent  history of ORIF on 10/14 with hardware in place-s/p I&D on 10/30 and again on 11/13 (Dr. Jena Gauss) Continue ertapenem-4-6 weeks plan from most recent I&D on 11/13 ID to reassess on 12/10 to determine if antibiotic needs to be extended further-with tentative plans for suppressive omadacycline once done with IV antibiotics. Orthopedic following Not a candidate for outpatient IV therapies given history of polysubstance abuse.   PAF with RVR Rate controlled with oral metoprolol-continue midodrine for BP support Not a candidate for anticoagulation-in any events CHADS2-VASC score of 1  Liver cirrhosis-s/p TIPS Relatively well compensated Volume status relatively stable Continue with current dose lactulose as overall mentation appears to be appropriate.  He is having averaging 3 BMs per day which is appropriate. Diuretics on hold due to soft BP (on midodrine for BP support)  Polysubstance abuse Acknowledges ongoing meth/THC use and remote cocaine use-although UDS positive for cocaine Ongoing counseling  Obesity: Estimated body mass index is 35.26 kg/m as calculated from the following:   Height as of this encounter: 6' (1.829 m).   Weight as of this encounter: 117.9 kg.   Code status:   Code Status: Full Code   DVT Prophylaxis: SCDs Start: 12/28/22 0816   Family Communication: None at bedside   Disposition Plan: Status is: Inpatient Remains inpatient appropriate because: Severity of illness   Planned Discharge Destination:Home   Diet: Diet Order             Diet regular Room service appropriate? Yes; Fluid consistency: Thin  Diet effective now                    MEDICATIONS: Scheduled Meds:  alteplase  2 mg Intracatheter Once   celecoxib  100 mg Oral BID   Chlorhexidine Gluconate Cloth  6 each Topical Daily  enoxaparin (LOVENOX) injection  60 mg Subcutaneous Q24H   folic acid  1 mg Oral Daily   lactulose  30 g Oral TID   metoprolol tartrate  50 mg Oral BID    midodrine  10 mg Oral TID WC   multivitamin with minerals  1 tablet Oral Daily   pantoprazole  40 mg Oral Daily   polyethylene glycol  17 g Oral Daily   sodium chloride flush  10-40 mL Intracatheter Q12H   sodium chloride flush  3 mL Intravenous Q12H   thiamine  100 mg Oral Daily   Vitamin D (Ergocalciferol)  50,000 Units Oral Q7 days   Continuous Infusions:  ertapenem 1 g (02/02/23 0809)   PRN Meds:.acetaminophen, bisacodyl, diltiazem, HYDROmorphone (DILAUDID) injection, lactulose, methocarbamol, mouth rinse, [DISCONTINUED] oxyCODONE **OR** oxyCODONE, polyethylene glycol, sodium chloride flush, sodium chloride flush   I have personally reviewed following labs and imaging studies  LABORATORY DATA:  Recent Labs  Lab 01/28/23 0401 02/01/23 1212  WBC 3.1* 2.8*  HGB 10.3* 10.8*  HCT 33.0* 34.3*  PLT 127* 111*  MCV 94.3 93.0  MCH 29.4 29.3  MCHC 31.2 31.5  RDW 14.8 14.8    Recent Labs  Lab 01/28/23 0401  NA 139  K 4.3  CL 111  CO2 24  ANIONGAP 4*  GLUCOSE 95  BUN 19  CREATININE 0.61  AST 46*  ALT 39  ALKPHOS 127*  BILITOT 0.7  ALBUMIN 2.3*  CRP 0.7  AMMONIA 54*  CALCIUM 8.7*    No results found for: "CHOL", "HDL", "LDLCALC", "LDLDIRECT", "TRIG", "CHOLHDL"    Recent Labs  Lab 01/28/23 0401  CRP 0.7  AMMONIA 54*  CALCIUM 8.7*     MICROBIOLOGY: No results found for this or any previous visit (from the past 240 hour(s)).  RADIOLOGY STUDIES/RESULTS: No results found.   LOS: 36 days   Signature  -    Jeoffrey Massed M.D on 02/02/2023 at 12:43 PM   -  To page go to [email or web address]

## 2023-02-03 ENCOUNTER — Inpatient Hospital Stay (HOSPITAL_COMMUNITY): Payer: Commercial Managed Care - HMO

## 2023-02-03 ENCOUNTER — Other Ambulatory Visit (HOSPITAL_COMMUNITY): Payer: Self-pay

## 2023-02-03 DIAGNOSIS — I480 Paroxysmal atrial fibrillation: Secondary | ICD-10-CM | POA: Diagnosis not present

## 2023-02-03 DIAGNOSIS — I10 Essential (primary) hypertension: Secondary | ICD-10-CM | POA: Diagnosis not present

## 2023-02-03 DIAGNOSIS — M25461 Effusion, right knee: Secondary | ICD-10-CM | POA: Diagnosis not present

## 2023-02-03 DIAGNOSIS — T8453XA Infection and inflammatory reaction due to internal right knee prosthesis, initial encounter: Secondary | ICD-10-CM

## 2023-02-03 DIAGNOSIS — T148XXA Other injury of unspecified body region, initial encounter: Secondary | ICD-10-CM | POA: Diagnosis not present

## 2023-02-03 LAB — SEDIMENTATION RATE: Sed Rate: 17 mm/h — ABNORMAL HIGH (ref 0–16)

## 2023-02-03 LAB — C-REACTIVE PROTEIN: CRP: 0.7 mg/dL (ref <1.0)

## 2023-02-03 NOTE — Progress Notes (Signed)
    [facility]   Reason for visit: Follow up on right knee infection  Interval History: seen by orthopedics and CT scan ordered.  No acute events.  CRP, ESR stable/normal or near normal.   Physical Exam: Constitutional:  Vitals:   02/03/23 0515 02/03/23 0815  BP:  123/82  Pulse: 87 89  Resp: 20 18  Temp: 98.1 F (36.7 C) 98.3 F (36.8 C)  SpO2: 98%    patient appears in NAD Respiratory: Normal respiratory effort; CTA B MS: right knee with no warmth  Review of Systems: Constitutional: negative for fevers and chills  Lab Results  Component Value Date   WBC 2.8 (L) 02/01/2023   HGB 10.8 (L) 02/01/2023   HCT 34.3 (L) 02/01/2023   MCV 93.0 02/01/2023   PLT 111 (L) 02/01/2023    Lab Results  Component Value Date   CREATININE 0.61 01/28/2023   BUN 19 01/28/2023   NA 139 01/28/2023   K 4.3 01/28/2023   CL 111 01/28/2023   CO2 24 01/28/2023    Lab Results  Component Value Date   ALT 39 01/28/2023   AST 46 (H) 01/28/2023   ALKPHOS 127 (H) 01/28/2023     Microbiology: No results found for this or any previous visit (from the past 240 hour(s)).  Impression/Plan:  1. Right knee infection - adjacent hardware.  CT scan today per orthopedics with continued drainage.   Continue with antibiotics Plan for oral omadacycline after completion of IV antibiotics.  2.  Liver cirrhosis - on treatment with lactulose.  No encephalopathy noted clinically.   3.  Hepatitis C - Ab positive but HCV RNA negative 10/15.  No treatment indicated.

## 2023-02-03 NOTE — Plan of Care (Signed)
  Problem: Education: Goal: Knowledge of disease or condition will improve Outcome: Progressing Goal: Understanding of medication regimen will improve Outcome: Progressing Goal: Individualized Educational Video(s) Outcome: Progressing   Problem: Health Behavior/Discharge Planning: Goal: Ability to safely manage health-related needs after discharge will improve Outcome: Progressing   Problem: Education: Goal: Knowledge of General Education information will improve Description: Including pain rating scale, medication(s)/side effects and non-pharmacologic comfort measures Outcome: Progressing   Problem: Health Behavior/Discharge Planning: Goal: Ability to manage health-related needs will improve Outcome: Progressing   Problem: Clinical Measurements: Goal: Ability to maintain clinical measurements within normal limits will improve Outcome: Progressing Goal: Diagnostic test results will improve Outcome: Progressing Goal: Respiratory complications will improve Outcome: Progressing   Problem: Coping: Goal: Level of anxiety will decrease Outcome: Progressing   Problem: Elimination: Goal: Will not experience complications related to bowel motility Outcome: Progressing   Problem: Pain Management: Goal: General experience of comfort will improve Outcome: Progressing   Problem: Safety: Goal: Ability to remain free from injury will improve Outcome: Progressing   Problem: Skin Integrity: Goal: Risk for impaired skin integrity will decrease Outcome: Progressing

## 2023-02-03 NOTE — Progress Notes (Signed)
Occupational Therapy Treatment/Discharge Patient Details Name: Jonathan Ibarra MRN: [id number] DOB: 08/26/1965 Today's Date: 02/03/2023   History of present illness Pt is a 57 y/o male presenting 10/28 to [facility] with severe right knee pain and drainage after a fall the night PTA, he is now s/p R knee I&D on 12/29/22 and 01/12/23. He recently had a R tibial ORIF on 12/13/22 after a falls incidence. WUJ:WJXBJYN abuse, [name], HTN, liver cirrhosis, polysubstance abuse.   OT comments  Pt doing well, had just finished working with mobility specialists. Pt able to fully participate in therapy. Pt compelted ADLs at bedside with set up, good safety awareness, good ROM/strength. Pt able to maintain RLE TWB precautions throughout session. Pt able to change bed sheets with set up while maintaining balance, WB precautions, safety awareness. Pt able to pick up items off the floor from standing position and stand up to 20 minutes without a rest break. Pt has no further acute care needs, mobility to follow acutely and HHOT for follow up to maximize safety/independence in home setting recommended. Tub bench recommended for return home.       If plan is discharge home, recommend the following:  Assistance with cooking/housework;Help with stairs or ramp for entrance;Assist for transportation;[name];A little help with walking and/or transfers   Equipment Recommendations  Tub/shower bench    Recommendations for Other Services      Precautions / Restrictions Precautions Precautions: Fall Restrictions Weight Bearing Restrictions: Yes RLE Weight Bearing: Touchdown weight bearing       Mobility Bed Mobility Overal bed mobility: Independent                  Transfers Overall transfer level: Modified independent Equipment used: Rolling walker (2 wheels)               General transfer comment: Pt demonstrates ability to complete mobility around room with  RW without need for assistance.     Balance Overall balance assessment: Modified Independent                                         ADL either performed or assessed with clinical judgement   ADL Overall ADL's : Needs assistance/impaired Eating/Feeding: Independent   Grooming: Set up;Standing;Oral care   Upper Body Bathing: Set up;Sitting   Lower Body Bathing: Set up   Upper Body Dressing : Set up   Lower Body Dressing: Set up   Toilet Transfer: Modified Independent;Rolling walker (2 wheels)   Toileting- Clothing Manipulation and Hygiene: Modified independent       Functional mobility during ADLs: Modified independent General ADL Comments: Pt demonstrates ability to complete ADLs with set up, ambulate to restroom with RW as needed, maintains TDWB while standing and changing bed sheets, able to pick up items off the floor from standing position, no LOB, good safety awareness. Pt stood for 15 minutes no rest breaks.    Extremity/Trunk Assessment Upper Extremity Assessment Upper Extremity Assessment: Overall WFL for tasks assessed            Vision       Perception     Praxis      Cognition Arousal: Alert Behavior During Therapy: WFL for tasks assessed/performed, Agitated Overall Cognitive Status: Within Functional Limits for tasks assessed  Exercises      Shoulder Instructions       General Comments      Pertinent Vitals/ Pain       Pain Assessment Pain Assessment: Faces Faces Pain Scale: Hurts a little bit Pain Location: R knee Pain Descriptors / Indicators: Discomfort, Aching Pain Intervention(s): Monitored during session  Home Living                                          Prior Functioning/Environment              Frequency  Min 1X/week        Progress Toward Goals  OT Goals(current goals can now be found in the care plan section)   Progress towards OT goals: Progressing toward goals  Acute Rehab OT Goals Patient Stated Goal: to return home OT Goal Formulation: All assessment and education complete, DC therapy Time For Goal Achievement: 02/08/23 Potential to Achieve Goals: Good ADL Goals Pt Will Perform Grooming: with modified independence;sitting Pt Will Perform Lower Body Bathing: with modified independence;sitting/lateral leans Pt Will Perform Lower Body Dressing: with modified independence;sitting/lateral leans Pt Will Transfer to Toilet: ambulating;with supervision;bedside commode Pt Will Perform Toileting - Clothing Manipulation and hygiene: sit to/from stand;with modified independence;sitting/lateral leans Pt Will Perform Tub/Shower Transfer: Shower transfer;Independently;ambulating;rolling walker Pt/caregiver will Perform Home Exercise Program: Both right and left upper extremity;Increased ROM;Increased strength Additional ADL Goal #1: pt will perform x3 functional tasks with supervision adhering to WB precautions  Plan      Co-evaluation                 AM-PAC OT "6 Clicks" Daily Activity     Outcome Measure   Help from another person eating meals?: None Help from another person taking care of personal grooming?: None Help from another person toileting, which includes using toliet, bedpan, or urinal?: A Little Help from another person bathing (including washing, rinsing, drying)?: A Little Help from another person to put on and taking off regular upper body clothing?: A Little Help from another person to put on and taking off regular lower body clothing?: A Little 6 Click Score: 20    End of Session Equipment Utilized During Treatment: Rolling walker (2 wheels)  OT Visit Diagnosis: Pain;Unsteadiness on feet (R26.81);Other abnormalities of gait and mobility (R26.89);History of falling (Z91.81) Pain - Right/Left: Right Pain - part of body: Knee   Activity Tolerance Patient tolerated treatment  well   Patient Left in bed;with call bell/phone within reach   Nurse Communication Mobility status        Time: 1610-9604 OT Time Calculation (min): 28 min  Charges: OT General Charges $OT Visit: 1 Visit OT Treatments $Self Care/Home Management : 8-22 mins $Therapeutic Activity: 8-22 mins  Jonathan Ibarra, OTR/L   Jonathan Ibarra 02/03/2023, 4:42 PM

## 2023-02-03 NOTE — Progress Notes (Signed)
Orthopaedic Trauma Progress Note  SUBJECTIVE: Doing ok this morning.  Patient states he does not feel good but cannot give any specific details.  Denies any fever, chills, nausea, vomiting.  Overall states he just feels "blah".  Inflammatory markers rechecked this a.m., CRP WNL.  ESR minimally elevated at 17.  Patient has been undergoing daily dressing changes for the right knee.  Continues to have serosanguineous drainage despite being on appropriate antibiotics.  Patient thinks the drainage has increased slightly over the past several days  OBJECTIVE:  Vitals:   02/03/23 0515 02/03/23 0815  BP:  123/82  Pulse: 87 89  Resp: 20 18  Temp: 98.1 F (36.7 C) 98.3 F (36.8 C)  SpO2: 98%     General: Sitting up in bed, no acute distress Respiratory: No increased work of breathing.  Right lower extremity:  Dressing changed, incision as below. Proximal incision healed. Distal incision with continued serosanguinous drainage.  Unable to express any purulent fluid.  One area of the distal incision with some active drainage on exam.  Swelling remains stable.  Tolerates gentle knee ROM. Ankle dorsiflexion/plantarflexion is intact.  Soreness to the calf but no areas of significant pain.  +EHL/FHL intact.  Neurovascularly intact.     IMAGING: Repeat imaging right knee performed 01/12/2023 shows stable appearance to fixation.  No erosive changes noted.  LABS:  Results for orders placed or performed during the hospital encounter of 12/27/22 (from the past 24 hour(s))  C-reactive protein     Status: None   Collection Time: 02/03/23  4:36 AM  Result Value Ref Range   CRP 0.7 <1.0 mg/dL  Sedimentation rate     Status: Abnormal   Collection Time: 02/03/23  4:36 AM  Result Value Ref Range   Sed Rate 17 (H) 0 - 16 mm/hr       ASSESSMENT: Hiroto Kemna is a 57 y.o. male s/p IRRIGATION AND DEBRIDEMENT RIGHT KNEE on 01/12/2023 and 12/29/22  Previous ORIF of right tibial plateau fracture with  repair of right lateral meniscus tear on 12/13/2022  PLAN: Weightbearing: TDWB RLE ROM: Okay for range of motion of the knee as tolerated Incisional and dressing care: Dressing changed today. Continue daily dressing changes by nursing Showering: Ok to shower  Orthopedic device(s): None Pain management: continue current regimen VTE prophylaxis: Lovenox.  SCDs ID: Per infectious disease Foley/Lines:  No foley, KVO IVFs Impediments to Fracture Healing:  Infection.  Vitamin D level 28, continue supplementation Dispo: Continue therapies as tolerated.  Patient unfortunately continues to drain from his right knee incision.  Will obtain CT scan today to evaluate the proximal tibia bone as well as the knee joint.  Will leave sutures in place for a few more days as patient is at high risk for dehiscence.  Continue IV antibiotics per infectious disease team.  Will follow-up with patient tomorrow a.m. once CT scan completed.  Remain hopeful that no additional I&D's will be required.  Follow - up plan: 2 weeks after d/c    Contact information:  Truitt Merle MD, Thyra Breed PA-C. After hours and holidays please check [email or web address] for group call information for [facility]   Thompson Caul, PA-C [phone] (office) [email or web address]

## 2023-02-03 NOTE — Progress Notes (Signed)
Mobility Specialist Progress Note;   02/03/23 1545  Mobility  Activity Ambulated with assistance in hallway  Level of Assistance Contact guard assist, steadying assist  Assistive Device Front wheel walker  Distance Ambulated (ft) 300 ft  RLE Weight Bearing TWB  Activity Response Tolerated well  Mobility Referral Yes  Mobility visit 1 Mobility  Mobility Specialist Start Time (ACUTE ONLY) 1545  Mobility Specialist Stop Time (ACUTE ONLY) 1605  Mobility Specialist Time Calculation (min) (ACUTE ONLY) 20 min   Pt agreeable to mobility with encouragement. Required MinG assistance during ambulation for safety. Verbal cues required for TWB precautions while ambulating. No c/o throughout session. Pt returned back to bed with all needs met.   Caesar Bookman Mobility Specialist Please contact via SecureChat or Rehab Office [phone]

## 2023-02-03 NOTE — Progress Notes (Signed)
PROGRESS NOTE        PATIENT DETAILS Name: Jonathan Ibarra Age: 57 y.o. Sex: male Date of Birth: 28-Jan-1966 Admit Date: 12/27/2022 Admitting Physician Osvaldo Shipper, MD WUJ:[id number], [name], MD  Brief Summary:  Patient is a 57 y.o.  male with history of liver cirrhosis, HTN, polysubstance abuse-recent hospitalization from 10/13-10/18 following trauma with fall resulting in a right tibia plateau fracture requiring ORIF on 10/14-presented with purulent discharge from his right knee-he was thought to have septic arthritis and subsequently admitted to the hospitalist service.  Significant events: 10/13-10/18 >> hospitalization-right tibial plateau fracture following a fall-s/p ORIF.   10/28>> purulent discharge from right knee-septic arthritis-admit to [facility] 10/30>> irrigation/debridement of right knee 11/11>> found to have purulence from right knee 11/13>> repeat irrigation/debridement  Significant studies: 08/02>> echo: EF 55-60% 10/28>> x-ray right knee: Unchanged lateral plate/screw fixation across comminuted fracture involving medial/lateral tibial plateaus, moderate knee joint effusion 10/29>> CXR: No active disease  Significant microbiology data: 10/29>> blood culture: 1/2-Corynebacterium (contamination) 10/30>> right knee abscess culture: Klebsiella pneumoniae-ESBL 10/31>> blood culture: No growth 11/13>> right knee abscess culture: No growth  Procedures: 10/30>> irrigation and debridement of right knee 11/13>> repeat irrigation/debridement.  Consults: Ortho  Infectious disease  Subjective:    No complaints-lying comfortably in bed.  Objective: Vitals: Blood pressure 123/82, pulse 89, temperature 98.3 F (36.8 C), temperature source Oral, resp. rate 18, height 6' (1.829 m), weight 117.9 kg, SpO2 98%.   Exam: Awake alert Right knee in bandage but dressing appears to be soaked.   Assessment/Plan: Septic arthritis right knee (ESBL  Klebsiella pneumoniae)-with recent history of ORIF on 10/14 with hardware in place-s/p I&D on 10/30 and again on 11/13 (Dr. Jena Gauss) Continue ertapenem-4-6 weeks from most recent I&D on 11/13 Some clear discharge from right knee today-orthopedics planning on CT Will need ID follow-up around 12/10 per prior notes. Not a candidate for outpatient IV therapies given history of polysubstance abuse.  PAF with RVR Rate controlled with oral metoprolol-continue midodrine for BP support Not a candidate for anticoagulation-in any events CHADS2-VASC score of 1  Liver cirrhosis-s/p TIPS Relatively well compensated Volume status relatively stable Continue with current dose lactulose as overall mentation appears to be appropriate.  He is having averaging 3 BMs per day which is appropriate. Diuretics on hold due to soft BP (on midodrine for BP support)  Polysubstance abuse Acknowledges ongoing meth/THC use and remote cocaine use-although UDS positive for cocaine Ongoing counseling  Obesity: Estimated body mass index is 35.26 kg/m as calculated from the following:   Height as of this encounter: 6' (1.829 m).   Weight as of this encounter: 117.9 kg.   Code status:   Code Status: Full Code   DVT Prophylaxis: SCDs Start: 12/28/22 0816   Family Communication: None at bedside   Disposition Plan: Status is: Inpatient Remains inpatient appropriate because: Severity of illness   Planned Discharge Destination:Home   Diet: Diet Order             Diet regular Room service appropriate? Yes; Fluid consistency: Thin  Diet effective now                    MEDICATIONS: Scheduled Meds:  alteplase  2 mg Intracatheter Once   celecoxib  100 mg Oral BID   Chlorhexidine Gluconate Cloth  6 each Topical Daily   enoxaparin (  LOVENOX) injection  60 mg Subcutaneous Q24H   folic acid  1 mg Oral Daily   lactulose  30 g Oral TID   metoprolol tartrate  50 mg Oral BID   midodrine  10 mg Oral TID WC    multivitamin with minerals  1 tablet Oral Daily   pantoprazole  40 mg Oral Daily   polyethylene glycol  17 g Oral Daily   sodium chloride flush  10-40 mL Intracatheter Q12H   sodium chloride flush  3 mL Intravenous Q12H   thiamine  100 mg Oral Daily   Vitamin D (Ergocalciferol)  50,000 Units Oral Q7 days   Continuous Infusions:  ertapenem 1 g (02/03/23 0913)   PRN Meds:.acetaminophen, bisacodyl, diltiazem, HYDROmorphone (DILAUDID) injection, lactulose, methocarbamol, mouth rinse, [DISCONTINUED] oxyCODONE **OR** oxyCODONE, polyethylene glycol, sodium chloride flush, sodium chloride flush   I have personally reviewed following labs and imaging studies  LABORATORY DATA:  Recent Labs  Lab 01/28/23 0401 02/01/23 1212  WBC 3.1* 2.8*  HGB 10.3* 10.8*  HCT 33.0* 34.3*  PLT 127* 111*  MCV 94.3 93.0  MCH 29.4 29.3  MCHC 31.2 31.5  RDW 14.8 14.8    Recent Labs  Lab 01/28/23 0401 02/03/23 0436  NA 139  --   K 4.3  --   CL 111  --   CO2 24  --   ANIONGAP 4*  --   GLUCOSE 95  --   BUN 19  --   CREATININE 0.61  --   AST 46*  --   ALT 39  --   ALKPHOS 127*  --   BILITOT 0.7  --   ALBUMIN 2.3*  --   CRP 0.7 0.7  AMMONIA 54*  --   CALCIUM 8.7*  --     No results found for: "CHOL", "HDL", "LDLCALC", "LDLDIRECT", "TRIG", "CHOLHDL"    Recent Labs  Lab 01/28/23 0401 02/03/23 0436  CRP 0.7 0.7  AMMONIA 54*  --   CALCIUM 8.7*  --      MICROBIOLOGY: No results found for this or any previous visit (from the past 240 hour(s)).  RADIOLOGY STUDIES/RESULTS: No results found.   LOS: 37 days   Signature  -    Jeoffrey Massed M.D on 02/03/2023 at 10:59 AM   -  To page go to [email or web address]

## 2023-02-04 ENCOUNTER — Other Ambulatory Visit (HOSPITAL_COMMUNITY): Payer: Self-pay

## 2023-02-04 DIAGNOSIS — T847XXD Infection and inflammatory reaction due to other internal orthopedic prosthetic devices, implants and grafts, subsequent encounter: Secondary | ICD-10-CM

## 2023-02-04 DIAGNOSIS — T148XXA Other injury of unspecified body region, initial encounter: Secondary | ICD-10-CM

## 2023-02-04 DIAGNOSIS — I10 Essential (primary) hypertension: Secondary | ICD-10-CM | POA: Diagnosis not present

## 2023-02-04 DIAGNOSIS — I4891 Unspecified atrial fibrillation: Secondary | ICD-10-CM | POA: Diagnosis not present

## 2023-02-04 LAB — CBC
HCT: 33.2 % — ABNORMAL LOW (ref 39.0–52.0)
Hemoglobin: 10.4 g/dL — ABNORMAL LOW (ref 13.0–17.0)
MCH: 29.1 pg (ref 26.0–34.0)
MCHC: 31.3 g/dL (ref 30.0–36.0)
MCV: 93 fL (ref 80.0–100.0)
Platelets: 100 10*3/uL — ABNORMAL LOW (ref 150–400)
RBC: 3.57 MIL/uL — ABNORMAL LOW (ref 4.22–5.81)
RDW: 15 % (ref 11.5–15.5)
WBC: 2.6 10*3/uL — ABNORMAL LOW (ref 4.0–10.5)
nRBC: 0 % (ref 0.0–0.2)

## 2023-02-04 LAB — COMPREHENSIVE METABOLIC PANEL
ALT: 37 U/L (ref 0–44)
AST: 40 U/L (ref 15–41)
Albumin: 2.4 g/dL — ABNORMAL LOW (ref 3.5–5.0)
Alkaline Phosphatase: 131 U/L — ABNORMAL HIGH (ref 38–126)
Anion gap: 6 (ref 5–15)
BUN: 17 mg/dL (ref 6–20)
CO2: 27 mmol/L (ref 22–32)
Calcium: 8.8 mg/dL — ABNORMAL LOW (ref 8.9–10.3)
Chloride: 106 mmol/L (ref 98–111)
Creatinine, Ser: 0.64 mg/dL (ref 0.61–1.24)
GFR, Estimated: >60 mL/min (ref >60)
Glucose, Bld: 79 mg/dL (ref 70–99)
Potassium: 4.3 mmol/L (ref 3.5–5.1)
Sodium: 139 mmol/L (ref 135–145)
Total Bilirubin: 1 mg/dL (ref <1.2)
Total Protein: 5.9 g/dL — ABNORMAL LOW (ref 6.5–8.1)

## 2023-02-04 MED ORDER — METHOCARBAMOL 750 MG PO TABS
750.0000 mg | ORAL_TABLET | Freq: Three times a day (TID) | ORAL | 0 refills | Status: DC | PRN
  Filled 2023-02-04: qty 30, 10d supply, fill #0

## 2023-02-04 MED ORDER — VITAMIN D (ERGOCALCIFEROL) 1.25 MG (50000 UNIT) PO CAPS
50000.0000 [IU] | ORAL_CAPSULE | ORAL | 0 refills | Status: DC
  Filled 2023-02-04: qty 5, 35d supply, fill #0

## 2023-02-04 MED ORDER — METOPROLOL TARTRATE 25 MG PO TABS
25.0000 mg | ORAL_TABLET | Freq: Two times a day (BID) | ORAL | 0 refills | Status: DC
  Filled 2023-02-04: qty 120, 60d supply, fill #0

## 2023-02-04 MED ORDER — FOLIC ACID 1 MG PO TABS
1.0000 mg | ORAL_TABLET | Freq: Every day | ORAL | 0 refills | Status: DC
  Filled 2023-02-04: qty 60, 60d supply, fill #0

## 2023-02-04 MED ORDER — OMADACYCLINE TOSYLATE 150 MG PO TABS
300.0000 mg | ORAL_TABLET | Freq: Every day | ORAL | 0 refills | Status: DC
  Filled 2023-02-04: qty 42, 21d supply, fill #0

## 2023-02-04 MED ORDER — LACTULOSE 10 GM/15ML PO SOLN
20.0000 g | Freq: Three times a day (TID) | ORAL | 0 refills | Status: DC
  Filled 2023-02-04: qty 2838, 32d supply, fill #0

## 2023-02-04 MED ORDER — THIAMINE HCL 100 MG PO TABS
100.0000 mg | ORAL_TABLET | Freq: Every day | ORAL | 0 refills | Status: DC
  Filled 2023-02-04: qty 60, 60d supply, fill #0

## 2023-02-04 MED ORDER — OXYCODONE HCL 10 MG PO TABS
10.0000 mg | ORAL_TABLET | Freq: Four times a day (QID) | ORAL | 0 refills | Status: DC | PRN
  Filled 2023-02-04: qty 25, 5d supply, fill #0

## 2023-02-04 MED ORDER — OMADACYCLINE TOSYLATE 150 MG PO TABS: 300.0000 mg | ORAL_TABLET | Freq: Every day | ORAL | Status: DC

## 2023-02-04 MED ORDER — MIDODRINE HCL 10 MG PO TABS
10.0000 mg | ORAL_TABLET | Freq: Three times a day (TID) | ORAL | 0 refills | Status: DC
  Filled 2023-02-04: qty 90, 30d supply, fill #0

## 2023-02-04 NOTE — Discharge Summary (Addendum)
PATIENT DETAILS Name: Jonathan Ibarra Age: 57 y.o. Sex: male Date of Birth: 1965/10/19 MRN: [id number]. Admitting Physician: Osvaldo Shipper, MD NWG:[id number], [name], MD  Admit Date: 12/27/2022 Discharge date: 02/04/2023  Recommendations for Outpatient Follow-up:  Follow up with PCP in 1-2 weeks Please obtain CMP/CBC in one week Please ensure follow up with infectious disease/orthopedics  Admitted From:  Home  Disposition: Home health   Discharge Condition: good  CODE STATUS:   Code Status: Full Code   Diet recommendation:  Diet Order             Diet - low sodium heart healthy           Diet regular Room service appropriate? Yes; Fluid consistency: Thin  Diet effective now                    Brief Summary: Patient is a 57 y.o.  male with history of liver cirrhosis, HTN, polysubstance abuse-recent hospitalization from 10/13-10/18 following trauma with fall resulting in a right tibia plateau fracture requiring ORIF on 10/14-presented with purulent discharge from his right knee-he was thought to have septic arthritis and subsequently admitted to the hospitalist service.   Significant events: 10/13-10/18 >> hospitalization-right tibial plateau fracture following a fall-s/p ORIF.   10/28>> purulent discharge from right knee-septic arthritis-admit to [facility] 10/30>> irrigation/debridement of right knee 11/11>> found to have purulence from right knee 11/13>> repeat irrigation/debridement   Significant studies: 08/02>> echo: EF 55-60% 10/28>> x-ray right knee: Unchanged lateral plate/screw fixation across comminuted fracture involving medial/lateral tibial plateaus, moderate knee joint effusion 10/29>> CXR: No active disease 12/05>> CT right knee: Moderate size complex knee joint effusion   Significant microbiology data: 10/29>> blood culture: 1/2-Corynebacterium (contamination) 10/30>> right knee abscess culture: Klebsiella pneumoniae-ESBL 10/31>> blood culture:  No growth 11/13>> right knee abscess culture: No growth   Procedures: 10/30>> irrigation and debridement of right knee 11/13>> repeat irrigation/debridement.   Consults: Ortho  Infectious disease  Brief Hospital Course: Septic arthritis right knee (ESBL Klebsiella pneumoniae)-with recent history of ORIF on 10/14 with hardware in place-s/p I&D on 10/30 and again on 11/13 (Dr. Jena Gauss) Managed with IV ertapenem-repeat CT knee on 12/5-shows a small effusion-discussed with orthopedics-no further plans for any surgical debridement/drainage.  Okay to discharge from the point of view-they have him on a portable wound VAC to his knee.  Orthopedics has arranged for outpatient follow up on 12/10-Home health will be arranged.  Wound VAC will be removed by orthopedics on 12/10 at follow-up.  Discussed with infectious disease-plan is to transition suppressive omadacycline on discharge.  Medication will be supplied to the patient prior to discharge-outpatient follow-up with infectious disease has been arranged.   Patient has provided a few days of oxycodone for pain control-further refills will be have to be done through PCPs office.  PAF with RVR Rate controlled with oral metoprolol-continue midodrine for BP support Not a candidate for anticoagulation-in any events CHADS2-VASC score of 1   Liver cirrhosis-s/p TIPS Relatively well compensated Volume status relatively stable Continue with current dose lactulose as overall mentation appears to be appropriate.  He is having averaging 3 BMs per day which is appropriate. Diuretics on hold due to soft BP (on midodrine for BP support)-this can be reassessed by PCP in the outpatient setting.   Polysubstance abuse Acknowledges ongoing meth/THC use and remote cocaine use-although UDS positive for cocaine Ongoing counseling   Obesity: Estimated body mass index is 35.26 kg/m as calculated from the following:  Height as of this encounter: 6' (1.829 m).    Weight as of this encounter: 117.9 kg.    Discharge Diagnoses:  Principal Problem:   Wound infection complicating hardware, subsequent encounter Active Problems:   Atrial fibrillation with rapid ventricular response (HCC)   Essential hypertension   Knee effusion, right   Septic arthritis of knee, right (HCC)   Drainage from wound   Discharge Instructions:  Activity:  As tolerated with Full fall precautions use walker/cane & assistance as needed   Discharge Instructions     Diet - low sodium heart healthy   Complete by: As directed    Discharge instructions   Complete by: As directed    Follow with Primary MD  Candi Leash, MD in 1-2 weeks  Follow with ID/Ortho clinic as instructed  Please get a complete blood count and chemistry panel checked by your Primary MD at your next visit, and again as instructed by your Primary MD.  Get Medicines reviewed and adjusted: Please take all your medications with you for your next visit with your Primary MD  Laboratory/radiological data: Please request your Primary MD to go over all hospital tests and procedure/radiological results at the follow up, please ask your Primary MD to get all Hospital records sent to his/her office.  In some cases, they will be blood work, cultures and biopsy results pending at the time of your discharge. Please request that your primary care M.D. follows up on these results.  Also Note the following: If you experience worsening of your admission symptoms, develop shortness of breath, life threatening emergency, suicidal or homicidal thoughts you must seek medical attention immediately by calling 911 or calling your MD immediately  if symptoms less severe.  You must read complete instructions/literature along with all the possible adverse reactions/side effects for all the Medicines you take and that have been prescribed to you. Take any new Medicines after you have completely understood and accpet all the  possible adverse reactions/side effects.   Do not drive when taking Pain medications or sleeping medications (Benzodaizepines)  Do not take more than prescribed Pain, Sleep and Anxiety Medications. It is not advisable to combine anxiety,sleep and pain medications without talking with your primary care practitioner  Special Instructions: If you have smoked or chewed Tobacco  in the last 2 yrs please stop smoking, stop any regular Alcohol  and or any Recreational drug use.  Wear Seat belts while driving.  Please note: You were cared for by a hospitalist during your hospital stay. Once you are discharged, your primary care physician will handle any further medical issues. Please note that NO REFILLS for any discharge medications will be authorized once you are discharged, as it is imperative that you return to your primary care physician (or establish a relationship with a primary care physician if you do not have one) for your post hospital discharge needs so that they can reassess your need for medications and monitor your lab values.   Discharge wound care:   Complete by: As directed    Continue Portable wound vac x 1 week   Increase activity slowly   Complete by: As directed       Allergies as of 02/04/2023       Reactions   Tylenol [acetaminophen] Other (See Comments)   Impacts liver        Medication List     STOP taking these medications    furosemide 40 MG tablet Commonly known as: LASIX  spironolactone 25 MG tablet Commonly known as: ALDACTONE       TAKE these medications    folic acid 1 MG tablet Commonly known as: FOLVITE Take 1 tablet (1 mg total) by mouth daily.   lactulose 10 GM/15ML solution Commonly known as: CHRONULAC Take 30 mLs (20 g total) by mouth 3 (three) times daily. What changed: when to take this   methocarbamol 750 MG tablet Commonly known as: ROBAXIN Take 1 tablet (750 mg total) by mouth every 8 (eight) hours as needed for muscle  spasms.   metoprolol tartrate 25 MG tablet Commonly known as: LOPRESSOR Take 1 tablet (25 mg total) by mouth 2 (two) times daily. What changed: when to take this   midodrine 10 MG tablet Commonly known as: PROAMATINE Take 1 tablet (10 mg total) by mouth 3 (three) times daily with meals.   Omadacycline Tosylate 150 MG Tabs Take 2 tablets (300 mg total) by mouth daily.   Oxycodone HCl 10 MG Tabs Take 1 tablet (10 mg total) by mouth every 6 (six) hours as needed for moderate pain (pain score 4-6). What changed:  medication strength how much to take when to take this   polyethylene glycol powder 17 GM/SCOOP powder Commonly known as: SB Polyethylene Glycol 3350 Take 1 capful (17 g) by mouth daily.   thiamine 100 MG tablet Commonly known as: VITAMIN B1 Take 1 tablet (100 mg total) by mouth daily.   Vitamin D (Ergocalciferol) 1.25 MG (50000 UNIT) Caps capsule Commonly known as: DRISDOL Take 1 capsule (50,000 Units total) by mouth every 7 (seven) days. Start taking on: February 11, 2023               Discharge Care Instructions  (From admission, onward)           Start     Ordered   02/04/23 0000  Discharge wound care:       Comments: Continue Portable wound vac x 1 week   02/04/23 1032            Follow-up Information     Candi Leash, MD .   Specialty: Family Medicine Contact information: [address] [place] [address] [phone]         [place], PA-C Follow up on 02/08/2023.   Specialty: Orthopedic Surgery Why: Office will call with date/time. Contact information: [address] [place] [address] [phone]         [facility] [place] Follow up.   Specialty: Infectious Diseases Why: Hospital follow up, Office will call with date/time, If you dont hear from them,please give them a call Contact information: [address] [place], [address] [place] [address] [phone]               Allergies  Allergen Reactions   Tylenol [Acetaminophen] Other (See Comments)    Impacts liver     Other Procedures/Studies: CT KNEE RIGHT WO CONTRAST  Result Date: 02/03/2023 CLINICAL DATA:  Osteomyelitis suspected. Septic arthritis. Post tibial ORIF for fractures 12/13/2022 with persistent serosanguineous drainage despite antibiotic therapy. EXAM: CT OF THE RIGHT KNEE WITHOUT CONTRAST TECHNIQUE: Multidetector CT imaging of the right knee was performed according to the standard protocol. Multiplanar CT image reconstructions were also generated. RADIATION DOSE REDUCTION: This exam was performed according to the departmental dose-optimization program which includes automated exposure control, adjustment of the mA and/or kV according to  patient size and/or use of iterative reconstruction technique. COMPARISON:  Radiographs 01/12/2023 and 12/27/2022. CT of the right knee 12/12/2022. FINDINGS: Bones/Joint/Cartilage Since the previous CT, the patient has undergone lateral plate and screw fixation of the proximal tibia. The hardware is intact without loosening or displacement. No definite osseous bridging of the previously demonstrated extensive fracture of the proximal tibia, involving both plateaus and undermining the tibial spines. There is slightly improved depression of the articular surface of the lateral tibial plateau compared with the prior CT. The distal femur, patella and proximal fibula appear intact. No evidence of osteomyelitis. Moderate-sized mildly complex knee joint effusion has decreased in volume compared with the original CT. Ligaments Suboptimally assessed by CT. Muscles and Tendons Intact extensor mechanism. No focal muscular abnormalities are identified. Soft tissues Possible small area of skin ulceration anterolaterally in the proximal lower leg. There is asymmetric subcutaneous edema and skin thickening in this  area, but no underlying focal fluid collection, soft tissue emphysema or unexpected foreign body. Milder generalized subcutaneous edema surrounding the knee. IMPRESSION: 1. Interval ORIF of the proximal tibia without evidence of hardware complication. No definite osseous bridging of the previously demonstrated extensive fracture of the proximal tibia. 2. No CT evidence of osteomyelitis. 3. Moderate-sized mildly complex knee joint effusion has decreased in volume compared with the original CT. No specific signs of septic arthritis. Consider arthrocentesis if that remains a clinical concern. 4. Possible small area of skin ulceration anterolaterally in the proximal lower leg with asymmetric subcutaneous edema and skin thickening in this area. No underlying focal fluid collection, soft tissue emphysema or unexpected foreign body. Electronically Signed   By: Carey Bullocks M.D.   On: 02/03/2023 15:25   DG Chest Port 1 View  Result Date: 01/20/2023 CLINICAL DATA:  PICC EXAM: PORTABLE CHEST 1 VIEW COMPARISON:  X-ray earlier 01/20/2023 and older. FINDINGS: Right-sided PICC with tip seen as far as the central SVC above the right atrium, advanced from previous. Film is rotated to the right. Enlarged cardiopericardial silhouette with interstitial changes and vascular prominence. No pneumothorax, effusion or consolidation. Density in the right upper abdomen at the edge of the imaging field. Please correlate for prior intervention or other process IMPRESSION: Interval advancement of the right-sided PICC with the tip now central SVC above the right atrium Electronically Signed   By: Karen Kays M.D.   On: 01/20/2023 19:29   [place] EKG Site Rite  Result Date: 01/20/2023 If Site Rite image not attached, placement could not be confirmed due to current cardiac rhythm.  DG Chest Port 1 View  Result Date: 01/20/2023 CLINICAL DATA:  Right PICC placement EXAM: PORTABLE CHEST 1 VIEW COMPARISON:  12/28/2022 FINDINGS: Two  frontal views of the chest as well as a frontal view centered over the right axilla was performed. Right-sided PICC is identified, tip overlying the confluence of the brachiocephalic veins. Cardiac silhouette remains enlarged. Continued central vascular congestion without evidence of airspace disease, effusion, or pneumothorax. No acute bony abnormalities. IMPRESSION: 1. Right upper extremity PICC, tip overlying the confluence of the brachiocephalic veins. Electronically Signed   By: Sharlet Salina M.D.   On: 01/20/2023 15:28   DG Knee Right Port  Result Date: 01/12/2023 CLINICAL DATA:  Right tibial ORIF.  Postoperative infection. EXAM: PORTABLE RIGHT KNEE - 1-2 VIEW COMPARISON:  12/27/2022, 12/12/2022 FINDINGS: Postsurgical changes from proximal tibial ORIF lateral sideplate and screw fixation construct. Stable alignment of comminuted lateral and medial tibial plateau fracture. Fracture lines remain visible. No new fracture  identified. Medial compartment joint space narrowing. No focal erosion. Large knee joint effusion, increased from prior. Diffuse soft tissue swelling. IMPRESSION: 1. Postsurgical changes from proximal tibial ORIF. Stable alignment of comminuted lateral and medial tibial plateau fracture. No erosive changes identified. 2. Large knee joint effusion, increased from prior. Appearance suspicious for septic arthritis. Correlate with joint fluid analysis. Electronically Signed   By: Duanne Guess D.O.   On: 01/12/2023 18:10   [place] EKG SITE RITE  Result Date: 01/10/2023 If Site Rite image not attached, placement could not be confirmed due to current cardiac rhythm.    TODAY-DAY OF DISCHARGE:  Subjective:   Jonathan Ibarra today has no headache,no chest abdominal pain,no new weakness tingling or numbness, feels much better wants to go home today.   Objective:   Blood pressure (!) 131/96, pulse 81, temperature 98.3 F (36.8 C), temperature source Oral, resp. rate 20, height 6' (1.829 m),  weight 117.9 kg, SpO2 94%. No intake or output data in the 24 hours ending 02/04/23 1118 Filed Weights   01/02/23 1247 01/12/23 0939  Weight: 123.6 kg 117.9 kg    Exam: Awake Alert, Oriented *3, No new F.N deficits, Normal affect [place].AT,PERRAL Supple Neck,No JVD, No cervical lymphadenopathy appriciated.  Symmetrical Chest wall movement, Good air movement bilaterally, CTAB RRR,No Gallops,Rubs or new Murmurs, No Parasternal Heave +ve B.Sounds, Abd Soft, Non tender, No organomegaly appriciated, No rebound -guarding or rigidity. No Cyanosis, Clubbing or edema, No new Rash or bruise   PERTINENT RADIOLOGIC STUDIES: CT KNEE RIGHT WO CONTRAST  Result Date: 02/03/2023 CLINICAL DATA:  Osteomyelitis suspected. Septic arthritis. Post tibial ORIF for fractures 12/13/2022 with persistent serosanguineous drainage despite antibiotic therapy. EXAM: CT OF THE RIGHT KNEE WITHOUT CONTRAST TECHNIQUE: Multidetector CT imaging of the right knee was performed according to the standard protocol. Multiplanar CT image reconstructions were also generated. RADIATION DOSE REDUCTION: This exam was performed according to the departmental dose-optimization program which includes automated exposure control, adjustment of the mA and/or kV according to patient size and/or use of iterative reconstruction technique. COMPARISON:  Radiographs 01/12/2023 and 12/27/2022. CT of the right knee 12/12/2022. FINDINGS: Bones/Joint/Cartilage Since the previous CT, the patient has undergone lateral plate and screw fixation of the proximal tibia. The hardware is intact without loosening or displacement. No definite osseous bridging of the previously demonstrated extensive fracture of the proximal tibia, involving both plateaus and undermining the tibial spines. There is slightly improved depression of the articular surface of the lateral tibial plateau compared with the prior CT. The distal femur, patella and proximal fibula appear intact. No  evidence of osteomyelitis. Moderate-sized mildly complex knee joint effusion has decreased in volume compared with the original CT. Ligaments Suboptimally assessed by CT. Muscles and Tendons Intact extensor mechanism. No focal muscular abnormalities are identified. Soft tissues Possible small area of skin ulceration anterolaterally in the proximal lower leg. There is asymmetric subcutaneous edema and skin thickening in this area, but no underlying focal fluid collection, soft tissue emphysema or unexpected foreign body. Milder generalized subcutaneous edema surrounding the knee. IMPRESSION: 1. Interval ORIF of the proximal tibia without evidence of hardware complication. No definite osseous bridging of the previously demonstrated extensive fracture of the proximal tibia. 2. No CT evidence of osteomyelitis. 3. Moderate-sized mildly complex knee joint effusion has decreased in volume compared with the original CT. No specific signs of septic arthritis. Consider arthrocentesis if that remains a clinical concern. 4. Possible small area of skin ulceration anterolaterally in the proximal lower leg with  asymmetric subcutaneous edema and skin thickening in this area. No underlying focal fluid collection, soft tissue emphysema or unexpected foreign body. Electronically Signed   By: Carey Bullocks M.D.   On: 02/03/2023 15:25     PERTINENT LAB RESULTS: CBC: Recent Labs    02/01/23 1212 02/04/23 0232  WBC 2.8* 2.6*  HGB 10.8* 10.4*  HCT 34.3* 33.2*  PLT 111* 100*   CMET CMP     Component Value Date/Time   NA 139 02/04/2023 0232   K 4.3 02/04/2023 0232   CL 106 02/04/2023 0232   CO2 27 02/04/2023 0232   GLUCOSE 79 02/04/2023 0232   BUN 17 02/04/2023 0232   CREATININE 0.64 02/04/2023 0232   CALCIUM 8.8 (L) 02/04/2023 0232   PROT 5.9 (L) 02/04/2023 0232   ALBUMIN 2.4 (L) 02/04/2023 0232   AST 40 02/04/2023 0232   ALT 37 02/04/2023 0232   ALKPHOS 131 (H) 02/04/2023 0232   BILITOT 1.0 02/04/2023 0232    GFRNONAA >60 02/04/2023 0232    GFR Estimated Creatinine Clearance: 136.6 mL/min (by C-G formula based on SCr of 0.64 mg/dL). No results for input(s): "LIPASE", "AMYLASE" in the last 72 hours. No results for input(s): "CKTOTAL", "CKMB", "CKMBINDEX", "TROPONINI" in the last 72 hours. Invalid input(s): "POCBNP" No results for input(s): "DDIMER" in the last 72 hours. No results for input(s): "HGBA1C" in the last 72 hours. No results for input(s): "CHOL", "HDL", "LDLCALC", "TRIG", "CHOLHDL", "LDLDIRECT" in the last 72 hours. No results for input(s): "TSH", "T4TOTAL", "T3FREE", "THYROIDAB" in the last 72 hours.  Invalid input(s): "FREET3" No results for input(s): "VITAMINB12", "FOLATE", "FERRITIN", "TIBC", "IRON", "RETICCTPCT" in the last 72 hours. Coags: No results for input(s): "INR" in the last 72 hours.  Invalid input(s): "PT" Microbiology: No results found for this or any previous visit (from the past 240 hour(s)).  FURTHER DISCHARGE INSTRUCTIONS:  Get Medicines reviewed and adjusted: Please take all your medications with you for your next visit with your Primary MD  Laboratory/radiological data: Please request your Primary MD to go over all hospital tests and procedure/radiological results at the follow up, please ask your Primary MD to get all Hospital records sent to his/her office.  In some cases, they will be blood work, cultures and biopsy results pending at the time of your discharge. Please request that your primary care M.D. goes through all the records of your hospital data and follows up on these results.  Also Note the following: If you experience worsening of your admission symptoms, develop shortness of breath, life threatening emergency, suicidal or homicidal thoughts you must seek medical attention immediately by calling 911 or calling your MD immediately  if symptoms less severe.  You must read complete instructions/literature along with all the possible adverse  reactions/side effects for all the Medicines you take and that have been prescribed to you. Take any new Medicines after you have completely understood and accpet all the possible adverse reactions/side effects.   Do not drive when taking Pain medications or sleeping medications (Benzodaizepines)  Do not take more than prescribed Pain, Sleep and Anxiety Medications. It is not advisable to combine anxiety,sleep and pain medications without talking with your primary care practitioner  Special Instructions: If you have smoked or chewed Tobacco  in the last 2 yrs please stop smoking, stop any regular Alcohol  and or any Recreational drug use.  Wear Seat belts while driving.  Please note: You were cared for by a hospitalist during your hospital stay. Once you  are discharged, your primary care physician will handle any further medical issues. Please note that NO REFILLS for any discharge medications will be authorized once you are discharged, as it is imperative that you return to your primary care physician (or establish a relationship with a primary care physician if you do not have one) for your post hospital discharge needs so that they can reassess your need for medications and monitor your lab values.  Total Time spent coordinating discharge including counseling, education and face to face time equals greater than 30 minutes.  Signed: Jahnessa Ibarra 02/04/2023 11:18 AM

## 2023-02-04 NOTE — Progress Notes (Addendum)
CVAD removed per protocol per MD order. Manual pressure applied for 3 mins. Vaseline gauze, gauze, and Tegaderm applied over insertion site. No bleeding or swelling noted. Instructed patient to remain in bed for thirty mins. Educated patient about S/S of infection and when to call MD; no heavy lifting or pressure on R side for 24 hours; keep dressing dry and intact for 24 hours. Pt demonstrated comprehension.

## 2023-02-04 NOTE — Progress Notes (Signed)
Orthopaedic Trauma Progress Note  SUBJECTIVE: Doing ok this morning.  Continues to have serosanguinous drainage from incision.  OBJECTIVE:  Vitals:   02/04/23 0000 02/04/23 0810  BP: 112/81 (!) 131/96  Pulse: 73 81  Resp: 18 20  Temp: 98.2 F (36.8 C) 98.3 F (36.8 C)  SpO2: 95% 94%    General: Resting in bed, no acute distress Respiratory: No increased work of breathing.  Right lower extremity:  Incision as below. Proximal incision healed. Distal incision with continued serosanguinous drainage.  Unable to express any purulent fluid.  Placed incisional wound VAC.  Swelling remains stable.  Tolerates gentle knee ROM. Ankle dorsiflexion/plantarflexion is intact.  Soreness to the calf but no areas of significant pain.  +EHL/FHL intact.  Neurovascularly intact.    IMAGING: CT scan right knee performed 02/03/2023 shows stable appearance to fixation.  No drainable fluid collections.  Effusion decreasing in size.  No erosive bony changes noted  LABS:  Results for orders placed or performed during the hospital encounter of 12/27/22 (from the past 24 hour(s))  CBC     Status: Abnormal   Collection Time: 02/04/23  2:32 AM  Result Value Ref Range   WBC 2.6 (L) 4.0 - 10.5 K/uL   RBC 3.57 (L) 4.22 - 5.81 MIL/uL   Hemoglobin 10.4 (L) 13.0 - 17.0 g/dL   HCT 09.8 (L) 11.9 - 14.7 %   MCV 93.0 80.0 - 100.0 fL   MCH 29.1 26.0 - 34.0 pg   MCHC 31.3 30.0 - 36.0 g/dL   RDW 82.9 56.2 - 13.0 %   Platelets 100 (L) 150 - 400 K/uL   nRBC 0.0 0.0 - 0.2 %  Comprehensive metabolic panel     Status: Abnormal   Collection Time: 02/04/23  2:32 AM  Result Value Ref Range   Sodium 139 135 - 145 mmol/L   Potassium 4.3 3.5 - 5.1 mmol/L   Chloride 106 98 - 111 mmol/L   CO2 27 22 - 32 mmol/L   Glucose, Bld 79 70 - 99 mg/dL   BUN 17 6 - 20 mg/dL   Creatinine, Ser 8.65 0.61 - 1.24 mg/dL   Calcium 8.8 (L) 8.9 - 10.3 mg/dL   Total Protein 5.9 (L) 6.5 - 8.1 g/dL   Albumin 2.4 (L) 3.5 - 5.0 g/dL   AST 40 15 -  41 U/L   ALT 37 0 - 44 U/L   Alkaline Phosphatase 131 (H) 38 - 126 U/L   Total Bilirubin 1.0 <1.2 mg/dL   GFR, Estimated >78 >46 mL/min   Anion gap 6 5 - 15       ASSESSMENT: Jonathan Ibarra is a 57 y.o. male s/p IRRIGATION AND DEBRIDEMENT RIGHT KNEE on 01/12/2023 and 12/29/22  Previous ORIF of right tibial plateau fracture with repair of right lateral meniscus tear on 12/13/2022  PLAN: Weightbearing: TDWB RLE ROM: Okay for range of motion of the knee as tolerated Incisional and dressing care: Maintain incisional wound VAC  Showering: Ok to shower  Orthopedic device(s): None Pain management: continue current regimen VTE prophylaxis: Lovenox.  SCDs ID: Per infectious disease Foley/Lines:  No foley, KVO IVFs Impediments to Fracture Healing:  Infection.  Vitamin D level 28, continue supplementation Dispo: Continue therapies as tolerated.  Placed portable incisional wound VAC over right knee incision this a.m.  I will plan to maintain this for 5 to 7 days.  No further surgical intervention indicated at this time.  Patient may discharge with wound VAC in place.  Okay for discharge from ortho standpoint  Follow - up plan: 02/08/2023 at 9:15 AM for wound check and wound VAC removal   Contact information:  Truitt Merle MD, Thyra Breed PA-C. After hours and holidays please check [email or web address] for group call information for [facility]   Thompson Caul, PA-C [phone] (office) [email or web address]

## 2023-02-04 NOTE — Progress Notes (Signed)
PICC removal: arrived to room, RN requested we hold off on PICC removal. She will enter consult when ready for line to be removed.

## 2023-02-04 NOTE — Progress Notes (Signed)
PT Cancellation Note  Patient Details Name: Jonathan Ibarra MRN: [id number] DOB: [birth year]   Cancelled Treatment:    Reason Eval/Treat Not Completed: (P) Patient declined, no reason specified (Pt declines stair trial or mobility due to focus on planned discharge. Will continue to follow per PT POC.)   [name] 02/04/2023, 1:26 PM

## 2023-02-04 NOTE — TOC Transition Note (Signed)
Transition of Care [facility]) - CM/SW Discharge Note   Patient Details  Name: Jonathan Ibarra MRN: [id number] Date of Birth: [birth year]  Transition of Care [employer] [facility]) CM/SW Contact:  Lockie Pares, RN Phone Number: 02/04/2023, 12:57 PM   Clinical Narrative:     Patient is to go home to ex Girlfriend [name] house. He has a proveena, and is expected in the office in one week for removal. He is receiving oral antibiotics from pharmacy. Discussion with pharmacy, provider and nursing regarding disposition. He can not currently get a hold of [name] to gain entrance to house. He does have Home health via [facility] set up.   Final next level of care: Home w Home Health Services Barriers to Discharge: No Barriers Identified   Patient Goals and CMS Choice      Discharge Placement    Home with home health                     Discharge Plan and Services Additional resources added to the After Visit Summary for                            HH Arranged: PT, OT HH Agency: [facility] Date [facility] Agency Contacted: 02/04/23 Time HH Agency Contacted: 1252 Representative spoke with at [facility] Agency: Amy  Social Determinants of Health (SDOH) Interventions SDOH Screenings   Food Insecurity: Food Insecurity Present (12/28/2022)  Housing: High Risk (12/28/2022)  Transportation Needs: Unmet Transportation Needs (12/28/2022)  Utilities: Not At Risk (12/28/2022)  Financial Resource Strain: Medium Risk (01/07/2020)   Received from [facility], [facility]  Social Connections: Unknown (07/05/2021)   Received from [facility], [facility]  Stress: No Stress Concern Present (01/07/2020)   Received from [facility], [facility]  Tobacco Use: High Risk (01/12/2023)     Readmission Risk Interventions     No data to display

## 2023-02-04 NOTE — Progress Notes (Signed)
    [facility]   Reason for visit: Follow up on right knee infection  Interval History: CT scan without new concerns and no indication for further orthopedic intervention at this time.  Afebrile.     Physical Exam: Constitutional:  Vitals:   02/04/23 0000 02/04/23 0810  BP: 112/81 (!) 131/96  Pulse: 73 81  Resp: 18 20  Temp: 98.2 F (36.8 C) 98.3 F (36.8 C)  SpO2: 95% 94%   patient appears in NAD Respiratory: Normal respiratory effort MS: vac over knee wound  Review of Systems: Constitutional: negative for fevers and chills  Lab Results  Component Value Date   WBC 2.6 (L) 02/04/2023   HGB 10.4 (L) 02/04/2023   HCT 33.2 (L) 02/04/2023   MCV 93.0 02/04/2023   PLT 100 (L) 02/04/2023    Lab Results  Component Value Date   CREATININE 0.64 02/04/2023   BUN 17 02/04/2023   NA 139 02/04/2023   K 4.3 02/04/2023   CL 106 02/04/2023   CO2 27 02/04/2023    Lab Results  Component Value Date   ALT 37 02/04/2023   AST 40 02/04/2023   ALKPHOS 131 (H) 02/04/2023     Microbiology: No results found for this or any previous visit (from the past 240 hour(s)).  Impression/Plan:  1. Right knee infection with associated hardware - has completed IV antibiotics and with hardware noted near infection, need for prolonged treatment.  Now has omadacycline and will go out on this.  Has follow up arranged with Dr. Thedore Mins 12/17 to look to continue with this.  No clear duration   2.  Polysubstance abuse - has remained hospitalized to remain on IV antibiotics, now appropratie to transition to oral antibiotics as above.    3.  Cirrhosis - he is on lactulose related to this.   No current encephalopathy.

## 2023-02-04 NOTE — Progress Notes (Signed)
Patient discharge education completed, patient verbalized understanding of education given. Medication given to patient including oxycodone 10mg  tabs.

## 2023-02-04 NOTE — Plan of Care (Signed)
  Problem: Education: Goal: Knowledge of disease or condition will improve 02/04/2023 0351 by Milus Banister, RN Outcome: Progressing 02/04/2023 0351 by Milus Banister, RN Outcome: Progressing Goal: Understanding of medication regimen will improve 02/04/2023 0351 by Milus Banister, RN Outcome: Progressing 02/04/2023 0351 by Milus Banister, RN Outcome: Progressing Goal: Individualized Educational Video(s) 02/04/2023 0351 by Milus Banister, RN Outcome: Progressing 02/04/2023 0351 by Milus Banister, RN Outcome: Progressing   Problem: Health Behavior/Discharge Planning: Goal: Ability to safely manage health-related needs after discharge will improve 02/04/2023 0351 by Milus Banister, RN Outcome: Progressing 02/04/2023 0351 by Milus Banister, RN Outcome: Progressing   Problem: Education: Goal: Knowledge of General Education information will improve Description: Including pain rating scale, medication(s)/side effects and non-pharmacologic comfort measures 02/04/2023 0351 by Milus Banister, RN Outcome: Progressing 02/04/2023 0351 by Milus Banister, RN Outcome: Progressing   Problem: Health Behavior/Discharge Planning: Goal: Ability to manage health-related needs will improve 02/04/2023 0351 by Milus Banister, RN Outcome: Progressing 02/04/2023 0351 by Milus Banister, RN Outcome: Progressing   Problem: Clinical Measurements: Goal: Ability to maintain clinical measurements within normal limits will improve 02/04/2023 0351 by Milus Banister, RN Outcome: Progressing 02/04/2023 0351 by Milus Banister, RN Outcome: Progressing Goal: Diagnostic test results will improve 02/04/2023 0351 by Milus Banister, RN Outcome: Progressing 02/04/2023 0351 by Milus Banister, RN Outcome: Progressing Goal: Respiratory complications will improve 02/04/2023 0351 by Milus Banister, RN Outcome: Progressing 02/04/2023 0351 by Milus Banister, RN Outcome: Progressing   Problem: Coping: Goal: Level of anxiety will  decrease 02/04/2023 0351 by Milus Banister, RN Outcome: Progressing 02/04/2023 0351 by Milus Banister, RN Outcome: Progressing   Problem: Elimination: Goal: Will not experience complications related to bowel motility 02/04/2023 0351 by Milus Banister, RN Outcome: Progressing 02/04/2023 0351 by Milus Banister, RN Outcome: Progressing   Problem: Pain Management: Goal: General experience of comfort will improve 02/04/2023 0351 by Milus Banister, RN Outcome: Progressing 02/04/2023 0351 by Milus Banister, RN Outcome: Progressing   Problem: Safety: Goal: Ability to remain free from injury will improve 02/04/2023 0351 by Milus Banister, RN Outcome: Progressing 02/04/2023 0351 by Milus Banister, RN Outcome: Progressing   Problem: Skin Integrity: Goal: Risk for impaired skin integrity will decrease 02/04/2023 0351 by Milus Banister, RN Outcome: Progressing 02/04/2023 0351 by Milus Banister, RN Outcome: Progressing

## 2023-02-09 ENCOUNTER — Telehealth: Payer: Self-pay

## 2023-02-09 NOTE — Telephone Encounter (Signed)
Patient called stating he has a machine hooked up to his knee that's almost full and home health has not been out to assist. From our discussion, it sounds like he has a wound vac. Provided him contact info for [facility] to call and request nursing assistance.   Sandie Ano, RN

## 2023-02-15 ENCOUNTER — Ambulatory Visit: Payer: Medicaid Other | Admitting: Internal Medicine

## 2023-02-16 ENCOUNTER — Other Ambulatory Visit (HOSPITAL_COMMUNITY): Payer: Self-pay

## 2023-02-16 MED ORDER — OXYCODONE HCL 10 MG PO TABS
5.0000 mg | ORAL_TABLET | Freq: Four times a day (QID) | ORAL | 0 refills | Status: DC | PRN
  Filled 2023-02-16: qty 20, 5d supply, fill #0

## 2023-02-21 ENCOUNTER — Other Ambulatory Visit (HOSPITAL_COMMUNITY): Payer: Self-pay

## 2023-02-22 ENCOUNTER — Other Ambulatory Visit (HOSPITAL_COMMUNITY): Payer: Self-pay

## 2023-03-08 ENCOUNTER — Other Ambulatory Visit (HOSPITAL_COMMUNITY): Payer: Self-pay

## 2023-03-08 MED ORDER — OXYCODONE HCL 10 MG PO TABS
5.0000 mg | ORAL_TABLET | Freq: Four times a day (QID) | ORAL | 0 refills | Status: DC | PRN
  Filled 2023-03-08: qty 20, 5d supply, fill #0

## 2023-03-09 ENCOUNTER — Other Ambulatory Visit (HOSPITAL_COMMUNITY): Payer: Self-pay

## 2023-03-11 ENCOUNTER — Other Ambulatory Visit (HOSPITAL_COMMUNITY): Payer: Self-pay

## 2023-04-16 ENCOUNTER — Inpatient Hospital Stay (HOSPITAL_COMMUNITY)
Admission: EM | Admit: 2023-04-16 | Discharge: 2023-05-03 | DRG: 486 | Disposition: A | Discharge status: Home / Self Care | Payer: Medicaid Other | Attending: Internal Medicine | Admitting: Internal Medicine

## 2023-04-16 ENCOUNTER — Emergency Department (HOSPITAL_COMMUNITY): Payer: Medicaid Other

## 2023-04-16 ENCOUNTER — Other Ambulatory Visit: Payer: Self-pay

## 2023-04-16 DIAGNOSIS — B9562 Methicillin resistant Staphylococcus aureus infection as the cause of diseases classified elsewhere: Secondary | ICD-10-CM | POA: Diagnosis not present

## 2023-04-16 DIAGNOSIS — Z789 Other specified health status: Secondary | ICD-10-CM

## 2023-04-16 DIAGNOSIS — K7030 Alcoholic cirrhosis of liver without ascites: Secondary | ICD-10-CM | POA: Diagnosis not present

## 2023-04-16 DIAGNOSIS — Y792 Prosthetic and other implants, materials and accessory orthopedic devices associated with adverse incidents: Secondary | ICD-10-CM | POA: Diagnosis present

## 2023-04-16 DIAGNOSIS — F17290 Nicotine dependence, other tobacco product, uncomplicated: Secondary | ICD-10-CM | POA: Diagnosis present

## 2023-04-16 DIAGNOSIS — Z96651 Presence of right artificial knee joint: Secondary | ICD-10-CM | POA: Diagnosis present

## 2023-04-16 DIAGNOSIS — K7460 Unspecified cirrhosis of liver: Secondary | ICD-10-CM | POA: Diagnosis present

## 2023-04-16 DIAGNOSIS — I340 Nonrheumatic mitral (valve) insufficiency: Secondary | ICD-10-CM | POA: Diagnosis not present

## 2023-04-16 DIAGNOSIS — I8510 Secondary esophageal varices without bleeding: Secondary | ICD-10-CM | POA: Diagnosis not present

## 2023-04-16 DIAGNOSIS — Z886 Allergy status to analgesic agent status: Secondary | ICD-10-CM | POA: Diagnosis not present

## 2023-04-16 DIAGNOSIS — I10 Essential (primary) hypertension: Secondary | ICD-10-CM | POA: Diagnosis present

## 2023-04-16 DIAGNOSIS — R400 Somnolence: Secondary | ICD-10-CM | POA: Diagnosis not present

## 2023-04-16 DIAGNOSIS — I4819 Other persistent atrial fibrillation: Secondary | ICD-10-CM | POA: Diagnosis not present

## 2023-04-16 DIAGNOSIS — D62 Acute posthemorrhagic anemia: Secondary | ICD-10-CM | POA: Diagnosis not present

## 2023-04-16 DIAGNOSIS — I119 Hypertensive heart disease without heart failure: Secondary | ICD-10-CM | POA: Diagnosis not present

## 2023-04-16 DIAGNOSIS — I480 Paroxysmal atrial fibrillation: Secondary | ICD-10-CM | POA: Diagnosis present

## 2023-04-16 DIAGNOSIS — T8453XA Infection and inflammatory reaction due to internal right knee prosthesis, initial encounter: Secondary | ICD-10-CM | POA: Diagnosis not present

## 2023-04-16 DIAGNOSIS — Z9181 History of falling: Secondary | ICD-10-CM

## 2023-04-16 DIAGNOSIS — I361 Nonrheumatic tricuspid (valve) insufficiency: Secondary | ICD-10-CM | POA: Diagnosis not present

## 2023-04-16 DIAGNOSIS — Z6835 Body mass index (BMI) 35.0-35.9, adult: Secondary | ICD-10-CM

## 2023-04-16 DIAGNOSIS — Z8619 Personal history of other infectious and parasitic diseases: Secondary | ICD-10-CM

## 2023-04-16 DIAGNOSIS — T84622A Infection and inflammatory reaction due to internal fixation device of right tibia, initial encounter: Secondary | ICD-10-CM | POA: Diagnosis not present

## 2023-04-16 DIAGNOSIS — M25461 Effusion, right knee: Principal | ICD-10-CM

## 2023-04-16 DIAGNOSIS — F17210 Nicotine dependence, cigarettes, uncomplicated: Secondary | ICD-10-CM | POA: Diagnosis not present

## 2023-04-16 DIAGNOSIS — F1910 Other psychoactive substance abuse, uncomplicated: Secondary | ICD-10-CM | POA: Diagnosis present

## 2023-04-16 DIAGNOSIS — F1010 Alcohol abuse, uncomplicated: Secondary | ICD-10-CM | POA: Diagnosis not present

## 2023-04-16 DIAGNOSIS — M00061 Staphylococcal arthritis, right knee: Secondary | ICD-10-CM | POA: Diagnosis present

## 2023-04-16 DIAGNOSIS — Z79899 Other long term (current) drug therapy: Secondary | ICD-10-CM

## 2023-04-16 DIAGNOSIS — I482 Chronic atrial fibrillation, unspecified: Secondary | ICD-10-CM | POA: Insufficient documentation

## 2023-04-16 DIAGNOSIS — R946 Abnormal results of thyroid function studies: Secondary | ICD-10-CM | POA: Diagnosis present

## 2023-04-16 DIAGNOSIS — Z5982 Transportation insecurity: Secondary | ICD-10-CM

## 2023-04-16 DIAGNOSIS — Z5941 Food insecurity: Secondary | ICD-10-CM

## 2023-04-16 DIAGNOSIS — Z95828 Presence of other vascular implants and grafts: Secondary | ICD-10-CM

## 2023-04-16 DIAGNOSIS — M009 Pyogenic arthritis, unspecified: Secondary | ICD-10-CM | POA: Diagnosis not present

## 2023-04-16 DIAGNOSIS — Z9049 Acquired absence of other specified parts of digestive tract: Secondary | ICD-10-CM

## 2023-04-16 DIAGNOSIS — Z91199 Patient's noncompliance with other medical treatment and regimen due to unspecified reason: Secondary | ICD-10-CM

## 2023-04-16 DIAGNOSIS — R7881 Bacteremia: Secondary | ICD-10-CM | POA: Diagnosis present

## 2023-04-16 DIAGNOSIS — D6959 Other secondary thrombocytopenia: Secondary | ICD-10-CM | POA: Diagnosis not present

## 2023-04-16 DIAGNOSIS — L02415 Cutaneous abscess of right lower limb: Secondary | ICD-10-CM | POA: Diagnosis present

## 2023-04-16 DIAGNOSIS — B1920 Unspecified viral hepatitis C without hepatic coma: Secondary | ICD-10-CM | POA: Diagnosis present

## 2023-04-16 DIAGNOSIS — E669 Obesity, unspecified: Secondary | ICD-10-CM | POA: Diagnosis not present

## 2023-04-16 DIAGNOSIS — F1290 Cannabis use, unspecified, uncomplicated: Secondary | ICD-10-CM | POA: Diagnosis present

## 2023-04-16 DIAGNOSIS — D696 Thrombocytopenia, unspecified: Secondary | ICD-10-CM | POA: Diagnosis present

## 2023-04-16 DIAGNOSIS — I4891 Unspecified atrial fibrillation: Secondary | ICD-10-CM | POA: Diagnosis not present

## 2023-04-16 LAB — I-STAT CG4 LACTIC ACID, ED: Lactic Acid, Venous: 2.1 mmol/L (ref 0.5–1.9)

## 2023-04-16 LAB — GLUCOSE, CAPILLARY: Glucose-Capillary: 101 mg/dL — ABNORMAL HIGH (ref 70–99)

## 2023-04-16 LAB — CBC WITH DIFFERENTIAL/PLATELET
Abs Immature Granulocytes: 0.09 10*3/uL — ABNORMAL HIGH (ref 0.00–0.07)
Basophils Absolute: 0.1 10*3/uL (ref 0.0–0.1)
Basophils Relative: 1 %
Eosinophils Absolute: 0 10*3/uL (ref 0.0–0.5)
Eosinophils Relative: 0 %
HCT: 42.6 % (ref 39.0–52.0)
Hemoglobin: 14 g/dL (ref 13.0–17.0)
Immature Granulocytes: 1 %
Lymphocytes Relative: 4 %
Lymphs Abs: 0.5 10*3/uL — ABNORMAL LOW (ref 0.7–4.0)
MCH: 29.5 pg (ref 26.0–34.0)
MCHC: 32.9 g/dL (ref 30.0–36.0)
MCV: 89.9 fL (ref 80.0–100.0)
Monocytes Absolute: 1.5 10*3/uL — ABNORMAL HIGH (ref 0.1–1.0)
Monocytes Relative: 14 %
Neutro Abs: 8.8 10*3/uL — ABNORMAL HIGH (ref 1.7–7.7)
Neutrophils Relative %: 80 %
Platelets: 124 10*3/uL — ABNORMAL LOW (ref 150–400)
RBC: 4.74 MIL/uL (ref 4.22–5.81)
RDW: 15.1 % (ref 11.5–15.5)
WBC: 10.9 10*3/uL — ABNORMAL HIGH (ref 4.0–10.5)
nRBC: 0 % (ref 0.0–0.2)

## 2023-04-16 LAB — BASIC METABOLIC PANEL
Anion gap: 11 (ref 5–15)
BUN: 11 mg/dL (ref 6–20)
CO2: 23 mmol/L (ref 22–32)
Calcium: 9.1 mg/dL (ref 8.9–10.3)
Chloride: 101 mmol/L (ref 98–111)
Creatinine, Ser: 0.9 mg/dL (ref 0.61–1.24)
GFR, Estimated: >60 mL/min (ref >60)
Glucose, Bld: 95 mg/dL (ref 70–99)
Potassium: 3.6 mmol/L (ref 3.5–5.1)
Sodium: 135 mmol/L (ref 135–145)

## 2023-04-16 LAB — C-REACTIVE PROTEIN: CRP: 17.9 mg/dL — ABNORMAL HIGH (ref <1.0)

## 2023-04-16 LAB — ETHANOL: Alcohol, Ethyl (B): <10 mg/dL (ref <10)

## 2023-04-16 LAB — AMMONIA: Ammonia: 49 umol/L — ABNORMAL HIGH (ref 9–35)

## 2023-04-16 LAB — SEDIMENTATION RATE: Sed Rate: 2 mm/h (ref 0–16)

## 2023-04-16 MED ORDER — HYDRALAZINE HCL 20 MG/ML IJ SOLN: 10.0000 mg | Freq: Three times a day (TID) | INTRAMUSCULAR | Status: DC | PRN

## 2023-04-16 MED ORDER — FOLIC ACID 1 MG PO TABS
1.0000 mg | ORAL_TABLET | Freq: Every day | ORAL | Status: DC
  Administered 2023-04-18 – 2023-05-03 (×16): 1 mg via ORAL
  Filled 2023-04-16 (×16): qty 1

## 2023-04-16 MED ORDER — HYDROMORPHONE HCL 1 MG/ML IJ SOLN
0.5000 mg | INTRAMUSCULAR | Status: DC | PRN
  Administered 2023-04-16 (×2): 1 mg via INTRAVENOUS
  Filled 2023-04-16 (×2): qty 1

## 2023-04-16 MED ORDER — METOPROLOL TARTRATE 25 MG PO TABS
25.0000 mg | ORAL_TABLET | Freq: Two times a day (BID) | ORAL | Status: AC
  Administered 2023-04-16 – 2023-04-22 (×13): 25 mg via ORAL
  Filled 2023-04-16 (×13): qty 1

## 2023-04-16 MED ORDER — SODIUM CHLORIDE 0.9 % IV BOLUS: 1000.0000 mL | Freq: Once | INTRAVENOUS | Status: DC

## 2023-04-16 MED ORDER — DILTIAZEM HCL-DEXTROSE 125-5 MG/125ML-% IV SOLN (PREMIX)
5.0000 mg/h | INTRAVENOUS | Status: AC
  Administered 2023-04-17: 10 mg/h via INTRAVENOUS
  Administered 2023-04-17: 5 mg/h via INTRAVENOUS
  Administered 2023-04-18: 7.5 mg/h via INTRAVENOUS
  Filled 2023-04-16 (×3): qty 125

## 2023-04-16 MED ORDER — METHOCARBAMOL 750 MG PO TABS
750.0000 mg | ORAL_TABLET | Freq: Three times a day (TID) | ORAL | Status: DC | PRN
  Administered 2023-04-16 – 2023-04-30 (×12): 750 mg via ORAL
  Filled 2023-04-16 (×2): qty 2
  Filled 2023-04-16 (×5): qty 1
  Filled 2023-04-16: qty 2
  Filled 2023-04-16 (×5): qty 1

## 2023-04-16 MED ORDER — DOCUSATE SODIUM 100 MG PO CAPS
100.0000 mg | ORAL_CAPSULE | Freq: Two times a day (BID) | ORAL | Status: DC
  Administered 2023-04-16 – 2023-04-19 (×5): 100 mg via ORAL
  Filled 2023-04-16 (×6): qty 1

## 2023-04-16 MED ORDER — SENNA 8.6 MG PO TABS
1.0000 | ORAL_TABLET | Freq: Two times a day (BID) | ORAL | Status: DC
  Administered 2023-04-16 – 2023-05-03 (×22): 8.6 mg via ORAL
  Filled 2023-04-16 (×30): qty 1

## 2023-04-16 MED ORDER — SODIUM CHLORIDE 0.9 % IV BOLUS
500.0000 mL | Freq: Once | INTRAVENOUS | Status: AC
  Administered 2023-04-16: 500 mL via INTRAVENOUS

## 2023-04-16 MED ORDER — CEFAZOLIN SODIUM-DEXTROSE 1-4 GM/50ML-% IV SOLN
1.0000 g | Freq: Once | INTRAVENOUS | Status: AC
  Administered 2023-04-16: 1 g via INTRAVENOUS
  Filled 2023-04-16: qty 50

## 2023-04-16 MED ORDER — PIPERACILLIN-TAZOBACTAM 3.375 G IVPB 30 MIN
3.3750 g | Freq: Once | INTRAVENOUS | Status: AC
  Administered 2023-04-16: 3.375 g via INTRAVENOUS
  Filled 2023-04-16: qty 50

## 2023-04-16 MED ORDER — SODIUM CHLORIDE 0.9 % IV SOLN: INTRAVENOUS | Status: AC

## 2023-04-16 MED ORDER — MORPHINE SULFATE (PF) 4 MG/ML IV SOLN
4.0000 mg | Freq: Once | INTRAVENOUS | Status: AC
  Administered 2023-04-16: 4 mg via INTRAVENOUS
  Filled 2023-04-16: qty 1

## 2023-04-16 MED ORDER — VANCOMYCIN HCL 1250 MG/250ML IV SOLN
1250.0000 mg | Freq: Two times a day (BID) | INTRAVENOUS | Status: DC
  Administered 2023-04-17: 1250 mg via INTRAVENOUS
  Filled 2023-04-16 (×2): qty 250

## 2023-04-16 MED ORDER — PIPERACILLIN-TAZOBACTAM 3.375 G IVPB: 3.3750 g | Freq: Three times a day (TID) | INTRAVENOUS | Status: DC

## 2023-04-16 MED ORDER — ONDANSETRON HCL 4 MG/2ML IJ SOLN
4.0000 mg | Freq: Once | INTRAMUSCULAR | Status: AC
  Administered 2023-04-16: 4 mg via INTRAVENOUS
  Filled 2023-04-16: qty 2

## 2023-04-16 MED ORDER — LACTULOSE 10 GM/15ML PO SOLN
20.0000 g | Freq: Three times a day (TID) | ORAL | Status: DC
  Administered 2023-04-17 – 2023-04-21 (×10): 20 g via ORAL
  Filled 2023-04-16 (×13): qty 30

## 2023-04-16 MED ORDER — VANCOMYCIN HCL 1250 MG/250ML IV SOLN
1250.0000 mg | Freq: Two times a day (BID) | INTRAVENOUS | Status: DC
  Filled 2023-04-16: qty 250

## 2023-04-16 MED ORDER — THIAMINE MONONITRATE 100 MG PO TABS
100.0000 mg | ORAL_TABLET | Freq: Every day | ORAL | Status: DC
  Administered 2023-04-18 – 2023-05-03 (×16): 100 mg via ORAL
  Filled 2023-04-16 (×20): qty 1

## 2023-04-16 MED ORDER — VANCOMYCIN HCL 2000 MG/400ML IV SOLN
2000.0000 mg | Freq: Once | INTRAVENOUS | Status: AC
  Administered 2023-04-16: 2000 mg via INTRAVENOUS
  Filled 2023-04-16 (×2): qty 400

## 2023-04-16 MED ORDER — ENOXAPARIN SODIUM 40 MG/0.4ML IJ SOSY: 40.0000 mg | PREFILLED_SYRINGE | INTRAMUSCULAR | Status: DC

## 2023-04-16 MED ORDER — ONDANSETRON HCL 4 MG/2ML IJ SOLN: 4.0000 mg | Freq: Four times a day (QID) | INTRAMUSCULAR | Status: DC | PRN

## 2023-04-16 MED ORDER — ONDANSETRON HCL 4 MG PO TABS: 4.0000 mg | ORAL_TABLET | Freq: Four times a day (QID) | ORAL | Status: DC | PRN

## 2023-04-16 MED ORDER — METOPROLOL TARTRATE 5 MG/5ML IV SOLN
5.0000 mg | Freq: Once | INTRAVENOUS | Status: AC
  Administered 2023-04-16: 5 mg via INTRAVENOUS
  Filled 2023-04-16: qty 5

## 2023-04-16 NOTE — Progress Notes (Signed)
Ortho Note  Received call regarding this patient. He has history of postoperative infection following ORIF of tibial plateau with I&Dx2. He has been noncompliant with follow up and has multiple no shows to our office as an outpatient. Presents with questionable erythema and "foul smelling" drainage. I have reviewed x-rays and clinical photos which appear benign compared to previous appearance of wound. If concern regarding infection would recommend admission to hospitalist for antibiotics due to chronic medical comorbidities. We will consult tomorrow AM.   Roby Lofts, MD [facility] [phone] (office) [email or web address]

## 2023-04-16 NOTE — ED Provider Notes (Signed)
Assume Care Medical Decision Making  Patient care of assumed from previous provider at change of shift. Please see the original provider note from this emergency department encounter for full history and physical.   Briefly, Jonathan Ibarra is a 58 y.o. male who presents with:  Clinical Course as of 04/16/23 1555  Sat Apr 16, 2023  1515 Chronic cirrhosis, TIPs procedure, h/f concern for septic join ( Status post ORIF of the proximal tibia for a tibial plateau). Has had multiple washout procedures, now having drainage from prior washout procedure [RK]  1526 Spoke with Dr Jena Gauss who recommends IV Antibiotics and admit to hospital team. They agree to consult.  [JB]  1553 Start antibiotics per ortho [RK]    Clinical Course User Index [JB] Barrett, Horald Chestnut, PA-C [RK] Caron Presume, MD     Please refer to the original provider's note for additional information regarding the care of Jonathan Ibarra.  Labs reviewed by myself and considered in medical decision making.  Imaging reviewed by myself and considered in medical decision making. Imaging final read interpreted by radiology.  Additional MDM/ED Course: Briefly this is a 58 year old patient who is status post ORIF of a proximal right tibial plateau fracture.   He fell onto knee two days ago and now patient unable to ambulate. Has been through 2 prior washouts, concern for septic joint in the past. Now having purulent drainage from prior incisional sites.  His lactic acid is 2.1 and white blood count is 10.9. Afebrile here.  Knee x-ray shows concern for large effusion. Given concern for septic joint, orthopedic surgery was consulted, they recommend admission for washout. Ortho recommended starting antibiotics and so I start patient on Ancef here in the emergency department.  I spoke to the hospitalist team who agreed to admit patient.  Disposition:  I discussed the case with hospitalist team who graciously agreed to admit  the patient to their service for continued care.   Clinical Impression:  1. Effusion of right knee joint     Rx / DC Orders ED Discharge Orders     None       The plan for this patient was discussed with Dr. Jearld Fenton, who voiced agreement and who oversaw evaluation and treatment of this patient.    Caron Presume, MD 04/16/23 1555    Loetta Rough, MD 04/16/23 [phone]

## 2023-04-16 NOTE — H&P (Addendum)
History and Physical    Jonathan Ibarra ZOX:[id number] DOB: November 04, 1965 DOA: 04/16/2023  PCP: Candi Leash, MD   Patient coming from: Home  I have personally briefly reviewed patient's old medical records in [facility]  Chief Complaint: Worsening swelling and pain in Right Knee after fall 2 days ago.  HPI: Jonathan Ibarra is 58 y.o. male with PMH significant for Liver cirrhosis, s/p TIPS procedure, Essential hypertension, polysubstance abuse, Atrial Fibrillation , recent right plateau fracture requiring ORIF on 12/13/22.  Patient was hospitalized from 10/ 28/24  -02/04/2023 with purulent discharge from the right knee, thought to have post operative septic arthritis.  Patient was managed with IV ertapenem.  Patient was evaluated by infectious disease and patient was discharged on omadacycline.  Patient was advised to follow-up with orthopedics and infectious diseases.  Patient had undergone irrigation and debridement of right knee three times as an outpatient.  Recently has not followed up with orthopedics , now presented with worsening pain and swelling of right knee associated with purulent drainage.  Patient reports fall 2 days ago and has been having pain and swelling in the right knee afterwards.  Patient denies any head injury, loss of consciousness, fever, nausea, vomiting, sick contact, recent travel.  Has discussed with orthopedics Dr. Jena Gauss,  advised to come to the ED.   ED Course: He is hemodynamically stable except hypertension Temp 98.7, HR 73, RR 16, BP 160/110, SpO2 98% on room air Labs include sodium 134, potassium 3.6, chloride 101, bicarb 23, glucose 95, BUN 11, creatinine 0.90, calcium 9.1, ammonia 49, lactic acid 2.1, WBC 10.9, hemoglobin 14.0, hematocrit 42.6, platelet 124, EtOH level less than 10, blood cultures were obtained. Right knee x-ray showed Status post ORIF of the proximal tibia for a tibial plateau fracture. Revisualization of a comminuted appearance of  the tibial plateau, similar in comparison to prior CT. Similar appearance of a lucency abutting the tibial plateau compared to prior. Large joint effusion.  Soft tissue edema.   Review of Systems:   Review of Systems  Constitutional: Negative.   HENT: Negative.    Eyes: Negative.   Respiratory: Negative.    Cardiovascular: Negative.   Gastrointestinal: Negative.   Genitourinary: Negative.   Musculoskeletal:        Right knee pain, swelling, erythema, effusion  Skin: Negative.   Neurological: Negative.   Endo/Heme/Allergies: Negative.   Psychiatric/Behavioral: Negative.      Past Medical History:  Diagnosis Date   Alcohol abuse    Cirrhosis of liver (HCC)    Hypertension     Past Surgical History:  Procedure Laterality Date   CHOLECYSTECTOMY     IR TIPS     IRRIGATION AND DEBRIDEMENT KNEE Right 12/29/2022   Procedure: IRRIGATION AND DEBRIDEMENT KNEE;  Surgeon: Roby Lofts, MD;  Location: [facility] OR;  Service: Orthopedics;  Laterality: Right;   IRRIGATION AND DEBRIDEMENT KNEE Right 01/12/2023   Procedure: IRRIGATION AND DEBRIDEMENT KNEE;  Surgeon: Roby Lofts, MD;  Location: [facility] OR;  Service: Orthopedics;  Laterality: Right;   ORIF TIBIA PLATEAU Right 12/13/2022   Procedure: OPEN REDUCTION INTERNAL FIXATION (ORIF) TIBIAL PLATEAU;  Surgeon: Roby Lofts, MD;  Location: [facility] OR;  Service: Orthopedics;  Laterality: Right;     reports that he has been smoking cigarettes. He has never used smokeless tobacco. He reports current drug use. Drugs: Cocaine and Marijuana. He reports that he does not drink alcohol.  Allergies  Allergen Reactions   Tylenol [Acetaminophen] Other (See  Comments)    Impacts liver    No family history on file.  Family history reviewed and not pertinent .  Prior to Admission medications   Medication Sig Start Date End Date Taking? Authorizing Provider  folic acid (FOLVITE) 1 MG tablet Take 1 tablet (1 mg total) by mouth daily. 02/04/23   Ghimire,  Werner Lean, MD  lactulose (CHRONULAC) 10 GM/15ML solution Take 30 mLs (20 g total) by mouth 3 (three) times daily. 02/04/23   Ghimire, Werner Lean, MD  methocarbamol (ROBAXIN) 750 MG tablet Take 1 tablet (750 mg total) by mouth every 8 (eight) hours as needed for muscle spasms. 02/04/23   Ghimire, Werner Lean, MD  metoprolol tartrate (LOPRESSOR) 25 MG tablet Take 1 tablet (25 mg total) by mouth 2 (two) times daily. 02/04/23   Ghimire, Werner Lean, MD  midodrine (PROAMATINE) 10 MG tablet Take 1 tablet (10 mg total) by mouth 3 (three) times daily with meals. 02/04/23   Ghimire, Werner Lean, MD  Omadacycline Tosylate 150 MG TABS Take 2 tablets (300 mg total) by mouth daily. 02/04/23   Comer, Belia Heman, MD  Oxycodone HCl 10 MG TABS Take 0.5-1 tablets (5-10 mg total) by mouth every 6 (six) hours as needed for pain. 02/16/23     Oxycodone HCl 10 MG TABS Take 1/2-1 tablet (5-10 mg total) by mouth every 6 (six) hours as needed for pain. 03/08/23     polyethylene glycol powder (SB POLYETHYLENE GLYCOL 3350) 17 GM/SCOOP powder Take 1 capful (17 g) by mouth daily. Patient not taking: Reported on 12/28/2022 12/17/22   Burnadette Pop, MD  thiamine (VITAMIN B1) 100 MG tablet Take 1 tablet (100 mg total) by mouth daily. 02/04/23   Ghimire, Werner Lean, MD  Vitamin D, Ergocalciferol, (DRISDOL) 1.25 MG (50000 UNIT) CAPS capsule Take 1 capsule (50,000 Units total) by mouth every 7 (seven) days. 02/11/23   Maretta Bees, MD    Physical Exam: Vitals:   04/16/23 1242 04/16/23 1635  BP: (!) 160/110   Pulse: 73   Resp: 16   Temp: 98.7 F (37.1 C) 97.9 F (36.6 C)  TempSrc: Oral Oral  SpO2: 98%     Constitutional: Appears comfortable, not in any acute distress. Vitals:   04/16/23 1242 04/16/23 1635  BP: (!) 160/110   Pulse: 73   Resp: 16   Temp: 98.7 F (37.1 C) 97.9 F (36.6 C)  TempSrc: Oral Oral  SpO2: 98%    Eyes: PERRL, lids and conjunctivae normal ENMT: Mucous membranes are moist. Posterior pharynx clear of  any exudate or lesions. Neck: normal, supple, no masses, no thyromegaly Respiratory: CTA Bilaterally , No wheezing, No crackles. Normal respiratory effort. No accessory muscle use.  Cardiovascular: Regular rate and rhythm, no murmurs / rubs / gallops. No extremity edema.No carotid bruits.  Abdomen: Soft, distended  , no tenderness, no masses palpated. Bowel sounds positive.  Musculoskeletal: no clubbing / cyanosis.  Right knee swollen, tender, erythematous, warm.  Normal muscle tone.  Skin: no rashes, lesions, ulcers. No induration Neurologic: CN 2-12 grossly intact. Sensation intact, DTR normal. Strength 5/5 in all 4.  Psychiatric: Normal judgment and insight. Alert and oriented x 3. Normal mood.     Labs on Admission: I have personally reviewed following labs and imaging studies  CBC: Recent Labs  Lab 04/16/23 1439  WBC 10.9*  NEUTROABS 8.8*  HGB 14.0  HCT 42.6  MCV 89.9  PLT 124*   Basic Metabolic Panel: Recent Labs  Lab 04/16/23 1439  NA 135  K 3.6  CL 101  CO2 23  GLUCOSE 95  BUN 11  CREATININE 0.90  CALCIUM 9.1   GFR: CrCl cannot be calculated (Unknown ideal weight.). Liver Function Tests: No results for input(s): "AST", "ALT", "ALKPHOS", "BILITOT", "PROT", "ALBUMIN" in the last 168 hours. No results for input(s): "LIPASE", "AMYLASE" in the last 168 hours. Recent Labs  Lab 04/16/23 1453  AMMONIA 49*   Coagulation Profile: No results for input(s): "INR", "PROTIME" in the last 168 hours. Cardiac Enzymes: No results for input(s): "CKTOTAL", "CKMB", "CKMBINDEX", "TROPONINI" in the last 168 hours. BNP (last 3 results) No results for input(s): "PROBNP" in the last 8760 hours. HbA1C: No results for input(s): "HGBA1C" in the last 72 hours. CBG: No results for input(s): "GLUCAP" in the last 168 hours. Lipid Profile: No results for input(s): "CHOL", "HDL", "LDLCALC", "TRIG", "CHOLHDL", "LDLDIRECT" in the last 72 hours. Thyroid Function Tests: No results for  input(s): "TSH", "T4TOTAL", "FREET4", "T3FREE", "THYROIDAB" in the last 72 hours. Anemia Panel: No results for input(s): "VITAMINB12", "FOLATE", "FERRITIN", "TIBC", "IRON", "RETICCTPCT" in the last 72 hours. Urine analysis:    Component Value Date/Time   COLORURINE AMBER (A) 10/01/2022 0142   APPEARANCEUR CLOUDY (A) 10/01/2022 0142   LABSPEC 1.025 10/01/2022 0142   PHURINE 5.0 10/01/2022 0142   GLUCOSEU NEGATIVE 10/01/2022 0142   HGBUR SMALL (A) 10/01/2022 0142   BILIRUBINUR NEGATIVE 10/01/2022 0142   KETONESUR NEGATIVE 10/01/2022 0142   PROTEINUR 30 (A) 10/01/2022 0142   NITRITE NEGATIVE 10/01/2022 0142   LEUKOCYTESUR LARGE (A) 10/01/2022 0142    Radiological Exams on Admission: DG Knee 2 Views Right Result Date: 04/16/2023 CLINICAL DATA:  fall, pain EXAM: RIGHT KNEE - 1-2 VIEW COMPARISON:  January 12, 2023, February 03, 2023 FINDINGS: Osteopenia. Status post ORIF of the proximal tibia for a tibial plateau fracture. Revisualization of a comminuted appearance of the tibial plateau, similar comparison to prior CT. Similar appearance of a lucent area along the tibial plateau compared to prior CT. Large joint effusion. Soft tissue edema. IMPRESSION: 1. Status post ORIF of the proximal tibia for a tibial plateau fracture. Revisualization of a comminuted appearance of the tibial plateau, similar in comparison to prior CT. Similar appearance of a lucency abutting the tibial plateau compared to prior. 2. Large joint effusion.  Soft tissue edema. Electronically Signed   By: Meda Klinefelter M.D.   On: 04/16/2023 14:06    EKG: EKG ordered.  Please review  Assessment/Plan Principal Problem:   Septic joint of right knee joint (HCC) Active Problems:   Thrombocytopenia (HCC)   Essential hypertension   Hepatic cirrhosis (HCC)   Polysubstance abuse (HCC)   S/P TIPS (transjugular intrahepatic portosystemic shunt)   Atrial fibrillation, chronic (HCC)  Right Knee Effusion: Hx.of Septic  arthritis of right knee: This patient presented with worsening pain and swelling of the right knee following fall 2 days ago. Patient has complicated postoperative course following right plateau fracture. Patient underwent incision and drainage thrice as an outpatient prior to this hospitalization. He was treated with IV ertapenem and subsequently was discharged on omadacycline on last admission. Xray showed large joint effusion. EDP discussed the case with Dr. Jena Gauss ,who recommended washout in the morning. Continue empiric antibiotics (Vancomycin and Zosyn ) Follow-up blood cultures. N.p.o. midnight for procedure in the morning. Adequate pain control with Dilaudid as needed. Consult infectious diseases in the morning.   Atrial fibrillation: Heart rate controlled on metoprolol. Patient is not on anticoagulation, CHA2DS2-VASc score 1.  Liver cirrhosis status post TIPS: Appears well compensated. Volume status relatively stable. Continue lactulose. Ammonia level slightly elevated 48.   Polysubstance abuse: Patient acknowledges ongoing THC use and remote cocaine use. Obtain urine drug screen.  Ongoing counseling.  Obesity: Estimated body mass index is 35.26 kg/m as calculated from the following:   Height as of this encounter: 6' (1.829 m).   Weight as of this encounter: 117.9 kg.  Diet and exercise discussed in detail.  Thrombocytopenia: Stable.  likely due to liver cirrhosis.  DVT prophylaxis: SCDs Code Status: Full code Family Communication: No family at bed side Disposition Plan:    Status is: Inpatient Remains inpatient appropriate because: Septic arthritis of right knee ?// Knee Effusion   Consults called: Orthopaedics ( [place]) Admission status: Inpatient   Willeen Niece MD [facility]  If 7PM-7AM, please contact night-coverage   04/16/2023, 5:00 PM

## 2023-04-16 NOTE — Progress Notes (Signed)
Pharmacy Antibiotic Note  Jonathan Ibarra is a 58 y.o. male for which pharmacy has been consulted for vancomycin and zosyn dosing for  concern for septic right knee .  Patient with a history of cirrhosis, TIPS procedure, hepatic encephalopathy, polysubstance abuse. Patient is s/p ORIF of proximal rt tibial plateau presenting with concern for septic knee.  SCr 0.9 WBC 10.9; LA 2.1; T 98.7; HR 73; RR 16  Plan: Zosyn 3.375g IV q8h (4 hour infusion) Vancomycin 2000 mg once then 1250 mg q12hr (eAUC 474.1) unless change in renal function (estimating 120 kg TBW, VD 0.5) Monitor WBC, fever, renal function, cultures De-escalate when able Levels at steady state     Temp (24hrs), Avg:98.7 F (37.1 C), Min:98.7 F (37.1 C), Max:98.7 F (37.1 C)  Recent Labs  Lab 04/16/23 1439 04/16/23 1459  WBC 10.9*  --   CREATININE 0.90  --   LATICACIDVEN  --  2.1*    CrCl cannot be calculated (Unknown ideal weight.).    Allergies  Allergen Reactions   Tylenol [Acetaminophen] Other (See Comments)    Impacts liver   Microbiology results: Pending  Thank you for allowing pharmacy to be a part of this patient's care.  Delmar Landau, PharmD, BCPS 04/16/2023 4:09 PM ED Clinical Pharmacist -  [phone]

## 2023-04-16 NOTE — ED Provider Notes (Signed)
[place] EMERGENCY DEPARTMENT AT [facility] Provider Note   CSN: [id number] Arrival date & time: 04/16/23  1239     History  Chief Complaint  Patient presents with   Knee Pain    Jonathan Ibarra is a 58 y.o. male with past medical history of cirrhosis, TIPS procedure, hepatic encephalopathy, polysubstance abuse, septic arthritis of right knee, drainage from wound, wound infection status post ORIF senting to emergency room with 2 days of severe right knee pain.  Patient reports that 2 days ago he fell off the couch landing on his knee.  Since then he has noticed purulent drainage, foul smell and severe knee pain and redness.  Patient denies any fevers, chills or other associated symptoms.  Patient reports patiently with orthopedics who felt that healing was going appropriately.   Knee Pain      Home Medications Prior to Admission medications   Medication Sig Start Date End Date Taking? Authorizing Provider  folic acid (FOLVITE) 1 MG tablet Take 1 tablet (1 mg total) by mouth daily. 02/04/23   Ghimire, Werner Lean, MD  lactulose (CHRONULAC) 10 GM/15ML solution Take 30 mLs (20 g total) by mouth 3 (three) times daily. 02/04/23   Ghimire, Werner Lean, MD  methocarbamol (ROBAXIN) 750 MG tablet Take 1 tablet (750 mg total) by mouth every 8 (eight) hours as needed for muscle spasms. 02/04/23   Ghimire, Werner Lean, MD  metoprolol tartrate (LOPRESSOR) 25 MG tablet Take 1 tablet (25 mg total) by mouth 2 (two) times daily. 02/04/23   Ghimire, Werner Lean, MD  midodrine (PROAMATINE) 10 MG tablet Take 1 tablet (10 mg total) by mouth 3 (three) times daily with meals. 02/04/23   Ghimire, Werner Lean, MD  Omadacycline Tosylate 150 MG TABS Take 2 tablets (300 mg total) by mouth daily. 02/04/23   Comer, Belia Heman, MD  Oxycodone HCl 10 MG TABS Take 0.5-1 tablets (5-10 mg total) by mouth every 6 (six) hours as needed for pain. 02/16/23     Oxycodone HCl 10 MG TABS Take 1/2-1 tablet (5-10 mg total) by  mouth every 6 (six) hours as needed for pain. 03/08/23     polyethylene glycol powder (SB POLYETHYLENE GLYCOL 3350) 17 GM/SCOOP powder Take 1 capful (17 g) by mouth daily. Patient not taking: Reported on 12/28/2022 12/17/22   Burnadette Pop, MD  thiamine (VITAMIN B1) 100 MG tablet Take 1 tablet (100 mg total) by mouth daily. 02/04/23   Ghimire, Werner Lean, MD  Vitamin D, Ergocalciferol, (DRISDOL) 1.25 MG (50000 UNIT) CAPS capsule Take 1 capsule (50,000 Units total) by mouth every 7 (seven) days. 02/11/23   Ghimire, Werner Lean, MD      Allergies    Tylenol [acetaminophen]    Review of Systems   Review of Systems  Musculoskeletal:  Positive for arthralgias.    Physical Exam Updated Vital Signs BP (!) 160/110 (BP Location: Right Arm)   Pulse 73   Temp 98.7 F (37.1 C) (Oral)   Resp 16   SpO2 98%  Physical Exam Vitals and nursing note reviewed.  Constitutional:      General: He is not in acute distress.    Appearance: He is not toxic-appearing.  HENT:     Head: Normocephalic and atraumatic.  Eyes:     General: No scleral icterus.    Conjunctiva/sclera: Conjunctivae normal.  Cardiovascular:     Rate and Rhythm: Normal rate and regular rhythm.     Pulses: Normal pulses.  Heart sounds: Normal heart sounds.  Pulmonary:     Effort: Pulmonary effort is normal. No respiratory distress.     Breath sounds: Normal breath sounds.  Abdominal:     General: Abdomen is flat. Bowel sounds are normal.     Palpations: Abdomen is soft.     Tenderness: There is no abdominal tenderness.  Musculoskeletal:     Right lower leg: No edema.     Left lower leg: No edema.     Comments: Right knee swelling.  Incision intact with small area of scab with drainage.  Small amount of surrounding erythema.  Skin:    General: Skin is warm and dry.     Findings: No lesion.  Neurological:     General: No focal deficit present.     Mental Status: He is alert and oriented to person, place, and time. Mental  status is at baseline.        ED Results / Procedures / Treatments   Labs (all labs ordered are listed, but only abnormal results are displayed) Labs Reviewed - No data to display  EKG None  Radiology DG Knee 2 Views Right Result Date: 04/16/2023 CLINICAL DATA:  fall, pain EXAM: RIGHT KNEE - 1-2 VIEW COMPARISON:  January 12, 2023, February 03, 2023 FINDINGS: Osteopenia. Status post ORIF of the proximal tibia for a tibial plateau fracture. Revisualization of a comminuted appearance of the tibial plateau, similar comparison to prior CT. Similar appearance of a lucent area along the tibial plateau compared to prior CT. Large joint effusion. Soft tissue edema. IMPRESSION: 1. Status post ORIF of the proximal tibia for a tibial plateau fracture. Revisualization of a comminuted appearance of the tibial plateau, similar in comparison to prior CT. Similar appearance of a lucency abutting the tibial plateau compared to prior. 2. Large joint effusion.  Soft tissue edema. Electronically Signed   By: Meda Klinefelter M.D.   On: 04/16/2023 14:06    Procedures Procedures    Medications Ordered in ED Medications - No data to display  ED Course/ Medical Decision Making/ A&P Clinical Course as of 04/16/23 1722  Sat Apr 16, 2023  1515 Chronic cirrhosis, TIPs procedure, h/f concern for septic join ( Status post ORIF of the proximal tibia for a tibial plateau). Has had multiple washout procedures, now having drainage from prior washout procedure [RK]  1526 Spoke with Dr Jena Gauss who recommends IV Antibiotics and admit to hospital team. They agree to consult.  [JB]  1553 Start antibiotics per ortho [RK]    Clinical Course User Index [JB] Nyjai Graff, Horald Chestnut, PA-C [RK] Caron Presume, MD                                 Medical Decision Making Amount and/or Complexity of Data Reviewed Labs: ordered. Radiology: ordered.  Risk Prescription drug management. Decision regarding  hospitalization.   This patient presents to the ED for concern of right knee pain, this involves an extensive number of treatment options, and is a complaint that carries with it a high risk of complications and morbidity.  The differential diagnosis includes arthritis, septic joint, fracture, dislocation, abscess, cellulitis   Additional history obtained:  Additional history obtained from chart review from recent visit for similar thing in December and also in November.   Lab Tests:  I personally interpreted labs.  The pertinent results include:   Ordering CBC, lactic, blood cultures   Imaging  Studies ordered:  I ordered imaging studies including right knee  I independently visualized and interpreted imaging which showed that is post ORIF with large joint effusion I agree with the radiologist interpretation   Please consult orthopedics and spoke with Dr. Jena Gauss.  Dr. Jena Gauss reports patient is not come to follow-up appointments.  Recommends admission to medicine and IV antibiotics.  I discussed patient's presentation, labs and vitals.  Ortho surgery will consult and see the patient either today or tomorrow.   Problem List / ED Course / Critical interventions / Medication management  On initial physical exam patient is screaming in pain holding right knee endorsing pain.  Knee is erythematous with small area of drainage.  Given history I am concerned for infection and postop complication.  Patient is hemodynamically stable without fever without tachycardia thus not meeting SIRS criteria at this time.  Will control pain and check labs and reassess. Patient's imaging shows no acute fracture or dislocation.  Does show large joint effusion.  Given clinical presentation, elevated white count feel this is likely cellulitis and concern for septic joint versus abscess.  Will start IV antibiotics I have placed consult to pharmacy for antibiotics. I ordered medication including Morphine, Zofran   Reevaluation of the patient after these medicines showed that the patient stayed the same I have reviewed the patients home medicines and have made adjustments as needed   Plan  Patient needs to be admitted for IV antibiotics and concern for septic joint.  I have already spoke with orthopedics.  Resident received this patient has signed off and will consult call for admitting.         Final Clinical Impression(s) / ED Diagnoses Final diagnoses:  Effusion of right knee joint    Rx / DC Orders ED Discharge Orders     None         Smitty Knudsen, PA-C 04/16/23 1726    Wynetta Fines, MD 04/17/23 [phone]

## 2023-04-16 NOTE — ED Triage Notes (Signed)
Patient from home via EMS for eval of R knee pain and swelling after falling off the couch 2 days ago. Discharge and foul smell from same. Has not been ambulatory since falling. Denies dizziness prior to fall or syncope. Did not hit head.

## 2023-04-17 ENCOUNTER — Encounter (HOSPITAL_COMMUNITY): Payer: Self-pay | Admitting: Family Medicine

## 2023-04-17 DIAGNOSIS — I4891 Unspecified atrial fibrillation: Secondary | ICD-10-CM

## 2023-04-17 DIAGNOSIS — M00061 Staphylococcal arthritis, right knee: Secondary | ICD-10-CM | POA: Diagnosis not present

## 2023-04-17 DIAGNOSIS — R7881 Bacteremia: Secondary | ICD-10-CM | POA: Diagnosis not present

## 2023-04-17 DIAGNOSIS — B9562 Methicillin resistant Staphylococcus aureus infection as the cause of diseases classified elsewhere: Secondary | ICD-10-CM

## 2023-04-17 DIAGNOSIS — M25461 Effusion, right knee: Principal | ICD-10-CM

## 2023-04-17 DIAGNOSIS — F17210 Nicotine dependence, cigarettes, uncomplicated: Secondary | ICD-10-CM

## 2023-04-17 LAB — CBC
HCT: 44.2 % (ref 39.0–52.0)
Hemoglobin: 14 g/dL (ref 13.0–17.0)
MCH: 29.6 pg (ref 26.0–34.0)
MCHC: 31.7 g/dL (ref 30.0–36.0)
MCV: 93.4 fL (ref 80.0–100.0)
Platelets: 164 10*3/uL (ref 150–400)
RBC: 4.73 MIL/uL (ref 4.22–5.81)
RDW: 15.5 % (ref 11.5–15.5)
WBC: 13.7 10*3/uL — ABNORMAL HIGH (ref 4.0–10.5)
nRBC: 0 % (ref 0.0–0.2)

## 2023-04-17 LAB — BLOOD CULTURE ID PANEL (REFLEXED) - BCID2

## 2023-04-17 LAB — COMPREHENSIVE METABOLIC PANEL
ALT: 18 U/L (ref 0–44)
AST: 19 U/L (ref 15–41)
Albumin: 2.9 g/dL — ABNORMAL LOW (ref 3.5–5.0)
Alkaline Phosphatase: 91 U/L (ref 38–126)
Anion gap: 9 (ref 5–15)
BUN: 14 mg/dL (ref 6–20)
CO2: 26 mmol/L (ref 22–32)
Calcium: 9 mg/dL (ref 8.9–10.3)
Chloride: 99 mmol/L (ref 98–111)
Creatinine, Ser: 1.13 mg/dL (ref 0.61–1.24)
GFR, Estimated: >60 mL/min (ref >60)
Glucose, Bld: 102 mg/dL — ABNORMAL HIGH (ref 70–99)
Potassium: 4.2 mmol/L (ref 3.5–5.1)
Sodium: 134 mmol/L — ABNORMAL LOW (ref 135–145)
Total Bilirubin: 4.1 mg/dL — ABNORMAL HIGH (ref 0.0–1.2)
Total Protein: 6.6 g/dL (ref 6.5–8.1)

## 2023-04-17 LAB — BLOOD GAS, ARTERIAL
Acid-base deficit: 0.8 mmol/L (ref 0.0–2.0)
Bicarbonate: 25.4 mmol/L (ref 20.0–28.0)
Drawn by: 24485
O2 Saturation: 85.7 %
Patient temperature: 37
pCO2 arterial: 47 mm[Hg] (ref 32–48)
pH, Arterial: 7.34 — ABNORMAL LOW (ref 7.35–7.45)
pO2, Arterial: 54 mm[Hg] — ABNORMAL LOW (ref 83–108)

## 2023-04-17 LAB — TROPONIN I (HIGH SENSITIVITY)
Troponin I (High Sensitivity): 14 ng/L (ref <18)
Troponin I (High Sensitivity): 17 ng/L (ref <18)

## 2023-04-17 LAB — LACTIC ACID, PLASMA
Lactic Acid, Venous: 2.1 mmol/L (ref 0.5–1.9)
Lactic Acid, Venous: 2.9 mmol/L (ref 0.5–1.9)

## 2023-04-17 LAB — PHOSPHORUS: Phosphorus: 3.2 mg/dL (ref 2.5–4.6)

## 2023-04-17 LAB — MAGNESIUM: Magnesium: 1.6 mg/dL — ABNORMAL LOW (ref 1.7–2.4)

## 2023-04-17 LAB — TSH: TSH: 4.815 u[IU]/mL — ABNORMAL HIGH (ref 0.350–4.500)

## 2023-04-17 MED ORDER — OXYCODONE HCL 5 MG PO TABS
5.0000 mg | ORAL_TABLET | ORAL | Status: DC | PRN
  Administered 2023-04-17 – 2023-04-18 (×3): 5 mg via ORAL
  Filled 2023-04-17 (×4): qty 1

## 2023-04-17 MED ORDER — MAGNESIUM SULFATE 2 GM/50ML IV SOLN: 2.0000 g | Freq: Once | INTRAVENOUS | Status: DC

## 2023-04-17 MED ORDER — SODIUM CHLORIDE 0.9 % IV SOLN
1.0000 g | Freq: Once | INTRAVENOUS | Status: AC
  Administered 2023-04-17: 1 g via INTRAVENOUS
  Filled 2023-04-17: qty 1000

## 2023-04-17 MED ORDER — VANCOMYCIN HCL IN DEXTROSE 1-5 GM/200ML-% IV SOLN
1000.0000 mg | Freq: Two times a day (BID) | INTRAVENOUS | Status: DC
  Administered 2023-04-17: 1000 mg via INTRAVENOUS
  Filled 2023-04-17: qty 200

## 2023-04-17 MED ORDER — MAGNESIUM SULFATE 2 GM/50ML IV SOLN
2.0000 g | Freq: Once | INTRAVENOUS | Status: AC
  Administered 2023-04-17: 2 g via INTRAVENOUS
  Filled 2023-04-17: qty 50

## 2023-04-17 MED ORDER — PIPERACILLIN-TAZOBACTAM 3.375 G IVPB: 3.3750 g | Freq: Three times a day (TID) | INTRAVENOUS | Status: DC

## 2023-04-17 MED ORDER — DAPTOMYCIN 500 MG IV SOLR
8.0000 mg/kg | Freq: Every day | INTRAVENOUS | Status: DC
  Administered 2023-04-17: 800 mg via INTRAVENOUS
  Filled 2023-04-17 (×2): qty 16

## 2023-04-17 NOTE — Progress Notes (Signed)
PHARMACY - PHYSICIAN COMMUNICATION CRITICAL VALUE ALERT - BLOOD CULTURE IDENTIFICATION (BCID)  Jonathan Ibarra is an 58 y.o. male who presented to [facility] on 04/16/2023 with a chief complaint of R knee swelling s/p fall  Assessment:  BCID positive for MRSA in 2 of 4 anaerobic bottles, R knee effusion source  Name of physician (or Provider) Contacted: Dr. Idelle Leech  Current antibiotics: vanc/zosyn  Changes to prescribed antibiotics recommended:  Patient is on recommended antibiotics - No changes needed  Results for orders placed or performed during the hospital encounter of 09/30/22  Blood Culture ID Panel (Reflexed) (Collected: 09/30/2022 10:46 PM)  Result Value Ref Range   Enterococcus faecalis NOT DETECTED NOT DETECTED   Enterococcus Faecium NOT DETECTED NOT DETECTED   Listeria monocytogenes NOT DETECTED NOT DETECTED   Staphylococcus species NOT DETECTED NOT DETECTED   Staphylococcus aureus (BCID) NOT DETECTED NOT DETECTED   Staphylococcus epidermidis NOT DETECTED NOT DETECTED   Staphylococcus lugdunensis NOT DETECTED NOT DETECTED   Streptococcus species DETECTED (A) NOT DETECTED   Streptococcus agalactiae NOT DETECTED NOT DETECTED   Streptococcus pneumoniae NOT DETECTED NOT DETECTED   Streptococcus pyogenes DETECTED (A) NOT DETECTED   A.calcoaceticus-baumannii NOT DETECTED NOT DETECTED   Bacteroides fragilis NOT DETECTED NOT DETECTED   Enterobacterales NOT DETECTED NOT DETECTED   Enterobacter cloacae complex NOT DETECTED NOT DETECTED   Escherichia coli NOT DETECTED NOT DETECTED   Klebsiella aerogenes NOT DETECTED NOT DETECTED   Klebsiella oxytoca NOT DETECTED NOT DETECTED   Klebsiella pneumoniae NOT DETECTED NOT DETECTED   Proteus species NOT DETECTED NOT DETECTED   Salmonella species NOT DETECTED NOT DETECTED   Serratia marcescens NOT DETECTED NOT DETECTED   Haemophilus influenzae NOT DETECTED NOT DETECTED   Neisseria meningitidis NOT DETECTED NOT DETECTED   Pseudomonas  aeruginosa NOT DETECTED NOT DETECTED   Stenotrophomonas maltophilia NOT DETECTED NOT DETECTED   Candida albicans NOT DETECTED NOT DETECTED   Candida auris NOT DETECTED NOT DETECTED   Candida glabrata NOT DETECTED NOT DETECTED   Candida krusei NOT DETECTED NOT DETECTED   Candida parapsilosis NOT DETECTED NOT DETECTED   Candida tropicalis NOT DETECTED NOT DETECTED   Cryptococcus neoformans/gattii NOT DETECTED NOT DETECTED    Jonathan Ibarra 04/17/2023  8:01 AM

## 2023-04-17 NOTE — Progress Notes (Signed)
Patient is currently requesting pain medication for his right knee pain. Hospitalist MD and orthto MD notified.  Kenard Gower, RN

## 2023-04-17 NOTE — Consult Note (Addendum)
Date of Admission:  04/16/2023          Reason for Consult: MRSA bacteremia in patient with prior ESBL HW associated septic arthritis and osteomyelitis right femur    Referring Provider: Auto consult and Dr. 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 sp 2 surgeries and checked at hospital stay with 4 weeks of Invanz having been given after his last surgery followed by a omadacycline which she was discharged with but with him NEVER having followed up with [place] in ID or Orthopedic surgery Alcoholic cirrhosis with prior encephalopathy and thrombocytopenia likely esophageal varices Sp TIPS Atrial fibrillation Hep C + but RNA negative  Plan:  Exchange vancomycin for daptomycin and send MRSA for daptomycin susceptibilities DC other antibiotics to increase yield on intraoperative cultures He is going to need formal I and D of his knee and removal of hardware for source control and I would expect Dr. Jena Gauss can accomplish this during this admission TTE, would not do TEE though Her for "metastatic sites" of infection but currently the only site other than his blood that appears infected is his right knee Contact cautions for MRSA and ESBL  Principal Problem:   Septic joint of right knee joint (HCC) Active Problems:   Thrombocytopenia (HCC)   Essential hypertension   Hepatic cirrhosis (HCC)   Polysubstance abuse (HCC)   S/P TIPS (transjugular intrahepatic portosystemic shunt)   Atrial fibrillation, chronic (HCC)   Scheduled Meds:  docusate sodium  100 mg Oral BID   folic acid  1 mg Oral Daily   lactulose  20 g Oral TID   metoprolol tartrate  25 mg Oral BID   senna  1 tablet Oral BID   thiamine  100 mg Oral Daily   Continuous Infusions:  sodium chloride 40 mL/hr at 04/16/23  1824   diltiazem (CARDIZEM) infusion 10 mg/hr (04/17/23 0616)   magnesium sulfate bolus IVPB 2 g (04/17/23 1112)   vancomycin     PRN Meds:.hydrALAZINE, methocarbamol, ondansetron **OR** ondansetron (ZOFRAN) IV, oxyCODONE  HPI: Jonathan Ibarra is a 58 y.o. male history of alcoholic cirrhosis with problems with hepatic encephalopathy thrombocytopenia, hx of TIPS for presumed esophageal varices, atrial fibrillation who had been admitted in October for a trauma after a fall that required ORIF of the right tibial plateau by Dr. Jena Gauss.  He was then again admitted another fall and concern for infection of the right knee joint.  He was taken to the operating room on the 30th and underwent I&D the infection extended down to the right tibial plateau to hardware.  There was no overt evidence of osteomyelitis apparently at that time.  Cultures yielded and ESBL klebsiella PNA.   Was treated with ertapenem and he was taken back to the operating room again on November 13 due to purulence the incision site and again was found to have purulent and fibrinous material in the around the hardware.  Cultures at that time did not yield any organism.  He then was treated with ertapenem for 4 weeks prior to transition to oral omadacycline.  He was discharged on 6 December but not seen for follow-up.  I can only see 1 dispense of the nuzyra.  Patient admits that he has not been on antibiotics.  Then fell off of his couch  and struck his knee again the knee began to hurt yet again.  Due to the worsening of the pain with swelling and some purulent drainage from the knee he came to the emergency department for evaluation.  Blood cultures were taken which have subsequently yielded methicillin resistant Staphylococcus aureus.  He has been seen by Dr. Jena Gauss who is contemplating aspiration of the joint.   I think he clearly  will need surgery to the knee to achieve SOURCE control.  I would agree with idea of formal I  and D and removal of hardware this week.  He has PROVEN he is NOT someone who can take chronic suppresive antibiotics.  The patient did get a dose of ertapenem and Zosyn.  I have stopped those antibiotics so that we can increase the yield on intraoperative cultures.  He certainly needs the antibiotic to treat the MRSA in the blood.  I will plan on changing him from vancomycin to daptomycin.  I will repeat his blood cultures tomorrow.  I will order a 2D echocardiogram done.  I would be reluctant to do a TEE since he has high Ackley hood of esophageal varices still being present.  Additionally TEE would not change management as duration of antibiotics would be the same and he would not be a cardiothoracic surgery candidate  He had a positive hepatitis C antibody but no hep C detected in October when he was tested for this he was negative for hepatitis B and also again for HIV.  Denies intravenous drug use history.  I have personally spent 83 minutes involved in face-to-face and non-face-to-face activities for this patient on the day of the visit. Professional time spent includes the following activities: Preparing to see the patient (review of tests), Obtaining and/or reviewing separately obtained history (admission/discharge record), Performing a medically appropriate examination and/or evaluation , Ordering medications/tests/procedures, referring and communicating with other health care professionals, Documenting clinical information in the EMR, Independently interpreting results (not separately reported), Communicating results to the patient/family/caregiver, Counseling and educating the patient/family/caregiver and Care coordination (not separately reported).    Evaluation of the patient requires complex antimicrobial therapy evaluation, counseling , isolation needs to reduce disease transmission and risk assessment and mitigation.       Review of Systems: Review of Systems   Constitutional:  Negative for chills, fever, malaise/fatigue and weight loss.  HENT:  Negative for congestion and sore throat.   Eyes:  Negative for blurred vision and photophobia.  Respiratory:  Negative for cough, shortness of breath and wheezing.   Cardiovascular:  Negative for chest pain, palpitations and leg swelling.  Gastrointestinal:  Negative for abdominal pain, blood in stool, constipation, diarrhea, heartburn, melena, nausea and vomiting.  Genitourinary:  Negative for dysuria, flank pain and hematuria.  Musculoskeletal:  Positive for joint pain. Negative for back pain, falls and myalgias.  Skin:  Negative for itching and rash.  Neurological:  Negative for dizziness, focal weakness, loss of consciousness, weakness and headaches.  Endo/Heme/Allergies:  Does not bruise/bleed easily.  Psychiatric/Behavioral:  Negative for depression and suicidal ideas. The patient does not have insomnia.     Past Medical History:  Diagnosis Date   Alcohol abuse    Cirrhosis of liver (HCC)    Hypertension    PAF (paroxysmal atrial fibrillation) (HCC) 12/28/2022   SVT (supraventricular tachycardia) (HCC) 09/2022    Social History   Tobacco Use   Smoking status: Every Day    Current packs/day: 0.25    Types: Cigarettes  Smokeless tobacco: Never  Vaping Use   Vaping status: Every Day  Substance Use Topics   Alcohol use: No   Drug use: Yes    Types: Cocaine, Marijuana    No family history on file. Allergies  Allergen Reactions   Tylenol [Acetaminophen] Other (See Comments)    Impacts liver    OBJECTIVE: Blood pressure 115/76, pulse 81, temperature 98.3 F (36.8 C), temperature source Oral, resp. rate 18, height 6' 0.01" (1.829 m), weight 118.2 kg, SpO2 97%.  Physical Exam Constitutional:      Appearance: He is well-developed.  HENT:     Head: Normocephalic and atraumatic.  Eyes:     Conjunctiva/sclera: Conjunctivae normal.  Cardiovascular:     Rate and Rhythm: Normal rate  and regular rhythm.     Heart sounds: No murmur heard.    No friction rub. No gallop.  Pulmonary:     Effort: Pulmonary effort is normal. No respiratory distress.     Breath sounds: No stridor. No wheezing or rhonchi.  Abdominal:     General: There is no distension.     Palpations: Abdomen is soft.     Hernia: No hernia is present.  Musculoskeletal:     Cervical back: Normal range of motion and neck supple.     Right knee: Swelling and effusion present. Decreased range of motion. Tenderness present.  Skin:    General: Skin is warm and dry.     Coloration: Skin is not pale.     Findings: No erythema or rash.  Neurological:     General: No focal deficit present.     Mental Status: He is alert and oriented to person, place, and time.  Psychiatric:        Mood and Affect: Mood normal.        Behavior: Behavior normal.        Thought Content: Thought content normal.        Judgment: Judgment normal.    Right knee    Lab Results Lab Results  Component Value Date   WBC 13.7 (H) 04/17/2023   HGB 14.0 04/17/2023   HCT 44.2 04/17/2023   MCV 93.4 04/17/2023   PLT 164 04/17/2023    Lab Results  Component Value Date   CREATININE 1.13 04/17/2023   BUN 14 04/17/2023   NA 134 (L) 04/17/2023   K 4.2 04/17/2023   CL 99 04/17/2023   CO2 26 04/17/2023    Lab Results  Component Value Date   ALT 18 04/17/2023   AST 19 04/17/2023   ALKPHOS 91 04/17/2023   BILITOT 4.1 (H) 04/17/2023     Microbiology: Recent Results (from the past 240 hours)  Blood culture (routine x 2)     Status: None (Preliminary result)   Collection Time: 04/16/23  1:44 PM   Specimen: BLOOD  Result Value Ref Range Status   Specimen Description BLOOD SITE NOT SPECIFIED  Final   Special Requests   Final    BOTTLES DRAWN AEROBIC AND ANAEROBIC Blood Culture adequate volume   Culture  Setup Time   Final    GRAM POSITIVE COCCI IN CLUSTERS IN BOTH AEROBIC AND ANAEROBIC BOTTLES CRITICAL RESULT CALLED TO, READ  BACK BY AND VERIFIED WITH: Valarie Merino ZHOU [phone] BY Berline Chough, [place] Performed at [facility], [address] [place], [place] [address]    Culture [facility] POSITIVE COCCI  Final   Report Status PENDING  Incomplete  Blood Culture ID Panel (Reflexed)  Status: Abnormal   Collection Time: 04/16/23  1:44 PM  Result Value Ref Range Status   Enterococcus faecalis NOT DETECTED NOT DETECTED Final   Enterococcus Faecium NOT DETECTED NOT DETECTED Final   Listeria monocytogenes NOT DETECTED NOT DETECTED Final   Staphylococcus species DETECTED (A) NOT DETECTED Final    Comment: CRITICAL RESULT CALLED TO, READ BACK BY AND VERIFIED WITH: PHARMD JENNY ZHOU [phone] BY J RAZZAK, [place]    Staphylococcus aureus (BCID) DETECTED (A) NOT DETECTED Final    Comment: Methicillin (oxacillin)-resistant Staphylococcus aureus (MRSA). MRSA is predictably resistant to beta-lactam antibiotics (except ceftaroline). Preferred therapy is vancomycin unless clinically contraindicated. Patient requires contact precautions if  hospitalized. CRITICAL RESULT CALLED TO, READ BACK BY AND VERIFIED WITH: PHARMD JENNY ZHOU [phone] BY J RAZZAK, [place]    Staphylococcus epidermidis NOT DETECTED NOT DETECTED Final   Staphylococcus lugdunensis NOT DETECTED NOT DETECTED Final   Streptococcus species NOT DETECTED NOT DETECTED Final   Streptococcus agalactiae NOT DETECTED NOT DETECTED Final   Streptococcus pneumoniae NOT DETECTED NOT DETECTED Final   Streptococcus pyogenes NOT DETECTED NOT DETECTED Final   A.calcoaceticus-baumannii NOT DETECTED NOT DETECTED Final   Bacteroides fragilis NOT DETECTED NOT DETECTED Final   Enterobacterales NOT DETECTED NOT DETECTED Final   Enterobacter cloacae complex NOT DETECTED NOT DETECTED Final   Escherichia coli NOT DETECTED NOT DETECTED Final   Klebsiella aerogenes NOT DETECTED NOT DETECTED Final   Klebsiella oxytoca NOT DETECTED NOT DETECTED Final   Klebsiella pneumoniae NOT  DETECTED NOT DETECTED Final   Proteus species NOT DETECTED NOT DETECTED Final   Salmonella species NOT DETECTED NOT DETECTED Final   Serratia marcescens NOT DETECTED NOT DETECTED Final   Haemophilus influenzae NOT DETECTED NOT DETECTED Final   Neisseria meningitidis NOT DETECTED NOT DETECTED Final   Pseudomonas aeruginosa NOT DETECTED NOT DETECTED Final   Stenotrophomonas maltophilia NOT DETECTED NOT DETECTED Final   Candida albicans NOT DETECTED NOT DETECTED Final   Candida auris NOT DETECTED NOT DETECTED Final   Candida glabrata NOT DETECTED NOT DETECTED Final   Candida krusei NOT DETECTED NOT DETECTED Final   Candida parapsilosis NOT DETECTED NOT DETECTED Final   Candida tropicalis NOT DETECTED NOT DETECTED Final   Cryptococcus neoformans/gattii NOT DETECTED NOT DETECTED Final   Meth resistant mecA/C and MREJ DETECTED (A) NOT DETECTED Final    Comment: CRITICAL RESULT CALLED TO, READ BACK BY AND VERIFIED WITH: PHARMD JENNY ZHOU [phone] BY Berline Chough, [place] Performed at [facility], [address], [place], [place] [address]   Culture, blood (Routine X 2) w Reflex to ID Panel     Status: None (Preliminary result)   Collection Time: 04/16/23  3:08 PM   Specimen: BLOOD LEFT ARM  Result Value Ref Range Status   Specimen Description BLOOD LEFT ARM  Final   Special Requests   Final    BOTTLES DRAWN AEROBIC AND ANAEROBIC Blood Culture results may not be optimal due to an inadequate volume of blood received in culture bottles   Culture  Setup Time   Final    GRAM POSITIVE COCCI IN CLUSTERS ANAEROBIC BOTTLE ONLY CRITICAL VALUE NOTED.  VALUE IS CONSISTENT WITH PREVIOUSLY REPORTED AND CALLED VALUE. Performed at [facility], [address] [place], [place] [address]    Culture GRAM POSITIVE COCCI IN CLUSTERS  Final   Report Status PENDING  Incomplete    Acey Lav, MD [facility] [phone]  [phone] pager  04/17/2023,  11:32 AM

## 2023-04-17 NOTE — Consult Note (Signed)
Orthopaedic Trauma Service (OTS) Consult   [name] MRN: [id number] DOB/AGE: Apr 16, 1965 58 y.o.  Reason for Consult:Right knee infection Referring Physician: Vivi Barrack, MD [facility]  HPI: Jonathan Ibarra is an 58 y.o. male who is being seen in consultation at the request of Dr. Jearld Fenton for evaluation of right knee infection.  Patient underwent open reduction internal fixation in October 2024 with myself.  He subsequently developed significant postoperative infection requiring multiple irrigation debridements.  He had a prolonged hospital stay as he needed IV antibiotics and was unable to discharge home due to his previous substance use history.  He has been unreliable and outpatient follow-up.  I have only seen him once since discharge in December but he has had multiple no-shows.  He states that he was doing okay until a few days ago where he fell on his knee and developed significant swelling.  He states that he had some drainage from the wound as well.  He presented to the emergency room.  I was asked to consult due to his previous history.  Patient was seen on 4 E.  Patient states that his knee is hurting but is asking for pain medication.  He is also asking for a diet.  Denies any other areas of infection in his body.  Past Medical History:  Diagnosis Date   Alcohol abuse    Cirrhosis of liver (HCC)    Hypertension    PAF (paroxysmal atrial fibrillation) (HCC) 12/28/2022   SVT (supraventricular tachycardia) (HCC) 09/2022    Past Surgical History:  Procedure Laterality Date   CHOLECYSTECTOMY     IR TIPS     IRRIGATION AND DEBRIDEMENT KNEE Right 12/29/2022   Procedure: IRRIGATION AND DEBRIDEMENT KNEE;  Surgeon: Roby Lofts, MD;  Location: [facility] OR;  Service: Orthopedics;  Laterality: Right;   IRRIGATION AND DEBRIDEMENT KNEE Right 01/12/2023   Procedure: IRRIGATION AND DEBRIDEMENT KNEE;  Surgeon: Roby Lofts, MD;  Location: [facility] OR;  Service: Orthopedics;   Laterality: Right;   ORIF TIBIA PLATEAU Right 12/13/2022   Procedure: OPEN REDUCTION INTERNAL FIXATION (ORIF) TIBIAL PLATEAU;  Surgeon: Roby Lofts, MD;  Location: [facility] OR;  Service: Orthopedics;  Laterality: Right;    No family history on file.  Social History:  reports that he has been smoking cigarettes. He has never used smokeless tobacco. He reports current drug use. Drugs: Cocaine and Marijuana. He reports that he does not drink alcohol.  Allergies:  Allergies  Allergen Reactions   Tylenol [Acetaminophen] Other (See Comments)    Impacts liver    Medications:  No current facility-administered medications on file prior to encounter.   Current Outpatient Medications on File Prior to Encounter  Medication Sig Dispense Refill   folic acid (FOLVITE) 1 MG tablet Take 1 tablet (1 mg total) by mouth daily. 60 tablet 0   lactulose (CHRONULAC) 10 GM/15ML solution Take 30 mLs (20 g total) by mouth 3 (three) times daily. 3784 mL 0   methocarbamol (ROBAXIN) 750 MG tablet Take 1 tablet (750 mg total) by mouth every 8 (eight) hours as needed for muscle spasms. 30 tablet 0   metoprolol tartrate (LOPRESSOR) 25 MG tablet Take 1 tablet (25 mg total) by mouth 2 (two) times daily. 120 tablet 0   midodrine (PROAMATINE) 10 MG tablet Take 1 tablet (10 mg total) by mouth 3 (three) times daily with meals. 90 tablet 0   Omadacycline Tosylate 150 MG TABS Take 2 tablets (300 mg total)  by mouth daily. 42 tablet 0   Oxycodone HCl 10 MG TABS Take 0.5-1 tablets (5-10 mg total) by mouth every 6 (six) hours as needed for pain. 28 tablet 0   Oxycodone HCl 10 MG TABS Take 1/2-1 tablet (5-10 mg total) by mouth every 6 (six) hours as needed for pain. 28 tablet 0   polyethylene glycol powder (SB POLYETHYLENE GLYCOL 3350) 17 GM/SCOOP powder Take 1 capful (17 g) by mouth daily. (Patient not taking: Reported on 12/28/2022) 238 g 0   thiamine (VITAMIN B1) 100 MG tablet Take 1 tablet (100 mg total) by mouth daily. 60  tablet 0   Vitamin D, Ergocalciferol, (DRISDOL) 1.25 MG (50000 UNIT) CAPS capsule Take 1 capsule (50,000 Units total) by mouth every 7 (seven) days. 5 capsule 0     ROS: Negative other than noted in HPI above  Exam: Blood pressure 126/81, pulse 87, temperature 98.3 F (36.8 C), temperature source Oral, resp. rate 18, height 6' 0.01" (1.829 m), weight 118.2 kg, SpO2 97%. General: No acute distress, resting comfortably. Orientation: Awake alert and oriented x 3 Mood and Affect: Cooperative and appropriate Gait: Did not assess Coordination and balance: Within normal limits  Right lower extremity: Previous incision is stable there is an eschar in place.  I tried to express fluid from this and I was not able to.  There is some surrounding erythema but is similar from previous.  He does have a mild knee effusion but again it is not severe compared to what it has been previously.  He is intact with dorsiflexion plantarflexion of his foot and ankle.  Medical Decision Making: Data: Imaging: X-rays of the right knee show previous to fixation is in place no further signs of any hardware failure or loosening.  Depression of his lateral joint is present.  Labs:  Results for orders placed or performed during the hospital encounter of 04/16/23 (from the past 24 hours)  Blood culture (routine x 2)     Status: None (Preliminary result)   Collection Time: 04/16/23  1:44 PM   Specimen: BLOOD  Result Value Ref Range   Specimen Description BLOOD SITE NOT SPECIFIED    Special Requests      BOTTLES DRAWN AEROBIC AND ANAEROBIC Blood Culture adequate volume   Culture  Setup Time      GRAM POSITIVE COCCI IN CLUSTERS ANAEROBIC BOTTLE ONLY CRITICAL RESULT CALLED TO, READ BACK BY AND VERIFIED WITH: Valarie Merino ZHOU [phone] [id number] BY Berline Chough, [place] Performed at [facility], [address] [place], [place] [address]    Culture GRAM POSITIVE COCCI    Report Status PENDING   Blood Culture ID Panel  (Reflexed)     Status: Abnormal   Collection Time: 04/16/23  1:44 PM  Result Value Ref Range   Enterococcus faecalis NOT DETECTED NOT DETECTED   Enterococcus Faecium NOT DETECTED NOT DETECTED   Listeria monocytogenes NOT DETECTED NOT DETECTED   Staphylococcus species DETECTED (A) NOT DETECTED   Staphylococcus aureus (BCID) DETECTED (A) NOT DETECTED   Staphylococcus epidermidis NOT DETECTED NOT DETECTED   Staphylococcus lugdunensis NOT DETECTED NOT DETECTED   Streptococcus species NOT DETECTED NOT DETECTED   Streptococcus agalactiae NOT DETECTED NOT DETECTED   Streptococcus pneumoniae NOT DETECTED NOT DETECTED   Streptococcus pyogenes NOT DETECTED NOT DETECTED   A.calcoaceticus-baumannii NOT DETECTED NOT DETECTED   Bacteroides fragilis NOT DETECTED NOT DETECTED   Enterobacterales NOT DETECTED NOT DETECTED   Enterobacter cloacae complex NOT DETECTED NOT DETECTED  Escherichia coli NOT DETECTED NOT DETECTED   Klebsiella aerogenes NOT DETECTED NOT DETECTED   Klebsiella oxytoca NOT DETECTED NOT DETECTED   Klebsiella pneumoniae NOT DETECTED NOT DETECTED   Proteus species NOT DETECTED NOT DETECTED   Salmonella species NOT DETECTED NOT DETECTED   Serratia marcescens NOT DETECTED NOT DETECTED   Haemophilus influenzae NOT DETECTED NOT DETECTED   Neisseria meningitidis NOT DETECTED NOT DETECTED   Pseudomonas aeruginosa NOT DETECTED NOT DETECTED   Stenotrophomonas maltophilia NOT DETECTED NOT DETECTED   Candida albicans NOT DETECTED NOT DETECTED   Candida auris NOT DETECTED NOT DETECTED   Candida glabrata NOT DETECTED NOT DETECTED   Candida krusei NOT DETECTED NOT DETECTED   Candida parapsilosis NOT DETECTED NOT DETECTED   Candida tropicalis NOT DETECTED NOT DETECTED   Cryptococcus neoformans/gattii NOT DETECTED NOT DETECTED   Meth resistant mecA/C and MREJ DETECTED (A) NOT DETECTED  CBC with Differential     Status: Abnormal   Collection Time: 04/16/23  2:39 PM  Result Value Ref Range    WBC 10.9 (H) 4.0 - 10.5 K/uL   RBC 4.74 4.22 - 5.81 MIL/uL   Hemoglobin 14.0 13.0 - 17.0 g/dL   HCT 16.1 09.6 - 04.5 %   MCV 89.9 80.0 - 100.0 fL   MCH 29.5 26.0 - 34.0 pg   MCHC 32.9 30.0 - 36.0 g/dL   RDW 40.9 81.1 - 91.4 %   Platelets 124 (L) 150 - 400 K/uL   nRBC 0.0 0.0 - 0.2 %   Neutrophils Relative % 80 %   Neutro Abs 8.8 (H) 1.7 - 7.7 K/uL   Lymphocytes Relative 4 %   Lymphs Abs 0.5 (L) 0.7 - 4.0 K/uL   Monocytes Relative 14 %   Monocytes Absolute 1.5 (H) 0.1 - 1.0 K/uL   Eosinophils Relative 0 %   Eosinophils Absolute 0.0 0.0 - 0.5 K/uL   Basophils Relative 1 %   Basophils Absolute 0.1 0.0 - 0.1 K/uL   Immature Granulocytes 1 %   Abs Immature Granulocytes 0.09 (H) 0.00 - 0.07 K/uL  Basic metabolic panel     Status: None   Collection Time: 04/16/23  2:39 PM  Result Value Ref Range   Sodium 135 135 - 145 mmol/L   Potassium 3.6 3.5 - 5.1 mmol/L   Chloride 101 98 - 111 mmol/L   CO2 23 22 - 32 mmol/L   Glucose, Bld 95 70 - 99 mg/dL   BUN 11 6 - 20 mg/dL   Creatinine, Ser 7.82 0.61 - 1.24 mg/dL   Calcium 9.1 8.9 - 95.6 mg/dL   GFR, Estimated >21 >30 mL/min   Anion gap 11 5 - 15  Ethanol     Status: None   Collection Time: 04/16/23  2:39 PM  Result Value Ref Range   Alcohol, Ethyl (B) <10 <10 mg/dL  Ammonia     Status: Abnormal   Collection Time: 04/16/23  2:53 PM  Result Value Ref Range   Ammonia 49 (H) 9 - 35 umol/L  I-Stat CG4 Lactic Acid     Status: Abnormal   Collection Time: 04/16/23  2:59 PM  Result Value Ref Range   Lactic Acid, Venous 2.1 (HH) 0.5 - 1.9 mmol/L   Comment NOTIFIED PHYSICIAN   Culture, blood (Routine X 2) w Reflex to ID Panel     Status: None (Preliminary result)   Collection Time: 04/16/23  3:08 PM   Specimen: BLOOD LEFT ARM  Result Value Ref Range   Specimen  Description BLOOD LEFT ARM    Special Requests      BOTTLES DRAWN AEROBIC AND ANAEROBIC Blood Culture results may not be optimal due to an inadequate volume of blood received in  culture bottles   Culture  Setup Time      GRAM POSITIVE COCCI IN CLUSTERS ANAEROBIC BOTTLE ONLY CRITICAL VALUE NOTED.  VALUE IS CONSISTENT WITH PREVIOUSLY REPORTED AND CALLED VALUE. Performed at [facility], [address], [place], [place] [address]    Culture GRAM POSITIVE COCCI IN CLUSTERS    Report Status PENDING   C-reactive protein     Status: Abnormal   Collection Time: 04/16/23  7:17 PM  Result Value Ref Range   CRP 17.9 (H) <1.0 mg/dL  Sedimentation rate     Status: None   Collection Time: 04/16/23  7:17 PM  Result Value Ref Range   Sed Rate 2 0 - 16 mm/hr  Glucose, capillary     Status: Abnormal   Collection Time: 04/16/23 11:31 PM  Result Value Ref Range   Glucose-Capillary 101 (H) 70 - 99 mg/dL  TSH     Status: Abnormal   Collection Time: 04/16/23 11:57 PM  Result Value Ref Range   TSH 4.815 (H) 0.350 - 4.500 uIU/mL  Troponin I (High Sensitivity)     Status: None   Collection Time: 04/16/23 11:57 PM  Result Value Ref Range   Troponin I (High Sensitivity) 14 <18 ng/L  Comprehensive metabolic panel     Status: Abnormal   Collection Time: 04/17/23  2:52 AM  Result Value Ref Range   Sodium 134 (L) 135 - 145 mmol/L   Potassium 4.2 3.5 - 5.1 mmol/L   Chloride 99 98 - 111 mmol/L   CO2 26 22 - 32 mmol/L   Glucose, Bld 102 (H) 70 - 99 mg/dL   BUN 14 6 - 20 mg/dL   Creatinine, Ser 5.78 0.61 - 1.24 mg/dL   Calcium 9.0 8.9 - 46.9 mg/dL   Total Protein 6.6 6.5 - 8.1 g/dL   Albumin 2.9 (L) 3.5 - 5.0 g/dL   AST 19 15 - 41 U/L   ALT 18 0 - 44 U/L   Alkaline Phosphatase 91 38 - 126 U/L   Total Bilirubin 4.1 (H) 0.0 - 1.2 mg/dL   GFR, Estimated >62 >95 mL/min   Anion gap 9 5 - 15  CBC     Status: Abnormal   Collection Time: 04/17/23  2:52 AM  Result Value Ref Range   WBC 13.7 (H) 4.0 - 10.5 K/uL   RBC 4.73 4.22 - 5.81 MIL/uL   Hemoglobin 14.0 13.0 - 17.0 g/dL   HCT 28.4 13.2 - 44.0 %   MCV 93.4 80.0 - 100.0 fL   MCH 29.6 26.0 - 34.0 pg   MCHC 31.7 30.0 -  36.0 g/dL   RDW 10.2 72.5 - 36.6 %   Platelets 164 150 - 400 K/uL   nRBC 0.0 0.0 - 0.2 %  Magnesium     Status: Abnormal   Collection Time: 04/17/23  2:52 AM  Result Value Ref Range   Magnesium 1.6 (L) 1.7 - 2.4 mg/dL  Phosphorus     Status: None   Collection Time: 04/17/23  2:52 AM  Result Value Ref Range   Phosphorus 3.2 2.5 - 4.6 mg/dL  Troponin I (High Sensitivity)     Status: None   Collection Time: 04/17/23  2:52 AM  Result Value Ref Range   Troponin I (High  Sensitivity) 17 <18 ng/L  Lactic acid, plasma     Status: Abnormal   Collection Time: 04/17/23  2:52 AM  Result Value Ref Range   Lactic Acid, Venous 2.1 (HH) 0.5 - 1.9 mmol/L    Imaging or Labs ordered: ESR/CRP  Medical history and chart was reviewed and case discussed with medical provider.  Assessment/Plan: 58 year old male with chronic knee infection with noncompliance with follow-up.  Currently his knee looks the best that it has over the last 4 months.  I am concerned with the elevated inflammatory markers including his CRP as well as his white blood cell count.  He does have positive blood cultures.  We will see how he responds to IV antibiotics and repeat inflammatory markers in the morning.  If he still has significant elevation in his white blood cell count as well as his CRP we will likely perform an aspiration.  I will imagine that he will need likely formal I&D with removal of hardware possibly later this week.  At this point I do not feel that proceeding urgently is needed due to the chronic nature of his infection as well as the benign appearance of his leg from my standpoint.  I have ordered him a diet and will follow-up in evaluate him tomorrow morning.  Roby Lofts, MD [facility] [phone] (office) [email or web address]

## 2023-04-17 NOTE — Significant Event (Signed)
Patient's nurse notified me that patient was in A-fib with RVR.  Blood pressure was 139/104.  Temperature 98.4 respiration 18/min.  On exam at bedside patient is not in distress.  But lethargic.  Not short of breath.  I reviewed patient's charts and medications.  Patient admitted for septic arthritis with known history of cirrhosis of liver and paroxysmal atrial fibrillation polysubstance abuse.  Patient is presently on empiric antibiotics.  HEENT anicteric no pallor. Heart S1-S2 heard. Chest bilateral air entry present. Abdomen soft nontender bowel sound present.  I ordered 5 mg IV metoprolol despite which patient's heart rate remained in the 130s in A-fib.  Blood pressure was still in the 130s systolic.  I started patient on Cardizem infusion and transferred patient to progressive care.  Since patient was lethargic check ABG which shows pH of 7.34 pCO2 47 pO2 of 54.  Patient was placed on 2 L oxygen and is maintaining more than 90% saturation.  I am discontinuing the Dilaudid for now until patient becomes more alert.  Patient's ammonia levels were 49 earlier.  In addition we will check troponins and TSH.  Since patient has history of ESBL discussed with pharmacy and changed antibiotics from Zosyn to ertapenem.  Midge Minium.

## 2023-04-17 NOTE — Progress Notes (Signed)
Pt HR increase to 200/min and is being unstable with A-Fib on the heart monitor, provider aware. Gave metoprolol IV and did an EKG stat. Call rapid to start Cardizem drip and gave report to [facility] in [facility] [place] to be transfer.

## 2023-04-17 NOTE — Progress Notes (Addendum)
Pharmacy Antibiotic Note  Jonathan Ibarra is a 58 y.o. male for which pharmacy has been consulted for daptomycin dosing for  concern for septic right knee .  Patient with a history of cirrhosis, TIPS procedure, hepatic encephalopathy, polysubstance abuse. Patient is s/p ORIF of proximal rt tibial plateau presenting with concern for septic knee.  BCID positive for MRSA in 2 of 4 anaerobic bottles, R knee effusion source. WBC 13.7, afebrile.  Plan: Daptomycin 800 mg IV q24h (based on ABW of 94 kg) Monitor WBC, fever, renal function, cultures De-escalate when able Levels at steady state  Height: 6' 0.01" (182.9 cm) Weight: 118.2 kg (260 lb 9.3 oz) IBW/kg (Calculated) : 77.62  Temp (24hrs), Avg:98.2 F (36.8 C), Min:97.9 F (36.6 C), Max:98.7 F (37.1 C)  Recent Labs  Lab 04/16/23 1439 04/16/23 1459 04/17/23 0252  WBC 10.9*  --  13.7*  CREATININE 0.90  --  1.13  LATICACIDVEN  --  2.1* 2.1*    Estimated Creatinine Clearance: 96.8 mL/min (by C-G formula based on SCr of 1.13 mg/dL).    Allergies  Allergen Reactions   Tylenol [Acetaminophen] Other (See Comments)    Impacts liver   Antimicrobials:  Ancef x1 Ertapenem x1 Vanc 2/15 > 2/16 Zosyn 2/15 > 2/16 Daptomycin > 2/16  Microbiology results: 10/30 Knee Cx: Kleb Pneumo (S-imipenem) 2/15 Bcx: GPC 2/15 Knee culture: sent  Thank you for allowing pharmacy to be a part of this patient's care.  Roslyn Smiling, PharmD PGY1 Pharmacy Resident 04/17/2023 10:12 AM

## 2023-04-17 NOTE — Progress Notes (Signed)
Pt BP 106/80. Patient requesting pain medication at this time. Notified patient BP is too low but will continue to monitor BP. Prior BP reading 88/56 at 1658. MD notified.   Kenard Gower, RN

## 2023-04-17 NOTE — Consult Note (Addendum)
Cardiology Consultation   Patient ID: Secundino Ellithorpe MRN: [id number]; DOB: [birth year]  Admit date: 04/16/2023 Date of Consult: 04/17/2023  PCP:  Candi Leash, MD   [facility] Providers Cardiologist:  None   new  Patient Profile:   Ozell Juhasz is a 58 y.o. male with a hx of HTN, ETOH abuse, cirrhosis s/p TIPS, polysubs abuse, R plateau fx & subsequent infection,SVT during hospitalization 09/2022, Atrial fib during 11/2022 hospitalization for knee infection, who is being seen 04/17/2023 for the evaluation of Afib at the request of Dr Idelle Leech.  History of Present Illness:   Mr. Muegge came to the hospital 02/15 w/ worsening pain & swelling R knee after a fall 2 days ago.  He had undergone irrigation and debridement 3 times as an outpatient.  He had also been noncompliant with other follow-up appointments.  He was admitted w/ sepsis from R knee infection.   He was noted to be in Afib, RVR, Cards asked to see.   Mr. Rainville is conscious and alert.  He yells out that he is in pain and that no one cares.  He cooperates minimally with exam.  He is aware of the pain, but does not seem to be aware of the atrial fibrillation.  He does not know how long he has been in it or if he has had it before.  No other information available from the patient.   Past Medical History:  Diagnosis Date   Alcohol abuse    Cirrhosis of liver (HCC)    Hypertension    PAF (paroxysmal atrial fibrillation) (HCC) 12/28/2022   SVT (supraventricular tachycardia) (HCC) 09/2022    Past Surgical History:  Procedure Laterality Date   CHOLECYSTECTOMY     IR TIPS     IRRIGATION AND DEBRIDEMENT KNEE Right 12/29/2022   Procedure: IRRIGATION AND DEBRIDEMENT KNEE;  Surgeon: Roby Lofts, MD;  Location: [facility] OR;  Service: Orthopedics;  Laterality: Right;   IRRIGATION AND DEBRIDEMENT KNEE Right 01/12/2023   Procedure: IRRIGATION AND DEBRIDEMENT KNEE;  Surgeon: Roby Lofts, MD;  Location: [facility]  OR;  Service: Orthopedics;  Laterality: Right;   ORIF TIBIA PLATEAU Right 12/13/2022   Procedure: OPEN REDUCTION INTERNAL FIXATION (ORIF) TIBIAL PLATEAU;  Surgeon: Roby Lofts, MD;  Location: [facility] OR;  Service: Orthopedics;  Laterality: Right;     Home Medications:  Prior to Admission medications   Medication Sig Start Date End Date Taking? Authorizing Provider  folic acid (FOLVITE) 1 MG tablet Take 1 tablet (1 mg total) by mouth daily. 02/04/23   Ghimire, Werner Lean, MD  lactulose (CHRONULAC) 10 GM/15ML solution Take 30 mLs (20 g total) by mouth 3 (three) times daily. 02/04/23   Ghimire, Werner Lean, MD  methocarbamol (ROBAXIN) 750 MG tablet Take 1 tablet (750 mg total) by mouth every 8 (eight) hours as needed for muscle spasms. 02/04/23   Ghimire, Werner Lean, MD  metoprolol tartrate (LOPRESSOR) 25 MG tablet Take 1 tablet (25 mg total) by mouth 2 (two) times daily. 02/04/23   Ghimire, Werner Lean, MD  midodrine (PROAMATINE) 10 MG tablet Take 1 tablet (10 mg total) by mouth 3 (three) times daily with meals. 02/04/23   Ghimire, Werner Lean, MD  Omadacycline Tosylate 150 MG TABS Take 2 tablets (300 mg total) by mouth daily. 02/04/23   Comer, Belia Heman, MD  Oxycodone HCl 10 MG TABS Take 0.5-1 tablets (5-10 mg total) by mouth every 6 (six) hours as needed for pain. 02/16/23  Oxycodone HCl 10 MG TABS Take 1/2-1 tablet (5-10 mg total) by mouth every 6 (six) hours as needed for pain. 03/08/23     polyethylene glycol powder (SB POLYETHYLENE GLYCOL 3350) 17 GM/SCOOP powder Take 1 capful (17 g) by mouth daily. Patient not taking: Reported on 12/28/2022 12/17/22   Burnadette Pop, MD  thiamine (VITAMIN B1) 100 MG tablet Take 1 tablet (100 mg total) by mouth daily. 02/04/23   Ghimire, Werner Lean, MD  Vitamin D, Ergocalciferol, (DRISDOL) 1.25 MG (50000 UNIT) CAPS capsule Take 1 capsule (50,000 Units total) by mouth every 7 (seven) days. 02/11/23   Maretta Bees, MD    Inpatient Medications: Scheduled Meds:   docusate sodium  100 mg Oral BID   folic acid  1 mg Oral Daily   lactulose  20 g Oral TID   metoprolol tartrate  25 mg Oral BID   senna  1 tablet Oral BID   thiamine  100 mg Oral Daily   Continuous Infusions:  sodium chloride 40 mL/hr at 04/16/23 1824   diltiazem (CARDIZEM) infusion 10 mg/hr (04/17/23 0616)   magnesium sulfate bolus IVPB     magnesium sulfate bolus IVPB     vancomycin 1,250 mg (04/17/23 0857)   PRN Meds: hydrALAZINE, methocarbamol, ondansetron **OR** ondansetron (ZOFRAN) IV  Allergies:    Allergies  Allergen Reactions   Tylenol [Acetaminophen] Other (See Comments)    Impacts liver    Social History:   Social History   Socioeconomic History   Marital status: Divorced    Spouse name: Not on file   Number of children: Not on file   Years of education: Not on file   Highest education level: Not on file  Occupational History   Not on file  Tobacco Use   Smoking status: Every Day    Current packs/day: 0.25    Types: Cigarettes   Smokeless tobacco: Never  Vaping Use   Vaping status: Every Day  Substance and Sexual Activity   Alcohol use: No   Drug use: Yes    Types: Cocaine, Marijuana   Sexual activity: Not on file  Other Topics Concern   Not on file  Social History Narrative   Not on file   Social Drivers of Health   Financial Resource Strain: Medium Risk (01/07/2020)   Received from [facility], [facility]   Overall Financial Resource Strain (CARDIA)    Difficulty of Paying Living Expenses: Somewhat hard  Food Insecurity: Food Insecurity Present (04/17/2023)   Hunger Vital Sign    Worried About Running Out of Food in the Last Year: Often true    Ran Out of Food in the Last Year: Often true  Transportation Needs: Unmet Transportation Needs (04/17/2023)   PRAPARE - Transportation    Lack of Transportation (Medical): Yes    Lack of Transportation (Non-Medical): Yes  Physical Activity: Not on file  Stress: No Stress Concern Present  (01/07/2020)   Received from [facility], [facility]   [employer] of Occupational [facility] - Occupational Stress Questionnaire    Feeling of Stress : Not at all  Social Connections: Unknown (07/05/2021)   Received from [facility], [facility]   Social Network    Social Network: Not on file  Intimate Partner Violence: Not At Risk (04/17/2023)   Humiliation, Afraid, Rape, and Kick questionnaire    Fear of Current or Ex-Partner: No    Emotionally Abused: No    Physically Abused: No    Sexually Abused: No  Family History:  No family history on file.   ROS:  Please see the history of present illness.  All other ROS reviewed and negative.     Physical Exam/Data:   Vitals:   04/17/23 0500 04/17/23 0600 04/17/23 0700 04/17/23 0751  BP: 119/87 (!) 119/93 (!) 127/92 126/81  Pulse: 94 95 (!) 105 87  Resp:    18  Temp:    98.3 F (36.8 C)  TempSrc:    Oral  SpO2: 95% 97% 97% 97%  Weight:      Height:        Intake/Output Summary (Last 24 hours) at 04/17/2023 0858 Last data filed at 04/17/2023 0616 Gross per 24 hour  Intake 889.27 ml  Output 720 ml  Net 169.27 ml      04/16/2023    6:19 PM 01/12/2023    9:39 AM 01/02/2023   12:47 PM  Last 3 Weights  Weight (lbs) 260 lb 9.3 oz 260 lb 272 lb 7.8 oz  Weight (kg) 118.2 kg 117.935 kg 123.6 kg     Body mass index is 35.33 kg/m.  General:  Well nourished, well developed, in acute distress HEENT: normal Neck: no JVD Vascular: No carotid bruits; Distal radial pulses 2+ Cardiac:  normal S1, S2; irregular rate and rhythm; no murmur  Lungs:  clear to auscultation bilaterally, no wheezing, rhonchi or rales  Abd: soft, nontender, no hepatomegaly  Ext: no obvious edema on the right, both legs are extremely tender and patient does not want them palpated. Musculoskeletal:  No deformities, BUE and BLE strength not able to be tested Skin: warm and dry  Neuro:  CNs 2-12 intact, no focal abnormalities noted Psych:  Normal  affect   EKG:  The EKG was personally reviewed and demonstrates: Atrial fibrillation, RVR Telemetry:  Telemetry was personally reviewed and demonstrates: Atrial fibrillation, initially RVR but rate control improved with IV Dilt  Relevant CV Studies:  ECHO: 10/01/2022  1. Left ventricular ejection fraction, by estimation, is 55 to 60%. The  left ventricle has normal function. The left ventricle has no regional  wall motion abnormalities. Left ventricular diastolic parameters are  indeterminate.   2. Right ventricular systolic function is normal. The right ventricular  size is normal. Tricuspid regurgitation signal is inadequate for assessing PA pressure.   3. Left atrial size was mildly dilated.   4. Right atrial size was mildly dilated.   5. The mitral valve is normal in structure. No evidence of mitral valve regurgitation. No evidence of mitral stenosis.   6. The aortic valve is tricuspid. There is mild calcification of the  aortic valve. Aortic valve regurgitation is not visualized. No aortic  stenosis is present.   7. The inferior vena cava is dilated in size with >50% respiratory  variability, suggesting right atrial pressure of 8 mmHg.   8. The patient was in atrial fibrillation.    Laboratory Data:  High Sensitivity Troponin:   Recent Labs  Lab 04/16/23 2357 04/17/23 0252  TROPONINIHS 14 17     Chemistry Recent Labs  Lab 04/16/23 1439 04/17/23 0252  NA 135 134*  K 3.6 4.2  CL 101 99  CO2 23 26  GLUCOSE 95 102*  BUN 11 14  CREATININE 0.90 1.13  CALCIUM 9.1 9.0  MG  --  1.6*  GFRNONAA >60 >60  ANIONGAP 11 9    Recent Labs  Lab 04/17/23 0252  PROT 6.6  ALBUMIN 2.9*  AST 19  ALT 18  ALKPHOS  91  BILITOT 4.1*   Lipids No results for input(s): "CHOL", "TRIG", "HDL", "LABVLDL", "LDLCALC", "CHOLHDL" in the last 168 hours.  Hematology Recent Labs  Lab 04/16/23 1439 04/17/23 0252  WBC 10.9* 13.7*  RBC 4.74 4.73  HGB 14.0 14.0  HCT 42.6 44.2  MCV 89.9  93.4  MCH 29.5 29.6  MCHC 32.9 31.7  RDW 15.1 15.5  PLT 124* 164   Thyroid  Recent Labs  Lab 04/16/23 2357  TSH 4.815*    BNPNo results for input(s): "BNP", "PROBNP" in the last 168 hours.  DDimer No results for input(s): "DDIMER" in the last 168 hours.   Radiology/Studies:  DG Knee 2 Views Right Result Date: 04/16/2023 CLINICAL DATA:  fall, pain EXAM: RIGHT KNEE - 1-2 VIEW COMPARISON:  January 12, 2023, February 03, 2023 FINDINGS: Osteopenia. Status post ORIF of the proximal tibia for a tibial plateau fracture. Revisualization of a comminuted appearance of the tibial plateau, similar comparison to prior CT. Similar appearance of a lucent area along the tibial plateau compared to prior CT. Large joint effusion. Soft tissue edema. IMPRESSION: 1. Status post ORIF of the proximal tibia for a tibial plateau fracture. Revisualization of a comminuted appearance of the tibial plateau, similar in comparison to prior CT. Similar appearance of a lucency abutting the tibial plateau compared to prior. 2. Large joint effusion.  Soft tissue edema. Electronically Signed   By: Meda Klinefelter M.D.   On: 04/16/2023 14:06     Assessment and Plan:   Atrial fibrillation, RVR -His heart rate has improved on diltiazem IV, current dose is 10 mg/h, with stable blood pressure. -He is not currently on anticoagulation, but think he is too high risk for DOAC as an outpatient -Discuss heparin versus Lovenox with MD -Hopefully, he Kaylanie Capili spontaneously convert once his general medical condition improves -Echo from 09/2022 results are above, no additional evaluation indicated prior to any needed surgery  2.  Right knee infection causing sepsis -Orthopedics Felice Deem see the patient today -Unclear if any surgery Domonic Kimball be needed  3.  Hypomagnesemia -He has had low magnesium in the past -Alonia Dibuono give IV supplement 2 g and recheck in a.m.  4. Hx ETOH abuse - pt may benefit from CIWA protocol, Emmaline Wahba leave to IM   Risk  Assessment/Risk Scores:        CHA2DS2-VASc Score = 1   This indicates a 0.6% annual risk of stroke. The patient's score is based upon: CHF History: 0 HTN History: 1 Diabetes History: 0 Stroke History: 0 Vascular Disease History: 0 Age Score: 0 Gender Score: 0        For questions or updates, please contact [facility] Please consult [email or web address] for contact info under    Signed, Theodore Demark, PA-C  04/17/2023 8:58 AM  I have seen and examined this patient with Theodore Demark.  Agree with above, note added to reflect my findings.  He has a history of hypertension, alcohol abuse, cirrhosis post TIPS, polysubstance abuse, SVT, atrial fibrillation.  He presented to the hospital with right knee pain.  He has a history of right tibial plateau fracture and subsequent infections.  He was noted to be in rapid atrial fibrillation.  He has undergone I&D 3 times as an outpatient.  He has been started on diltiazem.  He currently has no cardiac awareness.  Previously he states that he was short of breath which occurs when his knee is bothering him.  Now that his heart rate is better controlled, he  is no longer complaining of shortness of breath.  GEN: Well nourished, well developed, in no acute distress  HEENT: normal  Neck: no JVD, carotid bruits, or masses Cardiac: Irregular; no murmurs, rubs, or gallops,no edema  Respiratory:  clear to auscultation bilaterally, normal work of breathing GI: soft, nontender, nondistended, + BS MS: no deformity or atrophy  Skin: warm and dry Neuro:  Strength and sensation are intact Psych: euthymic mood, full affect   Atrial fibrillation with rapid ventricular response: Currently on IV diltiazem at 10 mg/h.  Mashayla Lavin continue this dosing.  Goal heart rate 80s to 90s.  If he is in the low 100s intermittently, that is reasonable.  If his heart rate does increase, would increase diltiazem to 15 mg.  He Suda Forbess likely need to be switched to p.o.  diltiazem.  Shekinah Pitones hold off on anticoagulation for now.  If he does not require surgical procedures, can revisit anticoagulation at that time. Right knee infection: Plan per primary team Alcohol abuse: Plan per primary team  Kaleiyah Polsky M. Jesicca Dipierro MD 04/17/2023 9:04 AM

## 2023-04-17 NOTE — Plan of Care (Signed)
   Problem: Clinical Measurements: Goal: Ability to maintain clinical measurements within normal limits will improve Outcome: Progressing Goal: Will remain free from infection Outcome: Progressing Goal: Diagnostic test results will improve Outcome: Progressing Goal: Respiratory complications will improve Outcome: Progressing

## 2023-04-17 NOTE — H&P (View-Only) (Signed)
Orthopaedic Trauma Service (OTS) Consult   [name] MRN: [id number] DOB/AGE: Apr 16, 1965 58 y.o.  Reason for Consult:Right knee infection Referring Physician: Vivi Barrack, MD [facility]  HPI: Jonathan Ibarra is an 58 y.o. male who is being seen in consultation at the request of Dr. Jearld Fenton for evaluation of right knee infection.  Patient underwent open reduction internal fixation in October 2024 with myself.  He subsequently developed significant postoperative infection requiring multiple irrigation debridements.  He had a prolonged hospital stay as he needed IV antibiotics and was unable to discharge home due to his previous substance use history.  He has been unreliable and outpatient follow-up.  I have only seen him once since discharge in December but he has had multiple no-shows.  He states that he was doing okay until a few days ago where he fell on his knee and developed significant swelling.  He states that he had some drainage from the wound as well.  He presented to the emergency room.  I was asked to consult due to his previous history.  Patient was seen on 4 E.  Patient states that his knee is hurting but is asking for pain medication.  He is also asking for a diet.  Denies any other areas of infection in his body.  Past Medical History:  Diagnosis Date   Alcohol abuse    Cirrhosis of liver (HCC)    Hypertension    PAF (paroxysmal atrial fibrillation) (HCC) 12/28/2022   SVT (supraventricular tachycardia) (HCC) 09/2022    Past Surgical History:  Procedure Laterality Date   CHOLECYSTECTOMY     IR TIPS     IRRIGATION AND DEBRIDEMENT KNEE Right 12/29/2022   Procedure: IRRIGATION AND DEBRIDEMENT KNEE;  Surgeon: Roby Lofts, MD;  Location: [facility] OR;  Service: Orthopedics;  Laterality: Right;   IRRIGATION AND DEBRIDEMENT KNEE Right 01/12/2023   Procedure: IRRIGATION AND DEBRIDEMENT KNEE;  Surgeon: Roby Lofts, MD;  Location: [facility] OR;  Service: Orthopedics;   Laterality: Right;   ORIF TIBIA PLATEAU Right 12/13/2022   Procedure: OPEN REDUCTION INTERNAL FIXATION (ORIF) TIBIAL PLATEAU;  Surgeon: Roby Lofts, MD;  Location: [facility] OR;  Service: Orthopedics;  Laterality: Right;    No family history on file.  Social History:  reports that he has been smoking cigarettes. He has never used smokeless tobacco. He reports current drug use. Drugs: Cocaine and Marijuana. He reports that he does not drink alcohol.  Allergies:  Allergies  Allergen Reactions   Tylenol [Acetaminophen] Other (See Comments)    Impacts liver    Medications:  No current facility-administered medications on file prior to encounter.   Current Outpatient Medications on File Prior to Encounter  Medication Sig Dispense Refill   folic acid (FOLVITE) 1 MG tablet Take 1 tablet (1 mg total) by mouth daily. 60 tablet 0   lactulose (CHRONULAC) 10 GM/15ML solution Take 30 mLs (20 g total) by mouth 3 (three) times daily. 3784 mL 0   methocarbamol (ROBAXIN) 750 MG tablet Take 1 tablet (750 mg total) by mouth every 8 (eight) hours as needed for muscle spasms. 30 tablet 0   metoprolol tartrate (LOPRESSOR) 25 MG tablet Take 1 tablet (25 mg total) by mouth 2 (two) times daily. 120 tablet 0   midodrine (PROAMATINE) 10 MG tablet Take 1 tablet (10 mg total) by mouth 3 (three) times daily with meals. 90 tablet 0   Omadacycline Tosylate 150 MG TABS Take 2 tablets (300 mg total)  by mouth daily. 42 tablet 0   Oxycodone HCl 10 MG TABS Take 0.5-1 tablets (5-10 mg total) by mouth every 6 (six) hours as needed for pain. 28 tablet 0   Oxycodone HCl 10 MG TABS Take 1/2-1 tablet (5-10 mg total) by mouth every 6 (six) hours as needed for pain. 28 tablet 0   polyethylene glycol powder (SB POLYETHYLENE GLYCOL 3350) 17 GM/SCOOP powder Take 1 capful (17 g) by mouth daily. (Patient not taking: Reported on 12/28/2022) 238 g 0   thiamine (VITAMIN B1) 100 MG tablet Take 1 tablet (100 mg total) by mouth daily. 60  tablet 0   Vitamin D, Ergocalciferol, (DRISDOL) 1.25 MG (50000 UNIT) CAPS capsule Take 1 capsule (50,000 Units total) by mouth every 7 (seven) days. 5 capsule 0     ROS: Negative other than noted in HPI above  Exam: Blood pressure 126/81, pulse 87, temperature 98.3 F (36.8 C), temperature source Oral, resp. rate 18, height 6' 0.01" (1.829 m), weight 118.2 kg, SpO2 97%. General: No acute distress, resting comfortably. Orientation: Awake alert and oriented x 3 Mood and Affect: Cooperative and appropriate Gait: Did not assess Coordination and balance: Within normal limits  Right lower extremity: Previous incision is stable there is an eschar in place.  I tried to express fluid from this and I was not able to.  There is some surrounding erythema but is similar from previous.  He does have a mild knee effusion but again it is not severe compared to what it has been previously.  He is intact with dorsiflexion plantarflexion of his foot and ankle.  Medical Decision Making: Data: Imaging: X-rays of the right knee show previous to fixation is in place no further signs of any hardware failure or loosening.  Depression of his lateral joint is present.  Labs:  Results for orders placed or performed during the hospital encounter of 04/16/23 (from the past 24 hours)  Blood culture (routine x 2)     Status: None (Preliminary result)   Collection Time: 04/16/23  1:44 PM   Specimen: BLOOD  Result Value Ref Range   Specimen Description BLOOD SITE NOT SPECIFIED    Special Requests      BOTTLES DRAWN AEROBIC AND ANAEROBIC Blood Culture adequate volume   Culture  Setup Time      GRAM POSITIVE COCCI IN CLUSTERS ANAEROBIC BOTTLE ONLY CRITICAL RESULT CALLED TO, READ BACK BY AND VERIFIED WITH: Valarie Merino ZHOU [phone] [id number] BY Berline Chough, [place] Performed at [facility], [address] [place], [place] [address]    Culture GRAM POSITIVE COCCI    Report Status PENDING   Blood Culture ID Panel  (Reflexed)     Status: Abnormal   Collection Time: 04/16/23  1:44 PM  Result Value Ref Range   Enterococcus faecalis NOT DETECTED NOT DETECTED   Enterococcus Faecium NOT DETECTED NOT DETECTED   Listeria monocytogenes NOT DETECTED NOT DETECTED   Staphylococcus species DETECTED (A) NOT DETECTED   Staphylococcus aureus (BCID) DETECTED (A) NOT DETECTED   Staphylococcus epidermidis NOT DETECTED NOT DETECTED   Staphylococcus lugdunensis NOT DETECTED NOT DETECTED   Streptococcus species NOT DETECTED NOT DETECTED   Streptococcus agalactiae NOT DETECTED NOT DETECTED   Streptococcus pneumoniae NOT DETECTED NOT DETECTED   Streptococcus pyogenes NOT DETECTED NOT DETECTED   A.calcoaceticus-baumannii NOT DETECTED NOT DETECTED   Bacteroides fragilis NOT DETECTED NOT DETECTED   Enterobacterales NOT DETECTED NOT DETECTED   Enterobacter cloacae complex NOT DETECTED NOT DETECTED  Escherichia coli NOT DETECTED NOT DETECTED   Klebsiella aerogenes NOT DETECTED NOT DETECTED   Klebsiella oxytoca NOT DETECTED NOT DETECTED   Klebsiella pneumoniae NOT DETECTED NOT DETECTED   Proteus species NOT DETECTED NOT DETECTED   Salmonella species NOT DETECTED NOT DETECTED   Serratia marcescens NOT DETECTED NOT DETECTED   Haemophilus influenzae NOT DETECTED NOT DETECTED   Neisseria meningitidis NOT DETECTED NOT DETECTED   Pseudomonas aeruginosa NOT DETECTED NOT DETECTED   Stenotrophomonas maltophilia NOT DETECTED NOT DETECTED   Candida albicans NOT DETECTED NOT DETECTED   Candida auris NOT DETECTED NOT DETECTED   Candida glabrata NOT DETECTED NOT DETECTED   Candida krusei NOT DETECTED NOT DETECTED   Candida parapsilosis NOT DETECTED NOT DETECTED   Candida tropicalis NOT DETECTED NOT DETECTED   Cryptococcus neoformans/gattii NOT DETECTED NOT DETECTED   Meth resistant mecA/C and MREJ DETECTED (A) NOT DETECTED  CBC with Differential     Status: Abnormal   Collection Time: 04/16/23  2:39 PM  Result Value Ref Range    WBC 10.9 (H) 4.0 - 10.5 K/uL   RBC 4.74 4.22 - 5.81 MIL/uL   Hemoglobin 14.0 13.0 - 17.0 g/dL   HCT 16.1 09.6 - 04.5 %   MCV 89.9 80.0 - 100.0 fL   MCH 29.5 26.0 - 34.0 pg   MCHC 32.9 30.0 - 36.0 g/dL   RDW 40.9 81.1 - 91.4 %   Platelets 124 (L) 150 - 400 K/uL   nRBC 0.0 0.0 - 0.2 %   Neutrophils Relative % 80 %   Neutro Abs 8.8 (H) 1.7 - 7.7 K/uL   Lymphocytes Relative 4 %   Lymphs Abs 0.5 (L) 0.7 - 4.0 K/uL   Monocytes Relative 14 %   Monocytes Absolute 1.5 (H) 0.1 - 1.0 K/uL   Eosinophils Relative 0 %   Eosinophils Absolute 0.0 0.0 - 0.5 K/uL   Basophils Relative 1 %   Basophils Absolute 0.1 0.0 - 0.1 K/uL   Immature Granulocytes 1 %   Abs Immature Granulocytes 0.09 (H) 0.00 - 0.07 K/uL  Basic metabolic panel     Status: None   Collection Time: 04/16/23  2:39 PM  Result Value Ref Range   Sodium 135 135 - 145 mmol/L   Potassium 3.6 3.5 - 5.1 mmol/L   Chloride 101 98 - 111 mmol/L   CO2 23 22 - 32 mmol/L   Glucose, Bld 95 70 - 99 mg/dL   BUN 11 6 - 20 mg/dL   Creatinine, Ser 7.82 0.61 - 1.24 mg/dL   Calcium 9.1 8.9 - 95.6 mg/dL   GFR, Estimated >21 >30 mL/min   Anion gap 11 5 - 15  Ethanol     Status: None   Collection Time: 04/16/23  2:39 PM  Result Value Ref Range   Alcohol, Ethyl (B) <10 <10 mg/dL  Ammonia     Status: Abnormal   Collection Time: 04/16/23  2:53 PM  Result Value Ref Range   Ammonia 49 (H) 9 - 35 umol/L  I-Stat CG4 Lactic Acid     Status: Abnormal   Collection Time: 04/16/23  2:59 PM  Result Value Ref Range   Lactic Acid, Venous 2.1 (HH) 0.5 - 1.9 mmol/L   Comment NOTIFIED PHYSICIAN   Culture, blood (Routine X 2) w Reflex to ID Panel     Status: None (Preliminary result)   Collection Time: 04/16/23  3:08 PM   Specimen: BLOOD LEFT ARM  Result Value Ref Range   Specimen  Description BLOOD LEFT ARM    Special Requests      BOTTLES DRAWN AEROBIC AND ANAEROBIC Blood Culture results may not be optimal due to an inadequate volume of blood received in  culture bottles   Culture  Setup Time      GRAM POSITIVE COCCI IN CLUSTERS ANAEROBIC BOTTLE ONLY CRITICAL VALUE NOTED.  VALUE IS CONSISTENT WITH PREVIOUSLY REPORTED AND CALLED VALUE. Performed at [facility], [address], [place], [place] [address]    Culture GRAM POSITIVE COCCI IN CLUSTERS    Report Status PENDING   C-reactive protein     Status: Abnormal   Collection Time: 04/16/23  7:17 PM  Result Value Ref Range   CRP 17.9 (H) <1.0 mg/dL  Sedimentation rate     Status: None   Collection Time: 04/16/23  7:17 PM  Result Value Ref Range   Sed Rate 2 0 - 16 mm/hr  Glucose, capillary     Status: Abnormal   Collection Time: 04/16/23 11:31 PM  Result Value Ref Range   Glucose-Capillary 101 (H) 70 - 99 mg/dL  TSH     Status: Abnormal   Collection Time: 04/16/23 11:57 PM  Result Value Ref Range   TSH 4.815 (H) 0.350 - 4.500 uIU/mL  Troponin I (High Sensitivity)     Status: None   Collection Time: 04/16/23 11:57 PM  Result Value Ref Range   Troponin I (High Sensitivity) 14 <18 ng/L  Comprehensive metabolic panel     Status: Abnormal   Collection Time: 04/17/23  2:52 AM  Result Value Ref Range   Sodium 134 (L) 135 - 145 mmol/L   Potassium 4.2 3.5 - 5.1 mmol/L   Chloride 99 98 - 111 mmol/L   CO2 26 22 - 32 mmol/L   Glucose, Bld 102 (H) 70 - 99 mg/dL   BUN 14 6 - 20 mg/dL   Creatinine, Ser 5.78 0.61 - 1.24 mg/dL   Calcium 9.0 8.9 - 46.9 mg/dL   Total Protein 6.6 6.5 - 8.1 g/dL   Albumin 2.9 (L) 3.5 - 5.0 g/dL   AST 19 15 - 41 U/L   ALT 18 0 - 44 U/L   Alkaline Phosphatase 91 38 - 126 U/L   Total Bilirubin 4.1 (H) 0.0 - 1.2 mg/dL   GFR, Estimated >62 >95 mL/min   Anion gap 9 5 - 15  CBC     Status: Abnormal   Collection Time: 04/17/23  2:52 AM  Result Value Ref Range   WBC 13.7 (H) 4.0 - 10.5 K/uL   RBC 4.73 4.22 - 5.81 MIL/uL   Hemoglobin 14.0 13.0 - 17.0 g/dL   HCT 28.4 13.2 - 44.0 %   MCV 93.4 80.0 - 100.0 fL   MCH 29.6 26.0 - 34.0 pg   MCHC 31.7 30.0 -  36.0 g/dL   RDW 10.2 72.5 - 36.6 %   Platelets 164 150 - 400 K/uL   nRBC 0.0 0.0 - 0.2 %  Magnesium     Status: Abnormal   Collection Time: 04/17/23  2:52 AM  Result Value Ref Range   Magnesium 1.6 (L) 1.7 - 2.4 mg/dL  Phosphorus     Status: None   Collection Time: 04/17/23  2:52 AM  Result Value Ref Range   Phosphorus 3.2 2.5 - 4.6 mg/dL  Troponin I (High Sensitivity)     Status: None   Collection Time: 04/17/23  2:52 AM  Result Value Ref Range   Troponin I (High  Sensitivity) 17 <18 ng/L  Lactic acid, plasma     Status: Abnormal   Collection Time: 04/17/23  2:52 AM  Result Value Ref Range   Lactic Acid, Venous 2.1 (HH) 0.5 - 1.9 mmol/L    Imaging or Labs ordered: ESR/CRP  Medical history and chart was reviewed and case discussed with medical provider.  Assessment/Plan: 58 year old male with chronic knee infection with noncompliance with follow-up.  Currently his knee looks the best that it has over the last 4 months.  I am concerned with the elevated inflammatory markers including his CRP as well as his white blood cell count.  He does have positive blood cultures.  We will see how he responds to IV antibiotics and repeat inflammatory markers in the morning.  If he still has significant elevation in his white blood cell count as well as his CRP we will likely perform an aspiration.  I will imagine that he will need likely formal I&D with removal of hardware possibly later this week.  At this point I do not feel that proceeding urgently is needed due to the chronic nature of his infection as well as the benign appearance of his leg from my standpoint.  I have ordered him a diet and will follow-up in evaluate him tomorrow morning.  Roby Lofts, MD [facility] [phone] (office) [email or web address]

## 2023-04-17 NOTE — Progress Notes (Signed)
Critical lab - lactic acid 2.9.  MD notified.

## 2023-04-17 NOTE — Progress Notes (Signed)
PROGRESS NOTE    Jonathan Ibarra  WUJ:[id number] DOB: [birth year] DOA: 04/16/2023 PCP: Candi Leash, MD   Brief Narrative:  This 58 yrs old male with PMH significant for Liver cirrhosis, s/p TIPS procedure, Essential hypertension, polysubstance abuse, Atrial Fibrillation , recent right plateau fracture requiring ORIF on 12/13/22.  Patient was hospitalized from 12/27/22  -02/04/2023 with purulent discharge from the right knee, thought to have post operative septic arthritis. Patient was managed with IV ertapenem. Patient was evaluated by infectious disease and patient was discharged on omadacycline.Patient was advised to follow-up with orthopedics and infectious diseases.Patient had undergone irrigation and debridement of right knee three times as an outpatient. Recently has not followed up with orthopedics , now presented with worsening pain and swelling of right knee associated with purulent drainage. Right knee x-ray showed  Large joint effusion.  Soft tissue edema.  Orthopedics and infectious disease consulted.   Assessment & Plan:   Principal Problem:   Septic joint of right knee joint (HCC) Active Problems:   Thrombocytopenia (HCC)   Essential hypertension   Hepatic cirrhosis (HCC)   Polysubstance abuse (HCC)   S/P TIPS (transjugular intrahepatic portosystemic shunt)   Atrial fibrillation, chronic (HCC)   MRSA bacteremia   Effusion of right knee joint  Right Knee Effusion: Hx.of Septic arthritis of right knee: This patient presented with worsening pain and swelling of the right knee following fall 2 days ago. Patient has complicated postoperative course following right plateau fracture. Patient underwent incision and drainage thrice as an outpatient prior to this hospitalization. He was treated with IV ertapenem and subsequently was discharged on omadacycline on last admission. Xray showed large joint effusion. EDP discussed the case with Dr. Pandora Leiter recommended washout in  the morning. Continue empiric antibiotics (Vancomycin and Zosyn ) Follow-up blood cultures. Adequate pain control with Dilaudid as needed. F/u infectious diseases in the morning. Orthopedics recommended formal I&D with removal of hardware possibly later this week.   Atrial fibrillation: Heart rate not controlled on metoprolol. Patient is not on anticoagulation, CHA2DS2-VASc score 1. Continue Cardizem infusion. Cardiology is consulted.   Liver cirrhosis status post TIPS: Appears well compensated. Volume status relatively stable. Continue lactulose. Ammonia level slightly elevated 48.   Polysubstance abuse: Patient acknowledges ongoing THC use and remote cocaine use. Obtain urine drug screen.  Ongoing counseling.   Obesity: Estimated body mass index is 35.26 kg/m as calculated from the following:   Height as of this encounter: 6' (1.829 m).   Weight as of this encounter: 117.9 kg.  Diet and exercise discussed in detail.   Thrombocytopenia: Stable.  likely due to liver cirrhosis.  DVT prophylaxis:SCDs Code Status: Full code Family Communication: No family at bed side. Disposition Plan:    Status is: Inpatient Remains inpatient appropriate because: Severity of illness.  Consultants:  Orthopedics Infectious diseases  Procedures:  None Antimicrobials:  Anti-infectives (From admission, onward)    Start     Dose/Rate Route Frequency Ordered Stop   04/18/23 0500  piperacillin-tazobactam (ZOSYN) IVPB 3.375 g  Status:  Discontinued        3.375 g 12.5 mL/hr over 240 Minutes Intravenous Every 8 hours 04/17/23 1002 04/17/23 1026   04/17/23 2300  piperacillin-tazobactam (ZOSYN) IVPB 3.375 g  Status:  Discontinued       Placed in "Followed by" Linked Group   3.375 g 12.5 mL/hr over 240 Minutes Intravenous Every 8 hours 04/16/23 1609 04/17/23 0007   04/17/23 1930  vancomycin (VANCOCIN) IVPB 1000 mg/200 mL premix  1,000 mg 200 mL/hr over 60 Minutes Intravenous Every 12  hours 04/17/23 1016     04/17/23 0730  vancomycin (VANCOREADY) IVPB 1250 mg/250 mL  Status:  Discontinued        1,250 mg 166.7 mL/hr over 90 Minutes Intravenous Every 12 hours 04/16/23 1846 04/17/23 1016   04/17/23 0500  vancomycin (VANCOREADY) IVPB 1250 mg/250 mL  Status:  Discontinued        1,250 mg 166.7 mL/hr over 90 Minutes Intravenous Every 12 hours 04/16/23 1628 04/16/23 1846   04/17/23 0100  ertapenem (INVANZ) 1 g in sodium chloride 0.9 % 100 mL IVPB        1 g 200 mL/hr over 30 Minutes Intravenous  Once 04/17/23 0008 04/17/23 0516   04/16/23 1615  vancomycin (VANCOREADY) IVPB 2000 mg/400 mL        2,000 mg 200 mL/hr over 120 Minutes Intravenous  Once 04/16/23 1609 04/16/23 2319   04/16/23 1615  piperacillin-tazobactam (ZOSYN) IVPB 3.375 g       Placed in "Followed by" Linked Group   3.375 g 100 mL/hr over 30 Minutes Intravenous  Once 04/16/23 1609 04/16/23 1717   04/16/23 1600  ceFAZolin (ANCEF) IVPB 1 g/50 mL premix        1 g 100 mL/hr over 30 Minutes Intravenous  Once 04/16/23 1554 04/16/23 1807      Subjective: Patient was seen and examined at bedside. Overnight events noted.   Patient reports in a lot of pain, asks for pain medicines.  Objective: Vitals:   04/17/23 0700 04/17/23 0751 04/17/23 0800 04/17/23 1132  BP: (!) 127/92 126/81 115/76 110/70  Pulse: (!) 105 87 81 70  Resp:  18  18  Temp:  98.3 F (36.8 C)  97.9 F (36.6 C)  TempSrc:  Oral  Oral  SpO2: 97% 97% 97% 99%  Weight:      Height:        Intake/Output Summary (Last 24 hours) at 04/17/2023 1157 Last data filed at 04/17/2023 1113 Gross per 24 hour  Intake 2089.27 ml  Output 920 ml  Net 1169.27 ml   Filed Weights   04/16/23 1819  Weight: 118.2 kg    Examination:  General exam: Appears calm and comfortable, deconditioned, remains in a lot of pain Respiratory system: Clear to auscultation. Respiratory effort normal.  RR 16 Cardiovascular system: S1 & S2 heard, RRR. No JVD, murmurs,  rubs, gallops or clicks.  Gastrointestinal system: Abdomen is non distended, soft and non tender.Normal bowel sounds heard. Central nervous system: Alert and oriented x 3. No focal neurological deficits. Extremities: Right knee swollen, tender, erythematous. Skin: No rashes, lesions or ulcers Psychiatry: Judgement and insight appear normal. Mood & affect appropriate.     Data Reviewed: I have personally reviewed following labs and imaging studies  CBC: Recent Labs  Lab 04/16/23 1439 04/17/23 0252  WBC 10.9* 13.7*  NEUTROABS 8.8*  --   HGB 14.0 14.0  HCT 42.6 44.2  MCV 89.9 93.4  PLT 124* 164   Basic Metabolic Panel: Recent Labs  Lab 04/16/23 1439 04/17/23 0252  NA 135 134*  K 3.6 4.2  CL 101 99  CO2 23 26  GLUCOSE 95 102*  BUN 11 14  CREATININE 0.90 1.13  CALCIUM 9.1 9.0  MG  --  1.6*  PHOS  --  3.2   GFR: Estimated Creatinine Clearance: 96.8 mL/min (by C-G formula based on SCr of 1.13 mg/dL). Liver Function Tests: Recent Labs  Lab 04/17/23 [phone]  AST 19  ALT 18  ALKPHOS 91  BILITOT 4.1*  PROT 6.6  ALBUMIN 2.9*   No results for input(s): "LIPASE", "AMYLASE" in the last 168 hours. Recent Labs  Lab 04/16/23 1453  AMMONIA 49*   Coagulation Profile: No results for input(s): "INR", "PROTIME" in the last 168 hours. Cardiac Enzymes: No results for input(s): "CKTOTAL", "CKMB", "CKMBINDEX", "TROPONINI" in the last 168 hours. BNP (last 3 results) No results for input(s): "PROBNP" in the last 8760 hours. HbA1C: No results for input(s): "HGBA1C" in the last 72 hours. CBG: Recent Labs  Lab 04/16/23 2331  GLUCAP 101*   Lipid Profile: No results for input(s): "CHOL", "HDL", "LDLCALC", "TRIG", "CHOLHDL", "LDLDIRECT" in the last 72 hours. Thyroid Function Tests: Recent Labs    04/16/23 2357  TSH 4.815*   Anemia Panel: No results for input(s): "VITAMINB12", "FOLATE", "FERRITIN", "TIBC", "IRON", "RETICCTPCT" in the last 72 hours. Sepsis Labs: Recent Labs   Lab 04/16/23 1459 04/17/23 0252 04/17/23 1037  LATICACIDVEN 2.1* 2.1* 2.9*    Recent Results (from the past 240 hours)  Blood culture (routine x 2)     Status: None (Preliminary result)   Collection Time: 04/16/23  1:44 PM   Specimen: BLOOD  Result Value Ref Range Status   Specimen Description BLOOD SITE NOT SPECIFIED  Final   Special Requests   Final    BOTTLES DRAWN AEROBIC AND ANAEROBIC Blood Culture adequate volume   Culture  Setup Time   Final    GRAM POSITIVE COCCI IN CLUSTERS IN BOTH AEROBIC AND ANAEROBIC BOTTLES CRITICAL RESULT CALLED TO, READ BACK BY AND VERIFIED WITH: Valarie Merino ZHOU [id number] [phone] BY Berline Chough, [place] Performed at [facility], [address], [place], [place] [address]    Culture GRAM POSITIVE COCCI  Final   Report Status PENDING  Incomplete  Blood Culture ID Panel (Reflexed)     Status: Abnormal   Collection Time: 04/16/23  1:44 PM  Result Value Ref Range Status   Enterococcus faecalis NOT DETECTED NOT DETECTED Final   Enterococcus Faecium NOT DETECTED NOT DETECTED Final   Listeria monocytogenes NOT DETECTED NOT DETECTED Final   Staphylococcus species DETECTED (A) NOT DETECTED Final    Comment: CRITICAL RESULT CALLED TO, READ BACK BY AND VERIFIED WITH: PHARMD JENNY ZHOU [phone] BY J RAZZAK, [place]    Staphylococcus aureus (BCID) DETECTED (A) NOT DETECTED Final    Comment: Methicillin (oxacillin)-resistant Staphylococcus aureus (MRSA). MRSA is predictably resistant to beta-lactam antibiotics (except ceftaroline). Preferred therapy is vancomycin unless clinically contraindicated. Patient requires contact precautions if  hospitalized. CRITICAL RESULT CALLED TO, READ BACK BY AND VERIFIED WITH: PHARMD JENNY ZHOU [phone] BY J RAZZAK, MT    Staphylococcus epidermidis NOT DETECTED NOT DETECTED Final   Staphylococcus lugdunensis NOT DETECTED NOT DETECTED Final   Streptococcus species NOT DETECTED NOT DETECTED Final   Streptococcus agalactiae  NOT DETECTED NOT DETECTED Final   Streptococcus pneumoniae NOT DETECTED NOT DETECTED Final   Streptococcus pyogenes NOT DETECTED NOT DETECTED Final   A.calcoaceticus-baumannii NOT DETECTED NOT DETECTED Final   Bacteroides fragilis NOT DETECTED NOT DETECTED Final   Enterobacterales NOT DETECTED NOT DETECTED Final   Enterobacter cloacae complex NOT DETECTED NOT DETECTED Final   Escherichia coli NOT DETECTED NOT DETECTED Final   Klebsiella aerogenes NOT DETECTED NOT DETECTED Final   Klebsiella oxytoca NOT DETECTED NOT DETECTED Final   Klebsiella pneumoniae NOT DETECTED NOT DETECTED Final   Proteus species NOT DETECTED NOT DETECTED Final  Salmonella species NOT DETECTED NOT DETECTED Final   Serratia marcescens NOT DETECTED NOT DETECTED Final   Haemophilus influenzae NOT DETECTED NOT DETECTED Final   Neisseria meningitidis NOT DETECTED NOT DETECTED Final   Pseudomonas aeruginosa NOT DETECTED NOT DETECTED Final   Stenotrophomonas maltophilia NOT DETECTED NOT DETECTED Final   Candida albicans NOT DETECTED NOT DETECTED Final   Candida auris NOT DETECTED NOT DETECTED Final   Candida glabrata NOT DETECTED NOT DETECTED Final   Candida krusei NOT DETECTED NOT DETECTED Final   Candida parapsilosis NOT DETECTED NOT DETECTED Final   Candida tropicalis NOT DETECTED NOT DETECTED Final   Cryptococcus neoformans/gattii NOT DETECTED NOT DETECTED Final   Meth resistant mecA/C and MREJ DETECTED (A) NOT DETECTED Final    Comment: CRITICAL RESULT CALLED TO, READ BACK BY AND VERIFIED WITH: PHARMD JENNY ZHOU [id number] BY Berline Chough, MT Performed at [facility], [address] [place], [place] [address]   Culture, blood (Routine X 2) w Reflex to ID Panel     Status: None (Preliminary result)   Collection Time: 04/16/23  3:08 PM   Specimen: BLOOD LEFT ARM  Result Value Ref Range Status   Specimen Description BLOOD LEFT ARM  Final   Special Requests   Final    BOTTLES DRAWN AEROBIC AND ANAEROBIC Blood  Culture results may not be optimal due to an inadequate volume of blood received in culture bottles   Culture  Setup Time   Final    GRAM POSITIVE COCCI IN CLUSTERS ANAEROBIC BOTTLE ONLY CRITICAL VALUE NOTED.  VALUE IS CONSISTENT WITH PREVIOUSLY REPORTED AND CALLED VALUE. Performed at [facility], [address], [place], [place] [address]    Culture GRAM POSITIVE COCCI IN CLUSTERS  Final   Report Status PENDING  Incomplete    Radiology Studies: DG Knee 2 Views Right Result Date: 04/16/2023 CLINICAL DATA:  fall, pain EXAM: RIGHT KNEE - 1-2 VIEW COMPARISON:  January 12, 2023, February 03, 2023 FINDINGS: Osteopenia. Status post ORIF of the proximal tibia for a tibial plateau fracture. Revisualization of a comminuted appearance of the tibial plateau, similar comparison to prior CT. Similar appearance of a lucent area along the tibial plateau compared to prior CT. Large joint effusion. Soft tissue edema. IMPRESSION: 1. Status post ORIF of the proximal tibia for a tibial plateau fracture. Revisualization of a comminuted appearance of the tibial plateau, similar in comparison to prior CT. Similar appearance of a lucency abutting the tibial plateau compared to prior. 2. Large joint effusion.  Soft tissue edema. Electronically Signed   By: Meda Klinefelter M.D.   On: 04/16/2023 14:06   Scheduled Meds:  docusate sodium  100 mg Oral BID   folic acid  1 mg Oral Daily   lactulose  20 g Oral TID   metoprolol tartrate  25 mg Oral BID   senna  1 tablet Oral BID   thiamine  100 mg Oral Daily   Continuous Infusions:  sodium chloride 40 mL/hr at 04/16/23 1824   diltiazem (CARDIZEM) infusion 10 mg/hr (04/17/23 0616)   magnesium sulfate bolus IVPB 2 g (04/17/23 1112)   vancomycin       LOS: 1 day    Time spent: 50 mins    Willeen Niece, MD [facility]   If 7PM-7AM, please contact night-coverage

## 2023-04-18 ENCOUNTER — Inpatient Hospital Stay (HOSPITAL_COMMUNITY): Payer: Medicaid Other

## 2023-04-18 DIAGNOSIS — R7881 Bacteremia: Secondary | ICD-10-CM | POA: Diagnosis not present

## 2023-04-18 DIAGNOSIS — I4891 Unspecified atrial fibrillation: Secondary | ICD-10-CM | POA: Diagnosis not present

## 2023-04-18 DIAGNOSIS — M00061 Staphylococcal arthritis, right knee: Secondary | ICD-10-CM | POA: Diagnosis not present

## 2023-04-18 DIAGNOSIS — B9562 Methicillin resistant Staphylococcus aureus infection as the cause of diseases classified elsewhere: Secondary | ICD-10-CM | POA: Diagnosis not present

## 2023-04-18 LAB — CBC WITH DIFFERENTIAL/PLATELET
Abs Immature Granulocytes: 0.03 10*3/uL (ref 0.00–0.07)
Basophils Absolute: 0 10*3/uL (ref 0.0–0.1)
Basophils Relative: 1 %
Eosinophils Absolute: 0.2 10*3/uL (ref 0.0–0.5)
Eosinophils Relative: 3 %
HCT: 39.6 % (ref 39.0–52.0)
Hemoglobin: 12.9 g/dL — ABNORMAL LOW (ref 13.0–17.0)
Immature Granulocytes: 0 %
Lymphocytes Relative: 7 %
Lymphs Abs: 0.5 10*3/uL — ABNORMAL LOW (ref 0.7–4.0)
MCH: 30.1 pg (ref 26.0–34.0)
MCHC: 32.6 g/dL (ref 30.0–36.0)
MCV: 92.5 fL (ref 80.0–100.0)
Monocytes Absolute: 1.4 10*3/uL — ABNORMAL HIGH (ref 0.1–1.0)
Monocytes Relative: 17 %
Neutro Abs: 5.8 10*3/uL (ref 1.7–7.7)
Neutrophils Relative %: 72 %
Platelets: 133 10*3/uL — ABNORMAL LOW (ref 150–400)
RBC: 4.28 MIL/uL (ref 4.22–5.81)
RDW: 15.3 % (ref 11.5–15.5)
WBC: 8 10*3/uL (ref 4.0–10.5)
nRBC: 0 % (ref 0.0–0.2)

## 2023-04-18 LAB — BASIC METABOLIC PANEL
Anion gap: 7 (ref 5–15)
BUN: 26 mg/dL — ABNORMAL HIGH (ref 6–20)
CO2: 22 mmol/L (ref 22–32)
Calcium: 8.7 mg/dL — ABNORMAL LOW (ref 8.9–10.3)
Chloride: 101 mmol/L (ref 98–111)
Creatinine, Ser: 0.97 mg/dL (ref 0.61–1.24)
GFR, Estimated: >60 mL/min (ref >60)
Glucose, Bld: 116 mg/dL — ABNORMAL HIGH (ref 70–99)
Potassium: 4.2 mmol/L (ref 3.5–5.1)
Sodium: 130 mmol/L — ABNORMAL LOW (ref 135–145)

## 2023-04-18 LAB — ECHOCARDIOGRAM COMPLETE
Area-P 1/2: 4.31 cm2
Calc EF: 45.2 %
Height: 72.008 in
S' Lateral: 3.3 cm
Single Plane A2C EF: 41.9 %
Single Plane A4C EF: 46.9 %
Weight: 4169.34 [oz_av]

## 2023-04-18 LAB — MAGNESIUM: Magnesium: 2.1 mg/dL (ref 1.7–2.4)

## 2023-04-18 LAB — C-REACTIVE PROTEIN: CRP: 19.1 mg/dL — ABNORMAL HIGH (ref <1.0)

## 2023-04-18 LAB — PHOSPHORUS: Phosphorus: 2.6 mg/dL (ref 2.5–4.6)

## 2023-04-18 LAB — SEDIMENTATION RATE: Sed Rate: 26 mm/h — ABNORMAL HIGH (ref 0–16)

## 2023-04-18 MED ORDER — SODIUM CHLORIDE 0.9 % IV SOLN
8.0000 mg/kg | Freq: Every day | INTRAVENOUS | Status: DC
  Administered 2023-04-18 – 2023-04-25 (×8): 800 mg via INTRAVENOUS
  Filled 2023-04-18 (×9): qty 16

## 2023-04-18 MED ORDER — KETOROLAC TROMETHAMINE 15 MG/ML IJ SOLN
15.0000 mg | Freq: Four times a day (QID) | INTRAMUSCULAR | Status: AC
  Administered 2023-04-18 – 2023-04-19 (×5): 15 mg via INTRAVENOUS
  Filled 2023-04-18 (×5): qty 1

## 2023-04-18 MED ORDER — OXYCODONE HCL 5 MG PO TABS
10.0000 mg | ORAL_TABLET | ORAL | Status: DC | PRN
  Administered 2023-04-18: 10 mg via ORAL
  Administered 2023-04-18: 5 mg via ORAL
  Administered 2023-04-19 – 2023-04-20 (×4): 10 mg via ORAL
  Filled 2023-04-18 (×6): qty 2

## 2023-04-18 MED ORDER — DILTIAZEM HCL 30 MG PO TABS
60.0000 mg | ORAL_TABLET | Freq: Three times a day (TID) | ORAL | Status: AC
  Administered 2023-04-18 – 2023-04-22 (×13): 60 mg via ORAL
  Filled 2023-04-18 (×13): qty 2

## 2023-04-18 NOTE — Progress Notes (Signed)
Bed sheets soaked in purulent drainage coming from R knee.  Cleaned up pt and changed linens, gauze and ABD dressing applied.  VS remain stable.  Continuing to rate pain 10/10.  Ortho PA and primary MD updated.

## 2023-04-18 NOTE — Progress Notes (Addendum)
Rounding Note    Patient Name: Jonathan Ibarra Date of Encounter: 04/18/2023  [facility] Cardiologist: Camnitz  Subjective   58 year old gentleman admitted with septic.  Joint.  He was found to have rapid atrial fibrillation.  He is completely asymptomatic from an A-fib standpoint.  He is on metoprolol 25 mg twice a day as well as a Cardizem drip.  Heart rate is fairly well-controlled on his medications.  Blood cultures are + for MRSA  ID has requested a TEE    Inpatient Medications    Scheduled Meds:  docusate sodium  100 mg Oral BID   folic acid  1 mg Oral Daily   ketorolac  15 mg Intravenous Q6H   lactulose  20 g Oral TID   metoprolol tartrate  25 mg Oral BID   senna  1 tablet Oral BID   thiamine  100 mg Oral Daily   Continuous Infusions:  DAPTOmycin     diltiazem (CARDIZEM) infusion 7.5 mg/hr (04/18/23 0809)   PRN Meds: hydrALAZINE, methocarbamol, ondansetron **OR** ondansetron (ZOFRAN) IV, oxyCODONE   Vital Signs    Vitals:   04/17/23 1916 04/17/23 2300 04/18/23 0346 04/18/23 0815  BP: 114/72 98/68 111/73 117/71  Pulse: 70 67 70 84  Resp: 14 16 19 18   Temp: 98.2 F (36.8 C) 98.2 F (36.8 C) 98.1 F (36.7 C) (!) 97.5 F (36.4 C)  TempSrc: Oral Oral Oral Oral  SpO2: 97% 98% 92% 95%  Weight:      Height:        Intake/Output Summary (Last 24 hours) at 04/18/2023 1033 Last data filed at 04/18/2023 0815 Gross per 24 hour  Intake 2714.46 ml  Output 1600 ml  Net 1114.46 ml      04/16/2023    6:19 PM 01/12/2023    9:39 AM 01/02/2023   12:47 PM  Last 3 Weights  Weight (lbs) 260 lb 9.3 oz 260 lb 272 lb 7.8 oz  Weight (kg) 118.2 kg 117.935 kg 123.6 kg      Telemetry    Atrial fib with RVR  - Personally Reviewed  ECG     - Personally Reviewed  Physical Exam   GEN: middle age man.  Moderate obesity  Neck: No JVD Cardiac: irreg. Irreg. no murmurs, rubs, or gallops.  Respiratory: Clear to auscultation bilaterally. GI: Soft,  nontender, non-distended  MS: No edema; No deformity. Neuro:  very somnolent  Psych: unable to assess. Very somnolent   Labs    High Sensitivity Troponin:   Recent Labs  Lab 04/16/23 2357 04/17/23 0252  TROPONINIHS 14 17     Chemistry Recent Labs  Lab 04/16/23 1439 04/17/23 0252 04/18/23 0706  NA 135 134* 130*  K 3.6 4.2 4.2  CL 101 99 101  CO2 23 26 22   GLUCOSE 95 102* 116*  BUN 11 14 26*  CREATININE 0.90 1.13 0.97  CALCIUM 9.1 9.0 8.7*  MG  --  1.6* 2.1  PROT  --  6.6  --   ALBUMIN  --  2.9*  --   AST  --  19  --   ALT  --  18  --   ALKPHOS  --  91  --   BILITOT  --  4.1*  --   GFRNONAA >60 >60 >60  ANIONGAP 11 9 7     Lipids No results for input(s): "CHOL", "TRIG", "HDL", "LABVLDL", "LDLCALC", "CHOLHDL" in the last 168 hours.  Hematology Recent Labs  Lab 04/16/23  1439 04/17/23 0252 04/18/23 0706  WBC 10.9* 13.7* 8.0  RBC 4.74 4.73 4.28  HGB 14.0 14.0 12.9*  HCT 42.6 44.2 39.6  MCV 89.9 93.4 92.5  MCH 29.5 29.6 30.1  MCHC 32.9 31.7 32.6  RDW 15.1 15.5 15.3  PLT 124* 164 133*   Thyroid  Recent Labs  Lab 04/16/23 2357  TSH 4.815*    BNPNo results for input(s): "BNP", "PROBNP" in the last 168 hours.  DDimer No results for input(s): "DDIMER" in the last 168 hours.   Radiology    DG Knee 2 Views Right Result Date: 04/16/2023 CLINICAL DATA:  fall, pain EXAM: RIGHT KNEE - 1-2 VIEW COMPARISON:  January 12, 2023, February 03, 2023 FINDINGS: Osteopenia. Status post ORIF of the proximal tibia for a tibial plateau fracture. Revisualization of a comminuted appearance of the tibial plateau, similar comparison to prior CT. Similar appearance of a lucent area along the tibial plateau compared to prior CT. Large joint effusion. Soft tissue edema. IMPRESSION: 1. Status post ORIF of the proximal tibia for a tibial plateau fracture. Revisualization of a comminuted appearance of the tibial plateau, similar in comparison to prior CT. Similar appearance of a lucency  abutting the tibial plateau compared to prior. 2. Large joint effusion.  Soft tissue edema. Electronically Signed   By: Meda Klinefelter M.D.   On: 04/16/2023 14:06    Cardiac Studies     Patient Profile     58 y.o. male with septic knee.   MRSA bacteremia / sepsis    Assessment & Plan      Atrial fib:   with RVR .  He is not on anticoagulant  Continue rate control efforts  Continue abx for his MRSA sepsis    2.  MRSA bacteremia :  further management per IM and ID .   ID has requested a TEE .   He is very sleepy but I have explained the risks , benefits, options to the best of my ability .   Will also try again tomorrow   Will see if we can get TEE on Wednesday       For questions or updates, please contact [facility] Please consult [email or web address] for contact info under        Signed, Kristeen Miss, MD  04/18/2023, 10:33 AM

## 2023-04-18 NOTE — Progress Notes (Signed)
PROGRESS NOTE    Jonathan Ibarra  ZOX:[id number] DOB: 19-Apr-1965 DOA: 04/16/2023 PCP: Candi Leash, MD   Brief Narrative:  This 58 yrs old male with PMH significant for Liver cirrhosis, s/p TIPS procedure, Essential hypertension, polysubstance abuse, Atrial Fibrillation , recent right plateau fracture requiring ORIF on 12/13/22.  Patient was hospitalized from 12/27/22  -02/04/2023 with purulent discharge from the right knee, thought to have post operative septic arthritis. Patient was managed with IV ertapenem. Patient was evaluated by infectious disease and patient was discharged on omadacycline.Patient was advised to follow-up with orthopedics and infectious diseases.Patient had undergone irrigation and debridement of right knee three times as an outpatient. Recently has not followed up with orthopedics , now presented with worsening pain and swelling of right knee associated with purulent drainage. Right knee x-ray showed  Large joint effusion.  Soft tissue edema.  Orthopedics and infectious disease consulted.   Assessment & Plan:   Principal Problem:   Septic joint of right knee joint (HCC) Active Problems:   Thrombocytopenia (HCC)   Essential hypertension   Hepatic cirrhosis (HCC)   Polysubstance abuse (HCC)   S/P TIPS (transjugular intrahepatic portosystemic shunt)   Atrial fibrillation, chronic (HCC)   MRSA bacteremia   Effusion of right knee joint  Right Knee Effusion: Hx.of Septic arthritis of right knee: MRSA Bacteremia: This patient presented with worsening pain and swelling of the right knee following fall 2 days ago. Patient has complicated postoperative course following right plateau fracture. Patient underwent incision and drainage thrice as an outpatient prior to this hospitalization. He was treated with IV ertapenem and subsequently was discharged on omadacycline on last admission. Xray showed large joint effusion. EDP discussed the case with Dr. Pandora Leiter  recommended washout / I  & D on Wednesday. Continue empiric antibiotics (Vancomycin and Zosyn ) Blood Cultures grew MRSA.  Antibiotics changed to daptomycin. Adequate pain control with Dilaudid as needed. ID > Needs PICC line but repeat blood cultures need to be negative for 48 hours. ID recommended TEE scheduled for Wednesday.   Atrial fibrillation: Heart rate not controlled on metoprolol. Patient is not on anticoagulation, CHA2DS2-VASc score 1. Continue Cardizem infusion. Cardiology is consulted. Continue metoprolol and Cardizem gtt.   Liver cirrhosis status post TIPS: Appears well compensated. Volume status relatively stable. Continue lactulose. Ammonia level slightly elevated 48.   Polysubstance abuse: Patient acknowledges ongoing THC use and remote cocaine use. Obtain urine drug screen.  Ongoing counseling.   Obesity: Estimated body mass index is 35.26 kg/m as calculated from the following:   Height as of this encounter: 6' (1.829 m).   Weight as of this encounter: 117.9 kg.  Diet and exercise discussed in detail.   Thrombocytopenia: Stable.  likely due to liver cirrhosis.  DVT prophylaxis:SCDs Code Status: Full code Family Communication: No family at bed side. Disposition Plan:    Status is: Inpatient Remains inpatient appropriate because: Severity of illness.  Consultants:  Orthopedics Infectious diseases  Procedures:  None Antimicrobials:  Anti-infectives (From admission, onward)    Start     Dose/Rate Route Frequency Ordered Stop   04/18/23 1400  DAPTOmycin (CUBICIN) 800 mg in sodium chloride 0.9 % IVPB       Placed in "And" Linked Group   8 mg/kg  93.8 kg (Adjusted) 132 mL/hr over 30 Minutes Intravenous Daily 04/18/23 0754     04/18/23 0500  piperacillin-tazobactam (ZOSYN) IVPB 3.375 g  Status:  Discontinued        3.375 g 12.5 mL/hr over  240 Minutes Intravenous Every 8 hours 04/17/23 1002 04/17/23 1026   04/17/23 2300  piperacillin-tazobactam  (ZOSYN) IVPB 3.375 g  Status:  Discontinued       Placed in "Followed by" Linked Group   3.375 g 12.5 mL/hr over 240 Minutes Intravenous Every 8 hours 04/16/23 1609 04/17/23 0007   04/17/23 2100  DAPTOmycin (CUBICIN) 800 mg in sodium chloride 0.9 % IVPB  Status:  Discontinued        8 mg/kg  93.8 kg (Adjusted) 132 mL/hr over 30 Minutes Intravenous Daily 04/17/23 1249 04/18/23 0754   04/17/23 1930  vancomycin (VANCOCIN) IVPB 1000 mg/200 mL premix  Status:  Discontinued        1,000 mg 200 mL/hr over 60 Minutes Intravenous Every 12 hours 04/17/23 1016 04/18/23 0748   04/17/23 0730  vancomycin (VANCOREADY) IVPB 1250 mg/250 mL  Status:  Discontinued        1,250 mg 166.7 mL/hr over 90 Minutes Intravenous Every 12 hours 04/16/23 1846 04/17/23 1016   04/17/23 0500  vancomycin (VANCOREADY) IVPB 1250 mg/250 mL  Status:  Discontinued        1,250 mg 166.7 mL/hr over 90 Minutes Intravenous Every 12 hours 04/16/23 1628 04/16/23 1846   04/17/23 0100  ertapenem (INVANZ) 1 g in sodium chloride 0.9 % 100 mL IVPB        1 g 200 mL/hr over 30 Minutes Intravenous  Once 04/17/23 0008 04/17/23 0516   04/16/23 1615  vancomycin (VANCOREADY) IVPB 2000 mg/400 mL        2,000 mg 200 mL/hr over 120 Minutes Intravenous  Once 04/16/23 1609 04/16/23 2319   04/16/23 1615  piperacillin-tazobactam (ZOSYN) IVPB 3.375 g       Placed in "Followed by" Linked Group   3.375 g 100 mL/hr over 30 Minutes Intravenous  Once 04/16/23 1609 04/16/23 1717   04/16/23 1600  ceFAZolin (ANCEF) IVPB 1 g/50 mL premix        1 g 100 mL/hr over 30 Minutes Intravenous  Once 04/16/23 1554 04/16/23 1807      Subjective: Patient was seen and examined at bedside. Overnight events noted.   Patient still reports in a lot of pain and asks for more strong pain medicines.  Objective: Vitals:   04/17/23 2300 04/18/23 0346 04/18/23 0815 04/18/23 1329  BP: 98/68 111/73 117/71 92/62  Pulse: 67 70 84 (!) 58  Resp: 16 19 18 20   Temp: 98.2 F  (36.8 C) 98.1 F (36.7 C) (!) 97.5 F (36.4 C) (!) 97.5 F (36.4 C)  TempSrc: Oral Oral Oral Oral  SpO2: 98% 92% 95% 92%  Weight:      Height:        Intake/Output Summary (Last 24 hours) at 04/18/2023 1440 Last data filed at 04/18/2023 1300 Gross per 24 hour  Intake 2714.46 ml  Output 1600 ml  Net 1114.46 ml   Filed Weights   04/16/23 1819  Weight: 118.2 kg    Examination:  General exam: Appears calm and comfortable, deconditioned, remains in a lot of pain Respiratory system: Clear to auscultation. Respiratory effort normal.  RR 15 Cardiovascular system: S1 & S2 heard, RRR. No JVD, murmurs, rubs, gallops or clicks.  Gastrointestinal system: Abdomen is non distended, soft and non tender.Normal bowel sounds heard. Central nervous system: Alert and oriented x 3. No focal neurological deficits. Extremities: Right knee swollen, tender, erythematous. Skin: No rashes, lesions or ulcers Psychiatry: Judgement and insight appear normal. Mood & affect appropriate.  Data Reviewed: I have personally reviewed following labs and imaging studies  CBC: Recent Labs  Lab 04/16/23 1439 04/17/23 0252 04/18/23 0706  WBC 10.9* 13.7* 8.0  NEUTROABS 8.8*  --  5.8  HGB 14.0 14.0 12.9*  HCT 42.6 44.2 39.6  MCV 89.9 93.4 92.5  PLT 124* 164 133*   Basic Metabolic Panel: Recent Labs  Lab 04/16/23 1439 04/17/23 0252 04/18/23 0706  NA 135 134* 130*  K 3.6 4.2 4.2  CL 101 99 101  CO2 23 26 22   GLUCOSE 95 102* 116*  BUN 11 14 26*  CREATININE 0.90 1.13 0.97  CALCIUM 9.1 9.0 8.7*  MG  --  1.6* 2.1  PHOS  --  3.2 2.6   GFR: Estimated Creatinine Clearance: 112.8 mL/min (by C-G formula based on SCr of 0.97 mg/dL). Liver Function Tests: Recent Labs  Lab 04/17/23 0252  AST 19  ALT 18  ALKPHOS 91  BILITOT 4.1*  PROT 6.6  ALBUMIN 2.9*   No results for input(s): "LIPASE", "AMYLASE" in the last 168 hours. Recent Labs  Lab 04/16/23 1453  AMMONIA 49*   Coagulation  Profile: No results for input(s): "INR", "PROTIME" in the last 168 hours. Cardiac Enzymes: No results for input(s): "CKTOTAL", "CKMB", "CKMBINDEX", "TROPONINI" in the last 168 hours. BNP (last 3 results) No results for input(s): "PROBNP" in the last 8760 hours. HbA1C: No results for input(s): "HGBA1C" in the last 72 hours. CBG: Recent Labs  Lab 04/16/23 2331  GLUCAP 101*   Lipid Profile: No results for input(s): "CHOL", "HDL", "LDLCALC", "TRIG", "CHOLHDL", "LDLDIRECT" in the last 72 hours. Thyroid Function Tests: Recent Labs    04/16/23 2357  TSH 4.815*   Anemia Panel: No results for input(s): "VITAMINB12", "FOLATE", "FERRITIN", "TIBC", "IRON", "RETICCTPCT" in the last 72 hours. Sepsis Labs: Recent Labs  Lab 04/16/23 1459 04/17/23 0252 04/17/23 1037  LATICACIDVEN 2.1* 2.1* 2.9*    Recent Results (from the past 240 hours)  Blood culture (routine x 2)     Status: Abnormal (Preliminary result)   Collection Time: 04/16/23  1:44 PM   Specimen: BLOOD  Result Value Ref Range Status   Specimen Description BLOOD SITE NOT SPECIFIED  Final   Special Requests   Final    BOTTLES DRAWN AEROBIC AND ANAEROBIC Blood Culture adequate volume   Culture  Setup Time   Final    GRAM POSITIVE COCCI IN CLUSTERS IN BOTH AEROBIC AND ANAEROBIC BOTTLES CRITICAL RESULT CALLED TO, READ BACK BY AND VERIFIED WITH: PHARMD JENNY ZHOU [id number] 0800 BY J RAZZAK, MT    Culture (A)  Final    STAPHYLOCOCCUS AUREUS SUSCEPTIBILITIES TO FOLLOW Performed at [facility], [address] [place], [place] [address]    Report Status PENDING  Incomplete  Blood Culture ID Panel (Reflexed)     Status: Abnormal   Collection Time: 04/16/23  1:44 PM  Result Value Ref Range Status   Enterococcus faecalis NOT DETECTED NOT DETECTED Final   Enterococcus Faecium NOT DETECTED NOT DETECTED Final   Listeria monocytogenes NOT DETECTED NOT DETECTED Final   Staphylococcus species DETECTED (A) NOT DETECTED Final     Comment: CRITICAL RESULT CALLED TO, READ BACK BY AND VERIFIED WITH: PHARMD JENNY ZHOU [id number] 0800 BY J RAZZAK, MT    Staphylococcus aureus (BCID) DETECTED (A) NOT DETECTED Final    Comment: Methicillin (oxacillin)-resistant Staphylococcus aureus (MRSA). MRSA is predictably resistant to beta-lactam antibiotics (except ceftaroline). Preferred therapy is vancomycin unless clinically contraindicated. Patient requires contact precautions if  hospitalized. CRITICAL RESULT CALLED TO, READ BACK BY AND VERIFIED WITH: PHARMD JENNY ZHOU [id number] 0800 BY J RAZZAK, MT    Staphylococcus epidermidis NOT DETECTED NOT DETECTED Final   Staphylococcus lugdunensis NOT DETECTED NOT DETECTED Final   Streptococcus species NOT DETECTED NOT DETECTED Final   Streptococcus agalactiae NOT DETECTED NOT DETECTED Final   Streptococcus pneumoniae NOT DETECTED NOT DETECTED Final   Streptococcus pyogenes NOT DETECTED NOT DETECTED Final   A.calcoaceticus-baumannii NOT DETECTED NOT DETECTED Final   Bacteroides fragilis NOT DETECTED NOT DETECTED Final   Enterobacterales NOT DETECTED NOT DETECTED Final   Enterobacter cloacae complex NOT DETECTED NOT DETECTED Final   Escherichia coli NOT DETECTED NOT DETECTED Final   Klebsiella aerogenes NOT DETECTED NOT DETECTED Final   Klebsiella oxytoca NOT DETECTED NOT DETECTED Final   Klebsiella pneumoniae NOT DETECTED NOT DETECTED Final   Proteus species NOT DETECTED NOT DETECTED Final   Salmonella species NOT DETECTED NOT DETECTED Final   Serratia marcescens NOT DETECTED NOT DETECTED Final   Haemophilus influenzae NOT DETECTED NOT DETECTED Final   Neisseria meningitidis NOT DETECTED NOT DETECTED Final   Pseudomonas aeruginosa NOT DETECTED NOT DETECTED Final   Stenotrophomonas maltophilia NOT DETECTED NOT DETECTED Final   Candida albicans NOT DETECTED NOT DETECTED Final   Candida auris NOT DETECTED NOT DETECTED Final   Candida glabrata NOT DETECTED NOT DETECTED Final   Candida  krusei NOT DETECTED NOT DETECTED Final   Candida parapsilosis NOT DETECTED NOT DETECTED Final   Candida tropicalis NOT DETECTED NOT DETECTED Final   Cryptococcus neoformans/gattii NOT DETECTED NOT DETECTED Final   Meth resistant mecA/C and MREJ DETECTED (A) NOT DETECTED Final    Comment: CRITICAL RESULT CALLED TO, READ BACK BY AND VERIFIED WITH: PHARMD JENNY ZHOU [id number] BY Berline Chough, MT Performed at [facility], [address] [place], [place] [address]   Culture, blood (Routine X 2) w Reflex to ID Panel     Status: Abnormal (Preliminary result)   Collection Time: 04/16/23  3:08 PM   Specimen: BLOOD LEFT ARM  Result Value Ref Range Status   Specimen Description BLOOD LEFT ARM  Final   Special Requests   Final    BOTTLES DRAWN AEROBIC AND ANAEROBIC Blood Culture results may not be optimal due to an inadequate volume of blood received in culture bottles   Culture  Setup Time   Final    GRAM POSITIVE COCCI IN CLUSTERS IN BOTH AEROBIC AND ANAEROBIC BOTTLES CRITICAL VALUE NOTED.  VALUE IS CONSISTENT WITH PREVIOUSLY REPORTED AND CALLED VALUE. Performed at [facility], [address], [place], [place] [address]    Culture STAPHYLOCOCCUS AUREUS (A)  Final   Report Status PENDING  Incomplete  Culture, blood (Routine X 2) w Reflex to ID Panel     Status: None (Preliminary result)   Collection Time: 04/18/23  7:08 AM   Specimen: BLOOD  Result Value Ref Range Status   Specimen Description BLOOD RIGHT ANTECUBITAL  Final   Special Requests   Final    BOTTLES DRAWN AEROBIC AND ANAEROBIC Blood Culture results may not be optimal due to an inadequate volume of blood received in culture bottles Performed at [facility], [address] [place], [place] [address]    Culture PENDING  Incomplete   Report Status PENDING  Incomplete    Radiology Studies: No results found.  Scheduled Meds:  docusate sodium  100 mg Oral BID   folic acid  1 mg Oral  Daily   ketorolac  15 mg  Intravenous Q6H   lactulose  20 g Oral TID   metoprolol tartrate  25 mg Oral BID   senna  1 tablet Oral BID   thiamine  100 mg Oral Daily   Continuous Infusions:  DAPTOmycin     diltiazem (CARDIZEM) infusion 5 mg/hr (04/18/23 1413)     LOS: 2 days    Time spent: 35 mins    Willeen Niece, MD [facility]   If 7PM-7AM, please contact night-coverage

## 2023-04-18 NOTE — Progress Notes (Signed)
[facility]    Date of Admission:  04/16/2023   Total days of antibiotics 3 on dapto   ID: Jonathan Ibarra is a 58 y.o. male hx of cirrhosis s/p TIPS with mrsa bacteremia with concern of right knee septis arthritis with HW Principal Problem:   Septic joint of right knee joint (HCC) Active Problems:   Thrombocytopenia (HCC)   Essential hypertension   Hepatic cirrhosis (HCC)   Polysubstance abuse (HCC)   S/P TIPS (transjugular intrahepatic portosystemic shunt)   Atrial fibrillation, chronic (HCC)   MRSA bacteremia   Effusion of right knee joint    Subjective: Afebrile, less nightsweats but still having knee pain. Just returned from CT knee  Medications:   docusate sodium  100 mg Oral BID   folic acid  1 mg Oral Daily   ketorolac  15 mg Intravenous Q6H   lactulose  20 g Oral TID   metoprolol tartrate  25 mg Oral BID   senna  1 tablet Oral BID   thiamine  100 mg Oral Daily    Objective: Vital signs in last 24 hours: Temp:  [97.5 F (36.4 C)-98.2 F (36.8 C)] 97.5 F (36.4 C) (02/17 0815) Pulse Rate:  [66-84] 84 (02/17 0815) Resp:  [14-20] 18 (02/17 0815) BP: (88-117)/(56-80) 117/71 (02/17 0815) SpO2:  [90 %-99 %] 95 % (02/17 0815) Physical Exam  Constitutional: He is oriented to person, place, and time. He appears well-developed and well-nourished. No distress.  HENT:  Mouth/Throat: Oropharynx is clear and moist. No oropharyngeal exudate.  Cardiovascular: Normal rate, regular rhythm and normal heart sounds. Exam reveals no gallop and no friction rub.  No murmur heard.  Pulmonary/Chest: Effort normal and breath sounds normal. No respiratory distress. He has no wheezes.  Abdominal: Soft. Bowel sounds are normal. He exhibits no distension. There is no tenderness.  Ext: right knee small eschar on distal incision. Warm to touch Neurological: He is alert and oriented to person, place, and time.  Skin: Skin is warm and dry. No rash noted. No  erythema.  Psychiatric: He has a normal mood and affect. His behavior is normal.    Lab Results Recent Labs    04/17/23 0252 04/18/23 0706  WBC 13.7* 8.0  HGB 14.0 12.9*  HCT 44.2 39.6  NA 134* 130*  K 4.2 4.2  CL 99 101  CO2 26 22  BUN 14 26*  CREATININE 1.13 0.97   Liver Panel Recent Labs    04/17/23 0252  PROT 6.6  ALBUMIN 2.9*  AST 19  ALT 18  ALKPHOS 91  BILITOT 4.1*   Sedimentation Rate Recent Labs    04/18/23 0706  ESRSEDRATE 26*   C-Reactive Protein Recent Labs    04/16/23 1917 04/18/23 0706  CRP 17.9* 19.1*    Microbiology: Blood cx 2/17: NGTD Blood cx 2/15 : MRSA Studies/Results: DG Knee 2 Views Right Result Date: 04/16/2023 CLINICAL DATA:  fall, pain EXAM: RIGHT KNEE - 1-2 VIEW COMPARISON:  January 12, 2023, February 03, 2023 FINDINGS: Osteopenia. Status post ORIF of the proximal tibia for a tibial plateau fracture. Revisualization of a comminuted appearance of the tibial plateau, similar comparison to prior CT. Similar appearance of a lucent area along the tibial plateau compared to prior CT. Large joint effusion. Soft tissue edema. IMPRESSION: 1. Status post ORIF of the proximal tibia for a tibial plateau fracture. Revisualization of a comminuted appearance of the tibial plateau, similar in comparison to prior CT. Similar appearance  of a lucency abutting the tibial plateau compared to prior. 2. Large joint effusion.  Soft tissue edema. Electronically Signed   By: Meda Klinefelter M.D.   On: 04/16/2023 14:06     Assessment/Plan: MRSA bacteremia, concerning for right knee septic arthritis/OM = continue on daptomycin at 8mg /kg daily. Since going to OR tomorrow, please get repeat aerobic culture.  Repeat blood cx drawn this morning. Await to see if negative at 48hr before getting picc line  Please get TEE if feasible, otherwise TTE.  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 Continue on maintenance meds, such as lactulose  Continue on contact isolation for MRSA  Sherlon Nied B. Drue Second MD MPH [facility] [phone]      [facility] Pager: [phone]  04/18/2023, 1:12 PM

## 2023-04-18 NOTE — Progress Notes (Signed)
Orthopaedic Trauma Progress Note  SUBJECTIVE: Notes severe pain in the right knee this morning.  Pain medication not helping.  Patient notes he is taking oxycodone 10 mg at home, I will adjust his dose on the [facility] today.  Continues have notable swelling to the knee.  Notes no change in swelling or level of pain since starting IV antibiotics.  Difficulty mobilizing due to pain.  No chest pain. No SOB. No nausea/vomiting. No other complaints.  No fever or chills over the last 24 hours  OBJECTIVE:  Vitals:   04/17/23 2300 04/18/23 0346  BP: 98/68 111/73  Pulse: 67 70  Resp: 16 19  Temp: 98.2 F (36.8 C) 98.1 F (36.7 C)  SpO2: 98% 92%    General: Sitting up in bed, no acute distress.  Obviously in pain. Respiratory: No increased work of breathing.  Right lower extremity: Incision appears stable.  No active drainage.  No wound dehiscence.  With pressure I am able to express a small amount of purulent fluid from the distal portion of the incision just under the eschar.  Calf is swollen but compressible.  Tolerates gentle ankle dorsiflexion/plantarflexion.  Notable pain with any motion of the knee.  Endorses sensation all aspects of the foot. + DP pulse  IMAGING: Repeat imaging of the right knee on 04/16/2023 shows fixation in place with no further signs of any hardware failure or loosening.  Notable lateral joint depression.  LABS:  Results for orders placed or performed during the hospital encounter of 04/16/23 (from the past 24 hours)  Lactic acid, plasma     Status: Abnormal   Collection Time: 04/17/23 10:37 AM  Result Value Ref Range   Lactic Acid, Venous 2.9 (HH) 0.5 - 1.9 mmol/L  C-reactive protein     Status: Abnormal   Collection Time: 04/18/23  7:06 AM  Result Value Ref Range   CRP 19.1 (H) <1.0 mg/dL  CBC with Differential/Platelet     Status: Abnormal   Collection Time: 04/18/23  7:06 AM  Result Value Ref Range   WBC 8.0 4.0 - 10.5 K/uL   RBC 4.28 4.22 - 5.81 MIL/uL    Hemoglobin 12.9 (L) 13.0 - 17.0 g/dL   HCT 40.9 81.1 - 91.4 %   MCV 92.5 80.0 - 100.0 fL   MCH 30.1 26.0 - 34.0 pg   MCHC 32.6 30.0 - 36.0 g/dL   RDW 78.2 95.6 - 21.3 %   Platelets 133 (L) 150 - 400 K/uL   nRBC 0.0 0.0 - 0.2 %   Neutrophils Relative % 72 %   Neutro Abs 5.8 1.7 - 7.7 K/uL   Lymphocytes Relative 7 %   Lymphs Abs 0.5 (L) 0.7 - 4.0 K/uL   Monocytes Relative 17 %   Monocytes Absolute 1.4 (H) 0.1 - 1.0 K/uL   Eosinophils Relative 3 %   Eosinophils Absolute 0.2 0.0 - 0.5 K/uL   Basophils Relative 1 %   Basophils Absolute 0.0 0.0 - 0.1 K/uL   Immature Granulocytes 0 %   Abs Immature Granulocytes 0.03 0.00 - 0.07 K/uL  Magnesium     Status: None   Collection Time: 04/18/23  7:06 AM  Result Value Ref Range   Magnesium 2.1 1.7 - 2.4 mg/dL  Basic metabolic panel     Status: Abnormal   Collection Time: 04/18/23  7:06 AM  Result Value Ref Range   Sodium 130 (L) 135 - 145 mmol/L   Potassium 4.2 3.5 - 5.1 mmol/L   Chloride  101 98 - 111 mmol/L   CO2 22 22 - 32 mmol/L   Glucose, Bld 116 (H) 70 - 99 mg/dL   BUN 26 (H) 6 - 20 mg/dL   Creatinine, Ser 7.82 0.61 - 1.24 mg/dL   Calcium 8.7 (L) 8.9 - 10.3 mg/dL   GFR, Estimated >95 >62 mL/min   Anion gap 7 5 - 15  Phosphorus     Status: None   Collection Time: 04/18/23  7:06 AM  Result Value Ref Range   Phosphorus 2.6 2.5 - 4.6 mg/dL  Culture, blood (Routine X 2) w Reflex to ID Panel     Status: None (Preliminary result)   Collection Time: 04/18/23  7:08 AM   Specimen: BLOOD  Result Value Ref Range   Specimen Description BLOOD RIGHT ANTECUBITAL    Special Requests      BOTTLES DRAWN AEROBIC AND ANAEROBIC Blood Culture results may not be optimal due to an inadequate volume of blood received in culture bottles Performed at [facility], [address], [place], [place] [address]    Culture PENDING    Report Status PENDING     ASSESSMENT: Jonathan Ibarra is a 58 y.o. male status post ORIF right tibial plateau  October 2024 with subsequent infection requiring multiple I&D's  CV/Blood loss: Hemoglobin 12.9 this morning. Hemodynamically stable  PLAN: Weightbearing: WBAT RLE ROM: Motion as tolerated Incisional and dressing care: No dressings needed at this point. Showering: Okay to shower Orthopedic device(s): None  Pain management: Adjusted oxycodone dose today.  Have added Toradol 15 mg every 6 hours.  Continue Robaxin 750 milligrams 4 times daily VTE prophylaxis:  SCDs ID: Per infectious disease team Foley/Lines:  No foley, KVO IVFs Impediments to Fracture Healing: Infection Dispo: Plan for OR on Wednesday, 04/20/2023 for I&D of the right knee and hardware removal.  Will obtain CT scan of the right knee today to evaluate the joint.  Please keep patient n.p.o. at midnight 04/20/2023.  Appreciate assistance from medicine and infectious disease teams   Follow - up plan:  To be determined   Contact information:  Truitt Merle MD, Thyra Breed PA-C. After hours and holidays please check [email or web address] for group call information for [facility]   Thompson Caul, PA-C [phone] (office) [email or web address]

## 2023-04-18 NOTE — Plan of Care (Signed)
  Problem: Education: Goal: Knowledge of General Education information will improve Description: Including pain rating scale, medication(s)/side effects and non-pharmacologic comfort measures Outcome: Progressing   Problem: Clinical Measurements: Goal: Respiratory complications will improve Outcome: Progressing Goal: Cardiovascular complication will be avoided Outcome: Progressing   Problem: Nutrition: Goal: Adequate nutrition will be maintained Outcome: Progressing   Problem: Elimination: Goal: Will not experience complications related to bowel motility Outcome: Progressing Goal: Will not experience complications related to urinary retention Outcome: Progressing

## 2023-04-18 NOTE — Progress Notes (Signed)
  Echocardiogram 2D Echocardiogram has been performed.  Jonathan Ibarra 04/18/2023, 3:28 PM

## 2023-04-18 NOTE — TOC Initial Note (Signed)
Transition of Care [facility]) - Initial/Assessment Note    Patient Details  Name: Jonathan Ibarra MRN: [id number] Date of Birth: 1965-12-17  Transition of Care [facility]) CM/SW Contact:    Ronny Bacon, RN Phone Number: 04/18/2023, 12:56 PM  Clinical Narrative:    Patient from home with complaint of fall causing injury to knee. Patient expected to go to OR for I&D and removal of hardware on Wednesday. Pt on IV Daptomycin and IV Cardizem. TOC will continue to follow for discharge needs.           Expected Discharge Plan: Home w Home Health Services Barriers to Discharge: Continued Medical Work up   Patient Goals and CMS Choice            Expected Discharge Plan and Services   Discharge Planning Services: CM Consult   Living arrangements for the past 2 months: Single Family Home                                      Prior Living Arrangements/Services Living arrangements for the past 2 months: Single Family Home   Patient language and need for interpreter reviewed:: Yes            Current home services:  (Last used [facility] 02/22/2023) Criminal Activity/Legal Involvement Pertinent to Current Situation/Hospitalization: No - Comment as needed  Activities of Daily Living   ADL Screening (condition at time of admission) Independently performs ADLs?: Yes (appropriate for developmental age) Is the patient deaf or have difficulty hearing?: No Does the patient have difficulty seeing, even when wearing glasses/contacts?: Yes Does the patient have difficulty concentrating, remembering, or making decisions?: No  Permission Sought/Granted                  Emotional Assessment              Admission diagnosis:  Effusion of right knee joint [M25.461] Septic joint of right knee joint (HCC) [M00.9] Patient Active Problem List   Diagnosis Date Noted   MRSA bacteremia 04/17/2023   Effusion of right knee joint 04/17/2023   Septic joint of right knee joint  (HCC) 04/16/2023   Atrial fibrillation, chronic (HCC) 04/16/2023   Wound infection complicating hardware, subsequent encounter 02/04/2023   Drainage from wound 02/04/2023   Knee effusion, right 12/28/2022   Septic arthritis of knee, right (HCC) 12/28/2022   Fall from bridge, initial encounter 12/13/2022   Atrial fibrillation with rapid ventricular response (HCC) 10/01/2022   SIRS without infection with organ dysfunction (HCC) 10/01/2022   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 [place] [address] Phone: [phone] Fax: [phone]     Social Drivers of Health (SDOH) Social History: SDOH Screenings   Food Insecurity: Food Insecurity Present (04/17/2023)  Housing: Low Risk  (04/17/2023)  Transportation Needs: Unmet Transportation Needs (04/17/2023)  Utilities: Not At Risk (04/17/2023)  Financial Resource Strain: Medium Risk (01/07/2020)   Received from [facility], [facility]  Social Connections: Unknown (07/05/2021)   Received from [facility], [facility]  Stress: No Stress Concern Present (01/07/2020)   [facility]  Tobacco Use: High Risk (01/12/2023)   SDOH Interventions:     Readmission Risk Interventions     No data to display

## 2023-04-19 DIAGNOSIS — I4819 Other persistent atrial fibrillation: Secondary | ICD-10-CM | POA: Diagnosis not present

## 2023-04-19 DIAGNOSIS — M00061 Staphylococcal arthritis, right knee: Secondary | ICD-10-CM | POA: Diagnosis not present

## 2023-04-19 DIAGNOSIS — B9562 Methicillin resistant Staphylococcus aureus infection as the cause of diseases classified elsewhere: Secondary | ICD-10-CM | POA: Diagnosis not present

## 2023-04-19 DIAGNOSIS — R7881 Bacteremia: Secondary | ICD-10-CM | POA: Diagnosis not present

## 2023-04-19 LAB — CULTURE, BLOOD (ROUTINE X 2)

## 2023-04-19 LAB — CK: Total CK: 14 U/L — ABNORMAL LOW (ref 49–397)

## 2023-04-19 NOTE — Progress Notes (Signed)
[facility]    Date of Admission:  04/16/2023   Total days of antibiotics 4          ID: Jonathan Ibarra is a 58 y.o. male with  MRSA bacteremia in the setting of right knee septic arthritis with HW involvement Principal Problem:   Septic joint of right knee joint (HCC) Active Problems:   Thrombocytopenia (HCC)   Essential hypertension   Hepatic cirrhosis (HCC)   Polysubstance abuse (HCC)   S/P TIPS (transjugular intrahepatic portosystemic shunt)   Atrial fibrillation, chronic (HCC)   MRSA bacteremia   Effusion of right knee joint    Subjective: Afebrile, feeling sleepy this morning due to poor sleep overnight. Occasional flutter of heart beat per patient  Medications:   diltiazem  60 mg Oral Q8H   docusate sodium  100 mg Oral BID   folic acid  1 mg Oral Daily   lactulose  20 g Oral TID   metoprolol tartrate  25 mg Oral BID   senna  1 tablet Oral BID   thiamine  100 mg Oral Daily    Objective: Vital signs in last 24 hours: Temp:  [97.4 F (36.3 C)-97.9 F (36.6 C)] 97.6 F (36.4 C) (02/18 1646) Pulse Rate:  [55-69] 66 (02/18 1646) Resp:  [10-21] 13 (02/18 1646) BP: (112-119)/(68-95) 116/91 (02/18 1646) SpO2:  [92 %-98 %] 92 % (02/18 1213)  Physical Exam  Constitutional: He is oriented to person, place, and time. He appears well-developed and well-nourished. No distress.  HENT:  Mouth/Throat: Oropharynx is clear and moist. No oropharyngeal exudate.  Cardiovascular: Normal rate, regular rhythm and normal heart sounds. Exam reveals no gallop and no friction rub.  No murmur heard.  Pulmonary/Chest: Effort normal and breath sounds normal. No respiratory distress. He has no wheezes.  Abdominal: Soft. Bowel sounds are normal. He exhibits no distension. There is no tenderness.  JY:NWGNF knee swelling Neurological: He is alert and oriented to person, place, and time.  Skin: Skin is warm and dry. No rash noted. No erythema.  Psychiatric:  He has a normal mood and affect. His behavior is normal.   Lab Results Recent Labs    04/17/23 0252 04/18/23 0706  WBC 13.7* 8.0  HGB 14.0 12.9*  HCT 44.2 39.6  NA 134* 130*  K 4.2 4.2  CL 99 101  CO2 26 22  BUN 14 26*  CREATININE 1.13 0.97   Liver Panel Recent Labs    04/17/23 0252  PROT 6.6  ALBUMIN 2.9*  AST 19  ALT 18  ALKPHOS 91  BILITOT 4.1*   Sedimentation Rate Recent Labs    04/18/23 0706  ESRSEDRATE 26*   C-Reactive Protein Recent Labs    04/16/23 1917 04/18/23 0706  CRP 17.9* 19.1*    Microbiology: Blood cx 215- MRSA Blood cx 2/17- NGTD Studies/Results: ECHOCARDIOGRAM COMPLETE Result Date: 04/18/2023    ECHOCARDIOGRAM REPORT   Patient Name:   Jonathan Ibarra Date of Exam: 04/18/2023 Medical Rec #:  [id number]          Height:       72.0 in Accession #:    [id number]         Weight:       260.6 lb Date of Birth:  1965-03-24         BSA:          2.384 m Patient Age:    14 years  BP:           117/71 mmHg Patient Gender: M                  HR:           56 bpm. Exam Location:  Inpatient Procedure: 2D Echo, Cardiac Doppler and Color Doppler (Both Spectral and Color            Flow Doppler were utilized during procedure). Indications:    Bacteremia  History:        Patient has prior history of Echocardiogram examinations, most                 recent 10/01/2022. Abnormal ECG, Arrythmias:Atrial Fibrillation,                 Signs/Symptoms:Bacteremia; Risk Factors:Hypertension.                 Polysbstance abuse.  Sonographer:    Sheralyn Boatman RDCS Referring Phys: [id number] CORNELIUS N VAN DAM  Sonographer Comments: Technically difficult study due to poor echo windows. Patient moving throughout exam. Patient nearly rolled off the bed. IMPRESSIONS  1. Left ventricular ejection fraction, by estimation, is 50 to 55%. The left ventricle has low normal function. The left ventricle demonstrates global hypokinesis. There is mild left ventricular hypertrophy of the  basal-septal segment. Left ventricular diastolic function could not be evaluated.  2. Right ventricular systolic function is normal. The right ventricular size is severely enlarged. There is normal pulmonary artery systolic pressure. The estimated right ventricular systolic pressure is 34.7 mmHg.  3. The mitral valve is abnormal. Mild mitral valve regurgitation. No evidence of mitral stenosis.  4. Tricuspid valve regurgitation is mild to moderate.  5. The aortic valve is tricuspid. There is moderate calcification of the aortic valve. There is moderate thickening of the aortic valve. Aortic valve regurgitation is trivial. Aortic valve sclerosis/calcification is present, without any evidence of aortic stenosis.  6. The inferior vena cava is dilated in size with <50% respiratory variability, suggesting right atrial pressure of 15 mmHg. Conclusion(s)/Recommendation(s): No evidence of valvular vegetations on this transthoracic echocardiogram but the mitral valve leaflets are thickened with some calcification. Consider a transesophageal echocardiogram to exclude infective endocarditis if clinically indicated. FINDINGS  Left Ventricle: Left ventricular ejection fraction, by estimation, is 50 to 55%. The left ventricle has low normal function. The left ventricle demonstrates global hypokinesis. Strain imaging was not performed. The left ventricular internal cavity size was normal in size. There is mild left ventricular hypertrophy of the basal-septal segment. Left ventricular diastolic function could not be evaluated. Right Ventricle: The right ventricular size is severely enlarged. No increase in right ventricular wall thickness. Right ventricular systolic function is normal. There is normal pulmonary artery systolic pressure. The tricuspid regurgitant velocity is 2.22 m/s, and with an assumed right atrial pressure of 15 mmHg, the estimated right ventricular systolic pressure is 34.7 mmHg. Left Atrium: Left atrial size was  normal in size. Right Atrium: Right atrial size was normal in size. Pericardium: There is no evidence of pericardial effusion. Mitral Valve: The mitral valve is abnormal. There is mild calcification of the mitral valve leaflet(s). Mild mitral valve regurgitation. No evidence of mitral valve stenosis. Tricuspid Valve: The tricuspid valve is normal in structure. Tricuspid valve regurgitation is mild to moderate. No evidence of tricuspid stenosis. Aortic Valve: The aortic valve is tricuspid. There is moderate calcification of the aortic valve. There is moderate thickening of the aortic valve.  Aortic valve regurgitation is trivial. Aortic valve sclerosis/calcification is present, without any evidence of aortic stenosis. Pulmonic Valve: The pulmonic valve was normal in structure. Pulmonic valve regurgitation is not visualized. No evidence of pulmonic stenosis. Aorta: The aortic root is normal in size and structure. Venous: The inferior vena cava is dilated in size with less than 50% respiratory variability, suggesting right atrial pressure of 15 mmHg. IAS/Shunts: No atrial level shunt detected by color flow Doppler. Additional Comments: 3D imaging was not performed.  LEFT VENTRICLE PLAX 2D LVIDd:         4.70 cm      Diastology LVIDs:         3.30 cm      LV e' medial:    6.64 cm/s LV PW:         1.10 cm      LV E/e' medial:  17.5 LV IVS:        1.20 cm      LV e' lateral:   10.40 cm/s LVOT diam:     2.40 cm      LV E/e' lateral: 11.2 LV SV:         91 LV SV Index:   38 LVOT Area:     4.52 cm  LV Volumes (MOD) LV vol d, MOD A2C: 133.0 ml LV vol d, MOD A4C: 107.5 ml LV vol s, MOD A2C: 77.3 ml LV vol s, MOD A4C: 57.1 ml LV SV MOD A2C:     55.7 ml LV SV MOD A4C:     107.5 ml LV SV MOD BP:      54.4 ml RIGHT VENTRICLE            IVC RV S prime:     7.72 cm/s  IVC diam: 2.90 cm TAPSE (M-mode): 1.7 cm LEFT ATRIUM             Index        RIGHT ATRIUM           Index LA diam:        4.80 cm 2.01 cm/m   RA Area:     20.60 cm  LA Vol (A2C):   47.8 ml 20.05 ml/m  RA Volume:   65.10 ml  27.31 ml/m LA Vol (A4C):   67.9 ml 28.48 ml/m LA Biplane Vol: 57.6 ml 24.16 ml/m  AORTIC VALVE LVOT Vmax:   95.40 cm/s LVOT Vmean:  60.300 cm/s LVOT VTI:    0.201 m  AORTA Ao Root diam: 3.50 cm Ao Asc diam:  3.50 cm MITRAL VALVE                TRICUSPID VALVE MV Area (PHT): 4.31 cm     TR Peak grad:   19.7 mmHg MV Decel Time: 176 msec     TR Vmax:        222.00 cm/s MV E velocity: 116.00 cm/s                             SHUNTS                             Systemic VTI:  0.20 m                             Systemic Diam: 2.40 cm Armanda Magic MD Electronically signed  by Armanda Magic MD Signature Date/Time: 04/18/2023/3:45:13 PM    Final    CT KNEE RIGHT WO CONTRAST Result Date: 04/18/2023 CLINICAL DATA:  Comminuted fracture of the proximal right tibia with ORIF 12/13/2022 and subsequent infection. Concern for osteomyelitis. EXAM: CT OF THE RIGHT KNEE WITHOUT CONTRAST TECHNIQUE: Multidetector CT imaging of the right knee was performed according to the standard protocol. Multiplanar CT image reconstructions were also generated. RADIATION DOSE REDUCTION: This exam was performed according to the departmental dose-optimization program which includes automated exposure control, adjustment of the mA and/or kV according to patient size and/or use of iterative reconstruction technique. COMPARISON:  Radiographs 04/16/2023 and 01/12/2023. CT 02/03/2023 and 12/12/2022. FINDINGS: Bones/Joint/Cartilage Stable postsurgical changes from lateral tibial plate and screw fixation four comminuted fractures of both tibial plateaus. The hardware appears intact without loosening. The underlying fractures of both tibial plateaus are grossly stable, with persistent mild depression of the articular surface and no definite osseous bridging. No bone destruction is identified to suggest osteomyelitis. The distal femur, patella and proximal fibula appear intact. A moderate size knee  joint effusion appears slightly enlarged compared with the most recent prior study. Ligaments Suboptimally assessed by CT. Muscles and Tendons The extensor mechanism is intact. No intramuscular fluid collection, focal atrophy or unexpected foreign body identified. Soft tissues Interval increased soft tissue swelling around the knee, especially anterolaterally in the proximal lower leg. No focal fluid collection, unexpected foreign body or soft tissue emphysema demonstrated. IMPRESSION: 1. Stable postsurgical changes from ORIF of comminuted fractures of both tibial plateaus. No CT evidence of hardware complication or osteomyelitis. Findings consistent with delayed osseous healing. 2. Interval increased soft tissue swelling around the knee, especially anterolaterally in the proximal lower leg. No focal fluid collection, unexpected foreign body or soft tissue emphysema demonstrated. 3. Moderate size knee joint effusion appears slightly enlarged compared with the most recent prior study, nonspecific. Electronically Signed   By: Carey Bullocks M.D.   On: 04/18/2023 15:38     Assessment/Plan: MRSA bacteremia with probable right knee septic arthritis with HW involvement - plan for washout and hw removal tomorrow - on 2/20 then have TEE to rule out endocarditis - continue on daptomycin for the timebeing - leukcytosis resolved - will check cr to see dapto dosing is appropriate  Continue on contact isolation with mrsa  [facility] Pager: [phone]  04/19/2023, 5:31 PM

## 2023-04-19 NOTE — H&P (View-Only) (Signed)
Rounding Note    Patient Name: Jonathan Ibarra Date of Encounter: 04/19/2023  [facility] Cardiologist: New   Subjective   No acute overnight events. He is still somnolent and falls asleep easily. He does report he can feel is heart beating irregularly but denies any heart racing. No chest pain or shortness of breath.  Inpatient Medications    Scheduled Meds:  diltiazem  60 mg Oral Q8H   docusate sodium  100 mg Oral BID   folic acid  1 mg Oral Daily   ketorolac  15 mg Intravenous Q6H   lactulose  20 g Oral TID   metoprolol tartrate  25 mg Oral BID   senna  1 tablet Oral BID   thiamine  100 mg Oral Daily   Continuous Infusions:  DAPTOmycin 800 mg (04/18/23 1839)   PRN Meds: hydrALAZINE, methocarbamol, ondansetron **OR** ondansetron (ZOFRAN) IV, oxyCODONE   Vital Signs    Vitals:   04/19/23 0327 04/19/23 0731 04/19/23 0734 04/19/23 1014  BP: 112/75  115/81 119/84  Pulse: (!) 55  69   Resp: 10     Temp: (!) 97.4 F (36.3 C)  97.9 F (36.6 C) 97.6 F (36.4 C)  TempSrc: Oral Oral Axillary Oral  SpO2: 94%  95%   Weight:      Height:        Intake/Output Summary (Last 24 hours) at 04/19/2023 1047 Last data filed at 04/19/2023 0328 Gross per 24 hour  Intake 480 ml  Output 400 ml  Net 80 ml      04/16/2023    6:19 PM 01/12/2023    9:39 AM 01/02/2023   12:47 PM  Last 3 Weights  Weight (lbs) 260 lb 9.3 oz 260 lb 272 lb 7.8 oz  Weight (kg) 118.2 kg 117.935 kg 123.6 kg      Telemetry    Atrial fibrillation with rates in the 60s to 70s. - Personally Reviewed  ECG    No new ECG tracing today. - Personally Reviewed  Physical Exam   GEN: No acute distress.   Neck: No JVD. Cardiac: Irregularly irregular rhythm with normal rate. No murmurs, rubs, or gallops.  Respiratory: Clear to auscultation bilaterally. No wheezes, rhonchi, or rales. MS: No significant lower extremity edema; No deformity. Neuro:  No focal deficits. Psych: Somnolent.  Labs     High Sensitivity Troponin:   Recent Labs  Lab 04/16/23 2357 04/17/23 0252  TROPONINIHS 14 17     Chemistry Recent Labs  Lab 04/16/23 1439 04/17/23 0252 04/18/23 0706  NA 135 134* 130*  K 3.6 4.2 4.2  CL 101 99 101  CO2 23 26 22   GLUCOSE 95 102* 116*  BUN 11 14 26*  CREATININE 0.90 1.13 0.97  CALCIUM 9.1 9.0 8.7*  MG  --  1.6* 2.1  PROT  --  6.6  --   ALBUMIN  --  2.9*  --   AST  --  19  --   ALT  --  18  --   ALKPHOS  --  91  --   BILITOT  --  4.1*  --   GFRNONAA >60 >60 >60  ANIONGAP 11 9 7     Lipids No results for input(s): "CHOL", "TRIG", "HDL", "LABVLDL", "LDLCALC", "CHOLHDL" in the last 168 hours.  Hematology Recent Labs  Lab 04/16/23 1439 04/17/23 0252 04/18/23 0706  WBC 10.9* 13.7* 8.0  RBC 4.74 4.73 4.28  HGB 14.0 14.0 12.9*  HCT 42.6 44.2 39.6  MCV 89.9 93.4 92.5  MCH 29.5 29.6 30.1  MCHC 32.9 31.7 32.6  RDW 15.1 15.5 15.3  PLT 124* 164 133*   Thyroid  Recent Labs  Lab 04/16/23 2357  TSH 4.815*    BNPNo results for input(s): "BNP", "PROBNP" in the last 168 hours.  DDimer No results for input(s): "DDIMER" in the last 168 hours.   Radiology    ECHOCARDIOGRAM COMPLETE Result Date: 04/18/2023    ECHOCARDIOGRAM REPORT   Patient Name:   [name] Date of Exam: 04/18/2023 Medical Rec #:  [id number]          Height:       72.0 in Accession #:    [id number]         Weight:       260.6 lb Date of Birth:  August 03, 1965         BSA:          2.384 m Patient Age:    58 years           BP:           117/71 mmHg Patient Gender: M                  HR:           56 bpm. Exam Location:  Inpatient Procedure: 2D Echo, Cardiac Doppler and Color Doppler (Both Spectral and Color            Flow Doppler were utilized during procedure). Indications:    Bacteremia  History:        Patient has prior history of Echocardiogram examinations, most                 recent 10/01/2022. Abnormal ECG, Arrythmias:Atrial Fibrillation,                 Signs/Symptoms:Bacteremia;  Risk Factors:Hypertension.                 Polysbstance abuse.  Sonographer:    Sheralyn Boatman RDCS Referring Phys: [id number] CORNELIUS N VAN DAM  Sonographer Comments: Technically difficult study due to poor echo windows. Patient moving throughout exam. Patient nearly rolled off the bed. IMPRESSIONS  1. Left ventricular ejection fraction, by estimation, is 50 to 55%. The left ventricle has low normal function. The left ventricle demonstrates global hypokinesis. There is mild left ventricular hypertrophy of the basal-septal segment. Left ventricular diastolic function could not be evaluated.  2. Right ventricular systolic function is normal. The right ventricular size is severely enlarged. There is normal pulmonary artery systolic pressure. The estimated right ventricular systolic pressure is 34.7 mmHg.  3. The mitral valve is abnormal. Mild mitral valve regurgitation. No evidence of mitral stenosis.  4. Tricuspid valve regurgitation is mild to moderate.  5. The aortic valve is tricuspid. There is moderate calcification of the aortic valve. There is moderate thickening of the aortic valve. Aortic valve regurgitation is trivial. Aortic valve sclerosis/calcification is present, without any evidence of aortic stenosis.  6. The inferior vena cava is dilated in size with <50% respiratory variability, suggesting right atrial pressure of 15 mmHg. Conclusion(s)/Recommendation(s): No evidence of valvular vegetations on this transthoracic echocardiogram but the mitral valve leaflets are thickened with some calcification. Consider a transesophageal echocardiogram to exclude infective endocarditis if clinically indicated. FINDINGS  Left Ventricle: Left ventricular ejection fraction, by estimation, is 50 to 55%. The left ventricle has low normal function. The left ventricle demonstrates global hypokinesis. Strain imaging was not performed.  The left ventricular internal cavity size was normal in size. There is mild left ventricular  hypertrophy of the basal-septal segment. Left ventricular diastolic function could not be evaluated. Right Ventricle: The right ventricular size is severely enlarged. No increase in right ventricular wall thickness. Right ventricular systolic function is normal. There is normal pulmonary artery systolic pressure. The tricuspid regurgitant velocity is 2.22 m/s, and with an assumed right atrial pressure of 15 mmHg, the estimated right ventricular systolic pressure is 34.7 mmHg. Left Atrium: Left atrial size was normal in size. Right Atrium: Right atrial size was normal in size. Pericardium: There is no evidence of pericardial effusion. Mitral Valve: The mitral valve is abnormal. There is mild calcification of the mitral valve leaflet(s). Mild mitral valve regurgitation. No evidence of mitral valve stenosis. Tricuspid Valve: The tricuspid valve is normal in structure. Tricuspid valve regurgitation is mild to moderate. No evidence of tricuspid stenosis. Aortic Valve: The aortic valve is tricuspid. There is moderate calcification of the aortic valve. There is moderate thickening of the aortic valve. Aortic valve regurgitation is trivial. Aortic valve sclerosis/calcification is present, without any evidence of aortic stenosis. Pulmonic Valve: The pulmonic valve was normal in structure. Pulmonic valve regurgitation is not visualized. No evidence of pulmonic stenosis. Aorta: The aortic root is normal in size and structure. Venous: The inferior vena cava is dilated in size with less than 50% respiratory variability, suggesting right atrial pressure of 15 mmHg. IAS/Shunts: No atrial level shunt detected by color flow Doppler. Additional Comments: 3D imaging was not performed.  LEFT VENTRICLE PLAX 2D LVIDd:         4.70 cm      Diastology LVIDs:         3.30 cm      LV e' medial:    6.64 cm/s LV PW:         1.10 cm      LV E/e' medial:  17.5 LV IVS:        1.20 cm      LV e' lateral:   10.40 cm/s LVOT diam:     2.40 cm      LV  E/e' lateral: 11.2 LV SV:         91 LV SV Index:   38 LVOT Area:     4.52 cm  LV Volumes (MOD) LV vol d, MOD A2C: 133.0 ml LV vol d, MOD A4C: 107.5 ml LV vol s, MOD A2C: 77.3 ml LV vol s, MOD A4C: 57.1 ml LV SV MOD A2C:     55.7 ml LV SV MOD A4C:     107.5 ml LV SV MOD BP:      54.4 ml RIGHT VENTRICLE            IVC RV S prime:     7.72 cm/s  IVC diam: 2.90 cm TAPSE (M-mode): 1.7 cm LEFT ATRIUM             Index        RIGHT ATRIUM           Index LA diam:        4.80 cm 2.01 cm/m   RA Area:     20.60 cm LA Vol (A2C):   47.8 ml 20.05 ml/m  RA Volume:   65.10 ml  27.31 ml/m LA Vol (A4C):   67.9 ml 28.48 ml/m LA Biplane Vol: 57.6 ml 24.16 ml/m  AORTIC VALVE LVOT Vmax:   95.40 cm/s LVOT Vmean:  60.300  cm/s LVOT VTI:    0.201 m  AORTA Ao Root diam: 3.50 cm Ao Asc diam:  3.50 cm MITRAL VALVE                TRICUSPID VALVE MV Area (PHT): 4.31 cm     TR Peak grad:   19.7 mmHg MV Decel Time: 176 msec     TR Vmax:        222.00 cm/s MV E velocity: 116.00 cm/s                             SHUNTS                             Systemic VTI:  0.20 m                             Systemic Diam: 2.40 cm Armanda Magic MD Electronically signed by Armanda Magic MD Signature Date/Time: 04/18/2023/3:45:13 PM    Final    CT KNEE RIGHT WO CONTRAST Result Date: 04/18/2023 CLINICAL DATA:  Comminuted fracture of the proximal right tibia with ORIF 12/13/2022 and subsequent infection. Concern for osteomyelitis. EXAM: CT OF THE RIGHT KNEE WITHOUT CONTRAST TECHNIQUE: Multidetector CT imaging of the right knee was performed according to the standard protocol. Multiplanar CT image reconstructions were also generated. RADIATION DOSE REDUCTION: This exam was performed according to the departmental dose-optimization program which includes automated exposure control, adjustment of the mA and/or kV according to patient size and/or use of iterative reconstruction technique. COMPARISON:  Radiographs 04/16/2023 and 01/12/2023. CT 02/03/2023 and  12/12/2022. FINDINGS: Bones/Joint/Cartilage Stable postsurgical changes from lateral tibial plate and screw fixation four comminuted fractures of both tibial plateaus. The hardware appears intact without loosening. The underlying fractures of both tibial plateaus are grossly stable, with persistent mild depression of the articular surface and no definite osseous bridging. No bone destruction is identified to suggest osteomyelitis. The distal femur, patella and proximal fibula appear intact. A moderate size knee joint effusion appears slightly enlarged compared with the most recent prior study. Ligaments Suboptimally assessed by CT. Muscles and Tendons The extensor mechanism is intact. No intramuscular fluid collection, focal atrophy or unexpected foreign body identified. Soft tissues Interval increased soft tissue swelling around the knee, especially anterolaterally in the proximal lower leg. No focal fluid collection, unexpected foreign body or soft tissue emphysema demonstrated. IMPRESSION: 1. Stable postsurgical changes from ORIF of comminuted fractures of both tibial plateaus. No CT evidence of hardware complication or osteomyelitis. Findings consistent with delayed osseous healing. 2. Interval increased soft tissue swelling around the knee, especially anterolaterally in the proximal lower leg. No focal fluid collection, unexpected foreign body or soft tissue emphysema demonstrated. 3. Moderate size knee joint effusion appears slightly enlarged compared with the most recent prior study, nonspecific. Electronically Signed   By: Carey Bullocks M.D.   On: 04/18/2023 15:38    Cardiac Studies   Echocardiogram 04/18/2023: Impressions: 1. Left ventricular ejection fraction, by estimation, is 50 to 55%. The  left ventricle has low normal function. The left ventricle demonstrates  global hypokinesis. There is mild left ventricular hypertrophy of the  basal-septal segment. Left ventricular  diastolic function  could not be evaluated.   2. Right ventricular systolic function is normal. The right ventricular  size is severely enlarged. There is normal pulmonary artery systolic  pressure.  The estimated right ventricular systolic pressure is 34.7 mmHg.   3. The mitral valve is abnormal. Mild mitral valve regurgitation. No  evidence of mitral stenosis.   4. Tricuspid valve regurgitation is mild to moderate.   5. The aortic valve is tricuspid. There is moderate calcification of the  aortic valve. There is moderate thickening of the aortic valve. Aortic  valve regurgitation is trivial. Aortic valve sclerosis/calcification is  present, without any evidence of  aortic stenosis.   6. The inferior vena cava is dilated in size with <50% respiratory  variability, suggesting right atrial pressure of 15 mmHg.    Patient Profile     58 y.o. male with a history of paroxysmal atrial fibrillation diagnosed during admission in 11/2022  (not on anticoagulation given low CHA2DS2-VASc  score and polysubstance abuse), paroxysmal SVT during admission in 09/2022, hypertension, hepatic cirrhosis s/p TIPS, right tibial plateau fracture in 11/2022 with subsequent infection requiring multiple I&Ds, and polysubstance abuse (alcohol, THC, methamphetamines, and cocaine) who was admitted on 04/16/2023 for septic arthritis of right right knee. Cardiology was consulted for further evaluation of atrial fibrillation.  Assessment & Plan    Atrial Fibrillation with RVR Initial diagnosed in 11/2022 during an admission for right knee infection. He was not felt to be a candidate for anticoagulation at that time given polysubstance abuse but CHA2DS2-VASc also 1. Patient now admitted for septic arthritis of right knee and noted to be in atrial fibrillation with RVR. Echo this admission shows LVEF of 50-55% with global hypokinesis and severely enlarged RV but normal RV function. - Rate controlled. - He was initially on IV Diltiazem but has now  been transitioned to PO medications. Continue PO Diltiazem 60mg  three times daily and Lopressor 25mg  twice daily. Can consolidate to long acting Diltiazem and Toprol-XL prior to discharge. - CHA2DS2-VASc = 1 (HTN). Not felt to be a candidate for long-term anticoagulation given polysubstance abuse and other comorbidities.  Hypertension  History of hypertension.  - BP well controlled here and even soft at times.  - Continue Diltiazem and Lopressor as above.  Septic Arthritis MRSA Bacteremia Admitted with septic arthritis of right knee. Blood cultures positive for MRSA. He is scheduled for hardware removal tomorrow. ID has also requested a TEE which is also currently scheduled for tomorrow. I tried to consent patient. I discussed risk and benefits; however, he is still somnolent and was not able to repeat back to me the reason for doing the study, the basics of how we do it, or the potential risks. I do not think he is able to consent for this procedure at this time. He states he does not have a healthcare power of attorney or anyone who makes decisions for himself if he cannot. Suspect  we will need to hold off on TEE for now. Will discuss with MD.  Otherwise, per primary team: - Hepatic cirrhosis s/p TIPS - Polysubstance abuse   For questions or updates, please contact  [facility] Please consult [email or web address] for contact info under        Signed, Corrin Parker, PA-C  04/19/2023, 10:47 AM     Attending Note:   The patient was seen and examined.  Agree with assessment and plan as noted above.  Changes made to the above note as needed.  Patient seen and independently examined with Marjie Skiff, PA .   We discussed all aspects of the encounter. I agree with the assessment and plan as stated above.   Atrial fibrillation:  His rate is well-controlled at this point.  No need to urgently cardiovert him.  He is not on anticoagulation because of his noncompliance and  polysubstance abuse.  He now has a septic arthritis of his right knee replacement. He is scheduled to have removal of hardware tomorrow.  He is at low risk for this procedure.  2.  Septic arthritis: He has MRSA bacteremia.  Infectious disease has requested a transesophageal echo to rule out endocarditis.  Will try to get this Thursday or Friday.  He has been very lethargic so it has been difficult to try to consent him for this procedure.       I have spent a total of 40 minutes with patient reviewing hospital  notes , telemetry, EKGs, labs and examining patient as well as establishing an assessment and plan that was discussed with the patient.  > 50% of time was spent in direct patient care.    Vesta Mixer, Montez Hageman., MD, [facility] 04/19/2023, 2:00 PM [address]  [address] Office [phone] Pager [phone]

## 2023-04-19 NOTE — Progress Notes (Signed)
PROGRESS NOTE    Jonathan Ibarra  MWU:[id number] DOB: May 29, 1965 DOA: 04/16/2023 PCP: Candi Leash, MD   Brief Narrative:  This 58 yrs old male with PMH significant for Liver cirrhosis, s/p TIPS procedure, Essential hypertension, polysubstance abuse, Atrial Fibrillation , recent right plateau fracture requiring ORIF on 12/13/22.  Patient was hospitalized from 12/27/22  -02/04/2023 with purulent discharge from the right knee, thought to have post operative septic arthritis. Patient was managed with IV ertapenem. Patient was evaluated by infectious disease and patient was discharged on omadacycline.Patient was advised to follow-up with orthopedics and infectious diseases.Patient had undergone irrigation and debridement of right knee three times as an outpatient. Recently has not followed up with orthopedics , now presented with worsening pain and swelling of right knee associated with purulent drainage. Right knee x-ray showed  Large joint effusion.  Soft tissue edema.  Orthopedics and infectious disease consulted.   Assessment & Plan:   Principal Problem:   Septic joint of right knee joint (HCC) Active Problems:   Thrombocytopenia (HCC)   Essential hypertension   Hepatic cirrhosis (HCC)   Polysubstance abuse (HCC)   S/P TIPS (transjugular intrahepatic portosystemic shunt)   Atrial fibrillation, chronic (HCC)   MRSA bacteremia   Effusion of right knee joint  Right Knee Effusion: Hx.of Septic arthritis of right knee: MRSA Bacteremia: This patient presented with worsening pain and swelling of the right knee following fall 2 days ago. Patient has complicated postoperative course following right plateau fracture. Patient underwent incision and drainage thrice as an outpatient prior to this hospitalization. He was treated with IV ertapenem and subsequently was discharged on omadacycline on last admission. Xray showed large joint effusion. EDP discussed the case with Dr. Jena Gauss, who  recommended washout / I  & D on Wednesday. Continue empiric antibiotics (Vancomycin and Zosyn ) Blood Cultures grew MRSA.  Antibiotics changed to daptomycin. Adequate pain control with Dilaudid as needed. ID > Needs PICC line but repeat blood cultures need to be negative for 48 hours. ID recommended TEE tentatively scheduled for Wednesday.   Atrial fibrillation: Heart rate controlled on metoprolol and Cardizem. Patient is not on anticoagulation, CHA2DS2-VASc score 1. Cardiology on board.  Scheduled for TEE tentatively tomorrow.  Liver cirrhosis status post TIPS: Appears well compensated. Volume status relatively stable. Continue lactulose. Ammonia level slightly elevated 48.   Polysubstance abuse: Patient acknowledges ongoing THC use and remote cocaine use. Obtain urine drug screen.  Ongoing counseling.   Obesity: Estimated body mass index is 35.26 kg/m as calculated from the following:   Height as of this encounter: 6' (1.829 m).   Weight as of this encounter: 117.9 kg.  Diet and exercise discussed in detail.   Thrombocytopenia: Stable.  likely due to liver cirrhosis.  DVT prophylaxis:SCDs Code Status: Full code Family Communication: No family at bed side. Disposition Plan:    Status is: Inpatient Remains inpatient appropriate because: Severity of illness.  Consultants:  Orthopedics Infectious diseases  Procedures:  None Antimicrobials:  Anti-infectives (From admission, onward)    Start     Dose/Rate Route Frequency Ordered Stop   04/18/23 1400  DAPTOmycin (CUBICIN) 800 mg in sodium chloride 0.9 % IVPB       Placed in "And" Linked Group   8 mg/kg  93.8 kg (Adjusted) 132 mL/hr over 30 Minutes Intravenous Daily 04/18/23 0754     04/18/23 0500  piperacillin-tazobactam (ZOSYN) IVPB 3.375 g  Status:  Discontinued        3.375 g 12.5 mL/hr over  240 Minutes Intravenous Every 8 hours 04/17/23 1002 04/17/23 1026   04/17/23 2300  piperacillin-tazobactam (ZOSYN) IVPB  3.375 g  Status:  Discontinued       Placed in "Followed by" Linked Group   3.375 g 12.5 mL/hr over 240 Minutes Intravenous Every 8 hours 04/16/23 1609 04/17/23 0007   04/17/23 2100  DAPTOmycin (CUBICIN) 800 mg in sodium chloride 0.9 % IVPB  Status:  Discontinued        8 mg/kg  93.8 kg (Adjusted) 132 mL/hr over 30 Minutes Intravenous Daily 04/17/23 1249 04/18/23 0754   04/17/23 1930  vancomycin (VANCOCIN) IVPB 1000 mg/200 mL premix  Status:  Discontinued        1,000 mg 200 mL/hr over 60 Minutes Intravenous Every 12 hours 04/17/23 1016 04/18/23 0748   04/17/23 0730  vancomycin (VANCOREADY) IVPB 1250 mg/250 mL  Status:  Discontinued        1,250 mg 166.7 mL/hr over 90 Minutes Intravenous Every 12 hours 04/16/23 1846 04/17/23 1016   04/17/23 0500  vancomycin (VANCOREADY) IVPB 1250 mg/250 mL  Status:  Discontinued        1,250 mg 166.7 mL/hr over 90 Minutes Intravenous Every 12 hours 04/16/23 1628 04/16/23 1846   04/17/23 0100  ertapenem (INVANZ) 1 g in sodium chloride 0.9 % 100 mL IVPB        1 g 200 mL/hr over 30 Minutes Intravenous  Once 04/17/23 0008 04/17/23 0516   04/16/23 1615  vancomycin (VANCOREADY) IVPB 2000 mg/400 mL        2,000 mg 200 mL/hr over 120 Minutes Intravenous  Once 04/16/23 1609 04/16/23 2319   04/16/23 1615  piperacillin-tazobactam (ZOSYN) IVPB 3.375 g       Placed in "Followed by" Linked Group   3.375 g 100 mL/hr over 30 Minutes Intravenous  Once 04/16/23 1609 04/16/23 1717   04/16/23 1600  ceFAZolin (ANCEF) IVPB 1 g/50 mL premix        1 g 100 mL/hr over 30 Minutes Intravenous  Once 04/16/23 1554 04/16/23 1807      Subjective: Patient was seen and examined at bedside. Overnight events noted.   Patient reports pain is reasonably controlled, right knee is wrapped with a compression stocking.  Objective: Vitals:   04/19/23 0731 04/19/23 0734 04/19/23 1014 04/19/23 1213  BP:  115/81 119/84 117/85  Pulse:  69 (!) 56 69  Resp:   (!) 21 12  Temp:  97.9 F  (36.6 C) 97.6 F (36.4 C) 97.6 F (36.4 C)  TempSrc: Oral Axillary Oral Oral  SpO2:  95%    Weight:      Height:        Intake/Output Summary (Last 24 hours) at 04/19/2023 1424 Last data filed at 04/19/2023 1245 Gross per 24 hour  Intake --  Output 600 ml  Net -600 ml   Filed Weights   04/16/23 1819  Weight: 118.2 kg    Examination:  General exam: Appears comfortable, deconditioned, remains in a lot of pain. Respiratory system: CTA Bilaterally.  Respiratory effort normal.  RR 15 Cardiovascular system: S1 & S2 heard, RRR. No JVD, murmurs, rubs, gallops or clicks.  Gastrointestinal system: Abdomen is non distended, soft and non tender.Normal bowel sounds heard. Central nervous system: Alert and oriented x 2. No focal neurological deficits. Extremities: Right knee swollen, tender, erythematous. Skin: No rashes, lesions or ulcers Psychiatry: Judgement and insight appear normal. Mood & affect appropriate.     Data Reviewed: I have personally reviewed following labs  and imaging studies  CBC: Recent Labs  Lab 04/16/23 1439 04/17/23 0252 04/18/23 0706  WBC 10.9* 13.7* 8.0  NEUTROABS 8.8*  --  5.8  HGB 14.0 14.0 12.9*  HCT 42.6 44.2 39.6  MCV 89.9 93.4 92.5  PLT 124* 164 133*   Basic Metabolic Panel: Recent Labs  Lab 04/16/23 1439 04/17/23 0252 04/18/23 0706  NA 135 134* 130*  K 3.6 4.2 4.2  CL 101 99 101  CO2 23 26 22   GLUCOSE 95 102* 116*  BUN 11 14 26*  CREATININE 0.90 1.13 0.97  CALCIUM 9.1 9.0 8.7*  MG  --  1.6* 2.1  PHOS  --  3.2 2.6   GFR: Estimated Creatinine Clearance: 112.8 mL/min (by C-G formula based on SCr of 0.97 mg/dL). Liver Function Tests: Recent Labs  Lab 04/17/23 0252  AST 19  ALT 18  ALKPHOS 91  BILITOT 4.1*  PROT 6.6  ALBUMIN 2.9*   No results for input(s): "LIPASE", "AMYLASE" in the last 168 hours. Recent Labs  Lab 04/16/23 1453  AMMONIA 49*   Coagulation Profile: No results for input(s): "INR", "PROTIME" in the last  168 hours. Cardiac Enzymes: Recent Labs  Lab 04/19/23 0721  CKTOTAL 14*   BNP (last 3 results) No results for input(s): "PROBNP" in the last 8760 hours. HbA1C: No results for input(s): "HGBA1C" in the last 72 hours. CBG: Recent Labs  Lab 04/16/23 2331  GLUCAP 101*   Lipid Profile: No results for input(s): "CHOL", "HDL", "LDLCALC", "TRIG", "CHOLHDL", "LDLDIRECT" in the last 72 hours. Thyroid Function Tests: Recent Labs    04/16/23 2357  TSH 4.815*   Anemia Panel: No results for input(s): "VITAMINB12", "FOLATE", "FERRITIN", "TIBC", "IRON", "RETICCTPCT" in the last 72 hours. Sepsis Labs: Recent Labs  Lab 04/16/23 1459 04/17/23 0252 04/17/23 1037  LATICACIDVEN 2.1* 2.1* 2.9*    Recent Results (from the past 240 hours)  Blood culture (routine x 2)     Status: Abnormal   Collection Time: 04/16/23  1:44 PM   Specimen: BLOOD  Result Value Ref Range Status   Specimen Description BLOOD SITE NOT SPECIFIED  Final   Special Requests   Final    BOTTLES DRAWN AEROBIC AND ANAEROBIC Blood Culture adequate volume   Culture  Setup Time   Final    GRAM POSITIVE COCCI IN CLUSTERS IN BOTH AEROBIC AND ANAEROBIC BOTTLES CRITICAL RESULT CALLED TO, READ BACK BY AND VERIFIED WITH: Valarie Merino ZHOU [id number] BY Berline Chough, [place] Performed at [facility], [address], [place], [place] [address]    Culture METHICILLIN RESISTANT STAPHYLOCOCCUS AUREUS (A)  Final   Report Status 04/19/2023 FINAL  Final   Organism ID, Bacteria METHICILLIN RESISTANT STAPHYLOCOCCUS AUREUS  Final      Susceptibility   Methicillin resistant staphylococcus aureus - MIC*    CIPROFLOXACIN >=8 RESISTANT Resistant     ERYTHROMYCIN >=8 RESISTANT Resistant     GENTAMICIN <=0.5 SENSITIVE Sensitive     OXACILLIN >=4 RESISTANT Resistant     TETRACYCLINE >=16 RESISTANT Resistant     VANCOMYCIN <=0.5 SENSITIVE Sensitive     TRIMETH/SULFA 160 RESISTANT Resistant     CLINDAMYCIN <=0.25 SENSITIVE Sensitive      RIFAMPIN <=0.5 SENSITIVE Sensitive     Inducible Clindamycin NEGATIVE Sensitive     LINEZOLID 2 SENSITIVE Sensitive     * METHICILLIN RESISTANT STAPHYLOCOCCUS AUREUS  Blood Culture ID Panel (Reflexed)     Status: Abnormal   Collection Time: 04/16/23  1:44 PM  Result Value Ref Range Status   Enterococcus faecalis NOT DETECTED NOT DETECTED Final   Enterococcus Faecium NOT DETECTED NOT DETECTED Final   Listeria monocytogenes NOT DETECTED NOT DETECTED Final   Staphylococcus species DETECTED (A) NOT DETECTED Final    Comment: CRITICAL RESULT CALLED TO, READ BACK BY AND VERIFIED WITH: PHARMD JENNY ZHOU [phone] 0800 BY J RAZZAK, MT    Staphylococcus aureus (BCID) DETECTED (A) NOT DETECTED Final    Comment: Methicillin (oxacillin)-resistant Staphylococcus aureus (MRSA). MRSA is predictably resistant to beta-lactam antibiotics (except ceftaroline). Preferred therapy is vancomycin unless clinically contraindicated. Patient requires contact precautions if  hospitalized. CRITICAL RESULT CALLED TO, READ BACK BY AND VERIFIED WITH: PHARMD JENNY ZHOU [phone] 0800 BY J RAZZAK, MT    Staphylococcus epidermidis NOT DETECTED NOT DETECTED Final   Staphylococcus lugdunensis NOT DETECTED NOT DETECTED Final   Streptococcus species NOT DETECTED NOT DETECTED Final   Streptococcus agalactiae NOT DETECTED NOT DETECTED Final   Streptococcus pneumoniae NOT DETECTED NOT DETECTED Final   Streptococcus pyogenes NOT DETECTED NOT DETECTED Final   A.calcoaceticus-baumannii NOT DETECTED NOT DETECTED Final   Bacteroides fragilis NOT DETECTED NOT DETECTED Final   Enterobacterales NOT DETECTED NOT DETECTED Final   Enterobacter cloacae complex NOT DETECTED NOT DETECTED Final   Escherichia coli NOT DETECTED NOT DETECTED Final   Klebsiella aerogenes NOT DETECTED NOT DETECTED Final   Klebsiella oxytoca NOT DETECTED NOT DETECTED Final   Klebsiella pneumoniae NOT DETECTED NOT DETECTED Final   Proteus species NOT DETECTED NOT  DETECTED Final   Salmonella species NOT DETECTED NOT DETECTED Final   Serratia marcescens NOT DETECTED NOT DETECTED Final   Haemophilus influenzae NOT DETECTED NOT DETECTED Final   Neisseria meningitidis NOT DETECTED NOT DETECTED Final   Pseudomonas aeruginosa NOT DETECTED NOT DETECTED Final   Stenotrophomonas maltophilia NOT DETECTED NOT DETECTED Final   Candida albicans NOT DETECTED NOT DETECTED Final   Candida auris NOT DETECTED NOT DETECTED Final   Candida glabrata NOT DETECTED NOT DETECTED Final   Candida krusei NOT DETECTED NOT DETECTED Final   Candida parapsilosis NOT DETECTED NOT DETECTED Final   Candida tropicalis NOT DETECTED NOT DETECTED Final   Cryptococcus neoformans/gattii NOT DETECTED NOT DETECTED Final   Meth resistant mecA/C and MREJ DETECTED (A) NOT DETECTED Final    Comment: CRITICAL RESULT CALLED TO, READ BACK BY AND VERIFIED WITH: PHARMD JENNY ZHOU [phone] BY Berline Chough, MT Performed at [facility], [address], [place], [place] [address]   Culture, blood (Routine X 2) w Reflex to ID Panel     Status: Abnormal   Collection Time: 04/16/23  3:08 PM   Specimen: BLOOD LEFT ARM  Result Value Ref Range Status   Specimen Description BLOOD LEFT ARM  Final   Special Requests   Final    BOTTLES DRAWN AEROBIC AND ANAEROBIC Blood Culture results may not be optimal due to an inadequate volume of blood received in culture bottles   Culture  Setup Time   Final    GRAM POSITIVE COCCI IN CLUSTERS IN BOTH AEROBIC AND ANAEROBIC BOTTLES CRITICAL VALUE NOTED.  VALUE IS CONSISTENT WITH PREVIOUSLY REPORTED AND CALLED VALUE.    Culture (A)  Final    STAPHYLOCOCCUS AUREUS SUSCEPTIBILITIES PERFORMED ON PREVIOUS CULTURE WITHIN THE LAST 5 DAYS. Performed at [facility], [address] [place], [place] [address]    Report Status 04/19/2023 FINAL  Final  Culture, blood (Routine X 2) w Reflex to ID Panel  Status: None (Preliminary result)   Collection Time: 04/18/23   7:06 AM   Specimen: BLOOD RIGHT HAND  Result Value Ref Range Status   Specimen Description BLOOD RIGHT HAND  Final   Special Requests   Final    BOTTLES DRAWN AEROBIC AND ANAEROBIC Blood Culture results may not be optimal due to an inadequate volume of blood received in culture bottles   Culture   Final    NO GROWTH 1 DAY Performed at [facility], [address] [place], [place] [address]    Report Status PENDING  Incomplete  Culture, blood (Routine X 2) w Reflex to ID Panel     Status: None (Preliminary result)   Collection Time: 04/18/23  7:08 AM   Specimen: BLOOD  Result Value Ref Range Status   Specimen Description BLOOD RIGHT ANTECUBITAL  Final   Special Requests   Final    BOTTLES DRAWN AEROBIC AND ANAEROBIC Blood Culture results may not be optimal due to an inadequate volume of blood received in culture bottles   Culture   Final    NO GROWTH 1 DAY Performed at [facility], [address], [place], [place] [address]    Report Status PENDING  Incomplete    Radiology Studies: ECHOCARDIOGRAM COMPLETE Result Date: 04/18/2023    ECHOCARDIOGRAM REPORT   Patient Name:   SHAYNE DIGUGLIELMO Date of Exam: 04/18/2023 Medical Rec #:  [id number]          Height:       72.0 in Accession #:    [id number]         Weight:       260.6 lb Date of Birth:  May 11, 1965         BSA:          2.384 m Patient Age:    57 years           BP:           117/71 mmHg Patient Gender: M                  HR:           56 bpm. Exam Location:  Inpatient Procedure: 2D Echo, Cardiac Doppler and Color Doppler (Both Spectral and Color            Flow Doppler were utilized during procedure). Indications:    Bacteremia  History:        Patient has prior history of Echocardiogram examinations, most                 recent 10/01/2022. Abnormal ECG, Arrythmias:Atrial Fibrillation,                 Signs/Symptoms:Bacteremia; Risk Factors:Hypertension.                 Polysbstance abuse.  Sonographer:    Sheralyn Boatman RDCS  Referring Phys: [id number] CORNELI[address] N VAN DAM  Sonographer Comments: Technically difficult study due to poor echo windows. Patient moving throughout exam. Patient nearly rolled off the bed. IMPRESSIONS  1. Left ventricular ejection fraction, by estimation, is 50 to 55%. The left ventricle has low normal function. The left ventricle demonstrates global hypokinesis. There is mild left ventricular hypertrophy of the basal-septal segment. Left ventricular diastolic function could not be evaluated.  2. Right ventricular systolic function is normal. The right ventricular size is severely enlarged. There is normal pulmonary artery systolic pressure. The estimated right ventricular systolic pressure is  34.7 mmHg.  3. The mitral valve is abnormal. Mild mitral valve regurgitation. No evidence of mitral stenosis.  4. Tricuspid valve regurgitation is mild to moderate.  5. The aortic valve is tricuspid. There is moderate calcification of the aortic valve. There is moderate thickening of the aortic valve. Aortic valve regurgitation is trivial. Aortic valve sclerosis/calcification is present, without any evidence of aortic stenosis.  6. The inferior vena cava is dilated in size with <50% respiratory variability, suggesting right atrial pressure of 15 mmHg. Conclusion(s)/Recommendation(s): No evidence of valvular vegetations on this transthoracic echocardiogram but the mitral valve leaflets are thickened with some calcification. Consider a transesophageal echocardiogram to exclude infective endocarditis if clinically indicated. FINDINGS  Left Ventricle: Left ventricular ejection fraction, by estimation, is 50 to 55%. The left ventricle has low normal function. The left ventricle demonstrates global hypokinesis. Strain imaging was not performed. The left ventricular internal cavity size was normal in size. There is mild left ventricular hypertrophy of the basal-septal segment. Left ventricular diastolic function could not be evaluated.  Right Ventricle: The right ventricular size is severely enlarged. No increase in right ventricular wall thickness. Right ventricular systolic function is normal. There is normal pulmonary artery systolic pressure. The tricuspid regurgitant velocity is 2.22 m/s, and with an assumed right atrial pressure of 15 mmHg, the estimated right ventricular systolic pressure is 34.7 mmHg. Left Atrium: Left atrial size was normal in size. Right Atrium: Right atrial size was normal in size. Pericardium: There is no evidence of pericardial effusion. Mitral Valve: The mitral valve is abnormal. There is mild calcification of the mitral valve leaflet(s). Mild mitral valve regurgitation. No evidence of mitral valve stenosis. Tricuspid Valve: The tricuspid valve is normal in structure. Tricuspid valve regurgitation is mild to moderate. No evidence of tricuspid stenosis. Aortic Valve: The aortic valve is tricuspid. There is moderate calcification of the aortic valve. There is moderate thickening of the aortic valve. Aortic valve regurgitation is trivial. Aortic valve sclerosis/calcification is present, without any evidence of aortic stenosis. Pulmonic Valve: The pulmonic valve was normal in structure. Pulmonic valve regurgitation is not visualized. No evidence of pulmonic stenosis. Aorta: The aortic root is normal in size and structure. Venous: The inferior vena cava is dilated in size with less than 50% respiratory variability, suggesting right atrial pressure of 15 mmHg. IAS/Shunts: No atrial level shunt detected by color flow Doppler. Additional Comments: 3D imaging was not performed.  LEFT VENTRICLE PLAX 2D LVIDd:         4.70 cm      Diastology LVIDs:         3.30 cm      LV e' medial:    6.64 cm/s LV PW:         1.10 cm      LV E/e' medial:  17.5 LV IVS:        1.20 cm      LV e' lateral:   10.40 cm/s LVOT diam:     2.40 cm      LV E/e' lateral: 11.2 LV SV:         91 LV SV Index:   38 LVOT Area:     4.52 cm  LV Volumes (MOD) LV  vol d, MOD A2C: 133.0 ml LV vol d, MOD A4C: 107.5 ml LV vol s, MOD A2C: 77.3 ml LV vol s, MOD A4C: 57.1 ml LV SV MOD A2C:     55.7 ml LV SV MOD A4C:     107.5  ml LV SV MOD BP:      54.4 ml RIGHT VENTRICLE            IVC RV S prime:     7.72 cm/s  IVC diam: 2.90 cm TAPSE (M-mode): 1.7 cm LEFT ATRIUM             Index        RIGHT ATRIUM           Index LA diam:        4.80 cm 2.01 cm/m   RA Area:     20.60 cm LA Vol (A2C):   47.8 ml 20.05 ml/m  RA Volume:   65.10 ml  27.31 ml/m LA Vol (A4C):   67.9 ml 28.48 ml/m LA Biplane Vol: 57.6 ml 24.16 ml/m  AORTIC VALVE LVOT Vmax:   95.40 cm/s LVOT Vmean:  60.300 cm/s LVOT VTI:    0.201 m  AORTA Ao Root diam: 3.50 cm Ao Asc diam:  3.50 cm MITRAL VALVE                TRICUSPID VALVE MV Area (PHT): 4.31 cm     TR Peak grad:   19.7 mmHg MV Decel Time: 176 msec     TR Vmax:        222.00 cm/s MV E velocity: 116.00 cm/s                             SHUNTS                             Systemic VTI:  0.20 m                             Systemic Diam: 2.40 cm Armanda Magic MD Electronically signed by Armanda Magic MD Signature Date/Time: 04/18/2023/3:45:13 PM    Final    CT KNEE RIGHT WO CONTRAST Result Date: 04/18/2023 CLINICAL DATA:  Comminuted fracture of the proximal right tibia with ORIF 12/13/2022 and subsequent infection. Concern for osteomyelitis. EXAM: CT OF THE RIGHT KNEE WITHOUT CONTRAST TECHNIQUE: Multidetector CT imaging of the right knee was performed according to the standard protocol. Multiplanar CT image reconstructions were also generated. RADIATION DOSE REDUCTION: This exam was performed according to the departmental dose-optimization program which includes automated exposure control, adjustment of the mA and/or kV according to patient size and/or use of iterative reconstruction technique. COMPARISON:  Radiographs 04/16/2023 and 01/12/2023. CT 02/03/2023 and 12/12/2022. FINDINGS: Bones/Joint/Cartilage Stable postsurgical changes from lateral tibial plate and  screw fixation four comminuted fractures of both tibial plateaus. The hardware appears intact without loosening. The underlying fractures of both tibial plateaus are grossly stable, with persistent mild depression of the articular surface and no definite osseous bridging. No bone destruction is identified to suggest osteomyelitis. The distal femur, patella and proximal fibula appear intact. A moderate size knee joint effusion appears slightly enlarged compared with the most recent prior study. Ligaments Suboptimally assessed by CT. Muscles and Tendons The extensor mechanism is intact. No intramuscular fluid collection, focal atrophy or unexpected foreign body identified. Soft tissues Interval increased soft tissue swelling around the knee, especially anterolaterally in the proximal lower leg. No focal fluid collection, unexpected foreign body or soft tissue emphysema demonstrated. IMPRESSION: 1. Stable postsurgical changes from ORIF of comminuted fractures of both tibial plateaus. No CT evidence of hardware  complication or osteomyelitis. Findings consistent with delayed osseous healing. 2. Interval increased soft tissue swelling around the knee, especially anterolaterally in the proximal lower leg. No focal fluid collection, unexpected foreign body or soft tissue emphysema demonstrated. 3. Moderate size knee joint effusion appears slightly enlarged compared with the most recent prior study, nonspecific. Electronically Signed   By: Carey Bullocks M.D.   On: 04/18/2023 15:38    Scheduled Meds:  diltiazem  60 mg Oral Q8H   docusate sodium  100 mg Oral BID   folic acid  1 mg Oral Daily   lactulose  20 g Oral TID   metoprolol tartrate  25 mg Oral BID   senna  1 tablet Oral BID   thiamine  100 mg Oral Daily   Continuous Infusions:  DAPTOmycin 800 mg (04/18/23 1839)     LOS: 3 days    Time spent: 35 mins    Willeen Niece, MD [facility]   If 7PM-7AM, please contact night-coverage

## 2023-04-19 NOTE — Plan of Care (Signed)

## 2023-04-19 NOTE — Anesthesia Preprocedure Evaluation (Signed)
Jonathan Evaluation  Patient identified by MRN, date of birth, ID band Patient awake    Reviewed: Allergy & Precautions, NPO status , Patient's Chart, lab work & pertinent test results  History of Jonathan Complications Negative for: history of anesthetic complications  Airway Mallampati: III  TM Distance: >3 FB Neck ROM: Full   Comment: Previous grade I view with MAC 4, easy mask with OPA Dental  (+) Poor Dentition, Missing, Dental Advisory Given Broken:   Pulmonary neg shortness of breath, neg sleep apnea, neg COPD, neg recent URI, Current Smoker and Patient abstained from smoking.   Pulmonary exam normal breath sounds clear to auscultation       Cardiovascular hypertension, Pt. on home beta blockers (-) angina (-) Past MI, (-) Cardiac Stents and (-) CABG + dysrhythmias Atrial Fibrillation and Supra Ventricular Tachycardia + Valvular Problems/Murmurs (mild MR, mild-to-moderate TR)  Rhythm:Irregular Rate:Normal  TTE 04/18/2023: IMPRESSIONS    1. Left ventricular ejection fraction, by estimation, is 50 to 55%. The  left ventricle has low normal function. The left ventricle demonstrates  global hypokinesis. There is mild left ventricular hypertrophy of the  basal-septal segment. Left ventricular  diastolic function could not be evaluated.   2. Right ventricular systolic function is normal. The right ventricular  size is severely enlarged. There is normal pulmonary artery systolic  pressure. The estimated right ventricular systolic pressure is 34.7 mmHg.   3. The mitral valve is abnormal. Mild mitral valve regurgitation. No  evidence of mitral stenosis.   4. Tricuspid valve regurgitation is mild to moderate.   5. The aortic valve is tricuspid. There is moderate calcification of the  aortic valve. There is moderate thickening of the aortic valve. Aortic  valve regurgitation is trivial. Aortic valve sclerosis/calcification is   present, without any evidence of  aortic stenosis.   6. The inferior vena cava is dilated in size with <50% respiratory  variability, suggesting right atrial pressure of 15 mmHg.     Neuro/Psych negative neurological ROS     GI/Hepatic negative GI ROS,,,(+) Cirrhosis  (s/p TIPS)    substance abuse  alcohol use, cocaine use and marijuana use  Endo/Other  negative endocrine ROS    Renal/GU negative Renal ROS     Musculoskeletal  (+) Arthritis ,    Abdominal  (+) + obese  Peds  Hematology negative hematology ROS (+) Lab Results      Component                Value               Date                      WBC                      8.0                 04/18/2023                HGB                      12.9 (L)            04/18/2023                HCT                      39.6  04/18/2023                MCV                      92.5                04/18/2023                PLT                      133 (L)             04/18/2023              Jonathan Other Findings 58 y.o. Ibarra with a history of paroxysmal atrial fibrillation diagnosed during admission in 11/2022  (not on anticoagulation given low CHA2DS2-VASc  score and polysubstance abuse), paroxysmal SVT during admission in 09/2022, hypertension, hepatic cirrhosis s/p TIPS, right tibial plateau fracture in 11/2022 with subsequent infection requiring multiple I&Ds, and polysubstance abuse (alcohol, THC, methamphetamines, and cocaine) who was admitted on 04/16/2023 for septic arthritis of right right knee.  Reproductive/Obstetrics                             Jonathan Physical Jonathan Plan  ASA: 4  Jonathan Plan: General   Post-op Pain Management:    Induction: Intravenous  PONV Risk Score and Plan: 1 and Ondansetron and Treatment may vary due to age or medical condition  Airway Management Planned: Oral ETT  Additional Equipment:   Intra-op Plan:   Post-operative Plan:  Extubation in OR  Informed Consent: I have reviewed the patients History and Physical, chart, labs and discussed the procedure including the risks, benefits and alternatives for the proposed Jonathan with the patient or authorized representative who has indicated his/her understanding and acceptance.     Dental advisory given  Plan Discussed with: CRNA and Anesthesiologist  Jonathan Plan Comments: (Risks of general Jonathan discussed including, but not limited to, sore throat, hoarse voice, chipped/damaged teeth, injury to vocal cords, nausea and vomiting, allergic reactions, lung infection, heart attack, stroke, and death. All questions answered. )       Jonathan Quick Evaluation

## 2023-04-19 NOTE — Progress Notes (Addendum)
Rounding Note    Patient Name: Jonathan Ibarra Date of Encounter: 04/19/2023  [facility] Cardiologist: New   Subjective   No acute overnight events. He is still somnolent and falls asleep easily. He does report he can feel is heart beating irregularly but denies any heart racing. No chest pain or shortness of breath.  Inpatient Medications    Scheduled Meds:  diltiazem  60 mg Oral Q8H   docusate sodium  100 mg Oral BID   folic acid  1 mg Oral Daily   ketorolac  15 mg Intravenous Q6H   lactulose  20 g Oral TID   metoprolol tartrate  25 mg Oral BID   senna  1 tablet Oral BID   thiamine  100 mg Oral Daily   Continuous Infusions:  DAPTOmycin 800 mg (04/18/23 1839)   PRN Meds: hydrALAZINE, methocarbamol, ondansetron **OR** ondansetron (ZOFRAN) IV, oxyCODONE   Vital Signs    Vitals:   04/19/23 0327 04/19/23 0731 04/19/23 0734 04/19/23 1014  BP: 112/75  115/81 119/84  Pulse: (!) 55  69   Resp: 10     Temp: (!) 97.4 F (36.3 C)  97.9 F (36.6 C) 97.6 F (36.4 C)  TempSrc: Oral Oral Axillary Oral  SpO2: 94%  95%   Weight:      Height:        Intake/Output Summary (Last 24 hours) at 04/19/2023 1047 Last data filed at 04/19/2023 0328 Gross per 24 hour  Intake 480 ml  Output 400 ml  Net 80 ml      04/16/2023    6:19 PM 01/12/2023    9:39 AM 01/02/2023   12:47 PM  Last 3 Weights  Weight (lbs) 260 lb 9.3 oz 260 lb 272 lb 7.8 oz  Weight (kg) 118.2 kg 117.935 kg 123.6 kg      Telemetry    Atrial fibrillation with rates in the 60s to 70s. - Personally Reviewed  ECG    No new ECG tracing today. - Personally Reviewed  Physical Exam   GEN: No acute distress.   Neck: No JVD. Cardiac: Irregularly irregular rhythm with normal rate. No murmurs, rubs, or gallops.  Respiratory: Clear to auscultation bilaterally. No wheezes, rhonchi, or rales. MS: No significant lower extremity edema; No deformity. Neuro:  No focal deficits. Psych: Somnolent.  Labs     High Sensitivity Troponin:   Recent Labs  Lab 04/16/23 2357 04/17/23 0252  TROPONINIHS 14 17     Chemistry Recent Labs  Lab 04/16/23 1439 04/17/23 0252 04/18/23 0706  NA 135 134* 130*  K 3.6 4.2 4.2  CL 101 99 101  CO2 23 26 22   GLUCOSE 95 102* 116*  BUN 11 14 26*  CREATININE 0.90 1.13 0.97  CALCIUM 9.1 9.0 8.7*  MG  --  1.6* 2.1  PROT  --  6.6  --   ALBUMIN  --  2.9*  --   AST  --  19  --   ALT  --  18  --   ALKPHOS  --  91  --   BILITOT  --  4.1*  --   GFRNONAA >60 >60 >60  ANIONGAP 11 9 7     Lipids No results for input(s): "CHOL", "TRIG", "HDL", "LABVLDL", "LDLCALC", "CHOLHDL" in the last 168 hours.  Hematology Recent Labs  Lab 04/16/23 1439 04/17/23 0252 04/18/23 0706  WBC 10.9* 13.7* 8.0  RBC 4.74 4.73 4.28  HGB 14.0 14.0 12.9*  HCT 42.6 44.2 39.6  MCV 89.9 93.4 92.5  MCH 29.5 29.6 30.1  MCHC 32.9 31.7 32.6  RDW 15.1 15.5 15.3  PLT 124* 164 133*   Thyroid  Recent Labs  Lab 04/16/23 2357  TSH 4.815*    BNPNo results for input(s): "BNP", "PROBNP" in the last 168 hours.  DDimer No results for input(s): "DDIMER" in the last 168 hours.   Radiology    ECHOCARDIOGRAM COMPLETE Result Date: 04/18/2023    ECHOCARDIOGRAM REPORT   Patient Name:   Jonathan Ibarra Date of Exam: 04/18/2023 Medical Rec #:  [id number]          Height:       72.0 in Accession #:    [id number]         Weight:       260.6 lb Date of Birth:  August 03, 1965         BSA:          2.384 m Patient Age:    57 years           BP:           117/71 mmHg Patient Gender: M                  HR:           56 bpm. Exam Location:  Inpatient Procedure: 2D Echo, Cardiac Doppler and Color Doppler (Both Spectral and Color            Flow Doppler were utilized during procedure). Indications:    Bacteremia  History:        Patient has prior history of Echocardiogram examinations, most                 recent 10/01/2022. Abnormal ECG, Arrythmias:Atrial Fibrillation,                 Signs/Symptoms:Bacteremia;  Risk Factors:Hypertension.                 Polysbstance abuse.  Sonographer:    Sheralyn Boatman RDCS Referring Phys: [id number] CORNELIUS N VAN DAM  Sonographer Comments: Technically difficult study due to poor echo windows. Patient moving throughout exam. Patient nearly rolled off the bed. IMPRESSIONS  1. Left ventricular ejection fraction, by estimation, is 50 to 55%. The left ventricle has low normal function. The left ventricle demonstrates global hypokinesis. There is mild left ventricular hypertrophy of the basal-septal segment. Left ventricular diastolic function could not be evaluated.  2. Right ventricular systolic function is normal. The right ventricular size is severely enlarged. There is normal pulmonary artery systolic pressure. The estimated right ventricular systolic pressure is 34.7 mmHg.  3. The mitral valve is abnormal. Mild mitral valve regurgitation. No evidence of mitral stenosis.  4. Tricuspid valve regurgitation is mild to moderate.  5. The aortic valve is tricuspid. There is moderate calcification of the aortic valve. There is moderate thickening of the aortic valve. Aortic valve regurgitation is trivial. Aortic valve sclerosis/calcification is present, without any evidence of aortic stenosis.  6. The inferior vena cava is dilated in size with <50% respiratory variability, suggesting right atrial pressure of 15 mmHg. Conclusion(s)/Recommendation(s): No evidence of valvular vegetations on this transthoracic echocardiogram but the mitral valve leaflets are thickened with some calcification. Consider a transesophageal echocardiogram to exclude infective endocarditis if clinically indicated. FINDINGS  Left Ventricle: Left ventricular ejection fraction, by estimation, is 50 to 55%. The left ventricle has low normal function. The left ventricle demonstrates global hypokinesis. Strain imaging was not performed.  The left ventricular internal cavity size was normal in size. There is mild left ventricular  hypertrophy of the basal-septal segment. Left ventricular diastolic function could not be evaluated. Right Ventricle: The right ventricular size is severely enlarged. No increase in right ventricular wall thickness. Right ventricular systolic function is normal. There is normal pulmonary artery systolic pressure. The tricuspid regurgitant velocity is 2.22 m/s, and with an assumed right atrial pressure of 15 mmHg, the estimated right ventricular systolic pressure is 34.7 mmHg. Left Atrium: Left atrial size was normal in size. Right Atrium: Right atrial size was normal in size. Pericardium: There is no evidence of pericardial effusion. Mitral Valve: The mitral valve is abnormal. There is mild calcification of the mitral valve leaflet(s). Mild mitral valve regurgitation. No evidence of mitral valve stenosis. Tricuspid Valve: The tricuspid valve is normal in structure. Tricuspid valve regurgitation is mild to moderate. No evidence of tricuspid stenosis. Aortic Valve: The aortic valve is tricuspid. There is moderate calcification of the aortic valve. There is moderate thickening of the aortic valve. Aortic valve regurgitation is trivial. Aortic valve sclerosis/calcification is present, without any evidence of aortic stenosis. Pulmonic Valve: The pulmonic valve was normal in structure. Pulmonic valve regurgitation is not visualized. No evidence of pulmonic stenosis. Aorta: The aortic root is normal in size and structure. Venous: The inferior vena cava is dilated in size with less than 50% respiratory variability, suggesting right atrial pressure of 15 mmHg. IAS/Shunts: No atrial level shunt detected by color flow Doppler. Additional Comments: 3D imaging was not performed.  LEFT VENTRICLE PLAX 2D LVIDd:         4.70 cm      Diastology LVIDs:         3.30 cm      LV e' medial:    6.64 cm/s LV PW:         1.10 cm      LV E/e' medial:  17.5 LV IVS:        1.20 cm      LV e' lateral:   10.40 cm/s LVOT diam:     2.40 cm      LV  E/e' lateral: 11.2 LV SV:         91 LV SV Index:   38 LVOT Area:     4.52 cm  LV Volumes (MOD) LV vol d, MOD A2C: 133.0 ml LV vol d, MOD A4C: 107.5 ml LV vol s, MOD A2C: 77.3 ml LV vol s, MOD A4C: 57.1 ml LV SV MOD A2C:     55.7 ml LV SV MOD A4C:     107.5 ml LV SV MOD BP:      54.4 ml RIGHT VENTRICLE            IVC RV S prime:     7.72 cm/s  IVC diam: 2.90 cm TAPSE (M-mode): 1.7 cm LEFT ATRIUM             Index        RIGHT ATRIUM           Index LA diam:        4.80 cm 2.01 cm/m   RA Area:     20.60 cm LA Vol (A2C):   47.8 ml 20.05 ml/m  RA Volume:   65.10 ml  27.31 ml/m LA Vol (A4C):   67.9 ml 28.48 ml/m LA Biplane Vol: 57.6 ml 24.16 ml/m  AORTIC VALVE LVOT Vmax:   95.40 cm/s LVOT Vmean:  60.300  cm/s LVOT VTI:    0.201 m  AORTA Ao Root diam: 3.50 cm Ao Asc diam:  3.50 cm MITRAL VALVE                TRICUSPID VALVE MV Area (PHT): 4.31 cm     TR Peak grad:   19.7 mmHg MV Decel Time: 176 msec     TR Vmax:        222.00 cm/s MV E velocity: 116.00 cm/s                             SHUNTS                             Systemic VTI:  0.20 m                             Systemic Diam: 2.40 cm Armanda Magic MD Electronically signed by Armanda Magic MD Signature Date/Time: 04/18/2023/3:45:13 PM    Final    CT KNEE RIGHT WO CONTRAST Result Date: 04/18/2023 CLINICAL DATA:  Comminuted fracture of the proximal right tibia with ORIF 12/13/2022 and subsequent infection. Concern for osteomyelitis. EXAM: CT OF THE RIGHT KNEE WITHOUT CONTRAST TECHNIQUE: Multidetector CT imaging of the right knee was performed according to the standard protocol. Multiplanar CT image reconstructions were also generated. RADIATION DOSE REDUCTION: This exam was performed according to the departmental dose-optimization program which includes automated exposure control, adjustment of the mA and/or kV according to patient size and/or use of iterative reconstruction technique. COMPARISON:  Radiographs 04/16/2023 and 01/12/2023. CT 02/03/2023 and  12/12/2022. FINDINGS: Bones/Joint/Cartilage Stable postsurgical changes from lateral tibial plate and screw fixation four comminuted fractures of both tibial plateaus. The hardware appears intact without loosening. The underlying fractures of both tibial plateaus are grossly stable, with persistent mild depression of the articular surface and no definite osseous bridging. No bone destruction is identified to suggest osteomyelitis. The distal femur, patella and proximal fibula appear intact. A moderate size knee joint effusion appears slightly enlarged compared with the most recent prior study. Ligaments Suboptimally assessed by CT. Muscles and Tendons The extensor mechanism is intact. No intramuscular fluid collection, focal atrophy or unexpected foreign body identified. Soft tissues Interval increased soft tissue swelling around the knee, especially anterolaterally in the proximal lower leg. No focal fluid collection, unexpected foreign body or soft tissue emphysema demonstrated. IMPRESSION: 1. Stable postsurgical changes from ORIF of comminuted fractures of both tibial plateaus. No CT evidence of hardware complication or osteomyelitis. Findings consistent with delayed osseous healing. 2. Interval increased soft tissue swelling around the knee, especially anterolaterally in the proximal lower leg. No focal fluid collection, unexpected foreign body or soft tissue emphysema demonstrated. 3. Moderate size knee joint effusion appears slightly enlarged compared with the most recent prior study, nonspecific. Electronically Signed   By: Carey Bullocks M.D.   On: 04/18/2023 15:38    Cardiac Studies   Echocardiogram 04/18/2023: Impressions: 1. Left ventricular ejection fraction, by estimation, is 50 to 55%. The  left ventricle has low normal function. The left ventricle demonstrates  global hypokinesis. There is mild left ventricular hypertrophy of the  basal-septal segment. Left ventricular  diastolic function  could not be evaluated.   2. Right ventricular systolic function is normal. The right ventricular  size is severely enlarged. There is normal pulmonary artery systolic  pressure.  The estimated right ventricular systolic pressure is 34.7 mmHg.   3. The mitral valve is abnormal. Mild mitral valve regurgitation. No  evidence of mitral stenosis.   4. Tricuspid valve regurgitation is mild to moderate.   5. The aortic valve is tricuspid. There is moderate calcification of the  aortic valve. There is moderate thickening of the aortic valve. Aortic  valve regurgitation is trivial. Aortic valve sclerosis/calcification is  present, without any evidence of  aortic stenosis.   6. The inferior vena cava is dilated in size with <50% respiratory  variability, suggesting right atrial pressure of 15 mmHg.    Patient Profile     58 y.o. male with a history of paroxysmal atrial fibrillation diagnosed during admission in 11/2022  (not on anticoagulation given low CHA2DS2-VASc  score and polysubstance abuse), paroxysmal SVT during admission in 09/2022, hypertension, hepatic cirrhosis s/p TIPS, right tibial plateau fracture in 11/2022 with subsequent infection requiring multiple I&Ds, and polysubstance abuse (alcohol, THC, methamphetamines, and cocaine) who was admitted on 04/16/2023 for septic arthritis of right right knee. Cardiology was consulted for further evaluation of atrial fibrillation.  Assessment & Plan    Atrial Fibrillation with RVR Initial diagnosed in 11/2022 during an admission for right knee infection. He was not felt to be a candidate for anticoagulation at that time given polysubstance abuse but CHA2DS2-VASc also 1. Patient now admitted for septic arthritis of right knee and noted to be in atrial fibrillation with RVR. Echo this admission shows LVEF of 50-55% with global hypokinesis and severely enlarged RV but normal RV function. - Rate controlled. - He was initially on IV Diltiazem but has now  been transitioned to PO medications. Continue PO Diltiazem 60mg  three times daily and Lopressor 25mg  twice daily. Can consolidate to long acting Diltiazem and Toprol-XL prior to discharge. - CHA2DS2-VASc = 1 (HTN). Not felt to be a candidate for long-term anticoagulation given polysubstance abuse and other comorbidities.  Hypertension  History of hypertension.  - BP well controlled here and even soft at times.  - Continue Diltiazem and Lopressor as above.  Septic Arthritis MRSA Bacteremia Admitted with septic arthritis of right knee. Blood cultures positive for MRSA. He is scheduled for hardware removal tomorrow. ID has also requested a TEE which is also currently scheduled for tomorrow. I tried to consent patient. I discussed risk and benefits; however, he is still somnolent and was not able to repeat back to me the reason for doing the study, the basics of how we do it, or the potential risks. I do not think he is able to consent for this procedure at this time. He states he does not have a healthcare power of attorney or anyone who makes decisions for himself if he cannot. Suspect  we will need to hold off on TEE for now. Will discuss with MD.  Otherwise, per primary team: - Hepatic cirrhosis s/p TIPS - Polysubstance abuse   For questions or updates, please contact  [facility] Please consult [email or web address] for contact info under        Signed, Corrin Parker, PA-C  04/19/2023, 10:47 AM     Attending Note:   The patient was seen and examined.  Agree with assessment and plan as noted above.  Changes made to the above note as needed.  Patient seen and independently examined with Marjie Skiff, PA .   We discussed all aspects of the encounter. I agree with the assessment and plan as stated above.   Atrial fibrillation:  His rate is well-controlled at this point.  No need to urgently cardiovert him.  He is not on anticoagulation because of his noncompliance and  polysubstance abuse.  He now has a septic arthritis of his right knee replacement. He is scheduled to have removal of hardware tomorrow.  He is at low risk for this procedure.  2.  Septic arthritis: He has MRSA bacteremia.  Infectious disease has requested a transesophageal echo to rule out endocarditis.  Will try to get this Thursday or Friday.  He has been very lethargic so it has been difficult to try to consent him for this procedure.       I have spent a total of 40 minutes with patient reviewing hospital  notes , telemetry, EKGs, labs and examining patient as well as establishing an assessment and plan that was discussed with the patient.  > 50% of time was spent in direct patient care.    Vesta Mixer, Montez Hageman., MD, [facility] 04/19/2023, 2:00 PM [address]  [address] Office [phone] Pager [phone]

## 2023-04-20 ENCOUNTER — Encounter (HOSPITAL_COMMUNITY)
Admission: EM | Disposition: A | Discharge status: Home / Self Care | Payer: Self-pay | Source: Home / Self Care | Attending: Internal Medicine

## 2023-04-20 ENCOUNTER — Inpatient Hospital Stay (HOSPITAL_COMMUNITY): Payer: Medicaid Other

## 2023-04-20 ENCOUNTER — Inpatient Hospital Stay (HOSPITAL_COMMUNITY): Payer: Self-pay | Admitting: Certified Registered"

## 2023-04-20 ENCOUNTER — Other Ambulatory Visit: Payer: Self-pay

## 2023-04-20 ENCOUNTER — Encounter (HOSPITAL_COMMUNITY): Payer: Self-pay | Admitting: Family Medicine

## 2023-04-20 DIAGNOSIS — T8453XA Infection and inflammatory reaction due to internal right knee prosthesis, initial encounter: Secondary | ICD-10-CM

## 2023-04-20 DIAGNOSIS — I4891 Unspecified atrial fibrillation: Secondary | ICD-10-CM | POA: Diagnosis not present

## 2023-04-20 DIAGNOSIS — F17210 Nicotine dependence, cigarettes, uncomplicated: Secondary | ICD-10-CM | POA: Diagnosis not present

## 2023-04-20 DIAGNOSIS — M00061 Staphylococcal arthritis, right knee: Secondary | ICD-10-CM | POA: Diagnosis not present

## 2023-04-20 DIAGNOSIS — I10 Essential (primary) hypertension: Secondary | ICD-10-CM

## 2023-04-20 HISTORY — PX: HARDWARE REMOVAL: SHX979

## 2023-04-20 LAB — BASIC METABOLIC PANEL
Anion gap: 12 (ref 5–15)
BUN: 31 mg/dL — ABNORMAL HIGH (ref 6–20)
CO2: 24 mmol/L (ref 22–32)
Calcium: 9.4 mg/dL (ref 8.9–10.3)
Chloride: 99 mmol/L (ref 98–111)
Creatinine, Ser: 0.95 mg/dL (ref 0.61–1.24)
GFR, Estimated: >60 mL/min (ref >60)
Glucose, Bld: 101 mg/dL — ABNORMAL HIGH (ref 70–99)
Potassium: 3.6 mmol/L (ref 3.5–5.1)
Sodium: 135 mmol/L (ref 135–145)

## 2023-04-20 LAB — MAGNESIUM: Magnesium: 2.1 mg/dL (ref 1.7–2.4)

## 2023-04-20 LAB — CBC
HCT: 43 % (ref 39.0–52.0)
Hemoglobin: 14.2 g/dL (ref 13.0–17.0)
MCH: 29.5 pg (ref 26.0–34.0)
MCHC: 33 g/dL (ref 30.0–36.0)
MCV: 89.4 fL (ref 80.0–100.0)
Platelets: 184 10*3/uL (ref 150–400)
RBC: 4.81 MIL/uL (ref 4.22–5.81)
RDW: 15 % (ref 11.5–15.5)
WBC: 6.8 10*3/uL (ref 4.0–10.5)
nRBC: 0 % (ref 0.0–0.2)

## 2023-04-20 LAB — SURGICAL PCR SCREEN
MRSA, PCR: NEGATIVE
Staphylococcus aureus: NEGATIVE

## 2023-04-20 LAB — PROTIME-INR
INR: 1.4 — ABNORMAL HIGH (ref 0.8–1.2)
Prothrombin Time: 17.7 s — ABNORMAL HIGH (ref 11.4–15.2)

## 2023-04-20 LAB — PHOSPHORUS: Phosphorus: 3.1 mg/dL (ref 2.5–4.6)

## 2023-04-20 SURGERY — REMOVAL, HARDWARE
Anesthesia: General | Site: Knee | Laterality: Right

## 2023-04-20 MED ORDER — DIPHENHYDRAMINE HCL 12.5 MG/5ML PO ELIX: 12.5000 mg | ORAL_SOLUTION | ORAL | Status: DC | PRN

## 2023-04-20 MED ORDER — POLYETHYLENE GLYCOL 3350 17 G PO PACK: 17.0000 g | PACK | Freq: Every day | ORAL | Status: DC | PRN

## 2023-04-20 MED ORDER — ROCURONIUM BROMIDE 10 MG/ML (PF) SYRINGE
PREFILLED_SYRINGE | INTRAVENOUS | Status: AC
  Filled 2023-04-20: qty 10

## 2023-04-20 MED ORDER — ORAL CARE MOUTH RINSE
15.0000 mL | Freq: Once | OROMUCOSAL | Status: AC
  Administered 2023-04-20: 15 mL via OROMUCOSAL

## 2023-04-20 MED ORDER — METOCLOPRAMIDE HCL 5 MG PO TABS: 5.0000 mg | ORAL_TABLET | Freq: Three times a day (TID) | ORAL | Status: DC | PRN

## 2023-04-20 MED ORDER — ONDANSETRON HCL 4 MG/2ML IJ SOLN
INTRAMUSCULAR | Status: AC
  Filled 2023-04-20: qty 2

## 2023-04-20 MED ORDER — SUGAMMADEX SODIUM 200 MG/2ML IV SOLN
INTRAVENOUS | Status: AC
  Filled 2023-04-20: qty 2

## 2023-04-20 MED ORDER — FENTANYL CITRATE (PF) 100 MCG/2ML IJ SOLN: 25.0000 ug | INTRAMUSCULAR | Status: DC | PRN

## 2023-04-20 MED ORDER — OXYCODONE HCL 5 MG PO TABS
10.0000 mg | ORAL_TABLET | ORAL | Status: DC | PRN
  Administered 2023-04-20: 10 mg via ORAL
  Administered 2023-04-21 – 2023-05-03 (×41): 15 mg via ORAL
  Filled 2023-04-20 (×34): qty 3
  Filled 2023-04-20: qty 2
  Filled 2023-04-20 (×8): qty 3

## 2023-04-20 MED ORDER — FENTANYL CITRATE (PF) 250 MCG/5ML IJ SOLN
INTRAMUSCULAR | Status: AC
  Filled 2023-04-20: qty 5

## 2023-04-20 MED ORDER — PHENYLEPHRINE HCL (PRESSORS) 10 MG/ML IV SOLN
INTRAVENOUS | Status: DC | PRN
  Administered 2023-04-20: 80 ug via INTRAVENOUS

## 2023-04-20 MED ORDER — LIDOCAINE 2% (20 MG/ML) 5 ML SYRINGE
INTRAMUSCULAR | Status: DC | PRN
  Administered 2023-04-20: 60 mg via INTRAVENOUS

## 2023-04-20 MED ORDER — LACTATED RINGERS IV SOLN: INTRAVENOUS | Status: DC

## 2023-04-20 MED ORDER — 0.9 % SODIUM CHLORIDE (POUR BTL) OPTIME
TOPICAL | Status: DC | PRN
  Administered 2023-04-20: 3000 mL

## 2023-04-20 MED ORDER — SODIUM CHLORIDE 0.9% FLUSH
3.0000 mL | INTRAVENOUS | Status: DC | PRN
  Administered 2023-05-02: 10 mL via INTRAVENOUS

## 2023-04-20 MED ORDER — FENTANYL CITRATE (PF) 100 MCG/2ML IJ SOLN
INTRAMUSCULAR | Status: AC
  Administered 2023-04-20: 25 ug via INTRAVENOUS
  Filled 2023-04-20: qty 2

## 2023-04-20 MED ORDER — PROPOFOL 10 MG/ML IV BOLUS
INTRAVENOUS | Status: DC | PRN
  Administered 2023-04-20: 200 mg via INTRAVENOUS

## 2023-04-20 MED ORDER — ONDANSETRON HCL 4 MG/2ML IJ SOLN
INTRAMUSCULAR | Status: DC | PRN
  Administered 2023-04-20: 4 mg via INTRAVENOUS

## 2023-04-20 MED ORDER — DOCUSATE SODIUM 100 MG PO CAPS
100.0000 mg | ORAL_CAPSULE | Freq: Two times a day (BID) | ORAL | Status: DC
  Administered 2023-04-20 – 2023-05-03 (×20): 100 mg via ORAL
  Filled 2023-04-20 (×24): qty 1

## 2023-04-20 MED ORDER — KETOROLAC TROMETHAMINE 15 MG/ML IJ SOLN
15.0000 mg | Freq: Four times a day (QID) | INTRAMUSCULAR | Status: AC
  Administered 2023-04-20 – 2023-04-24 (×15): 15 mg via INTRAVENOUS
  Filled 2023-04-20 (×15): qty 1

## 2023-04-20 MED ORDER — AMISULPRIDE (ANTIEMETIC) 5 MG/2ML IV SOLN: 10.0000 mg | Freq: Once | INTRAVENOUS | Status: DC | PRN

## 2023-04-20 MED ORDER — SUGAMMADEX SODIUM 200 MG/2ML IV SOLN
INTRAVENOUS | Status: DC | PRN
  Administered 2023-04-20: 200 mg via INTRAVENOUS
  Administered 2023-04-20: 100 mg via INTRAVENOUS

## 2023-04-20 MED ORDER — LIDOCAINE 2% (20 MG/ML) 5 ML SYRINGE: INTRAMUSCULAR | Status: DC | PRN

## 2023-04-20 MED ORDER — HYDROMORPHONE HCL 1 MG/ML IJ SOLN
0.5000 mg | INTRAMUSCULAR | Status: DC | PRN
  Administered 2023-04-21 – 2023-04-30 (×16): 1 mg via INTRAVENOUS
  Filled 2023-04-20 (×18): qty 1

## 2023-04-20 MED ORDER — VANCOMYCIN HCL 1000 MG IV SOLR
INTRAVENOUS | Status: DC | PRN
  Administered 2023-04-20: 1000 mg via TOPICAL

## 2023-04-20 MED ORDER — DEXAMETHASONE SODIUM PHOSPHATE 10 MG/ML IJ SOLN
INTRAMUSCULAR | Status: AC
  Filled 2023-04-20: qty 1

## 2023-04-20 MED ORDER — CHLORHEXIDINE GLUCONATE 0.12 % MT SOLN: 15.0000 mL | Freq: Once | OROMUCOSAL | Status: AC

## 2023-04-20 MED ORDER — METOCLOPRAMIDE HCL 5 MG/ML IJ SOLN: 5.0000 mg | Freq: Three times a day (TID) | INTRAMUSCULAR | Status: DC | PRN

## 2023-04-20 MED ORDER — LIDOCAINE 2% (20 MG/ML) 5 ML SYRINGE
INTRAMUSCULAR | Status: AC
  Filled 2023-04-20: qty 5

## 2023-04-20 MED ORDER — PHENYLEPHRINE 80 MCG/ML (10ML) SYRINGE FOR IV PUSH (FOR BLOOD PRESSURE SUPPORT)
PREFILLED_SYRINGE | INTRAVENOUS | Status: AC
  Filled 2023-04-20: qty 10

## 2023-04-20 MED ORDER — SODIUM CHLORIDE 0.9% FLUSH
3.0000 mL | Freq: Two times a day (BID) | INTRAVENOUS | Status: DC
  Administered 2023-04-21 – 2023-04-22 (×2): 3 mL via INTRAVENOUS
  Administered 2023-04-22 – 2023-04-23 (×3): 10 mL via INTRAVENOUS
  Administered 2023-04-24 – 2023-04-25 (×2): 3 mL via INTRAVENOUS
  Administered 2023-04-26: 10 mL via INTRAVENOUS
  Administered 2023-04-26: 3 mL via INTRAVENOUS
  Administered 2023-04-27 – 2023-04-29 (×6): 10 mL via INTRAVENOUS
  Administered 2023-04-30: 3 mL via INTRAVENOUS
  Administered 2023-04-30 – 2023-05-01 (×2): 10 mL via INTRAVENOUS
  Administered 2023-05-01 – 2023-05-02 (×2): 3 mL via INTRAVENOUS
  Administered 2023-05-02: 10 mL via INTRAVENOUS
  Administered 2023-05-03: 3 mL via INTRAVENOUS

## 2023-04-20 MED ORDER — EPHEDRINE 5 MG/ML INJ
INTRAVENOUS | Status: AC
  Filled 2023-04-20: qty 5

## 2023-04-20 MED ORDER — BISACODYL 10 MG RE SUPP: 10.0000 mg | Freq: Every day | RECTAL | Status: DC | PRN

## 2023-04-20 MED ORDER — PROPOFOL 10 MG/ML IV BOLUS
INTRAVENOUS | Status: AC
  Filled 2023-04-20: qty 20

## 2023-04-20 MED ORDER — MIDAZOLAM HCL 2 MG/2ML IJ SOLN
INTRAMUSCULAR | Status: AC
  Filled 2023-04-20: qty 2

## 2023-04-20 MED ORDER — CHLORHEXIDINE GLUCONATE 0.12 % MT SOLN
OROMUCOSAL | Status: AC
  Filled 2023-04-20: qty 15

## 2023-04-20 MED ORDER — ROCURONIUM BROMIDE 10 MG/ML (PF) SYRINGE
PREFILLED_SYRINGE | INTRAVENOUS | Status: DC | PRN
  Administered 2023-04-20: 70 mg via INTRAVENOUS

## 2023-04-20 MED ORDER — DEXAMETHASONE SODIUM PHOSPHATE 10 MG/ML IJ SOLN
INTRAMUSCULAR | Status: DC | PRN
  Administered 2023-04-20: 5 mg via INTRAVENOUS

## 2023-04-20 MED ORDER — TOBRAMYCIN SULFATE 1.2 G IJ SOLR
INTRAMUSCULAR | Status: DC | PRN
  Administered 2023-04-20: 1.2 g via TOPICAL

## 2023-04-20 MED ORDER — FENTANYL CITRATE (PF) 250 MCG/5ML IJ SOLN
INTRAMUSCULAR | Status: DC | PRN
  Administered 2023-04-20 (×3): 50 ug via INTRAVENOUS

## 2023-04-20 SURGICAL SUPPLY — 54 items
BAG COUNTER SPONGE SURGICOUNT (BAG) ×1 IMPLANT
BANDAGE ESMARK 6X9 LF (GAUZE/BANDAGES/DRESSINGS) ×1 IMPLANT
BNDG COHESIVE 6X5 TAN ST LF (GAUZE/BANDAGES/DRESSINGS) ×1 IMPLANT
BNDG ELASTIC 4X5.8 VLCR STR LF (GAUZE/BANDAGES/DRESSINGS) ×1 IMPLANT
BNDG ELASTIC 6X5.8 VLCR STR LF (GAUZE/BANDAGES/DRESSINGS) ×1 IMPLANT
BNDG ESMARK 6X9 LF (GAUZE/BANDAGES/DRESSINGS) ×1 IMPLANT
BNDG GAUZE DERMACEA FLUFF 4 (GAUZE/BANDAGES/DRESSINGS) ×2 IMPLANT
BRUSH SCRUB EZ PLAIN DRY (MISCELLANEOUS) ×2 IMPLANT
CHLORAPREP W/TINT 26 (MISCELLANEOUS) ×1 IMPLANT
COVER SURGICAL LIGHT HANDLE (MISCELLANEOUS) ×2 IMPLANT
CUFF TOURN SGL QUICK 18X4 (TOURNIQUET CUFF) IMPLANT
CUFF TRNQT CYL 24X4X16.5-23 (TOURNIQUET CUFF) IMPLANT
CUFF TRNQT CYL 34X4.125X (TOURNIQUET CUFF) IMPLANT
DRAPE C-ARM 42X72 X-RAY (DRAPES) IMPLANT
DRAPE C-ARMOR (DRAPES) ×1 IMPLANT
DRAPE U-SHAPE 47X51 STRL (DRAPES) ×1 IMPLANT
DRESSING PEEL AND PLAC PRVNA20 (GAUZE/BANDAGES/DRESSINGS) IMPLANT
DRSG ADAPTIC 3X8 NADH LF (GAUZE/BANDAGES/DRESSINGS) ×1 IMPLANT
DRSG PEEL AND PLACE PREVENA 20 (GAUZE/BANDAGES/DRESSINGS) ×1 IMPLANT
ELECT REM PT RETURN 9FT ADLT (ELECTROSURGICAL) ×1 IMPLANT
ELECTRODE REM PT RTRN 9FT ADLT (ELECTROSURGICAL) ×1 IMPLANT
GAUZE SPONGE 4X4 12PLY STRL (GAUZE/BANDAGES/DRESSINGS) ×1 IMPLANT
GLOVE BIO SURGEON STRL SZ 6.5 (GLOVE) ×3 IMPLANT
GLOVE BIO SURGEON STRL SZ7.5 (GLOVE) ×4 IMPLANT
GLOVE BIOGEL PI IND STRL 6.5 (GLOVE) ×1 IMPLANT
GLOVE BIOGEL PI IND STRL 7.5 (GLOVE) ×1 IMPLANT
GOWN STRL REUS W/ TWL LRG LVL3 (GOWN DISPOSABLE) ×2 IMPLANT
KIT BASIN OR (CUSTOM PROCEDURE TRAY) ×1 IMPLANT
KIT TURNOVER KIT B (KITS) ×1 IMPLANT
MANIFOLD NEPTUNE II (INSTRUMENTS) ×1 IMPLANT
NDL 22X1.5 STRL (OR ONLY) (MISCELLANEOUS) IMPLANT
NEEDLE 22X1.5 STRL (OR ONLY) (MISCELLANEOUS) IMPLANT
NS IRRIG 1000ML POUR BTL (IV SOLUTION) ×1 IMPLANT
PACK ORTHO EXTREMITY (CUSTOM PROCEDURE TRAY) ×1 IMPLANT
PAD ARMBOARD 7.5X6 YLW CONV (MISCELLANEOUS) ×2 IMPLANT
PADDING CAST COTTON 6X4 STRL (CAST SUPPLIES) ×3 IMPLANT
SPONGE T-LAP 18X18 ~~LOC~~+RFID (SPONGE) ×1 IMPLANT
STAPLER VISISTAT 35W (STAPLE) IMPLANT
STOCKINETTE IMPERVIOUS LG (DRAPES) ×1 IMPLANT
STRIP CLOSURE SKIN 1/2X4 (GAUZE/BANDAGES/DRESSINGS) IMPLANT
SUCTION TUBE FRAZIER 10FR DISP (SUCTIONS) IMPLANT
SUT ETHILON 3 0 PS 1 (SUTURE) IMPLANT
SUT MNCRL AB 3-0 PS2 18 (SUTURE) ×1 IMPLANT
SUT MON AB 2-0 CT1 36 (SUTURE) ×1 IMPLANT
SUT PDS AB 2-0 CT1 27 (SUTURE) IMPLANT
SUT VIC AB 0 CT1 27XBRD ANBCTR (SUTURE) IMPLANT
SUT VIC AB 2-0 CT1 TAPERPNT 27 (SUTURE) IMPLANT
SYR CONTROL 10ML LL (SYRINGE) IMPLANT
TOWEL GREEN STERILE (TOWEL DISPOSABLE) ×2 IMPLANT
TOWEL GREEN STERILE FF (TOWEL DISPOSABLE) ×2 IMPLANT
TUBE CONNECTING 12X1/4 (SUCTIONS) ×1 IMPLANT
UNDERPAD 30X36 HEAVY ABSORB (UNDERPADS AND DIAPERS) ×1 IMPLANT
WATER STERILE IRR 1000ML POUR (IV SOLUTION) ×2 IMPLANT
YANKAUER SUCT BULB TIP NO VENT (SUCTIONS) ×1 IMPLANT

## 2023-04-20 NOTE — Anesthesia Preprocedure Evaluation (Signed)
Anesthesia Evaluation  Patient identified by MRN, date of birth, ID band Patient awake    Reviewed: Allergy & Precautions, NPO status , Patient's Chart, lab work & pertinent test results  History of Anesthesia Complications Negative for: history of anesthetic complications  Airway Mallampati: III  TM Distance: >3 FB Neck ROM: Full   Comment: Previous grade I view with MAC 4, easy mask with OPA Dental no notable dental hx. (+) Poor Dentition, Missing, Dental Advisory Given Broken:   Pulmonary neg shortness of breath, neg sleep apnea, neg COPD, neg recent URI, Current Smoker and Patient abstained from smoking.   Pulmonary exam normal breath sounds clear to auscultation       Cardiovascular hypertension, Pt. on home beta blockers (-) angina (-) Past MI, (-) Cardiac Stents and (-) CABG Normal cardiovascular exam+ dysrhythmias Atrial Fibrillation and Supra Ventricular Tachycardia + Valvular Problems/Murmurs (mild MR, mild-to-moderate TR)  Rhythm:Regular Rate:Normal  TTE 04/18/2023: IMPRESSIONS    1. Left ventricular ejection fraction, by estimation, is 50 to 55%. The  left ventricle has low normal function. The left ventricle demonstrates  global hypokinesis. There is mild left ventricular hypertrophy of the  basal-septal segment. Left ventricular  diastolic function could not be evaluated.   2. Right ventricular systolic function is normal. The right ventricular  size is severely enlarged. There is normal pulmonary artery systolic  pressure. The estimated right ventricular systolic pressure is 34.7 mmHg.   3. The mitral valve is abnormal. Mild mitral valve regurgitation. No  evidence of mitral stenosis.   4. Tricuspid valve regurgitation is mild to moderate.   5. The aortic valve is tricuspid. There is moderate calcification of the  aortic valve. There is moderate thickening of the aortic valve. Aortic  valve regurgitation is trivial.  Aortic valve sclerosis/calcification is  present, without any evidence of  aortic stenosis.   6. The inferior vena cava is dilated in size with <50% respiratory  variability, suggesting right atrial pressure of 15 mmHg.     Neuro/Psych negative neurological ROS  negative psych ROS   GI/Hepatic negative GI ROS,,,(+) Cirrhosis  (s/p TIPS)    substance abuse  alcohol use, cocaine use and marijuana use  Endo/Other  negative endocrine ROS    Renal/GU negative Renal ROS     Musculoskeletal  (+) Arthritis ,    Abdominal  (+) + obese  Peds  Hematology negative hematology ROS (+) Lab Results      Component                Value               Date                      WBC                      8.0                 04/18/2023                HGB                      12.9 (L)            04/18/2023                HCT  39.6                04/18/2023                MCV                      92.5                04/18/2023                PLT                      133 (L)             04/18/2023              Anesthesia Other Findings All: Tylenol  Reproductive/Obstetrics                             Anesthesia Physical Anesthesia Plan  ASA: 4  Anesthesia Plan: MAC   Post-op Pain Management: Minimal or no pain anticipated   Induction:   PONV Risk Score and Plan: 1 and Ondansetron, Treatment may vary due to age or medical condition and Propofol infusion  Airway Management Planned: Natural Airway and Nasal Cannula  Additional Equipment: None  Intra-op Plan:   Post-operative Plan:   Informed Consent: I have reviewed the patients History and Physical, chart, labs and discussed the procedure including the risks, benefits and alternatives for the proposed anesthesia with the patient or authorized representative who has indicated his/her understanding and acceptance.     Dental advisory given  Plan Discussed with: CRNA and  Anesthesiologist  Anesthesia Plan Comments: (R)       Anesthesia Quick Evaluation

## 2023-04-20 NOTE — Progress Notes (Signed)
   [facility] has been requested to perform a transesophageal echocardiogram on Jonathan Ibarra for bacteremia.    Patient has known hx of liver cirrhosis, s/p TIPS in 2017, last EGD on 10/16/14 at [facility] for melena, report not available. No recent GI bleed per patient.   The patient has: No other conditions that may impact this procedure.    After careful review of history and examination, the risks and benefits of transesophageal echocardiogram have been explained including risks of esophageal damage, perforation (1:10,000 risk), bleeding, pharyngeal hematoma as well as other potential complications associated with conscious sedation including aspiration, arrhythmia, respiratory failure and death. Alternatives to treatment were discussed, questions were answered. Patient is willing to proceed.   Will check INR. PLT 184k today. LFT WNL from 04/17/23. He is compensated from cirrhosis. Review with Dr Cristal Deer, will proceed with TEE.     Signed, Cyndi Bender, NP  04/20/2023 1:56 PM

## 2023-04-20 NOTE — Plan of Care (Signed)

## 2023-04-20 NOTE — Progress Notes (Signed)
Orthopedic Tech Progress Note Patient Details:  Jonathan Ibarra Jul 27, 1965 [id number]  OVER HEAD FRAMES with TRAPEZE weight limit is 113.5KG and patient is 118.2KG.  [name], male   DOB: 10-14-1965, 58 y.o.   MRN: [id number]  Jonathan Ibarra 04/20/2023, 1:48 PM

## 2023-04-20 NOTE — Anesthesia Procedure Notes (Signed)
Procedure Name: Intubation Date/Time: 04/20/2023 10:30 AM  Performed by: Alwyn Ren, CRNAPre-anesthesia Checklist: Patient identified, Emergency Drugs available, Suction available and Patient being monitored Patient Re-evaluated:Patient Re-evaluated prior to induction Oxygen Delivery Method: Circle system utilized Preoxygenation: Pre-oxygenation with 100% oxygen Induction Type: IV induction Ventilation: Mask ventilation without difficulty Laryngoscope Size: Mac, 4, Miller and 2 Grade View: Grade II Tube type: Oral Tube size: 7.5 mm Number of attempts: 2 Airway Equipment and Method: Stylet and Oral airway Placement Confirmation: ETT inserted through vocal cords under direct vision, positive ETCO2 and breath sounds checked- equal and bilateral Secured at: 24 cm Tube secured with: Tape Dental Injury: Teeth and Oropharynx as per pre-operative assessment  Comments: Intubated second attempt by Dr. Freida Busman with MAC 4

## 2023-04-20 NOTE — Interval H&P Note (Signed)
History and Physical Interval Note:  04/20/2023 9:41 AM  Jonathan Ibarra  has presented today for surgery, with the diagnosis of Right knee infection.  The various methods of treatment have been discussed with the patient and family. After consideration of risks, benefits and other options for treatment, the patient has consented to  Procedure(s): HARDWARE REMOVAL Knee (Right) as a surgical intervention.  The patient's history has been reviewed, patient examined, no change in status, stable for surgery.  I have reviewed the patient's chart and labs.  Questions were answered to the patient's satisfaction.     Caryn Bee P Deloise Marchant

## 2023-04-20 NOTE — Transfer of Care (Signed)
Immediate Anesthesia Transfer of Care Note  Patient: Jonathan Ibarra  Procedure(s) Performed: HARDWARE REMOVAL Knee (Right: Knee)  Patient Location: PACU  Anesthesia Type:General  Level of Consciousness: awake  Airway & Oxygen Therapy: Patient Spontanous Breathing  Post-op Assessment: Report given to RN and Post -op Vital signs reviewed and stable  Post vital signs: Reviewed and stable  Last Vitals:  Vitals Value Taken Time  BP 139/91 04/20/23 1145  Temp    Pulse 95 04/20/23 1151  Resp 31 04/20/23 1151  SpO2 89 % 04/20/23 1151  Vitals shown include unfiled device data.  Last Pain:  Vitals:   04/20/23 0909  TempSrc: Axillary  PainSc:       Patients Stated Pain Goal: 0 (04/19/23 1014)  Complications: No notable events documented.

## 2023-04-20 NOTE — Progress Notes (Signed)
PROGRESS NOTE    Jonathan Ibarra  UEA:[id number] DOB: 29-Jul-1965 DOA: 04/16/2023 PCP: Candi Leash, MD     Brief Narrative:  Jonathan Ibarra is a 58 yr old male with PMH significant for Liver cirrhosis, s/p TIPS procedure, Essential hypertension, polysubstance abuse, Atrial Fibrillation , recent right plateau fracture requiring ORIF on 12/13/22.  Patient was hospitalized from 12/27/22 -02/04/2023 with purulent discharge from the right knee, thought to have post operative septic arthritis. Patient was managed with IV ertapenem. Patient was evaluated by infectious disease and patient was discharged on omadacycline. Patient was advised to follow-up with orthopedics and infectious diseases. Patient had undergone irrigation and debridement of right knee three times as an outpatient. Recently has not followed up with orthopedics, now presented with worsening pain and swelling of right knee associated with purulent drainage. Right knee x-ray showed  large joint effusion.  Soft tissue edema.  Orthopedics and infectious disease consulted.  Blood cultures positive for MRSA.  New events last 24 hours / Subjective: Patient complaining of right knee pain and swelling.  Pending OR this morning for I&D and hardware removal.  Assessment & Plan:   Principal Problem:   Septic joint of right knee joint (HCC) Active Problems:   Thrombocytopenia (HCC)   Essential hypertension   Hepatic cirrhosis (HCC)   Polysubstance abuse (HCC)   S/P TIPS (transjugular intrahepatic portosystemic shunt)   Atrial fibrillation, chronic (HCC)   MRSA bacteremia   Effusion of right knee joint   Septic arthritis of right knee, MRSA bacteremia -History of previous I&D as outpatient, completed treatment with IV or ertapenem and subsequently omadacycline -Orthopedic surgery consulted, planned for hardware removal and I&D 2/9 -Blood cultures positive for MRSA -Infectious disease following -Continue daptomycin -TEE  2/20  Chronic A-fib -CHA2DS2-VASc 1  -Not on anticoagulation -Metoprolol, Cardizem -Cardiology following  Liver cirrhosis status post TIPS, thrombocytopenia -Lactulose  Polysubstance abuse -Counseling  Obesity -Estimated body mass index is 35.33 kg/m as calculated from the following:   Height as of this encounter: 6' 0.01" (1.829 m).   Weight as of this encounter: 118.2 kg.    DVT prophylaxis:  Place and maintain sequential compression device Start: 04/16/23 1702  Code Status: Full code Family Communication: None at bedside Disposition Plan: Home Status is: Inpatient Remains inpatient appropriate because: OR today    Antimicrobials:  Anti-infectives (From admission, onward)    Start     Dose/Rate Route Frequency Ordered Stop   04/20/23 1109  vancomycin (VANCOCIN) powder  Status:  Discontinued          As needed 04/20/23 1109 04/20/23 1137   04/20/23 1109  tobramycin (NEBCIN) powder  Status:  Discontinued          As needed 04/20/23 1109 04/20/23 1137   04/18/23 1400  DAPTOmycin (CUBICIN) 800 mg in sodium chloride 0.9 % IVPB       Placed in "And" Linked Group   8 mg/kg  93.8 kg (Adjusted) 132 mL/hr over 30 Minutes Intravenous Daily 04/18/23 0754     04/18/23 0500  piperacillin-tazobactam (ZOSYN) IVPB 3.375 g  Status:  Discontinued        3.375 g 12.5 mL/hr over 240 Minutes Intravenous Every 8 hours 04/17/23 1002 04/17/23 1026   04/17/23 2300  piperacillin-tazobactam (ZOSYN) IVPB 3.375 g  Status:  Discontinued       Placed in "Followed by" Linked Group   3.375 g 12.5 mL/hr over 240 Minutes Intravenous Every 8 hours 04/16/23 1609 04/17/23 0007   04/17/23  2100  DAPTOmycin (CUBICIN) 800 mg in sodium chloride 0.9 % IVPB  Status:  Discontinued        8 mg/kg  93.8 kg (Adjusted) 132 mL/hr over 30 Minutes Intravenous Daily 04/17/23 1249 04/18/23 0754   04/17/23 1930  vancomycin (VANCOCIN) IVPB 1000 mg/200 mL premix  Status:  Discontinued        1,000 mg 200 mL/hr  over 60 Minutes Intravenous Every 12 hours 04/17/23 1016 04/18/23 0748   04/17/23 0730  vancomycin (VANCOREADY) IVPB 1250 mg/250 mL  Status:  Discontinued        1,250 mg 166.7 mL/hr over 90 Minutes Intravenous Every 12 hours 04/16/23 1846 04/17/23 1016   04/17/23 0500  vancomycin (VANCOREADY) IVPB 1250 mg/250 mL  Status:  Discontinued        1,250 mg 166.7 mL/hr over 90 Minutes Intravenous Every 12 hours 04/16/23 1628 04/16/23 1846   04/17/23 0100  ertapenem (INVANZ) 1 g in sodium chloride 0.9 % 100 mL IVPB        1 g 200 mL/hr over 30 Minutes Intravenous  Once 04/17/23 0008 04/17/23 0516   04/16/23 1615  vancomycin (VANCOREADY) IVPB 2000 mg/400 mL        2,000 mg 200 mL/hr over 120 Minutes Intravenous  Once 04/16/23 1609 04/16/23 2319   04/16/23 1615  piperacillin-tazobactam (ZOSYN) IVPB 3.375 g       Placed in "Followed by" Linked Group   3.375 g 100 mL/hr over 30 Minutes Intravenous  Once 04/16/23 1609 04/16/23 1717   04/16/23 1600  ceFAZolin (ANCEF) IVPB 1 g/50 mL premix        1 g 100 mL/hr over 30 Minutes Intravenous  Once 04/16/23 1554 04/16/23 1807        Objective: Vitals:   04/20/23 1145 04/20/23 1200 04/20/23 1215 04/20/23 1220  BP: (!) 139/91 (!) 143/102 (!) 157/93 (!) 157/93  Pulse: 90 88 97 88  Resp: 18 20 15 10   Temp: 98.1 F (36.7 C)  97.8 F (36.6 C)   TempSrc:      SpO2: 95% 98% 92% 94%  Weight:      Height:        Intake/Output Summary (Last 24 hours) at 04/20/2023 1250 Last data filed at 04/20/2023 1200 Gross per 24 hour  Intake 966.37 ml  Output 1840 ml  Net -873.63 ml   Filed Weights   04/16/23 1819  Weight: 118.2 kg    Examination:  General exam: Appears calm  Respiratory system: Clear to auscultation. Respiratory effort normal. No respiratory distress. No conversational dyspnea.  Cardiovascular system: S1 & S2 heard, irregular rhythm, rate 100. No murmurs. No pedal edema. Gastrointestinal system: Abdomen is nondistended, soft  Central  nervous system: Alert and oriented.  Extremities: Right knee effusion and tenderness to palpation Psychiatry: Judgement and insight appear normal.   Data Reviewed: I have personally reviewed following labs and imaging studies  CBC: Recent Labs  Lab 04/16/23 1439 04/17/23 0252 04/18/23 0706 04/20/23 0320  WBC 10.9* 13.7* 8.0 6.8  NEUTROABS 8.8*  --  5.8  --   HGB 14.0 14.0 12.9* 14.2  HCT 42.6 44.2 39.6 43.0  MCV 89.9 93.4 92.5 89.4  PLT 124* 164 133* 184   Basic Metabolic Panel: Recent Labs  Lab 04/16/23 1439 04/17/23 0252 04/18/23 0706 04/20/23 0320  NA 135 134* 130* 135  K 3.6 4.2 4.2 3.6  CL 101 99 101 99  CO2 23 26 22 24   GLUCOSE 95 102* 116* 101*  BUN 11 14 26* 31*  CREATININE 0.90 1.13 0.97 0.95  CALCIUM 9.1 9.0 8.7* 9.4  MG  --  1.6* 2.1 2.1  PHOS  --  3.2 2.6 3.1   GFR: Estimated Creatinine Clearance: 115.2 mL/min (by C-G formula based on SCr of 0.95 mg/dL). Liver Function Tests: Recent Labs  Lab 04/17/23 0252  AST 19  ALT 18  ALKPHOS 91  BILITOT 4.1*  PROT 6.6  ALBUMIN 2.9*   No results for input(s): "LIPASE", "AMYLASE" in the last 168 hours. Recent Labs  Lab 04/16/23 1453  AMMONIA 49*   Coagulation Profile: No results for input(s): "INR", "PROTIME" in the last 168 hours. Cardiac Enzymes: Recent Labs  Lab 04/19/23 0721  CKTOTAL 14*   BNP (last 3 results) No results for input(s): "PROBNP" in the last 8760 hours. HbA1C: No results for input(s): "HGBA1C" in the last 72 hours. CBG: Recent Labs  Lab 04/16/23 2331  GLUCAP 101*   Lipid Profile: No results for input(s): "CHOL", "HDL", "LDLCALC", "TRIG", "CHOLHDL", "LDLDIRECT" in the last 72 hours. Thyroid Function Tests: No results for input(s): "TSH", "T4TOTAL", "FREET4", "T3FREE", "THYROIDAB" in the last 72 hours. Anemia Panel: No results for input(s): "VITAMINB12", "FOLATE", "FERRITIN", "TIBC", "IRON", "RETICCTPCT" in the last 72 hours. Sepsis Labs: Recent Labs  Lab 04/16/23 1459  04/17/23 0252 04/17/23 1037  LATICACIDVEN 2.1* 2.1* 2.9*    Recent Results (from the past 240 hours)  Blood culture (routine x 2)     Status: Abnormal (Preliminary result)   Collection Time: 04/16/23  1:44 PM   Specimen: BLOOD  Result Value Ref Range Status   Specimen Description BLOOD SITE NOT SPECIFIED  Final   Special Requests   Final    BOTTLES DRAWN AEROBIC AND ANAEROBIC Blood Culture adequate volume   Culture  Setup Time   Final    GRAM POSITIVE COCCI IN CLUSTERS IN BOTH AEROBIC AND ANAEROBIC BOTTLES CRITICAL RESULT CALLED TO, READ BACK BY AND VERIFIED WITH: PHARMD JENNY ZHOU [id number] 0800 BY J RAZZAK, MT    Culture (A)  Final    METHICILLIN RESISTANT STAPHYLOCOCCUS AUREUS Sent to [facility] for further susceptibility testing. Performed at [facility], [address], [place], [place] [address]    Report Status PENDING  Incomplete   Organism ID, Bacteria METHICILLIN RESISTANT STAPHYLOCOCCUS AUREUS  Final      Susceptibility   Methicillin resistant staphylococcus aureus - MIC*    CIPROFLOXACIN >=8 RESISTANT Resistant     ERYTHROMYCIN >=8 RESISTANT Resistant     GENTAMICIN <=0.5 SENSITIVE Sensitive     OXACILLIN >=4 RESISTANT Resistant     TETRACYCLINE >=16 RESISTANT Resistant     VANCOMYCIN <=0.5 SENSITIVE Sensitive     TRIMETH/SULFA 160 RESISTANT Resistant     CLINDAMYCIN <=0.25 SENSITIVE Sensitive     RIFAMPIN <=0.5 SENSITIVE Sensitive     Inducible Clindamycin NEGATIVE Sensitive     LINEZOLID 2 SENSITIVE Sensitive     * METHICILLIN RESISTANT STAPHYLOCOCCUS AUREUS  Blood Culture ID Panel (Reflexed)     Status: Abnormal   Collection Time: 04/16/23  1:44 PM  Result Value Ref Range Status   Enterococcus faecalis NOT DETECTED NOT DETECTED Final   Enterococcus Faecium NOT DETECTED NOT DETECTED Final   Listeria monocytogenes NOT DETECTED NOT DETECTED Final   Staphylococcus species DETECTED (A) NOT DETECTED Final    Comment: CRITICAL RESULT CALLED TO, READ BACK BY  AND VERIFIED WITH: PHARMD JENNY ZHOU [phone] 0800 BY J RAZZAK, MT    Staphylococcus aureus (  BCID) DETECTED (A) NOT DETECTED Final    Comment: Methicillin (oxacillin)-resistant Staphylococcus aureus (MRSA). MRSA is predictably resistant to beta-lactam antibiotics (except ceftaroline). Preferred therapy is vancomycin unless clinically contraindicated. Patient requires contact precautions if  hospitalized. CRITICAL RESULT CALLED TO, READ BACK BY AND VERIFIED WITH: PHARMD JENNY ZHOU [id number] 0800 BY J RAZZAK, MT    Staphylococcus epidermidis NOT DETECTED NOT DETECTED Final   Staphylococcus lugdunensis NOT DETECTED NOT DETECTED Final   Streptococcus species NOT DETECTED NOT DETECTED Final   Streptococcus agalactiae NOT DETECTED NOT DETECTED Final   Streptococcus pneumoniae NOT DETECTED NOT DETECTED Final   Streptococcus pyogenes NOT DETECTED NOT DETECTED Final   A.calcoaceticus-baumannii NOT DETECTED NOT DETECTED Final   Bacteroides fragilis NOT DETECTED NOT DETECTED Final   Enterobacterales NOT DETECTED NOT DETECTED Final   Enterobacter cloacae complex NOT DETECTED NOT DETECTED Final   Escherichia coli NOT DETECTED NOT DETECTED Final   Klebsiella aerogenes NOT DETECTED NOT DETECTED Final   Klebsiella oxytoca NOT DETECTED NOT DETECTED Final   Klebsiella pneumoniae NOT DETECTED NOT DETECTED Final   Proteus species NOT DETECTED NOT DETECTED Final   Salmonella species NOT DETECTED NOT DETECTED Final   Serratia marcescens NOT DETECTED NOT DETECTED Final   Haemophilus influenzae NOT DETECTED NOT DETECTED Final   Neisseria meningitidis NOT DETECTED NOT DETECTED Final   Pseudomonas aeruginosa NOT DETECTED NOT DETECTED Final   Stenotrophomonas maltophilia NOT DETECTED NOT DETECTED Final   Candida albicans NOT DETECTED NOT DETECTED Final   Candida auris NOT DETECTED NOT DETECTED Final   Candida glabrata NOT DETECTED NOT DETECTED Final   Candida krusei NOT DETECTED NOT DETECTED Final   Candida  parapsilosis NOT DETECTED NOT DETECTED Final   Candida tropicalis NOT DETECTED NOT DETECTED Final   Cryptococcus neoformans/gattii NOT DETECTED NOT DETECTED Final   Meth resistant mecA/C and MREJ DETECTED (A) NOT DETECTED Final    Comment: CRITICAL RESULT CALLED TO, READ BACK BY AND VERIFIED WITH: PHARMD JENNY ZHOU [id number] BY Berline Chough, MT Performed at [facility], [address] [place], [place] [address]   Culture, blood (Routine X 2) w Reflex to ID Panel     Status: Abnormal   Collection Time: 04/16/23  3:08 PM   Specimen: BLOOD LEFT ARM  Result Value Ref Range Status   Specimen Description BLOOD LEFT ARM  Final   Special Requests   Final    BOTTLES DRAWN AEROBIC AND ANAEROBIC Blood Culture results may not be optimal due to an inadequate volume of blood received in culture bottles   Culture  Setup Time   Final    GRAM POSITIVE COCCI IN CLUSTERS IN BOTH AEROBIC AND ANAEROBIC BOTTLES CRITICAL VALUE NOTED.  VALUE IS CONSISTENT WITH PREVIOUSLY REPORTED AND CALLED VALUE.    Culture (A)  Final    STAPHYLOCOCCUS AUREUS SUSCEPTIBILITIES PERFORMED ON PREVIOUS CULTURE WITHIN THE LAST 5 DAYS. Performed at [facility], [address], [place], [place] [address]    Report Status 04/19/2023 FINAL  Final  Culture, blood (Routine X 2) w Reflex to ID Panel     Status: None (Preliminary result)   Collection Time: 04/18/23  7:06 AM   Specimen: BLOOD RIGHT HAND  Result Value Ref Range Status   Specimen Description BLOOD RIGHT HAND  Final   Special Requests   Final    BOTTLES DRAWN AEROBIC AND ANAEROBIC Blood Culture results may not be optimal due to an inadequate volume of blood received in culture bottles  Culture   Final    NO GROWTH 2 DAYS Performed at [facility], [address], [place], [place] [address]    Report Status PENDING  Incomplete  Culture, blood (Routine X 2) w Reflex to ID Panel     Status: None (Preliminary result)   Collection Time: 04/18/23  7:08 AM    Specimen: BLOOD  Result Value Ref Range Status   Specimen Description BLOOD RIGHT ANTECUBITAL  Final   Special Requests   Final    BOTTLES DRAWN AEROBIC AND ANAEROBIC Blood Culture results may not be optimal due to an inadequate volume of blood received in culture bottles   Culture   Final    NO GROWTH 2 DAYS Performed at [facility], [address], [place], [place] [address]    Report Status PENDING  Incomplete  Surgical pcr screen     Status: None   Collection Time: 04/20/23  7:36 AM   Specimen: Nasal Mucosa; Nasal Swab  Result Value Ref Range Status   MRSA, PCR NEGATIVE NEGATIVE Final   Staphylococcus aureus NEGATIVE NEGATIVE Final    Comment: (NOTE) The Xpert SA Assay (FDA approved for NASAL specimens in patients 38 years of age and older), is one component of a comprehensive surveillance program. It is not intended to diagnose infection nor to guide or monitor treatment. Performed at [facility], [address] [place], [place] [address]       Radiology Studies: DG Knee 1-2 Views Right Result Date: 04/20/2023 CLINICAL DATA:  Hardware removal right knee. Intraoperative fluoroscopy. EXAM: RIGHT KNEE - 1-2 VIEW COMPARISON:  Right knee radiographs 04/16/2023, CT right knee 04/18/2023 FINDINGS: Images were performed intraoperatively without the presence of a radiologist. The patient is undergoing removal of the prior proximal tibial lateral plate and screw hardware. Depressed lateral tibial plateau fracture again noted. Total fluoroscopy images: 2 Total fluoroscopy time: 3 seconds Total dose: Radiation Exposure Index (as provided by the fluoroscopic device): 0.26 mGy air Kerma Please see intraoperative findings for further detail. IMPRESSION: Intraoperative fluoroscopy for hardware removal. Electronically Signed   By: Neita Garnet M.D.   On: 04/20/2023 12:20   DG C-Arm 1-60 Min-No Report Result Date: 04/20/2023 Fluoroscopy was utilized by the requesting physician.   No radiographic interpretation.   ECHOCARDIOGRAM COMPLETE Result Date: 04/18/2023    ECHOCARDIOGRAM REPORT   Patient Name:   ZYMIER RODGERS Date of Exam: 04/18/2023 Medical Rec #:  [id number]          Height:       72.0 in Accession #:    [id number]         Weight:       260.6 lb Date of Birth:  1965-04-21         BSA:          2.384 m Patient Age:    57 years           BP:           117/71 mmHg Patient Gender: M                  HR:           56 bpm. Exam Location:  Inpatient Procedure: 2D Echo, Cardiac Doppler and Color Doppler (Both Spectral and Color            Flow Doppler were utilized during procedure). Indications:    Bacteremia  History:  Patient has prior history of Echocardiogram examinations, most                 recent 10/01/2022. Abnormal ECG, Arrythmias:Atrial Fibrillation,                 Signs/Symptoms:Bacteremia; Risk Factors:Hypertension.                 Polysbstance abuse.  Sonographer:    Sheralyn Boatman RDCS Referring Phys: [id number] CORNELIUS N VAN DAM  Sonographer Comments: Technically difficult study due to poor echo windows. Patient moving throughout exam. Patient nearly rolled off the bed. IMPRESSIONS  1. Left ventricular ejection fraction, by estimation, is 50 to 55%. The left ventricle has low normal function. The left ventricle demonstrates global hypokinesis. There is mild left ventricular hypertrophy of the basal-septal segment. Left ventricular diastolic function could not be evaluated.  2. Right ventricular systolic function is normal. The right ventricular size is severely enlarged. There is normal pulmonary artery systolic pressure. The estimated right ventricular systolic pressure is 34.7 mmHg.  3. The mitral valve is abnormal. Mild mitral valve regurgitation. No evidence of mitral stenosis.  4. Tricuspid valve regurgitation is mild to moderate.  5. The aortic valve is tricuspid. There is moderate calcification of the aortic valve. There is moderate thickening of the aortic valve.  Aortic valve regurgitation is trivial. Aortic valve sclerosis/calcification is present, without any evidence of aortic stenosis.  6. The inferior vena cava is dilated in size with <50% respiratory variability, suggesting right atrial pressure of 15 mmHg. Conclusion(s)/Recommendation(s): No evidence of valvular vegetations on this transthoracic echocardiogram but the mitral valve leaflets are thickened with some calcification. Consider a transesophageal echocardiogram to exclude infective endocarditis if clinically indicated. FINDINGS  Left Ventricle: Left ventricular ejection fraction, by estimation, is 50 to 55%. The left ventricle has low normal function. The left ventricle demonstrates global hypokinesis. Strain imaging was not performed. The left ventricular internal cavity size was normal in size. There is mild left ventricular hypertrophy of the basal-septal segment. Left ventricular diastolic function could not be evaluated. Right Ventricle: The right ventricular size is severely enlarged. No increase in right ventricular wall thickness. Right ventricular systolic function is normal. There is normal pulmonary artery systolic pressure. The tricuspid regurgitant velocity is 2.22 m/s, and with an assumed right atrial pressure of 15 mmHg, the estimated right ventricular systolic pressure is 34.7 mmHg. Left Atrium: Left atrial size was normal in size. Right Atrium: Right atrial size was normal in size. Pericardium: There is no evidence of pericardial effusion. Mitral Valve: The mitral valve is abnormal. There is mild calcification of the mitral valve leaflet(s). Mild mitral valve regurgitation. No evidence of mitral valve stenosis. Tricuspid Valve: The tricuspid valve is normal in structure. Tricuspid valve regurgitation is mild to moderate. No evidence of tricuspid stenosis. Aortic Valve: The aortic valve is tricuspid. There is moderate calcification of the aortic valve. There is moderate thickening of the aortic  valve. Aortic valve regurgitation is trivial. Aortic valve sclerosis/calcification is present, without any evidence of aortic stenosis. Pulmonic Valve: The pulmonic valve was normal in structure. Pulmonic valve regurgitation is not visualized. No evidence of pulmonic stenosis. Aorta: The aortic root is normal in size and structure. Venous: The inferior vena cava is dilated in size with less than 50% respiratory variability, suggesting right atrial pressure of 15 mmHg. IAS/Shunts: No atrial level shunt detected by color flow Doppler. Additional Comments: 3D imaging was not performed.  LEFT VENTRICLE PLAX 2D LVIDd:  4.70 cm      Diastology LVIDs:         3.30 cm      LV e' medial:    6.64 cm/s LV PW:         1.10 cm      LV E/e' medial:  17.5 LV IVS:        1.20 cm      LV e' lateral:   10.40 cm/s LVOT diam:     2.40 cm      LV E/e' lateral: 11.2 LV SV:         91 LV SV Index:   38 LVOT Area:     4.52 cm  LV Volumes (MOD) LV vol d, MOD A2C: 133.0 ml LV vol d, MOD A4C: 107.5 ml LV vol s, MOD A2C: 77.3 ml LV vol s, MOD A4C: 57.1 ml LV SV MOD A2C:     55.7 ml LV SV MOD A4C:     107.5 ml LV SV MOD BP:      54.4 ml RIGHT VENTRICLE            IVC RV S prime:     7.72 cm/s  IVC diam: 2.90 cm TAPSE (M-mode): 1.7 cm LEFT ATRIUM             Index        RIGHT ATRIUM           Index LA diam:        4.80 cm 2.01 cm/m   RA Area:     20.60 cm LA Vol (A2C):   47.8 ml 20.05 ml/m  RA Volume:   65.10 ml  27.31 ml/m LA Vol (A4C):   67.9 ml 28.48 ml/m LA Biplane Vol: 57.6 ml 24.16 ml/m  AORTIC VALVE LVOT Vmax:   95.40 cm/s LVOT Vmean:  60.300 cm/s LVOT VTI:    0.201 m  AORTA Ao Root diam: 3.50 cm Ao Asc diam:  3.50 cm MITRAL VALVE                TRICUSPID VALVE MV Area (PHT): 4.31 cm     TR Peak grad:   19.7 mmHg MV Decel Time: 176 msec     TR Vmax:        222.00 cm/s MV E velocity: 116.00 cm/s                             SHUNTS                             Systemic VTI:  0.20 m                             Systemic Diam:  2.40 cm Armanda Magic MD Electronically signed by Armanda Magic MD Signature Date/Time: 04/18/2023/3:45:13 PM    Final       Scheduled Meds:  chlorhexidine       diltiazem  60 mg Oral Q8H   docusate sodium  100 mg Oral BID   folic acid  1 mg Oral Daily   lactulose  20 g Oral TID   metoprolol tartrate  25 mg Oral BID   senna  1 tablet Oral BID   thiamine  100 mg Oral Daily   Continuous Infusions:  DAPTOmycin 800 mg (04/19/23 1809)  LOS: 4 days   Time spent: 35 minutes   Noralee Stain, DO [facility] 04/20/2023, 12:50 PM   Available via Epic secure chat 7am-7pm After these hours, please refer to coverage provider listed on [email or web address]

## 2023-04-20 NOTE — Progress Notes (Signed)
Pharmacy Antibiotic Note  Jonathan Ibarra is a 58 y.o. male for which pharmacy has been consulted for daptomycin dosing for  concern for septic right knee .  Patient with a history of cirrhosis, TIPS procedure, hepatic encephalopathy, polysubstance abuse. Patient is s/p ORIF of proximal rt tibial plateau presenting with concern for septic knee.  -He is noted with MRSA bacteremia with plans for I&D and hardware removal 2/19. Plans also noted for TEE -SCr 0.95, CK= 14 on 2/18  Plan: Daptomycin 800 mg IV q24h (based on ABW of 94 kg) Monitor WBC, fever, renal function, cultures CK Q Tuesday  Height: 6' 0.01" (182.9 cm) Weight: 118.2 kg (260 lb 9.3 oz) IBW/kg (Calculated) : 77.62  Temp (24hrs), Avg:97.6 F (36.4 C), Min:97.6 F (36.4 C), Max:97.8 F (36.6 C)  Recent Labs  Lab 04/16/23 1439 04/16/23 1459 04/17/23 0252 04/17/23 1037 04/18/23 0706 04/20/23 0320  WBC 10.9*  --  13.7*  --  8.0 6.8  CREATININE 0.90  --  1.13  --  0.97 0.95  LATICACIDVEN  --  2.1* 2.1* 2.9*  --   --     Estimated Creatinine Clearance: 115.2 mL/min (by C-G formula based on SCr of 0.95 mg/dL).    Allergies  Allergen Reactions   Tylenol [Acetaminophen] Other (See Comments)    Impacts liver   Antimicrobials:  Vanc 2/15 > 2/16 Zosyn 2/15 > 2/16 Daptomycin 2/16>>  Microbiology results: 2/15 Bcx: MRSA 2/17 Blood x2- ngtd  Thank you for allowing pharmacy to be a part of this patient's care.  Harland German, PharmD Clinical Pharmacist **Pharmacist phone directory can now be found on [email or web address] ([email or web address]).  Listed under [facility].

## 2023-04-20 NOTE — Progress Notes (Signed)
Pt came back to rm 2 from PACU. Reinitiated tele. VSS. Call bell within reach.   Lawson Radar, RN

## 2023-04-20 NOTE — Anesthesia Postprocedure Evaluation (Signed)
Anesthesia Post Note  Patient: Jonathan Ibarra  Procedure(s) Performed: HARDWARE REMOVAL Knee (Right: Knee)     Patient location during evaluation: PACU Anesthesia Type: General Level of consciousness: awake Pain management: pain level controlled Vital Signs Assessment: post-procedure vital signs reviewed and stable Respiratory status: spontaneous breathing, nonlabored ventilation and respiratory function stable Cardiovascular status: blood pressure returned to baseline and stable Postop Assessment: no apparent nausea or vomiting Anesthetic complications: no   No notable events documented.  Last Vitals:  Vitals:   04/20/23 1220 04/20/23 1252  BP: (!) 157/93 (!) 137/97  Pulse: 88 97  Resp: 10 17  Temp:  36.5 C  SpO2: 94% 92%    Last Pain:  Vitals:   04/20/23 1252  TempSrc: Axillary  PainSc:                  Linton Rump

## 2023-04-20 NOTE — Op Note (Signed)
Orthopaedic Surgery Operative Note (CSN: [id number] ) Date of Surgery: 04/20/2023  Admit Date: 04/16/2023   Diagnoses: Pre-Op Diagnoses: Right knee abscess/infection  Post-Op Diagnosis: Same  Procedures: CPT 20680-Removal of hardware right tibia CPT 27301-Irrigation and debridement of right knee infection/abscess  Surgeons : Primary: Roby Lofts, MD  Assistant: Thyra Breed, PA-C  Location: OR 3   Anesthesia: General   Antibiotics: Schedule IV antibiotics with 1 gm vancomycin powder and 1.2 gm tobramycin powder placed topically   Tourniquet time: None    Estimated Blood Loss: 250 mL  Complications: None  Specimens: ID Type Source Tests Collected by Time Destination  A : right knee abscess Body Fluid PATH Cytology Misc. fluid AEROBIC/ANAEROBIC CULTURE W GRAM STAIN (SURGICAL/DEEP WOUND) Teodoro Jeffreys, Gillie Manners, MD 04/20/2023 1059   B : right knee abscess Body Fluid PATH Cytology Misc. fluid AEROBIC/ANAEROBIC CULTURE W GRAM STAIN (SURGICAL/DEEP WOUND) Roby Lofts, MD 04/20/2023 1100   C : right tibial tissue Tissue PATH Soft tissue AEROBIC/ANAEROBIC CULTURE W GRAM STAIN (SURGICAL/DEEP WOUND) Roby Lofts, MD 04/20/2023 1100      Implants: * No implants in log *   Indications for Surgery: 58 year old male who underwent open reduction internal fixation of his tibial plateau fracture in October 2024.  Unfortunately he developed a postoperative infection requiring 2 I&D's.  He was discharged on oral antibiotics but unfortunately he was noncompliant with follow-up with infectious disease or myself.  He returned to the hospital with swelling and drainage and elevated inflammatory markers.  It was felt that his infection was returned.  I felt that he was indicated for removal of hardware as well as I&D of his right leg abscess and knee.  Risks and benefits were discussed with the patient.  This include but not limited to bleeding, infection, posttraumatic arthritis, need for further  surgery including soft tissue coverage and recurrent infection, given the possibility anesthetic complications.  He agreed to proceed with surgery and consent was obtained.  Operative Findings: 1.  Removal of hardware from right proximal tibia.  Significant abscess formation around the hardware and the local soft tissues that was treated with irrigation debridement. 2.  Arthrotomy made with benign appearance of the knee without significant purulence or fibrinous material. 3.  Curettage of metaphyseal bone for potential infection  Procedure: The patient was identified in the preoperative holding area. Consent was confirmed with the patient and their family and all questions were answered. The operative extremity was marked after confirmation with the patient. he was then brought back to the operating room by our anesthesia colleagues.  He was carefully transferred over to radiolucent flattop table.  He was placed under general anesthetic.  A bump was placed under his operative hip.  The right lower extremity was then prepped and draped in usual sterile fashion.  Timeout was performed to verify the patient, the procedure, and the extremity.  Preoperative antibiotics had already been given.  I was able to express significant purulent fluid from the wound over his anterior knee.  I sent this for culture #1.  I then opened up the wound and encountered significant purulence in the soft tissues just overlying the plate and I sent this along with soft tissue from the area around the plate for culture #2.  I then proceeded to remove the screws from the proximal tibia and remove the plate without difficulty.  There is also another independent screw that was removed.  I then scraped the soft tissue off of the bone and  sent this with the number two culture.  I then used a curette to enter the metaphyseal bone and scraped out some of the soft tissue.  There was not a significant amount of purulence there.  I then  extended my incision proximally to make an arthrotomy into the knee.  I did not encounter any purulence there was some synovial fluid that had a benign appearance to it.  I debrided the synovium which again did not appear to be grossly infected.  I then used pulsatile lavage to thoroughly irrigate the wound with 3 L of normal saline.  Gloves were changed and a gram of vancomycin powder and 1.2 g of tobramycin powder were placed into the wound.  A layered closure of 2-0 Monocryl and 2-0 nylon was used to close the skin.  A Prevena wound VAC was placed over the incision.  Patient was then awoke from anesthesia and taken to the PACU in stable condition.   Debridement type: Excisional Debridement  Side: right  Body Location: Knee  Tools used for debridement: scissors, curette, and rongeur  Pre-debridement Wound size (cm):   N/A  Post-debridement Wound size (cm):   N/A  Debridement depth beyond dead/damaged tissue down to healthy viable tissue: yes  Tissue layer involved: skin, subcutaneous tissue, muscle / fascia  Nature of tissue removed: Necrotic and Purulence  Irrigation volume: 3L     Irrigation fluid type: Normal Saline   Post Op Plan/Instructions: Patient will be weightbearing as tolerated to the right lower extremity.  Will continue with the Prevena wound VAC for another 48 to 72 hours.  Follow-up cultures and continue with IV antibiotics.  We will not plan on any further surgery at this point unless he continues to have severe infection.  I was present and performed the entire surgery.  Thyra Breed, PA-C did assist me throughout the case. An assistant was necessary given the difficulty in approach, maintenance of reduction and ability to instrument the fracture.   Truitt Merle, MD [facility]

## 2023-04-21 ENCOUNTER — Inpatient Hospital Stay (HOSPITAL_COMMUNITY): Payer: Commercial Managed Care - HMO | Admitting: Anesthesiology

## 2023-04-21 ENCOUNTER — Encounter (HOSPITAL_COMMUNITY): Payer: Self-pay | Admitting: Student

## 2023-04-21 ENCOUNTER — Encounter (HOSPITAL_COMMUNITY)
Admission: EM | Disposition: A | Discharge status: Home / Self Care | Payer: Self-pay | Source: Home / Self Care | Attending: Internal Medicine

## 2023-04-21 ENCOUNTER — Inpatient Hospital Stay (HOSPITAL_COMMUNITY): Payer: Self-pay | Admitting: Anesthesiology

## 2023-04-21 ENCOUNTER — Inpatient Hospital Stay (HOSPITAL_COMMUNITY): Payer: Commercial Managed Care - HMO

## 2023-04-21 DIAGNOSIS — I340 Nonrheumatic mitral (valve) insufficiency: Secondary | ICD-10-CM

## 2023-04-21 DIAGNOSIS — B9562 Methicillin resistant Staphylococcus aureus infection as the cause of diseases classified elsewhere: Secondary | ICD-10-CM | POA: Diagnosis not present

## 2023-04-21 DIAGNOSIS — R7881 Bacteremia: Secondary | ICD-10-CM

## 2023-04-21 DIAGNOSIS — I361 Nonrheumatic tricuspid (valve) insufficiency: Secondary | ICD-10-CM

## 2023-04-21 DIAGNOSIS — I10 Essential (primary) hypertension: Secondary | ICD-10-CM

## 2023-04-21 DIAGNOSIS — E669 Obesity, unspecified: Secondary | ICD-10-CM

## 2023-04-21 DIAGNOSIS — I4891 Unspecified atrial fibrillation: Secondary | ICD-10-CM

## 2023-04-21 DIAGNOSIS — M00061 Staphylococcal arthritis, right knee: Secondary | ICD-10-CM | POA: Diagnosis not present

## 2023-04-21 HISTORY — PX: TRANSESOPHAGEAL ECHOCARDIOGRAM (CATH LAB): EP1270

## 2023-04-21 LAB — CBC
HCT: 36.6 % — ABNORMAL LOW (ref 39.0–52.0)
Hemoglobin: 12.2 g/dL — ABNORMAL LOW (ref 13.0–17.0)
MCH: 29.5 pg (ref 26.0–34.0)
MCHC: 33.3 g/dL (ref 30.0–36.0)
MCV: 88.4 fL (ref 80.0–100.0)
Platelets: 239 10*3/uL (ref 150–400)
RBC: 4.14 MIL/uL — ABNORMAL LOW (ref 4.22–5.81)
RDW: 14.7 % (ref 11.5–15.5)
WBC: 8.6 10*3/uL (ref 4.0–10.5)
nRBC: 0 % (ref 0.0–0.2)

## 2023-04-21 LAB — BASIC METABOLIC PANEL
Anion gap: 8 (ref 5–15)
BUN: 23 mg/dL — ABNORMAL HIGH (ref 6–20)
CO2: 26 mmol/L (ref 22–32)
Calcium: 8.8 mg/dL — ABNORMAL LOW (ref 8.9–10.3)
Chloride: 100 mmol/L (ref 98–111)
Creatinine, Ser: 0.84 mg/dL (ref 0.61–1.24)
GFR, Estimated: >60 mL/min (ref >60)
Glucose, Bld: 99 mg/dL (ref 70–99)
Potassium: 4.3 mmol/L (ref 3.5–5.1)
Sodium: 134 mmol/L — ABNORMAL LOW (ref 135–145)

## 2023-04-21 LAB — ECHO TEE

## 2023-04-21 SURGERY — TRANSESOPHAGEAL ECHOCARDIOGRAM (TEE) (CATHLAB)
Anesthesia: Monitor Anesthesia Care

## 2023-04-21 MED ORDER — PROPOFOL 10 MG/ML IV BOLUS
INTRAVENOUS | Status: DC | PRN
  Administered 2023-04-21: 50 ug/kg/min via INTRAVENOUS
  Administered 2023-04-21 (×2): 50 mg via INTRAVENOUS

## 2023-04-21 NOTE — Progress Notes (Addendum)
Patient Name: Jonathan Ibarra Date of Encounter: 04/21/2023 [facility] Cardiologist: New to Dr Burnett Corrente for EP   Interval Summary  .    58 y.o. male with a history of paroxysmal atrial fibrillation diagnosed during admission in 11/2022  (not on anticoagulation given low CHA2DS2-VASc  score and polysubstance abuse), paroxysmal SVT during admission in 09/2022, hypertension, hepatic cirrhosis s/p TIPS, right tibial plateau fracture in 11/2022 with subsequent infection requiring multiple I&Ds, and polysubstance abuse (alcohol, THC, methamphetamines, and cocaine) who was admitted on 04/16/2023 for septic arthritis of right right knee. Cardiology following for atrial fibrillation RVR.   Patient is working with PT today, states he lost track on dates and does not remember he had surgery yesterday this morning. He denied heart palpitation. He denied any chest pain. He does recall having conversation yesterday about TEE. He denied any melena, hematemesis.   Vital Signs .    Vitals:   04/21/23 0320 04/21/23 0322 04/21/23 0610 04/21/23 0746  BP:  109/71 (!) 135/94 104/60  Pulse: 74 77  71  Resp: 17 17    Temp:  97.6 F (36.4 C)  (!) 96.5 F (35.8 C)  TempSrc:  Oral  Oral  SpO2:  96%  96%  Weight:      Height:        Intake/Output Summary (Last 24 hours) at 04/21/2023 0845 Last data filed at 04/21/2023 0300 Gross per 24 hour  Intake 740 ml  Output 1800 ml  Net -1060 ml      04/16/2023    6:19 PM 01/12/2023    9:39 AM 01/02/2023   12:47 PM  Last 3 Weights  Weight (lbs) 260 lb 9.3 oz 260 lb 272 lb 7.8 oz  Weight (kg) 118.2 kg 117.935 kg 123.6 kg      Telemetry/ECG    A fib with ventricular rate of 90s - Personally Reviewed  Physical Exam .   GEN: No acute distress.   Neck: No JVD Cardiac: Irregularly irregular Respiratory: Clear but diminished to base to auscultation bilaterally. GI: Soft, nontender, non-distended  MS: No edema  Assessment & Plan .      Atrial Fibrillation with RVR - Initial diagnosed in 11/2022 during an admission for right knee infection. He was not felt to be a candidate for anticoagulation given polysubstance abuse but CHA2DS2-VASc also 1.  - now admitted for septic arthritis of right knee and noted to be in atrial fibrillation with RVR, TSH elevated 4.8 on 04/16/23, defer further labs to primary team  - Echo 04/18/23 shows LVEF of 50-55% with global hypokinesis and severely enlarged RV but normal RV function. Mild MR. Mild to mod TR, trivial AI - Rate controlled on AVN blocking agents, continue current regimen with Cardizem 60mg  Q8H, Metoprolol 25 mg BID; may convert to Cardizem CD 180 mg and Toprol XL 50mg  at time of DC - CHA2DS2-VASc = 1 (HTN). Not a good candidate/safe for long-term anticoagulation given high risk behavior   MRSA bacteremia/Septic arthritis of right knee  Cirrhosis  Polysubstance abuse - TEE planned today per ID request, PLT 239k, INR 1.4 on most recent labs, no recent GI bleed, s/p TIPS - per primary team   For questions or updates, please contact [facility] Please consult [email or web address] for contact info under        Signed, Cyndi Bender, NP   Attending Note:   The patient was seen and examined.  Agree with assessment and plan as noted above.  Changes made to the above note as needed.  Patient seen and independently examined with Cyndi Bender, NP .   We discussed all aspects of the encounter. I agree with the assessment and plan as stated above.    Atrial fib:  HR is well controlled.  He is not a candidate for anticoagulation .  Has seen Dr. Elberta Fortis for EP    2.   Bacteremia:  TEE shows no evidence of bacterial encarditis.    3.  MR :    4.  Altered Mental status :   difficult for him to stay awake .   Its very difficult to get much history from him    [facility] will sign off.   Medication Recommendations:  per medical team  Other recommendations (labs, testing,  etc):   Follow up as an outpatient:  cardiology       I have spent a total of 40 minutes with patient reviewing hospital  notes , telemetry, EKGs, labs and examining patient as well as establishing an assessment and plan that was discussed with the patient.  > 50% of time was spent in direct patient care.    Vesta Mixer, Montez Hageman., MD, [facility] 04/21/2023, 2:07 PM [address]  [address] Office [phone] Pager [phone]

## 2023-04-21 NOTE — Interval H&P Note (Signed)
History and Physical Interval Note:  04/21/2023 12:48 PM  Jonathan Ibarra  has presented today for surgery, with the diagnosis of bacteremia.  The various methods of treatment have been discussed with the patient and family. After consideration of risks, benefits and other options for treatment, the patient has consented to  Procedure(s): TRANSESOPHAGEAL ECHOCARDIOGRAM (N/A) as a surgical intervention.  The patient's history has been reviewed, patient examined, no change in status, stable for surgery.  I have reviewed the patient's chart and labs.  Questions were answered to the patient's satisfaction.     [name] Cristal Deer

## 2023-04-21 NOTE — CV Procedure (Addendum)
    TRANSESOPHAGEAL ECHOCARDIOGRAM   NAME:  Jonathan Ibarra   MRN: [id number] DOB:  03/25/1965   ADMIT DATE: 04/16/2023  INDICATIONS: Bacteremia  PROCEDURE:   Informed consent was obtained prior to the procedure. The risks, benefits and alternatives for the procedure were discussed and the patient comprehended these risks.  Risks include, but are not limited to, cough, sore throat, vomiting, nausea, somnolence, esophageal and stomach trauma or perforation, bleeding, low blood pressure, aspiration, pneumonia, infection, trauma to the teeth and death.    Procedural time out performed.   Patient received monitored anesthesia care under the supervision of Dr. Richardson Landry. Patient received a total of 188.65 mg propofol during the procedure.    The transesophageal probe was inserted in the esophagus and stomach without difficulty and multiple views were obtained.    COMPLICATIONS:    There were no immediate complications. Technically challenging study, had to be performed supine due to recent surgery.  FINDINGS:  LEFT VENTRICLE: EF = 50-55%, no regional wall motion abnormalities.  RIGHT VENTRICLE: Normal size and function.   LEFT ATRIUM: No thrombus/mass.  LEFT ATRIAL APPENDAGE: No thrombus/mass.   RIGHT ATRIUM: No thrombus/mass.  AORTIC VALVE:  Trileaflet. Focal calcification of noncoronary cusp. Trivial regurgitation. No vegetation.  MITRAL VALVE:  Normal structure. Mild-moderate regurgitation. No vegetation.  TRICUSPID VALVE: Normal structure. Mild-moderate regurgitation. No vegetation.  PULMONIC VALVE: Grossly normal structure. Trivial regurgitation. No apparent vegetation.  INTERATRIAL SEPTUM: No PFO or ASD seen by color Doppler. Negative bubble study  PERICARDIUM: Trivial effusion noted.  DESCENDING AORTA: Mild diffuse plaque seen   CONCLUSION: No evidence of endocarditis.   Jodelle Red, MD, PhD, [facility]  [facility]  [facility] [address], [address] [place], [place] [address] [phone]   1:23 PM

## 2023-04-21 NOTE — Progress Notes (Signed)
PROGRESS NOTE    Darien Kading  WGN:[id number] DOB: 10/21/1965 DOA: 04/16/2023 PCP: Candi Leash, MD     Brief Narrative:  Elman Dettman is a 58 yr old male with PMH significant for Liver cirrhosis, s/p TIPS procedure, Essential hypertension, polysubstance abuse, Atrial Fibrillation , recent right plateau fracture requiring ORIF on 12/13/22.  Patient was hospitalized from 12/27/22 -02/04/2023 with purulent discharge from the right knee, thought to have post operative septic arthritis. Patient was managed with IV ertapenem. Patient was evaluated by infectious disease and patient was discharged on omadacycline. Patient was advised to follow-up with orthopedics and infectious diseases. Patient had undergone irrigation and debridement of right knee three times as an outpatient. Recently has not followed up with orthopedics, now presented with worsening pain and swelling of right knee associated with purulent drainage. Right knee x-ray showed  large joint effusion.  Soft tissue edema.  Orthopedics and infectious disease consulted.  Blood cultures positive for MRSA.  He underwent I&D and hardware removal of his right knee on 2/19.  New events last 24 hours / Subjective: Complains of being hungry.  Awaiting TEE today.  Assessment & Plan:   Principal Problem:   Septic joint of right knee joint (HCC) Active Problems:   Thrombocytopenia (HCC)   Essential hypertension   Hepatic cirrhosis (HCC)   Polysubstance abuse (HCC)   S/P TIPS (transjugular intrahepatic portosystemic shunt)   Atrial fibrillation, chronic (HCC)   MRSA bacteremia   Effusion of right knee joint   Septic arthritis of right knee, MRSA bacteremia -History of previous I&D as outpatient, completed treatment with IV or ertapenem and subsequently omadacycline -Orthopedic surgery consulted, s/p hardware removal and I&D 2/19. Wound vac in place  -Blood cultures positive for MRSA -Infectious disease following -Continue  daptomycin -TEE 2/20  Chronic A-fib -CHA2DS2-VASc 1  -Not on anticoagulation -Metoprolol, Cardizem -Cardiology following  Liver cirrhosis status post TIPS, thrombocytopenia -Lactulose  Polysubstance abuse -Counseling  Obesity -Estimated body mass index is 35.33 kg/m as calculated from the following:   Height as of this encounter: 6' 0.01" (1.829 m).   Weight as of this encounter: 118.2 kg.    DVT prophylaxis:  SCDs Start: 04/20/23 1253 Place and maintain sequential compression device Start: 04/16/23 1702  Code Status: Full code Family Communication: None at bedside Disposition Plan: Home Status is: Inpatient Remains inpatient appropriate because: TEE today     Antimicrobials:  Anti-infectives (From admission, onward)    Start     Dose/Rate Route Frequency Ordered Stop   04/20/23 1109  vancomycin (VANCOCIN) powder  Status:  Discontinued          As needed 04/20/23 1109 04/20/23 1137   04/20/23 1109  tobramycin (NEBCIN) powder  Status:  Discontinued          As needed 04/20/23 1109 04/20/23 1137   04/18/23 1400  DAPTOmycin (CUBICIN) 800 mg in sodium chloride 0.9 % IVPB       Placed in "And" Linked Group   8 mg/kg  93.8 kg (Adjusted) 132 mL/hr over 30 Minutes Intravenous Daily 04/18/23 0754     04/18/23 0500  piperacillin-tazobactam (ZOSYN) IVPB 3.375 g  Status:  Discontinued        3.375 g 12.5 mL/hr over 240 Minutes Intravenous Every 8 hours 04/17/23 1002 04/17/23 1026   04/17/23 2300  piperacillin-tazobactam (ZOSYN) IVPB 3.375 g  Status:  Discontinued       Placed in "Followed by" Linked Group   3.375 g 12.5 mL/hr over 240  Minutes Intravenous Every 8 hours 04/16/23 1609 04/17/23 0007   04/17/23 2100  DAPTOmycin (CUBICIN) 800 mg in sodium chloride 0.9 % IVPB  Status:  Discontinued        8 mg/kg  93.8 kg (Adjusted) 132 mL/hr over 30 Minutes Intravenous Daily 04/17/23 1249 04/18/23 0754   04/17/23 1930  vancomycin (VANCOCIN) IVPB 1000 mg/200 mL premix  Status:   Discontinued        1,000 mg 200 mL/hr over 60 Minutes Intravenous Every 12 hours 04/17/23 1016 04/18/23 0748   04/17/23 0730  vancomycin (VANCOREADY) IVPB 1250 mg/250 mL  Status:  Discontinued        1,250 mg 166.7 mL/hr over 90 Minutes Intravenous Every 12 hours 04/16/23 1846 04/17/23 1016   04/17/23 0500  vancomycin (VANCOREADY) IVPB 1250 mg/250 mL  Status:  Discontinued        1,250 mg 166.7 mL/hr over 90 Minutes Intravenous Every 12 hours 04/16/23 1628 04/16/23 1846   04/17/23 0100  ertapenem (INVANZ) 1 g in sodium chloride 0.9 % 100 mL IVPB        1 g 200 mL/hr over 30 Minutes Intravenous  Once 04/17/23 0008 04/17/23 0516   04/16/23 1615  vancomycin (VANCOREADY) IVPB 2000 mg/400 mL        2,000 mg 200 mL/hr over 120 Minutes Intravenous  Once 04/16/23 1609 04/16/23 2319   04/16/23 1615  piperacillin-tazobactam (ZOSYN) IVPB 3.375 g       Placed in "Followed by" Linked Group   3.375 g 100 mL/hr over 30 Minutes Intravenous  Once 04/16/23 1609 04/16/23 1717   04/16/23 1600  ceFAZolin (ANCEF) IVPB 1 g/50 mL premix        1 g 100 mL/hr over 30 Minutes Intravenous  Once 04/16/23 1554 04/16/23 1807        Objective: Vitals:   04/21/23 0610 04/21/23 0746 04/21/23 1159 04/21/23 1213  BP: (!) 135/94 104/60 113/70 114/71  Pulse:  71  (!) 56  Resp:   13 17  Temp:  (!) 96.5 F (35.8 C) (!) 96.7 F (35.9 C) 98 F (36.7 C)  TempSrc:  Oral Oral Temporal  SpO2:  96%  96%  Weight:      Height:        Intake/Output Summary (Last 24 hours) at 04/21/2023 1219 Last data filed at 04/21/2023 0949 Gross per 24 hour  Intake 240 ml  Output 1750 ml  Net -1510 ml   Filed Weights   04/16/23 1819  Weight: 118.2 kg    Examination:  General exam: Appears calm  Respiratory system: Clear to auscultation. Respiratory effort normal. No respiratory distress. No conversational dyspnea.  Cardiovascular system: S1 & S2 heard, RR rate 60-70s.  Gastrointestinal system: Abdomen is nondistended,  soft  Central nervous system: Alert and oriented.  Extremities: Right LE wound vac in place  Psychiatry: Judgement and insight appear normal.   Data Reviewed: I have personally reviewed following labs and imaging studies  CBC: Recent Labs  Lab 04/16/23 1439 04/17/23 0252 04/18/23 0706 04/20/23 0320 04/21/23 0206  WBC 10.9* 13.7* 8.0 6.8 8.6  NEUTROABS 8.8*  --  5.8  --   --   HGB 14.0 14.0 12.9* 14.2 12.2*  HCT 42.6 44.2 39.6 43.0 36.6*  MCV 89.9 93.4 92.5 89.4 88.4  PLT 124* 164 133* 184 239   Basic Metabolic Panel: Recent Labs  Lab 04/16/23 1439 04/17/23 0252 04/18/23 0706 04/20/23 0320 04/21/23 0206  NA 135 134* 130* 135 134*  K  3.6 4.2 4.2 3.6 4.3  CL 101 99 101 99 100  CO2 23 26 22 24 26   GLUCOSE 95 102* 116* 101* 99  BUN 11 14 26* 31* 23*  CREATININE 0.90 1.13 0.97 0.95 0.84  CALCIUM 9.1 9.0 8.7* 9.4 8.8*  MG  --  1.6* 2.1 2.1  --   PHOS  --  3.2 2.6 3.1  --    GFR: Estimated Creatinine Clearance: 130.3 mL/min (by C-G formula based on SCr of 0.84 mg/dL). Liver Function Tests: Recent Labs  Lab 04/17/23 0252  AST 19  ALT 18  ALKPHOS 91  BILITOT 4.1*  PROT 6.6  ALBUMIN 2.9*   No results for input(s): "LIPASE", "AMYLASE" in the last 168 hours. Recent Labs  Lab 04/16/23 1453  AMMONIA 49*   Coagulation Profile: Recent Labs  Lab 04/20/23 1413  INR 1.4*   Cardiac Enzymes: Recent Labs  Lab 04/19/23 0721  CKTOTAL 14*   BNP (last 3 results) No results for input(s): "PROBNP" in the last 8760 hours. HbA1C: No results for input(s): "HGBA1C" in the last 72 hours. CBG: Recent Labs  Lab 04/16/23 2331  GLUCAP 101*   Lipid Profile: No results for input(s): "CHOL", "HDL", "LDLCALC", "TRIG", "CHOLHDL", "LDLDIRECT" in the last 72 hours. Thyroid Function Tests: No results for input(s): "TSH", "T4TOTAL", "FREET4", "T3FREE", "THYROIDAB" in the last 72 hours. Anemia Panel: No results for input(s): "VITAMINB12", "FOLATE", "FERRITIN", "TIBC", "IRON",  "RETICCTPCT" in the last 72 hours. Sepsis Labs: Recent Labs  Lab 04/16/23 1459 04/17/23 0252 04/17/23 1037  LATICACIDVEN 2.1* 2.1* 2.9*    Recent Results (from the past 240 hours)  Blood culture (routine x 2)     Status: Abnormal (Preliminary result)   Collection Time: 04/16/23  1:44 PM   Specimen: BLOOD  Result Value Ref Range Status   Specimen Description BLOOD SITE NOT SPECIFIED  Final   Special Requests   Final    BOTTLES DRAWN AEROBIC AND ANAEROBIC Blood Culture adequate volume   Culture  Setup Time   Final    GRAM POSITIVE COCCI IN CLUSTERS IN BOTH AEROBIC AND ANAEROBIC BOTTLES CRITICAL RESULT CALLED TO, READ BACK BY AND VERIFIED WITH: PHARMD JENNY ZHOU [id number] BY J RAZZAK, MT    Culture (A)  Final    METHICILLIN RESISTANT STAPHYLOCOCCUS AUREUS Sent to [facility] for further susceptibility testing. Performed at [facility], [address], [place], [place] [address]    Report Status PENDING  Incomplete   Organism ID, Bacteria METHICILLIN RESISTANT STAPHYLOCOCCUS AUREUS  Final      Susceptibility   Methicillin resistant staphylococcus aureus - MIC*    CIPROFLOXACIN >=8 RESISTANT Resistant     ERYTHROMYCIN >=8 RESISTANT Resistant     GENTAMICIN <=0.5 SENSITIVE Sensitive     OXACILLIN >=4 RESISTANT Resistant     TETRACYCLINE >=16 RESISTANT Resistant     VANCOMYCIN <=0.5 SENSITIVE Sensitive     TRIMETH/SULFA 160 RESISTANT Resistant     CLINDAMYCIN <=0.25 SENSITIVE Sensitive     RIFAMPIN <=0.5 SENSITIVE Sensitive     Inducible Clindamycin NEGATIVE Sensitive     LINEZOLID 2 SENSITIVE Sensitive     * METHICILLIN RESISTANT STAPHYLOCOCCUS AUREUS  Blood Culture ID Panel (Reflexed)     Status: Abnormal   Collection Time: 04/16/23  1:44 PM  Result Value Ref Range Status   Enterococcus faecalis NOT DETECTED NOT DETECTED Final   Enterococcus Faecium NOT DETECTED NOT DETECTED Final   Listeria monocytogenes NOT DETECTED NOT DETECTED Final  Staphylococcus species  DETECTED (A) NOT DETECTED Final    Comment: CRITICAL RESULT CALLED TO, READ BACK BY AND VERIFIED WITH: PHARMD JENNY ZHOU [id number] [phone] BY J RAZZAK, MT    Staphylococcus aureus (BCID) DETECTED (A) NOT DETECTED Final    Comment: Methicillin (oxacillin)-resistant Staphylococcus aureus (MRSA). MRSA is predictably resistant to beta-lactam antibiotics (except ceftaroline). Preferred therapy is vancomycin unless clinically contraindicated. Patient requires contact precautions if  hospitalized. CRITICAL RESULT CALLED TO, READ BACK BY AND VERIFIED WITH: PHARMD JENNY ZHOU [id number] 0800 BY J RAZZAK, MT    Staphylococcus epidermidis NOT DETECTED NOT DETECTED Final   Staphylococcus lugdunensis NOT DETECTED NOT DETECTED Final   Streptococcus species NOT DETECTED NOT DETECTED Final   Streptococcus agalactiae NOT DETECTED NOT DETECTED Final   Streptococcus pneumoniae NOT DETECTED NOT DETECTED Final   Streptococcus pyogenes NOT DETECTED NOT DETECTED Final   A.calcoaceticus-baumannii NOT DETECTED NOT DETECTED Final   Bacteroides fragilis NOT DETECTED NOT DETECTED Final   Enterobacterales NOT DETECTED NOT DETECTED Final   Enterobacter cloacae complex NOT DETECTED NOT DETECTED Final   Escherichia coli NOT DETECTED NOT DETECTED Final   Klebsiella aerogenes NOT DETECTED NOT DETECTED Final   Klebsiella oxytoca NOT DETECTED NOT DETECTED Final   Klebsiella pneumoniae NOT DETECTED NOT DETECTED Final   Proteus species NOT DETECTED NOT DETECTED Final   Salmonella species NOT DETECTED NOT DETECTED Final   Serratia marcescens NOT DETECTED NOT DETECTED Final   Haemophilus influenzae NOT DETECTED NOT DETECTED Final   Neisseria meningitidis NOT DETECTED NOT DETECTED Final   Pseudomonas aeruginosa NOT DETECTED NOT DETECTED Final   Stenotrophomonas maltophilia NOT DETECTED NOT DETECTED Final   Candida albicans NOT DETECTED NOT DETECTED Final   Candida auris NOT DETECTED NOT DETECTED Final   Candida glabrata NOT  DETECTED NOT DETECTED Final   Candida krusei NOT DETECTED NOT DETECTED Final   Candida parapsilosis NOT DETECTED NOT DETECTED Final   Candida tropicalis NOT DETECTED NOT DETECTED Final   Cryptococcus neoformans/gattii NOT DETECTED NOT DETECTED Final   Meth resistant mecA/C and MREJ DETECTED (A) NOT DETECTED Final    Comment: CRITICAL RESULT CALLED TO, READ BACK BY AND VERIFIED WITH: PHARMD JENNY ZHOU [id number] BY Berline Chough, MT Performed at [facility], [address] [place], [place] [address]   Culture, blood (Routine X 2) w Reflex to ID Panel     Status: Abnormal   Collection Time: 04/16/23  3:08 PM   Specimen: BLOOD LEFT ARM  Result Value Ref Range Status   Specimen Description BLOOD LEFT ARM  Final   Special Requests   Final    BOTTLES DRAWN AEROBIC AND ANAEROBIC Blood Culture results may not be optimal due to an inadequate volume of blood received in culture bottles   Culture  Setup Time   Final    GRAM POSITIVE COCCI IN CLUSTERS IN BOTH AEROBIC AND ANAEROBIC BOTTLES CRITICAL VALUE NOTED.  VALUE IS CONSISTENT WITH PREVIOUSLY REPORTED AND CALLED VALUE.    Culture (A)  Final    STAPHYLOCOCCUS AUREUS SUSCEPTIBILITIES PERFORMED ON PREVIOUS CULTURE WITHIN THE LAST 5 DAYS. Performed at [facility], [address], [place], [place] [address]    Report Status 04/19/2023 FINAL  Final  Culture, blood (Routine X 2) w Reflex to ID Panel     Status: None (Preliminary result)   Collection Time: 04/18/23  7:06 AM   Specimen: BLOOD RIGHT HAND  Result Value Ref Range Status   Specimen Description BLOOD RIGHT HAND  Final   Special Requests   Final    BOTTLES DRAWN AEROBIC AND ANAEROBIC Blood Culture results may not be optimal due to an inadequate volume of blood received in culture bottles   Culture   Final    NO GROWTH 3 DAYS Performed at [facility], [address] [place], [place] [address]    Report Status PENDING  Incomplete  Culture, blood (Routine X 2) w Reflex  to ID Panel     Status: None (Preliminary result)   Collection Time: 04/18/23  7:08 AM   Specimen: BLOOD  Result Value Ref Range Status   Specimen Description BLOOD RIGHT ANTECUBITAL  Final   Special Requests   Final    BOTTLES DRAWN AEROBIC AND ANAEROBIC Blood Culture results may not be optimal due to an inadequate volume of blood received in culture bottles   Culture   Final    NO GROWTH 3 DAYS Performed at [facility], [address], [place], [place] [address]    Report Status PENDING  Incomplete  Surgical pcr screen     Status: None   Collection Time: 04/20/23  7:36 AM   Specimen: Nasal Mucosa; Nasal Swab  Result Value Ref Range Status   MRSA, PCR NEGATIVE NEGATIVE Final   Staphylococcus aureus NEGATIVE NEGATIVE Final    Comment: (NOTE) The Xpert SA Assay (FDA approved for NASAL specimens in patients 105 years of age and older), is one component of a comprehensive surveillance program. It is not intended to diagnose infection nor to guide or monitor treatment. Performed at [facility], [address] [place], [place] [address]   Aerobic/Anaerobic Culture w Gram Stain (surgical/deep wound)     Status: None (Preliminary result)   Collection Time: 04/20/23 11:00 AM   Specimen: PATH Cytology Misc. fluid; Body Fluid  Result Value Ref Range Status   Specimen Description FLUID  Final   Special Requests NONE  Final   Gram Stain   Final    ABUNDANT WBC PRESENT, PREDOMINANTLY PMN RARE GRAM POSITIVE COCCI IN PAIRS    Culture   Final    RARE STAPHYLOCOCCUS AUREUS CULTURE REINCUBATED FOR BETTER GROWTH SUSCEPTIBILITIES TO FOLLOW Performed at [facility], [address], [place], [place] [address]    Report Status PENDING  Incomplete  Aerobic/Anaerobic Culture w Gram Stain (surgical/deep wound)     Status: None (Preliminary result)   Collection Time: 04/20/23 11:00 AM   Specimen: PATH Soft tissue  Result Value Ref Range Status   Specimen Description FLUID   Final   Special Requests NONE  Final   Gram Stain   Final    RARE WBC PRESENT, PREDOMINANTLY MONONUCLEAR NO ORGANISMS SEEN Performed at [facility], [address] [place], [place] [address]    Culture RARE STAPHYLOCOCCUS AUREUS  Final   Report Status PENDING  Incomplete  Aerobic/Anaerobic Culture w Gram Stain (surgical/deep wound)     Status: None (Preliminary result)   Collection Time: 04/20/23 11:06 AM   Specimen: PATH Cytology Misc. fluid; Body Fluid  Result Value Ref Range Status   Specimen Description FLUID  Final   Special Requests NONE  Final   Gram Stain   Final    ABUNDANT WBC PRESENT, PREDOMINANTLY PMN NO ORGANISMS SEEN    Culture   Final    NO GROWTH < 24 HOURS Performed at [facility], [address], [place], [place] [address]    Report Status PENDING  Incomplete      Radiology Studies: DG Knee 1-2 Views Right Result Date: 04/20/2023 CLINICAL DATA:  Hardware removal right knee. Intraoperative fluoroscopy. EXAM: RIGHT KNEE - 1-2 VIEW COMPARISON:  Right knee radiographs 04/16/2023, CT right knee 04/18/2023 FINDINGS: Images were performed intraoperatively without the presence of a radiologist. The patient is undergoing removal of the prior proximal tibial lateral plate and screw hardware. Depressed lateral tibial plateau fracture again noted. Total fluoroscopy images: 2 Total fluoroscopy time: 3 seconds Total dose: Radiation Exposure Index (as provided by the fluoroscopic device): 0.26 mGy air Kerma Please see intraoperative findings for further detail. IMPRESSION: Intraoperative fluoroscopy for hardware removal. Electronically Signed   By: Neita Garnet M.D.   On: 04/20/2023 12:20   DG C-Arm 1-60 Min-No Report Result Date: 04/20/2023 Fluoroscopy was utilized by the requesting physician.  No radiographic interpretation.      Scheduled Meds:  diltiazem  60 mg Oral Q8H   docusate sodium  100 mg Oral BID   folic acid  1 mg Oral Daily   ketorolac  15 mg  Intravenous Q6H   lactulose  20 g Oral TID   metoprolol tartrate  25 mg Oral BID   senna  1 tablet Oral BID   sodium chloride flush  3-10 mL Intravenous Q12H   thiamine  100 mg Oral Daily   Continuous Infusions:  DAPTOmycin 800 mg (04/20/23 1503)     LOS: 5 days   Time spent: 25 minutes   Noralee Stain, DO [facility] 04/21/2023, 12:19 PM   Available via Epic secure chat 7am-7pm After these hours, please refer to coverage provider listed on [email or web address]

## 2023-04-21 NOTE — Evaluation (Signed)
Occupational Therapy Evaluation Patient Details Name: Jonathan Ibarra MRN: [id number] DOB: February 11, 1966 Today's Date: 04/21/2023   History of Present Illness   Pt is a 58 yr old male who presented on 04/16/23 due to increase in edema and pain in r knee following a fall. Pt s/p 04/20/23 removal of hardware, I & D of R knee. Pt R LE WBAT with wound vac. PMH: liver cirrhosis, s/p TIPS, HTN, polysubstance abuse, afib, R plateau fx requiring ORIF on 12/13/22 and has had irrigation and debridement of R knee in OP setting     Clinical Impressions Pt at this time presented in bed and did not recall about having sx and did not note about having wound vac at this time. He completed supine to sitting with min guard and min assist sitting to supine for RLE. He decline transfers at this time as reported he could not complete but agreeable to complete sliding transfers with min guard at EOB. Pt lives with friends but not consistent assist at this time. Recommendation from continued inpatient follow up therapy, <3 hours/day. However, if he progress may be able to decrease down to [facility] services.       If plan is discharge home, recommend the following:   [name];Two people to help with bathing/dressing/bathroom;Assistance with cooking/housework;Assistance with feeding;Assist for transportation     Functional Status Assessment   Patient has had a recent decline in their functional status and demonstrates the ability to make significant improvements in function in a reasonable and predictable amount of time.     Equipment Recommendations    (TBD at this time)     Recommendations for Other Services         Precautions/Restrictions   Precautions Precautions: Knee;Fall Precaution/Restrictions Comments: wound vac Restrictions Weight Bearing Restrictions Per Provider Order: No     Mobility Bed Mobility Overal bed mobility: Needs Assistance Bed  Mobility: Supine to Sit, Sit to Supine     Supine to sit: Contact guard Sit to supine: Min assist (with RLE)        Transfers                   General transfer comment: Pt decline all transfers at this time      Balance Overall balance assessment: Needs assistance Sitting-balance support: Feet supported, Bilateral upper extremity supported Sitting balance-Leahy Scale: Fair                                     ADL either performed or assessed with clinical judgement   ADL Overall ADL's : Needs assistance/impaired Eating/Feeding: NPO   Grooming: Wash/dry hands;Wash/dry face;Set up;Sitting   Upper Body Bathing: Set up;Sitting   Lower Body Bathing: Minimal assistance;Sitting/lateral leans   Upper Body Dressing : Set up;Sitting   Lower Body Dressing: Minimal assistance;Sit to/from stand                       Vision Baseline Vision/History: 0 No visual deficits Ability to See in Adequate Light: 0 Adequate Patient Visual Report: No change from baseline Vision Assessment?: No apparent visual deficits     Perception Perception: Within Functional Limits       Praxis Praxis: WFL       Pertinent Vitals/Pain Pain Assessment Pain Assessment: 0-10 Pain Score: 10-Worst pain ever Pain Descriptors / Indicators: Aching, Grimacing, Discomfort Pain Intervention(s): Limited activity  within patient's tolerance, Monitored during session, Repositioned     Extremity/Trunk Assessment Upper Extremity Assessment Upper Extremity Assessment: Overall WFL for tasks assessed   Lower Extremity Assessment Lower Extremity Assessment: Defer to PT evaluation   Cervical / Trunk Assessment Cervical / Trunk Assessment: Normal   Communication Communication Communication: No apparent difficulties   Cognition Arousal: Lethargic Behavior During Therapy: WFL for tasks assessed/performed Cognition: Cognition impaired   Orientation impairments: Situation    Memory impairment (select all impairments): Short-term memory, Working memory Attention impairment (select first level of impairment): Focused attention Executive functioning impairment (select all impairments): Sequencing, Reasoning, Problem solving OT - Cognition Comments: Pt could not recall surgery yesterday and wound vac placement at this time                 Following commands: Impaired Following commands impaired: Follows one step commands inconsistently     Cueing  General Comments   Cueing Techniques: Verbal cues;Gestural cues;Tactile cues;Visual cues      Exercises     Shoulder Instructions      Home Living Family/patient expects to be discharged to:: Private residence Living Arrangements: Non-relatives/Friends Available Help at Discharge: Available PRN/intermittently;Friend(s) Type of Home: House Home Access: Stairs to enter [employer] of Steps: 2 Entrance Stairs-Rails: Right Home Layout: One level     Bathroom Shower/Tub: Chief Strategy Officer: Standard     Home Equipment: Cane - single Librarian, academic (2 wheels);BSC/3in1;Wheelchair - manual          Prior Functioning/Environment Prior Level of Function : Independent/Modified Independent;History of Falls (last six months)             Mobility Comments: with cane in L hand ADLs Comments: ind    OT Problem List: Decreased strength;Decreased range of motion;Decreased activity tolerance;Impaired balance (sitting and/or standing);Decreased safety awareness;Decreased knowledge of use of DME or AE;Pain   OT Treatment/Interventions: Self-care/ADL training;Therapeutic exercise;Energy conservation;DME and/or AE instruction;Balance training;Patient/family education      OT Goals(Current goals can be found in the care plan section)   Acute Rehab OT Goals Patient Stated Goal: Pt did not report OT Goal Formulation: With patient Time For Goal Achievement:  05/05/23 Potential to Achieve Goals: Good   OT Frequency:  Min 1X/week    Co-evaluation              AM-PAC OT "6 Clicks" Daily Activity     Outcome Measure Help from another person eating meals?: Total Help from another person taking care of personal grooming?: A Little Help from another person toileting, which includes using toliet, bedpan, or urinal?: A Little Help from another person bathing (including washing, rinsing, drying)?: A Lot Help from another person to put on and taking off regular upper body clothing?: A Little Help from another person to put on and taking off regular lower body clothing?: A Lot 6 Click Score: 14   End of Session Equipment Utilized During Treatment: Gait belt Nurse Communication: Mobility status  Activity Tolerance: Patient limited by pain Patient left: in bed;with call bell/phone within reach;with bed alarm set  OT Visit Diagnosis: Unsteadiness on feet (R26.81);Other abnormalities of gait and mobility (R26.89);Repeated falls (R29.6);Muscle weakness (generalized) (M62.81);Pain Pain - Right/Left: Right Pain - part of body: Knee                Time: 8413-2440 OT Time Calculation (min): 34 min Charges:  OT General Charges $OT Visit: 1 Visit OT Evaluation $OT Eval Moderate Complexity: 1 Mod OT Treatments $  Self Care/Home Management : 8-22 mins  Presley Raddle OTR/L  Acute Rehab Services  [phone] office number   Alphia Moh 04/21/2023, 9:20 AM

## 2023-04-21 NOTE — Anesthesia Procedure Notes (Signed)
Procedure Name: MAC Date/Time: 04/21/2023 12:56 PM  Performed by: Bartholomew Crews, CRNAPre-anesthesia Checklist: Patient identified, Emergency Drugs available, Suction available, Patient being monitored and Timeout performed Patient Re-evaluated:Patient Re-evaluated prior to induction Preoxygenation: Pre-oxygenation with 100% oxygen Induction Type: IV induction Placement Confirmation: positive ETCO2 Dental Injury: Teeth and Oropharynx as per pre-operative assessment

## 2023-04-21 NOTE — Progress Notes (Signed)
[facility]    Date of Admission:  04/16/2023   Total days of antibiotics 6         ID: Jonathan Ibarra is a 58 y.o. male with  Principal Problem:   Septic joint of right knee joint (HCC) Active Problems:   Thrombocytopenia (HCC)   Essential hypertension   Hepatic cirrhosis (HCC)   Polysubstance abuse (HCC)   S/P TIPS (transjugular intrahepatic portosystemic shunt)   Atrial fibrillation, chronic (HCC)   MRSA bacteremia   Effusion of right knee joint    Subjective: Underwent HW removal and I x D of right knee. Abscess debrided. Still remains afebrile. Undergoing TEE today  Medications:   [MAR Hold] diltiazem  60 mg Oral Q8H   [MAR Hold] docusate sodium  100 mg Oral BID   [MAR Hold] folic acid  1 mg Oral Daily   [MAR Hold] ketorolac  15 mg Intravenous Q6H   [MAR Hold] lactulose  20 g Oral TID   [MAR Hold] metoprolol tartrate  25 mg Oral BID   [MAR Hold] senna  1 tablet Oral BID   [MAR Hold] sodium chloride flush  3-10 mL Intravenous Q12H   [MAR Hold] thiamine  100 mg Oral Daily    Objective: Vital signs in last 24 hours: Temp:  [96.5 F (35.8 C)-98 F (36.7 C)] 98 F (36.7 C) (02/20 1213) Pulse Rate:  [56-92] 56 (02/20 1213) Resp:  [13-20] 17 (02/20 1213) BP: (104-135)/(60-94) 114/71 (02/20 1213) SpO2:  [93 %-97 %] 96 % (02/20 1213) Physical Exam  Constitutional: He is oriented to person, place, and time. He appears well-developed and well-nourished. No distress.  HENT:  Mouth/Throat: Oropharynx is clear and moist. No oropharyngeal exudate.  Cardiovascular: Normal rate, regular rhythm and normal heart sounds. Exam reveals no gallop and no friction rub.  No murmur heard.  Pulmonary/Chest: Effort normal and breath sounds normal. No respiratory distress. He has no wheezes.  Abdominal: Soft. Bowel sounds are normal. He exhibits no distension. There is no tenderness.  XBJ:YNWGN knee wrapped  Neurological: He is alert and oriented to  person, place, and time.  Skin: Skin is warm and dry. No rash noted. No erythema. "Hick life" tattooed on knuckles Psychiatric: He has a normal mood and affect. His behavior is normal.    Lab Results Recent Labs    04/20/23 0320 04/21/23 0206  WBC 6.8 8.6  HGB 14.2 12.2*  HCT 43.0 36.6*  NA 135 134*  K 3.6 4.3  CL 99 100  CO2 24 26  BUN 31* 23*  CREATININE 0.95 0.84   Liver Panel No results for input(s): "PROT", "ALBUMIN", "AST", "ALT", "ALKPHOS", "BILITOT", "BILIDIR", "IBILI" in the last 72 hours. Sedimentation Rate No results for input(s): "ESRSEDRATE" in the last 72 hours. C-Reactive Protein No results for input(s): "CRP" in the last 72 hours.  Microbiology: 2/17 blood cx ngtd 2/15 blood cx MRSA       Methicillin resistant staphylococcus aureus      MIC    CIPROFLOXACIN >=8 RESISTANT Resistant    CLINDAMYCIN <=0.25 SENS... Sensitive    ERYTHROMYCIN >=8 RESISTANT Resistant    GENTAMICIN <=0.5 SENSI... Sensitive    Inducible Clindamycin NEGATIVE Sensitive    LINEZOLID 2 SENSITIVE Sensitive    OXACILLIN >=4 RESISTANT Resistant    RIFAMPIN <=0.5 SENSI... Sensitive    TETRACYCLINE >=16 RESIST... Resistant    TRIMETH/SULFA 160 RESISTANT Resistant    VANCOMYCIN <=0.5 SENSI... Sensitive     Studies/Results:  DG Knee 1-2 Views Right Result Date: 04/20/2023 CLINICAL DATA:  Hardware removal right knee. Intraoperative fluoroscopy. EXAM: RIGHT KNEE - 1-2 VIEW COMPARISON:  Right knee radiographs 04/16/2023, CT right knee 04/18/2023 FINDINGS: Images were performed intraoperatively without the presence of a radiologist. The patient is undergoing removal of the prior proximal tibial lateral plate and screw hardware. Depressed lateral tibial plateau fracture again noted. Total fluoroscopy images: 2 Total fluoroscopy time: 3 seconds Total dose: Radiation Exposure Index (as provided by the fluoroscopic device): 0.26 mGy air Kerma Please see intraoperative findings for further detail.  IMPRESSION: Intraoperative fluoroscopy for hardware removal. Electronically Signed   By: Neita Garnet M.D.   On: 04/20/2023 12:20   DG C-Arm 1-60 Min-No Report Result Date: 04/20/2023 Fluoroscopy was utilized by the requesting physician.  No radiographic interpretation.     Assessment/Plan: Complicated MRSA bacteremia likely from septic right knee HW s/p removal 2/19, currently on daptomycin. = recommend to continue with daptomycin, check weekly CK. We will use day 1 as today.  If TEE is negative, we will plan to do 2 wk of daptomycin while he is hospitalized since poor candidate for picc line. Until march 5th, then we will give a dose of dalbavancin. Then can arrange for 2nd dose of dalbavancin 1 week later vs. start 3 wks of linezolid on march 12th.  If TEE is positive, patient would need to stay for 4 wk for daptomycin then given dalbavancin   Both course should aim for 6 wk of treatment since 2/19.  [facility] Pager: [phone]  04/21/2023, 1:26 PM

## 2023-04-21 NOTE — Progress Notes (Cosign Needed Addendum)
Orthopaedic Trauma Progress Note  SUBJECTIVE: Sleeping in bed this morning.  Wound VAC functioning well.  Scheduled for TEE later today.  Intraoperative wound cultures growing Staph aureus  OBJECTIVE:  Vitals:   04/21/23 0610 04/21/23 0746  BP: (!) 135/94 104/60  Pulse:  71  Resp:    Temp:  (!) 96.5 F (35.8 C)  SpO2:  96%    General: Sleeping.  No acute distress  respiratory: No increased work of breathing.  Right lower extremity: Incisional VAC in place with good seal and function, about 100 mL serosanguineous fluid in wound VAC canister.  Swelling through the cast present but stable.  Compartments compressible.  2+ DP pulse  IMAGING: Intraoperative imaging confirmed removal of all hardware   LABS:  Results for orders placed or performed during the hospital encounter of 04/16/23 (from the past 24 hours)  Aerobic/Anaerobic Culture w Gram Stain (surgical/deep wound)     Status: None (Preliminary result)   Collection Time: 04/20/23 11:00 AM   Specimen: PATH Cytology Misc. fluid; Body Fluid  Result Value Ref Range   Specimen Description FLUID    Special Requests NONE    Gram Stain      ABUNDANT WBC PRESENT, PREDOMINANTLY PMN RARE GRAM POSITIVE COCCI IN PAIRS    Culture      RARE STAPHYLOCOCCUS AUREUS CULTURE REINCUBATED FOR BETTER GROWTH SUSCEPTIBILITIES TO FOLLOW Performed at [facility], [address] [place], [place] [address]    Report Status PENDING   Aerobic/Anaerobic Culture w Gram Stain (surgical/deep wound)     Status: None (Preliminary result)   Collection Time: 04/20/23 11:00 AM   Specimen: PATH Soft tissue  Result Value Ref Range   Specimen Description FLUID    Special Requests NONE    Gram Stain      RARE WBC PRESENT, PREDOMINANTLY MONONUCLEAR NO ORGANISMS SEEN Performed at [facility], [address], [place], [place] [address]    Culture RARE STAPHYLOCOCCUS AUREUS    Report Status PENDING   Aerobic/Anaerobic Culture w Gram Stain  (surgical/deep wound)     Status: None (Preliminary result)   Collection Time: 04/20/23 11:06 AM   Specimen: PATH Cytology Misc. fluid; Body Fluid  Result Value Ref Range   Specimen Description FLUID    Special Requests NONE    Gram Stain      ABUNDANT WBC PRESENT, PREDOMINANTLY PMN NO ORGANISMS SEEN    Culture      NO GROWTH < 24 HOURS Performed at [facility], [address] [place], [place] [address]    Report Status PENDING   Protime-INR     Status: Abnormal   Collection Time: 04/20/23  2:13 PM  Result Value Ref Range   Prothrombin Time 17.7 (H) 11.4 - 15.2 seconds   INR 1.4 (H) 0.8 - 1.2  Basic metabolic panel     Status: Abnormal   Collection Time: 04/21/23  2:06 AM  Result Value Ref Range   Sodium 134 (L) 135 - 145 mmol/L   Potassium 4.3 3.5 - 5.1 mmol/L   Chloride 100 98 - 111 mmol/L   CO2 26 22 - 32 mmol/L   Glucose, Bld 99 70 - 99 mg/dL   BUN 23 (H) 6 - 20 mg/dL   Creatinine, Ser 2.13 0.61 - 1.24 mg/dL   Calcium 8.8 (L) 8.9 - 10.3 mg/dL   GFR, Estimated >08 >65 mL/min   Anion gap 8 5 - 15  CBC     Status:  Abnormal   Collection Time: 04/21/23  2:06 AM  Result Value Ref Range   WBC 8.6 4.0 - 10.5 K/uL   RBC 4.14 (L) 4.22 - 5.81 MIL/uL   Hemoglobin 12.2 (L) 13.0 - 17.0 g/dL   HCT 86.5 (L) 78.4 - 69.6 %   MCV 88.4 80.0 - 100.0 fL   MCH 29.5 26.0 - 34.0 pg   MCHC 33.3 30.0 - 36.0 g/dL   RDW 29.5 28.4 - 13.2 %   Platelets 239 150 - 400 K/uL   nRBC 0.0 0.0 - 0.2 %    ASSESSMENT: Jonathan Ibarra is a 58 y.o. male 1 day postop s/p  HARDWARE REMOVAL RIGHT TIBIAL PLATEAU IRRIGATION DEBRIDEMENT RIGHT KNEE  CV/Blood loss: Acute blood loss anemia.  Hemoglobin 12.2 this morning. Hemodynamically stable  PLAN: Weightbearing: WBAT RLE ROM: Motion as tolerated Incisional and dressing care: Will maintain incisional wound VAC x 48 to 72 hours postoperatively  Showering: Hold off on showering until wound VAC removed  Orthopedic device(s): Incisional VAC  RLE Pain management: Continue multimodal pain control  VTE prophylaxis: Aspirin, SCDs ID: Per infectious disease team Foley/Lines:  No foley, KVO IVFs Impediments to Fracture Healing: Infection Dispo: PT/OT evaluation.  Continue IV antibiotics per infectious disease.  Will maintain incisional VAC to right lower extremity for 48 to 72 hours postoperatively pending output.  No further surgeries planned from orthopedic standpoint.  Follow - up plan: 2 weeks after discharge for wound check and repeat x-rays   Contact information:  Truitt Merle MD, Thyra Breed PA-C. After hours and holidays please check [email or web address] for group call information for [facility]   Thompson Caul, PA-C [phone] (office) [email or web address]

## 2023-04-21 NOTE — Anesthesia Postprocedure Evaluation (Signed)
Anesthesia Post Note  Patient: Jonathan Ibarra  Procedure(s) Performed: TRANSESOPHAGEAL ECHOCARDIOGRAM     Patient location during evaluation: PACU Anesthesia Type: MAC Level of consciousness: awake and alert Pain management: pain level controlled Vital Signs Assessment: post-procedure vital signs reviewed and stable Respiratory status: spontaneous breathing, nonlabored ventilation and respiratory function stable Cardiovascular status: stable and blood pressure returned to baseline Postop Assessment: no apparent nausea or vomiting Anesthetic complications: no   No notable events documented.  Last Vitals:  Vitals:   04/21/23 1330 04/21/23 1350  BP: (P) 114/84 116/68  Pulse: (P) 64 68  Resp: (!) (P) 22 20  Temp: (P) 36.7 C (!) 36.3 C  SpO2: (P) 97% 98%    Last Pain:  Vitals:   04/21/23 1350  TempSrc: Oral  PainSc:                  Collene Schlichter

## 2023-04-21 NOTE — Evaluation (Signed)
Physical Therapy Evaluation Patient Details Name: Jonathan Ibarra MRN: [id number] DOB: 1966-01-11 Today's Date: 04/21/2023  History of Present Illness  Pt is a 58 yr old male who presented on 04/16/23 due to increase in edema and pain in r knee following a fall. Pt s/p 04/20/23 removal of hardware, I & D of R knee. Pt R LE WBAT with wound vac. PMH: liver cirrhosis, s/p TIPS, HTN, polysubstance abuse, afib, R plateau fx requiring ORIF on 12/13/22 and has had irrigation and debridement of R knee in OP setting  Clinical Impression  Pt is presenting below baseline level of functioning. Currently pt is CGA for bed mobility, Min A for sit to stand at EOB with Min A for side stepping that is limited by fatigue and decreased WB through the RLE with heavy UE support on RW. Due to pt current functional status, home set up and available assistance at home recommending skilled physical therapy services < 3 hours/day in order to address strength, balance and functional mobility to decrease risk for falls, injury, immobility, skin break down and re-hospitalization.          If plan is discharge home, recommend the following: A lot of help with walking and/or transfers;Assistance with cooking/housework;Assist for transportation;Help with stairs or ramp for entrance   Can travel by private vehicle   No    Equipment Recommendations Wheelchair (measurements PT);Wheelchair cushion (measurements PT)     Functional Status Assessment Patient has had a recent decline in their functional status and demonstrates the ability to make significant improvements in function in a reasonable and predictable amount of time.     Precautions / Restrictions Precautions Precautions: Knee;Fall Precaution/Restrictions Comments: wound vac Restrictions Weight Bearing Restrictions Per Provider Order: No Other Position/Activity Restrictions: WBAT      Mobility  Bed Mobility Overal bed mobility: Needs Assistance Bed  Mobility: Supine to Sit, Sit to Supine     Supine to sit: Contact guard Sit to supine: Contact guard assist   General bed mobility comments: Increased time, tactile cue initially to get pt initiation for movement to EOB    Transfers Overall transfer level: Needs assistance Equipment used: Rolling walker (2 wheels) Transfers: Sit to/from Stand Sit to Stand: Min assist, From elevated surface           General transfer comment: Min A due to unsteady when standing from elevated EOB. Initially pt kept scooting closer and closer to EOB without standing. Cues for safety to stop scootiong to EOB. pt then stood pulling up from RW requiring steadying by PT after multi modal cues for correct hand placement for safety with standing. Pt then was intermittently taking hand off RW initially with poor balance requiring Min A to prevent fall.    Ambulation/Gait       Pre-gait activities: side steps by EOB with RW with Min A with forefoot WB on the RLE with ~25% wgt and heavy UE support      Balance Overall balance assessment: Needs assistance Sitting-balance support: Feet supported, Bilateral upper extremity supported Sitting balance-Leahy Scale: Fair     Standing balance support: Bilateral upper extremity supported, Single extremity supported, During functional activity, Reliant on assistive device for balance Standing balance-Leahy Scale: Poor Standing balance comment: Min A to prevent falls and loss of balance         Pertinent Vitals/Pain Pain Assessment Pain Assessment: No/denies pain (pt keeps stating progressing the pain. Pt was asked if this meant he had pain pt stated "no"  asked if it meant he wanted pain meds pt stated "no" Pt stated he had no pain.)    Home Living Family/patient expects to be discharged to:: Private residence Living Arrangements: Non-relatives/Friends Available Help at Discharge: Available PRN/intermittently;Friend(s) Type of Home: House Home Access:  Stairs to enter Entrance Stairs-Rails: Right Entrance Stairs-Number of Steps: 2   Home Layout: One level Home Equipment: Cane - single Librarian, academic (2 wheels);BSC/3in1;Wheelchair - manual      Prior Function Prior Level of Function : Independent/Modified Independent;History of Falls (last six months)         Mobility Comments: with cane in L hand ADLs Comments: ind     Extremity/Trunk Assessment   Upper Extremity Assessment Upper Extremity Assessment: Defer to OT evaluation    Lower Extremity Assessment Lower Extremity Assessment: RLE deficits/detail RLE Deficits / Details: wound VAC in the R knee, recent hard ware removal    Cervical / Trunk Assessment Cervical / Trunk Assessment: Normal  Communication   Communication Communication: No apparent difficulties    Cognition Arousal: Lethargic Behavior During Therapy: WFL for tasks assessed/performed   PT - Cognitive impairments: No family/caregiver present to determine baseline   Following commands impaired: Only follows one step commands consistently     Cueing Cueing Techniques: Verbal cues, Gestural cues, Tactile cues, Visual cues     General Comments General comments (skin integrity, edema, etc.): Pt wound vac is intact. HR O2 sats WNL throughout session on room air        Assessment/Plan    PT Assessment Patient needs continued PT services  PT Problem List Decreased strength;Decreased balance;Decreased cognition;Decreased mobility;Decreased activity tolerance;Decreased safety awareness       PT Treatment Interventions DME instruction;Therapeutic activities;Gait training;Therapeutic exercise;Stair training;Balance training;Functional mobility training;Patient/family education    PT Goals (Current goals can be found in the Care Plan section)  Acute Rehab PT Goals Patient Stated Goal: to improve mobility and return home. PT Goal Formulation: With patient Time For Goal Achievement:  05/05/23 Potential to Achieve Goals: Good    Frequency Min 1X/week        AM-PAC PT "6 Clicks" Mobility  Outcome Measure Help needed turning from your back to your side while in a flat bed without using bedrails?: A Little Help needed moving from lying on your back to sitting on the side of a flat bed without using bedrails?: A Little Help needed moving to and from a bed to a chair (including a wheelchair)?: A Little Help needed standing up from a chair using your arms (e.g., wheelchair or bedside chair)?: A Little Help needed to walk in hospital room?: A Lot Help needed climbing 3-5 steps with a railing? : Total 6 Click Score: 15    End of Session Equipment Utilized During Treatment: Gait belt Activity Tolerance: Patient tolerated treatment well;Patient limited by fatigue Patient left: in bed;with call bell/phone within reach;with bed alarm set Nurse Communication: Mobility status PT Visit Diagnosis: Unsteadiness on feet (R26.81);Other abnormalities of gait and mobility (R26.89)    Time: 0981-1914 PT Time Calculation (min) (ACUTE ONLY): 17 min   Charges:   PT Evaluation $PT Eval Low Complexity: 1 Low   PT General Charges $$ ACUTE PT VISIT: 1 Visit         Harrel Carina, DPT, CLT  Acute Rehabilitation Services Office: [phone] (Secure chat preferred)   Claudia Desanctis 04/21/2023, 4:01 PM

## 2023-04-21 NOTE — Progress Notes (Signed)
 Pt came back to rm 2 from cath lab. Reinitiated tele. Vss. Call bell within reach.   Lawson Radar, RN

## 2023-04-21 NOTE — Transfer of Care (Signed)
Immediate Anesthesia Transfer of Care Note  Patient: Jonathan Ibarra  Procedure(s) Performed: TRANSESOPHAGEAL ECHOCARDIOGRAM  Patient Location: Short Stay  Anesthesia Type:General  Level of Consciousness: sedated  Airway & Oxygen Therapy: Patient Spontanous Breathing  Post-op Assessment: Report given to RN  Post vital signs: Reviewed and stable see chart  Last Vitals:  Vitals Value Taken Time  BP    Temp    Pulse    Resp    SpO2      Last Pain:  Vitals:   04/21/23 1225  TempSrc:   PainSc: 10-Worst pain ever      Patients Stated Pain Goal: 0 (04/21/23 1225)  Complications: No notable events documented.

## 2023-04-22 DIAGNOSIS — M00061 Staphylococcal arthritis, right knee: Secondary | ICD-10-CM | POA: Diagnosis not present

## 2023-04-22 DIAGNOSIS — R7881 Bacteremia: Secondary | ICD-10-CM | POA: Diagnosis not present

## 2023-04-22 DIAGNOSIS — B9562 Methicillin resistant Staphylococcus aureus infection as the cause of diseases classified elsewhere: Secondary | ICD-10-CM | POA: Diagnosis not present

## 2023-04-22 LAB — CBC
HCT: 36.4 % — ABNORMAL LOW (ref 39.0–52.0)
Hemoglobin: 12 g/dL — ABNORMAL LOW (ref 13.0–17.0)
MCH: 29.6 pg (ref 26.0–34.0)
MCHC: 33 g/dL (ref 30.0–36.0)
MCV: 89.9 fL (ref 80.0–100.0)
Platelets: 314 10*3/uL (ref 150–400)
RBC: 4.05 MIL/uL — ABNORMAL LOW (ref 4.22–5.81)
RDW: 14.7 % (ref 11.5–15.5)
WBC: 13.9 10*3/uL — ABNORMAL HIGH (ref 4.0–10.5)
nRBC: 0 % (ref 0.0–0.2)

## 2023-04-22 MED ORDER — LACTULOSE 10 GM/15ML PO SOLN
30.0000 g | Freq: Three times a day (TID) | ORAL | Status: DC
  Administered 2023-04-22 – 2023-05-03 (×19): 30 g via ORAL
  Filled 2023-04-22 (×29): qty 45

## 2023-04-22 MED ORDER — METOPROLOL SUCCINATE ER 50 MG PO TB24
50.0000 mg | ORAL_TABLET | Freq: Every day | ORAL | Status: DC
  Administered 2023-04-23 – 2023-05-03 (×11): 50 mg via ORAL
  Filled 2023-04-22 (×11): qty 1

## 2023-04-22 MED ORDER — DILTIAZEM HCL ER COATED BEADS 180 MG PO CP24
180.0000 mg | ORAL_CAPSULE | Freq: Every day | ORAL | Status: DC
  Administered 2023-04-23 – 2023-05-03 (×11): 180 mg via ORAL
  Filled 2023-04-22 (×11): qty 1

## 2023-04-22 NOTE — Progress Notes (Signed)
PROGRESS NOTE    Jonathan Ibarra  EAV:[id number] DOB: [birth year] DOA: 04/16/2023 PCP: Candi Leash, MD     Brief Narrative:  Jonathan Ibarra is a 58 yr old male with PMH significant for Liver cirrhosis, s/p TIPS procedure, Essential hypertension, polysubstance abuse, Atrial Fibrillation , recent right plateau fracture requiring ORIF on 12/13/22.  Patient was hospitalized from 12/27/22 -02/04/2023 with purulent discharge from the right knee, thought to have post operative septic arthritis. Patient was managed with IV ertapenem. Patient was evaluated by infectious disease and patient was discharged on omadacycline. Patient was advised to follow-up with orthopedics and infectious diseases. Patient had undergone irrigation and debridement of right knee three times as an outpatient. Recently has not followed up with orthopedics, now presented with worsening pain and swelling of right knee associated with purulent drainage. Right knee x-ray showed  large joint effusion.  Soft tissue edema.  Orthopedics and infectious disease consulted.  Blood cultures positive for MRSA.  He underwent I&D and hardware removal of his right knee on 2/19.  He underwent TEE on 2/20 which was negative for endocarditis.  New events last 24 hours / Subjective: Doing well overall, no complaints today.  Has not had a bowel movement in several days  Assessment & Plan:   Principal Problem:   Septic joint of right knee joint (HCC) Active Problems:   Thrombocytopenia (HCC)   Essential hypertension   Hepatic cirrhosis (HCC)   Polysubstance abuse (HCC)   S/P TIPS (transjugular intrahepatic portosystemic shunt)   Atrial fibrillation, chronic (HCC)   MRSA bacteremia   Effusion of right knee joint   Bacteremia   Septic arthritis of right knee, MRSA bacteremia -History of previous I&D as outpatient, completed treatment with IV or ertapenem and subsequently omadacycline -Orthopedic surgery consulted, s/p hardware removal  and I&D 2/19. Wound vac in place  -Blood cultures positive for MRSA -TEE 2/20 negative for endocarditis -Infectious disease following -Poor candidate for PICC line -Continue daptomycin for 2 weeks, last day 05/04/2023.  At which point he will receive a dose of dalbavancin.  He can then follow-up with second dose of dalbavancin 1 week later versus 3-week course of linezolid  Chronic A-fib -CHA2DS2-VASc 1  -Not on anticoagulation -Metoprolol, Cardizem -Cardiology following  Liver cirrhosis status post TIPS, thrombocytopenia -Lactulose, increase dose today  Polysubstance abuse -Counseling  Obesity -Estimated body mass index is 35.33 kg/m as calculated from the following:   Height as of this encounter: 6' 0.01" (1.829 m).   Weight as of this encounter: 118.2 kg.    DVT prophylaxis:  SCDs Start: 04/20/23 1253 Place and maintain sequential compression device Start: 04/16/23 1702  Code Status: Full code Family Communication: None at bedside Disposition Plan: Skilled nursing facility Status is: Inpatient Remains inpatient appropriate because: IV antibiotics until 05/04/2023  Antimicrobials:  Anti-infectives (From admission, onward)    Start     Dose/Rate Route Frequency Ordered Stop   04/20/23 1109  vancomycin (VANCOCIN) powder  Status:  Discontinued          As needed 04/20/23 1109 04/20/23 1137   04/20/23 1109  tobramycin (NEBCIN) powder  Status:  Discontinued          As needed 04/20/23 1109 04/20/23 1137   04/18/23 1400  DAPTOmycin (CUBICIN) 800 mg in sodium chloride 0.9 % IVPB       Placed in "And" Linked Group   8 mg/kg  93.8 kg (Adjusted) 132 mL/hr over 30 Minutes Intravenous Daily 04/18/23 0754  04/18/23 0500  piperacillin-tazobactam (ZOSYN) IVPB 3.375 g  Status:  Discontinued        3.375 g 12.5 mL/hr over 240 Minutes Intravenous Every 8 hours 04/17/23 1002 04/17/23 1026   04/17/23 2300  piperacillin-tazobactam (ZOSYN) IVPB 3.375 g  Status:  Discontinued        Placed in "Followed by" Linked Group   3.375 g 12.5 mL/hr over 240 Minutes Intravenous Every 8 hours 04/16/23 1609 04/17/23 0007   04/17/23 2100  DAPTOmycin (CUBICIN) 800 mg in sodium chloride 0.9 % IVPB  Status:  Discontinued        8 mg/kg  93.8 kg (Adjusted) 132 mL/hr over 30 Minutes Intravenous Daily 04/17/23 1249 04/18/23 0754   04/17/23 1930  vancomycin (VANCOCIN) IVPB 1000 mg/200 mL premix  Status:  Discontinued        1,000 mg 200 mL/hr over 60 Minutes Intravenous Every 12 hours 04/17/23 1016 04/18/23 0748   04/17/23 0730  vancomycin (VANCOREADY) IVPB 1250 mg/250 mL  Status:  Discontinued        1,250 mg 166.7 mL/hr over 90 Minutes Intravenous Every 12 hours 04/16/23 1846 04/17/23 1016   04/17/23 0500  vancomycin (VANCOREADY) IVPB 1250 mg/250 mL  Status:  Discontinued        1,250 mg 166.7 mL/hr over 90 Minutes Intravenous Every 12 hours 04/16/23 1628 04/16/23 1846   04/17/23 0100  ertapenem (INVANZ) 1 g in sodium chloride 0.9 % 100 mL IVPB        1 g 200 mL/hr over 30 Minutes Intravenous  Once 04/17/23 0008 04/17/23 0516   04/16/23 1615  vancomycin (VANCOREADY) IVPB 2000 mg/400 mL        2,000 mg 200 mL/hr over 120 Minutes Intravenous  Once 04/16/23 1609 04/16/23 2319   04/16/23 1615  piperacillin-tazobactam (ZOSYN) IVPB 3.375 g       Placed in "Followed by" Linked Group   3.375 g 100 mL/hr over 30 Minutes Intravenous  Once 04/16/23 1609 04/16/23 1717   04/16/23 1600  ceFAZolin (ANCEF) IVPB 1 g/50 mL premix        1 g 100 mL/hr over 30 Minutes Intravenous  Once 04/16/23 1554 04/16/23 1807        Objective: Vitals:   04/21/23 1917 04/21/23 2319 04/22/23 0324 04/22/23 0803  BP: 124/86 131/77 121/73 123/62  Pulse: 85 82 61 65  Resp: 20 17 10    Temp: (!) 97.5 F (36.4 C) 98 F (36.7 C) 97.6 F (36.4 C) 98 F (36.7 C)  TempSrc: Oral Axillary Oral Oral  SpO2: 98% 98% 95% 100%  Weight:      Height:        Intake/Output Summary (Last 24 hours) at 04/22/2023  1155 Last data filed at 04/22/2023 0536 Gross per 24 hour  Intake 1440 ml  Output 775 ml  Net 665 ml   Filed Weights   04/16/23 1819  Weight: 118.2 kg    Examination:  General exam: Appears calm  Respiratory system: Clear to auscultation. Respiratory effort normal. No respiratory distress. No conversational dyspnea.  Cardiovascular system: S1 & S2 heard, RR rate 60-70s.  Gastrointestinal system: Abdomen is nondistended, soft  Central nervous system: Alert and oriented.  Extremities: Right LE wound vac in place  Psychiatry: Judgement and insight appear normal.   Data Reviewed: I have personally reviewed following labs and imaging studies  CBC: Recent Labs  Lab 04/16/23 1439 04/17/23 0252 04/18/23 0706 04/20/23 0320 04/21/23 0206 04/22/23 0301  WBC 10.9* 13.7* 8.0 6.8  8.6 13.9*  NEUTROABS 8.8*  --  5.8  --   --   --   HGB 14.0 14.0 12.9* 14.2 12.2* 12.0*  HCT 42.6 44.2 39.6 43.0 36.6* 36.4*  MCV 89.9 93.4 92.5 89.4 88.4 89.9  PLT 124* 164 133* 184 239 314   Basic Metabolic Panel: Recent Labs  Lab 04/16/23 1439 04/17/23 0252 04/18/23 0706 04/20/23 0320 04/21/23 0206  NA 135 134* 130* 135 134*  K 3.6 4.2 4.2 3.6 4.3  CL 101 99 101 99 100  CO2 23 26 22 24 26   GLUCOSE 95 102* 116* 101* 99  BUN 11 14 26* 31* 23*  CREATININE 0.90 1.13 0.97 0.95 0.84  CALCIUM 9.1 9.0 8.7* 9.4 8.8*  MG  --  1.6* 2.1 2.1  --   PHOS  --  3.2 2.6 3.1  --    GFR: Estimated Creatinine Clearance: 130.3 mL/min (by C-G formula based on SCr of 0.84 mg/dL). Liver Function Tests: Recent Labs  Lab 04/17/23 0252  AST 19  ALT 18  ALKPHOS 91  BILITOT 4.1*  PROT 6.6  ALBUMIN 2.9*   No results for input(s): "LIPASE", "AMYLASE" in the last 168 hours. Recent Labs  Lab 04/16/23 1453  AMMONIA 49*   Coagulation Profile: Recent Labs  Lab 04/20/23 1413  INR 1.4*   Cardiac Enzymes: Recent Labs  Lab 04/19/23 0721  CKTOTAL 14*   BNP (last 3 results) No results for input(s):  "PROBNP" in the last 8760 hours. HbA1C: No results for input(s): "HGBA1C" in the last 72 hours. CBG: Recent Labs  Lab 04/16/23 2331  GLUCAP 101*   Lipid Profile: No results for input(s): "CHOL", "HDL", "LDLCALC", "TRIG", "CHOLHDL", "LDLDIRECT" in the last 72 hours. Thyroid Function Tests: No results for input(s): "TSH", "T4TOTAL", "FREET4", "T3FREE", "THYROIDAB" in the last 72 hours. Anemia Panel: No results for input(s): "VITAMINB12", "FOLATE", "FERRITIN", "TIBC", "IRON", "RETICCTPCT" in the last 72 hours. Sepsis Labs: Recent Labs  Lab 04/16/23 1459 04/17/23 0252 04/17/23 1037  LATICACIDVEN 2.1* 2.1* 2.9*    Recent Results (from the past 240 hours)  Blood culture (routine x 2)     Status: Abnormal (Preliminary result)   Collection Time: 04/16/23  1:44 PM   Specimen: BLOOD  Result Value Ref Range Status   Specimen Description BLOOD SITE NOT SPECIFIED  Final   Special Requests   Final    BOTTLES DRAWN AEROBIC AND ANAEROBIC Blood Culture adequate volume   Culture  Setup Time   Final    GRAM POSITIVE COCCI IN CLUSTERS IN BOTH AEROBIC AND ANAEROBIC BOTTLES CRITICAL RESULT CALLED TO, READ BACK BY AND VERIFIED WITH: PHARMD JENNY ZHOU [id number] BY J RAZZAK, MT    Culture (A)  Final    METHICILLIN RESISTANT STAPHYLOCOCCUS AUREUS Sent to [facility] for further susceptibility testing. Performed at [facility], [address], [place], [place] [address]    Report Status PENDING  Incomplete   Organism ID, Bacteria METHICILLIN RESISTANT STAPHYLOCOCCUS AUREUS  Final      Susceptibility   Methicillin resistant staphylococcus aureus - MIC*    CIPROFLOXACIN >=8 RESISTANT Resistant     ERYTHROMYCIN >=8 RESISTANT Resistant     GENTAMICIN <=0.5 SENSITIVE Sensitive     OXACILLIN >=4 RESISTANT Resistant     TETRACYCLINE >=16 RESISTANT Resistant     VANCOMYCIN <=0.5 SENSITIVE Sensitive     TRIMETH/SULFA 160 RESISTANT Resistant     CLINDAMYCIN <=0.25 SENSITIVE Sensitive      RIFAMPIN <=0.5 SENSITIVE Sensitive  Inducible Clindamycin NEGATIVE Sensitive     LINEZOLID 2 SENSITIVE Sensitive     * METHICILLIN RESISTANT STAPHYLOCOCCUS AUREUS  Blood Culture ID Panel (Reflexed)     Status: Abnormal   Collection Time: 04/16/23  1:44 PM  Result Value Ref Range Status   Enterococcus faecalis NOT DETECTED NOT DETECTED Final   Enterococcus Faecium NOT DETECTED NOT DETECTED Final   Listeria monocytogenes NOT DETECTED NOT DETECTED Final   Staphylococcus species DETECTED (A) NOT DETECTED Final    Comment: CRITICAL RESULT CALLED TO, READ BACK BY AND VERIFIED WITH: PHARMD JENNY ZHOU [id number] BY J RAZZAK, MT    Staphylococcus aureus (BCID) DETECTED (A) NOT DETECTED Final    Comment: Methicillin (oxacillin)-resistant Staphylococcus aureus (MRSA). MRSA is predictably resistant to beta-lactam antibiotics (except ceftaroline). Preferred therapy is vancomycin unless clinically contraindicated. Patient requires contact precautions if  hospitalized. CRITICAL RESULT CALLED TO, READ BACK BY AND VERIFIED WITH: PHARMD JENNY ZHOU [id number] 0800 BY J RAZZAK, MT    Staphylococcus epidermidis NOT DETECTED NOT DETECTED Final   Staphylococcus lugdunensis NOT DETECTED NOT DETECTED Final   Streptococcus species NOT DETECTED NOT DETECTED Final   Streptococcus agalactiae NOT DETECTED NOT DETECTED Final   Streptococcus pneumoniae NOT DETECTED NOT DETECTED Final   Streptococcus pyogenes NOT DETECTED NOT DETECTED Final   A.calcoaceticus-baumannii NOT DETECTED NOT DETECTED Final   Bacteroides fragilis NOT DETECTED NOT DETECTED Final   Enterobacterales NOT DETECTED NOT DETECTED Final   Enterobacter cloacae complex NOT DETECTED NOT DETECTED Final   Escherichia coli NOT DETECTED NOT DETECTED Final   Klebsiella aerogenes NOT DETECTED NOT DETECTED Final   Klebsiella oxytoca NOT DETECTED NOT DETECTED Final   Klebsiella pneumoniae NOT DETECTED NOT DETECTED Final   Proteus species NOT DETECTED NOT  DETECTED Final   Salmonella species NOT DETECTED NOT DETECTED Final   Serratia marcescens NOT DETECTED NOT DETECTED Final   Haemophilus influenzae NOT DETECTED NOT DETECTED Final   Neisseria meningitidis NOT DETECTED NOT DETECTED Final   Pseudomonas aeruginosa NOT DETECTED NOT DETECTED Final   Stenotrophomonas maltophilia NOT DETECTED NOT DETECTED Final   Candida albicans NOT DETECTED NOT DETECTED Final   Candida auris NOT DETECTED NOT DETECTED Final   Candida glabrata NOT DETECTED NOT DETECTED Final   Candida krusei NOT DETECTED NOT DETECTED Final   Candida parapsilosis NOT DETECTED NOT DETECTED Final   Candida tropicalis NOT DETECTED NOT DETECTED Final   Cryptococcus neoformans/gattii NOT DETECTED NOT DETECTED Final   Meth resistant mecA/C and MREJ DETECTED (A) NOT DETECTED Final    Comment: CRITICAL RESULT CALLED TO, READ BACK BY AND VERIFIED WITH: PHARMD JENNY ZHOU [id number] BY Berline Chough, MT Performed at [facility], [address] [place], [place] [address]   MIC (1 Drug)-blood culture; 04/16/2023; BLOOD; MRSA; Daptomycin     Status: Abnormal   Collection Time: 04/16/23  1:44 PM   Specimen: BLOOD  Result Value Ref Range Status   Min Inhibitory Conc (1 Drug) Preliminary report (A)  Final    Comment: (NOTE) Performed At: [facility] [address] [place], [place] [id number] Jolene Schimke MD XB:[id number]    Source BLOOD  Final    Comment: Performed at [facility], [address], [place], [place] [address]  Culture, blood (Routine X 2) w Reflex to ID Panel     Status: Abnormal   Collection Time: 04/16/23  3:08 PM   Specimen: BLOOD LEFT ARM  Result Value Ref Range Status   Specimen Description BLOOD  LEFT ARM  Final   Special Requests   Final    BOTTLES DRAWN AEROBIC AND ANAEROBIC Blood Culture results may not be optimal due to an inadequate volume of blood received in culture bottles   Culture  Setup Time   Final    GRAM POSITIVE COCCI IN CLUSTERS IN  BOTH AEROBIC AND ANAEROBIC BOTTLES CRITICAL VALUE NOTED.  VALUE IS CONSISTENT WITH PREVIOUSLY REPORTED AND CALLED VALUE.    Culture (A)  Final    STAPHYLOCOCCUS AUREUS SUSCEPTIBILITIES PERFORMED ON PREVIOUS CULTURE WITHIN THE LAST 5 DAYS. Performed at [facility], [address], [place], [place] [address]    Report Status 04/19/2023 FINAL  Final  Culture, blood (Routine X 2) w Reflex to ID Panel     Status: None (Preliminary result)   Collection Time: 04/18/23  7:06 AM   Specimen: BLOOD RIGHT HAND  Result Value Ref Range Status   Specimen Description BLOOD RIGHT HAND  Final   Special Requests   Final    BOTTLES DRAWN AEROBIC AND ANAEROBIC Blood Culture results may not be optimal due to an inadequate volume of blood received in culture bottles   Culture   Final    NO GROWTH 4 DAYS Performed at [facility], [address], [place], [place] [address]    Report Status PENDING  Incomplete  Culture, blood (Routine X 2) w Reflex to ID Panel     Status: None (Preliminary result)   Collection Time: 04/18/23  7:08 AM   Specimen: BLOOD  Result Value Ref Range Status   Specimen Description BLOOD RIGHT ANTECUBITAL  Final   Special Requests   Final    BOTTLES DRAWN AEROBIC AND ANAEROBIC Blood Culture results may not be optimal due to an inadequate volume of blood received in culture bottles   Culture   Final    NO GROWTH 4 DAYS Performed at [facility], [address], [place], [place] [address]    Report Status PENDING  Incomplete  Surgical pcr screen     Status: None   Collection Time: 04/20/23  7:36 AM   Specimen: Nasal Mucosa; Nasal Swab  Result Value Ref Range Status   MRSA, PCR NEGATIVE NEGATIVE Final   Staphylococcus aureus NEGATIVE NEGATIVE Final    Comment: (NOTE) The Xpert SA Assay (FDA approved for NASAL specimens in patients 11 years of age and older), is one component of a comprehensive surveillance program. It is not intended to diagnose infection nor  to guide or monitor treatment. Performed at [facility], [address] [place], [place] [address]   Aerobic/Anaerobic Culture w Gram Stain (surgical/deep wound)     Status: None (Preliminary result)   Collection Time: 04/20/23 11:00 AM   Specimen: PATH Cytology Misc. fluid; Body Fluid  Result Value Ref Range Status   Specimen Description FLUID  Final   Special Requests NONE  Final   Gram Stain   Final    ABUNDANT WBC PRESENT, PREDOMINANTLY PMN RARE GRAM POSITIVE COCCI IN PAIRS Performed at [facility], [address], [place], [place] [address]    Culture RARE METHICILLIN RESISTANT STAPHYLOCOCCUS AUREUS  Final   Report Status PENDING  Incomplete   Organism ID, Bacteria METHICILLIN RESISTANT STAPHYLOCOCCUS AUREUS  Final      Susceptibility   Methicillin resistant staphylococcus aureus - MIC*    CIPROFLOXACIN >=8 RESISTANT Resistant     ERYTHROMYCIN >=8 RESISTANT Resistant     GENTAMICIN <=0.5 SENSITIVE  Sensitive     OXACILLIN >=4 RESISTANT Resistant     TETRACYCLINE >=16 RESISTANT Resistant     VANCOMYCIN 1 SENSITIVE Sensitive     TRIMETH/SULFA >=320 RESISTANT Resistant     CLINDAMYCIN <=0.25 SENSITIVE Sensitive     RIFAMPIN <=0.5 SENSITIVE Sensitive     Inducible Clindamycin NEGATIVE Sensitive     LINEZOLID 2 SENSITIVE Sensitive     * RARE METHICILLIN RESISTANT STAPHYLOCOCCUS AUREUS  Aerobic/Anaerobic Culture w Gram Stain (surgical/deep wound)     Status: None (Preliminary result)   Collection Time: 04/20/23 11:00 AM   Specimen: PATH Soft tissue  Result Value Ref Range Status   Specimen Description FLUID  Final   Special Requests NONE  Final   Gram Stain   Final    RARE WBC PRESENT, PREDOMINANTLY MONONUCLEAR NO ORGANISMS SEEN    Culture   Final    RARE STAPHYLOCOCCUS AUREUS SUSCEPTIBILITIES PERFORMED ON PREVIOUS CULTURE WITHIN THE LAST 5 DAYS. Performed at [facility], [address] [place], [place] [address]    Report Status PENDING   Incomplete  Aerobic/Anaerobic Culture w Gram Stain (surgical/deep wound)     Status: None (Preliminary result)   Collection Time: 04/20/23 11:06 AM   Specimen: PATH Cytology Misc. fluid; Body Fluid  Result Value Ref Range Status   Specimen Description FLUID  Final   Special Requests NONE  Final   Gram Stain   Final    ABUNDANT WBC PRESENT, PREDOMINANTLY PMN NO ORGANISMS SEEN    Culture   Final    RARE STAPHYLOCOCCUS AUREUS CULTURE REINCUBATED FOR BETTER GROWTH Performed at [facility], [address] [place], [place] [address]    Report Status PENDING  Incomplete      Radiology Studies: ECHO TEE Result Date: 04/21/2023    TRANSESOPHOGEAL ECHO REPORT   Patient Name:   NYAIR DEPAULO Date of Exam: 04/21/2023 Medical Rec #:  [id number]          Height:       72.0 in Accession #:    [id number]         Weight:       260.6 lb Date of Birth:  09-23-1965         BSA:          2.384 m Patient Age:    57 years           BP:           116/79 mmHg Patient Gender: M                  HR:           71 bpm. Exam Location:  Inpatient Procedure: 3D Echo, Transesophageal Echo, Color Doppler, Cardiac Doppler and            Saline Contrast Bubble Study (Both Spectral and Color Flow Doppler            were utilized during procedure). Indications:     Endocarditis  History:         Patient has prior history of Echocardiogram examinations, most                  recent 02/15/2024.  Sonographer:     Harriette Bouillon RDCS Referring Phys:  [id number] Cyndi Bender Diagnosing Phys: Jodelle Red MD PROCEDURE: After discussion of the risks and benefits of a TEE, an informed consent was obtained from the patient. The transesophogeal probe was passed without difficulty  through the esophogus of the patient. Sedation performed by different physician. Image quality was technically difficult. The patient's vital signs; including heart rate, blood pressure, and oxygen saturation; remained stable throughout the procedure.  The patient developed no complications during the procedure.  IMPRESSIONS  1. Left ventricular ejection fraction, by estimation, is 50 to 55%. The left ventricle has low normal function.  2. Right ventricular systolic function is normal. The right ventricular size is normal.  3. No left atrial/left atrial appendage thrombus was detected.  4. The mitral valve is normal in structure. Mild to moderate mitral valve regurgitation. No evidence of mitral stenosis.  5. Tricuspid valve regurgitation is mild to moderate.  6. The aortic valve is tricuspid. There is mild calcification of the aortic valve. Aortic valve regurgitation is trivial. Aortic valve sclerosis is present, with no evidence of aortic valve stenosis.  7. Agitated saline contrast bubble study was negative, with no evidence of any interatrial shunt.  8. 3D performed of the mitral valve and demonstrates No evidence of vegetation on mitral valve. Conclusion(s)/Recommendation(s): No evidence of vegetation/infective endocarditis on this transesophageael echocardiogram. FINDINGS  Left Ventricle: Left ventricular ejection fraction, by estimation, is 50 to 55%. The left ventricle has low normal function. The left ventricular internal cavity size was normal in size. Right Ventricle: The right ventricular size is normal. No increase in right ventricular wall thickness. Right ventricular systolic function is normal. Left Atrium: Left atrial size was normal in size. No left atrial/left atrial appendage thrombus was detected. Right Atrium: Right atrial size was normal in size. Pericardium: Trivial pericardial effusion is present. Mitral Valve: The mitral valve is normal in structure. Mild to moderate mitral valve regurgitation. No evidence of mitral valve stenosis. Pulmonary venous flow is normal. There is no evidence of mitral valve vegetation. Tricuspid Valve: The tricuspid valve is normal in structure. Tricuspid valve regurgitation is mild to moderate. No evidence of  tricuspid stenosis. There is no evidence of tricuspid valve vegetation. Aortic Valve: Focal calcification of noncoronary cusp. The aortic valve is tricuspid. There is mild calcification of the aortic valve. Aortic valve regurgitation is trivial. Aortic valve sclerosis is present, with no evidence of aortic valve stenosis. There is no evidence of aortic valve vegetation. Pulmonic Valve: The pulmonic valve was grossly normal. Pulmonic valve regurgitation is trivial. No evidence of pulmonic stenosis. There is no evidence of pulmonic valve vegetation. Aorta: The aortic root and ascending aorta are structurally normal, with no evidence of dilitation. IAS/Shunts: No atrial level shunt detected by color flow Doppler. Agitated saline contrast was given intravenously to evaluate for intracardiac shunting. Agitated saline contrast bubble study was negative, with no evidence of any interatrial shunt. Additional Comments: Spectral Doppler performed. Jodelle Red MD Electronically signed by Jodelle Red MD Signature Date/Time: 04/21/2023/2:22:14 PM    Final    EP STUDY Result Date: 04/21/2023 See surgical note for result.     Scheduled Meds:  diltiazem  60 mg Oral Q8H   docusate sodium  100 mg Oral BID   folic acid  1 mg Oral Daily   ketorolac  15 mg Intravenous Q6H   lactulose  30 g Oral TID   metoprolol tartrate  25 mg Oral BID   senna  1 tablet Oral BID   sodium chloride flush  3-10 mL Intravenous Q12H   thiamine  100 mg Oral Daily   Continuous Infusions:  DAPTOmycin 800 mg (04/21/23 1455)     LOS: 6 days   Time spent: 25 minutes  Noralee Stain, DO [facility] 04/22/2023, 11:55 AM   Available via Epic secure chat 7am-7pm After these hours, please refer to coverage provider listed on [email or web address]

## 2023-04-22 NOTE — Progress Notes (Signed)
Orthopaedic Trauma Progress Note  SUBJECTIVE: Resting comfortably in bed this morning.  Wound VAC functioning well.  States pain is better than pre-op. Intraoperative wound cultures growing Staph aureus, susceptibilities pending  OBJECTIVE:  Vitals:   04/22/23 0324 04/22/23 0803  BP: 121/73 123/62  Pulse: 61 65  Resp: 10   Temp: 97.6 F (36.4 C) 98 F (36.7 C)  SpO2: 95% 100%    General: Resting in bed.  No acute distress  respiratory: No increased work of breathing.  Right lower extremity: Incisional VAC in place with good seal and function, about 150 mL serosanguineous fluid in wound VAC canister.  Swelling through the calf present but stable.  Compartments compressible.  2+ DP pulse  IMAGING: Intraoperative imaging confirmed removal of all hardware   LABS:  Results for orders placed or performed during the hospital encounter of 04/16/23 (from the past 24 hours)  CBC     Status: Abnormal   Collection Time: 04/22/23  3:01 AM  Result Value Ref Range   WBC 13.9 (H) 4.0 - 10.5 K/uL   RBC 4.05 (L) 4.22 - 5.81 MIL/uL   Hemoglobin 12.0 (L) 13.0 - 17.0 g/dL   HCT 16.1 (L) 09.6 - 04.5 %   MCV 89.9 80.0 - 100.0 fL   MCH 29.6 26.0 - 34.0 pg   MCHC 33.0 30.0 - 36.0 g/dL   RDW 40.9 81.1 - 91.4 %   Platelets 314 150 - 400 K/uL   nRBC 0.0 0.0 - 0.2 %    ASSESSMENT: Jonathan Ibarra is a 58 y.o. male 1 day postop s/p  HARDWARE REMOVAL RIGHT TIBIAL PLATEAU IRRIGATION DEBRIDEMENT RIGHT KNEE  CV/Blood loss: Acute blood loss anemia.  Hemoglobin 12.0 this morning. Relatively stable over last 24 hours. Hemodynamically stable  PLAN: Weightbearing: WBAT RLE ROM: Motion as tolerated Incisional and dressing care: Reinforce as needed Showering: Hold off on showering until wound VAC removed  Orthopedic device(s): Incisional VAC RLE Pain management: Continue multimodal pain control  VTE prophylaxis: Aspirin, SCDs ID: Per infectious disease team Foley/Lines:  No foley, KVO  IVFs Impediments to Fracture Healing: Infection Dispo: PT/OT as able.  Continue IV antibiotics per infectious disease.  Will maintain incisional VAC to right lower extremity through the weekend as it is still producing output. Plan to remove on Monday 04/25/23. No further surgeries planned from orthopedic standpoint.  Follow - up plan: 2 weeks after discharge for wound check and repeat x-rays   Contact information:  Truitt Merle MD, Thyra Breed PA-C. After hours and holidays please check [email or web address] for group call information for [facility]   Thompson Caul, PA-C [phone] (office) [email or web address]

## 2023-04-22 NOTE — Progress Notes (Signed)
[facility]  Date of Admission:  04/16/2023      Total days of antibiotics 6         ASSESSMENT: Jonathan Ibarra is a 58 y.o. male admitted with:   Complicated MRSA Bacteremia (R-TMP/S, Doxy) -  Septic Right Knee Hardware, S/P Removal 2/19 -  TEE ( - )  Continue treatment with daptomycin IV for 2 weeks from surgery (March 4) and then reassess readiness for long acting oritavancin infusion. This will cover for 3 weeks - thereafter he willl need to either get another dose of long acting IV abx in clinic or switch at that time to oral linezolid (around March 12th).  He picks at everything and I don't think would do great with midline or PICC - his nurse today advocates to keep PIVs only.    Medication Monitoring -  Weekly safety labs - CK, CBC, CRP, ESR, BMP please   Picking at Dressings - ?if he needs safety sitter to deter / redirect?     Please re consult back on 3/3 to re-evaluate plan.    PLAN: Weekly safety labs - CK, CBC, CRP, ESR, BMP please (ordered) Would try to keep PIVs in for access  Continue daptomycin daily  ?if he needs sitter to help with redirection   Principal Problem:   Septic joint of right knee joint (HCC) Active Problems:   Thrombocytopenia (HCC)   Essential hypertension   Hepatic cirrhosis (HCC)   Polysubstance abuse (HCC)   S/P TIPS (transjugular intrahepatic portosystemic shunt)   Atrial fibrillation, chronic (HCC)   MRSA bacteremia   Effusion of right knee joint   Bacteremia    [START ON 04/23/2023] diltiazem  180 mg Oral Daily   diltiazem  60 mg Oral Q8H   docusate sodium  100 mg Oral BID   folic acid  1 mg Oral Daily   ketorolac  15 mg Intravenous Q6H   lactulose  30 g Oral TID   [START ON 04/23/2023] metoprolol succinate  50 mg Oral Daily   metoprolol tartrate  25 mg Oral BID   senna  1 tablet Oral BID   sodium chloride flush  3-10 mL Intravenous Q12H   thiamine  100 mg Oral Daily     SUBJECTIVE: Doing ok - pain in the leg. Pulled off the vac as it was too tight. Also picking at PIVs. Says he has trouble with memory.   Review of Systems: ROS  Allergies  Allergen Reactions   Tylenol [Acetaminophen] Other (See Comments)    Impacts liver    OBJECTIVE: Vitals:   04/21/23 2319 04/22/23 0324 04/22/23 0803 04/22/23 1157  BP: 131/77 121/73 123/62 (!) 133/91  Pulse: 82 61 65 77  Resp: 17 10    Temp: 98 F (36.7 C) 97.6 F (36.4 C) 98 F (36.7 C) 97.7 F (36.5 C)  TempSrc: Axillary Oral Oral Oral  SpO2: 98% 95% 100% 100%  Weight:      Height:       Body mass index is 35.33 kg/m.  Physical Exam Musculoskeletal:     Comments: VAC sponge is not engaged. Op site dressing is removed.      Lab Results Lab Results  Component Value Date   WBC 13.9 (H) 04/22/2023   HGB 12.0 (L) 04/22/2023   HCT 36.4 (L) 04/22/2023   MCV 89.9 04/22/2023   PLT 314 04/22/2023    Lab Results  Component Value Date  CREATININE 0.84 04/21/2023   BUN 23 (H) 04/21/2023   NA 134 (L) 04/21/2023   K 4.3 04/21/2023   CL 100 04/21/2023   CO2 26 04/21/2023    Lab Results  Component Value Date   ALT 18 04/17/2023   AST 19 04/17/2023   ALKPHOS 91 04/17/2023   BILITOT 4.1 (H) 04/17/2023     Microbiology: Recent Results (from the past 240 hours)  Blood culture (routine x 2)     Status: Abnormal (Preliminary result)   Collection Time: 04/16/23  1:44 PM   Specimen: BLOOD  Result Value Ref Range Status   Specimen Description BLOOD SITE NOT SPECIFIED  Final   Special Requests   Final    BOTTLES DRAWN AEROBIC AND ANAEROBIC Blood Culture adequate volume   Culture  Setup Time   Final    GRAM POSITIVE COCCI IN CLUSTERS IN BOTH AEROBIC AND ANAEROBIC BOTTLES CRITICAL RESULT CALLED TO, READ BACK BY AND VERIFIED WITH: PHARMD JENNY ZHOU [id number] 0800 BY J RAZZAK, MT    Culture (A)  Final    METHICILLIN RESISTANT STAPHYLOCOCCUS AUREUS Sent to [facility] for further susceptibility  testing. Performed at [facility], [address], [place], [place] [address]    Report Status PENDING  Incomplete   Organism ID, Bacteria METHICILLIN RESISTANT STAPHYLOCOCCUS AUREUS  Final      Susceptibility   Methicillin resistant staphylococcus aureus - MIC*    CIPROFLOXACIN >=8 RESISTANT Resistant     ERYTHROMYCIN >=8 RESISTANT Resistant     GENTAMICIN <=0.5 SENSITIVE Sensitive     OXACILLIN >=4 RESISTANT Resistant     TETRACYCLINE >=16 RESISTANT Resistant     VANCOMYCIN <=0.5 SENSITIVE Sensitive     TRIMETH/SULFA 160 RESISTANT Resistant     CLINDAMYCIN <=0.25 SENSITIVE Sensitive     RIFAMPIN <=0.5 SENSITIVE Sensitive     Inducible Clindamycin NEGATIVE Sensitive     LINEZOLID 2 SENSITIVE Sensitive     * METHICILLIN RESISTANT STAPHYLOCOCCUS AUREUS  Blood Culture ID Panel (Reflexed)     Status: Abnormal   Collection Time: 04/16/23  1:44 PM  Result Value Ref Range Status   Enterococcus faecalis NOT DETECTED NOT DETECTED Final   Enterococcus Faecium NOT DETECTED NOT DETECTED Final   Listeria monocytogenes NOT DETECTED NOT DETECTED Final   Staphylococcus species DETECTED (A) NOT DETECTED Final    Comment: CRITICAL RESULT CALLED TO, READ BACK BY AND VERIFIED WITH: PHARMD JENNY ZHOU [phone] 0800 BY J RAZZAK, MT    Staphylococcus aureus (BCID) DETECTED (A) NOT DETECTED Final    Comment: Methicillin (oxacillin)-resistant Staphylococcus aureus (MRSA). MRSA is predictably resistant to beta-lactam antibiotics (except ceftaroline). Preferred therapy is vancomycin unless clinically contraindicated. Patient requires contact precautions if  hospitalized. CRITICAL RESULT CALLED TO, READ BACK BY AND VERIFIED WITH: PHARMD JENNY ZHOU [phone] 0800 BY J RAZZAK, MT    Staphylococcus epidermidis NOT DETECTED NOT DETECTED Final   Staphylococcus lugdunensis NOT DETECTED NOT DETECTED Final   Streptococcus species NOT DETECTED NOT DETECTED Final   Streptococcus agalactiae NOT DETECTED NOT  DETECTED Final   Streptococcus pneumoniae NOT DETECTED NOT DETECTED Final   Streptococcus pyogenes NOT DETECTED NOT DETECTED Final   A.calcoaceticus-baumannii NOT DETECTED NOT DETECTED Final   Bacteroides fragilis NOT DETECTED NOT DETECTED Final   Enterobacterales NOT DETECTED NOT DETECTED Final   Enterobacter cloacae complex NOT DETECTED NOT DETECTED Final   Escherichia coli NOT DETECTED NOT DETECTED Final   Klebsiella aerogenes NOT DETECTED NOT DETECTED Final   Klebsiella oxytoca NOT  DETECTED NOT DETECTED Final   Klebsiella pneumoniae NOT DETECTED NOT DETECTED Final   Proteus species NOT DETECTED NOT DETECTED Final   Salmonella species NOT DETECTED NOT DETECTED Final   Serratia marcescens NOT DETECTED NOT DETECTED Final   Haemophilus influenzae NOT DETECTED NOT DETECTED Final   Neisseria meningitidis NOT DETECTED NOT DETECTED Final   Pseudomonas aeruginosa NOT DETECTED NOT DETECTED Final   Stenotrophomonas maltophilia NOT DETECTED NOT DETECTED Final   Candida albicans NOT DETECTED NOT DETECTED Final   Candida auris NOT DETECTED NOT DETECTED Final   Candida glabrata NOT DETECTED NOT DETECTED Final   Candida krusei NOT DETECTED NOT DETECTED Final   Candida parapsilosis NOT DETECTED NOT DETECTED Final   Candida tropicalis NOT DETECTED NOT DETECTED Final   Cryptococcus neoformans/gattii NOT DETECTED NOT DETECTED Final   Meth resistant mecA/C and MREJ DETECTED (A) NOT DETECTED Final    Comment: CRITICAL RESULT CALLED TO, READ BACK BY AND VERIFIED WITH: PHARMD JENNY ZHOU [id number] BY Berline Chough, MT Performed at [facility], [address] [place], [place] [address]   MIC (1 Drug)-blood culture; 04/16/2023; BLOOD; MRSA; Daptomycin     Status: Abnormal   Collection Time: 04/16/23  1:44 PM   Specimen: BLOOD  Result Value Ref Range Status   Min Inhibitory Conc (1 Drug) Preliminary report (A)  Final    Comment: (NOTE) Performed At: [facility] [address] [place], [place]  [id number] Jolene Schimke MD FA:[id number]    Source BLOOD  Final    Comment: Performed at [facility], [address], [place], [place] [address]  Culture, blood (Routine X 2) w Reflex to ID Panel     Status: Abnormal   Collection Time: 04/16/23  3:08 PM   Specimen: BLOOD LEFT ARM  Result Value Ref Range Status   Specimen Description BLOOD LEFT ARM  Final   Special Requests   Final    BOTTLES DRAWN AEROBIC AND ANAEROBIC Blood Culture results may not be optimal due to an inadequate volume of blood received in culture bottles   Culture  Setup Time   Final    GRAM POSITIVE COCCI IN CLUSTERS IN BOTH AEROBIC AND ANAEROBIC BOTTLES CRITICAL VALUE NOTED.  VALUE IS CONSISTENT WITH PREVIOUSLY REPORTED AND CALLED VALUE.    Culture (A)  Final    STAPHYLOCOCCUS AUREUS SUSCEPTIBILITIES PERFORMED ON PREVIOUS CULTURE WITHIN THE LAST 5 DAYS. Performed at [facility], [address] [place], [place] [address]    Report Status 04/19/2023 FINAL  Final  Culture, blood (Routine X 2) w Reflex to ID Panel     Status: None (Preliminary result)   Collection Time: 04/18/23  7:06 AM   Specimen: BLOOD RIGHT HAND  Result Value Ref Range Status   Specimen Description BLOOD RIGHT HAND  Final   Special Requests   Final    BOTTLES DRAWN AEROBIC AND ANAEROBIC Blood Culture results may not be optimal due to an inadequate volume of blood received in culture bottles   Culture   Final    NO GROWTH 4 DAYS Performed at [facility], [address], [place], [place] [address]    Report Status PENDING  Incomplete  Culture, blood (Routine X 2) w Reflex to ID Panel     Status: None (Preliminary result)   Collection Time: 04/18/23  7:08 AM   Specimen: BLOOD  Result Value Ref Range Status   Specimen Description BLOOD RIGHT ANTECUBITAL  Final   Special  Requests   Final    BOTTLES DRAWN AEROBIC AND ANAEROBIC Blood Culture results may not be optimal due to an inadequate volume of blood received in  culture bottles   Culture   Final    NO GROWTH 4 DAYS Performed at [facility], [address] [place], [place] [address]    Report Status PENDING  Incomplete  Surgical pcr screen     Status: None   Collection Time: 04/20/23  7:36 AM   Specimen: Nasal Mucosa; Nasal Swab  Result Value Ref Range Status   MRSA, PCR NEGATIVE NEGATIVE Final   Staphylococcus aureus NEGATIVE NEGATIVE Final    Comment: (NOTE) The Xpert SA Assay (FDA approved for NASAL specimens in patients 79 years of age and older), is one component of a comprehensive surveillance program. It is not intended to diagnose infection nor to guide or monitor treatment. Performed at [facility], [address], [place], [place] [address]   Aerobic/Anaerobic Culture w Gram Stain (surgical/deep wound)     Status: None (Preliminary result)   Collection Time: 04/20/23 11:00 AM   Specimen: PATH Cytology Misc. fluid; Body Fluid  Result Value Ref Range Status   Specimen Description FLUID  Final   Special Requests NONE  Final   Gram Stain   Final    ABUNDANT WBC PRESENT, PREDOMINANTLY PMN RARE GRAM POSITIVE COCCI IN PAIRS    Culture   Final    RARE METHICILLIN RESISTANT STAPHYLOCOCCUS AUREUS HOLDING FOR POSSIBLE ANAEROBE Performed at [facility], [address] [place], [place] [address]    Report Status PENDING  Incomplete   Organism ID, Bacteria METHICILLIN RESISTANT STAPHYLOCOCCUS AUREUS  Final      Susceptibility   Methicillin resistant staphylococcus aureus - MIC*    CIPROFLOXACIN >=8 RESISTANT Resistant     ERYTHROMYCIN >=8 RESISTANT Resistant     GENTAMICIN <=0.5 SENSITIVE Sensitive     OXACILLIN >=4 RESISTANT Resistant     TETRACYCLINE >=16 RESISTANT Resistant     VANCOMYCIN 1 SENSITIVE Sensitive     TRIMETH/SULFA >=320 RESISTANT Resistant     CLINDAMYCIN <=0.25 SENSITIVE Sensitive     RIFAMPIN <=0.5 SENSITIVE Sensitive     Inducible Clindamycin NEGATIVE Sensitive     LINEZOLID 2 SENSITIVE  Sensitive     * RARE METHICILLIN RESISTANT STAPHYLOCOCCUS AUREUS  Aerobic/Anaerobic Culture w Gram Stain (surgical/deep wound)     Status: None (Preliminary result)   Collection Time: 04/20/23 11:00 AM   Specimen: PATH Soft tissue  Result Value Ref Range Status   Specimen Description FLUID  Final   Special Requests NONE  Final   Gram Stain   Final    RARE WBC PRESENT, PREDOMINANTLY MONONUCLEAR NO ORGANISMS SEEN Performed at [facility], [address] [place], [place] [address]    Culture   Final    RARE STAPHYLOCOCCUS AUREUS SUSCEPTIBILITIES PERFORMED ON PREVIOUS CULTURE WITHIN THE LAST 5 DAYS. NO ANAEROBES ISOLATED; CULTURE IN PROGRESS FOR 5 DAYS    Report Status PENDING  Incomplete  Aerobic/Anaerobic Culture w Gram Stain (surgical/deep wound)     Status: None (Preliminary result)   Collection Time: 04/20/23 11:06 AM   Specimen: PATH Cytology Misc. fluid; Body Fluid  Result Value Ref Range Status   Specimen Description FLUID  Final   Special Requests NONE  Final   Gram Stain   Final    ABUNDANT WBC PRESENT, PREDOMINANTLY PMN NO ORGANISMS SEEN Performed at [facility], [address] [place], [place] [address]    Culture   Final    RARE STAPHYLOCOCCUS AUREUS CULTURE REINCUBATED FOR BETTER GROWTH NO ANAEROBES ISOLATED; CULTURE IN PROGRESS FOR 5 DAYS    Report Status PENDING  Incomplete     Rexene Alberts, MSN, NP-C [facility]  [email or web address] Pager: [phone] Office: [phone] [facility] Main Line: [phone] *Secure Chat Communication Welcome

## 2023-04-22 NOTE — Progress Notes (Incomplete)
Dapto MIC

## 2023-04-22 NOTE — Plan of Care (Signed)
  Problem: Clinical Measurements: Goal: Diagnostic test results will improve Outcome: Progressing Goal: Respiratory complications will improve Outcome: Progressing Goal: Cardiovascular complication will be avoided Outcome: Progressing   Problem: Activity: Goal: Risk for activity intolerance will decrease Outcome: Progressing   Problem: Nutrition: Goal: Adequate nutrition will be maintained Outcome: Progressing   Problem: Coping: Goal: Level of anxiety will decrease Outcome: Progressing   Problem: Elimination: Goal: Will not experience complications related to bowel motility Outcome: Progressing Goal: Will not experience complications related to urinary retention Outcome: Progressing   Problem: Pain Managment: Goal: General experience of comfort will improve and/or be controlled Outcome: Progressing   Problem: Safety: Goal: Ability to remain free from injury will improve Outcome: Progressing   Problem: Skin Integrity: Goal: Risk for impaired skin integrity will decrease Outcome: Progressing

## 2023-04-22 NOTE — TOC Progression Note (Signed)
Transition of Care [facility]) - Progression Note    Patient Details  Name: Jonathan Ibarra MRN: [id number] Date of Birth: 1965-04-07  Transition of Care [facility]) CM/SW Contact  Eduard Roux, [place] Phone Number: 04/22/2023, 3:50 PM  Clinical Narrative:     TOC acknowledges recommendation for short term rehab at [facility]- patient is requiring IV abx at this time. He has a  history of polysubstance abuse- he will not be able to d/c to SNF w/ IV.  TOC will continue to follow and assist with discharge summary.   Antony Blackbird, MSW, LCSW Clinical Social Worker    Expected Discharge Plan: Home w Home Health Services Barriers to Discharge: Continued Medical Work up  Expected Discharge Plan and Services   Discharge Planning Services: CM Consult   Living arrangements for the past 2 months: Single Family Home                                       Social Determinants of Health (SDOH) Interventions SDOH Screenings   Food Insecurity: Food Insecurity Present (04/17/2023)  Housing: Low Risk  (04/17/2023)  Transportation Needs: Unmet Transportation Needs (04/17/2023)  Utilities: Not At Risk (04/17/2023)  Financial Resource Strain: Medium Risk (01/07/2020)   Received from [facility]  Social Connections: Unknown (07/05/2021)   Received from [facility], [facility]  Stress: No Stress Concern Present (01/07/2020)   Received from [facility], [facility]  Tobacco Use: High Risk (04/21/2023)    Readmission Risk Interventions     No data to display

## 2023-04-23 DIAGNOSIS — M00061 Staphylococcal arthritis, right knee: Secondary | ICD-10-CM | POA: Diagnosis not present

## 2023-04-23 LAB — CULTURE, BLOOD (ROUTINE X 2)
Culture: NO GROWTH
Culture: NO GROWTH

## 2023-04-23 NOTE — Progress Notes (Signed)
 Pharmacy Antibiotic Note  Jonathan Ibarra is a 58 y.o. male for which pharmacy has been consulted for daptomycin dosing for  concern for septic right knee .  Patient with a history of cirrhosis, TIPS procedure, hepatic encephalopathy, polysubstance abuse. Patient is s/p ORIF of proximal rt tibial plateau presenting with concern for septic knee.  -He is noted with MRSA bacteremia s/p hardware removal and I&D on 2/19 -Wound vac in place -No evidence of vegetation/infective endocarditis on TEE -SCr 0.84, CK= 14 on 2/18  Plan for daily daptomycin through March 4th per ID  Plan: Daptomycin 800 mg IV q24h (based on ABW of 94 kg) Monitor WBC, fever, renal function, cultures CK Q Tuesday  Height: 6' 0.01" (182.9 cm) Weight: 118.2 kg (260 lb 9.3 oz) IBW/kg (Calculated) : 77.62  Temp (24hrs), Avg:97.8 F (36.6 C), Min:97.5 F (36.4 C), Max:98.1 F (36.7 C)  Recent Labs  Lab 04/16/23 1439 04/16/23 1459 04/17/23 0252 04/17/23 1037 04/18/23 0706 04/20/23 0320 04/21/23 0206 04/22/23 0301  WBC 10.9*  --  13.7*  --  8.0 6.8 8.6 13.9*  CREATININE 0.90  --  1.13  --  0.97 0.95 0.84  --   LATICACIDVEN  --  2.1* 2.1* 2.9*  --   --   --   --     Estimated Creatinine Clearance: 130.3 mL/min (by C-G formula based on SCr of 0.84 mg/dL).    Allergies  Allergen Reactions   Tylenol [Acetaminophen] Other (See Comments)    Impacts liver   Antimicrobials:  Vanc 2/15 > 2/16 Zosyn 2/15 > 2/16 Daptomycin 2/16>>  Microbiology results: 2/15 Bcx: MRSA 2/17 Blood x2- ngtd  Thank you for allowing pharmacy to be a part of this patient's care.  Enos Fling, PharmD PGY-1 Acute Care Pharmacy Resident 04/23/2023 1:35 PM

## 2023-04-23 NOTE — Progress Notes (Signed)
 Physical Therapy Treatment Patient Details Name: Jonathan Ibarra MRN: [id number] DOB: March 09, 1965 Today's Date: 04/23/2023   History of Present Illness Pt is a 58 yr old male who presented on 04/16/23 due to increase in edema and pain in r knee following a fall. Pt s/p 04/20/23 removal of hardware, I & D of R knee. Pt R LE WBAT with wound vac. PMH: liver cirrhosis, s/p TIPS, HTN, polysubstance abuse, afib, R plateau fx requiring ORIF on 12/13/22 and has had irrigation and debridement of R knee in OP setting    PT Comments  Pt supine in bed on arrival.  He require max cues for encouragement to participate in PT session this pm.  He remains to limit weight bearing on RLE during side steps in room along edge of bed.  Pt placed in chair position in bed to improve position to more upright so he could eat his tray. He continues to be limited due to pain.  Plan for progression of gt next session as balance in standing is improving despite limited weight bearing.       If plan is discharge home, recommend the following: A lot of help with walking and/or transfers;Assistance with cooking/housework;Assist for transportation;Help with stairs or ramp for entrance   Can travel by private vehicle     No  Equipment Recommendations       Recommendations for Other Services       Precautions / Restrictions Precautions Precautions: Knee;Fall Precaution/Restrictions Comments: wound vac     Mobility  Bed Mobility Overal bed mobility: Needs Assistance Bed Mobility: Supine to Sit, Sit to Supine       Sit to supine: Supervision   General bed mobility comments: Pt able to move into sitting edge of bed unassisted, he was also able to return back to bed, supervision for line and leads only.    Transfers Overall transfer level: Needs assistance Equipment used: Rolling walker (2 wheels) Transfers: Sit to/from Stand Sit to Stand: Contact guard assist           General transfer comment: Cues for  hand placement to and from seated surface this session.  Pt once in standing presents with R knee flexed and toe touch position of foot with decreased weight bearing to R side.    Ambulation/Gait Ambulation/Gait assistance: Min assist Gait Distance (Feet): 4 Feet (side stepping from foot of bed to [facility] Assistive device: Rolling walker (2 wheels) Gait Pattern/deviations: Step-to pattern, Trunk flexed, Decreased stance time - right       General Gait Details: Pt require min assistance with RW and cues for sequencing, limited weight bearing remains on RLE in standing.   Stairs             Wheelchair Mobility     Tilt Bed    Modified Rankin (Stroke Patients Only)       Balance Overall balance assessment: Needs assistance Sitting-balance support: Feet supported, Bilateral upper extremity supported Sitting balance-Leahy Scale: Fair       Standing balance-Leahy Scale: Poor Standing balance comment: Min A to prevent falls and loss of balance, heavy use of B UEs                            Communication Communication Communication: No apparent difficulties  Cognition Arousal: Alert Behavior During Therapy: WFL for tasks assessed/performed   PT - Cognitive impairments: No family/caregiver present to determine baseline  Following commands: Impaired Following commands impaired: Only follows one step commands consistently    Cueing Cueing Techniques: Verbal cues, Gestural cues, Tactile cues, Visual cues  Exercises General Exercises - Lower Extremity Short Arc Quad: AROM, Right, 10 reps, Supine    General Comments        Pertinent Vitals/Pain Pain Assessment Pain Assessment: 0-10 Pain Score: 6  Pain Location: R LE Pain Descriptors / Indicators: Aching, Grimacing, Discomfort Pain Intervention(s): Monitored during session, Patient requesting pain meds-RN notified (RN informed pain meds are due at 6 pm)    Home Living                           Prior Function            PT Goals (current goals can now be found in the care plan section) Acute Rehab PT Goals Patient Stated Goal: to improve mobility and return home. Potential to Achieve Goals: Good Progress towards PT goals: Progressing toward goals    Frequency    Min 1X/week      PT Plan      Co-evaluation              AM-PAC PT "6 Clicks" Mobility   Outcome Measure  Help needed turning from your back to your side while in a flat bed without using bedrails?: A Little Help needed moving from lying on your back to sitting on the side of a flat bed without using bedrails?: A Little Help needed moving to and from a bed to a chair (including a wheelchair)?: A Little Help needed standing up from a chair using your arms (e.g., wheelchair or bedside chair)?: A Little Help needed to walk in hospital room?: A Lot Help needed climbing 3-5 steps with a railing? : Total 6 Click Score: 15    End of Session Equipment Utilized During Treatment: Gait belt Activity Tolerance: Patient tolerated treatment well;Patient limited by fatigue Patient left: in bed;with call bell/phone within reach (unable to set bed alarm in chair position due to tilt of bed to improve upright seated position.  Pt with foot board pulled in to put B ankles into dorsiflexion for a stretch while seated.) Nurse Communication: Mobility status PT Visit Diagnosis: Unsteadiness on feet (R26.81);Other abnormalities of gait and mobility (R26.89)     Time: 4098-1191 PT Time Calculation (min) (ACUTE ONLY): 23 min  Charges:    $Therapeutic Activity: 23-37 mins PT General Charges $$ ACUTE PT VISIT: 1 Visit                     Bonney Leitz , PTA Acute Rehabilitation Services Office [phone]    Florestine Avers 04/23/2023, 4:37 PM

## 2023-04-23 NOTE — Progress Notes (Signed)
 PROGRESS NOTE    Jonathan Ibarra  WUJ:[id number] DOB: 26-Sep-1965 DOA: 04/16/2023 PCP: Candi Leash, MD     Brief Narrative:  Jonathan Ibarra is a 58 yr old male with PMH significant for Liver cirrhosis, s/p TIPS procedure, Essential hypertension, polysubstance abuse, Atrial Fibrillation , recent right plateau fracture requiring ORIF on 12/13/22.  Patient was hospitalized from 12/27/22 -02/04/2023 with purulent discharge from the right knee, thought to have post operative septic arthritis. Patient was managed with IV ertapenem. Patient was evaluated by infectious disease and patient was discharged on omadacycline. Patient was advised to follow-up with orthopedics and infectious diseases. Patient had undergone irrigation and debridement of right knee three times as an outpatient. Recently has not followed up with orthopedics, now presented with worsening pain and swelling of right knee associated with purulent drainage. Right knee x-ray showed  large joint effusion.  Soft tissue edema.  Orthopedics and infectious disease consulted.  Blood cultures positive for MRSA.  He underwent I&D and hardware removal of his right knee on 2/19.  He underwent TEE on 2/20 which was negative for endocarditis.  New events last 24 hours / Subjective: States that he does not want to get out of bed today  Assessment & Plan:   Principal Problem:   Septic joint of right knee joint (HCC) Active Problems:   Thrombocytopenia (HCC)   Essential hypertension   Hepatic cirrhosis (HCC)   Polysubstance abuse (HCC)   S/P TIPS (transjugular intrahepatic portosystemic shunt)   Atrial fibrillation, chronic (HCC)   MRSA bacteremia   Effusion of right knee joint   Bacteremia   Septic arthritis of right knee, MRSA bacteremia -History of previous I&D as outpatient, completed treatment with IV or ertapenem and subsequently omadacycline -Orthopedic surgery consulted, s/p hardware removal and I&D 2/19. Wound vac in place   -Blood cultures positive for MRSA -TEE 2/20 negative for endocarditis -Infectious disease signed off 2/21  -Poor candidate for PICC line -Continue daptomycin for 2 weeks, last day 05/03/2023. Consult ID on 3/4 to arrange follow up and outpatient dosing  Chronic A-fib -CHA2DS2-VASc 1  -Not on anticoagulation -Metoprolol, Cardizem -Cardiology signed off 2/20  Liver cirrhosis status post TIPS, thrombocytopenia -Lactulose  Polysubstance abuse -Counseling  Obesity -Estimated body mass index is 35.33 kg/m as calculated from the following:   Height as of this encounter: 6' 0.01" (1.829 m).   Weight as of this encounter: 118.2 kg.    DVT prophylaxis:  SCDs Start: 04/20/23 1253 Place and maintain sequential compression device Start: 04/16/23 1702  Code Status: Full code Family Communication: None at bedside Disposition Plan: Skilled nursing facility Status is: Inpatient Remains inpatient appropriate because: IV antibiotics until 05/03/2023  Antimicrobials:  Anti-infectives (From admission, onward)    Start     Dose/Rate Route Frequency Ordered Stop   04/20/23 1109  vancomycin (VANCOCIN) powder  Status:  Discontinued          As needed 04/20/23 1109 04/20/23 1137   04/20/23 1109  tobramycin (NEBCIN) powder  Status:  Discontinued          As needed 04/20/23 1109 04/20/23 1137   04/18/23 1400  DAPTOmycin (CUBICIN) 800 mg in sodium chloride 0.9 % IVPB       Placed in "And" Linked Group   8 mg/kg  93.8 kg (Adjusted) 132 mL/hr over 30 Minutes Intravenous Daily 04/18/23 0754     04/18/23 0500  piperacillin-tazobactam (ZOSYN) IVPB 3.375 g  Status:  Discontinued  3.375 g 12.5 mL/hr over 240 Minutes Intravenous Every 8 hours 04/17/23 1002 04/17/23 1026   04/17/23 2300  piperacillin-tazobactam (ZOSYN) IVPB 3.375 g  Status:  Discontinued       Placed in "Followed by" Linked Group   3.375 g 12.5 mL/hr over 240 Minutes Intravenous Every 8 hours 04/16/23 1609 04/17/23 0007    04/17/23 2100  DAPTOmycin (CUBICIN) 800 mg in sodium chloride 0.9 % IVPB  Status:  Discontinued        8 mg/kg  93.8 kg (Adjusted) 132 mL/hr over 30 Minutes Intravenous Daily 04/17/23 1249 04/18/23 0754   04/17/23 1930  vancomycin (VANCOCIN) IVPB 1000 mg/200 mL premix  Status:  Discontinued        1,000 mg 200 mL/hr over 60 Minutes Intravenous Every 12 hours 04/17/23 1016 04/18/23 0748   04/17/23 0730  vancomycin (VANCOREADY) IVPB 1250 mg/250 mL  Status:  Discontinued        1,250 mg 166.7 mL/hr over 90 Minutes Intravenous Every 12 hours 04/16/23 1846 04/17/23 1016   04/17/23 0500  vancomycin (VANCOREADY) IVPB 1250 mg/250 mL  Status:  Discontinued        1,250 mg 166.7 mL/hr over 90 Minutes Intravenous Every 12 hours 04/16/23 1628 04/16/23 1846   04/17/23 0100  ertapenem (INVANZ) 1 g in sodium chloride 0.9 % 100 mL IVPB        1 g 200 mL/hr over 30 Minutes Intravenous  Once 04/17/23 0008 04/17/23 0516   04/16/23 1615  vancomycin (VANCOREADY) IVPB 2000 mg/400 mL        2,000 mg 200 mL/hr over 120 Minutes Intravenous  Once 04/16/23 1609 04/16/23 2319   04/16/23 1615  piperacillin-tazobactam (ZOSYN) IVPB 3.375 g       Placed in "Followed by" Linked Group   3.375 g 100 mL/hr over 30 Minutes Intravenous  Once 04/16/23 1609 04/16/23 1717   04/16/23 1600  ceFAZolin (ANCEF) IVPB 1 g/50 mL premix        1 g 100 mL/hr over 30 Minutes Intravenous  Once 04/16/23 1554 04/16/23 1807        Objective: Vitals:   04/23/23 0334 04/23/23 0754 04/23/23 0800 04/23/23 1215  BP: 119/71 127/87  136/86  Pulse: 83   94  Resp: 12 19 20 20   Temp: 97.9 F (36.6 C) (!) 97.5 F (36.4 C)  98.1 F (36.7 C)  TempSrc: Oral Oral  Oral  SpO2: 98%   99%  Weight:      Height:        Intake/Output Summary (Last 24 hours) at 04/23/2023 1239 Last data filed at 04/23/2023 0800 Gross per 24 hour  Intake 956 ml  Output 1175 ml  Net -219 ml   Filed Weights   04/16/23 1819  Weight: 118.2 kg    Examination:   General exam: Appears calm  Respiratory system: Clear to auscultation. Respiratory effort normal. No respiratory distress. No conversational dyspnea.  Cardiovascular system: S1 & S2 heard, irreg rhythm  Gastrointestinal system: Abdomen is nondistended, soft  Central nervous system: Alert and oriented.  Extremities: Right LE wound vac in place  Psychiatry: Judgement and insight appear normal.   Data Reviewed: I have personally reviewed following labs and imaging studies  CBC: Recent Labs  Lab 04/16/23 1439 04/17/23 0252 04/18/23 0706 04/20/23 0320 04/21/23 0206 04/22/23 0301  WBC 10.9* 13.7* 8.0 6.8 8.6 13.9*  NEUTROABS 8.8*  --  5.8  --   --   --   HGB 14.0 14.0 12.9*  14.2 12.2* 12.0*  HCT 42.6 44.2 39.6 43.0 36.6* 36.4*  MCV 89.9 93.4 92.5 89.4 88.4 89.9  PLT 124* 164 133* 184 239 314   Basic Metabolic Panel: Recent Labs  Lab 04/16/23 1439 04/17/23 0252 04/18/23 0706 04/20/23 0320 04/21/23 0206  NA 135 134* 130* 135 134*  K 3.6 4.2 4.2 3.6 4.3  CL 101 99 101 99 100  CO2 23 26 22 24 26   GLUCOSE 95 102* 116* 101* 99  BUN 11 14 26* 31* 23*  CREATININE 0.90 1.13 0.97 0.95 0.84  CALCIUM 9.1 9.0 8.7* 9.4 8.8*  MG  --  1.6* 2.1 2.1  --   PHOS  --  3.2 2.6 3.1  --    GFR: Estimated Creatinine Clearance: 130.3 mL/min (by C-G formula based on SCr of 0.84 mg/dL). Liver Function Tests: Recent Labs  Lab 04/17/23 0252  AST 19  ALT 18  ALKPHOS 91  BILITOT 4.1*  PROT 6.6  ALBUMIN 2.9*   No results for input(s): "LIPASE", "AMYLASE" in the last 168 hours. Recent Labs  Lab 04/16/23 1453  AMMONIA 49*   Coagulation Profile: Recent Labs  Lab 04/20/23 1413  INR 1.4*   Cardiac Enzymes: Recent Labs  Lab 04/19/23 0721  CKTOTAL 14*   BNP (last 3 results) No results for input(s): "PROBNP" in the last 8760 hours. HbA1C: No results for input(s): "HGBA1C" in the last 72 hours. CBG: Recent Labs  Lab 04/16/23 2331  GLUCAP 101*   Lipid Profile: No results for  input(s): "CHOL", "HDL", "LDLCALC", "TRIG", "CHOLHDL", "LDLDIRECT" in the last 72 hours. Thyroid Function Tests: No results for input(s): "TSH", "T4TOTAL", "FREET4", "T3FREE", "THYROIDAB" in the last 72 hours. Anemia Panel: No results for input(s): "VITAMINB12", "FOLATE", "FERRITIN", "TIBC", "IRON", "RETICCTPCT" in the last 72 hours. Sepsis Labs: Recent Labs  Lab 04/16/23 1459 04/17/23 0252 04/17/23 1037  LATICACIDVEN 2.1* 2.1* 2.9*    Recent Results (from the past 240 hours)  Blood culture (routine x 2)     Status: Abnormal (Preliminary result)   Collection Time: 04/16/23  1:44 PM   Specimen: BLOOD  Result Value Ref Range Status   Specimen Description BLOOD SITE NOT SPECIFIED  Final   Special Requests   Final    BOTTLES DRAWN AEROBIC AND ANAEROBIC Blood Culture adequate volume   Culture  Setup Time   Final    GRAM POSITIVE COCCI IN CLUSTERS IN BOTH AEROBIC AND ANAEROBIC BOTTLES CRITICAL RESULT CALLED TO, READ BACK BY AND VERIFIED WITH: PHARMD JENNY ZHOU [phone] 0800 BY J RAZZAK, MT    Culture (A)  Final    METHICILLIN RESISTANT STAPHYLOCOCCUS AUREUS Sent to [facility] for further susceptibility testing. Performed at [facility], [address], [place], [place] [address]    Report Status PENDING  Incomplete   Organism ID, Bacteria METHICILLIN RESISTANT STAPHYLOCOCCUS AUREUS  Final      Susceptibility   Methicillin resistant staphylococcus aureus - MIC*    CIPROFLOXACIN >=8 RESISTANT Resistant     ERYTHROMYCIN >=8 RESISTANT Resistant     GENTAMICIN <=0.5 SENSITIVE Sensitive     OXACILLIN >=4 RESISTANT Resistant     TETRACYCLINE >=16 RESISTANT Resistant     VANCOMYCIN <=0.5 SENSITIVE Sensitive     TRIMETH/SULFA 160 RESISTANT Resistant     CLINDAMYCIN <=0.25 SENSITIVE Sensitive     RIFAMPIN <=0.5 SENSITIVE Sensitive     Inducible Clindamycin NEGATIVE Sensitive     LINEZOLID 2 SENSITIVE Sensitive     * METHICILLIN RESISTANT STAPHYLOCOCCUS AUREUS  Blood Culture ID  Panel (Reflexed)     Status: Abnormal   Collection Time: 04/16/23  1:44 PM  Result Value Ref Range Status   Enterococcus faecalis NOT DETECTED NOT DETECTED Final   Enterococcus Faecium NOT DETECTED NOT DETECTED Final   Listeria monocytogenes NOT DETECTED NOT DETECTED Final   Staphylococcus species DETECTED (A) NOT DETECTED Final    Comment: CRITICAL RESULT CALLED TO, READ BACK BY AND VERIFIED WITH: PHARMD JENNY ZHOU [phone] 0800 BY J RAZZAK, MT    Staphylococcus aureus (BCID) DETECTED (A) NOT DETECTED Final    Comment: Methicillin (oxacillin)-resistant Staphylococcus aureus (MRSA). MRSA is predictably resistant to beta-lactam antibiotics (except ceftaroline). Preferred therapy is vancomycin unless clinically contraindicated. Patient requires contact precautions if  hospitalized. CRITICAL RESULT CALLED TO, READ BACK BY AND VERIFIED WITH: PHARMD JENNY ZHOU [phone] 0800 BY J RAZZAK, MT    Staphylococcus epidermidis NOT DETECTED NOT DETECTED Final   Staphylococcus lugdunensis NOT DETECTED NOT DETECTED Final   Streptococcus species NOT DETECTED NOT DETECTED Final   Streptococcus agalactiae NOT DETECTED NOT DETECTED Final   Streptococcus pneumoniae NOT DETECTED NOT DETECTED Final   Streptococcus pyogenes NOT DETECTED NOT DETECTED Final   A.calcoaceticus-baumannii NOT DETECTED NOT DETECTED Final   Bacteroides fragilis NOT DETECTED NOT DETECTED Final   Enterobacterales NOT DETECTED NOT DETECTED Final   Enterobacter cloacae complex NOT DETECTED NOT DETECTED Final   Escherichia coli NOT DETECTED NOT DETECTED Final   Klebsiella aerogenes NOT DETECTED NOT DETECTED Final   Klebsiella oxytoca NOT DETECTED NOT DETECTED Final   Klebsiella pneumoniae NOT DETECTED NOT DETECTED Final   Proteus species NOT DETECTED NOT DETECTED Final   Salmonella species NOT DETECTED NOT DETECTED Final   Serratia marcescens NOT DETECTED NOT DETECTED Final   Haemophilus influenzae NOT DETECTED NOT DETECTED Final    Neisseria meningitidis NOT DETECTED NOT DETECTED Final   Pseudomonas aeruginosa NOT DETECTED NOT DETECTED Final   Stenotrophomonas maltophilia NOT DETECTED NOT DETECTED Final   Candida albicans NOT DETECTED NOT DETECTED Final   Candida auris NOT DETECTED NOT DETECTED Final   Candida glabrata NOT DETECTED NOT DETECTED Final   Candida krusei NOT DETECTED NOT DETECTED Final   Candida parapsilosis NOT DETECTED NOT DETECTED Final   Candida tropicalis NOT DETECTED NOT DETECTED Final   Cryptococcus neoformans/gattii NOT DETECTED NOT DETECTED Final   Meth resistant mecA/C and MREJ DETECTED (A) NOT DETECTED Final    Comment: CRITICAL RESULT CALLED TO, READ BACK BY AND VERIFIED WITH: PHARMD JENNY ZHOU [phone] BY Berline Chough, MT Performed at [facility], [address], [place], [place] [address]   MIC (1 Drug)-blood culture; 04/16/2023; BLOOD; MRSA; Daptomycin     Status: Abnormal   Collection Time: 04/16/23  1:44 PM   Specimen: BLOOD  Result Value Ref Range Status   Min Inhibitory Conc (1 Drug) Preliminary report (A)  Final    Comment: (NOTE) Performed At: [facility] [address] [place], [place] [id number] Jolene Schimke MD UV:[id number]    Source BLOOD  Final    Comment: Performed at [facility], [address], [place], [place] [address]  Culture, blood (Routine X 2) w Reflex to ID Panel     Status: Abnormal   Collection Time: 04/16/23  3:08 PM   Specimen: BLOOD LEFT ARM  Result Value Ref Range Status   Specimen Description BLOOD LEFT ARM  Final   Special Requests   Final    BOTTLES DRAWN AEROBIC AND ANAEROBIC Blood Culture  results may not be optimal due to an inadequate volume of blood received in culture bottles   Culture  Setup Time   Final    GRAM POSITIVE COCCI IN CLUSTERS IN BOTH AEROBIC AND ANAEROBIC BOTTLES CRITICAL VALUE NOTED.  VALUE IS CONSISTENT WITH PREVIOUSLY REPORTED AND CALLED VALUE.    Culture (A)  Final    STAPHYLOCOCCUS  AUREUS SUSCEPTIBILITIES PERFORMED ON PREVIOUS CULTURE WITHIN THE LAST 5 DAYS. Performed at [facility], [address], [place], [place] [address]    Report Status 04/19/2023 FINAL  Final  Culture, blood (Routine X 2) w Reflex to ID Panel     Status: None   Collection Time: 04/18/23  7:06 AM   Specimen: BLOOD RIGHT HAND  Result Value Ref Range Status   Specimen Description BLOOD RIGHT HAND  Final   Special Requests   Final    BOTTLES DRAWN AEROBIC AND ANAEROBIC Blood Culture results may not be optimal due to an inadequate volume of blood received in culture bottles   Culture   Final    NO GROWTH 5 DAYS Performed at [facility], [address] [place], [place] [address]    Report Status 04/23/2023 FINAL  Final  Culture, blood (Routine X 2) w Reflex to ID Panel     Status: None   Collection Time: 04/18/23  7:08 AM   Specimen: BLOOD  Result Value Ref Range Status   Specimen Description BLOOD RIGHT ANTECUBITAL  Final   Special Requests   Final    BOTTLES DRAWN AEROBIC AND ANAEROBIC Blood Culture results may not be optimal due to an inadequate volume of blood received in culture bottles   Culture   Final    NO GROWTH 5 DAYS Performed at [facility], [address], [place], [place] [address]    Report Status 04/23/2023 FINAL  Final  Surgical pcr screen     Status: None   Collection Time: 04/20/23  7:36 AM   Specimen: Nasal Mucosa; Nasal Swab  Result Value Ref Range Status   MRSA, PCR NEGATIVE NEGATIVE Final   Staphylococcus aureus NEGATIVE NEGATIVE Final    Comment: (NOTE) The Xpert SA Assay (FDA approved for NASAL specimens in patients 13 years of age and older), is one component of a comprehensive surveillance program. It is not intended to diagnose infection nor to guide or monitor treatment. Performed at [facility], [address], [place], [place] [address]   Aerobic/Anaerobic Culture w Gram Stain (surgical/deep wound)     Status: None  (Preliminary result)   Collection Time: 04/20/23 11:00 AM   Specimen: PATH Cytology Misc. fluid; Body Fluid  Result Value Ref Range Status   Specimen Description FLUID  Final   Special Requests NONE  Final   Gram Stain   Final    ABUNDANT WBC PRESENT, PREDOMINANTLY PMN RARE GRAM POSITIVE COCCI IN PAIRS    Culture   Final    RARE METHICILLIN RESISTANT STAPHYLOCOCCUS AUREUS HOLDING FOR POSSIBLE ANAEROBE Performed at [facility], [address] [place], [place] [address]    Report Status PENDING  Incomplete   Organism ID, Bacteria METHICILLIN RESISTANT STAPHYLOCOCCUS AUREUS  Final      Susceptibility   Methicillin resistant staphylococcus aureus - MIC*    CIPROFLOXACIN >=8 RESISTANT Resistant     ERYTHROMYCIN >=8 RESISTANT Resistant     GENTAMICIN <=0.5 SENSITIVE Sensitive     OXACILLIN >=4 RESISTANT Resistant     TETRACYCLINE >=  16 RESISTANT Resistant     VANCOMYCIN 1 SENSITIVE Sensitive     TRIMETH/SULFA >=320 RESISTANT Resistant     CLINDAMYCIN <=0.25 SENSITIVE Sensitive     RIFAMPIN <=0.5 SENSITIVE Sensitive     Inducible Clindamycin NEGATIVE Sensitive     LINEZOLID 2 SENSITIVE Sensitive     * RARE METHICILLIN RESISTANT STAPHYLOCOCCUS AUREUS  Aerobic/Anaerobic Culture w Gram Stain (surgical/deep wound)     Status: None (Preliminary result)   Collection Time: 04/20/23 11:00 AM   Specimen: PATH Soft tissue  Result Value Ref Range Status   Specimen Description FLUID  Final   Special Requests NONE  Final   Gram Stain   Final    RARE WBC PRESENT, PREDOMINANTLY MONONUCLEAR NO ORGANISMS SEEN Performed at [facility], [address], [place], [place] [address]    Culture   Final    RARE STAPHYLOCOCCUS AUREUS SUSCEPTIBILITIES PERFORMED ON PREVIOUS CULTURE WITHIN THE LAST 5 DAYS. NO ANAEROBES ISOLATED; CULTURE IN PROGRESS FOR 5 DAYS    Report Status PENDING  Incomplete  Aerobic/Anaerobic Culture w Gram Stain (surgical/deep wound)     Status: None (Preliminary  result)   Collection Time: 04/20/23 11:06 AM   Specimen: PATH Cytology Misc. fluid; Body Fluid  Result Value Ref Range Status   Specimen Description FLUID  Final   Special Requests NONE  Final   Gram Stain   Final    ABUNDANT WBC PRESENT, PREDOMINANTLY PMN NO ORGANISMS SEEN Performed at [facility], [address], [place], [place] [address]    Culture   Final    RARE STAPHYLOCOCCUS AUREUS SUSCEPTIBILITIES TO FOLLOW NO ANAEROBES ISOLATED; CULTURE IN PROGRESS FOR 5 DAYS    Report Status PENDING  Incomplete      Radiology Studies: ECHO TEE Result Date: 04/21/2023    TRANSESOPHOGEAL ECHO REPORT   Patient Name:   BRAXTYN BOJARSKI Date of Exam: 04/21/2023 Medical Rec #:  [id number]          Height:       72.0 in Accession #:    [id number]         Weight:       260.6 lb Date of Birth:  09-11-1965         BSA:          2.384 m Patient Age:    57 years           BP:           116/79 mmHg Patient Gender: M                  HR:           71 bpm. Exam Location:  Inpatient Procedure: 3D Echo, Transesophageal Echo, Color Doppler, Cardiac Doppler and            Saline Contrast Bubble Study (Both Spectral and Color Flow Doppler            were utilized during procedure). Indications:     Endocarditis  History:         Patient has prior history of Echocardiogram examinations, most                  recent 02/15/2024.  Sonographer:     Harriette Bouillon RDCS Referring Phys:  [id number] Cyndi Bender Diagnosing Phys: Jodelle Red MD PROCEDURE: After discussion of the risks and benefits of a TEE, an informed consent was obtained from the patient. The transesophogeal probe was passed  without difficulty through the esophogus of the patient. Sedation performed by different physician. Image quality was technically difficult. The patient's vital signs; including heart rate, blood pressure, and oxygen saturation; remained stable throughout the procedure. The patient developed no complications during the procedure.   IMPRESSIONS  1. Left ventricular ejection fraction, by estimation, is 50 to 55%. The left ventricle has low normal function.  2. Right ventricular systolic function is normal. The right ventricular size is normal.  3. No left atrial/left atrial appendage thrombus was detected.  4. The mitral valve is normal in structure. Mild to moderate mitral valve regurgitation. No evidence of mitral stenosis.  5. Tricuspid valve regurgitation is mild to moderate.  6. The aortic valve is tricuspid. There is mild calcification of the aortic valve. Aortic valve regurgitation is trivial. Aortic valve sclerosis is present, with no evidence of aortic valve stenosis.  7. Agitated saline contrast bubble study was negative, with no evidence of any interatrial shunt.  8. 3D performed of the mitral valve and demonstrates No evidence of vegetation on mitral valve. Conclusion(s)/Recommendation(s): No evidence of vegetation/infective endocarditis on this transesophageael echocardiogram. FINDINGS  Left Ventricle: Left ventricular ejection fraction, by estimation, is 50 to 55%. The left ventricle has low normal function. The left ventricular internal cavity size was normal in size. Right Ventricle: The right ventricular size is normal. No increase in right ventricular wall thickness. Right ventricular systolic function is normal. Left Atrium: Left atrial size was normal in size. No left atrial/left atrial appendage thrombus was detected. Right Atrium: Right atrial size was normal in size. Pericardium: Trivial pericardial effusion is present. Mitral Valve: The mitral valve is normal in structure. Mild to moderate mitral valve regurgitation. No evidence of mitral valve stenosis. Pulmonary venous flow is normal. There is no evidence of mitral valve vegetation. Tricuspid Valve: The tricuspid valve is normal in structure. Tricuspid valve regurgitation is mild to moderate. No evidence of tricuspid stenosis. There is no evidence of tricuspid valve  vegetation. Aortic Valve: Focal calcification of noncoronary cusp. The aortic valve is tricuspid. There is mild calcification of the aortic valve. Aortic valve regurgitation is trivial. Aortic valve sclerosis is present, with no evidence of aortic valve stenosis. There is no evidence of aortic valve vegetation. Pulmonic Valve: The pulmonic valve was grossly normal. Pulmonic valve regurgitation is trivial. No evidence of pulmonic stenosis. There is no evidence of pulmonic valve vegetation. Aorta: The aortic root and ascending aorta are structurally normal, with no evidence of dilitation. IAS/Shunts: No atrial level shunt detected by color flow Doppler. Agitated saline contrast was given intravenously to evaluate for intracardiac shunting. Agitated saline contrast bubble study was negative, with no evidence of any interatrial shunt. Additional Comments: Spectral Doppler performed. Jodelle Red MD Electronically signed by Jodelle Red MD Signature Date/Time: 04/21/2023/2:22:14 PM    Final    EP STUDY Result Date: 04/21/2023 See surgical note for result.     Scheduled Meds:  diltiazem  180 mg Oral Daily   docusate sodium  100 mg Oral BID   folic acid  1 mg Oral Daily   ketorolac  15 mg Intravenous Q6H   lactulose  30 g Oral TID   metoprolol succinate  50 mg Oral Daily   senna  1 tablet Oral BID   sodium chloride flush  3-10 mL Intravenous Q12H   thiamine  100 mg Oral Daily   Continuous Infusions:  DAPTOmycin 800 mg (04/22/23 1451)     LOS: 7 days   Time spent:  25 minutes   Noralee Stain, DO [facility] 04/23/2023, 12:39 PM   Available via Epic secure chat 7am-7pm After these hours, please refer to coverage provider listed on [email or web address]

## 2023-04-23 NOTE — Plan of Care (Signed)
  Problem: Health Behavior/Discharge Planning: Goal: Ability to manage health-related needs will improve Outcome: Progressing   Problem: Clinical Measurements: Goal: Ability to maintain clinical measurements within normal limits will improve Outcome: Progressing Goal: Will remain free from infection Outcome: Progressing Goal: Diagnostic test results will improve Outcome: Progressing Goal: Respiratory complications will improve Outcome: Progressing Goal: Cardiovascular complication will be avoided Outcome: Progressing   Problem: Activity: Goal: Risk for activity intolerance will decrease Outcome: Progressing   Problem: Nutrition: Goal: Adequate nutrition will be maintained Outcome: Progressing   Problem: Coping: Goal: Level of anxiety will decrease Outcome: Progressing   Problem: Elimination: Goal: Will not experience complications related to bowel motility Outcome: Progressing Goal: Will not experience complications related to urinary retention Outcome: Progressing   Problem: Pain Managment: Goal: General experience of comfort will improve and/or be controlled Outcome: Progressing   Problem: Safety: Goal: Ability to remain free from injury will improve Outcome: Progressing

## 2023-04-24 DIAGNOSIS — M00061 Staphylococcal arthritis, right knee: Secondary | ICD-10-CM | POA: Diagnosis not present

## 2023-04-24 LAB — CULTURE, BLOOD (ROUTINE X 2): Special Requests: ADEQUATE

## 2023-04-24 LAB — MINIMUM INHIBITORY CONC. (1 DRUG)

## 2023-04-24 LAB — MIC RESULT

## 2023-04-24 MED ORDER — KETOROLAC TROMETHAMINE 15 MG/ML IJ SOLN
15.0000 mg | Freq: Four times a day (QID) | INTRAMUSCULAR | Status: DC
Start: 2023-04-24 — End: 2023-04-24

## 2023-04-24 NOTE — Progress Notes (Addendum)
 PROGRESS NOTE    Kennth Vanbenschoten  ZOX:[id number] DOB: [birth year] DOA: 04/16/2023 PCP: Candi Leash, MD     Brief Narrative:  Dervin Vore is a 58 yr old male with PMH significant for Liver cirrhosis, s/p TIPS procedure, Essential hypertension, polysubstance abuse, Atrial Fibrillation , recent right plateau fracture requiring ORIF on 12/13/22.  Patient was hospitalized from 12/27/22 -02/04/2023 with purulent discharge from the right knee, thought to have post operative septic arthritis. Patient was managed with IV ertapenem. Patient was evaluated by infectious disease and patient was discharged on omadacycline. Patient was advised to follow-up with orthopedics and infectious diseases. Patient had undergone irrigation and debridement of right knee three times as an outpatient. Recently has not followed up with orthopedics, now presented with worsening pain and swelling of right knee associated with purulent drainage. Right knee x-ray showed  large joint effusion.  Soft tissue edema.  Orthopedics and infectious disease consulted.  Blood cultures positive for MRSA.  He underwent I&D and hardware removal of his right knee on 2/19.  He underwent TEE on 2/20 which was negative for endocarditis.  New events last 24 hours / Subjective: No new issues. 2 BM recorded yesterday   Assessment & Plan:   Principal Problem:   Septic joint of right knee joint (HCC) Active Problems:   Thrombocytopenia (HCC)   Essential hypertension   Hepatic cirrhosis (HCC)   Polysubstance abuse (HCC)   S/P TIPS (transjugular intrahepatic portosystemic shunt)   Atrial fibrillation, chronic (HCC)   MRSA bacteremia   Effusion of right knee joint   Bacteremia   Septic arthritis of right knee, MRSA bacteremia -History of previous I&D as outpatient, completed treatment with IV or ertapenem and subsequently omadacycline -Orthopedic surgery consulted, s/p hardware removal and I&D 2/19. Wound vac in place  -Blood  cultures positive for MRSA -TEE 2/20 negative for endocarditis -Infectious disease signed off 2/21  -Poor candidate for PICC line -Continue daptomycin for 2 weeks, last day 05/03/2023. Consult ID on 3/4 to arrange follow up and outpatient dosing  Chronic A-fib -CHA2DS2-VASc 1  -Not on anticoagulation -Metoprolol, Cardizem -Cardiology signed off 2/20  Liver cirrhosis status post TIPS, thrombocytopenia -Lactulose  Polysubstance abuse -Counseling  Obesity -Estimated body mass index is 35.33 kg/m as calculated from the following:   Height as of this encounter: 6' 0.01" (1.829 m).   Weight as of this encounter: 118.2 kg.    DVT prophylaxis:  SCDs Start: 04/20/23 1253 Place and maintain sequential compression device Start: 04/16/23 1702  Code Status: Full code Family Communication: None at bedside Disposition Plan: Skilled nursing facility Status is: Inpatient Remains inpatient appropriate because: IV antibiotics until 05/03/2023  Antimicrobials:  Anti-infectives (From admission, onward)    Start     Dose/Rate Route Frequency Ordered Stop   04/20/23 1109  vancomycin (VANCOCIN) powder  Status:  Discontinued          As needed 04/20/23 1109 04/20/23 1137   04/20/23 1109  tobramycin (NEBCIN) powder  Status:  Discontinued          As needed 04/20/23 1109 04/20/23 1137   04/18/23 1400  DAPTOmycin (CUBICIN) 800 mg in sodium chloride 0.9 % IVPB       Placed in "And" Linked Group   8 mg/kg  93.8 kg (Adjusted) 132 mL/hr over 30 Minutes Intravenous Daily 04/18/23 0754     04/18/23 0500  piperacillin-tazobactam (ZOSYN) IVPB 3.375 g  Status:  Discontinued        3.375 g 12.5 mL/hr  over 240 Minutes Intravenous Every 8 hours 04/17/23 1002 04/17/23 1026   04/17/23 2300  piperacillin-tazobactam (ZOSYN) IVPB 3.375 g  Status:  Discontinued       Placed in "Followed by" Linked Group   3.375 g 12.5 mL/hr over 240 Minutes Intravenous Every 8 hours 04/16/23 1609 04/17/23 0007   04/17/23 2100   DAPTOmycin (CUBICIN) 800 mg in sodium chloride 0.9 % IVPB  Status:  Discontinued        8 mg/kg  93.8 kg (Adjusted) 132 mL/hr over 30 Minutes Intravenous Daily 04/17/23 1249 04/18/23 0754   04/17/23 1930  vancomycin (VANCOCIN) IVPB 1000 mg/200 mL premix  Status:  Discontinued        1,000 mg 200 mL/hr over 60 Minutes Intravenous Every 12 hours 04/17/23 1016 04/18/23 0748   04/17/23 0730  vancomycin (VANCOREADY) IVPB 1250 mg/250 mL  Status:  Discontinued        1,250 mg 166.7 mL/hr over 90 Minutes Intravenous Every 12 hours 04/16/23 1846 04/17/23 1016   04/17/23 0500  vancomycin (VANCOREADY) IVPB 1250 mg/250 mL  Status:  Discontinued        1,250 mg 166.7 mL/hr over 90 Minutes Intravenous Every 12 hours 04/16/23 1628 04/16/23 1846   04/17/23 0100  ertapenem (INVANZ) 1 g in sodium chloride 0.9 % 100 mL IVPB        1 g 200 mL/hr over 30 Minutes Intravenous  Once 04/17/23 0008 04/17/23 0516   04/16/23 1615  vancomycin (VANCOREADY) IVPB 2000 mg/400 mL        2,000 mg 200 mL/hr over 120 Minutes Intravenous  Once 04/16/23 1609 04/16/23 2319   04/16/23 1615  piperacillin-tazobactam (ZOSYN) IVPB 3.375 g       Placed in "Followed by" Linked Group   3.375 g 100 mL/hr over 30 Minutes Intravenous  Once 04/16/23 1609 04/16/23 1717   04/16/23 1600  ceFAZolin (ANCEF) IVPB 1 g/50 mL premix        1 g 100 mL/hr over 30 Minutes Intravenous  Once 04/16/23 1554 04/16/23 1807        Objective: Vitals:   04/23/23 1954 04/24/23 0404 04/24/23 0755 04/24/23 0945  BP: 119/74 (!) 141/86 (!) 148/93 (!) 145/92  Pulse: 78 97 94   Resp: 16 16 20    Temp: 98.6 F (37 C) 98 F (36.7 C) 98.8 F (37.1 C) 97.7 F (36.5 C)  TempSrc: Oral Oral Axillary Oral  SpO2: 96% 96% 100%   Weight:      Height:        Intake/Output Summary (Last 24 hours) at 04/24/2023 1102 Last data filed at 04/24/2023 0755 Gross per 24 hour  Intake 264 ml  Output 1050 ml  Net -786 ml   Filed Weights   04/16/23 1819  Weight:  118.2 kg    Examination:  General exam: Appears calm  Respiratory system: Respiratory effort normal. No respiratory distress. No conversational dyspnea.  Cardiovascular system: A Fib rate 80s  Extremities: Right LE wound vac in place    Data Reviewed: I have personally reviewed following labs and imaging studies  CBC: Recent Labs  Lab 04/18/23 0706 04/20/23 0320 04/21/23 0206 04/22/23 0301  WBC 8.0 6.8 8.6 13.9*  NEUTROABS 5.8  --   --   --   HGB 12.9* 14.2 12.2* 12.0*  HCT 39.6 43.0 36.6* 36.4*  MCV 92.5 89.4 88.4 89.9  PLT 133* 184 239 314   Basic Metabolic Panel: Recent Labs  Lab 04/18/23 0706 04/20/23 0320 04/21/23 0206  NA 130* 135 134*  K 4.2 3.6 4.3  CL 101 99 100  CO2 22 24 26   GLUCOSE 116* 101* 99  BUN 26* 31* 23*  CREATININE 0.97 0.95 0.84  CALCIUM 8.7* 9.4 8.8*  MG 2.1 2.1  --   PHOS 2.6 3.1  --    GFR: Estimated Creatinine Clearance: 130.3 mL/min (by C-G formula based on SCr of 0.84 mg/dL). Liver Function Tests: No results for input(s): "AST", "ALT", "ALKPHOS", "BILITOT", "PROT", "ALBUMIN" in the last 168 hours.  No results for input(s): "LIPASE", "AMYLASE" in the last 168 hours. No results for input(s): "AMMONIA" in the last 168 hours.  Coagulation Profile: Recent Labs  Lab 04/20/23 1413  INR 1.4*   Cardiac Enzymes: Recent Labs  Lab 04/19/23 0721  CKTOTAL 14*   BNP (last 3 results) No results for input(s): "PROBNP" in the last 8760 hours. HbA1C: No results for input(s): "HGBA1C" in the last 72 hours. CBG: No results for input(s): "GLUCAP" in the last 168 hours.  Lipid Profile: No results for input(s): "CHOL", "HDL", "LDLCALC", "TRIG", "CHOLHDL", "LDLDIRECT" in the last 72 hours. Thyroid Function Tests: No results for input(s): "TSH", "T4TOTAL", "FREET4", "T3FREE", "THYROIDAB" in the last 72 hours. Anemia Panel: No results for input(s): "VITAMINB12", "FOLATE", "FERRITIN", "TIBC", "IRON", "RETICCTPCT" in the last 72 hours. Sepsis  Labs: No results for input(s): "PROCALCITON", "LATICACIDVEN" in the last 168 hours.   Recent Results (from the past 240 hours)  Blood culture (routine x 2)     Status: Abnormal (Preliminary result)   Collection Time: 04/16/23  1:44 PM   Specimen: BLOOD  Result Value Ref Range Status   Specimen Description BLOOD SITE NOT SPECIFIED  Final   Special Requests   Final    BOTTLES DRAWN AEROBIC AND ANAEROBIC Blood Culture adequate volume   Culture  Setup Time   Final    GRAM POSITIVE COCCI IN CLUSTERS IN BOTH AEROBIC AND ANAEROBIC BOTTLES CRITICAL RESULT CALLED TO, READ BACK BY AND VERIFIED WITH: PHARMD JENNY ZHOU [id number] 0800 BY J RAZZAK, MT    Culture (A)  Final    METHICILLIN RESISTANT STAPHYLOCOCCUS AUREUS Sent to [facility] for further susceptibility testing. Performed at [facility], [address] [place], [place] [address]    Report Status PENDING  Incomplete   Organism ID, Bacteria METHICILLIN RESISTANT STAPHYLOCOCCUS AUREUS  Final      Susceptibility   Methicillin resistant staphylococcus aureus - MIC*    CIPROFLOXACIN >=8 RESISTANT Resistant     ERYTHROMYCIN >=8 RESISTANT Resistant     GENTAMICIN <=0.5 SENSITIVE Sensitive     OXACILLIN >=4 RESISTANT Resistant     TETRACYCLINE >=16 RESISTANT Resistant     VANCOMYCIN <=0.5 SENSITIVE Sensitive     TRIMETH/SULFA 160 RESISTANT Resistant     CLINDAMYCIN <=0.25 SENSITIVE Sensitive     RIFAMPIN <=0.5 SENSITIVE Sensitive     Inducible Clindamycin NEGATIVE Sensitive     LINEZOLID 2 SENSITIVE Sensitive     * METHICILLIN RESISTANT STAPHYLOCOCCUS AUREUS  Blood Culture ID Panel (Reflexed)     Status: Abnormal   Collection Time: 04/16/23  1:44 PM  Result Value Ref Range Status   Enterococcus faecalis NOT DETECTED NOT DETECTED Final   Enterococcus Faecium NOT DETECTED NOT DETECTED Final   Listeria monocytogenes NOT DETECTED NOT DETECTED Final   Staphylococcus species DETECTED (A) NOT DETECTED Final    Comment: CRITICAL RESULT  CALLED TO, READ BACK BY AND VERIFIED WITH: PHARMD JENNY ZHOU [phone] 0800 BY J RAZZAK, MT  Staphylococcus aureus (BCID) DETECTED (A) NOT DETECTED Final    Comment: Methicillin (oxacillin)-resistant Staphylococcus aureus (MRSA). MRSA is predictably resistant to beta-lactam antibiotics (except ceftaroline). Preferred therapy is vancomycin unless clinically contraindicated. Patient requires contact precautions if  hospitalized. CRITICAL RESULT CALLED TO, READ BACK BY AND VERIFIED WITH: PHARMD JENNY ZHOU [id number] 0800 BY J RAZZAK, MT    Staphylococcus epidermidis NOT DETECTED NOT DETECTED Final   Staphylococcus lugdunensis NOT DETECTED NOT DETECTED Final   Streptococcus species NOT DETECTED NOT DETECTED Final   Streptococcus agalactiae NOT DETECTED NOT DETECTED Final   Streptococcus pneumoniae NOT DETECTED NOT DETECTED Final   Streptococcus pyogenes NOT DETECTED NOT DETECTED Final   A.calcoaceticus-baumannii NOT DETECTED NOT DETECTED Final   Bacteroides fragilis NOT DETECTED NOT DETECTED Final   Enterobacterales NOT DETECTED NOT DETECTED Final   Enterobacter cloacae complex NOT DETECTED NOT DETECTED Final   Escherichia coli NOT DETECTED NOT DETECTED Final   Klebsiella aerogenes NOT DETECTED NOT DETECTED Final   Klebsiella oxytoca NOT DETECTED NOT DETECTED Final   Klebsiella pneumoniae NOT DETECTED NOT DETECTED Final   Proteus species NOT DETECTED NOT DETECTED Final   Salmonella species NOT DETECTED NOT DETECTED Final   Serratia marcescens NOT DETECTED NOT DETECTED Final   Haemophilus influenzae NOT DETECTED NOT DETECTED Final   Neisseria meningitidis NOT DETECTED NOT DETECTED Final   Pseudomonas aeruginosa NOT DETECTED NOT DETECTED Final   Stenotrophomonas maltophilia NOT DETECTED NOT DETECTED Final   Candida albicans NOT DETECTED NOT DETECTED Final   Candida auris NOT DETECTED NOT DETECTED Final   Candida glabrata NOT DETECTED NOT DETECTED Final   Candida krusei NOT DETECTED NOT  DETECTED Final   Candida parapsilosis NOT DETECTED NOT DETECTED Final   Candida tropicalis NOT DETECTED NOT DETECTED Final   Cryptococcus neoformans/gattii NOT DETECTED NOT DETECTED Final   Meth resistant mecA/C and MREJ DETECTED (A) NOT DETECTED Final    Comment: CRITICAL RESULT CALLED TO, READ BACK BY AND VERIFIED WITH: PHARMD JENNY ZHOU [phone] BY Berline Chough, MT Performed at [facility], [address] [place], [place] [address]   MIC (1 Drug)-blood culture; 04/16/2023; BLOOD; MRSA; Daptomycin     Status: Abnormal   Collection Time: 04/16/23  1:44 PM   Specimen: BLOOD  Result Value Ref Range Status   Min Inhibitory Conc (1 Drug) Preliminary report (A)  Final    Comment: (NOTE) Performed At: [facility] [address] [place], [place] [id number] Jolene Schimke MD VH:[id number]    Source BLOOD  Final    Comment: Performed at [facility], [address], [place], [place] [address]  Culture, blood (Routine X 2) w Reflex to ID Panel     Status: Abnormal   Collection Time: 04/16/23  3:08 PM   Specimen: BLOOD LEFT ARM  Result Value Ref Range Status   Specimen Description BLOOD LEFT ARM  Final   Special Requests   Final    BOTTLES DRAWN AEROBIC AND ANAEROBIC Blood Culture results may not be optimal due to an inadequate volume of blood received in culture bottles   Culture  Setup Time   Final    GRAM POSITIVE COCCI IN CLUSTERS IN BOTH AEROBIC AND ANAEROBIC BOTTLES CRITICAL VALUE NOTED.  VALUE IS CONSISTENT WITH PREVIOUSLY REPORTED AND CALLED VALUE.    Culture (A)  Final    STAPHYLOCOCCUS AUREUS SUSCEPTIBILITIES PERFORMED ON PREVIOUS CULTURE WITHIN THE LAST 5 DAYS. Performed at [facility], [address], [place], [place] [address]  Report Status 04/19/2023 FINAL  Final  Culture, blood (Routine X 2) w Reflex to ID Panel     Status: None   Collection Time: 04/18/23  7:06 AM   Specimen: BLOOD RIGHT HAND  Result Value Ref Range Status   Specimen  Description BLOOD RIGHT HAND  Final   Special Requests   Final    BOTTLES DRAWN AEROBIC AND ANAEROBIC Blood Culture results may not be optimal due to an inadequate volume of blood received in culture bottles   Culture   Final    NO GROWTH 5 DAYS Performed at [facility], [address], [place], [place] [address]    Report Status 04/23/2023 FINAL  Final  Culture, blood (Routine X 2) w Reflex to ID Panel     Status: None   Collection Time: 04/18/23  7:08 AM   Specimen: BLOOD  Result Value Ref Range Status   Specimen Description BLOOD RIGHT ANTECUBITAL  Final   Special Requests   Final    BOTTLES DRAWN AEROBIC AND ANAEROBIC Blood Culture results may not be optimal due to an inadequate volume of blood received in culture bottles   Culture   Final    NO GROWTH 5 DAYS Performed at [facility], [address], [place], [place] [address]    Report Status 04/23/2023 FINAL  Final  Surgical pcr screen     Status: None   Collection Time: 04/20/23  7:36 AM   Specimen: Nasal Mucosa; Nasal Swab  Result Value Ref Range Status   MRSA, PCR NEGATIVE NEGATIVE Final   Staphylococcus aureus NEGATIVE NEGATIVE Final    Comment: (NOTE) The Xpert SA Assay (FDA approved for NASAL specimens in patients 16 years of age and older), is one component of a comprehensive surveillance program. It is not intended to diagnose infection nor to guide or monitor treatment. Performed at [facility], [address], [place], [place] [address]   Aerobic/Anaerobic Culture w Gram Stain (surgical/deep wound)     Status: None (Preliminary result)   Collection Time: 04/20/23 11:00 AM   Specimen: PATH Cytology Misc. fluid; Body Fluid  Result Value Ref Range Status   Specimen Description FLUID  Final   Special Requests NONE  Final   Gram Stain   Final    ABUNDANT WBC PRESENT, PREDOMINANTLY PMN RARE GRAM POSITIVE COCCI IN PAIRS Performed at [facility], [address], [place], [place]  [address]    Culture   Final    RARE METHICILLIN RESISTANT STAPHYLOCOCCUS AUREUS NO ANAEROBES ISOLATED; CULTURE IN PROGRESS FOR 5 DAYS    Report Status PENDING  Incomplete   Organism ID, Bacteria METHICILLIN RESISTANT STAPHYLOCOCCUS AUREUS  Final      Susceptibility   Methicillin resistant staphylococcus aureus - MIC*    CIPROFLOXACIN >=8 RESISTANT Resistant     ERYTHROMYCIN >=8 RESISTANT Resistant     GENTAMICIN <=0.5 SENSITIVE Sensitive     OXACILLIN >=4 RESISTANT Resistant     TETRACYCLINE >=16 RESISTANT Resistant     VANCOMYCIN 1 SENSITIVE Sensitive     TRIMETH/SULFA >=320 RESISTANT Resistant     CLINDAMYCIN <=0.25 SENSITIVE Sensitive     RIFAMPIN <=0.5 SENSITIVE Sensitive     Inducible Clindamycin NEGATIVE Sensitive     LINEZOLID 2 SENSITIVE Sensitive     * RARE METHICILLIN RESISTANT STAPHYLOCOCCUS AUREUS  Aerobic/Anaerobic Culture w Gram Stain (surgical/deep wound)     Status: None (Preliminary result)   Collection Time: 04/20/23 11:00 AM  Specimen: PATH Soft tissue  Result Value Ref Range Status   Specimen Description FLUID  Final   Special Requests NONE  Final   Gram Stain   Final    RARE WBC PRESENT, PREDOMINANTLY MONONUCLEAR NO ORGANISMS SEEN Performed at [facility], [address], [place], [place] [address]    Culture   Final    RARE STAPHYLOCOCCUS AUREUS SUSCEPTIBILITIES PERFORMED ON PREVIOUS CULTURE WITHIN THE LAST 5 DAYS. NO ANAEROBES ISOLATED; CULTURE IN PROGRESS FOR 5 DAYS    Report Status PENDING  Incomplete  Aerobic/Anaerobic Culture w Gram Stain (surgical/deep wound)     Status: None (Preliminary result)   Collection Time: 04/20/23 11:06 AM   Specimen: PATH Cytology Misc. fluid; Body Fluid  Result Value Ref Range Status   Specimen Description FLUID  Final   Special Requests NONE  Final   Gram Stain   Final    ABUNDANT WBC PRESENT, PREDOMINANTLY PMN NO ORGANISMS SEEN Performed at [facility], [address] [place], [place] [address]     Culture   Final    RARE STAPHYLOCOCCUS AUREUS SUSCEPTIBILITIES TO FOLLOW NO ANAEROBES ISOLATED; CULTURE IN PROGRESS FOR 5 DAYS    Report Status PENDING  Incomplete      Radiology Studies: No results found.     Scheduled Meds:  diltiazem  180 mg Oral Daily   docusate sodium  100 mg Oral BID   folic acid  1 mg Oral Daily   ketorolac  15 mg Intravenous Q6H   lactulose  30 g Oral TID   metoprolol succinate  50 mg Oral Daily   senna  1 tablet Oral BID   sodium chloride flush  3-10 mL Intravenous Q12H   thiamine  100 mg Oral Daily   Continuous Infusions:  DAPTOmycin 800 mg (04/23/23 1451)     LOS: 8 days   Time spent: 25 minutes   Noralee Stain, DO [facility] 04/24/2023, 11:02 AM   Available via Epic secure chat 7am-7pm After these hours, please refer to coverage provider listed on [email or web address]

## 2023-04-24 NOTE — Plan of Care (Signed)

## 2023-04-25 DIAGNOSIS — M00061 Staphylococcal arthritis, right knee: Secondary | ICD-10-CM | POA: Diagnosis not present

## 2023-04-25 LAB — AEROBIC/ANAEROBIC CULTURE W GRAM STAIN (SURGICAL/DEEP WOUND)

## 2023-04-25 MED ORDER — POLYETHYLENE GLYCOL 3350 17 G PO PACK
17.0000 g | PACK | Freq: Every day | ORAL | Status: DC
  Administered 2023-04-26 – 2023-05-02 (×6): 17 g via ORAL
  Filled 2023-04-25 (×8): qty 1

## 2023-04-25 NOTE — Progress Notes (Signed)
 PROGRESS NOTE    Jonathan Ibarra  WUJ:[id number] DOB: 1965/08/23 DOA: 04/16/2023 PCP: Candi Leash, MD     Brief Narrative:  Jonathan Ibarra is a 58 yr old male with PMH significant for Liver cirrhosis, s/p TIPS procedure, Essential hypertension, polysubstance abuse, Atrial Fibrillation , recent right plateau fracture requiring ORIF on 12/13/22.  Patient was hospitalized from 12/27/22 -02/04/2023 with purulent discharge from the right knee, thought to have post operative septic arthritis. Patient was managed with IV ertapenem. Patient was evaluated by infectious disease and patient was discharged on omadacycline. Patient was advised to follow-up with orthopedics and infectious diseases. Patient had undergone irrigation and debridement of right knee three times as an outpatient. Recently has not followed up with orthopedics, now presented with worsening pain and swelling of right knee associated with purulent drainage. Right knee x-ray showed  large joint effusion.  Soft tissue edema.  Orthopedics and infectious disease consulted.  Blood cultures positive for MRSA.  He underwent I&D and hardware removal of his right knee on 2/19.  He underwent TEE on 2/20 which was negative for endocarditis.  New events last 24 hours / Subjective: Continues to complain of pain  Assessment & Plan:   Principal Problem:   Septic joint of right knee joint (HCC) Active Problems:   Thrombocytopenia (HCC)   Essential hypertension   Hepatic cirrhosis (HCC)   Polysubstance abuse (HCC)   S/P TIPS (transjugular intrahepatic portosystemic shunt)   Atrial fibrillation, chronic (HCC)   MRSA bacteremia   Effusion of right knee joint   Bacteremia   Septic arthritis of right knee, MRSA bacteremia -History of previous I&D as outpatient, completed treatment with IV or ertapenem and subsequently omadacycline -Orthopedic surgery consulted, s/p hardware removal and I&D 2/19. Wound vac in place  -Blood cultures  positive for MRSA -TEE 2/20 negative for endocarditis -Infectious disease signed off 2/21  -Poor candidate for PICC line -Continue daptomycin for 2 weeks, last day 05/03/2023. Consult ID on 3/4 to arrange follow up and outpatient dosing  Chronic A-fib -CHA2DS2-VASc 1  -Not on anticoagulation -Metoprolol, Cardizem -Cardiology signed off 2/20  Liver cirrhosis status post TIPS, thrombocytopenia -Lactulose  Polysubstance abuse -Counseling  Obesity -Estimated body mass index is 35.33 kg/m as calculated from the following:   Height as of this encounter: 6' 0.01" (1.829 m).   Weight as of this encounter: 118.2 kg.    DVT prophylaxis:  SCDs Start: 04/20/23 1253 Place and maintain sequential compression device Start: 04/16/23 1702  Code Status: Full code Family Communication: None at bedside Disposition Plan: Skilled nursing facility Status is: Inpatient Remains inpatient appropriate because: IV antibiotics until 05/03/2023  Antimicrobials:  Anti-infectives (From admission, onward)    Start     Dose/Rate Route Frequency Ordered Stop   04/20/23 1109  vancomycin (VANCOCIN) powder  Status:  Discontinued          As needed 04/20/23 1109 04/20/23 1137   04/20/23 1109  tobramycin (NEBCIN) powder  Status:  Discontinued          As needed 04/20/23 1109 04/20/23 1137   04/18/23 1400  DAPTOmycin (CUBICIN) 800 mg in sodium chloride 0.9 % IVPB       Placed in "And" Linked Group   8 mg/kg  93.8 kg (Adjusted) 132 mL/hr over 30 Minutes Intravenous Daily 04/18/23 0754     04/18/23 0500  piperacillin-tazobactam (ZOSYN) IVPB 3.375 g  Status:  Discontinued        3.375 g 12.5 mL/hr over 240 Minutes  Intravenous Every 8 hours 04/17/23 1002 04/17/23 1026   04/17/23 2300  piperacillin-tazobactam (ZOSYN) IVPB 3.375 g  Status:  Discontinued       Placed in "Followed by" Linked Group   3.375 g 12.5 mL/hr over 240 Minutes Intravenous Every 8 hours 04/16/23 1609 04/17/23 0007   04/17/23 2100   DAPTOmycin (CUBICIN) 800 mg in sodium chloride 0.9 % IVPB  Status:  Discontinued        8 mg/kg  93.8 kg (Adjusted) 132 mL/hr over 30 Minutes Intravenous Daily 04/17/23 1249 04/18/23 0754   04/17/23 1930  vancomycin (VANCOCIN) IVPB 1000 mg/200 mL premix  Status:  Discontinued        1,000 mg 200 mL/hr over 60 Minutes Intravenous Every 12 hours 04/17/23 1016 04/18/23 0748   04/17/23 0730  vancomycin (VANCOREADY) IVPB 1250 mg/250 mL  Status:  Discontinued        1,250 mg 166.7 mL/hr over 90 Minutes Intravenous Every 12 hours 04/16/23 1846 04/17/23 1016   04/17/23 0500  vancomycin (VANCOREADY) IVPB 1250 mg/250 mL  Status:  Discontinued        1,250 mg 166.7 mL/hr over 90 Minutes Intravenous Every 12 hours 04/16/23 1628 04/16/23 1846   04/17/23 0100  ertapenem (INVANZ) 1 g in sodium chloride 0.9 % 100 mL IVPB        1 g 200 mL/hr over 30 Minutes Intravenous  Once 04/17/23 0008 04/17/23 0516   04/16/23 1615  vancomycin (VANCOREADY) IVPB 2000 mg/400 mL        2,000 mg 200 mL/hr over 120 Minutes Intravenous  Once 04/16/23 1609 04/16/23 2319   04/16/23 1615  piperacillin-tazobactam (ZOSYN) IVPB 3.375 g       Placed in "Followed by" Linked Group   3.375 g 100 mL/hr over 30 Minutes Intravenous  Once 04/16/23 1609 04/16/23 1717   04/16/23 1600  ceFAZolin (ANCEF) IVPB 1 g/50 mL premix        1 g 100 mL/hr over 30 Minutes Intravenous  Once 04/16/23 1554 04/16/23 1807        Objective: Vitals:   04/24/23 1655 04/24/23 2155 04/25/23 0345 04/25/23 1236  BP: 120/84 (!) 132/92 133/88 124/78  Pulse: 80 87 89   Resp: (!) 22 14 16 20   Temp: 98.1 F (36.7 C) 97.6 F (36.4 C) 98.4 F (36.9 C) (!) 97.4 F (36.3 C)  TempSrc: Oral Oral Oral Oral  SpO2: 100% 100% 92%   Weight:      Height:        Intake/Output Summary (Last 24 hours) at 04/25/2023 1254 Last data filed at 04/25/2023 0300 Gross per 24 hour  Intake 546 ml  Output 1050 ml  Net -504 ml   Filed Weights   04/16/23 1819  Weight:  118.2 kg    Examination:  General exam: Appears calm  Respiratory system: Respiratory effort normal. No respiratory distress. No conversational dyspnea.  Cardiovascular system: A Fib rate 100s  Extremities: Right LE wound vac in place    Data Reviewed: I have personally reviewed following labs and imaging studies  CBC: Recent Labs  Lab 04/20/23 0320 04/21/23 0206 04/22/23 0301  WBC 6.8 8.6 13.9*  HGB 14.2 12.2* 12.0*  HCT 43.0 36.6* 36.4*  MCV 89.4 88.4 89.9  PLT 184 239 314   Basic Metabolic Panel: Recent Labs  Lab 04/20/23 0320 04/21/23 0206  NA 135 134*  K 3.6 4.3  CL 99 100  CO2 24 26  GLUCOSE 101* 99  BUN 31* 23*  CREATININE 0.95 0.84  CALCIUM 9.4 8.8*  MG 2.1  --   PHOS 3.1  --    GFR: Estimated Creatinine Clearance: 130.3 mL/min (by C-G formula based on SCr of 0.84 mg/dL). Liver Function Tests: No results for input(s): "AST", "ALT", "ALKPHOS", "BILITOT", "PROT", "ALBUMIN" in the last 168 hours.  No results for input(s): "LIPASE", "AMYLASE" in the last 168 hours. No results for input(s): "AMMONIA" in the last 168 hours.  Coagulation Profile: Recent Labs  Lab 04/20/23 1413  INR 1.4*   Cardiac Enzymes: Recent Labs  Lab 04/19/23 0721  CKTOTAL 14*   BNP (last 3 results) No results for input(s): "PROBNP" in the last 8760 hours. HbA1C: No results for input(s): "HGBA1C" in the last 72 hours. CBG: No results for input(s): "GLUCAP" in the last 168 hours.  Lipid Profile: No results for input(s): "CHOL", "HDL", "LDLCALC", "TRIG", "CHOLHDL", "LDLDIRECT" in the last 72 hours. Thyroid Function Tests: No results for input(s): "TSH", "T4TOTAL", "FREET4", "T3FREE", "THYROIDAB" in the last 72 hours. Anemia Panel: No results for input(s): "VITAMINB12", "FOLATE", "FERRITIN", "TIBC", "IRON", "RETICCTPCT" in the last 72 hours. Sepsis Labs: No results for input(s): "PROCALCITON", "LATICACIDVEN" in the last 168 hours.   Recent Results (from the past 240  hours)  Blood culture (routine x 2)     Status: Abnormal   Collection Time: 04/16/23  1:44 PM   Specimen: BLOOD  Result Value Ref Range Status   Specimen Description BLOOD SITE NOT SPECIFIED  Final   Special Requests   Final    BOTTLES DRAWN AEROBIC AND ANAEROBIC Blood Culture adequate volume   Culture  Setup Time   Final    GRAM POSITIVE COCCI IN CLUSTERS IN BOTH AEROBIC AND ANAEROBIC BOTTLES CRITICAL RESULT CALLED TO, READ BACK BY AND VERIFIED WITH: PHARMD JENNY ZHOU [id number] 0800 BY J RAZZAK, MT    Culture (A)  Final    METHICILLIN RESISTANT STAPHYLOCOCCUS AUREUS Sent to [facility] for further susceptibility testing. SEE SEPARATE REPORT Performed at [facility], [address], [place], [place] [address]    Report Status 04/24/2023 FINAL  Final   Organism ID, Bacteria METHICILLIN RESISTANT STAPHYLOCOCCUS AUREUS  Final      Susceptibility   Methicillin resistant staphylococcus aureus - MIC*    CIPROFLOXACIN >=8 RESISTANT Resistant     ERYTHROMYCIN >=8 RESISTANT Resistant     GENTAMICIN <=0.5 SENSITIVE Sensitive     OXACILLIN >=4 RESISTANT Resistant     TETRACYCLINE >=16 RESISTANT Resistant     VANCOMYCIN <=0.5 SENSITIVE Sensitive     TRIMETH/SULFA 160 RESISTANT Resistant     CLINDAMYCIN <=0.25 SENSITIVE Sensitive     RIFAMPIN <=0.5 SENSITIVE Sensitive     Inducible Clindamycin NEGATIVE Sensitive     LINEZOLID 2 SENSITIVE Sensitive     * METHICILLIN RESISTANT STAPHYLOCOCCUS AUREUS  Blood Culture ID Panel (Reflexed)     Status: Abnormal   Collection Time: 04/16/23  1:44 PM  Result Value Ref Range Status   Enterococcus faecalis NOT DETECTED NOT DETECTED Final   Enterococcus Faecium NOT DETECTED NOT DETECTED Final   Listeria monocytogenes NOT DETECTED NOT DETECTED Final   Staphylococcus species DETECTED (A) NOT DETECTED Final    Comment: CRITICAL RESULT CALLED TO, READ BACK BY AND VERIFIED WITH: PHARMD JENNY ZHOU [phone] 0800 BY J RAZZAK, MT    Staphylococcus aureus  (BCID) DETECTED (A) NOT DETECTED Final    Comment: Methicillin (oxacillin)-resistant Staphylococcus aureus (MRSA). MRSA is predictably resistant to beta-lactam antibiotics (except ceftaroline). Preferred therapy is  vancomycin unless clinically contraindicated. Patient requires contact precautions if  hospitalized. CRITICAL RESULT CALLED TO, READ BACK BY AND VERIFIED WITH: PHARMD JENNY ZHOU [phone] 0800 BY J RAZZAK, MT    Staphylococcus epidermidis NOT DETECTED NOT DETECTED Final   Staphylococcus lugdunensis NOT DETECTED NOT DETECTED Final   Streptococcus species NOT DETECTED NOT DETECTED Final   Streptococcus agalactiae NOT DETECTED NOT DETECTED Final   Streptococcus pneumoniae NOT DETECTED NOT DETECTED Final   Streptococcus pyogenes NOT DETECTED NOT DETECTED Final   A.calcoaceticus-baumannii NOT DETECTED NOT DETECTED Final   Bacteroides fragilis NOT DETECTED NOT DETECTED Final   Enterobacterales NOT DETECTED NOT DETECTED Final   Enterobacter cloacae complex NOT DETECTED NOT DETECTED Final   Escherichia coli NOT DETECTED NOT DETECTED Final   Klebsiella aerogenes NOT DETECTED NOT DETECTED Final   Klebsiella oxytoca NOT DETECTED NOT DETECTED Final   Klebsiella pneumoniae NOT DETECTED NOT DETECTED Final   Proteus species NOT DETECTED NOT DETECTED Final   Salmonella species NOT DETECTED NOT DETECTED Final   Serratia marcescens NOT DETECTED NOT DETECTED Final   Haemophilus influenzae NOT DETECTED NOT DETECTED Final   Neisseria meningitidis NOT DETECTED NOT DETECTED Final   Pseudomonas aeruginosa NOT DETECTED NOT DETECTED Final   Stenotrophomonas maltophilia NOT DETECTED NOT DETECTED Final   Candida albicans NOT DETECTED NOT DETECTED Final   Candida auris NOT DETECTED NOT DETECTED Final   Candida glabrata NOT DETECTED NOT DETECTED Final   Candida krusei NOT DETECTED NOT DETECTED Final   Candida parapsilosis NOT DETECTED NOT DETECTED Final   Candida tropicalis NOT DETECTED NOT DETECTED  Final   Cryptococcus neoformans/gattii NOT DETECTED NOT DETECTED Final   Meth resistant mecA/C and MREJ DETECTED (A) NOT DETECTED Final    Comment: CRITICAL RESULT CALLED TO, READ BACK BY AND VERIFIED WITH: PHARMD JENNY ZHOU [phone] BY Berline Chough, MT Performed at [facility], [address] [place], [place] [address]   MIC (1 Drug)-blood culture; 04/16/2023; BLOOD; MRSA; Daptomycin     Status: Abnormal   Collection Time: 04/16/23  1:44 PM   Specimen: BLOOD  Result Value Ref Range Status   Min Inhibitory Conc (1 Drug) Final report (A)  Corrected    Comment: (NOTE) Performed At: [facility] [address] [place], [place] [id number] Jolene Schimke MD TD:[id number] CORRECTED ON 02/23 AT 1435: PREVIOUSLY REPORTED AS Preliminary report    Source BLOOD  Final    Comment: Performed at [facility], [address] [place], [place] [address]  Culture, blood (Routine X 2) w Reflex to ID Panel     Status: Abnormal   Collection Time: 04/16/23  3:08 PM   Specimen: BLOOD LEFT ARM  Result Value Ref Range Status   Specimen Description BLOOD LEFT ARM  Final   Special Requests   Final    BOTTLES DRAWN AEROBIC AND ANAEROBIC Blood Culture results may not be optimal due to an inadequate volume of blood received in culture bottles   Culture  Setup Time   Final    GRAM POSITIVE COCCI IN CLUSTERS IN BOTH AEROBIC AND ANAEROBIC BOTTLES CRITICAL VALUE NOTED.  VALUE IS CONSISTENT WITH PREVIOUSLY REPORTED AND CALLED VALUE.    Culture (A)  Final    STAPHYLOCOCCUS AUREUS SUSCEPTIBILITIES PERFORMED ON PREVIOUS CULTURE WITHIN THE LAST 5 DAYS. Performed at [facility], [address], [place], [place] [address]    Report Status 04/19/2023 FINAL  Final  Culture, blood (Routine X 2) w Reflex to ID Panel  Status: None   Collection Time: 04/18/23  7:06 AM   Specimen: BLOOD RIGHT HAND  Result Value Ref Range Status   Specimen Description BLOOD RIGHT HAND  Final   Special Requests    Final    BOTTLES DRAWN AEROBIC AND ANAEROBIC Blood Culture results may not be optimal due to an inadequate volume of blood received in culture bottles   Culture   Final    NO GROWTH 5 DAYS Performed at [facility], [address], [place], [place] [address]    Report Status 04/23/2023 FINAL  Final  Culture, blood (Routine X 2) w Reflex to ID Panel     Status: None   Collection Time: 04/18/23  7:08 AM   Specimen: BLOOD  Result Value Ref Range Status   Specimen Description BLOOD RIGHT ANTECUBITAL  Final   Special Requests   Final    BOTTLES DRAWN AEROBIC AND ANAEROBIC Blood Culture results may not be optimal due to an inadequate volume of blood received in culture bottles   Culture   Final    NO GROWTH 5 DAYS Performed at [facility], [address], [place], [place] [address]    Report Status 04/23/2023 FINAL  Final  Surgical pcr screen     Status: None   Collection Time: 04/20/23  7:36 AM   Specimen: Nasal Mucosa; Nasal Swab  Result Value Ref Range Status   MRSA, PCR NEGATIVE NEGATIVE Final   Staphylococcus aureus NEGATIVE NEGATIVE Final    Comment: (NOTE) The Xpert SA Assay (FDA approved for NASAL specimens in patients 36 years of age and older), is one component of a comprehensive surveillance program. It is not intended to diagnose infection nor to guide or monitor treatment. Performed at [facility], [address], [place], [place] [address]   Aerobic/Anaerobic Culture w Gram Stain (surgical/deep wound)     Status: None   Collection Time: 04/20/23 11:00 AM   Specimen: PATH Cytology Misc. fluid; Body Fluid  Result Value Ref Range Status   Specimen Description FLUID  Final   Special Requests NONE  Final   Gram Stain   Final    ABUNDANT WBC PRESENT, PREDOMINANTLY PMN RARE GRAM POSITIVE COCCI IN PAIRS    Culture   Final    RARE METHICILLIN RESISTANT STAPHYLOCOCCUS AUREUS NO ANAEROBES ISOLATED Performed at [facility], [address],  [place], [place] [address]    Report Status 04/25/2023 FINAL  Final   Organism ID, Bacteria METHICILLIN RESISTANT STAPHYLOCOCCUS AUREUS  Final      Susceptibility   Methicillin resistant staphylococcus aureus - MIC*    CIPROFLOXACIN >=8 RESISTANT Resistant     ERYTHROMYCIN >=8 RESISTANT Resistant     GENTAMICIN <=0.5 SENSITIVE Sensitive     OXACILLIN >=4 RESISTANT Resistant     TETRACYCLINE >=16 RESISTANT Resistant     VANCOMYCIN 1 SENSITIVE Sensitive     TRIMETH/SULFA >=320 RESISTANT Resistant     CLINDAMYCIN <=0.25 SENSITIVE Sensitive     RIFAMPIN <=0.5 SENSITIVE Sensitive     Inducible Clindamycin NEGATIVE Sensitive     LINEZOLID 2 SENSITIVE Sensitive     * RARE METHICILLIN RESISTANT STAPHYLOCOCCUS AUREUS  Aerobic/Anaerobic Culture w Gram Stain (surgical/deep wound)     Status: None (Preliminary result)   Collection Time: 04/20/23 11:00 AM   Specimen: PATH Soft tissue  Result Value Ref Range Status   Specimen Description FLUID  Final   Special Requests NONE  Final   Gram  Stain   Final    RARE WBC PRESENT, PREDOMINANTLY MONONUCLEAR NO ORGANISMS SEEN Performed at [facility], [address] [place], [place] [address]    Culture   Final    RARE STAPHYLOCOCCUS AUREUS SUSCEPTIBILITIES PERFORMED ON PREVIOUS CULTURE WITHIN THE LAST 5 DAYS. NO ANAEROBES ISOLATED; CULTURE IN PROGRESS FOR 5 DAYS    Report Status PENDING  Incomplete  Aerobic/Anaerobic Culture w Gram Stain (surgical/deep wound)     Status: None (Preliminary result)   Collection Time: 04/20/23 11:06 AM   Specimen: PATH Cytology Misc. fluid; Body Fluid  Result Value Ref Range Status   Specimen Description FLUID  Final   Special Requests NONE  Final   Gram Stain   Final    ABUNDANT WBC PRESENT, PREDOMINANTLY PMN NO ORGANISMS SEEN Performed at [facility], [address] [place], [place] [address]    Culture   Final    RARE METHICILLIN RESISTANT STAPHYLOCOCCUS AUREUS NO ANAEROBES ISOLATED; CULTURE IN  PROGRESS FOR 5 DAYS    Report Status PENDING  Incomplete   Organism ID, Bacteria METHICILLIN RESISTANT STAPHYLOCOCCUS AUREUS  Final      Susceptibility   Methicillin resistant staphylococcus aureus - MIC*    CIPROFLOXACIN >=8 RESISTANT Resistant     ERYTHROMYCIN >=8 RESISTANT Resistant     GENTAMICIN <=0.5 SENSITIVE Sensitive     OXACILLIN >=4 RESISTANT Resistant     TETRACYCLINE >=16 RESISTANT Resistant     VANCOMYCIN 1 SENSITIVE Sensitive     TRIMETH/SULFA 160 RESISTANT Resistant     CLINDAMYCIN <=0.25 SENSITIVE Sensitive     RIFAMPIN <=0.5 SENSITIVE Sensitive     Inducible Clindamycin NEGATIVE Sensitive     LINEZOLID 2 SENSITIVE Sensitive     * RARE METHICILLIN RESISTANT STAPHYLOCOCCUS AUREUS      Radiology Studies: No results found.     Scheduled Meds:  diltiazem  180 mg Oral Daily   docusate sodium  100 mg Oral BID   folic acid  1 mg Oral Daily   lactulose  30 g Oral TID   metoprolol succinate  50 mg Oral Daily   senna  1 tablet Oral BID   sodium chloride flush  3-10 mL Intravenous Q12H   thiamine  100 mg Oral Daily   Continuous Infusions:  DAPTOmycin 800 mg (04/24/23 1658)     LOS: 9 days   Time spent: 25 minutes   Noralee Stain, DO [facility] 04/25/2023, 12:54 PM   Available via Epic secure chat 7am-7pm After these hours, please refer to coverage provider listed on [email or web address]

## 2023-04-25 NOTE — Progress Notes (Signed)
 Physical Therapy Treatment Patient Details Name: Jonathan Ibarra MRN: [id number] DOB: 1965-11-30 Today's Date: 04/25/2023   History of Present Illness Pt is a 58 yr old male who presented on 04/16/23 due to increase in edema and pain in r knee following a fall. Pt s/p 04/20/23 removal of hardware, I & D of R knee. Pt R LE WBAT with wound vac. PMH: liver cirrhosis, s/p TIPS, HTN, polysubstance abuse, afib, R plateau fx requiring ORIF on 12/13/22 and has had irrigation and debridement of R knee in OP setting    PT Comments  Patient received in bed, finished lunch and agrees to PT session. He is mod I with bed mobility. Transfers with supervision. No hands on assist. Ambulated in room ~60 feet with RW. No lob, pain limited. He will continue to benefit from skilled PT to improve strength and independence. Updated discharge recommendation.      If plan is discharge home, [name];[name];Assist for transportation;Help with stairs or ramp for entrance   Can travel by private vehicle     Yes  Equipment Recommendations  Rolling walker (2 wheels)    Recommendations for Other Services       Precautions / Restrictions Precautions Precautions: Fall Precaution/Restrictions Comments: wound vac Restrictions Weight Bearing Restrictions Per Provider Order: Yes RLE Weight Bearing Per Provider Order: Weight bearing as tolerated     Mobility  Bed Mobility Overal bed mobility: Modified Independent Bed Mobility: Supine to Sit, Sit to Supine     Supine to sit: Modified independent (Device/Increase time) Sit to supine: Modified independent (Device/Increase time)        Transfers Overall transfer level: Modified independent Equipment used: Rolling walker (2 wheels) Transfers: Sit to/from Stand Sit to Stand: Modified independent (Device/Increase time)                 Ambulation/Gait Ambulation/Gait assistance: Supervision Gait Distance (Feet): 60 Feet Assistive device: Rolling walker (2 wheels) Gait Pattern/deviations: Step-through pattern, Decreased step length - right, Decreased step length - left, Decreased weight shift to right, Trunk flexed Gait velocity: decr     General Gait Details: Supervision for ambulating 3 laps to door in room. 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     Tilt Bed    Modified Rankin (Stroke Patients Only)       Balance Overall balance assessment: Needs assistance Sitting-balance support: [name], During functional activity, Reliant on assistive device for balance Standing balance-Leahy Scale: Good Standing balance comment: supervision                            Communication Communication Communication: No apparent difficulties  Cognition Arousal: Alert Behavior During Therapy: WFL for tasks assessed/performed   PT - Cognitive impairments: No apparent impairments                         Following commands: Intact Following commands impaired: Only follows one step commands consistently    Cueing Cueing Techniques: Verbal cues  Exercises      General Comments        Pertinent Vitals/Pain Pain Assessment Pain Assessment: 0-10 Pain Score: 10-Worst pain ever Pain Location: R LE Pain Descriptors /  Indicators: Aching, Grimacing, Discomfort, Moaning Pain Intervention(s): RN gave pain meds during session, Repositioned    Home Living                          Prior Function            PT Goals (current goals can now be found in the care plan section) Acute Rehab PT Goals Patient Stated Goal: to improve mobility and return home. PT Goal Formulation: With patient Time For Goal Achievement:  05/05/23 Potential to Achieve Goals: Good Progress towards PT goals: Progressing toward goals    Frequency    Min 1X/week      PT Plan      Co-evaluation              AM-PAC PT "6 Clicks" Mobility   Outcome Measure  Help needed turning from your back to your side while in a flat bed without using bedrails?: None Help needed moving from lying on your back to sitting on the side of a flat bed without using bedrails?: None Help needed moving to and from a bed to a chair (including a wheelchair)?: A Little Help needed standing up from a chair using your arms (e.g., wheelchair or bedside chair)?: None Help needed to walk in hospital room?: A Little Help needed climbing 3-5 steps with a railing? : A Lot 6 Click Score: 20    End of Session   Activity Tolerance: Patient tolerated treatment well;Patient limited by pain Patient left: in bed;with call bell/phone within reach Nurse Communication: Mobility status PT Visit Diagnosis: Other abnormalities of gait and mobility (R26.89);Muscle weakness (generalized) (M62.81);Pain;Difficulty in walking, not elsewhere classified (R26.2) Pain - Right/Left: Right Pain - part of body: Knee     Time: 1330-1350 PT Time Calculation (min) (ACUTE ONLY): 20 min  Charges:    $Gait Training: 8-22 mins PT General Charges $$ ACUTE PT VISIT: 1 Visit                     Alain Deschene, PT, GCS 04/25/23,2:00 PM

## 2023-04-25 NOTE — Plan of Care (Signed)

## 2023-04-25 NOTE — Progress Notes (Signed)
 Orthopaedic Trauma Progress Note  SUBJECTIVE: Doing fair this morning.  Continues to note pain in the right knee.  Has had difficulty putting any weight on the right leg postoperatively.  Denies any significant numbness or tingling throughout the leg.  Notes swelling is stable about the knee.  No other issues of note.  Tolerating diet.  Tolerating his IV antibiotics.  Wound VAC canister was changed overnight, patient notes he continues to have significant amount of drainage.  OBJECTIVE:  Vitals:   04/24/23 2155 04/25/23 0345  BP: (!) 132/92 133/88  Pulse: 87 89  Resp: 14 16  Temp: 97.6 F (36.4 C) 98.4 F (36.9 C)  SpO2: 100% 92%    General: Resting comfortably in bed.  No acute distress  respiratory: No increased work of breathing.  Right lower extremity: Incisional VAC in place.  Continues to have good seal and function, about 50 mL serosanguineous fluid in wound VAC canister.  Swelling through the calf present but stable.  Compartments compressible.  Able to wiggle the toes.  Ankle motion intact but slightly stiff.  2+ DP pulse  IMAGING: Intraoperative imaging confirmed removal of all hardware   LABS:  No results found for this or any previous visit (from the past 24 hours).   ASSESSMENT: Jonathan Ibarra is a 58 y.o. male 1 day postop s/p  HARDWARE REMOVAL RIGHT TIBIAL PLATEAU IRRIGATION DEBRIDEMENT RIGHT KNEE  CV/Blood loss: Acute blood loss anemia.  Hemoglobin 12.0 on 04/22/2023.  Hemodynamically stable  PLAN: Weightbearing: WBAT RLE ROM: Motion as tolerated Incisional and dressing care: Reinforce as needed Showering: Hold off on showering until wound VAC removed  Orthopedic device(s): Incisional VAC RLE Pain management: Continue multimodal pain control  VTE prophylaxis: Aspirin, SCDs ID: Per infectious disease team Foley/Lines:  No foley, KVO IVFs Impediments to Fracture Healing: Infection Dispo: PT/OT as able.  Continue IV antibiotics per infectious disease.  Will  maintain incisional VAC to right lower extremity for now as patient continues to have notable drainage.  No further surgeries planned from orthopedic standpoint.  Follow - up plan: 2 weeks after discharge for wound check and repeat x-rays   Contact information:  Truitt Merle MD, Thyra Breed PA-C. After hours and holidays please check [email or web address] for group call information for [facility]   Thompson Caul, PA-C [phone] (office) [email or web address]

## 2023-04-25 NOTE — Progress Notes (Signed)
 Occupational Therapy Treatment Patient Details Name: Jonathan Ibarra MRN: [id number] DOB: 11-12-1965 Today's Date: 04/25/2023   History of present illness Pt is a 58 yr old male who presented on 04/16/23 due to increase in edema and pain in r knee following a fall. Pt s/p 04/20/23 removal of hardware, I & D of R knee. Pt R LE WBAT with wound vac. PMH: liver cirrhosis, s/p TIPS, HTN, polysubstance abuse, afib, R plateau fx requiring ORIF on 12/13/22 and has had irrigation and debridement of R knee in OP setting   OT comments  Pt progressing toward established OT goals. Needing min redirection at times as pt internally distracted, but follows all one step commands without difficulty and demos ability for quick learning with use of AE for LB ADL. Pt limited by pain this session. Updated dc recommendation to home with HHOT. Will continue to follow to optimize safety.       If plan is discharge home, recommend the following:  [name];Two people to help with bathing/dressing/bathroom;Assistance with cooking/housework;Assistance with feeding;Assist for transportation   Equipment Recommendations  Tub/shower bench    Recommendations for Other Services      Precautions / Restrictions Precautions Precautions: Fall Precaution/Restrictions Comments: wound vac RLE Restrictions Weight Bearing Restrictions Per Provider Order: Yes RLE Weight Bearing Per Provider Order: Weight bearing as tolerated       Mobility Bed Mobility Overal bed mobility: Modified Independent                  Transfers Overall transfer level: Modified independent Equipment used: Rolling walker (2 wheels) Transfers: Sit to/from Stand Sit to Stand: Supervision                 Balance Overall balance assessment: Needs assistance Sitting-balance support: [name],  During functional activity, Reliant on assistive device for balance Standing balance-Leahy Scale: Good Standing balance comment: supervision                           ADL either performed or assessed with clinical judgement   ADL Overall ADL's : Needs assistance/impaired     Grooming: Wash/dry hands;Wash/dry face;Set up;Sitting       Lower Body Bathing: Supervison/ safety;Sitting/lateral leans       Lower Body Dressing: Supervision/safety;Sit to/from stand;With adaptive equipment Lower Body Dressing Details (indicate cue type and reason): AE for reaching feet Toilet Transfer: Supervision/safety;Ambulation;Rolling walker (2 wheels)                  Extremity/Trunk Assessment              Vision       Perception     Praxis     Communication Communication Communication: No apparent difficulties   Cognition Arousal: Alert Behavior During Therapy: WFL for tasks assessed/performed Cognition: No family/caregiver present to determine baseline             OT - Cognition Comments: pt oriented today recalling weightbearing precautions and pleasantly conversational. Does benefit from cues to optimize mobility progression intermittently. internally distracted                 Following commands: Intact Following commands impaired: Only follows one step commands consistently      Cueing   Cueing Techniques: Verbal cues  Exercises      Shoulder Instructions  General Comments      Pertinent Vitals/ Pain       Pain Assessment Pain Assessment: Faces Faces Pain Scale: Hurts even more Pain Location: R LE Pain Descriptors / Indicators: Aching, Grimacing, Discomfort, Moaning Pain Intervention(s): Limited activity within patient's tolerance, Monitored during session  Home Living                                          Prior Functioning/Environment              Frequency  Min 1X/week        Progress  Toward Goals  OT Goals(current goals can now be found in the care plan section)  Progress towards OT goals: Progressing toward goals  Acute Rehab OT Goals Patient Stated Goal: to get better prior to going home OT Goal Formulation: With patient Time For Goal Achievement: 05/05/23 Potential to Achieve Goals: Good ADL Goals Pt Will Perform Grooming: with modified independence;sitting;standing Pt Will Perform Upper Body Bathing: with modified independence;sitting;standing Pt Will Perform Lower Body Bathing: with modified independence;sit to/from stand Pt Will Transfer to Toilet: with modified independence;ambulating Pt Will Perform Toileting - Clothing Manipulation and hygiene: with modified independence;sit to/from stand Pt Will Perform Tub/Shower Transfer: with modified independence;ambulating;rolling walker  Plan      Co-evaluation                 AM-PAC OT "6 Clicks" Daily Activity     Outcome Measure   Help from another person eating meals?: None Help from another person taking care of personal grooming?: A Little Help from another person toileting, which includes using toliet, bedpan, or urinal?: A Little Help from another person bathing (including washing, rinsing, drying)?: A Lot Help from another person to put on and taking off regular upper body clothing?: A Little Help from another person to put on and taking off regular lower body clothing?: A Little 6 Click Score: 18    End of Session Equipment Utilized During Treatment: Gait belt;Rolling walker (2 wheels)  OT Visit Diagnosis: Unsteadiness on feet (R26.81);Other abnormalities of gait and mobility (R26.89);Repeated falls (R29.6);Muscle weakness (generalized) (M62.81);Pain Pain - Right/Left: Right Pain - part of body: Knee   Activity Tolerance Patient limited by pain   Patient Left in bed;with call bell/phone within reach;with bed alarm set   Nurse Communication Mobility status        Time: 1914-7829 OT  Time Calculation (min): 25 min  Charges: OT General Charges $OT Visit: 1 Visit OT Treatments $Self Care/Home Management : 23-37 mins  Tyler Deis, OTR/L [facility] Acute Rehabilitation Office: [phone]   Myrla Halsted 04/25/2023, 5:07 PM

## 2023-04-25 NOTE — Plan of Care (Signed)

## 2023-04-26 DIAGNOSIS — M00061 Staphylococcal arthritis, right knee: Secondary | ICD-10-CM | POA: Diagnosis not present

## 2023-04-26 LAB — BASIC METABOLIC PANEL
Anion gap: 5 (ref 5–15)
BUN: 10 mg/dL (ref 6–20)
CO2: 32 mmol/L (ref 22–32)
Calcium: 8.6 mg/dL — ABNORMAL LOW (ref 8.9–10.3)
Chloride: 99 mmol/L (ref 98–111)
Creatinine, Ser: 0.76 mg/dL (ref 0.61–1.24)
GFR, Estimated: >60 mL/min (ref >60)
Glucose, Bld: 117 mg/dL — ABNORMAL HIGH (ref 70–99)
Potassium: 4.6 mmol/L (ref 3.5–5.1)
Sodium: 136 mmol/L (ref 135–145)

## 2023-04-26 LAB — CBC WITH DIFFERENTIAL/PLATELET
Abs Immature Granulocytes: 0.06 10*3/uL (ref 0.00–0.07)
Basophils Absolute: 0.1 10*3/uL (ref 0.0–0.1)
Basophils Relative: 1 %
Eosinophils Absolute: 0.2 10*3/uL (ref 0.0–0.5)
Eosinophils Relative: 3 %
HCT: 34.8 % — ABNORMAL LOW (ref 39.0–52.0)
Hemoglobin: 11.4 g/dL — ABNORMAL LOW (ref 13.0–17.0)
Immature Granulocytes: 1 %
Lymphocytes Relative: 7 %
Lymphs Abs: 0.6 10*3/uL — ABNORMAL LOW (ref 0.7–4.0)
MCH: 29.5 pg (ref 26.0–34.0)
MCHC: 32.8 g/dL (ref 30.0–36.0)
MCV: 90.2 fL (ref 80.0–100.0)
Monocytes Absolute: 0.6 10*3/uL (ref 0.1–1.0)
Monocytes Relative: 7 %
Neutro Abs: 6.6 10*3/uL (ref 1.7–7.7)
Neutrophils Relative %: 81 %
Platelets: 192 10*3/uL (ref 150–400)
RBC: 3.86 MIL/uL — ABNORMAL LOW (ref 4.22–5.81)
RDW: 14.9 % (ref 11.5–15.5)
WBC: 8.1 10*3/uL (ref 4.0–10.5)
nRBC: 0 % (ref 0.0–0.2)

## 2023-04-26 LAB — SEDIMENTATION RATE: Sed Rate: 27 mm/h — ABNORMAL HIGH (ref 0–16)

## 2023-04-26 LAB — C-REACTIVE PROTEIN: CRP: 3.6 mg/dL — ABNORMAL HIGH (ref <1.0)

## 2023-04-26 LAB — CK: Total CK: 19 U/L — ABNORMAL LOW (ref 49–397)

## 2023-04-26 MED ORDER — SODIUM CHLORIDE 0.9 % IV SOLN
8.0000 mg/kg | Freq: Every day | INTRAVENOUS | Status: AC
  Administered 2023-04-26 – 2023-05-02 (×7): 800 mg via INTRAVENOUS
  Filled 2023-04-26 (×9): qty 16

## 2023-04-26 NOTE — Plan of Care (Signed)
  Problem: Education: Goal: Knowledge of General Education information will improve Description: Including pain rating scale, medication(s)/side effects and non-pharmacologic comfort measures Outcome: Progressing   Problem: Clinical Measurements: Goal: Will remain free from infection Outcome: Progressing   Problem: Pain Managment: Goal: General experience of comfort will improve and/or be controlled Outcome: Progressing

## 2023-04-26 NOTE — Progress Notes (Signed)
 Mobility Specialist Progress Note:    04/26/23 1100  Mobility  Activity Ambulated with assistance in hallway  Level of Assistance Contact guard assist, steadying assist  Assistive Device Front wheel walker  Distance Ambulated (ft) 200 ft  RLE Weight Bearing Per Provider Order WBAT  Activity Response Tolerated well  Mobility Referral Yes  Mobility visit 1 Mobility  Mobility Specialist Start Time (ACUTE ONLY) 1058  Mobility Specialist Stop Time (ACUTE ONLY) 1132  Mobility Specialist Time Calculation (min) (ACUTE ONLY) 34 min   Pt received in bed and agreeable. C/o 9/10 pain but motivated for mobility. Asymptomatic throughout ambulation. Pt left in bed with call bell and all needs met.  D'Vante Earlene Plater Mobility Specialist Please contact via Special educational needs teacher or Rehab office at [phone]

## 2023-04-26 NOTE — Progress Notes (Signed)
 PROGRESS NOTE    [name]  ZOX:[id number] DOB: 26-Feb-1966 DOA: 04/16/2023 PCP: Candi Leash, MD     Brief Narrative:  Jonathan Ibarra is a 58 yr old male with PMH significant for Liver cirrhosis, s/p TIPS procedure, Essential hypertension, polysubstance abuse, Atrial Fibrillation , recent right plateau fracture requiring ORIF on 12/13/22.  Patient was hospitalized from 12/27/22 -02/04/2023 with purulent discharge from the right knee, thought to have post operative septic arthritis. Patient was managed with IV ertapenem. Patient was evaluated by infectious disease and patient was discharged on omadacycline. Patient was advised to follow-up with orthopedics and infectious diseases. Patient had undergone irrigation and debridement of right knee three times as an outpatient. Recently has not followed up with orthopedics, now presented with worsening pain and swelling of right knee associated with purulent drainage. Right knee x-ray showed  large joint effusion.  Soft tissue edema.  Orthopedics and infectious disease consulted.  Blood cultures positive for MRSA.  He underwent I&D and hardware removal of his right knee on 2/19.  He underwent TEE on 2/20 which was negative for endocarditis.  New events last 24 hours / Subjective: Continues to complain of pain, not relieved with Dilaudid.  Had 2 bowel movements yesterday, 1 this morning  Assessment & Plan:   Principal Problem:   Septic joint of right knee joint (HCC) Active Problems:   Thrombocytopenia (HCC)   Essential hypertension   Hepatic cirrhosis (HCC)   Polysubstance abuse (HCC)   S/P TIPS (transjugular intrahepatic portosystemic shunt)   Atrial fibrillation, chronic (HCC)   MRSA bacteremia   Effusion of right knee joint   Bacteremia   Septic arthritis of right knee, MRSA bacteremia -History of previous I&D as outpatient, completed treatment with IV or ertapenem and subsequently omadacycline -Orthopedic surgery consulted,  s/p hardware removal and I&D 2/19. Wound vac in place  -Blood cultures positive for MRSA -TEE 2/20 negative for endocarditis -Infectious disease signed off 2/21  -Poor candidate for PICC line -Continue daptomycin for 2 weeks, last day 05/03/2023. Consult ID on 3/4 to arrange follow up and outpatient dosing  Chronic A-fib -CHA2DS2-VASc 1  -Not on anticoagulation -Metoprolol, Cardizem -Cardiology signed off 2/20  Liver cirrhosis status post TIPS, thrombocytopenia -Lactulose  Polysubstance abuse -Counseling  Obesity -Estimated body mass index is 35.33 kg/m as calculated from the following:   Height as of this encounter: 6' 0.01" (1.829 m).   Weight as of this encounter: 118.2 kg.    DVT prophylaxis:  SCDs Start: 04/20/23 1253 Place and maintain sequential compression device Start: 04/16/23 1702  Code Status: Full code Family Communication: None at bedside Disposition Plan: Skilled nursing facility Status is: Inpatient Remains inpatient appropriate because: IV antibiotics until 05/03/2023  Antimicrobials:  Anti-infectives (From admission, onward)    Start     Dose/Rate Route Frequency Ordered Stop   04/26/23 1400  DAPTOmycin (CUBICIN) 800 mg in sodium chloride 0.9 % IVPB       Placed in "And" Linked Group   8 mg/kg  93.8 kg (Adjusted) 132 mL/hr over 30 Minutes Intravenous Daily 04/26/23 0843 05/04/23 1359   04/20/23 1109  vancomycin (VANCOCIN) powder  Status:  Discontinued          As needed 04/20/23 1109 04/20/23 1137   04/20/23 1109  tobramycin (NEBCIN) powder  Status:  Discontinued          As needed 04/20/23 1109 04/20/23 1137   04/18/23 1400  DAPTOmycin (CUBICIN) 800 mg in sodium chloride 0.9 % IVPB  Status:  Discontinued       Placed in "And" Linked Group   8 mg/kg  93.8 kg (Adjusted) 132 mL/hr over 30 Minutes Intravenous Daily 04/18/23 0754 04/26/23 0843   04/18/23 0500  piperacillin-tazobactam (ZOSYN) IVPB 3.375 g  Status:  Discontinued        3.375 g 12.5  mL/hr over 240 Minutes Intravenous Every 8 hours 04/17/23 1002 04/17/23 1026   04/17/23 2300  piperacillin-tazobactam (ZOSYN) IVPB 3.375 g  Status:  Discontinued       Placed in "Followed by" Linked Group   3.375 g 12.5 mL/hr over 240 Minutes Intravenous Every 8 hours 04/16/23 1609 04/17/23 0007   04/17/23 2100  DAPTOmycin (CUBICIN) 800 mg in sodium chloride 0.9 % IVPB  Status:  Discontinued        8 mg/kg  93.8 kg (Adjusted) 132 mL/hr over 30 Minutes Intravenous Daily 04/17/23 1249 04/18/23 0754   04/17/23 1930  vancomycin (VANCOCIN) IVPB 1000 mg/200 mL premix  Status:  Discontinued        1,000 mg 200 mL/hr over 60 Minutes Intravenous Every 12 hours 04/17/23 1016 04/18/23 0748   04/17/23 0730  vancomycin (VANCOREADY) IVPB 1250 mg/250 mL  Status:  Discontinued        1,250 mg 166.7 mL/hr over 90 Minutes Intravenous Every 12 hours 04/16/23 1846 04/17/23 1016   04/17/23 0500  vancomycin (VANCOREADY) IVPB 1250 mg/250 mL  Status:  Discontinued        1,250 mg 166.7 mL/hr over 90 Minutes Intravenous Every 12 hours 04/16/23 1628 04/16/23 1846   04/17/23 0100  ertapenem (INVANZ) 1 g in sodium chloride 0.9 % 100 mL IVPB        1 g 200 mL/hr over 30 Minutes Intravenous  Once 04/17/23 0008 04/17/23 0516   04/16/23 1615  vancomycin (VANCOREADY) IVPB 2000 mg/400 mL        2,000 mg 200 mL/hr over 120 Minutes Intravenous  Once 04/16/23 1609 04/16/23 2319   04/16/23 1615  piperacillin-tazobactam (ZOSYN) IVPB 3.375 g       Placed in "Followed by" Linked Group   3.375 g 100 mL/hr over 30 Minutes Intravenous  Once 04/16/23 1609 04/16/23 1717   04/16/23 1600  ceFAZolin (ANCEF) IVPB 1 g/50 mL premix        1 g 100 mL/hr over 30 Minutes Intravenous  Once 04/16/23 1554 04/16/23 1807        Objective: Vitals:   04/25/23 1834 04/25/23 2131 04/26/23 0349 04/26/23 0704  BP: 132/75 122/84 (!) 120/91 (!) 148/81  Pulse: 86 82 83 91  Resp:  18 18   Temp: (!) 97.4 F (36.3 C) 98.2 F (36.8 C) 98.3 F  (36.8 C) 97.7 F (36.5 C)  TempSrc: Oral Oral Oral Oral  SpO2: 96% 95% 95% 98%  Weight:      Height:        Intake/Output Summary (Last 24 hours) at 04/26/2023 1350 Last data filed at 04/26/2023 1000 Gross per 24 hour  Intake 460 ml  Output 880 ml  Net -420 ml   Filed Weights   04/16/23 1819  Weight: 118.2 kg    Examination:  General exam: Appears calm  Respiratory system: Respiratory effort normal. No respiratory distress. No conversational dyspnea.  Extremities: Right LE wound vac in place    Data Reviewed: I have personally reviewed following labs and imaging studies  CBC: Recent Labs  Lab 04/20/23 0320 04/21/23 0206 04/22/23 0301 04/26/23 0609  WBC 6.8 8.6 13.9* 8.1  NEUTROABS  --   --   --  6.6  HGB 14.2 12.2* 12.0* 11.4*  HCT 43.0 36.6* 36.4* 34.8*  MCV 89.4 88.4 89.9 90.2  PLT 184 239 314 192   Basic Metabolic Panel: Recent Labs  Lab 04/20/23 0320 04/21/23 0206 04/26/23 0609  NA 135 134* 136  K 3.6 4.3 4.6  CL 99 100 99  CO2 24 26 32  GLUCOSE 101* 99 117*  BUN 31* 23* 10  CREATININE 0.95 0.84 0.76  CALCIUM 9.4 8.8* 8.6*  MG 2.1  --   --   PHOS 3.1  --   --    GFR: Estimated Creatinine Clearance: 136.8 mL/min (by C-G formula based on SCr of 0.76 mg/dL). Liver Function Tests: No results for input(s): "AST", "ALT", "ALKPHOS", "BILITOT", "PROT", "ALBUMIN" in the last 168 hours.  No results for input(s): "LIPASE", "AMYLASE" in the last 168 hours. No results for input(s): "AMMONIA" in the last 168 hours.  Coagulation Profile: Recent Labs  Lab 04/20/23 1413  INR 1.4*   Cardiac Enzymes: Recent Labs  Lab 04/26/23 0609  CKTOTAL 19*   BNP (last 3 results) No results for input(s): "PROBNP" in the last 8760 hours. HbA1C: No results for input(s): "HGBA1C" in the last 72 hours. CBG: No results for input(s): "GLUCAP" in the last 168 hours.  Lipid Profile: No results for input(s): "CHOL", "HDL", "LDLCALC", "TRIG", "CHOLHDL", "LDLDIRECT" in  the last 72 hours. Thyroid Function Tests: No results for input(s): "TSH", "T4TOTAL", "FREET4", "T3FREE", "THYROIDAB" in the last 72 hours. Anemia Panel: No results for input(s): "VITAMINB12", "FOLATE", "FERRITIN", "TIBC", "IRON", "RETICCTPCT" in the last 72 hours. Sepsis Labs: No results for input(s): "PROCALCITON", "LATICACIDVEN" in the last 168 hours.   Recent Results (from the past 240 hours)  Culture, blood (Routine X 2) w Reflex to ID Panel     Status: Abnormal   Collection Time: 04/16/23  3:08 PM   Specimen: BLOOD LEFT ARM  Result Value Ref Range Status   Specimen Description BLOOD LEFT ARM  Final   Special Requests   Final    BOTTLES DRAWN AEROBIC AND ANAEROBIC Blood Culture results may not be optimal due to an inadequate volume of blood received in culture bottles   Culture  Setup Time   Final    GRAM POSITIVE COCCI IN CLUSTERS IN BOTH AEROBIC AND ANAEROBIC BOTTLES CRITICAL VALUE NOTED.  VALUE IS CONSISTENT WITH PREVIOUSLY REPORTED AND CALLED VALUE.    Culture (A)  Final    STAPHYLOCOCCUS AUREUS SUSCEPTIBILITIES PERFORMED ON PREVIOUS CULTURE WITHIN THE LAST 5 DAYS. Performed at [facility], [address], [place], [place] [address]    Report Status 04/19/2023 FINAL  Final  Culture, blood (Routine X 2) w Reflex to ID Panel     Status: None   Collection Time: 04/18/23  7:06 AM   Specimen: BLOOD RIGHT HAND  Result Value Ref Range Status   Specimen Description BLOOD RIGHT HAND  Final   Special Requests   Final    BOTTLES DRAWN AEROBIC AND ANAEROBIC Blood Culture results may not be optimal due to an inadequate volume of blood received in culture bottles   Culture   Final    NO GROWTH 5 DAYS Performed at [facility], [address], [place], [place] [address]    Report Status 04/23/2023 FINAL  Final  Culture, blood (Routine X 2) w Reflex to ID Panel     Status: None   Collection Time: 04/18/23  7:08  AM   Specimen: BLOOD  Result Value Ref Range Status    Specimen Description BLOOD RIGHT ANTECUBITAL  Final   Special Requests   Final    BOTTLES DRAWN AEROBIC AND ANAEROBIC Blood Culture results may not be optimal due to an inadequate volume of blood received in culture bottles   Culture   Final    NO GROWTH 5 DAYS Performed at [facility], [address], [place], [place] [address]    Report Status 04/23/2023 FINAL  Final  Surgical pcr screen     Status: None   Collection Time: 04/20/23  7:36 AM   Specimen: Nasal Mucosa; Nasal Swab  Result Value Ref Range Status   MRSA, PCR NEGATIVE NEGATIVE Final   Staphylococcus aureus NEGATIVE NEGATIVE Final    Comment: (NOTE) The Xpert SA Assay (FDA approved for NASAL specimens in patients 48 years of age and older), is one component of a comprehensive surveillance program. It is not intended to diagnose infection nor to guide or monitor treatment. Performed at [facility], [address], [place], [place] [address]   Aerobic/Anaerobic Culture w Gram Stain (surgical/deep wound)     Status: None   Collection Time: 04/20/23 11:00 AM   Specimen: PATH Cytology Misc. fluid; Body Fluid  Result Value Ref Range Status   Specimen Description FLUID  Final   Special Requests NONE  Final   Gram Stain   Final    ABUNDANT WBC PRESENT, PREDOMINANTLY PMN RARE GRAM POSITIVE COCCI IN PAIRS    Culture   Final    RARE METHICILLIN RESISTANT STAPHYLOCOCCUS AUREUS NO ANAEROBES ISOLATED Performed at [facility], [address], [place], [place] [address]    Report Status 04/25/2023 FINAL  Final   Organism ID, Bacteria METHICILLIN RESISTANT STAPHYLOCOCCUS AUREUS  Final      Susceptibility   Methicillin resistant staphylococcus aureus - MIC*    CIPROFLOXACIN >=8 RESISTANT Resistant     ERYTHROMYCIN >=8 RESISTANT Resistant     GENTAMICIN <=0.5 SENSITIVE Sensitive     OXACILLIN >=4 RESISTANT Resistant     TETRACYCLINE >=16 RESISTANT Resistant     VANCOMYCIN 1 SENSITIVE Sensitive      TRIMETH/SULFA >=320 RESISTANT Resistant     CLINDAMYCIN <=0.25 SENSITIVE Sensitive     RIFAMPIN <=0.5 SENSITIVE Sensitive     Inducible Clindamycin NEGATIVE Sensitive     LINEZOLID 2 SENSITIVE Sensitive     * RARE METHICILLIN RESISTANT STAPHYLOCOCCUS AUREUS  Aerobic/Anaerobic Culture w Gram Stain (surgical/deep wound)     Status: None   Collection Time: 04/20/23 11:00 AM   Specimen: PATH Soft tissue  Result Value Ref Range Status   Specimen Description FLUID  Final   Special Requests NONE  Final   Gram Stain   Final    RARE WBC PRESENT, PREDOMINANTLY MONONUCLEAR NO ORGANISMS SEEN    Culture   Final    RARE STAPHYLOCOCCUS AUREUS SUSCEPTIBILITIES PERFORMED ON PREVIOUS CULTURE WITHIN THE LAST 5 DAYS. NO ANAEROBES ISOLATED Performed at [facility], [address] [place], [place] [address]    Report Status 04/25/2023 FINAL  Final  Aerobic/Anaerobic Culture w Gram Stain (surgical/deep wound)     Status: None   Collection Time: 04/20/23 11:06 AM   Specimen: PATH Cytology Misc. fluid; Body Fluid  Result Value Ref Range Status   Specimen Description FLUID  Final   Special Requests NONE  Final   Gram Stain   Final    ABUNDANT WBC  PRESENT, PREDOMINANTLY PMN NO ORGANISMS SEEN    Culture   Final    RARE METHICILLIN RESISTANT STAPHYLOCOCCUS AUREUS NO ANAEROBES ISOLATED Performed at [facility], [address] [place], [place] [address]    Report Status 04/25/2023 FINAL  Final   Organism ID, Bacteria METHICILLIN RESISTANT STAPHYLOCOCCUS AUREUS  Final      Susceptibility   Methicillin resistant staphylococcus aureus - MIC*    CIPROFLOXACIN >=8 RESISTANT Resistant     ERYTHROMYCIN >=8 RESISTANT Resistant     GENTAMICIN <=0.5 SENSITIVE Sensitive     OXACILLIN >=4 RESISTANT Resistant     TETRACYCLINE >=16 RESISTANT Resistant     VANCOMYCIN 1 SENSITIVE Sensitive     TRIMETH/SULFA 160 RESISTANT Resistant     CLINDAMYCIN <=0.25 SENSITIVE Sensitive     RIFAMPIN <=0.5  SENSITIVE Sensitive     Inducible Clindamycin NEGATIVE Sensitive     LINEZOLID 2 SENSITIVE Sensitive     * RARE METHICILLIN RESISTANT STAPHYLOCOCCUS AUREUS      Radiology Studies: No results found.     Scheduled Meds:  diltiazem  180 mg Oral Daily   docusate sodium  100 mg Oral BID   folic acid  1 mg Oral Daily   lactulose  30 g Oral TID   metoprolol succinate  50 mg Oral Daily   polyethylene glycol  17 g Oral Daily   senna  1 tablet Oral BID   sodium chloride flush  3-10 mL Intravenous Q12H   thiamine  100 mg Oral Daily   Continuous Infusions:  DAPTOmycin       LOS: 10 days   Time spent: 25 minutes   Noralee Stain, DO [facility] 04/26/2023, 1:50 PM   Available via Epic secure chat 7am-7pm After these hours, please refer to coverage provider listed on [email or web address]

## 2023-04-27 DIAGNOSIS — M00061 Staphylococcal arthritis, right knee: Secondary | ICD-10-CM | POA: Diagnosis not present

## 2023-04-27 NOTE — Progress Notes (Signed)
 PT Cancellation Note  Patient Details Name: Jonathan Ibarra MRN: [id number] DOB: [birth year]   Cancelled Treatment:    Reason Eval/Treat Not Completed: (P) Pain limiting ability to participate (Pt refusing due to c/o "10/10" R knee pain despite premedication ~1 hour prior.) Of note, pt did work with mobility specialist well earlier in the day. Will continue efforts next date per PT plan of care as schedule permits.    Dorathy Kinsman Elenore Wanninger 04/27/2023, 6:45 PM

## 2023-04-27 NOTE — Progress Notes (Signed)
 [facility] PROGRESS NOTE    Progress Note  Jonathan Ibarra  ZOX:[id number] DOB: 12-07-1965 DOA: 04/16/2023 PCP: Candi Leash, MD     Brief Narrative:   Jonathan Ibarra is an 58 y.o. male past medical history of cirrhosis, status post procedure, essential hypertension, polysubstance abuse, SVT paroxysmal atrial fibrillation not a candidate for anticoagulation, with a recent plateau fracture requiring ORIF in October 2024, discharged on 02/04/2023 for purulent discharge from the right knee, it was believe that he had postoperative septic arthritis patient was managed with IV Invanz seen by ID and discharged home on omadacycline, patient has undergone I&D of the right knee 3 times as an outpatient, did not follow-up with orthopedic surgery on his last appointment presenting to the ED with worsening swelling and pain with purulent discharge right knee x-ray showed a right large effusion soft tissue edema orthopedic surgery and infectious disease were consulted, blood cultures were positive for MRSA, underwent I&D with hardware removal on 04/20/2023 underwent TEE on 04/21/2023 was negative for endocarditis  Assessment/Plan:   Septic joint of right knee joint and hardware/MRSA bacteremia: Stray of multiple I&D's and outpatient will complete his treatment as an outpatient. Came back to the ED orthopedic surgery was consulted he status post hardware removal and I&D on 04/20/2023.  Surveillance blood culture on 04/18/2023 remain negative. Wound VAC was placed. Blood cultures grew MRSA, TEE was negative for endocarditis. Infectious disease was consulted TEE was negative for endocarditis. He is a poor candidate for PICC line infectious ease recommended to continue IV daptomycin for 3 weeks, end date of treatment 05/03/2023.  Then he will need to go to SNF. 5 ID on the day of discharge for follow-up appointment.  Chronic atrial fibrillation: Not a candidate for anticoagulation. Continue  metoprolol and Cardizem.  Liver cirrhosis status post TIPS/thrombocytopenia/: Continue lactulose.  Polysubstance abuse: Continue counseling.  Obesity: Noted.  Essential hypertension Currently on no antihypertensive medication, relatively well-controlled.  Polysubstance abuse (HCC) Noted.    DVT prophylaxis: lovenox Family Communication:none Status is: Inpatient Remains inpatient appropriate because: Septic knee    Code Status:     Code Status Orders  (From admission, onward)           Start     Ordered   04/16/23 1554  Full code  Continuous       Question:  By:  Answer:  Consent: discussion documented in EHR   04/16/23 1555           Code Status History     Date Active Date Inactive Code Status Order ID Comments User Context   12/28/2022 0822 02/04/2023 2234 Full Code [id number]  Lajoyce Corners, NP ED   12/13/2022 0336 12/17/2022 2206 Full Code [id number]  Dolly Rias, MD ED   10/01/2022 0408 10/08/2022 1755 Full Code [id number]  Carollee Herter, DO ED   10/01/2022 0345 10/01/2022 0408 Full Code [id number]  Carollee Herter, DO ED   04/23/2017 0001 04/28/2017 2028 Full Code [id number]  Hillary Bow, DO ED   02/04/2015 1730 02/07/2015 1519 Full Code [id number]  Alison Murray, MD Inpatient         IV Access:   Peripheral IV   Procedures and diagnostic studies:   No results found.   Medical Consultants:   None.   Subjective:    Jonathan Ibarra does not want to go SNF  Objective:    Vitals:   04/26/23 1507 04/26/23 2147 04/27/23 0551 04/27/23 0807  BP: 124/80 (!) 138/91  133/72 128/77  Pulse: 75 99 77 96  Resp:  16 16 18   Temp: 97.8 F (36.6 C) 97.9 F (36.6 C) 97.9 F (36.6 C) 98 F (36.7 C)  TempSrc: Oral  Oral Oral  SpO2: 98% 99% 99% 99%  Weight:      Height:       SpO2: 99 % O2 Flow Rate (L/min): 0 L/min   Intake/Output Summary (Last 24 hours) at 04/27/2023 1009 Last data filed at 04/26/2023 1850 Gross per 24 hour  Intake 240 ml   Output 200 ml  Net 40 ml   Filed Weights   04/16/23 1819  Weight: 118.2 kg    Exam: General exam: In no acute distress. Respiratory system: Good air movement and clear to auscultation. Cardiovascular system: S1 & S2 heard, RRR. No JVD.  Gastrointestinal system: Abdomen is nondistended, soft and nontender.  Extremities: No pedal edema. Skin: No rashes, lesions or ulcers Psychiatry: Judgement and insight appear normal. Mood & affect appropriate.    Data Reviewed:    Labs: Basic Metabolic Panel: Recent Labs  Lab 04/21/23 0206 04/26/23 0609  NA 134* 136  K 4.3 4.6  CL 100 99  CO2 26 32  GLUCOSE 99 117*  BUN 23* 10  CREATININE 0.84 0.76  CALCIUM 8.8* 8.6*   GFR Estimated Creatinine Clearance: 136.8 mL/min (by C-G formula based on SCr of 0.76 mg/dL). Liver Function Tests: No results for input(s): "AST", "ALT", "ALKPHOS", "BILITOT", "PROT", "ALBUMIN" in the last 168 hours. No results for input(s): "LIPASE", "AMYLASE" in the last 168 hours. No results for input(s): "AMMONIA" in the last 168 hours. Coagulation profile Recent Labs  Lab 04/20/23 1413  INR 1.4*   COVID-19 Labs  Recent Labs    04/26/23 0609  CRP 3.6*    Lab Results  Component Value Date   SARSCOV2NAA NEGATIVE 09/30/2022    CBC: Recent Labs  Lab 04/21/23 0206 04/22/23 0301 04/26/23 0609  WBC 8.6 13.9* 8.1  NEUTROABS  --   --  6.6  HGB 12.2* 12.0* 11.4*  HCT 36.6* 36.4* 34.8*  MCV 88.4 89.9 90.2  PLT 239 314 192   Cardiac Enzymes: Recent Labs  Lab 04/26/23 0609  CKTOTAL 19*   BNP (last 3 results) No results for input(s): "PROBNP" in the last 8760 hours. CBG: No results for input(s): "GLUCAP" in the last 168 hours. D-Dimer: No results for input(s): "DDIMER" in the last 72 hours. Hgb A1c: No results for input(s): "HGBA1C" in the last 72 hours. Lipid Profile: No results for input(s): "CHOL", "HDL", "LDLCALC", "TRIG", "CHOLHDL", "LDLDIRECT" in the last 72 hours. Thyroid  function studies: No results for input(s): "TSH", "T4TOTAL", "T3FREE", "THYROIDAB" in the last 72 hours.  Invalid input(s): "FREET3" Anemia work up: No results for input(s): "VITAMINB12", "FOLATE", "FERRITIN", "TIBC", "IRON", "RETICCTPCT" in the last 72 hours. Sepsis Labs: Recent Labs  Lab 04/21/23 0206 04/22/23 0301 04/26/23 0609  WBC 8.6 13.9* 8.1   Microbiology Recent Results (from the past 240 hours)  Culture, blood (Routine X 2) w Reflex to ID Panel     Status: None   Collection Time: 04/18/23  7:06 AM   Specimen: BLOOD RIGHT HAND  Result Value Ref Range Status   Specimen Description BLOOD RIGHT HAND  Final   Special Requests   Final    BOTTLES DRAWN AEROBIC AND ANAEROBIC Blood Culture results may not be optimal due to an inadequate volume of blood received in culture bottles   Culture   Final  NO GROWTH 5 DAYS Performed at [facility], [address], [place], [place] [address]    Report Status 04/23/2023 FINAL  Final  Culture, blood (Routine X 2) w Reflex to ID Panel     Status: None   Collection Time: 04/18/23  7:08 AM   Specimen: BLOOD  Result Value Ref Range Status   Specimen Description BLOOD RIGHT ANTECUBITAL  Final   Special Requests   Final    BOTTLES DRAWN AEROBIC AND ANAEROBIC Blood Culture results may not be optimal due to an inadequate volume of blood received in culture bottles   Culture   Final    NO GROWTH 5 DAYS Performed at [facility], [address], [place], [place] [address]    Report Status 04/23/2023 FINAL  Final  Surgical pcr screen     Status: None   Collection Time: 04/20/23  7:36 AM   Specimen: Nasal Mucosa; Nasal Swab  Result Value Ref Range Status   MRSA, PCR NEGATIVE NEGATIVE Final   Staphylococcus aureus NEGATIVE NEGATIVE Final    Comment: (NOTE) The Xpert SA Assay (FDA approved for NASAL specimens in patients 80 years of age and older), is one component of a comprehensive surveillance program. It is not intended to  diagnose infection nor to guide or monitor treatment. Performed at [facility], [address], [place], [place] [address]   Aerobic/Anaerobic Culture w Gram Stain (surgical/deep wound)     Status: None   Collection Time: 04/20/23 11:00 AM   Specimen: PATH Cytology Misc. fluid; Body Fluid  Result Value Ref Range Status   Specimen Description FLUID  Final   Special Requests NONE  Final   Gram Stain   Final    ABUNDANT WBC PRESENT, PREDOMINANTLY PMN RARE GRAM POSITIVE COCCI IN PAIRS    Culture   Final    RARE METHICILLIN RESISTANT STAPHYLOCOCCUS AUREUS NO ANAEROBES ISOLATED Performed at [facility], [address], [place], [place] [address]    Report Status 04/25/2023 FINAL  Final   Organism ID, Bacteria METHICILLIN RESISTANT STAPHYLOCOCCUS AUREUS  Final      Susceptibility   Methicillin resistant staphylococcus aureus - MIC*    CIPROFLOXACIN >=8 RESISTANT Resistant     ERYTHROMYCIN >=8 RESISTANT Resistant     GENTAMICIN <=0.5 SENSITIVE Sensitive     OXACILLIN >=4 RESISTANT Resistant     TETRACYCLINE >=16 RESISTANT Resistant     VANCOMYCIN 1 SENSITIVE Sensitive     TRIMETH/SULFA >=320 RESISTANT Resistant     CLINDAMYCIN <=0.25 SENSITIVE Sensitive     RIFAMPIN <=0.5 SENSITIVE Sensitive     Inducible Clindamycin NEGATIVE Sensitive     LINEZOLID 2 SENSITIVE Sensitive     * RARE METHICILLIN RESISTANT STAPHYLOCOCCUS AUREUS  Aerobic/Anaerobic Culture w Gram Stain (surgical/deep wound)     Status: None   Collection Time: 04/20/23 11:00 AM   Specimen: PATH Soft tissue  Result Value Ref Range Status   Specimen Description FLUID  Final   Special Requests NONE  Final   Gram Stain   Final    RARE WBC PRESENT, PREDOMINANTLY MONONUCLEAR NO ORGANISMS SEEN    Culture   Final    RARE STAPHYLOCOCCUS AUREUS SUSCEPTIBILITIES PERFORMED ON PREVIOUS CULTURE WITHIN THE LAST 5 DAYS. NO ANAEROBES ISOLATED Performed at [facility], [address] [place], [place]  [address]    Report Status 04/25/2023 FINAL  Final  Aerobic/Anaerobic Culture w Gram Stain (surgical/deep wound)  Status: None   Collection Time: 04/20/23 11:06 AM   Specimen: PATH Cytology Misc. fluid; Body Fluid  Result Value Ref Range Status   Specimen Description FLUID  Final   Special Requests NONE  Final   Gram Stain   Final    ABUNDANT WBC PRESENT, PREDOMINANTLY PMN NO ORGANISMS SEEN    Culture   Final    RARE METHICILLIN RESISTANT STAPHYLOCOCCUS AUREUS NO ANAEROBES ISOLATED Performed at [facility], [address] [place], [place] [address]    Report Status 04/25/2023 FINAL  Final   Organism ID, Bacteria METHICILLIN RESISTANT STAPHYLOCOCCUS AUREUS  Final      Susceptibility   Methicillin resistant staphylococcus aureus - MIC*    CIPROFLOXACIN >=8 RESISTANT Resistant     ERYTHROMYCIN >=8 RESISTANT Resistant     GENTAMICIN <=0.5 SENSITIVE Sensitive     OXACILLIN >=4 RESISTANT Resistant     TETRACYCLINE >=16 RESISTANT Resistant     VANCOMYCIN 1 SENSITIVE Sensitive     TRIMETH/SULFA 160 RESISTANT Resistant     CLINDAMYCIN <=0.25 SENSITIVE Sensitive     RIFAMPIN <=0.5 SENSITIVE Sensitive     Inducible Clindamycin NEGATIVE Sensitive     LINEZOLID 2 SENSITIVE Sensitive     * RARE METHICILLIN RESISTANT STAPHYLOCOCCUS AUREUS     Medications:    diltiazem  180 mg Oral Daily   docusate sodium  100 mg Oral BID   folic acid  1 mg Oral Daily   lactulose  30 g Oral TID   metoprolol succinate  50 mg Oral Daily   polyethylene glycol  17 g Oral Daily   senna  1 tablet Oral BID   sodium chloride flush  3-10 mL Intravenous Q12H   thiamine  100 mg Oral Daily   Continuous Infusions:  DAPTOmycin 800 mg (04/26/23 1700)      LOS: 11 days   Marinda Elk  [facility]  04/27/2023, 10:09 AM

## 2023-04-27 NOTE — Plan of Care (Signed)
  Problem: Education: Goal: Knowledge of General Education information will improve Description: Including pain rating scale, medication(s)/side effects and non-pharmacologic comfort measures Outcome: Progressing   Problem: Clinical Measurements: Goal: Will remain free from infection Outcome: Progressing   Problem: Pain Managment: Goal: General experience of comfort will improve and/or be controlled Outcome: Progressing

## 2023-04-27 NOTE — Progress Notes (Signed)
 Mobility Specialist Progress Note:    04/27/23 1200  Mobility  Activity Ambulated with assistance in hallway  Level of Assistance Contact guard assist, steadying assist  Assistive Device Front wheel walker  Distance Ambulated (ft) 200 ft  RLE Weight Bearing Per Provider Order WBAT  Activity Response Tolerated well  Mobility Referral Yes  Mobility visit 1 Mobility  Mobility Specialist Start Time (ACUTE ONLY) 1015  Mobility Specialist Stop Time (ACUTE ONLY) 1044  Mobility Specialist Time Calculation (min) (ACUTE ONLY) 29 min   Pt received in bed and agreeable. Only required minG throughout. C/o RLE pain throughout, RN aware. Pt left on EOB with call bell and all needs met.  D'Vante Earlene Plater Mobility Specialist Please contact via Special educational needs teacher or Rehab office at [phone]

## 2023-04-28 DIAGNOSIS — M00061 Staphylococcal arthritis, right knee: Secondary | ICD-10-CM | POA: Diagnosis not present

## 2023-04-28 NOTE — Progress Notes (Signed)
    Durable Medical Equipment  (From admission, onward)           Start     Ordered   04/28/23 1601  For home use only DME Tub bench  Once       Comments: Tub/shower bench   04/28/23 1603   04/28/23 1557  For home use only DME Walker rolling  Once       Question Answer Comment  Walker: With 5 Inch Wheels   Patient needs a walker to treat with the following condition Gait instability      04/28/23 1603

## 2023-04-28 NOTE — Plan of Care (Signed)

## 2023-04-28 NOTE — Progress Notes (Signed)
 [facility] PROGRESS NOTE    Progress Note  Jonathan Ibarra  NWG:[id number] DOB: 1966-02-12 DOA: 04/16/2023 PCP: Candi Leash, MD     Brief Narrative:   Jonathan Ibarra is an 58 y.o. male past medical history of cirrhosis, status post procedure, essential hypertension, polysubstance abuse, SVT paroxysmal atrial fibrillation not a candidate for anticoagulation, with a recent plateau fracture requiring ORIF in October 2024, discharged on 02/04/2023 for purulent discharge from the right knee, it was believe that he had postoperative septic arthritis patient was managed with IV Invanz seen by ID and discharged home on omadacycline, patient has undergone I&D of the right knee 3 times as an outpatient, did not follow-up with orthopedic surgery on his last appointment presenting to the ED with worsening swelling and pain with purulent discharge right knee x-ray showed a right large effusion soft tissue edema orthopedic surgery and infectious disease were consulted, blood cultures were positive for MRSA, underwent I&D with hardware removal on 04/20/2023 underwent TEE on 04/21/2023 was negative for endocarditis  Assessment/Plan:   Septic joint of right knee joint and hardware/MRSA bacteremia: History of multiple I&D's and outpatient will complete his treatment as an outpatient. Came back to the ED orthopedic surgery was consulted he status post hardware removal and I&D on 04/20/2023.   Wound VAC was placed. Blood cultures grew MRSA, TEE was negative for endocarditis. Infectious disease was consulted ,Surveillance blood culture on 04/18/2023 remain negative. He is a poor candidate for PICC line, infectious disease recommended to continue IV daptomycin for 3 weeks, end date of treatment 05/03/2023.   Then he will need to go to SNF. Call ID on the day of discharge for follow-up appointment.  Chronic atrial fibrillation: Not a candidate for anticoagulation. Continue metoprolol and  Cardizem.  Liver cirrhosis status post TIPS/thrombocytopenia/: Continue lactulose.  Polysubstance abuse: Continue counseling.  Normocytic anemia: No signs of overt blood loss. Hemoglobin is relatively stable.  Obesity: Noted.  Essential hypertension Currently on no antihypertensive medication, relatively well-controlled.  Polysubstance abuse (HCC) Noted.    DVT prophylaxis: lovenox Family Communication:none Status is: Inpatient Remains inpatient appropriate because: Septic knee    Code Status:     Code Status Orders  (From admission, onward)           Start     Ordered   04/16/23 1554  Full code  Continuous       Question:  By:  Answer:  Consent: discussion documented in EHR   04/16/23 1555           Code Status History     Date Active Date Inactive Code Status Order ID Comments User Context   12/28/2022 0822 02/04/2023 2234 Full Code [id number]  Lajoyce Corners, NP ED   12/13/2022 0336 12/17/2022 2206 Full Code [id number]  Dolly Rias, MD ED   10/01/2022 0408 10/08/2022 1755 Full Code [id number]  Carollee Herter, DO ED   10/01/2022 0345 10/01/2022 0408 Full Code [id number]  Carollee Herter, DO ED   04/23/2017 0001 04/28/2017 2028 Full Code [id number]  Hillary Bow, DO ED   02/04/2015 1730 02/07/2015 1519 Full Code [id number]  Alison Murray, MD Inpatient         IV Access:   Peripheral IV   Procedures and diagnostic studies:   No results found.   Medical Consultants:   None.   Subjective:    Jonathan Ibarra does not want to go SNF  Objective:    Vitals:   04/27/23 1553 04/27/23  2142 04/28/23 0333 04/28/23 0807  BP: 138/83 126/81 125/87 115/76  Pulse: 66 (!) 102 67 93  Resp: 16 18 20 19   Temp: 97.6 F (36.4 C) (!) 97.4 F (36.3 C) 97.9 F (36.6 C) 98 F (36.7 C)  TempSrc: Oral Oral Oral   SpO2: 95% 99% 100% 96%  Weight:      Height:       SpO2: 96 % O2 Flow Rate (L/min): 0 L/min   Intake/Output Summary (Last 24 hours) at  04/28/2023 0914 Last data filed at 04/28/2023 0644 Gross per 24 hour  Intake 132 ml  Output 170 ml  Net -38 ml   Filed Weights   04/16/23 1819  Weight: 118.2 kg    Exam: General exam: In no acute distress. Respiratory system: Good air movement and clear to auscultation. Cardiovascular system: S1 & S2 heard, RRR. No JVD.  Gastrointestinal system: Abdomen is nondistended, soft and nontender.  Extremities: No pedal edema. Skin: No rashes, lesions or ulcers Psychiatry: Judgement and insight appear normal. Mood & affect appropriate.    Data Reviewed:    Labs: Basic Metabolic Panel: Recent Labs  Lab 04/26/23 0609  NA 136  K 4.6  CL 99  CO2 32  GLUCOSE 117*  BUN 10  CREATININE 0.76  CALCIUM 8.6*   GFR Estimated Creatinine Clearance: 136.8 mL/min (by C-G formula based on SCr of 0.76 mg/dL). Liver Function Tests: No results for input(s): "AST", "ALT", "ALKPHOS", "BILITOT", "PROT", "ALBUMIN" in the last 168 hours. No results for input(s): "LIPASE", "AMYLASE" in the last 168 hours. No results for input(s): "AMMONIA" in the last 168 hours. Coagulation profile No results for input(s): "INR", "PROTIME" in the last 168 hours.  COVID-19 Labs  Recent Labs    04/26/23 0609  CRP 3.6*    Lab Results  Component Value Date   SARSCOV2NAA NEGATIVE 09/30/2022    CBC: Recent Labs  Lab 04/22/23 0301 04/26/23 0609  WBC 13.9* 8.1  NEUTROABS  --  6.6  HGB 12.0* 11.4*  HCT 36.4* 34.8*  MCV 89.9 90.2  PLT 314 192   Cardiac Enzymes: Recent Labs  Lab 04/26/23 0609  CKTOTAL 19*   BNP (last 3 results) No results for input(s): "PROBNP" in the last 8760 hours. CBG: No results for input(s): "GLUCAP" in the last 168 hours. D-Dimer: No results for input(s): "DDIMER" in the last 72 hours. Hgb A1c: No results for input(s): "HGBA1C" in the last 72 hours. Lipid Profile: No results for input(s): "CHOL", "HDL", "LDLCALC", "TRIG", "CHOLHDL", "LDLDIRECT" in the last 72  hours. Thyroid function studies: No results for input(s): "TSH", "T4TOTAL", "T3FREE", "THYROIDAB" in the last 72 hours.  Invalid input(s): "FREET3" Anemia work up: No results for input(s): "VITAMINB12", "FOLATE", "FERRITIN", "TIBC", "IRON", "RETICCTPCT" in the last 72 hours. Sepsis Labs: Recent Labs  Lab 04/22/23 0301 04/26/23 0609  WBC 13.9* 8.1   Microbiology Recent Results (from the past 240 hours)  Surgical pcr screen     Status: None   Collection Time: 04/20/23  7:36 AM   Specimen: Nasal Mucosa; Nasal Swab  Result Value Ref Range Status   MRSA, PCR NEGATIVE NEGATIVE Final   Staphylococcus aureus NEGATIVE NEGATIVE Final    Comment: (NOTE) The Xpert SA Assay (FDA approved for NASAL specimens in patients 47 years of age and older), is one component of a comprehensive surveillance program. It is not intended to diagnose infection nor to guide or monitor treatment. Performed at [facility], [address], [place],  [place] [address]   Aerobic/Anaerobic Culture w Gram Stain (surgical/deep wound)     Status: None   Collection Time: 04/20/23 11:00 AM   Specimen: PATH Cytology Misc. fluid; Body Fluid  Result Value Ref Range Status   Specimen Description FLUID  Final   Special Requests NONE  Final   Gram Stain   Final    ABUNDANT WBC PRESENT, PREDOMINANTLY PMN RARE GRAM POSITIVE COCCI IN PAIRS    Culture   Final    RARE METHICILLIN RESISTANT STAPHYLOCOCCUS AUREUS NO ANAEROBES ISOLATED Performed at [facility], [address] [place], [place] [address]    Report Status 04/25/2023 FINAL  Final   Organism ID, Bacteria METHICILLIN RESISTANT STAPHYLOCOCCUS AUREUS  Final      Susceptibility   Methicillin resistant staphylococcus aureus - MIC*    CIPROFLOXACIN >=8 RESISTANT Resistant     ERYTHROMYCIN >=8 RESISTANT Resistant     GENTAMICIN <=0.5 SENSITIVE Sensitive     OXACILLIN >=4 RESISTANT Resistant     TETRACYCLINE >=16 RESISTANT Resistant     VANCOMYCIN 1  SENSITIVE Sensitive     TRIMETH/SULFA >=320 RESISTANT Resistant     CLINDAMYCIN <=0.25 SENSITIVE Sensitive     RIFAMPIN <=0.5 SENSITIVE Sensitive     Inducible Clindamycin NEGATIVE Sensitive     LINEZOLID 2 SENSITIVE Sensitive     * RARE METHICILLIN RESISTANT STAPHYLOCOCCUS AUREUS  Aerobic/Anaerobic Culture w Gram Stain (surgical/deep wound)     Status: None   Collection Time: 04/20/23 11:00 AM   Specimen: PATH Soft tissue  Result Value Ref Range Status   Specimen Description FLUID  Final   Special Requests NONE  Final   Gram Stain   Final    RARE WBC PRESENT, PREDOMINANTLY MONONUCLEAR NO ORGANISMS SEEN    Culture   Final    RARE STAPHYLOCOCCUS AUREUS SUSCEPTIBILITIES PERFORMED ON PREVIOUS CULTURE WITHIN THE LAST 5 DAYS. NO ANAEROBES ISOLATED Performed at [facility], [address], [place], [place] [address]    Report Status 04/25/2023 FINAL  Final  Aerobic/Anaerobic Culture w Gram Stain (surgical/deep wound)     Status: None   Collection Time: 04/20/23 11:06 AM   Specimen: PATH Cytology Misc. fluid; Body Fluid  Result Value Ref Range Status   Specimen Description FLUID  Final   Special Requests NONE  Final   Gram Stain   Final    ABUNDANT WBC PRESENT, PREDOMINANTLY PMN NO ORGANISMS SEEN    Culture   Final    RARE METHICILLIN RESISTANT STAPHYLOCOCCUS AUREUS NO ANAEROBES ISOLATED Performed at [facility], [address], [place], [place] [address]    Report Status 04/25/2023 FINAL  Final   Organism ID, Bacteria METHICILLIN RESISTANT STAPHYLOCOCCUS AUREUS  Final      Susceptibility   Methicillin resistant staphylococcus aureus - MIC*    CIPROFLOXACIN >=8 RESISTANT Resistant     ERYTHROMYCIN >=8 RESISTANT Resistant     GENTAMICIN <=0.5 SENSITIVE Sensitive     OXACILLIN >=4 RESISTANT Resistant     TETRACYCLINE >=16 RESISTANT Resistant     VANCOMYCIN 1 SENSITIVE Sensitive     TRIMETH/SULFA 160 RESISTANT Resistant     CLINDAMYCIN <=0.25 SENSITIVE Sensitive      RIFAMPIN <=0.5 SENSITIVE Sensitive     Inducible Clindamycin NEGATIVE Sensitive     LINEZOLID 2 SENSITIVE Sensitive     * RARE METHICILLIN RESISTANT STAPHYLOCOCCUS AUREUS     Medications:    diltiazem  180 mg Oral Daily   docusate sodium  100 mg Oral BID   folic acid  1 mg Oral Daily   lactulose  30 g Oral TID   metoprolol succinate  50 mg Oral Daily   polyethylene glycol  17 g Oral Daily   senna  1 tablet Oral BID   sodium chloride flush  3-10 mL Intravenous Q12H   thiamine  100 mg Oral Daily   Continuous Infusions:  DAPTOmycin 800 mg (04/27/23 1500)      LOS: 12 days   Marinda Elk  [facility]  04/28/2023, 9:14 AM

## 2023-04-28 NOTE — TOC Progression Note (Signed)
 Transition of Care [facility]) - Progression Note    Patient Details  Name: Jonathan Ibarra MRN: [id number] Date of Birth: 1966-01-01  Transition of [facility]) CM/SW Contact  Epifanio Lesches, RN Phone Number: 04/28/2023, 3:55 PM  Clinical Narrative:    Pt agreeable to home health needs and DME needs. Referral made with Amy/ [facility] for home health PT services and accepted. Referral made with [employer] for DME, RW and shower bench. Equipment will be delivered to bedside prior to d/c.  [facility] team following for needs...  Expected Discharge Plan: Home w Home Health Services Barriers to Discharge: Continued Medical Work up  Expected Discharge Plan and Services   Discharge Planning Services: CM Consult   Living arrangements for the past 2 months: Single Family Home                 DME Arranged: Tub bench, Walker rolling DME Agency: [employer] Date DME Agency Contacted: 04/28/23 Time DME Agency Contacted: [phone] Representative spoke with at DME Agency: Ian Malkin HH Arranged: PT, OT HH Agency: [facility] Date [facility] Agency Contacted: 04/28/23 Time HH Agency Contacted: [phone]     Social Determinants of Health (SDOH) Interventions SDOH Screenings   Food Insecurity: Food Insecurity Present (04/17/2023)  Housing: Low Risk  (04/17/2023)  Transportation Needs: Unmet Transportation Needs (04/17/2023)  Utilities: Not At Risk (04/17/2023)  Financial Resource Strain: Medium Risk (01/07/2020)   Received from [facility], [facility]  Social Connections: Unknown (07/05/2021)   Received from [facility], [facility]  Stress: No Stress Concern Present (01/07/2020)   Received from [facility], [facility]  Tobacco Use: High Risk (04/21/2023)    Readmission Risk Interventions     No data to display

## 2023-04-28 NOTE — Progress Notes (Addendum)
 Physical Therapy Treatment Patient Details Name: Kaimana Lurz MRN: [id number] DOB: 17-Dec-1965 Today's Date: 04/28/2023   History of Present Illness Pt is a 58 yr old male who presented on 04/16/23 due to increase in edema and pain in r knee following a fall. Pt s/p 04/20/23 removal of hardware, I & D of R knee. Pt R LE WBAT with wound vac. PMH: liver cirrhosis, s/p TIPS, HTN, polysubstance abuse, afib, R plateau fx requiring ORIF on 12/13/22 and has had irrigation and debridement of R knee in OP setting    PT Comments  Patient resting in bed and with moderate encouragement agreeable to participate in therapy. Ace wrap on Rt LE bunched up and slide down leg, once pt sitting EOB this therapist re-wrapped ace for compression. Pt confirmed well fitting and denied wrap being too tight. Supervision for transfers and gait with RW, pt amb hallway distance of ~200' with supervision, cues for caution/safe pace. Pt initiated stair training today and completed 7 steps of 3-6" with bil hand rail and CGA. Cues required for first 2 stairs and pt maintained safe step pattern for remainder. EOS pt agreeable to remain OOB, educated on importance of remaining upright vs reclining flat to nap. Encouraged time OOB to prevent complications of hospital stay. Will continue to progress pt as able.    If plan is discharge home, [name];[name];Assist for transportation;Help with stairs or ramp for entrance   Can travel by private vehicle     Yes  Equipment Recommendations  Rolling walker (2 wheels);Gilmer Mor (pt wants a SPC, reports has  RW at home (unsure if this is accurate) RW is safer for mobility at this time)    Recommendations for Other Services       Precautions / Restrictions Precautions Precautions: Fall Precaution/Restrictions Comments: wound vac RLE Restrictions Weight Bearing Restrictions Per Provider  Order: Yes RLE Weight Bearing Per Provider Order: Weight bearing as tolerated     Mobility  Bed Mobility Overal bed mobility: Modified Independent Bed Mobility: Supine to Sit     Supine to sit: Modified independent (Device/Increase time)          Transfers Overall transfer level: Needs assistance Equipment used: Rolling walker (2 wheels) Transfers: Sit to/from Stand Sit to Stand: Supervision           General transfer comment: no cues needed, sup for safety    Ambulation/Gait Ambulation/Gait assistance: Supervision Gait Distance (Feet): 200 Feet Assistive device: Rolling walker (2 wheels), Straight cane Gait Pattern/deviations: Step-through pattern, Decreased step length - right, Decreased step length - left, Decreased weight shift to right, Trunk flexed Gait velocity: fair     General Gait Details: sup for safety with amb in hallway, no buckling at Rt LE. pt occasionally required cues for safe pace. pt trialed [facility] for ~5' with close CGA for safety. gait more antalgic.   Stairs Stairs: Yes Stairs assistance: Contact guard assist Stair Management: Two rails, Step to pattern, Forwards Number of Stairs: 7 General stair comments: pt ascend/descend with step to pattern, cues required on first bout for sequencing "up with Lt, down with RT" and pt maintained throughout. reliant on Bil UE support.   Wheelchair Mobility     Tilt Bed    Modified Rankin (Stroke Patients Only)       Balance Overall balance assessment: Needs assistance Sitting-balance support: Feet supported Sitting balance-Leahy Scale: Good     Standing balance support:  Bilateral upper extremity supported, During functional activity, Reliant on assistive device for balance Standing balance-Leahy Scale: Fair Standing balance comment: reliant on device/external support                            Communication Communication Communication: No apparent difficulties  Cognition Arousal:  Alert Behavior During Therapy: WFL for tasks assessed/performed   PT - Cognitive impairments: No apparent impairments                         Following commands: Intact Following commands impaired: Follows multi-step commands inconsistently (suspect personality impacts this)    Cueing Cueing Techniques: Verbal cues  Exercises  Educated on AROM for knee flexion in standing to reduce stiffness within tolerance, pt completed 5 reps.    General Comments        Pertinent Vitals/Pain Pain Assessment Pain Assessment: Faces Faces Pain Scale: Hurts even more Pain Location: R LE Pain Descriptors / Indicators: Aching, Grimacing, Discomfort, Moaning Pain Intervention(s): Limited activity within patient's tolerance, Monitored during session, Premedicated before session, Repositioned    Home Living                          Prior Function            PT Goals (current goals can now be found in the care plan section) Acute Rehab PT Goals PT Goal Formulation: With patient Time For Goal Achievement: 05/05/23 Potential to Achieve Goals: Good Progress towards PT goals: Progressing toward goals    Frequency    Min 1X/week      PT Plan      Co-evaluation              AM-PAC PT "6 Clicks" Mobility   Outcome Measure  Help needed turning from your back to your side while in a flat bed without using bedrails?: None Help needed moving from lying on your back to sitting on the side of a flat bed without using bedrails?: None Help needed moving to and from a bed to a chair (including a wheelchair)?: A Little Help needed standing up from a chair using your arms (e.g., wheelchair or bedside chair)?: A Little Help needed to walk in hospital room?: A Little Help needed climbing 3-5 steps with a railing? : A Little 6 Click Score: 20    End of Session Equipment Utilized During Treatment: Gait belt Activity Tolerance: Patient tolerated treatment well;Patient  limited by pain Patient left: in bed;with call bell/phone within reach Nurse Communication: Mobility status PT Visit Diagnosis: Other abnormalities of gait and mobility (R26.89);Muscle weakness (generalized) (M62.81);Pain;Difficulty in walking, not elsewhere classified (R26.2) Pain - Right/Left: Right Pain - part of body: Knee     Time: 1110-1150 PT Time Calculation (min) (ACUTE ONLY): 40 min  Charges:    $Gait Training: 23-37 mins $Therapeutic Activity: 8-22 mins PT General Charges $$ ACUTE PT VISIT: 1 Visit                     Wynn Maudlin, DPT Acute Rehabilitation Services Office [phone]  04/28/23 12:12 PM

## 2023-04-29 DIAGNOSIS — M00061 Staphylococcal arthritis, right knee: Secondary | ICD-10-CM | POA: Diagnosis not present

## 2023-04-29 NOTE — Progress Notes (Signed)
 Patient refusing to have bed alarm on, he was educated on reasons and the risk. Carry verbalized understanding of risk involved and call bell remained within reach with bed wheels locked, bed in low position. Jonathan Ibarra educated to not get up without staff assistance and to notify staff when needing to get up. Dr. David Stall notified of his refusal of alarms on.

## 2023-04-29 NOTE — Progress Notes (Signed)
 Physical Therapy Treatment Patient Details Name: Jonathan Ibarra MRN: [id number] DOB: 1965-04-12 Today's Date: 04/29/2023   History of Present Illness Pt is a 58 yr old male who presented on 04/16/23 due to increase in edema and pain in r knee following a fall. Pt s/p 04/20/23 removal of hardware, I & D of R knee. Pt R LE WBAT with wound vac. PMH: liver cirrhosis, s/p TIPS, HTN, polysubstance abuse, afib, R plateau fx requiring ORIF on 12/13/22 and has had irrigation and debridement of R knee in OP setting    PT Comments  Patient agreeable to mobilize with moderate encouragement. Patient completing bed mobility at mod ind level with use of bed features. No cues needed today for safety with transfer, pt less impulsive. Pt requesting use of bathroom to void bladder initially and then amb hallway distance ~250' with RW, slightly increased antalgia compared to yesterday but no LOB noted. EOS pt returned to supine in bed and repositioned for comfort. Reviewed exercises for ROM to Rt knee. Pt refusing bed alarm and RN notified, RN has notified MD already. All needs and call bell within reach at EOS. Will continue to progress as able.    If plan is discharge home, [name];[name];Assist for transportation;Help with stairs or ramp for entrance   Can travel by private vehicle     Yes  Equipment Recommendations  Rolling walker (2 wheels);Gilmer Mor (pt wants a SPC, reports has  RW at home (unsure if this is accurate) RW is safer for mobility at this time)    Recommendations for Other Services       Precautions / Restrictions Precautions Precautions: Fall Precaution/Restrictions Comments: wound vac RLE Restrictions Weight Bearing Restrictions Per Provider Order: Yes RLE Weight Bearing Per Provider Order: Weight bearing as tolerated     Mobility  Bed Mobility Overal bed mobility: Modified  Independent Bed Mobility: Supine to Sit, Sit to Supine     Supine to sit: Modified independent (Device/Increase time) Sit to supine: Modified independent (Device/Increase time)        Transfers Overall transfer level: Needs assistance Equipment used: Rolling walker (2 wheels) Transfers: Sit to/from Stand Sit to Stand: Supervision           General transfer comment: no cues needed, sup for safety    Ambulation/Gait Ambulation/Gait assistance: Supervision Gait Distance (Feet): 250 Feet Assistive device: Rolling walker (2 wheels), Straight cane Gait Pattern/deviations: Step-through pattern, Decreased step length - right, Decreased step length - left, Decreased weight shift to right, Trunk flexed Gait velocity: fair     General Gait Details: sup for safety with amb in hallway, no buckling at Rt LE. pt moaning and Rt knee flexed slightly as pain increased and fatigued   Stairs             Wheelchair Mobility     Tilt Bed    Modified Rankin (Stroke Patients Only)       Balance Overall balance assessment: Needs assistance Sitting-balance support: [name], During functional activity, Reliant on assistive device for balance Standing balance-Leahy Scale: Fair Standing balance comment: reliant on device/external support                            Communication Communication Communication: No apparent difficulties  Cognition Arousal: Alert Behavior During Therapy:  WFL for tasks assessed/performed   PT - Cognitive impairments: No apparent impairments                         Following commands: Intact Following commands impaired: Follows multi-step commands inconsistently (suspect personality impacts this)    Cueing Cueing Techniques: Verbal cues  Exercises Total Joint Exercises Quad Sets: AROM, Right, 5 reps Heel Slides:  AROM, Right, 5 reps    General Comments        Pertinent Vitals/Pain Pain Assessment Pain Assessment: Faces Faces Pain Scale: Hurts even more Pain Location: R LE Pain Descriptors / Indicators: Aching, Grimacing, Discomfort, Moaning Pain Intervention(s): Monitored during session, Limited activity within patient's tolerance, Premedicated before session, Repositioned    Home Living                          Prior Function            PT Goals (current goals can now be found in the care plan section) Acute Rehab PT Goals PT Goal Formulation: With patient Time For Goal Achievement: 05/05/23 Potential to Achieve Goals: Good Progress towards PT goals: Progressing toward goals    Frequency    Min 1X/week      PT Plan      Co-evaluation              AM-PAC PT "6 Clicks" Mobility   Outcome Measure  Help needed turning from your back to your side while in a flat bed without using bedrails?: None Help needed moving from lying on your back to sitting on the side of a flat bed without using bedrails?: None Help needed moving to and from a bed to a chair (including a wheelchair)?: A Little Help needed standing up from a chair using your arms (e.g., wheelchair or bedside chair)?: A Little Help needed to walk in hospital room?: A Little Help needed climbing 3-5 steps with a railing? : A Little 6 Click Score: 20    End of Session Equipment Utilized During Treatment: Gait belt Activity Tolerance: Patient tolerated treatment well;Patient limited by pain Patient left: in bed;with call bell/phone within reach Nurse Communication: Mobility status PT Visit Diagnosis: Other abnormalities of gait and mobility (R26.89);Muscle weakness (generalized) (M62.81);Pain;Difficulty in walking, not elsewhere classified (R26.2) Pain - Right/Left: Right Pain - part of body: Knee     Time: 8657-8469 PT Time Calculation (min) (ACUTE ONLY): 34 min  Charges:    $Gait Training:  8-22 mins $Therapeutic Activity: 8-22 mins PT General Charges $$ ACUTE PT VISIT: 1 Visit                     Wynn Maudlin, DPT Acute Rehabilitation Services Office [phone]  04/29/23 12:29 PM

## 2023-04-29 NOTE — Progress Notes (Signed)
 Patient educated on not removing wound vac. He states "I am taking the wound vac off now its my knee were talking about". This RN told him that it is planned to be removed on Monday. Jonathan Ibarra educated on not removing that he has a lot of drainage and infection in knee. Sarah PA notified.

## 2023-04-29 NOTE — Plan of Care (Signed)
  Problem: Education: Goal: Knowledge of General Education information will improve Description: Including pain rating scale, medication(s)/side effects and non-pharmacologic comfort measures Outcome: Progressing   Problem: Nutrition: Goal: Adequate nutrition will be maintained Outcome: Progressing   Problem: Pain Managment: Goal: General experience of comfort will improve and/or be controlled Outcome: Progressing

## 2023-04-29 NOTE — Progress Notes (Signed)
 [facility] PROGRESS NOTE    Progress Note  Jonathan Ibarra  NFA:[id number] DOB: Jul 21, 1965 DOA: 04/16/2023 PCP: Candi Leash, MD     Brief Narrative:   Jonathan Ibarra is an 58 y.o. male past medical history of cirrhosis, status post procedure, essential hypertension, polysubstance abuse, SVT paroxysmal atrial fibrillation not a candidate for anticoagulation, with a recent plateau fracture requiring ORIF in October 2024, discharged on 02/04/2023 for purulent discharge from the right knee, it was believe that he had postoperative septic arthritis patient was managed with IV Invanz seen by ID and discharged home on omadacycline, patient has undergone I&D of the right knee 3 times as an outpatient, did not follow-up with orthopedic surgery on his last appointment presenting to the ED with worsening swelling and pain with purulent discharge right knee x-ray showed a right large effusion soft tissue edema orthopedic surgery and infectious disease were consulted, blood cultures were positive for MRSA, underwent I&D with hardware removal on 04/20/2023 underwent TEE on 04/21/2023 was negative for endocarditis  Assessment/Plan:   Septic joint of right knee joint and hardware/MRSA bacteremia: History of multiple I&D's and outpatient will complete his treatment as an outpatient. Came back to the ED orthopedic surgery was consulted he status post hardware removal and I&D on 04/20/2023.   Wound VAC was placed. Blood cultures grew MRSA, TEE was negative for endocarditis. Infectious disease was consulted ,Surveillance blood culture on 04/18/2023 remain negative. He is a poor candidate for PICC line, infectious disease recommended to continue IV daptomycin for 3 weeks, end date of treatment 05/03/2023.   Then he will need to go to SNF. Call ID on the day of discharge for follow-up appointment.  Chronic atrial fibrillation: Not a candidate for anticoagulation. Continue metoprolol and  Cardizem.  Liver cirrhosis status post TIPS/thrombocytopenia/: Continue lactulose.  Polysubstance abuse: Continue counseling.  Normocytic anemia: No signs of overt blood loss. Hemoglobin is relatively stable.  Obesity: Noted.  Essential hypertension Currently on no antihypertensive medication, relatively well-controlled.  Polysubstance abuse (HCC) Noted.    DVT prophylaxis: lovenox Family Communication:none Status is: Inpatient Remains inpatient appropriate because: Septic knee    Code Status:     Code Status Orders  (From admission, onward)           Start     Ordered   04/16/23 1554  Full code  Continuous       Question:  By:  Answer:  Consent: discussion documented in EHR   04/16/23 1555           Code Status History     Date Active Date Inactive Code Status Order ID Comments User Context   12/28/2022 0822 02/04/2023 2234 Full Code [id number]  Lajoyce Corners, NP ED   12/13/2022 0336 12/17/2022 2206 Full Code [id number]  Dolly Rias, MD ED   10/01/2022 0408 10/08/2022 1755 Full Code [id number]  Carollee Herter, DO ED   10/01/2022 0345 10/01/2022 0408 Full Code [id number]  Carollee Herter, DO ED   04/23/2017 0001 04/28/2017 2028 Full Code [id number]  Hillary Bow, DO ED   02/04/2015 1730 02/07/2015 1519 Full Code [id number]  Alison Murray, MD Inpatient         IV Access:   Peripheral IV   Procedures and diagnostic studies:   No results found.   Medical Consultants:   None.   Subjective:    Jonathan Ibarra does not want to go SNF  Objective:    Vitals:   04/28/23 1312 04/28/23  2135 04/29/23 0535 04/29/23 0754  BP:  113/75 118/74 122/75  Pulse: 90 83 82 88  Resp:  18 19 16   Temp:  (!) 97.5 F (36.4 C) 97.7 F (36.5 C)   TempSrc:      SpO2: 95% 100% 97% 98%  Weight:      Height:       SpO2: 98 % O2 Flow Rate (L/min): 0 L/min   Intake/Output Summary (Last 24 hours) at 04/29/2023 0846 Last data filed at 04/29/2023 0547 Gross per 24  hour  Intake 796 ml  Output 550 ml  Net 246 ml   Filed Weights   04/16/23 1819  Weight: 118.2 kg    Exam: General exam: In no acute distress. Respiratory system: Good air movement and clear to auscultation. Cardiovascular system: S1 & S2 heard, RRR. No JVD.  Gastrointestinal system: Abdomen is nondistended, soft and nontender.  Extremities: No pedal edema. Skin: No rashes, lesions or ulcers Psychiatry: Judgement and insight appear normal. Mood & affect appropriate.    Data Reviewed:    Labs: Basic Metabolic Panel: Recent Labs  Lab 04/26/23 0609  NA 136  K 4.6  CL 99  CO2 32  GLUCOSE 117*  BUN 10  CREATININE 0.76  CALCIUM 8.6*   GFR Estimated Creatinine Clearance: 136.8 mL/min (by C-G formula based on SCr of 0.76 mg/dL). Liver Function Tests: No results for input(s): "AST", "ALT", "ALKPHOS", "BILITOT", "PROT", "ALBUMIN" in the last 168 hours. No results for input(s): "LIPASE", "AMYLASE" in the last 168 hours. No results for input(s): "AMMONIA" in the last 168 hours. Coagulation profile No results for input(s): "INR", "PROTIME" in the last 168 hours.  COVID-19 Labs  No results for input(s): "DDIMER", "FERRITIN", "LDH", "CRP" in the last 72 hours.   Lab Results  Component Value Date   SARSCOV2NAA NEGATIVE 09/30/2022    CBC: Recent Labs  Lab 04/26/23 0609  WBC 8.1  NEUTROABS 6.6  HGB 11.4*  HCT 34.8*  MCV 90.2  PLT 192   Cardiac Enzymes: Recent Labs  Lab 04/26/23 0609  CKTOTAL 19*   BNP (last 3 results) No results for input(s): "PROBNP" in the last 8760 hours. CBG: No results for input(s): "GLUCAP" in the last 168 hours. D-Dimer: No results for input(s): "DDIMER" in the last 72 hours. Hgb A1c: No results for input(s): "HGBA1C" in the last 72 hours. Lipid Profile: No results for input(s): "CHOL", "HDL", "LDLCALC", "TRIG", "CHOLHDL", "LDLDIRECT" in the last 72 hours. Thyroid function studies: No results for input(s): "TSH", "T4TOTAL",  "T3FREE", "THYROIDAB" in the last 72 hours.  Invalid input(s): "FREET3" Anemia work up: No results for input(s): "VITAMINB12", "FOLATE", "FERRITIN", "TIBC", "IRON", "RETICCTPCT" in the last 72 hours. Sepsis Labs: Recent Labs  Lab 04/26/23 0609  WBC 8.1   Microbiology Recent Results (from the past 240 hours)  Surgical pcr screen     Status: None   Collection Time: 04/20/23  7:36 AM   Specimen: Nasal Mucosa; Nasal Swab  Result Value Ref Range Status   MRSA, PCR NEGATIVE NEGATIVE Final   Staphylococcus aureus NEGATIVE NEGATIVE Final    Comment: (NOTE) The Xpert SA Assay (FDA approved for NASAL specimens in patients 61 years of age and older), is one component of a comprehensive surveillance program. It is not intended to diagnose infection nor to guide or monitor treatment. Performed at [facility], [address], [place], [place] [address]   Aerobic/Anaerobic Culture w Gram Stain (surgical/deep wound)     Status: None  Collection Time: 04/20/23 11:00 AM   Specimen: PATH Cytology Misc. fluid; Body Fluid  Result Value Ref Range Status   Specimen Description FLUID  Final   Special Requests NONE  Final   Gram Stain   Final    ABUNDANT WBC PRESENT, PREDOMINANTLY PMN RARE GRAM POSITIVE COCCI IN PAIRS    Culture   Final    RARE METHICILLIN RESISTANT STAPHYLOCOCCUS AUREUS NO ANAEROBES ISOLATED Performed at [facility], [address] [place], [place] [address]    Report Status 04/25/2023 FINAL  Final   Organism ID, Bacteria METHICILLIN RESISTANT STAPHYLOCOCCUS AUREUS  Final      Susceptibility   Methicillin resistant staphylococcus aureus - MIC*    CIPROFLOXACIN >=8 RESISTANT Resistant     ERYTHROMYCIN >=8 RESISTANT Resistant     GENTAMICIN <=0.5 SENSITIVE Sensitive     OXACILLIN >=4 RESISTANT Resistant     TETRACYCLINE >=16 RESISTANT Resistant     VANCOMYCIN 1 SENSITIVE Sensitive     TRIMETH/SULFA >=320 RESISTANT Resistant     CLINDAMYCIN <=0.25 SENSITIVE  Sensitive     RIFAMPIN <=0.5 SENSITIVE Sensitive     Inducible Clindamycin NEGATIVE Sensitive     LINEZOLID 2 SENSITIVE Sensitive     * RARE METHICILLIN RESISTANT STAPHYLOCOCCUS AUREUS  Aerobic/Anaerobic Culture w Gram Stain (surgical/deep wound)     Status: None   Collection Time: 04/20/23 11:00 AM   Specimen: PATH Soft tissue  Result Value Ref Range Status   Specimen Description FLUID  Final   Special Requests NONE  Final   Gram Stain   Final    RARE WBC PRESENT, PREDOMINANTLY MONONUCLEAR NO ORGANISMS SEEN    Culture   Final    RARE STAPHYLOCOCCUS AUREUS SUSCEPTIBILITIES PERFORMED ON PREVIOUS CULTURE WITHIN THE LAST 5 DAYS. NO ANAEROBES ISOLATED Performed at [facility], [address], [place], [place] [address]    Report Status 04/25/2023 FINAL  Final  Aerobic/Anaerobic Culture w Gram Stain (surgical/deep wound)     Status: None   Collection Time: 04/20/23 11:06 AM   Specimen: PATH Cytology Misc. fluid; Body Fluid  Result Value Ref Range Status   Specimen Description FLUID  Final   Special Requests NONE  Final   Gram Stain   Final    ABUNDANT WBC PRESENT, PREDOMINANTLY PMN NO ORGANISMS SEEN    Culture   Final    RARE METHICILLIN RESISTANT STAPHYLOCOCCUS AUREUS NO ANAEROBES ISOLATED Performed at [facility], [address], [place], [place] [address]    Report Status 04/25/2023 FINAL  Final   Organism ID, Bacteria METHICILLIN RESISTANT STAPHYLOCOCCUS AUREUS  Final      Susceptibility   Methicillin resistant staphylococcus aureus - MIC*    CIPROFLOXACIN >=8 RESISTANT Resistant     ERYTHROMYCIN >=8 RESISTANT Resistant     GENTAMICIN <=0.5 SENSITIVE Sensitive     OXACILLIN >=4 RESISTANT Resistant     TETRACYCLINE >=16 RESISTANT Resistant     VANCOMYCIN 1 SENSITIVE Sensitive     TRIMETH/SULFA 160 RESISTANT Resistant     CLINDAMYCIN <=0.25 SENSITIVE Sensitive     RIFAMPIN <=0.5 SENSITIVE Sensitive     Inducible Clindamycin NEGATIVE Sensitive      LINEZOLID 2 SENSITIVE Sensitive     * RARE METHICILLIN RESISTANT STAPHYLOCOCCUS AUREUS     Medications:    diltiazem  180 mg Oral Daily   docusate sodium  100 mg Oral BID   folic acid  1 mg Oral Daily   lactulose  30 g  Oral TID   metoprolol succinate  50 mg Oral Daily   polyethylene glycol  17 g Oral Daily   senna  1 tablet Oral BID   sodium chloride flush  3-10 mL Intravenous Q12H   thiamine  100 mg Oral Daily   Continuous Infusions:  DAPTOmycin Stopped (04/28/23 1640)      LOS: 13 days   Jonathan Ibarra  [facility]  04/29/2023, 8:46 AM

## 2023-04-29 NOTE — Progress Notes (Signed)
 Orthopaedic Trauma Progress Note  SUBJECTIVE: Resting in bed this morning.  Denies any significant numbness or tingling throughout the leg.  Notes swelling is stable about the knee.  No other issues of note.  Tolerating diet.  Tolerating his IV antibiotics.  Continues to have significant drainage from his wound VAC  OBJECTIVE:  Vitals:   04/29/23 0535 04/29/23 0754  BP: 118/74 122/75  Pulse: 82 88  Resp: 19 16  Temp: 97.7 F (36.5 C)   SpO2: 97% 98%    General: Resting comfortably in bed.  No acute distress  respiratory: No increased work of breathing.  Right lower extremity: Incisional VAC in place.  Continues to have good seal and function, about 400 mL serosanguineous fluid in wound VAC canister.  Swelling through the calf present but stable.  Compartments compressible.   2+ DP pulse  IMAGING: Intraoperative imaging confirmed removal of all hardware   LABS:  No results found for this or any previous visit (from the past 24 hours).   ASSESSMENT: Jonathan Ibarra is a 58 y.o. male 1 day postop s/p  HARDWARE REMOVAL RIGHT TIBIAL PLATEAU IRRIGATION DEBRIDEMENT RIGHT KNEE  CV/Blood loss: Acute blood loss anemia.  Hemoglobin 11.4 on 04/26/2023.  Hemodynamically stable  PLAN: Weightbearing: WBAT RLE ROM: Motion as tolerated Incisional and dressing care: Reinforce as needed Showering: Hold off on showering until wound VAC removed  Orthopedic device(s): Incisional VAC RLE Pain management: Continue multimodal pain control  VTE prophylaxis: Aspirin, SCDs ID: Per infectious disease team Foley/Lines:  No foley, KVO IVFs Impediments to Fracture Healing: Infection Dispo: PT/OT as able.  Continue IV antibiotics per infectious disease.  Will maintain incisional VAC to right lower extremity until 05/02/2023.  Will transition to dry dressing at that point.  No further surgeries planned from orthopedic standpoint.  Follow - up plan: 2 weeks after discharge for wound check and repeat  x-rays   Contact information:  Truitt Merle MD, Thyra Breed PA-C. After hours and holidays please check [email or web address] for group call information for [facility]   Thompson Caul, PA-C [phone] (office) [email or web address]

## 2023-04-29 NOTE — Plan of Care (Signed)

## 2023-04-29 NOTE — Discharge Instructions (Addendum)
 Orthopaedic Trauma Service Discharge Instructions   General Discharge Instructions  WEIGHT BEARING STATUS: Weightbearing as tolerated right lower extremity  RANGE OF MOTION/ACTIVITY: Okay for knee motion as tolerated  DVT/PE prophylaxis: None  Diet: as you were eating previously.  Can use over the counter stool softeners and bowel preparations, such as Miralax, to help with bowel movements.  Narcotics can be constipating.  Be sure to drink plenty of fluids  PAIN MEDICATION USE AND EXPECTATIONS  You have likely been given narcotic medications to help control your pain.  After a traumatic event that results in an fracture (broken bone) with or without surgery, it is ok to use narcotic pain medications to help control one's pain.  We understand that everyone responds to pain differently and each individual patient will be evaluated on a regular basis for the continued need for narcotic medications. Ideally, narcotic medication use should last no more than 6-8 weeks (coinciding with fracture healing).   As a patient it is your responsibility as well to monitor narcotic medication use and report the amount and frequency you use these medications when you come to your office visit.   We would also advise that if you are using narcotic medications, you should take a dose prior to therapy to maximize you participation.  IF YOU ARE ON NARCOTIC MEDICATIONS IT IS NOT PERMISSIBLE TO OPERATE A MOTOR VEHICLE (MOTORCYCLE/CAR/TRUCK/MOPED) OR HEAVY MACHINERY DO NOT MIX NARCOTICS WITH OTHER CNS (CENTRAL NERVOUS SYSTEM) DEPRESSANTS SUCH AS ALCOHOL   STOP SMOKING OR USING NICOTINE PRODUCTS!!!!  As discussed nicotine severely impairs your body's ability to heal surgical and traumatic wounds but also impairs bone healing.  Wounds and bone heal by forming microscopic blood vessels (angiogenesis) and nicotine is a vasoconstrictor (essentially, shrinks blood vessels).  Therefore, if vasoconstriction occurs to  these microscopic blood vessels they essentially disappear and are unable to deliver necessary nutrients to the healing tissue.  This is one modifiable factor that you can do to dramatically increase your chances of healing your injury.    (This means no smoking, no nicotine gum, patches, etc)  DO NOT USE NONSTEROIDAL ANTI-INFLAMMATORY DRUGS (NSAID'S)  Using products such as Advil (ibuprofen), Aleve (naproxen), Motrin (ibuprofen) for additional pain control during fracture healing can delay and/or prevent the healing response.  If you would like to take over the counter (OTC) medication, Tylenol (acetaminophen) is ok.  However, some narcotic medications that are given for pain control contain acetaminophen as well. Therefore, you should not exceed more than 4000 mg of tylenol in a day if you do not have liver disease.  Also note that there are may OTC medicines, such as cold medicines and allergy medicines that my contain tylenol as well.  If you have any questions about medications and/or interactions please ask your doctor/PA or your pharmacist.      ICE AND ELEVATE INJURED/OPERATIVE EXTREMITY  Using ice and elevating the injured extremity above your heart can help with swelling and pain control.  Icing in a pulsatile fashion, such as 20 minutes on and 20 minutes off, can be followed.    Do not place ice directly on skin. Make sure there is a barrier between to skin and the ice pack.    Using frozen items such as frozen peas works well as the conform nicely to the are that needs to be iced.  USE AN ACE WRAP OR TED HOSE FOR SWELLING CONTROL  In addition to icing and elevation, Ace wraps or TED hose  are used to help limit and resolve swelling.  It is recommended to use Ace wraps or TED hose until you are informed to stop.    When using Ace Wraps start the wrapping distally (farthest away from the body) and wrap proximally (closer to the body)   Example: If you had surgery on your leg or thing and you  do not have a splint on, start the ace wrap at the toes and work your way up to the thigh        If you had surgery on your upper extremity and do not have a splint on, start the ace wrap at your fingers and work your way up to the upper arm   CALL THE OFFICE WITH ANY QUESTIONS OR CONCERNS: [phone]   VISIT OUR WEBSITE FOR ADDITIONAL INFORMATION: [email or web address]    Discharge Wound Care Instructions  Do NOT apply any ointments, solutions or lotions to pin sites or surgical wounds.  These prevent needed drainage and even though solutions like hydrogen peroxide kill bacteria, they also damage cells lining the pin sites that help fight infection.  Applying lotions or ointments can keep the wounds moist and can cause them to breakdown and open up as well. This can increase the risk for infection. When in doubt call the office.  Surgical incisions should be dressed daily.  If any drainage is noted, use one layer of adaptic or Mepitel, then gauze, Kerlix, and an ace wrap. - These dressing supplies should be available at local medical supply stores [facility], [facility], etc) as well as Insurance claims handler ([facility], [facility], [employer], etc)  Once the incision is completely dry and without drainage, it may be left open to air out.  Showering may begin 36-48 hours later.  Cleaning gently with soap and water.

## 2023-04-29 NOTE — Progress Notes (Signed)
 Front wheel walker and tub chair was to be delivered by [employer] and pt is upset he wanted a bariatric walker as the one that was delivered was to small but he did not qualify due to his weight and he would have to pay out of pocket for walker he refused to pay and refused both equipments. No equipment left in room per pt request.

## 2023-04-30 DIAGNOSIS — M00061 Staphylococcal arthritis, right knee: Secondary | ICD-10-CM | POA: Diagnosis not present

## 2023-04-30 MED ORDER — HYDROMORPHONE HCL 1 MG/ML IJ SOLN
1.0000 mg | INTRAMUSCULAR | Status: DC | PRN
  Administered 2023-04-30 – 2023-05-03 (×11): 1 mg via INTRAVENOUS
  Filled 2023-04-30 (×11): qty 1

## 2023-04-30 MED ORDER — OXYCODONE HCL ER 10 MG PO T12A
10.0000 mg | EXTENDED_RELEASE_TABLET | Freq: Two times a day (BID) | ORAL | Status: DC
  Administered 2023-04-30 – 2023-05-03 (×7): 10 mg via ORAL
  Filled 2023-04-30 (×7): qty 1

## 2023-04-30 NOTE — Progress Notes (Signed)
 [facility] PROGRESS NOTE    Progress Note  Jonathan Ibarra  ZOX:[id number] DOB: December 30, 1965 DOA: 04/16/2023 PCP: Candi Leash, MD     Brief Narrative:   Jonathan Ibarra is an 58 y.o. male past medical history of cirrhosis, status post procedure, essential hypertension, polysubstance abuse, SVT paroxysmal atrial fibrillation not a candidate for anticoagulation, with a recent plateau fracture requiring ORIF in October 2024, discharged on 02/04/2023 for purulent discharge from the right knee, it was believe that he had postoperative septic arthritis patient was managed with IV Invanz seen by ID and discharged home on omadacycline, patient has undergone I&D of the right knee 3 times as an outpatient, did not follow-up with orthopedic surgery on his last appointment presenting to the ED with worsening swelling and pain with purulent discharge right knee x-ray showed a right large effusion soft tissue edema orthopedic surgery and infectious disease were consulted, blood cultures were positive for MRSA, underwent I&D with hardware removal on 04/20/2023 underwent TEE on 04/21/2023 was negative for endocarditis  Assessment/Plan:   Septic joint of right knee joint and hardware/MRSA bacteremia: History of multiple I&D's and outpatient will complete his treatment as an outpatient. Came back to the ED orthopedic surgery was consulted he status post hardware removal and I&D on 04/20/2023.   Wound VAC was placed. Blood cultures grew MRSA, TEE was negative for endocarditis. Infectious disease was consulted ,Surveillance blood culture on 04/18/2023 remain negative. He is a poor candidate for PICC line, infectious disease recommended to continue IV daptomycin for 3 weeks, end date of treatment 05/03/2023.   Then he will need to go to SNF. Call ID on the day of discharge for follow-up appointment. Complaining of pain this morning started on long-acting oxycodone continue short acting and Dilaudid for  breakthrough.  Chronic atrial fibrillation: Not a candidate for anticoagulation. Continue metoprolol and Cardizem.  Liver cirrhosis status post TIPS/thrombocytopenia/: Continue lactulose.  Polysubstance abuse: Continue counseling.  Normocytic anemia: No signs of overt blood loss. Hemoglobin is relatively stable.  Obesity: Noted.  Essential hypertension Currently on no antihypertensive medication, relatively well-controlled.  Polysubstance abuse (HCC) Noted.    DVT prophylaxis: lovenox Family Communication:none Status is: Inpatient Remains inpatient appropriate because: Septic knee    Code Status:     Code Status Orders  (From admission, onward)           Start     Ordered   04/16/23 1554  Full code  Continuous       Question:  By:  Answer:  Consent: discussion documented in EHR   04/16/23 1555           Code Status History     Date Active Date Inactive Code Status Order ID Comments User Context   12/28/2022 0822 02/04/2023 2234 Full Code [id number]  Lajoyce Corners, NP ED   12/13/2022 0336 12/17/2022 2206 Full Code [id number]  Dolly Rias, MD ED   10/01/2022 0408 10/08/2022 1755 Full Code [id number]  Carollee Herter, DO ED   10/01/2022 0345 10/01/2022 0408 Full Code [id number]  Carollee Herter, DO ED   04/23/2017 0001 04/28/2017 2028 Full Code [id number]  Hillary Bow, DO ED   02/04/2015 1730 02/07/2015 1519 Full Code [id number]  Alison Murray, MD Inpatient         IV Access:   Peripheral IV   Procedures and diagnostic studies:   No results found.   Medical Consultants:   None.   Subjective:    Jonathan Ibarra complete  that his pain is not controlled.  Objective:    Vitals:   04/29/23 1952 04/29/23 2234 04/30/23 0424 04/30/23 0743  BP: 101/66 110/73 133/76 112/73  Pulse: 90  85 99  Resp:   18 20  Temp: 97.9 F (36.6 C)  98.6 F (37 C) 97.9 F (36.6 C)  TempSrc:      SpO2: 100%  93% 98%  Weight:      Height:       SpO2: 98 % O2  Flow Rate (L/min): 0 L/min   Intake/Output Summary (Last 24 hours) at 04/30/2023 1014 Last data filed at 04/30/2023 0744 Gross per 24 hour  Intake 960 ml  Output 1750 ml  Net -790 ml   Filed Weights   04/16/23 1819  Weight: 118.2 kg    Exam: General exam: In no acute distress. Respiratory system: Good air movement and clear to auscultation. Cardiovascular system: S1 & S2 heard, RRR. No JVD. Gastrointestinal system: Abdomen is nondistended, soft and nontender.  Extremities: No pedal edema. Skin: No rashes, lesions or ulcers Psychiatry: Judgement and insight appear normal. Mood & affect appropriate. Data Reviewed:    Labs: Basic Metabolic Panel: Recent Labs  Lab 04/26/23 0609  NA 136  K 4.6  CL 99  CO2 32  GLUCOSE 117*  BUN 10  CREATININE 0.76  CALCIUM 8.6*   GFR Estimated Creatinine Clearance: 136.8 mL/min (by C-G formula based on SCr of 0.76 mg/dL). Liver Function Tests: No results for input(s): "AST", "ALT", "ALKPHOS", "BILITOT", "PROT", "ALBUMIN" in the last 168 hours. No results for input(s): "LIPASE", "AMYLASE" in the last 168 hours. No results for input(s): "AMMONIA" in the last 168 hours. Coagulation profile No results for input(s): "INR", "PROTIME" in the last 168 hours.  COVID-19 Labs  No results for input(s): "DDIMER", "FERRITIN", "LDH", "CRP" in the last 72 hours.   Lab Results  Component Value Date   SARSCOV2NAA NEGATIVE 09/30/2022    CBC: Recent Labs  Lab 04/26/23 0609  WBC 8.1  NEUTROABS 6.6  HGB 11.4*  HCT 34.8*  MCV 90.2  PLT 192   Cardiac Enzymes: Recent Labs  Lab 04/26/23 0609  CKTOTAL 19*   BNP (last 3 results) No results for input(s): "PROBNP" in the last 8760 hours. CBG: No results for input(s): "GLUCAP" in the last 168 hours. D-Dimer: No results for input(s): "DDIMER" in the last 72 hours. Hgb A1c: No results for input(s): "HGBA1C" in the last 72 hours. Lipid Profile: No results for input(s): "CHOL", "HDL",  "LDLCALC", "TRIG", "CHOLHDL", "LDLDIRECT" in the last 72 hours. Thyroid function studies: No results for input(s): "TSH", "T4TOTAL", "T3FREE", "THYROIDAB" in the last 72 hours.  Invalid input(s): "FREET3" Anemia work up: No results for input(s): "VITAMINB12", "FOLATE", "FERRITIN", "TIBC", "IRON", "RETICCTPCT" in the last 72 hours. Sepsis Labs: Recent Labs  Lab 04/26/23 0609  WBC 8.1   Microbiology Recent Results (from the past 240 hours)  Aerobic/Anaerobic Culture w Gram Stain (surgical/deep wound)     Status: None   Collection Time: 04/20/23 11:00 AM   Specimen: PATH Cytology Misc. fluid; Body Fluid  Result Value Ref Range Status   Specimen Description FLUID  Final   Special Requests NONE  Final   Gram Stain   Final    ABUNDANT WBC PRESENT, PREDOMINANTLY PMN RARE GRAM POSITIVE COCCI IN PAIRS    Culture   Final    RARE METHICILLIN RESISTANT STAPHYLOCOCCUS AUREUS NO ANAEROBES ISOLATED Performed at [facility], [address], [place], [place] [address]  Report Status 04/25/2023 FINAL  Final   Organism ID, Bacteria METHICILLIN RESISTANT STAPHYLOCOCCUS AUREUS  Final      Susceptibility   Methicillin resistant staphylococcus aureus - MIC*    CIPROFLOXACIN >=8 RESISTANT Resistant     ERYTHROMYCIN >=8 RESISTANT Resistant     GENTAMICIN <=0.5 SENSITIVE Sensitive     OXACILLIN >=4 RESISTANT Resistant     TETRACYCLINE >=16 RESISTANT Resistant     VANCOMYCIN 1 SENSITIVE Sensitive     TRIMETH/SULFA >=320 RESISTANT Resistant     CLINDAMYCIN <=0.25 SENSITIVE Sensitive     RIFAMPIN <=0.5 SENSITIVE Sensitive     Inducible Clindamycin NEGATIVE Sensitive     LINEZOLID 2 SENSITIVE Sensitive     * RARE METHICILLIN RESISTANT STAPHYLOCOCCUS AUREUS  Aerobic/Anaerobic Culture w Gram Stain (surgical/deep wound)     Status: None   Collection Time: 04/20/23 11:00 AM   Specimen: PATH Soft tissue  Result Value Ref Range Status   Specimen Description FLUID  Final   Special Requests  NONE  Final   Gram Stain   Final    RARE WBC PRESENT, PREDOMINANTLY MONONUCLEAR NO ORGANISMS SEEN    Culture   Final    RARE STAPHYLOCOCCUS AUREUS SUSCEPTIBILITIES PERFORMED ON PREVIOUS CULTURE WITHIN THE LAST 5 DAYS. NO ANAEROBES ISOLATED Performed at [facility], [address] [place], [place] [address]    Report Status 04/25/2023 FINAL  Final  Aerobic/Anaerobic Culture w Gram Stain (surgical/deep wound)     Status: None   Collection Time: 04/20/23 11:06 AM   Specimen: PATH Cytology Misc. fluid; Body Fluid  Result Value Ref Range Status   Specimen Description FLUID  Final   Special Requests NONE  Final   Gram Stain   Final    ABUNDANT WBC PRESENT, PREDOMINANTLY PMN NO ORGANISMS SEEN    Culture   Final    RARE METHICILLIN RESISTANT STAPHYLOCOCCUS AUREUS NO ANAEROBES ISOLATED Performed at [facility], [address], [place], [place] [address]    Report Status 04/25/2023 FINAL  Final   Organism ID, Bacteria METHICILLIN RESISTANT STAPHYLOCOCCUS AUREUS  Final      Susceptibility   Methicillin resistant staphylococcus aureus - MIC*    CIPROFLOXACIN >=8 RESISTANT Resistant     ERYTHROMYCIN >=8 RESISTANT Resistant     GENTAMICIN <=0.5 SENSITIVE Sensitive     OXACILLIN >=4 RESISTANT Resistant     TETRACYCLINE >=16 RESISTANT Resistant     VANCOMYCIN 1 SENSITIVE Sensitive     TRIMETH/SULFA 160 RESISTANT Resistant     CLINDAMYCIN <=0.25 SENSITIVE Sensitive     RIFAMPIN <=0.5 SENSITIVE Sensitive     Inducible Clindamycin NEGATIVE Sensitive     LINEZOLID 2 SENSITIVE Sensitive     * RARE METHICILLIN RESISTANT STAPHYLOCOCCUS AUREUS     Medications:    diltiazem  180 mg Oral Daily   docusate sodium  100 mg Oral BID   folic acid  1 mg Oral Daily   lactulose  30 g Oral TID   metoprolol succinate  50 mg Oral Daily   polyethylene glycol  17 g Oral Daily   senna  1 tablet Oral BID   sodium chloride flush  3-10 mL Intravenous Q12H   thiamine  100 mg Oral Daily    Continuous Infusions:  DAPTOmycin 800 mg (04/29/23 1350)      LOS: 14 days   Marinda Elk  [facility]  04/30/2023, 10:14 AM

## 2023-05-01 DIAGNOSIS — M00061 Staphylococcal arthritis, right knee: Secondary | ICD-10-CM | POA: Diagnosis not present

## 2023-05-01 NOTE — Progress Notes (Signed)
 [facility] PROGRESS NOTE    Progress Note  Jonathan Ibarra  WUJ:[id number] DOB: 1966/01/21 DOA: 04/16/2023 PCP: Candi Leash, MD     Brief Narrative:   Jonathan Ibarra is an 58 y.o. male past medical history of cirrhosis, status post procedure, essential hypertension, polysubstance abuse, SVT paroxysmal atrial fibrillation not a candidate for anticoagulation, with a recent plateau fracture requiring ORIF in October 2024, discharged on 02/04/2023 for purulent discharge from the right knee, it was believe that he had postoperative septic arthritis patient was managed with IV Invanz seen by ID and discharged home on omadacycline, patient has undergone I&D of the right knee 3 times as an outpatient, did not follow-up with orthopedic surgery on his last appointment presenting to the ED with worsening swelling and pain with purulent discharge right knee x-ray showed a right large effusion soft tissue edema orthopedic surgery and infectious disease were consulted, blood cultures were positive for MRSA, underwent I&D with hardware removal on 04/20/2023 underwent TEE on 04/21/2023 was negative for endocarditis.  Assessment/Plan:   Septic joint of right knee joint and hardware/MRSA bacteremia: History of multiple I&D's and outpatient will complete his treatment as an outpatient. Came back to the ED orthopedic surgery was consulted he status post hardware removal and I&D on 04/20/2023.   Wound VAC was placed. Blood cultures grew MRSA, TEE was negative for endocarditis. Infectious disease was consulted ,Surveillance blood culture on 04/18/2023 remain negative. He is a poor candidate for PICC line, infectious disease recommended to continue IV daptomycin for 3 weeks, end date of treatment 05/03/2023.   Then he will need to go to SNF. Call ID on the day of discharge for follow-up appointment. Complaining of pain this morning started on long-acting oxycodone continue short acting and Dilaudid for  breakthrough.  Chronic atrial fibrillation: Not a candidate for anticoagulation. Continue metoprolol and Cardizem.  Liver cirrhosis status post TIPS/thrombocytopenia/: Continue lactulose.  Polysubstance abuse: Continue counseling.  Normocytic anemia: No signs of overt blood loss. Hemoglobin is relatively stable.  Obesity: Noted.  Essential hypertension Currently on no antihypertensive medication, relatively well-controlled.  Polysubstance abuse (HCC) Noted.    DVT prophylaxis: lovenox Family Communication:none Status is: Inpatient Remains inpatient appropriate because: Septic knee    Code Status:     Code Status Orders  (From admission, onward)           Start     Ordered   04/16/23 1554  Full code  Continuous       Question:  By:  Answer:  Consent: discussion documented in EHR   04/16/23 1555           Code Status History     Date Active Date Inactive Code Status Order ID Comments User Context   12/28/2022 0822 02/04/2023 2234 Full Code [id number]  Lajoyce Corners, NP ED   12/13/2022 0336 12/17/2022 2206 Full Code [id number]  Dolly Rias, MD ED   10/01/2022 0408 10/08/2022 1755 Full Code [id number]  Carollee Herter, DO ED   10/01/2022 0345 10/01/2022 0408 Full Code [id number]  Carollee Herter, DO ED   04/23/2017 0001 04/28/2017 2028 Full Code [id number]  Hillary Bow, DO ED   02/04/2015 1730 02/07/2015 1519 Full Code [id number]  Alison Murray, MD Inpatient         IV Access:   Peripheral IV   Procedures and diagnostic studies:   No results found.   Medical Consultants:   None.   Subjective:    [name] complete  that his pain is not controlled.  Objective:    Vitals:   04/30/23 1440 04/30/23 1924 05/01/23 0311 05/01/23 0704  BP: 114/68 126/85 130/78 120/79  Pulse: 91 99 100 88  Resp: 18 16 18    Temp: 98.1 F (36.7 C) (!) 97.5 F (36.4 C) 98.2 F (36.8 C) (!) 97.5 F (36.4 C)  TempSrc:  Oral Oral Oral  SpO2: 99% 100% 98% 100%   Weight:      Height:       SpO2: 100 % O2 Flow Rate (L/min): 0 L/min   Intake/Output Summary (Last 24 hours) at 05/01/2023 0953 Last data filed at 05/01/2023 0600 Gross per 24 hour  Intake 660 ml  Output 900 ml  Net -240 ml   Filed Weights   04/16/23 1819  Weight: 118.2 kg    Exam: General exam: In no acute distress. Respiratory system: Good air movement and clear to auscultation. Cardiovascular system: S1 & S2 heard, RRR. No JVD. Gastrointestinal system: Abdomen is nondistended, soft and nontender.  Extremities: No pedal edema. Skin: No rashes, lesions or ulcers Psychiatry: Judgement and insight appear normal. Mood & affect appropriate. Data Reviewed:    Labs: Basic Metabolic Panel: Recent Labs  Lab 04/26/23 0609  NA 136  K 4.6  CL 99  CO2 32  GLUCOSE 117*  BUN 10  CREATININE 0.76  CALCIUM 8.6*   GFR Estimated Creatinine Clearance: 136.8 mL/min (by C-G formula based on SCr of 0.76 mg/dL). Liver Function Tests: No results for input(s): "AST", "ALT", "ALKPHOS", "BILITOT", "PROT", "ALBUMIN" in the last 168 hours. No results for input(s): "LIPASE", "AMYLASE" in the last 168 hours. No results for input(s): "AMMONIA" in the last 168 hours. Coagulation profile No results for input(s): "INR", "PROTIME" in the last 168 hours.  COVID-19 Labs  No results for input(s): "DDIMER", "FERRITIN", "LDH", "CRP" in the last 72 hours.   Lab Results  Component Value Date   SARSCOV2NAA NEGATIVE 09/30/2022    CBC: Recent Labs  Lab 04/26/23 0609  WBC 8.1  NEUTROABS 6.6  HGB 11.4*  HCT 34.8*  MCV 90.2  PLT 192   Cardiac Enzymes: Recent Labs  Lab 04/26/23 0609  CKTOTAL 19*   BNP (last 3 results) No results for input(s): "PROBNP" in the last 8760 hours. CBG: No results for input(s): "GLUCAP" in the last 168 hours. D-Dimer: No results for input(s): "DDIMER" in the last 72 hours. Hgb A1c: No results for input(s): "HGBA1C" in the last 72 hours. Lipid  Profile: No results for input(s): "CHOL", "HDL", "LDLCALC", "TRIG", "CHOLHDL", "LDLDIRECT" in the last 72 hours. Thyroid function studies: No results for input(s): "TSH", "T4TOTAL", "T3FREE", "THYROIDAB" in the last 72 hours.  Invalid input(s): "FREET3" Anemia work up: No results for input(s): "VITAMINB12", "FOLATE", "FERRITIN", "TIBC", "IRON", "RETICCTPCT" in the last 72 hours. Sepsis Labs: Recent Labs  Lab 04/26/23 0609  WBC 8.1   Microbiology No results found for this or any previous visit (from the past 240 hours).    Medications:    diltiazem  180 mg Oral Daily   docusate sodium  100 mg Oral BID   folic acid  1 mg Oral Daily   lactulose  30 g Oral TID   metoprolol succinate  50 mg Oral Daily   oxyCODONE  10 mg Oral Q12H   polyethylene glycol  17 g Oral Daily   senna  1 tablet Oral BID   sodium chloride flush  3-10 mL Intravenous Q12H   thiamine  100 mg Oral  Daily   Continuous Infusions:  DAPTOmycin 800 mg (04/30/23 1543)      LOS: 15 days   Marinda Elk  [facility]  05/01/2023, 9:53 AM

## 2023-05-02 ENCOUNTER — Other Ambulatory Visit (HOSPITAL_COMMUNITY): Payer: Self-pay

## 2023-05-02 DIAGNOSIS — R7881 Bacteremia: Secondary | ICD-10-CM | POA: Diagnosis not present

## 2023-05-02 DIAGNOSIS — F1910 Other psychoactive substance abuse, uncomplicated: Secondary | ICD-10-CM | POA: Diagnosis not present

## 2023-05-02 DIAGNOSIS — M00061 Staphylococcal arthritis, right knee: Secondary | ICD-10-CM | POA: Diagnosis not present

## 2023-05-02 DIAGNOSIS — B9562 Methicillin resistant Staphylococcus aureus infection as the cause of diseases classified elsewhere: Secondary | ICD-10-CM | POA: Diagnosis not present

## 2023-05-02 MED ORDER — ORITAVANCIN DIPHOSPHATE 400 MG IV SOLR
1200.0000 mg | Freq: Once | INTRAVENOUS | Status: AC
  Administered 2023-05-03: 1200 mg via INTRAVENOUS
  Filled 2023-05-02: qty 120

## 2023-05-02 MED ORDER — METHOCARBAMOL 750 MG PO TABS: 750.0000 mg | ORAL_TABLET | Freq: Three times a day (TID) | ORAL | 0 refills | Status: DC | PRN

## 2023-05-02 MED ORDER — OXYCODONE HCL 15 MG PO TABS: 15.0000 mg | ORAL_TABLET | ORAL | 0 refills | Status: DC | PRN

## 2023-05-02 NOTE — Progress Notes (Signed)
 [facility]  Date of Admission:  04/16/2023     Reason for follow-up: Right knee septic arthritis  Total days of antibiotics 16          ASSESSMENT:  Jonathan Ibarra is nearing completion of 2 weeks of daptomycin IV in the setting of MRSA bacteremia and septic arthritis of the right knee status post hardware removal.  Reviewed plan of care to include oritavancin tomorrow and will arrange for outpatient in 1 week at the infusion center.  Continue current dose of daptomycin with therapeutic drug monitoring of CK levels.  Not a candidate for PICC line.  Disposition remains to be determined.  Will place a social work consult to help with connecting with Medicaid resources.  Continue postsurgical management per orthopedics as needed.  Continue contact precautions for MRSA.  Will arrange follow-up in the ID office after infusions.  Remaining medical and supportive care per internal medicine team.    PLAN:  Continue current dose of daptomycin. Therapeutic drug monitoring of CK levels for duration of daptomycin treatment. Oritivancin tomorrow and then in 1 week with appointment at infusion center established. Continue contact precautions for MRSA. Social work consult to connect with resources with Medicaid. Follow-up ID office after infusions or sooner if needed.  Remaining medical and supportive care per internal medicine.  Principal Problem:   Septic joint of right knee joint (HCC) Active Problems:   Thrombocytopenia (HCC)   Essential hypertension   Hepatic cirrhosis (HCC)   Polysubstance abuse (HCC)   S/P TIPS (transjugular intrahepatic portosystemic shunt)   Atrial fibrillation, chronic (HCC)   MRSA bacteremia   Effusion of right knee joint   Bacteremia    diltiazem  180 mg Oral Daily   docusate sodium  100 mg Oral BID   folic acid  1 mg Oral Daily   lactulose  30 g Oral TID   metoprolol succinate  50 mg Oral Daily   oxyCODONE  10 mg Oral Q12H    polyethylene glycol  17 g Oral Daily   senna  1 tablet Oral BID   sodium chloride flush  3-10 mL Intravenous Q12H   thiamine  100 mg Oral Daily    SUBJECTIVE:  Jonathan Ibarra was last seen by the ID team on 04/22/2023 for septic arthritis of the right knee status post hardware removal and plan for 2 weeks of daptomycin and reassess for long-acting infusion. Jonathan Ibarra has been doing okay since our last follow-up and is receiving daptomycin as prescribed with no adverse side effects.  Has concerns about discharge and wanting to go home versus a rehab facility.  Willing to do what he needs to do to keep his leg.  Allergies  Allergen Reactions   Tylenol [Acetaminophen] Other (See Comments)    Impacts liver     Review of Systems: Review of Systems  Constitutional:  Negative for chills, fever and weight loss.  Respiratory:  Negative for cough, shortness of breath and wheezing.   Cardiovascular:  Negative for chest pain and leg swelling.  Gastrointestinal:  Negative for abdominal pain, constipation, diarrhea, nausea and vomiting.  Skin:  Negative for rash.      OBJECTIVE: Vitals:   05/02/23 0507 05/02/23 0510 05/02/23 0600 05/02/23 0746  BP: 123/78 123/78  132/82  Pulse: 70 (!) 122 93 82  Resp: 18 18    Temp: 99.5 F (37.5 C) 99.5 F (37.5 C)  97.9 F (36.6 C)  TempSrc:    Oral  SpO2: 100% 96%  99%  Weight:      Height:       Body mass index is 35.33 kg/m.  Physical Exam Constitutional:      General: He is not in acute distress.    Appearance: He is well-developed.     Comments: Lying in bed with head of bed elevated; pleasant  Cardiovascular:     Rate and Rhythm: Normal rate and regular rhythm.     Heart sounds: Normal heart sounds.  Pulmonary:     Effort: Pulmonary effort is normal.     Breath sounds: Normal breath sounds.  Skin:    General: Skin is warm and dry.  Neurological:     Mental Status: He is alert and oriented to person, place, and time.     Lab  Results Lab Results  Component Value Date   WBC 8.1 04/26/2023   HGB 11.4 (L) 04/26/2023   HCT 34.8 (L) 04/26/2023   MCV 90.2 04/26/2023   PLT 192 04/26/2023    Lab Results  Component Value Date   CREATININE 0.76 04/26/2023   BUN 10 04/26/2023   NA 136 04/26/2023   K 4.6 04/26/2023   CL 99 04/26/2023   CO2 32 04/26/2023    Lab Results  Component Value Date   ALT 18 04/17/2023   AST 19 04/17/2023   ALKPHOS 91 04/17/2023   BILITOT 4.1 (H) 04/17/2023     Microbiology: No results found for this or any previous visit (from the past 240 hours).   Jonathan Eke, NP [facility]  05/02/2023  3:47 PM

## 2023-05-02 NOTE — Progress Notes (Signed)
 [facility] PROGRESS NOTE    Progress Note  Jonathan Ibarra  AVW:[id number] DOB: [birth year] DOA: 04/16/2023 PCP: Candi Leash, MD     Brief Narrative:   Jonathan Ibarra is an 58 y.o. male past medical history of cirrhosis, status post procedure, essential hypertension, polysubstance abuse, SVT paroxysmal atrial fibrillation not a candidate for anticoagulation, with a recent plateau fracture requiring ORIF in October 2024, discharged on 02/04/2023 for purulent discharge from the right knee, it was believe that he had postoperative septic arthritis patient was managed with IV Invanz seen by ID and discharged home on omadacycline, patient has undergone I&D of the right knee 3 times as an outpatient, did not follow-up with orthopedic surgery on his last appointment presenting to the ED with worsening swelling and pain with purulent discharge right knee x-ray showed a right large effusion soft tissue edema orthopedic surgery and infectious disease were consulted, blood cultures were positive for MRSA, underwent I&D with hardware removal on 04/20/2023 underwent TEE on 04/21/2023 was negative for endocarditis.  Assessment/Plan:   Septic joint of right knee joint and hardware/MRSA bacteremia: History of multiple I&D's and outpatient will complete his treatment as an outpatient. Came back to the ED orthopedic surgery was consulted he status post hardware removal and I&D on 04/20/2023.   Wound VAC was placed. Blood cultures grew MRSA, TEE was negative for endocarditis. Infectious disease was consulted ,Surveillance blood culture on 04/18/2023 remain negative. He is a poor candidate for PICC line, infectious disease recommended to continue IV daptomycin for 3 weeks, end date of treatment 05/03/2023.   Then he will need to go to SNF. Call ID on the day of discharge for follow-up appointment. Complaining of pain this morning started on long-acting oxycodone continue short acting and Dilaudid for  breakthrough.  Chronic atrial fibrillation: Not a candidate for anticoagulation. Continue metoprolol and Cardizem.  Liver cirrhosis status post TIPS/thrombocytopenia/: Continue lactulose.  Polysubstance abuse: Continue counseling.  Normocytic anemia: No signs of overt blood loss. Hemoglobin is relatively stable.  Obesity: Noted.  Essential hypertension Currently on no antihypertensive medication, relatively well-controlled.  Polysubstance abuse (HCC) Noted.    DVT prophylaxis: lovenox Family Communication:none Status is: Inpatient Remains inpatient appropriate because: Septic knee    Code Status:     Code Status Orders  (From admission, onward)           Start     Ordered   04/16/23 1554  Full code  Continuous       Question:  By:  Answer:  Consent: discussion documented in EHR   04/16/23 1555           Code Status History     Date Active Date Inactive Code Status Order ID Comments User Context   12/28/2022 0822 02/04/2023 2234 Full Code [id number]  Lajoyce Corners, NP ED   12/13/2022 0336 12/17/2022 2206 Full Code [id number]  Dolly Rias, MD ED   10/01/2022 0408 10/08/2022 1755 Full Code [id number]  Carollee Herter, DO ED   10/01/2022 0345 10/01/2022 0408 Full Code [id number]  Carollee Herter, DO ED   04/23/2017 0001 04/28/2017 2028 Full Code [id number]  Hillary Bow, DO ED   02/04/2015 1730 02/07/2015 1519 Full Code [id number]  Alison Murray, MD Inpatient         IV Access:   Peripheral IV   Procedures and diagnostic studies:   No results found.   Medical Consultants:   None.   Subjective:    Jonathan Ibarra relates  his pain is controlled.  Objective:    Vitals:   05/02/23 0507 05/02/23 0510 05/02/23 0600 05/02/23 0746  BP: 123/78 123/78  132/82  Pulse: 70 (!) 122 93 82  Resp: 18 18    Temp: 99.5 F (37.5 C) 99.5 F (37.5 C)  97.9 F (36.6 C)  TempSrc:    Oral  SpO2: 100% 96%  99%  Weight:      Height:       SpO2: 99 % O2 Flow  Rate (L/min): 0 L/min   Intake/Output Summary (Last 24 hours) at 05/02/2023 0825 Last data filed at 05/02/2023 0500 Gross per 24 hour  Intake 243 ml  Output 1450 ml  Net -1207 ml   Filed Weights   04/16/23 1819  Weight: 118.2 kg    Exam: General exam: In no acute distress. Respiratory system: Good air movement and clear to auscultation. Cardiovascular system: S1 & S2 heard, RRR. No JVD. Gastrointestinal system: Abdomen is nondistended, soft and nontender.  Extremities: No pedal edema. Skin: No rashes, lesions or ulcers Psychiatry: Judgement and insight appear normal. Mood & affect appropriate. Data Reviewed:    Labs: Basic Metabolic Panel: Recent Labs  Lab 04/26/23 0609  NA 136  K 4.6  CL 99  CO2 32  GLUCOSE 117*  BUN 10  CREATININE 0.76  CALCIUM 8.6*   GFR Estimated Creatinine Clearance: 136.8 mL/min (by C-G formula based on SCr of 0.76 mg/dL). Liver Function Tests: No results for input(s): "AST", "ALT", "ALKPHOS", "BILITOT", "PROT", "ALBUMIN" in the last 168 hours. No results for input(s): "LIPASE", "AMYLASE" in the last 168 hours. No results for input(s): "AMMONIA" in the last 168 hours. Coagulation profile No results for input(s): "INR", "PROTIME" in the last 168 hours.  COVID-19 Labs  No results for input(s): "DDIMER", "FERRITIN", "LDH", "CRP" in the last 72 hours.   Lab Results  Component Value Date   SARSCOV2NAA NEGATIVE 09/30/2022    CBC: Recent Labs  Lab 04/26/23 0609  WBC 8.1  NEUTROABS 6.6  HGB 11.4*  HCT 34.8*  MCV 90.2  PLT 192   Cardiac Enzymes: Recent Labs  Lab 04/26/23 0609  CKTOTAL 19*   BNP (last 3 results) No results for input(s): "PROBNP" in the last 8760 hours. CBG: No results for input(s): "GLUCAP" in the last 168 hours. D-Dimer: No results for input(s): "DDIMER" in the last 72 hours. Hgb A1c: No results for input(s): "HGBA1C" in the last 72 hours. Lipid Profile: No results for input(s): "CHOL", "HDL", "LDLCALC",  "TRIG", "CHOLHDL", "LDLDIRECT" in the last 72 hours. Thyroid function studies: No results for input(s): "TSH", "T4TOTAL", "T3FREE", "THYROIDAB" in the last 72 hours.  Invalid input(s): "FREET3" Anemia work up: No results for input(s): "VITAMINB12", "FOLATE", "FERRITIN", "TIBC", "IRON", "RETICCTPCT" in the last 72 hours. Sepsis Labs: Recent Labs  Lab 04/26/23 0609  WBC 8.1   Microbiology No results found for this or any previous visit (from the past 240 hours).    Medications:    diltiazem  180 mg Oral Daily   docusate sodium  100 mg Oral BID   folic acid  1 mg Oral Daily   lactulose  30 g Oral TID   metoprolol succinate  50 mg Oral Daily   oxyCODONE  10 mg Oral Q12H   polyethylene glycol  17 g Oral Daily   senna  1 tablet Oral BID   sodium chloride flush  3-10 mL Intravenous Q12H   thiamine  100 mg Oral Daily   Continuous Infusions:  DAPTOmycin 800 mg (05/01/23 1844)      LOS: 16 days   Marinda Elk  [facility]  05/02/2023, 8:25 AM

## 2023-05-02 NOTE — Progress Notes (Addendum)
 Orthopaedic Trauma Progress Note  SUBJECTIVE: Doing ok this morning. Wants his wound vac taken off. Pain is present but manageable.  Notes that he has been mobilizing around the room with his walker, notes significant improvement in his mobility compared to prior to admission.  Moving the knee much better as well.  Patient would prefer to go home versus SNF.  OBJECTIVE:  Vitals:   05/02/23 0600 05/02/23 0746  BP:  132/82  Pulse: 93 82  Resp:    Temp:  97.9 F (36.6 C)  SpO2:  99%    General: Sitting up on edge of bed.  No acute distress  respiratory: No increased work of breathing.  Right lower extremity: Incisional VAC removed.  200 mL serosanguineous fluid noted in wound VAC canister.  Incision as below.  Removed the 2 most proximal and 2 most distal sutures.  All other sutures left in place.  Swelling through the calf present but stable.  Tolerates gentle flexion of the knee.  Compartments soft and compressible.   2+ DP pulse   IMAGING: Intraoperative imaging confirmed removal of all hardware   LABS:  No results found for this or any previous visit (from the past 24 hours).   ASSESSMENT: Jonathan Ibarra is a 58 y.o. male 1 day postop s/p  HARDWARE REMOVAL RIGHT TIBIAL PLATEAU IRRIGATION DEBRIDEMENT RIGHT KNEE  CV/Blood loss: Acute blood loss anemia.  Hemoglobin 11.4 on 04/26/2023.  Hemodynamically stable  PLAN: Weightbearing: WBAT RLE ROM: Motion as tolerated Incisional and dressing care: Wound VAC removed today.  Dry dressing applied.  Continue to change dressing daily with Adaptic, 4 x 4's, ABD, Kerlix, Ace wrap Showering: Okay to shower with incision covered Orthopedic device(s): None Pain management: Continue multimodal pain control  VTE prophylaxis: SCDs ID: Per infectious disease team Foley/Lines:  No foley, KVO IVFs Impediments to Fracture Healing: Infection Dispo: PT/OT as able.  Continue antibiotics per infectious disease.  No further surgeries planned from  orthopedic standpoint.  I have signed and placed discharge Rx for pain medication and muscle relaxer in patient's chart in anticipation of SNF.  If he discharges home instead, I will electronically send Rx to [facility] pharmacy  Follow - up plan: 1-2 weeks after discharge for wound check and repeat x-rays   Contact information:  Jonathan Merle MD, Jonathan Breed PA-C. After hours and holidays please check [email or web address] for group call information for [facility]   Jonathan Caul, PA-C [phone] (office) [email or web address]

## 2023-05-02 NOTE — Progress Notes (Signed)
 Physical Therapy Treatment Patient Details Name: Jonathan Ibarra MRN: [id number] DOB: 02/01/1966 Today's Date: 05/02/2023   History of Present Illness Pt is a 58 yr old male who presented on 04/16/23 due to increase in edema and pain in r knee following a fall. Pt s/p 04/20/23 removal of hardware, I & D of R knee. Pt R LE WBAT with wound vac. PMH: liver cirrhosis, s/p TIPS, HTN, polysubstance abuse, afib, R plateau fx requiring ORIF on 12/13/22 and has had irrigation and debridement of R knee in OP setting    PT Comments  Patient seated EOB at start of session, agreeable to work with therapy and motivated to return home. Pt completing bed mob and transfers with RW at mod ind level. Pt with good recall to maintain safe position to RW throughout gait, occasionally lifting walker from floor but corrected after 1x cue. Pt negotiated stairs with step to pattern "up with good, down with bad" and no overt LOB noted. Pt fatiguing and seated rest provided prior to ambulating back to room and returning to supine for rest. Will continue to progress pt as able during acute stay.     If plan is discharge home, [name];[name];Assist for transportation;Help with stairs or ramp for entrance   Can travel by private vehicle     Yes  Equipment Recommendations  Rolling walker (2 wheels);Gilmer Mor (pt wants a SPC, reports has  RW at home (unsure if this is accurate) RW is safer for mobility at this time)    Recommendations for Other Services       Precautions / Restrictions Precautions Precautions: Fall Precaution/Restrictions Comments: wound vac RLE Restrictions Weight Bearing Restrictions Per Provider Order: Yes RLE Weight Bearing Per Provider Order: Weight bearing as tolerated     Mobility  Bed Mobility Overal bed mobility: Modified Independent Bed Mobility: Supine to Sit, Sit to Supine     Supine to  sit: Modified independent (Device/Increase time) Sit to supine: Modified independent (Device/Increase time)        Transfers Overall transfer level: Needs assistance Equipment used: Rolling walker (2 wheels) Transfers: Sit to/from Stand Sit to Stand: Modified independent (Device/Increase time)           General transfer comment: no cues needed, Mod Ind with RW    Ambulation/Gait Ambulation/Gait assistance: Supervision Gait Distance (Feet): 120 Feet Assistive device: Rolling walker (2 wheels), Straight cane Gait Pattern/deviations: Step-through pattern, Decreased step length - right, Decreased step length - left, Decreased weight shift to right, Trunk flexed Gait velocity: fair     General Gait Details: sup for safety, pt maintianed safe position to RW.   Stairs Stairs: Yes Stairs assistance: Supervision Stair Management: Two rails, Step to pattern, Forwards Number of Stairs: 7 General stair comments: pt recalled safe step pattern and completed step-to "up with good down with bad". no buckling or LOB noted.   Wheelchair Mobility     Tilt Bed    Modified Rankin (Stroke Patients Only)       Balance Overall balance assessment: Needs assistance Sitting-balance support: [name], During functional activity, Reliant on assistive device for balance Standing balance-Leahy Scale: Fair Standing balance comment: reliant on device/external support  Communication Communication Communication: No apparent difficulties  Cognition Arousal: Alert Behavior During Therapy: WFL for tasks assessed/performed   PT - Cognitive impairments: No apparent impairments                         Following commands: Intact Following commands impaired: Follows multi-step commands inconsistently (suspect personality impacts this)     Cueing Cueing Techniques: Verbal cues  Exercises      General Comments        Pertinent Vitals/Pain Pain Assessment Pain Assessment: Faces Faces Pain Scale: Hurts even more Pain Location: Rt knee Pain Descriptors / Indicators: Aching, Grimacing, Discomfort, Moaning Pain Intervention(s): Monitored during session, Limited activity within patient's tolerance, Repositioned, Patient requesting pain meds-RN notified    Home Living                          Prior Function            PT Goals (current goals can now be found in the care plan section) Acute Rehab PT Goals Patient Stated Goal: to improve mobility and return home. PT Goal Formulation: With patient Time For Goal Achievement: 05/05/23 Potential to Achieve Goals: Good Progress towards PT goals: Progressing toward goals    Frequency    Min 1X/week      PT Plan      Co-evaluation              AM-PAC PT "6 Clicks" Mobility   Outcome Measure  Help needed turning from your back to your side while in a flat bed without using bedrails?: None Help needed moving from lying on your back to sitting on the side of a flat bed without using bedrails?: None Help needed moving to and from a bed to a chair (including a wheelchair)?: A Little Help needed standing up from a chair using your arms (e.g., wheelchair or bedside chair)?: A Little Help needed to walk in hospital room?: A Little Help needed climbing 3-5 steps with a railing? : A Little 6 Click Score: 20    End of Session Equipment Utilized During Treatment: Gait belt Activity Tolerance: Patient tolerated treatment well;Patient limited by pain Patient left: in bed;with call bell/phone within reach Nurse Communication: Mobility status PT Visit Diagnosis: Other abnormalities of gait and mobility (R26.89);Muscle weakness (generalized) (M62.81);Pain;Difficulty in walking, not elsewhere classified (R26.2) Pain - Right/Left: Right Pain - part of body:  Knee     Time: 4403-4742 PT Time Calculation (min) (ACUTE ONLY): 23 min  Charges:    $Gait Training: 23-37 mins PT General Charges $$ ACUTE PT VISIT: 1 Visit                     Wynn Maudlin, DPT Acute Rehabilitation Services Office [phone]  05/02/23 12:02 PM

## 2023-05-02 NOTE — Progress Notes (Signed)
 Occupational Therapy Treatment Patient Details Name: Jonathan Ibarra MRN: [id number] DOB: 04/30/1965 Today's Date: 05/02/2023   History of present illness Pt is a 58 yr old male who presented on 04/16/23 due to increase in edema and pain in r knee following a fall. Pt s/p 04/20/23 removal of hardware, I & D of R knee. Pt R LE WBAT with wound vac. PMH: liver cirrhosis, s/p TIPS, HTN, polysubstance abuse, afib, R plateau fx requiring ORIF on 12/13/22 and has had irrigation and debridement of R knee in OP setting   OT comments  Pt in restroom on arrival and agreeable to work with OT. Pt educated regarding compensatory techniques for tub transfers with RW use and seat. Pt needing cues for optimal sequence and to avoid any pivoting on R knee. Pt and OT reviewed use of AE for LB ADL and pt with good demo. Pt continues to benefit from skilled OT services.       If plan is discharge home, recommend the following:  [name];Two people to help with bathing/dressing/bathroom;Assistance with cooking/housework;Assistance with feeding;Assist for transportation   Equipment Recommendations  Tub/shower bench    Recommendations for Other Services      Precautions / Restrictions Precautions Precautions: Fall Restrictions Weight Bearing Restrictions Per Provider Order: Yes RLE Weight Bearing Per Provider Order: Weight bearing as tolerated       Mobility Bed Mobility Overal bed mobility: Modified Independent                  Transfers Overall transfer level: Needs assistance Equipment used: Rolling walker (2 wheels) Transfers: Sit to/from Stand Sit to Stand: Modified independent (Device/Increase time)           General transfer comment: no cues needed, Mod Ind with RW     Balance Overall balance assessment: Needs assistance Sitting-balance support: Feet supported Sitting balance-Leahy Scale: Good     Standing balance support: Bilateral upper  extremity supported, During functional activity, Reliant on assistive device for balance Standing balance-Leahy Scale: Fair Standing balance comment: reliant on device/external support                           ADL either performed or assessed with clinical judgement   ADL Overall ADL's : Needs assistance/impaired     Grooming: Standing;Wash/dry hands;Modified independent               Lower Body Dressing: Supervision/safety;Sit to/from stand;With adaptive equipment Lower Body Dressing Details (indicate cue type and reason): good demo of use of LB AE Toilet Transfer: Modified Independent;Ambulation       Tub/ Shower Transfer: Tub transfer;Contact guard assist;Ambulation          Extremity/Trunk Assessment Upper Extremity Assessment Upper Extremity Assessment: Overall WFL for tasks assessed   Lower Extremity Assessment Lower Extremity Assessment: Defer to PT evaluation        Vision   Vision Assessment?: No apparent visual deficits   Perception     Praxis     Communication Communication Communication: No apparent difficulties   Cognition Arousal: Alert Behavior During Therapy: WFL for tasks assessed/performed Cognition: No family/caregiver present to determine baseline             OT - Cognition Comments: oriented and follows all commands; good recall of conversations with MD this morning.                 Following commands: Intact Following commands impaired:  Follows multi-step commands inconsistently (suspect perfonality impacts this)      Cueing   Cueing Techniques: Verbal cues  Exercises      Shoulder Instructions       General Comments      Pertinent Vitals/ Pain       Pain Assessment Pain Assessment: Faces Faces Pain Scale: Hurts even more Pain Location: Rt knee Pain Descriptors / Indicators: Aching, Grimacing, Discomfort, Moaning Pain Intervention(s): Limited activity within patient's tolerance, Monitored during  session  Home Living                                          Prior Functioning/Environment              Frequency  Min 1X/week        Progress Toward Goals  OT Goals(current goals can now be found in the care plan section)  Progress towards OT goals: Progressing toward goals  Acute Rehab OT Goals Patient Stated Goal: get better and go home OT Goal Formulation: With patient Time For Goal Achievement: 05/05/23 Potential to Achieve Goals: Good ADL Goals Pt Will Perform Grooming: with modified independence;sitting;standing Pt Will Perform Upper Body Bathing: with modified independence;sitting;standing Pt Will Perform Lower Body Bathing: with modified independence;sit to/from stand Pt Will Transfer to Toilet: with modified independence;ambulating Pt Will Perform Toileting - Clothing Manipulation and hygiene: with modified independence;sit to/from stand Pt Will Perform Tub/Shower Transfer: with modified independence;ambulating;rolling walker  Plan      Co-evaluation                 AM-PAC OT "6 Clicks" Daily Activity     Outcome Measure   Help from another person eating meals?: None Help from another person taking care of personal grooming?: A Little Help from another person toileting, which includes using toliet, bedpan, or urinal?: A Little Help from another person bathing (including washing, rinsing, drying)?: A Little Help from another person to put on and taking off regular upper body clothing?: A Little Help from another person to put on and taking off regular lower body clothing?: A Little 6 Click Score: 19    End of Session Equipment Utilized During Treatment: Gait belt;Rolling walker (2 wheels)  OT Visit Diagnosis: Unsteadiness on feet (R26.81);Other abnormalities of gait and mobility (R26.89);Repeated falls (R29.6);Muscle weakness (generalized) (M62.81);Pain Pain - Right/Left: Right Pain - part of body: Knee   Activity Tolerance  Patient tolerated treatment well   Patient Left in bed;with call bell/phone within reach   Nurse Communication Mobility status        Time: 4782-9562 OT Time Calculation (min): 31 min  Charges: OT General Charges $OT Visit: 1 Visit OT Treatments $Self Care/Home Management : 23-37 mins  Tyler Deis, OTR/L [facility] Acute Rehabilitation Office: [phone]   Myrla Halsted 05/02/2023, 1:23 PM

## 2023-05-03 ENCOUNTER — Encounter (HOSPITAL_COMMUNITY): Payer: Self-pay | Admitting: Internal Medicine

## 2023-05-03 ENCOUNTER — Other Ambulatory Visit (HOSPITAL_COMMUNITY): Payer: Self-pay

## 2023-05-03 DIAGNOSIS — M00061 Staphylococcal arthritis, right knee: Secondary | ICD-10-CM | POA: Diagnosis not present

## 2023-05-03 LAB — SEDIMENTATION RATE: Sed Rate: 55 mm/h — ABNORMAL HIGH (ref 0–16)

## 2023-05-03 LAB — CBC WITH DIFFERENTIAL/PLATELET
Abs Immature Granulocytes: 0.02 10*3/uL (ref 0.00–0.07)
Basophils Absolute: 0 10*3/uL (ref 0.0–0.1)
Basophils Relative: 1 %
Eosinophils Absolute: 0.1 10*3/uL (ref 0.0–0.5)
Eosinophils Relative: 3 %
HCT: 29 % — ABNORMAL LOW (ref 39.0–52.0)
Hemoglobin: 9.5 g/dL — ABNORMAL LOW (ref 13.0–17.0)
Immature Granulocytes: 1 %
Lymphocytes Relative: 12 %
Lymphs Abs: 0.5 10*3/uL — ABNORMAL LOW (ref 0.7–4.0)
MCH: 29.7 pg (ref 26.0–34.0)
MCHC: 32.8 g/dL (ref 30.0–36.0)
MCV: 90.6 fL (ref 80.0–100.0)
Monocytes Absolute: 0.6 10*3/uL (ref 0.1–1.0)
Monocytes Relative: 14 %
Neutro Abs: 2.8 10*3/uL (ref 1.7–7.7)
Neutrophils Relative %: 69 %
Platelets: 160 10*3/uL (ref 150–400)
RBC: 3.2 MIL/uL — ABNORMAL LOW (ref 4.22–5.81)
RDW: 15 % (ref 11.5–15.5)
WBC: 4 10*3/uL (ref 4.0–10.5)
nRBC: 0 % (ref 0.0–0.2)

## 2023-05-03 LAB — BASIC METABOLIC PANEL
Anion gap: 4 — ABNORMAL LOW (ref 5–15)
BUN: 17 mg/dL (ref 6–20)
CO2: 27 mmol/L (ref 22–32)
Calcium: 8.4 mg/dL — ABNORMAL LOW (ref 8.9–10.3)
Chloride: 105 mmol/L (ref 98–111)
Creatinine, Ser: 0.66 mg/dL (ref 0.61–1.24)
GFR, Estimated: >60 mL/min (ref >60)
Glucose, Bld: 101 mg/dL — ABNORMAL HIGH (ref 70–99)
Potassium: 4 mmol/L (ref 3.5–5.1)
Sodium: 136 mmol/L (ref 135–145)

## 2023-05-03 LAB — C-REACTIVE PROTEIN: CRP: 0.8 mg/dL (ref <1.0)

## 2023-05-03 MED ORDER — OXYCODONE HCL 15 MG PO TABS
15.0000 mg | ORAL_TABLET | ORAL | 0 refills | Status: DC | PRN
  Filled 2023-05-03: qty 20, 5d supply, fill #0

## 2023-05-03 MED ORDER — METHOCARBAMOL 750 MG PO TABS
750.0000 mg | ORAL_TABLET | Freq: Three times a day (TID) | ORAL | 0 refills | Status: DC | PRN
  Filled 2023-05-03: qty 30, 10d supply, fill #0

## 2023-05-03 MED ORDER — METOPROLOL SUCCINATE ER 50 MG PO TB24
50.0000 mg | ORAL_TABLET | Freq: Every day | ORAL | 1 refills | Status: AC
  Filled 2023-05-03: qty 30, 30d supply, fill #0

## 2023-05-03 MED ORDER — DILTIAZEM HCL ER COATED BEADS 180 MG PO CP24
180.0000 mg | ORAL_CAPSULE | Freq: Every day | ORAL | 0 refills | Status: DC
  Filled 2023-05-03: qty 30, 30d supply, fill #0

## 2023-05-03 NOTE — Progress Notes (Signed)
 Physical Therapy Treatment Patient Details Name: Jonathan Ibarra MRN: [id number] DOB: 1966/01/24 Today's Date: 05/03/2023   History of Present Illness Pt is a 58 yr old male who presented on 04/16/23 due to increase in edema and pain in R knee following a fall. Pt s/p 04/20/23 removal of hardware, I & D of R knee. Pt R LE WBAT with wound vac. PMH: liver cirrhosis, s/p TIPS, HTN, polysubstance abuse, afib, R plateau fx requiring ORIF on 12/13/22 and has had irrigation and debridement of R knee in OP setting.    PT Comments  Pt received standing at bedside without socks donned and not close to RW and on opposite side of bed from his IV pole, pt c/o severe R knee pain (RN notified) and reluctant to participate in gait or stair training after getting up to use the bathroom. Pt able to perform 7" platform step in his room x3 reps with RW support and Supervision for safety, no buckling or LOB. Pt instructed on importance of using call bell prior to getting up due to IV pole and pt decreased safety/increased risk of falls with his high pain score, pt will need reinforcement. RN/case mgmt/social worker notified that pt is requesting shoes (size 13) and clothing (pants/shirt) as he does not have personal clothing to wear for discharge home today. Pt continues to benefit from PT services to progress toward functional mobility goals.    If plan is discharge home, [name];[name];Assist for transportation;Help with stairs or ramp for entrance   Can travel by private vehicle     Yes  Equipment Recommendations  Rolling walker (2 wheels);Gilmer Mor (pt wants a SPC, reports has RW at home (unsure if this is accurate) RW is safer for mobility at this time)    Recommendations for Other Services       Precautions / Restrictions Precautions Precautions: Fall Recall of Precautions/Restrictions:  Impaired Precaution/Restrictions Comments: pt observed to be walking in room wtihout socks on, without staying close to RW, trying to manage his IV and RW on his own. Pt encouraged to use call bell prior to OOB and bed alarm on for his safety Restrictions Weight Bearing Restrictions Per Provider Order: Yes RLE Weight Bearing Per Provider Order: Weight bearing as tolerated     Mobility  Bed Mobility Overal bed mobility: Modified Independent       Supine to sit: Modified independent (Device/Increase time) Sit to supine: Modified independent (Device/Increase time)   General bed mobility comments: pt aware of lines    Transfers Overall transfer level: Needs assistance Equipment used: Rolling walker (2 wheels) Transfers: Sit to/from Stand Sit to Stand: Modified independent (Device/Increase time)           General transfer comment: no cues needed, Mod Ind with RW. Just needs cues to scoot higher toward [facility] prior to return to supine so his back is in the right portion of mattress/room for RLE to be elevated at rest, but no actual cues on technique needed.    Ambulation/Gait Ambulation/Gait assistance: Supervision Gait Distance (Feet): 5 Feet Assistive device: Rolling walker (2 wheels), Straight cane Gait Pattern/deviations: Step-to pattern, Antalgic       General Gait Details: pt stepping around EOB but not close to RW, needs cues for better proximity as PTA arrived to room, pt significantly guarding/antalgic due to c/o R knee pain, RN notified. Pt states pain is worse while performing stairs.   Stairs Stairs:  Yes Stairs assistance: Supervision Stair Management: Step to pattern, Forwards, Backwards, With walker Number of Stairs: 3 General stair comments: pt recalled safe step pattern and completed step-to "up with good down with bad". no buckling or LOB noted, supervision for safety and line mgmt. 7" step in his room as pt states RLE too painful to walk to PT gym ~51ft  away.   Wheelchair Mobility     Tilt Bed    Modified Rankin (Stroke Patients Only)       Balance Overall balance assessment: Needs assistance Sitting-balance support: [name], During functional activity, Reliant on assistive device for balance Standing balance-Leahy Scale: Fair Standing balance comment: reliant on RW, unsteady without AD due to R knee pain                            Communication Communication Communication: No apparent difficulties  Cognition Arousal: Alert Behavior During Therapy: WFL for tasks assessed/performed   PT - Cognitive impairments: Safety/Judgement                       PT - Cognition Comments: Pt observed to be moving in his room unsafely (no socks donned, pt not close to RW, IV on side of bed opposite him), pt instructed on importance of call bell use prior to getting up so staff can assist him since he is at increased risk of falls with how painful his RLE is. Following commands: Intact Following commands impaired: Follows multi-step commands inconsistently (suspect personality impacts this)    Cueing Cueing Techniques: Verbal cues  Exercises Other Exercises Other Exercises: pt defers at this time due to pain    General Comments General comments (skin integrity, edema, etc.): RLE elevated in supine at end of session with heel floated, pt given ice packs x2 to surround his R knee joint (velcro straps to keep them in place), bed alarm placed for his safety. Pt states he plans to take a nap.      Pertinent Vitals/Pain Pain Assessment Pain Assessment: 0-10 Pain Score: 9  Pain Location: Rt knee Pain Descriptors / Indicators: Aching, Grimacing, Discomfort, Moaning, Crying, Tingling, Sharp Pain Intervention(s): Limited activity within patient's tolerance, Monitored during session, Premedicated before session,  Repositioned, Patient requesting pain meds-RN notified, Ice applied (RLE elevated in supine with R heel floated)    Home Living                          Prior Function            PT Goals (current goals can now be found in the care plan section) Acute Rehab PT Goals Patient Stated Goal: to improve mobility and return home. PT Goal Formulation: With patient Time For Goal Achievement: 05/05/23 Progress towards PT goals: Progressing toward goals    Frequency    Min 1X/week      PT Plan      Co-evaluation              AM-PAC PT "6 Clicks" Mobility   Outcome Measure  Help needed turning from your back to your side while in a flat bed without using bedrails?: None Help needed moving from lying on your back to sitting on the side of a flat bed without using bedrails?: None Help needed moving to and from a  bed to a chair (including a wheelchair)?: A Little Help needed standing up from a chair using your arms (e.g., wheelchair or bedside chair)?: None Help needed to walk in hospital room?: A Little Help needed climbing 3-5 steps with a railing? : A Little 6 Click Score: 21    End of Session Equipment Utilized During Treatment: Gait belt (gt belt brought to room for him to use at home) Activity Tolerance: Patient limited by pain Patient left: in bed;with call bell/phone within reach;with bed alarm set;Other (comment) (RLE elevated, ice over R knee) Nurse Communication: Mobility status;Patient requests pain meds;Other (comment) (case mgmt/LCSW notified pt asking for shoes and clothing to wear for home) PT Visit Diagnosis: Other abnormalities of gait and mobility (R26.89);Muscle weakness (generalized) (M62.81);Pain;Difficulty in walking, not elsewhere classified (R26.2) Pain - Right/Left: Right Pain - part of body: Knee     Time: 1610-9604 PT Time Calculation (min) (ACUTE ONLY): 18 min  Charges:    $Therapeutic Activity: 8-22 mins PT General Charges $$  ACUTE PT VISIT: 1 Visit                     Cynai Skeens P., PTA Acute Rehabilitation Services Secure Chat Preferred 9a-5:30pm Office: [phone]    [facility] 05/03/2023, 1:53 PM

## 2023-05-03 NOTE — Discharge Summary (Signed)
 Physician Discharge Summary  Jonathan Ibarra ZOX:[id number] DOB: 10-26-1965 DOA: 04/16/2023  PCP: Candi Leash, MD  Admit date: 04/16/2023 Discharge date: 05/03/2023  Admitted From: Home Disposition:  Home  Recommendations for Outpatient Follow-up:  Follow up with PCP in 1-2 weeks Please obtain BMP/CBC in one week   Home Health:Yes Equipment/Devices:None  Discharge Condition:Stable CODE STATUS:Full Diet recommendation: Heart Healthy   Brief/Interim Summary: 58 y.o. male past medical history of cirrhosis, status post procedure, essential hypertension, polysubstance abuse, SVT paroxysmal atrial fibrillation not a candidate for anticoagulation, with a recent plateau fracture requiring ORIF in October 2024, discharged on 02/04/2023 for purulent discharge from the right knee, it was believe that he had postoperative septic arthritis patient was managed with IV Invanz seen by ID and discharged home on omadacycline, patient has undergone I&D of the right knee 3 times as an outpatient, did not follow-up with orthopedic surgery on his last appointment presenting to the ED with worsening swelling and pain with purulent discharge right knee x-ray showed a right large effusion soft tissue edema orthopedic surgery and infectious disease were consulted, blood cultures were positive for MRSA, underwent I&D with hardware removal on 04/20/2023 underwent TEE on 04/21/2023 was negative for endocarditis.   Discharge Diagnoses:  Principal Problem:   Septic joint of right knee joint (HCC) Active Problems:   Thrombocytopenia (HCC)   Essential hypertension   Hepatic cirrhosis (HCC)   Polysubstance abuse (HCC)   S/P TIPS (transjugular intrahepatic portosystemic shunt)   Atrial fibrillation, chronic (HCC)   MRSA bacteremia   Effusion of right knee joint   Bacteremia  Septic joint of the right knee and hardware/MRSA bacteremia With a history of multiple I&D's and outpatient. Orthopedic surgery was  consulted when he came into the ED he status post hardware removal and I&D on 04/20/2023. Wound VAC was placed. Blood cultures grew MRSA ID was consulted TEE was negative for endocarditis. Surveillance blood cultures repeated on 04/18/2023 remain negative till date. ID deemed a poor candidate for PICC line he finishes antibiotic treatment IV vancomycin on 05/02/2020 at 5. PT evaluated the patient will need home health PT. ID gave him a dose of Oritivancin and then in 1 week we will follow-up at the infusion center. He will follow-up with ID clinic in 1 to 2 weeks.  Chronic atrial fibrillation: Rate controlled metoprolol and Cardizem. Not a candidate for anticoagulation.  History of cirrhosis status post TIPS/thrombocytopenia: Continue lactulose.  Polysubstance abuse: Has been counseled.  Normocytic anemia: Hemoglobin remained relatively stable.  Obesity: Noted.  Essential hypertension : currently on no antihypertensive medication  Discharge Instructions  Discharge Instructions     Diet - low sodium heart healthy   Complete by: As directed    Increase activity slowly   Complete by: As directed    No wound care   Complete by: As directed       Allergies as of 05/03/2023       Reactions   Tylenol [acetaminophen] Other (See Comments)   Impacts liver        Medication List     STOP taking these medications    metoprolol tartrate 25 MG tablet Commonly known as: LOPRESSOR   midodrine 10 MG tablet Commonly known as: PROAMATINE       TAKE these medications    Constulose 10 GM/15ML solution Generic drug: lactulose Take 30 mLs (20 g total) by mouth 3 (three) times daily.   diltiazem 180 MG 24 hr capsule Commonly known as: CARDIZEM CD Take  1 capsule (180 mg total) by mouth daily. Start taking on: May 04, 2023   folic acid 1 MG tablet Commonly known as: FOLVITE Take 1 tablet (1 mg total) by mouth daily.   methocarbamol 750 MG tablet Commonly known as:  ROBAXIN Take 1 tablet (750 mg total) by mouth every 8 (eight) hours as needed for muscle spasms.   metoprolol succinate 50 MG 24 hr tablet Commonly known as: TOPROL-XL Take 1 tablet (50 mg total) by mouth daily. Take with or immediately following a meal. Start taking on: May 04, 2023   oxyCODONE 15 MG immediate release tablet Commonly known as: ROXICODONE Take 1 tablet (15 mg total) by mouth every 4 (four) hours as needed for severe pain (pain score 7-10). What changed:  medication strength how much to take when to take this reasons to take this   thiamine 100 MG tablet Commonly known as: VITAMIN B1 Take 1 tablet (100 mg total) by mouth daily.   Vitamin D (Ergocalciferol) 1.25 MG (50000 UNIT) Caps capsule Commonly known as: DRISDOL Take 1 capsule (50,000 Units total) by mouth every 7 (seven) days.               Durable Medical Equipment  (From admission, onward)           Start     Ordered   04/28/23 1601  For home use only DME Tub bench  Once       Comments: Tub/shower bench   04/28/23 1603   04/28/23 1557  For home use only DME Walker rolling  Once       Question Answer Comment  Walker: With 5 Inch Wheels   Patient needs a walker to treat with the following condition Gait instability      04/28/23 1603            Follow-up Information     Candi Leash, MD Follow up.   Specialty: Family Medicine Why: Home health services will be provided by [facility], start of care within 48 hours post discharge Contact information: [address] [place] [address] [phone]         Haddix, Gillie Manners, MD. Schedule an appointment as soon as possible for a visit.   Specialty: Orthopedic Surgery Why: 1-2 weeks for wound check and suture removal Contact information: [address] [place] [address] [phone]         Odette Fraction, MD Follow up.   Specialty: Infectious Diseases Why: 05/19/23 at 9:30 AM.   Please call to reschedule if you are not able to make this appointment. Contact information: [address] [employer] [address] [place] [address] [phone]                Allergies  Allergen Reactions   Tylenol [Acetaminophen] Other (See Comments)    Impacts liver    Consultations: Infectious disease Orthopedic surgery   Procedures/Studies: ECHO TEE Result Date: 04/21/2023    TRANSESOPHOGEAL ECHO REPORT   Patient Name:   Jonathan Ibarra Date of Exam: 04/21/2023 Medical Rec #:  [id number]          Height:       72.0 in Accession #:    [id number]         Weight:       260.6 lb Date of Birth:  1965-04-26         BSA:          2.384 m Patient Age:  57 years           BP:           116/79 mmHg Patient Gender: M                  HR:           71 bpm. Exam Location:  Inpatient Procedure: 3D Echo, Transesophageal Echo, Color Doppler, Cardiac Doppler and            Saline Contrast Bubble Study (Both Spectral and Color Flow Doppler            were utilized during procedure). Indications:     Endocarditis  History:         Patient has prior history of Echocardiogram examinations, most                  recent 02/15/2024.  Sonographer:     Harriette Bouillon RDCS Referring Phys:  [id number] Cyndi Bender Diagnosing Phys: Jodelle Red MD PROCEDURE: After discussion of the risks and benefits of a TEE, an informed consent was obtained from the patient. The transesophogeal probe was passed without difficulty through the esophogus of the patient. Sedation performed by different physician. Image quality was technically difficult. The patient's vital signs; including heart rate, blood pressure, and oxygen saturation; remained stable throughout the procedure. The patient developed no complications during the procedure.  IMPRESSIONS  1. Left ventricular ejection fraction, by estimation, is 50 to 55%. The left ventricle has low normal function.  2. Right ventricular systolic function is normal. The right  ventricular size is normal.  3. No left atrial/left atrial appendage thrombus was detected.  4. The mitral valve is normal in structure. Mild to moderate mitral valve regurgitation. No evidence of mitral stenosis.  5. Tricuspid valve regurgitation is mild to moderate.  6. The aortic valve is tricuspid. There is mild calcification of the aortic valve. Aortic valve regurgitation is trivial. Aortic valve sclerosis is present, with no evidence of aortic valve stenosis.  7. Agitated saline contrast bubble study was negative, with no evidence of any interatrial shunt.  8. 3D performed of the mitral valve and demonstrates No evidence of vegetation on mitral valve. Conclusion(s)/Recommendation(s): No evidence of vegetation/infective endocarditis on this transesophageael echocardiogram. FINDINGS  Left Ventricle: Left ventricular ejection fraction, by estimation, is 50 to 55%. The left ventricle has low normal function. The left ventricular internal cavity size was normal in size. Right Ventricle: The right ventricular size is normal. No increase in right ventricular wall thickness. Right ventricular systolic function is normal. Left Atrium: Left atrial size was normal in size. No left atrial/left atrial appendage thrombus was detected. Right Atrium: Right atrial size was normal in size. Pericardium: Trivial pericardial effusion is present. Mitral Valve: The mitral valve is normal in structure. Mild to moderate mitral valve regurgitation. No evidence of mitral valve stenosis. Pulmonary venous flow is normal. There is no evidence of mitral valve vegetation. Tricuspid Valve: The tricuspid valve is normal in structure. Tricuspid valve regurgitation is mild to moderate. No evidence of tricuspid stenosis. There is no evidence of tricuspid valve vegetation. Aortic Valve: Focal calcification of noncoronary cusp. The aortic valve is tricuspid. There is mild calcification of the aortic valve. Aortic valve regurgitation is trivial.  Aortic valve sclerosis is present, with no evidence of aortic valve stenosis. There is no evidence of aortic valve vegetation. Pulmonic Valve: The pulmonic valve was grossly normal. Pulmonic valve regurgitation is trivial. No evidence of pulmonic stenosis. There  is no evidence of pulmonic valve vegetation. Aorta: The aortic root and ascending aorta are structurally normal, with no evidence of dilitation. IAS/Shunts: No atrial level shunt detected by color flow Doppler. Agitated saline contrast was given intravenously to evaluate for intracardiac shunting. Agitated saline contrast bubble study was negative, with no evidence of any interatrial shunt. Additional Comments: Spectral Doppler performed. Jodelle Red MD Electronically signed by Jodelle Red MD Signature Date/Time: 04/21/2023/2:22:14 PM    Final    EP STUDY Result Date: 04/21/2023 See surgical note for result.  DG Knee 1-2 Views Right Result Date: 04/20/2023 CLINICAL DATA:  Hardware removal right knee. Intraoperative fluoroscopy. EXAM: RIGHT KNEE - 1-2 VIEW COMPARISON:  Right knee radiographs 04/16/2023, CT right knee 04/18/2023 FINDINGS: Images were performed intraoperatively without the presence of a radiologist. The patient is undergoing removal of the prior proximal tibial lateral plate and screw hardware. Depressed lateral tibial plateau fracture again noted. Total fluoroscopy images: 2 Total fluoroscopy time: 3 seconds Total dose: Radiation Exposure Index (as provided by the fluoroscopic device): 0.26 mGy air Kerma Please see intraoperative findings for further detail. IMPRESSION: Intraoperative fluoroscopy for hardware removal. Electronically Signed   By: Neita Garnet M.D.   On: 04/20/2023 12:20   DG C-Arm 1-60 Min-No Report Result Date: 04/20/2023 Fluoroscopy was utilized by the requesting physician.  No radiographic interpretation.   ECHOCARDIOGRAM COMPLETE Result Date: 04/18/2023    ECHOCARDIOGRAM REPORT   Patient  Name:   Jonathan Ibarra Date of Exam: 04/18/2023 Medical Rec #:  [id number]          Height:       72.0 in Accession #:    [id number]         Weight:       260.6 lb Date of Birth:  01-04-1966         BSA:          2.384 m Patient Age:    57 years           BP:           117/71 mmHg Patient Gender: M                  HR:           56 bpm. Exam Location:  Inpatient Procedure: 2D Echo, Cardiac Doppler and Color Doppler (Both Spectral and Color            Flow Doppler were utilized during procedure). Indications:    Bacteremia  History:        Patient has prior history of Echocardiogram examinations, most                 recent 10/01/2022. Abnormal ECG, Arrythmias:Atrial Fibrillation,                 Signs/Symptoms:Bacteremia; Risk Factors:Hypertension.                 Polysbstance abuse.  Sonographer:    Sheralyn Boatman RDCS Referring Phys: [id number] CORNELIUS N VAN DAM  Sonographer Comments: Technically difficult study due to poor echo windows. Patient moving throughout exam. Patient nearly rolled off the bed. IMPRESSIONS  1. Left ventricular ejection fraction, by estimation, is 50 to 55%. The left ventricle has low normal function. The left ventricle demonstrates global hypokinesis. There is mild left ventricular hypertrophy of the basal-septal segment. Left ventricular diastolic function could not be evaluated.  2. Right ventricular systolic function is normal. The right ventricular size is severely enlarged.  There is normal pulmonary artery systolic pressure. The estimated right ventricular systolic pressure is 34.7 mmHg.  3. The mitral valve is abnormal. Mild mitral valve regurgitation. No evidence of mitral stenosis.  4. Tricuspid valve regurgitation is mild to moderate.  5. The aortic valve is tricuspid. There is moderate calcification of the aortic valve. There is moderate thickening of the aortic valve. Aortic valve regurgitation is trivial. Aortic valve sclerosis/calcification is present, without any evidence of aortic  stenosis.  6. The inferior vena cava is dilated in size with <50% respiratory variability, suggesting right atrial pressure of 15 mmHg. Conclusion(s)/Recommendation(s): No evidence of valvular vegetations on this transthoracic echocardiogram but the mitral valve leaflets are thickened with some calcification. Consider a transesophageal echocardiogram to exclude infective endocarditis if clinically indicated. FINDINGS  Left Ventricle: Left ventricular ejection fraction, by estimation, is 50 to 55%. The left ventricle has low normal function. The left ventricle demonstrates global hypokinesis. Strain imaging was not performed. The left ventricular internal cavity size was normal in size. There is mild left ventricular hypertrophy of the basal-septal segment. Left ventricular diastolic function could not be evaluated. Right Ventricle: The right ventricular size is severely enlarged. No increase in right ventricular wall thickness. Right ventricular systolic function is normal. There is normal pulmonary artery systolic pressure. The tricuspid regurgitant velocity is 2.22 m/s, and with an assumed right atrial pressure of 15 mmHg, the estimated right ventricular systolic pressure is 34.7 mmHg. Left Atrium: Left atrial size was normal in size. Right Atrium: Right atrial size was normal in size. Pericardium: There is no evidence of pericardial effusion. Mitral Valve: The mitral valve is abnormal. There is mild calcification of the mitral valve leaflet(s). Mild mitral valve regurgitation. No evidence of mitral valve stenosis. Tricuspid Valve: The tricuspid valve is normal in structure. Tricuspid valve regurgitation is mild to moderate. No evidence of tricuspid stenosis. Aortic Valve: The aortic valve is tricuspid. There is moderate calcification of the aortic valve. There is moderate thickening of the aortic valve. Aortic valve regurgitation is trivial. Aortic valve sclerosis/calcification is present, without any evidence of  aortic stenosis. Pulmonic Valve: The pulmonic valve was normal in structure. Pulmonic valve regurgitation is not visualized. No evidence of pulmonic stenosis. Aorta: The aortic root is normal in size and structure. Venous: The inferior vena cava is dilated in size with less than 50% respiratory variability, suggesting right atrial pressure of 15 mmHg. IAS/Shunts: No atrial level shunt detected by color flow Doppler. Additional Comments: 3D imaging was not performed.  LEFT VENTRICLE PLAX 2D LVIDd:         4.70 cm      Diastology LVIDs:         3.30 cm      LV e' medial:    6.64 cm/s LV PW:         1.10 cm      LV E/e' medial:  17.5 LV IVS:        1.20 cm      LV e' lateral:   10.40 cm/s LVOT diam:     2.40 cm      LV E/e' lateral: 11.2 LV SV:         91 LV SV Index:   38 LVOT Area:     4.52 cm  LV Volumes (MOD) LV vol d, MOD A2C: 133.0 ml LV vol d, MOD A4C: 107.5 ml LV vol s, MOD A2C: 77.3 ml LV vol s, MOD A4C: 57.1 ml LV SV MOD A2C:  55.7 ml LV SV MOD A4C:     107.5 ml LV SV MOD BP:      54.4 ml RIGHT VENTRICLE            IVC RV S prime:     7.72 cm/s  IVC diam: 2.90 cm TAPSE (M-mode): 1.7 cm LEFT ATRIUM             Index        RIGHT ATRIUM           Index LA diam:        4.80 cm 2.01 cm/m   RA Area:     20.60 cm LA Vol (A2C):   47.8 ml 20.05 ml/m  RA Volume:   65.10 ml  27.31 ml/m LA Vol (A4C):   67.9 ml 28.48 ml/m LA Biplane Vol: 57.6 ml 24.16 ml/m  AORTIC VALVE LVOT Vmax:   95.40 cm/s LVOT Vmean:  60.300 cm/s LVOT VTI:    0.201 m  AORTA Ao Root diam: 3.50 cm Ao Asc diam:  3.50 cm MITRAL VALVE                TRICUSPID VALVE MV Area (PHT): 4.31 cm     TR Peak grad:   19.7 mmHg MV Decel Time: 176 msec     TR Vmax:        222.00 cm/s MV E velocity: 116.00 cm/s                             SHUNTS                             Systemic VTI:  0.20 m                             Systemic Diam: 2.40 cm Armanda Magic MD Electronically signed by Armanda Magic MD Signature Date/Time: 04/18/2023/3:45:13 PM    Final     CT KNEE RIGHT WO CONTRAST Result Date: 04/18/2023 CLINICAL DATA:  Comminuted fracture of the proximal right tibia with ORIF 12/13/2022 and subsequent infection. Concern for osteomyelitis. EXAM: CT OF THE RIGHT KNEE WITHOUT CONTRAST TECHNIQUE: Multidetector CT imaging of the right knee was performed according to the standard protocol. Multiplanar CT image reconstructions were also generated. RADIATION DOSE REDUCTION: This exam was performed according to the departmental dose-optimization program which includes automated exposure control, adjustment of the mA and/or kV according to patient size and/or use of iterative reconstruction technique. COMPARISON:  Radiographs 04/16/2023 and 01/12/2023. CT 02/03/2023 and 12/12/2022. FINDINGS: Bones/Joint/Cartilage Stable postsurgical changes from lateral tibial plate and screw fixation four comminuted fractures of both tibial plateaus. The hardware appears intact without loosening. The underlying fractures of both tibial plateaus are grossly stable, with persistent mild depression of the articular surface and no definite osseous bridging. No bone destruction is identified to suggest osteomyelitis. The distal femur, patella and proximal fibula appear intact. A moderate size knee joint effusion appears slightly enlarged compared with the most recent prior study. Ligaments Suboptimally assessed by CT. Muscles and Tendons The extensor mechanism is intact. No intramuscular fluid collection, focal atrophy or unexpected foreign body identified. Soft tissues Interval increased soft tissue swelling around the knee, especially anterolaterally in the proximal lower leg. No focal fluid collection, unexpected foreign body or soft tissue emphysema demonstrated. IMPRESSION: 1. Stable postsurgical changes from ORIF of  comminuted fractures of both tibial plateaus. No CT evidence of hardware complication or osteomyelitis. Findings consistent with delayed osseous healing. 2. Interval  increased soft tissue swelling around the knee, especially anterolaterally in the proximal lower leg. No focal fluid collection, unexpected foreign body or soft tissue emphysema demonstrated. 3. Moderate size knee joint effusion appears slightly enlarged compared with the most recent prior study, nonspecific. Electronically Signed   By: Carey Bullocks M.D.   On: 04/18/2023 15:38   DG Knee 2 Views Right Result Date: 04/16/2023 CLINICAL DATA:  fall, pain EXAM: RIGHT KNEE - 1-2 VIEW COMPARISON:  January 12, 2023, February 03, 2023 FINDINGS: Osteopenia. Status post ORIF of the proximal tibia for a tibial plateau fracture. Revisualization of a comminuted appearance of the tibial plateau, similar comparison to prior CT. Similar appearance of a lucent area along the tibial plateau compared to prior CT. Large joint effusion. Soft tissue edema. IMPRESSION: 1. Status post ORIF of the proximal tibia for a tibial plateau fracture. Revisualization of a comminuted appearance of the tibial plateau, similar in comparison to prior CT. Similar appearance of a lucency abutting the tibial plateau compared to prior. 2. Large joint effusion.  Soft tissue edema. Electronically Signed   By: Meda Klinefelter M.D.   On: 04/16/2023 14:06     Subjective: No complaints  Discharge Exam: Vitals:   05/03/23 0505 05/03/23 0832  BP: 127/87 127/82  Pulse: 86 84  Resp: 18 18  Temp: 98.1 F (36.7 C) 97.7 F (36.5 C)  SpO2: 96% 98%   Vitals:   05/02/23 1601 05/02/23 2054 05/03/23 0505 05/03/23 0832  BP: 105/89 128/76 127/87 127/82  Pulse: 84 90 86 84  Resp:  18 18 18   Temp: 98 F (36.7 C) (!) 97.4 F (36.3 C) 98.1 F (36.7 C) 97.7 F (36.5 C)  TempSrc:  Oral    SpO2: 97% 100% 96% 98%  Weight:      Height:        General: Pt is alert, awake, not in acute distress Cardiovascular: RRR, S1/S2 +, no rubs, no gallops Respiratory: CTA bilaterally, no wheezing, no rhonchi Abdominal: Soft, NT, ND, bowel sounds  + Extremities: no edema, no cyanosis    The results of significant diagnostics from this hospitalization (including imaging, microbiology, ancillary and laboratory) are listed below for reference.     Microbiology: No results found for this or any previous visit (from the past 240 hours).   Labs: BNP (last 3 results) Recent Labs    09/30/22 2333  BNP 197.7*   Basic Metabolic Panel: Recent Labs  Lab 05/03/23 0743  NA 136  K 4.0  CL 105  CO2 27  GLUCOSE 101*  BUN 17  CREATININE 0.66  CALCIUM 8.4*   Liver Function Tests: No results for input(s): "AST", "ALT", "ALKPHOS", "BILITOT", "PROT", "ALBUMIN" in the last 168 hours. No results for input(s): "LIPASE", "AMYLASE" in the last 168 hours. No results for input(s): "AMMONIA" in the last 168 hours. CBC: Recent Labs  Lab 05/03/23 0743  WBC 4.0  NEUTROABS 2.8  HGB 9.5*  HCT 29.0*  MCV 90.6  PLT 160   Cardiac Enzymes: No results for input(s): "CKTOTAL", "CKMB", "CKMBINDEX", "TROPONINI" in the last 168 hours. BNP: Invalid input(s): "POCBNP" CBG: No results for input(s): "GLUCAP" in the last 168 hours. D-Dimer No results for input(s): "DDIMER" in the last 72 hours. Hgb A1c No results for input(s): "HGBA1C" in the last 72 hours. Lipid Profile No results for input(s): "CHOL", "HDL", "LDLCALC", "  TRIG", "CHOLHDL", "LDLDIRECT" in the last 72 hours. Thyroid function studies No results for input(s): "TSH", "T4TOTAL", "T3FREE", "THYROIDAB" in the last 72 hours.  Invalid input(s): "FREET3" Anemia work up No results for input(s): "VITAMINB12", "FOLATE", "FERRITIN", "TIBC", "IRON", "RETICCTPCT" in the last 72 hours. Urinalysis    Component Value Date/Time   COLORURINE AMBER (A) 10/01/2022 0142   APPEARANCEUR CLOUDY (A) 10/01/2022 0142   LABSPEC 1.025 10/01/2022 0142   PHURINE 5.0 10/01/2022 0142   GLUCOSEU NEGATIVE 10/01/2022 0142   HGBUR SMALL (A) 10/01/2022 0142   BILIRUBINUR NEGATIVE 10/01/2022 0142   KETONESUR  NEGATIVE 10/01/2022 0142   PROTEINUR 30 (A) 10/01/2022 0142   NITRITE NEGATIVE 10/01/2022 0142   LEUKOCYTESUR LARGE (A) 10/01/2022 0142   Sepsis Labs Recent Labs  Lab 05/03/23 0743  WBC 4.0   Microbiology No results found for this or any previous visit (from the past 240 hours).   Time coordinating discharge: Over 35 minutes  SIGNED:   Marinda Elk, MD  [facility] 05/03/2023, 9:51 AM Pager   If 7PM-7AM, please contact night-coverage [email or web address] Password [email or web address]
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
## 2023-05-03 NOTE — TOC Transition Note (Addendum)
 Transition of Care [facility]) - Discharge Note   Patient Details  Name: Jonathan Ibarra MRN: [id number] Date of Birth: 10-31-1965  [facility]) CM/SW Contact:  Epifanio Lesches, RN Phone Number: 05/03/2023, 10:18 AM   Clinical Narrative:     Patient will DC to: home Anticipated DC date: 05/03/2023 Family notified: yes Transport by: car       - Septic joint of right knee joint  Per MD patient ready for DC today. RN, patient, patient's family, and [facility] notified of DC. Pt states has all needed equipment at home, received from family and friends. Post hospital f/u noted on AVS. Pt states friend to provide transportation to home. Pt without  RX med concerns. Pt to receive RX meds from [facility] pharmacy prior to d/c.  RNCM will sign off for now as intervention is no longer needed. Please consult [place] again if new needs arise.     Barriers to Discharge: Continued Medical Work up   Patient Goals and CMS Choice     Choice offered to / list presented to : Patient      Discharge Placement                       Discharge Plan and Services Additional resources added to the After Visit Summary for     Discharge Planning Services: CM Consult            DME Arranged: Tub bench, Walker rolling DME Agency: [employer] Date DME Agency Contacted: 04/28/23 Time DME Agency Contacted: [phone] Representative spoke with at DME Agency: Ian Malkin HH Arranged: PT HH Agency: [facility] Date [facility] Agency Contacted: 04/28/23 Time HH Agency Contacted: [phone]    Social Drivers of Health (SDOH) Interventions SDOH Screenings   Food Insecurity: Food Insecurity Present (04/17/2023)  Housing: Low Risk  (04/17/2023)  Transportation Needs: Unmet Transportation Needs (04/17/2023)  Utilities: Not At Risk (04/17/2023)  Financial Resource Strain: Medium Risk (01/07/2020)   Received from [facility], [facility]  Social Connections: Unknown (07/05/2021)   Received from [facility],  [facility]  Stress: No Stress Concern Present (01/07/2020)   Received from [facility], [facility]  Tobacco Use: High Risk (04/21/2023)     Readmission Risk Interventions     No data to display

## 2023-05-10 ENCOUNTER — Inpatient Hospital Stay (HOSPITAL_COMMUNITY): Admit: 2023-05-10

## 2023-05-13 ENCOUNTER — Inpatient Hospital Stay (HOSPITAL_COMMUNITY): Admission: RE | Admit: 2023-05-13 | Source: Ambulatory Visit

## 2023-05-18 NOTE — Progress Notes (Deleted)
  Cardiology Office Note    Patient Name: Jonathan Ibarra Date of Encounter: 05/18/2023  Primary Care Provider:  Candi Leash, MD Primary Cardiologist:  None Primary Electrophysiologist: None   Past Medical History    Past Medical History:  Diagnosis Date   Alcohol abuse    Cirrhosis of liver (HCC)    Hypertension    PAF (paroxysmal atrial fibrillation) (HCC) 12/28/2022   SVT (supraventricular tachycardia) (HCC) 09/2022    History of Present Illness  Jonathan Ibarra is a 58 y.o. male with a PMH of paroxysmal AF, HTN, EtOH abuse, cirrhosis s/p TIPS, polysubstance abuse who presents today for posthospital follow-up.  Jonathan Ibarra seen initially during hospitalization for right knee infection and sepsis.  He was noted to be in A-fib and was started on IV Cardizem with rate controlled.  He was deemed too high risk for DOAC in the setting of liver disease.  He underwent a 2D echo that showed EF of 55-60% with no RWMA and mildly dilated LA/RA with no significant valve abnormalities.  He had blood cultures completed that were positive for MRSA and underwent I&D with hardware removal of knee on 04/20/2023.  He underwent a TEE that was negative for endocarditis and was discharged on rate control with metoprolol and Cardizem.   During today's visit the patient reports*** .  Patient denies chest pain, palpitations, dyspnea, PND, orthopnea, nausea, vomiting, dizziness, syncope, edema, weight gain, or early satiety.  ***Notes: -Last ischemic evaluation: -Last echo: -Interim ED visits: Review of Systems  Please see the history of present illness.    All other systems reviewed and are otherwise negative except as noted above.  Physical Exam    Wt Readings from Last 3 Encounters:  04/16/23 260 lb 9.3 oz (118.2 kg)  01/12/23 260 lb (117.9 kg)  12/17/22 272 lb 7.8 oz (123.6 kg)   ZO:XWRUE were no vitals filed for this visit.,There is no height or weight on file to calculate BMI. GEN:  Well nourished, well developed in no acute distress Neck: No JVD; No carotid bruits Pulmonary: Clear to auscultation without rales, wheezing or rhonchi  Cardiovascular: Normal rate. Regular rhythm. Normal S1. Normal S2.   Murmurs: There is no murmur.  ABDOMEN: Soft, non-tender, non-distended EXTREMITIES:  No edema; No deformity   EKG/LABS/ Recent Cardiac Studies   ECG personally reviewed by me today - ***  Risk Assessment/Calculations:   {Does this patient have ATRIAL FIBRILLATION?:[phone]}      Lab Results  Component Value Date   WBC 4.0 05/03/2023   HGB 9.5 (L) 05/03/2023   HCT 29.0 (L) 05/03/2023   MCV 90.6 05/03/2023   PLT 160 05/03/2023   Lab Results  Component Value Date   CREATININE 0.66 05/03/2023   BUN 17 05/03/2023   NA 136 05/03/2023   K 4.0 05/03/2023   CL 105 05/03/2023   CO2 27 05/03/2023   No results found for: "CHOL", "HDL", "LDLCALC", "LDLDIRECT", "TRIG", "CHOLHDL"  No results found for: "HGBA1C" Assessment & Plan    1.  Paroxysmal AF:   2.  Essential HTN:  3.  History of hepatic cirrhosis:  4.  Polysubstance abuse:      Disposition: Follow-up with None or APP in *** months {Are you ordering a CV Procedure (e.g. stress test, cath, DCCV, TEE, etc)?   Press F2        :[id number]}   Signed, Napoleon Form, Leodis Rains, NP 05/18/2023, 9:41 AM [facility]

## 2023-05-19 ENCOUNTER — Telehealth: Payer: Self-pay

## 2023-05-19 ENCOUNTER — Ambulatory Visit: Payer: Medicaid Other | Attending: Nurse Practitioner | Admitting: Nurse Practitioner

## 2023-05-19 ENCOUNTER — Inpatient Hospital Stay: Admitting: Infectious Diseases

## 2023-05-19 DIAGNOSIS — K7460 Unspecified cirrhosis of liver: Secondary | ICD-10-CM

## 2023-05-19 DIAGNOSIS — I480 Paroxysmal atrial fibrillation: Secondary | ICD-10-CM

## 2023-05-19 DIAGNOSIS — I10 Essential (primary) hypertension: Secondary | ICD-10-CM

## 2023-05-19 DIAGNOSIS — F1910 Other psychoactive substance abuse, uncomplicated: Secondary | ICD-10-CM

## 2023-05-19 NOTE — Telephone Encounter (Signed)
 Attempted to call patient to reschedule missed appt. Call not be completed at this time.  Juanita Laster, RMA

## 2023-06-10 ENCOUNTER — Encounter (HOSPITAL_COMMUNITY): Payer: Self-pay | Admitting: Internal Medicine

## 2023-06-26 ENCOUNTER — Other Ambulatory Visit: Payer: Self-pay

## 2023-06-26 ENCOUNTER — Inpatient Hospital Stay (HOSPITAL_COMMUNITY)
Admission: EM | Admit: 2023-06-26 | Discharge: 2023-07-07 | DRG: 493 | Disposition: A | Discharge status: Skilled Nursing Facility | Attending: Internal Medicine | Admitting: Internal Medicine

## 2023-06-26 ENCOUNTER — Encounter (HOSPITAL_COMMUNITY): Payer: Self-pay

## 2023-06-26 DIAGNOSIS — Z886 Allergy status to analgesic agent status: Secondary | ICD-10-CM

## 2023-06-26 DIAGNOSIS — M62838 Other muscle spasm: Secondary | ICD-10-CM | POA: Diagnosis present

## 2023-06-26 DIAGNOSIS — E66811 Obesity, class 1: Secondary | ICD-10-CM | POA: Diagnosis present

## 2023-06-26 DIAGNOSIS — Z7982 Long term (current) use of aspirin: Secondary | ICD-10-CM

## 2023-06-26 DIAGNOSIS — Z6833 Body mass index (BMI) 33.0-33.9, adult: Secondary | ICD-10-CM

## 2023-06-26 DIAGNOSIS — F1190 Opioid use, unspecified, uncomplicated: Secondary | ICD-10-CM | POA: Diagnosis present

## 2023-06-26 DIAGNOSIS — Z789 Other specified health status: Secondary | ICD-10-CM

## 2023-06-26 DIAGNOSIS — Z8614 Personal history of Methicillin resistant Staphylococcus aureus infection: Secondary | ICD-10-CM

## 2023-06-26 DIAGNOSIS — Y792 Prosthetic and other implants, materials and accessory orthopedic devices associated with adverse incidents: Secondary | ICD-10-CM | POA: Diagnosis present

## 2023-06-26 DIAGNOSIS — F17290 Nicotine dependence, other tobacco product, uncomplicated: Secondary | ICD-10-CM | POA: Diagnosis present

## 2023-06-26 DIAGNOSIS — D638 Anemia in other chronic diseases classified elsewhere: Secondary | ICD-10-CM | POA: Diagnosis present

## 2023-06-26 DIAGNOSIS — Z91128 Patient's intentional underdosing of medication regimen for other reason: Secondary | ICD-10-CM

## 2023-06-26 DIAGNOSIS — E875 Hyperkalemia: Secondary | ICD-10-CM | POA: Diagnosis not present

## 2023-06-26 DIAGNOSIS — M00061 Staphylococcal arthritis, right knee: Principal | ICD-10-CM | POA: Diagnosis present

## 2023-06-26 DIAGNOSIS — F1590 Other stimulant use, unspecified, uncomplicated: Secondary | ICD-10-CM | POA: Diagnosis present

## 2023-06-26 DIAGNOSIS — Z751 Person awaiting admission to adequate facility elsewhere: Secondary | ICD-10-CM

## 2023-06-26 DIAGNOSIS — K7460 Unspecified cirrhosis of liver: Secondary | ICD-10-CM | POA: Diagnosis present

## 2023-06-26 DIAGNOSIS — F1290 Cannabis use, unspecified, uncomplicated: Secondary | ICD-10-CM | POA: Diagnosis present

## 2023-06-26 DIAGNOSIS — E871 Hypo-osmolality and hyponatremia: Secondary | ICD-10-CM | POA: Diagnosis present

## 2023-06-26 DIAGNOSIS — F17210 Nicotine dependence, cigarettes, uncomplicated: Secondary | ICD-10-CM | POA: Diagnosis present

## 2023-06-26 DIAGNOSIS — M869 Osteomyelitis, unspecified: Secondary | ICD-10-CM | POA: Insufficient documentation

## 2023-06-26 DIAGNOSIS — Z560 Unemployment, unspecified: Secondary | ICD-10-CM

## 2023-06-26 DIAGNOSIS — B9562 Methicillin resistant Staphylococcus aureus infection as the cause of diseases classified elsewhere: Secondary | ICD-10-CM | POA: Diagnosis present

## 2023-06-26 DIAGNOSIS — I482 Chronic atrial fibrillation, unspecified: Secondary | ICD-10-CM | POA: Diagnosis present

## 2023-06-26 DIAGNOSIS — F1010 Alcohol abuse, uncomplicated: Secondary | ICD-10-CM | POA: Diagnosis present

## 2023-06-26 DIAGNOSIS — I4891 Unspecified atrial fibrillation: Secondary | ICD-10-CM

## 2023-06-26 DIAGNOSIS — Z9049 Acquired absence of other specified parts of digestive tract: Secondary | ICD-10-CM

## 2023-06-26 DIAGNOSIS — R001 Bradycardia, unspecified: Secondary | ICD-10-CM | POA: Diagnosis not present

## 2023-06-26 DIAGNOSIS — I119 Hypertensive heart disease without heart failure: Secondary | ICD-10-CM | POA: Diagnosis present

## 2023-06-26 DIAGNOSIS — Z1629 Resistance to other single specified antibiotic: Secondary | ICD-10-CM | POA: Diagnosis present

## 2023-06-26 DIAGNOSIS — M868X6 Other osteomyelitis, lower leg: Secondary | ICD-10-CM | POA: Diagnosis present

## 2023-06-26 DIAGNOSIS — T3696XA Underdosing of unspecified systemic antibiotic, initial encounter: Secondary | ICD-10-CM | POA: Diagnosis present

## 2023-06-26 DIAGNOSIS — Z79899 Other long term (current) drug therapy: Secondary | ICD-10-CM

## 2023-06-26 DIAGNOSIS — T8450XD Infection and inflammatory reaction due to unspecified internal joint prosthesis, subsequent encounter: Secondary | ICD-10-CM

## 2023-06-26 DIAGNOSIS — Z91199 Patient's noncompliance with other medical treatment and regimen due to unspecified reason: Secondary | ICD-10-CM

## 2023-06-26 DIAGNOSIS — T84622A Infection and inflammatory reaction due to internal fixation device of right tibia, initial encounter: Principal | ICD-10-CM | POA: Diagnosis present

## 2023-06-26 DIAGNOSIS — E876 Hypokalemia: Secondary | ICD-10-CM | POA: Diagnosis present

## 2023-06-26 DIAGNOSIS — E8809 Other disorders of plasma-protein metabolism, not elsewhere classified: Secondary | ICD-10-CM | POA: Diagnosis not present

## 2023-06-26 NOTE — ED Triage Notes (Signed)
 Pt complaining of his right knee being swollen. Said there is drainage from it. Has had multiple knee surgeries since October.

## 2023-06-27 ENCOUNTER — Emergency Department (HOSPITAL_COMMUNITY)

## 2023-06-27 ENCOUNTER — Inpatient Hospital Stay (HOSPITAL_COMMUNITY)

## 2023-06-27 DIAGNOSIS — Z560 Unemployment, unspecified: Secondary | ICD-10-CM | POA: Diagnosis not present

## 2023-06-27 DIAGNOSIS — M25461 Effusion, right knee: Secondary | ICD-10-CM | POA: Diagnosis present

## 2023-06-27 DIAGNOSIS — E8809 Other disorders of plasma-protein metabolism, not elsewhere classified: Secondary | ICD-10-CM | POA: Diagnosis not present

## 2023-06-27 DIAGNOSIS — I482 Chronic atrial fibrillation, unspecified: Secondary | ICD-10-CM | POA: Diagnosis present

## 2023-06-27 DIAGNOSIS — Z91199 Patient's noncompliance with other medical treatment and regimen due to unspecified reason: Secondary | ICD-10-CM | POA: Diagnosis not present

## 2023-06-27 DIAGNOSIS — E871 Hypo-osmolality and hyponatremia: Secondary | ICD-10-CM | POA: Diagnosis present

## 2023-06-27 DIAGNOSIS — F17210 Nicotine dependence, cigarettes, uncomplicated: Secondary | ICD-10-CM | POA: Diagnosis present

## 2023-06-27 DIAGNOSIS — Z6833 Body mass index (BMI) 33.0-33.9, adult: Secondary | ICD-10-CM | POA: Diagnosis not present

## 2023-06-27 DIAGNOSIS — M62838 Other muscle spasm: Secondary | ICD-10-CM | POA: Diagnosis present

## 2023-06-27 DIAGNOSIS — I10 Essential (primary) hypertension: Secondary | ICD-10-CM | POA: Diagnosis not present

## 2023-06-27 DIAGNOSIS — M869 Osteomyelitis, unspecified: Secondary | ICD-10-CM | POA: Diagnosis not present

## 2023-06-27 DIAGNOSIS — Z1629 Resistance to other single specified antibiotic: Secondary | ICD-10-CM | POA: Diagnosis present

## 2023-06-27 DIAGNOSIS — I4891 Unspecified atrial fibrillation: Secondary | ICD-10-CM | POA: Diagnosis not present

## 2023-06-27 DIAGNOSIS — T84622A Infection and inflammatory reaction due to internal fixation device of right tibia, initial encounter: Secondary | ICD-10-CM | POA: Diagnosis present

## 2023-06-27 DIAGNOSIS — M868X6 Other osteomyelitis, lower leg: Secondary | ICD-10-CM | POA: Diagnosis present

## 2023-06-27 DIAGNOSIS — E876 Hypokalemia: Secondary | ICD-10-CM | POA: Diagnosis present

## 2023-06-27 DIAGNOSIS — B954 Other streptococcus as the cause of diseases classified elsewhere: Secondary | ICD-10-CM | POA: Diagnosis not present

## 2023-06-27 DIAGNOSIS — B9562 Methicillin resistant Staphylococcus aureus infection as the cause of diseases classified elsewhere: Secondary | ICD-10-CM | POA: Diagnosis present

## 2023-06-27 DIAGNOSIS — F1590 Other stimulant use, unspecified, uncomplicated: Secondary | ICD-10-CM | POA: Diagnosis present

## 2023-06-27 DIAGNOSIS — D638 Anemia in other chronic diseases classified elsewhere: Secondary | ICD-10-CM | POA: Diagnosis present

## 2023-06-27 DIAGNOSIS — E875 Hyperkalemia: Secondary | ICD-10-CM | POA: Diagnosis not present

## 2023-06-27 DIAGNOSIS — M00061 Staphylococcal arthritis, right knee: Secondary | ICD-10-CM | POA: Diagnosis present

## 2023-06-27 DIAGNOSIS — M86161 Other acute osteomyelitis, right tibia and fibula: Secondary | ICD-10-CM | POA: Diagnosis not present

## 2023-06-27 DIAGNOSIS — Z7982 Long term (current) use of aspirin: Secondary | ICD-10-CM | POA: Diagnosis not present

## 2023-06-27 DIAGNOSIS — F1190 Opioid use, unspecified, uncomplicated: Secondary | ICD-10-CM | POA: Diagnosis present

## 2023-06-27 DIAGNOSIS — F1290 Cannabis use, unspecified, uncomplicated: Secondary | ICD-10-CM | POA: Diagnosis present

## 2023-06-27 DIAGNOSIS — Y792 Prosthetic and other implants, materials and accessory orthopedic devices associated with adverse incidents: Secondary | ICD-10-CM | POA: Diagnosis present

## 2023-06-27 DIAGNOSIS — T8450XD Infection and inflammatory reaction due to unspecified internal joint prosthesis, subsequent encounter: Secondary | ICD-10-CM

## 2023-06-27 DIAGNOSIS — I119 Hypertensive heart disease without heart failure: Secondary | ICD-10-CM | POA: Diagnosis present

## 2023-06-27 DIAGNOSIS — F1010 Alcohol abuse, uncomplicated: Secondary | ICD-10-CM | POA: Diagnosis present

## 2023-06-27 DIAGNOSIS — E66811 Obesity, class 1: Secondary | ICD-10-CM | POA: Diagnosis present

## 2023-06-27 DIAGNOSIS — K7460 Unspecified cirrhosis of liver: Secondary | ICD-10-CM | POA: Diagnosis present

## 2023-06-27 DIAGNOSIS — T8453XA Infection and inflammatory reaction due to internal right knee prosthesis, initial encounter: Secondary | ICD-10-CM | POA: Diagnosis not present

## 2023-06-27 LAB — CBC WITH DIFFERENTIAL/PLATELET
Abs Immature Granulocytes: 0.02 K/uL (ref 0.00–0.07)
Basophils Absolute: 0.1 K/uL (ref 0.0–0.1)
Basophils Relative: 1 %
Eosinophils Absolute: 0.2 K/uL (ref 0.0–0.5)
Eosinophils Relative: 2 %
HCT: 40.9 % (ref 39.0–52.0)
Hemoglobin: 13 g/dL (ref 13.0–17.0)
Immature Granulocytes: 0 %
Lymphocytes Relative: 13 %
Lymphs Abs: 0.9 K/uL (ref 0.7–4.0)
MCH: 28.4 pg (ref 26.0–34.0)
MCHC: 31.8 g/dL (ref 30.0–36.0)
MCV: 89.5 fL (ref 80.0–100.0)
Monocytes Absolute: 1 K/uL (ref 0.1–1.0)
Monocytes Relative: 15 %
Neutro Abs: 4.8 K/uL (ref 1.7–7.7)
Neutrophils Relative %: 69 %
Platelets: 298 K/uL (ref 150–400)
RBC: 4.57 MIL/uL (ref 4.22–5.81)
RDW: 16.4 % — ABNORMAL HIGH (ref 11.5–15.5)
WBC: 6.9 K/uL (ref 4.0–10.5)
nRBC: 0 % (ref 0.0–0.2)

## 2023-06-27 LAB — COMPREHENSIVE METABOLIC PANEL WITH GFR
ALT: 12 U/L (ref 0–44)
AST: 24 U/L (ref 15–41)
Albumin: 2.4 g/dL — ABNORMAL LOW (ref 3.5–5.0)
Alkaline Phosphatase: 100 U/L (ref 38–126)
Anion gap: 10 (ref 5–15)
BUN: 9 mg/dL (ref 6–20)
CO2: 24 mmol/L (ref 22–32)
Calcium: 8.8 mg/dL — ABNORMAL LOW (ref 8.9–10.3)
Chloride: 104 mmol/L (ref 98–111)
Creatinine, Ser: 0.71 mg/dL (ref 0.61–1.24)
GFR, Estimated: >60 mL/min (ref >60)
Glucose, Bld: 56 mg/dL — ABNORMAL LOW (ref 70–99)
Potassium: 3.1 mmol/L — ABNORMAL LOW (ref 3.5–5.1)
Sodium: 138 mmol/L (ref 135–145)
Total Bilirubin: 1.2 mg/dL (ref 0.0–1.2)
Total Protein: 7.8 g/dL (ref 6.5–8.1)

## 2023-06-27 LAB — RAPID URINE DRUG SCREEN, HOSP PERFORMED
Amphetamines: POSITIVE — AB
Barbiturates: NOT DETECTED
Benzodiazepines: NOT DETECTED
Cocaine: NOT DETECTED
Opiates: POSITIVE — AB
Tetrahydrocannabinol: POSITIVE — AB

## 2023-06-27 LAB — PROTIME-INR
INR: 1.2 (ref 0.8–1.2)
Prothrombin Time: 15.5 s — ABNORMAL HIGH (ref 11.4–15.2)

## 2023-06-27 LAB — HEMOGLOBIN A1C
Hgb A1c MFr Bld: 4.5 % — ABNORMAL LOW (ref 4.8–5.6)
Mean Plasma Glucose: 82.45 mg/dL

## 2023-06-27 LAB — SURGICAL PCR SCREEN
MRSA, PCR: POSITIVE — AB
Staphylococcus aureus: POSITIVE — AB

## 2023-06-27 LAB — I-STAT CG4 LACTIC ACID, ED
Lactic Acid, Venous: 1.8 mmol/L (ref 0.5–1.9)
Lactic Acid, Venous: 1 mmol/L (ref 0.5–1.9)

## 2023-06-27 LAB — CBG MONITORING, ED: Glucose-Capillary: 82 mg/dL (ref 70–99)

## 2023-06-27 MED ORDER — MUPIROCIN 2 % EX OINT
1.0000 | TOPICAL_OINTMENT | Freq: Two times a day (BID) | CUTANEOUS | Status: AC
  Administered 2023-06-27 – 2023-07-02 (×7): 1 via NASAL
  Filled 2023-06-27 (×2): qty 22

## 2023-06-27 MED ORDER — SODIUM CHLORIDE 0.9 % IV SOLN
2.0000 g | Freq: Once | INTRAVENOUS | Status: AC
  Administered 2023-06-27: 2 g via INTRAVENOUS
  Filled 2023-06-27: qty 20

## 2023-06-27 MED ORDER — LACTATED RINGERS IV SOLN: INTRAVENOUS | Status: DC

## 2023-06-27 MED ORDER — OXYCODONE HCL 5 MG PO TABS
15.0000 mg | ORAL_TABLET | ORAL | Status: DC | PRN
  Administered 2023-06-27 – 2023-07-06 (×22): 15 mg via ORAL
  Filled 2023-06-27 (×23): qty 3

## 2023-06-27 MED ORDER — LACTATED RINGERS IV BOLUS (SEPSIS): 1000.0000 mL | Freq: Once | INTRAVENOUS | Status: DC

## 2023-06-27 MED ORDER — DILTIAZEM LOAD VIA INFUSION
15.0000 mg | Freq: Once | INTRAVENOUS | Status: AC
  Administered 2023-06-27: 15 mg via INTRAVENOUS
  Filled 2023-06-27: qty 15

## 2023-06-27 MED ORDER — LACTATED RINGERS IV BOLUS (SEPSIS): 500.0000 mL | Freq: Once | INTRAVENOUS | Status: DC

## 2023-06-27 MED ORDER — VANCOMYCIN HCL 2000 MG/400ML IV SOLN
2000.0000 mg | Freq: Once | INTRAVENOUS | Status: AC
  Administered 2023-06-27: 2000 mg via INTRAVENOUS
  Filled 2023-06-27: qty 400

## 2023-06-27 MED ORDER — METOPROLOL SUCCINATE ER 25 MG PO TB24
50.0000 mg | ORAL_TABLET | Freq: Every day | ORAL | Status: DC
  Administered 2023-06-27 – 2023-07-07 (×11): 50 mg via ORAL
  Filled 2023-06-27 (×11): qty 2

## 2023-06-27 MED ORDER — POTASSIUM CHLORIDE CRYS ER 20 MEQ PO TBCR
40.0000 meq | EXTENDED_RELEASE_TABLET | Freq: Once | ORAL | Status: AC
  Administered 2023-06-27: 40 meq via ORAL
  Filled 2023-06-27: qty 2

## 2023-06-27 MED ORDER — VANCOMYCIN HCL 1500 MG/300ML IV SOLN
1500.0000 mg | Freq: Two times a day (BID) | INTRAVENOUS | Status: DC
  Administered 2023-06-27 – 2023-06-29 (×4): 1500 mg via INTRAVENOUS
  Filled 2023-06-27 (×6): qty 300

## 2023-06-27 MED ORDER — MORPHINE SULFATE (PF) 4 MG/ML IV SOLN
4.0000 mg | Freq: Once | INTRAVENOUS | Status: AC
  Administered 2023-06-27: 4 mg via INTRAVENOUS
  Filled 2023-06-27: qty 1

## 2023-06-27 MED ORDER — METHOCARBAMOL 750 MG PO TABS
750.0000 mg | ORAL_TABLET | Freq: Three times a day (TID) | ORAL | Status: DC | PRN
  Administered 2023-06-27 – 2023-07-07 (×6): 750 mg via ORAL
  Filled 2023-06-27 (×6): qty 1

## 2023-06-27 MED ORDER — DILTIAZEM HCL ER COATED BEADS 180 MG PO CP24
180.0000 mg | ORAL_CAPSULE | Freq: Every day | ORAL | Status: DC
Start: 2023-06-27 — End: 2023-07-08
  Administered 2023-06-27 – 2023-07-07 (×11): 180 mg via ORAL
  Filled 2023-06-27 (×11): qty 1

## 2023-06-27 MED ORDER — LIDOCAINE-EPINEPHRINE (PF) 2 %-1:200000 IJ SOLN
20.0000 mL | Freq: Once | INTRAMUSCULAR | Status: AC
  Administered 2023-06-27: 20 mL
  Filled 2023-06-27: qty 20

## 2023-06-27 MED ORDER — VANCOMYCIN HCL IN DEXTROSE 1-5 GM/200ML-% IV SOLN: 1000.0000 mg | Freq: Once | INTRAVENOUS | Status: DC

## 2023-06-27 MED ORDER — ENOXAPARIN SODIUM 40 MG/0.4ML IJ SOSY
40.0000 mg | PREFILLED_SYRINGE | INTRAMUSCULAR | Status: DC
  Administered 2023-06-27 – 2023-06-29 (×3): 40 mg via SUBCUTANEOUS
  Filled 2023-06-27 (×3): qty 0.4

## 2023-06-27 MED ORDER — DILTIAZEM HCL-DEXTROSE 125-5 MG/125ML-% IV SOLN (PREMIX)
5.0000 mg/h | INTRAVENOUS | Status: AC
  Administered 2023-06-27: 5 mg/h via INTRAVENOUS
  Filled 2023-06-27: qty 125

## 2023-06-27 NOTE — TOC Initial Note (Signed)
 Transition of Care (TOC) - Initial/Assessment Note   Spoke to patient at bedside. Patient from home, he is staying at address on face sheet with his friend Pattie Borders.   They currently do not have water, Vidalia Serpas's friends are bringing them water in galloon containers.   NCM added resources to AVS.   Patient has walker and two canes at home.   He is active with [facility] for HHPT/OT. Confirmed with Amy with [facility], she will need new orders and face to face to continue services.   At discharge patient wants to go to [facility]. Await post op PT/OT evaluations. Also explained insurance would ned to approve. Patient voiced understanding  Patient Details  Name: Jonathan Ibarra MRN: [id number] Date of Birth: [birth year]  Transition of Care [facility]) CM/SW Contact:    Terre Ferri, RN Phone Number: 06/27/2023, 3:03 PM  Clinical Narrative:                   Expected Discharge Plan:  (await post op PT/OT eval) Barriers to Discharge: Continued Medical Work up   Patient Goals and CMS Choice Patient states their goals for this hospitalization and ongoing recovery are:: patient wants to go to [facility]          Expected Discharge Plan and Services   Discharge Planning Services: CM Consult Post Acute Care Choice:  (await PT eval post op) Living arrangements for the past 2 months: Single Family Home                 DME Arranged:  (await post op eval)         HH Arranged:  (see note)          Prior Living Arrangements/Services Living arrangements for the past 2 months: Single Family Home Lives with:: Friends Patient language and need for interpreter reviewed:: Yes Do you feel safe going back to the place where you live?: Yes      Need for Family Participation in Patient Care: Yes (Comment) Care giver support system in place?: Yes (comment) Current home services: DME Criminal Activity/Legal Involvement Pertinent to Current Situation/Hospitalization: No  - Comment as needed  Activities of Daily Living   ADL Screening (condition at time of admission) Independently performs ADLs?: No Does the patient have a NEW difficulty with bathing/dressing/toileting/self-feeding that is expected to last >3 days?: No Does the patient have a NEW difficulty with getting in/out of bed, walking, or climbing stairs that is expected to last >3 days?: No Does the patient have a NEW difficulty with communication that is expected to last >3 days?: No Is the patient deaf or have difficulty hearing?: No Does the patient have difficulty seeing, even when wearing glasses/contacts?: No Does the patient have difficulty concentrating, remembering, or making decisions?: No  Permission Sought/Granted   Permission granted to share information with : Yes, Verbal Permission Granted     Permission granted to share info w AGENCY: [facility]        Emotional Assessment Appearance:: Appears stated age Attitude/Demeanor/Rapport: Engaged Affect (typically observed): Appropriate Orientation: : Oriented to Self, Oriented to Place, Oriented to  Time, Oriented to Situation Alcohol / Substance Use: Not Applicable Psych Involvement: No (comment)  Admission diagnosis:  Hypokalemia [E87.6] Atrial fibrillation with rapid ventricular response (HCC) [I48.91] Staphylococcal arthritis of right knee (HCC) [M00.061] Prosthetic joint infection, subsequent encounter [T84.50XD] Patient Active Problem List   Diagnosis Date Noted   Prosthetic joint infection, subsequent encounter 06/27/2023   Bacteremia  04/21/2023   MRSA bacteremia 04/17/2023   Effusion of right knee joint 04/17/2023   Septic joint of right knee joint (HCC) 04/16/2023   Atrial fibrillation, chronic (HCC) 04/16/2023   Wound infection complicating hardware, subsequent encounter 02/04/2023   Drainage from wound 02/04/2023   Knee effusion, right 12/28/2022   Septic arthritis of knee, right (HCC) 12/28/2022   Fall from  bridge, initial encounter 12/13/2022   Atrial fibrillation with rapid ventricular response (HCC) 10/01/2022   SIRS without infection with organ dysfunction (HCC) 10/01/2022   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 [place] [address] Phone: [phone] Fax: [phone]     Social Drivers of Health (SDOH) Social History: SDOH Screenings   Food Insecurity: Food Insecurity Present (04/17/2023)  Housing: Low Risk  (04/17/2023)  Transportation Needs: Unmet Transportation Needs (04/17/2023)  Utilities: Not At Risk (04/17/2023)  Financial Resource Strain: Medium Risk (01/07/2020)   Received from [facility], [facility]  Social Connections: Unknown (07/05/2021)   Received from [facility], [facility]  Stress: No Stress Concern Present (01/07/2020)   Received from [facility], [facility]  Tobacco Use: High Risk (06/26/2023)   SDOH Interventions: Food Insecurity Interventions: Inpatient TOC Transportation Interventions: Inpatient TOC   Readmission Risk Interventions     No data to display

## 2023-06-27 NOTE — H&P (Addendum)
 History and Physical    Patient: Jonathan Ibarra EAV:[id number] DOB: 06-May-1965 DOA: 06/26/2023 DOS: the patient was seen and examined on 06/27/2023 PCP: Sharry Deem, MD  Patient coming from: Home  Chief Complaint:  Chief Complaint  Patient presents with   Joint Swelling   HPI: Jonathan Ibarra is a 58 y.o. 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  Pt states that he was in his USOH until this past week when he noticed increased pain, swelling, erythema, and pus-like drainage from his R knee. He admits to "not taking care of his knee" and reports to have "given up". Pt denies SI. Per Epic chart review pt did not report to the infusion clinic for abx administration in 04/2023 as planned, and has not followed up with ID.  In the ED, the pt was noted to have AFRVR to 150s and was placed on cardizem  gtt per ED and LR at 150cc/h given c/f sepsis; as such, pt will require care in PCU.  Review of Systems: As mentioned in the history of present illness. All other systems reviewed and are negative.  Past Medical History:  Diagnosis Date   Alcohol abuse    Cirrhosis of liver (HCC)    Hypertension    PAF (paroxysmal atrial fibrillation) (HCC) 12/28/2022   SVT (supraventricular tachycardia) (HCC) 09/2022   Past Surgical History:  Procedure Laterality Date   CHOLECYSTECTOMY     HARDWARE REMOVAL Right 04/20/2023   Procedure: HARDWARE REMOVAL Knee;  Surgeon: Laneta Pintos, MD;  Location: [facility] OR;  Service: Orthopedics;   Laterality: Right;   IR TIPS     IRRIGATION AND DEBRIDEMENT KNEE Right 12/29/2022   Procedure: IRRIGATION AND DEBRIDEMENT KNEE;  Surgeon: Laneta Pintos, MD;  Location: [facility] OR;  Service: Orthopedics;  Laterality: Right;   IRRIGATION AND DEBRIDEMENT KNEE Right 01/12/2023   Procedure: IRRIGATION AND DEBRIDEMENT KNEE;  Surgeon: Laneta Pintos, MD;  Location: [facility] OR;  Service: Orthopedics;  Laterality: Right;   ORIF TIBIA PLATEAU Right 12/13/2022   Procedure: OPEN REDUCTION INTERNAL FIXATION (ORIF) TIBIAL PLATEAU;  Surgeon: Laneta Pintos, MD;  Location: [facility] OR;  Service: Orthopedics;  Laterality: Right;   TRANSESOPHAGEAL ECHOCARDIOGRAM (CATH LAB) N/A 04/21/2023   Procedure: TRANSESOPHAGEAL ECHOCARDIOGRAM;  Surgeon: Sheryle Donning, MD;  Location: [facility] INVASIVE CV LAB;  Service: Cardiovascular;  Laterality: N/A;   Social History:  reports that he has been smoking cigarettes. He has never used smokeless tobacco. He reports current drug use. Drugs: Cocaine and Marijuana. He reports that he does not drink alcohol.  Allergies  Allergen Reactions   Tylenol  [Acetaminophen ] Other (See Comments)    Impacts liver    History reviewed. No pertinent family history.  Prior to Admission medications   Medication Sig Start Date End Date Taking? Authorizing Provider  diltiazem  (CARDIZEM  CD) 180 MG 24 hr capsule Take 1 capsule (180 mg total) by mouth daily. 05/04/23  Yes Macdonald Savoy, MD  methocarbamol  (ROBAXIN ) 750 MG tablet Take 1 tablet (750 mg total) by mouth every 8 (eight) hours as needed for muscle spasms. 05/03/23  Yes McClung, Sarah A, PA-C  metoprolol  succinate (TOPROL -XL) 50  MG 24 hr tablet Take 1 tablet (50 mg total) by mouth daily. Take with or immediately following a meal. 05/04/23  Yes Macdonald Savoy, MD  oxyCODONE  (ROXICODONE ) 15 MG immediate release tablet Take 1 tablet (15 mg total) by mouth every 4 (four) hours as needed. 05/03/23  Yes McClung, Sarah A, PA-C  folic acid  (FOLVITE ) 1  MG tablet Take 1 tablet (1 mg total) by mouth daily. Patient not taking: Reported on 06/27/2023 02/04/23   Burton Casey, MD  lactulose  (CHRONULAC ) 10 GM/15ML solution Take 30 mLs (20 g total) by mouth 3 (three) times daily. Patient not taking: Reported on 06/27/2023 02/04/23   Burton Casey, MD  thiamine  (VITAMIN B1) 100 MG tablet Take 1 tablet (100 mg total) by mouth daily. Patient not taking: Reported on 06/27/2023 02/04/23   Burton Casey, MD  Vitamin D , Ergocalciferol , (DRISDOL ) 1.25 MG (50000 UNIT) CAPS capsule Take 1 capsule (50,000 Units total) by mouth every 7 (seven) days. Patient not taking: Reported on 06/27/2023 02/11/23   Burton Casey, MD    Physical Exam: Vitals:   06/27/23 0700 06/27/23 0730 06/27/23 0745 06/27/23 0831  BP: (!) 142/97 138/89 (!) 148/96 (!) 144/96  Pulse: 95 87 (!) 104 91  Resp: 20 (!) 21 (!) 21 19  Temp:    (!) 97.5 F (36.4 C)  TempSrc:    Oral  SpO2: 100% 100% 96% 100%  Weight:      Height:       General: Alert, oriented x3, resting comfortably in no acute distress Respiratory: Lungs clear to auscultation bilaterally with normal respiratory effort; no w/r/r Cardiovascular: Regular rate and rhythm w/o m/r/g  Data Reviewed:  Lab Results  Component Value Date   WBC 6.9 06/27/2023   HGB 13.0 06/27/2023   HCT 40.9 06/27/2023   MCV 89.5 06/27/2023   PLT 298 06/27/2023   Lab Results  Component Value Date   GLUCOSE 56 (L) 06/27/2023   CALCIUM  8.8 (L) 06/27/2023   NA 138 06/27/2023   K 3.1 (L) 06/27/2023   CO2 24 06/27/2023   CL 104 06/27/2023   BUN 9 06/27/2023   CREATININE 0.71 06/27/2023   Lab Results  Component Value Date   ALT 12 06/27/2023   AST 24 06/27/2023   ALKPHOS 100 06/27/2023   BILITOT 1.2 06/27/2023   Lab Results  Component Value Date   INR 1.2 06/27/2023   INR 1.4 (H) 04/20/2023   INR 1.2 12/12/2022    Radiology: DG Knee Right Port Result Date: 06/27/2023 CLINICAL DATA:  Septic joint. EXAM:  PORTABLE RIGHT KNEE - 1-2 VIEW COMPARISON:  04/20/2023 FINDINGS: Four views study limited by positioning and underpenetration. Sequelae of medial and lateral tibial plateau fracture again noted, status post hardware retrieval. Loss of joint space noted medial compartment with some evidence of bony resorption in the region of the lateral tibial plateau. Probable joint effusion. Soft tissue edema evident. IMPRESSION: 1. Sequelae of medial and lateral tibial plateau fracture, status post hardware retrieval. 2. Probable joint effusion with some evidence of bony resorption in the region of the lateral tibial plateau which may be related to prior trauma although osteomyelitis not excluded. 3. Soft tissue edema. Electronically Signed   By: Donnal Fusi M.D.   On: 06/27/2023 05:01   DG Chest Port 1 View Result Date: 06/27/2023 EXAM: 1 VIEW(S) XRAY OF THE CHEST 06/27/2023 02:04:32 AM COMPARISON: 01/20/2023 CLINICAL HISTORY: Questionable sepsis - evaluate for abnormality. FINDINGS: LUNGS AND PLEURA: Mild interstitial  edema. Small right pleural effusion. No consolidation. No pneumothorax. HEART AND MEDIASTINUM: Cardiomegaly. BONES AND SOFT TISSUES: No acute osseous abnormality. IMPRESSION: 1. Cardiomegaly with mild interstitial edema and small right pleural effusion. Electronically signed by: Zadie Herter MD 06/27/2023 02:06 AM EDT RP Workstation: [id number]   Assessment and Plan: Clayborne Salvia is a 58 y.o. 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 Not a candidate for anticoagulation. -Continue cardizem  gtt and transition to home PO cardizem  and PO metoprolol  as appropriate   Advance Care Planning:   Code Status: Full Code   Consults: ID  Family Communication: N/A  Severity of Illness: The appropriate patient status for this patient is INPATIENT. Inpatient status is judged to be reasonable and necessary in order to provide the required intensity of service to ensure the patient's safety. The patient's presenting symptoms, physical exam findings, and initial radiographic and laboratory data in the context of their chronic comorbidities is felt to place them at high risk for further clinical deterioration. Furthermore, it is not anticipated that the patient will be medically stable for discharge from the hospital within 2 midnights of admission.   * I certify that at the point of admission it is my clinical judgment that the patient will require inpatient hospital care spanning beyond 2 midnights from the point of admission due to high intensity of service, high risk for further deterioration and high frequency of surveillance required.*  ----- I spent 55 minutes reviewing previous labs/notes, obtaining separate history at the bedside, counseling/discussing the treatment plan outlined above, ordering medications/tests, and performing clinical documentation.  Author: Arne Langdon, MD 06/27/2023  9:16 AM  For on call review [email or web address].

## 2023-06-27 NOTE — Hospital Course (Addendum)
 Jonathan Ibarra is a 58 y.o. male with past medical history significant for tibial plateau fracture, right knee septic arthritis with hardware removal, I&D, wound VAC placement February 2025, HTN, SVT, paroxysmal atrial fibrillation not on anticoagulation, cirrhosis s/p TIPS, chronic thrombocytopenia, anemia of chronic medical disease, polysubstance abuse who presented to [facility] ED on 06/26/2023 with right knee pain, swelling, drainage. He reported he was in his usual state of health until this past week when he noticed increased pain, swelling, erythema and puslike drainage from his right knee.    Imaging was done and MR right knee without contrast showed enlarging complex knee joint effusion with diffuse periarticular marrow edema suspicious for septic arthritis and osteomyelitis, probable associated intraosseous abscess proximal tibia with fistulous tract communicating with a lateral soft tissue component, extensive periarticular soft tissue edema surrounding the knee.  Blood cultures x 2 obtained.  Patient was started on a Cardizem  drip for A-fib with RVR.  Orthopedics consulted and ID consulted. 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  He underwent I&D and is POD day 5 with I&D. PT/OT recommending SNF. ID recommending 4 weeks of IV Vancomycin  (in hospital vs SNF) but likely in the hospital and then Oritavancin  to complete course w/ then with chronic oral Abx for suppression.   Assessment and Plan:   Right knee septic arthritis with abscess with Concern for osteomyelitis: Patient presenting with 1 week history of progressive right knee swelling, erythema, pain with purulent discharge.  Noncompliant with outpatient therapy including IV antibiotics and follow-up with infectious disease.  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  Patient is  afebrile without leukocytosis.  MR right knee with findings consistent with acute septic arthritis with concern for osteomyelitis and abscess formation.  Seen by orthopedics, Dr. Curtiss Dowdy who initially recommended AKA, but patient declined with planned I&D on 4/30. CRP was 8.7. Orthopedics ([facility]) following and consulted ID. Blood cultures x 2: No growth x 2 days;  Superficial leg culture: + MRSA; C/w  IV Vancomycin  @ current dose, pharmacy consulted for dosing/monitoring. Pain control w/ Oxycodone  15 mg p.o. every 4 hours as needed severe pain, Robaxin  750 mg p.o. every 8 hours as needed muscle spasms, and Hydromorphone  1 mg IV every 4 hours as needed severe pain not relieved with oral medication. S/p Excision of intraosseous abscess R Proximal Tibia and I&D of R Leg Abscess and Placement of Abx Beads Intraosseously.  Operative cultures are growing 1 out of 3 Staph aureus.  -Patient Now wants to go to SNF; initially removed multiple IV's and now has Midline temporarily which is working well but and plan was to transition to PICC line on 07/07/2023 however PICC removed is midline now.  After further discussion with the infectious disease team and the IV team her decision has been made to place a tunneled catheter for the patient as it would be more difficult to remove. Per ID will need at least 4 weeks of IV Abx with Vancomycin  with Transition to Oritavancin  to complete Course of Abx and then oral Abx for Suppression.  Paroxysmal Atrial Fibrillation with RVR  Essential HTN: Patient presenting with elevated heart rate of 153.  Initially placed on Cardizem  drip.  Home medications restarted and was able to be titrated off.  Not on anticoagulation outpatient. C/w Diltiazem  180 mg p.o. daily and Metoprolol  Succinate 50 mg p.o. daily. HR remains well-controlled  now, discontinued telemetry. CTM BP per protocol. Last BP reading was 121/88.   Cirrhosis s/p TIPS: C/w 2 gram Low-salt diet. Will need  Outpatient follow-up  with GI. Bilirubin was elevated at 1.5 on last check but LFTs normal. CTM and Trend and watch for decompensation. PT-INR was 16.0-1.3 respectively on last check on 5 1   Anemia of Chronic Medical Disease: Hgb/Hct relatively stable and is now 12.9/39.6. Check Anemia Panel in the AM. CTM for S/Sx of Bleeding; No overt bleeding noted. Repeat CBC in the AM   Hyponatremia: Mild. Na+ ranging from 131-134 and was 132 today. CTM and trend and Repeat CMP in the AM  Hyperkalemia: K+ was 5.2 and Given 1x dose of 10 grams of Lokelama; Now 4.1 today.   Polysubstance use: UDS positive for amphetamines, opiates, THC. Counseled on need for complete abstinence/cessation from illicit substances.  Hyperbilirubinemia: Fluctuating. Ranging between 0.9-1.3. CTM and Trend and repeat CMP in the AM  Hypoalbuminemia: Patient's Albumin is now 2.3. CTM and Trend and repeat CMP in the AM  Class I Obesity: Complicates overall prognosis and care. Estimated body mass index is 33.64 kg/m as calculated from the following:   Height as of this encounter: 6\' 1"  (1.854 m).   Weight as of this encounter: 115.7 kg. Weight Loss and Dietary Counseling given

## 2023-06-27 NOTE — Progress Notes (Signed)
 Notified Dr. Arne Langdon that surgical PCR was positive for MRSA, per micro lab.  Patient currently on contact precautions.

## 2023-06-27 NOTE — Consult Note (Signed)
 Reason for Consult:Right knee infection Referring Physician: Arne Langdon Time called: 1111 Time at bedside: 1116   Jonathan Ibarra is an 58 y.o. male.  HPI: [name] was admitted with likely septic arthritis of the right knee. He has a long hx/o problems since his initial fx. He notes he has not been taking care of it nor going in for his abx as rx. He also ate this morning, last had a doughnut around 1000.  Past Medical History:  Diagnosis Date   Alcohol abuse    Cirrhosis of liver (HCC)    Hypertension    PAF (paroxysmal atrial fibrillation) (HCC) 12/28/2022   SVT (supraventricular tachycardia) (HCC) 09/2022    Past Surgical History:  Procedure Laterality Date   CHOLECYSTECTOMY     HARDWARE REMOVAL Right 04/20/2023   Procedure: HARDWARE REMOVAL Knee;  Surgeon: Laneta Pintos, MD;  Location: [facility] OR;  Service: Orthopedics;  Laterality: Right;   IR TIPS     IRRIGATION AND DEBRIDEMENT KNEE Right 12/29/2022   Procedure: IRRIGATION AND DEBRIDEMENT KNEE;  Surgeon: Laneta Pintos, MD;  Location: [facility] OR;  Service: Orthopedics;  Laterality: Right;   IRRIGATION AND DEBRIDEMENT KNEE Right 01/12/2023   Procedure: IRRIGATION AND DEBRIDEMENT KNEE;  Surgeon: Laneta Pintos, MD;  Location: [facility] OR;  Service: Orthopedics;  Laterality: Right;   ORIF TIBIA PLATEAU Right 12/13/2022   Procedure: OPEN REDUCTION INTERNAL FIXATION (ORIF) TIBIAL PLATEAU;  Surgeon: Laneta Pintos, MD;  Location: [facility] OR;  Service: Orthopedics;  Laterality: Right;   TRANSESOPHAGEAL ECHOCARDIOGRAM (CATH LAB) N/A 04/21/2023   Procedure: TRANSESOPHAGEAL ECHOCARDIOGRAM;  Surgeon: Sheryle Donning, MD;  Location: [facility] INVASIVE CV LAB;  Service: Cardiovascular;  Laterality: N/A;    History reviewed. No pertinent family history.  Social History:  reports that he has been smoking cigarettes. He has never used smokeless tobacco. He reports current drug use. Drugs: Cocaine and Marijuana. He reports that he does not drink  alcohol.  Allergies:  Allergies  Allergen Reactions   Tylenol  [Acetaminophen ] Other (See Comments)    Impacts liver    Medications: I have reviewed the patient's current medications.  Results for orders placed or performed during the hospital encounter of 06/26/23 (from the past 48 hours)  Urine rapid drug screen (hosp performed)     Status: Abnormal   Collection Time: 06/27/23  1:27 AM  Result Value Ref Range   Opiates POSITIVE (A) NONE DETECTED   Cocaine NONE DETECTED NONE DETECTED   Benzodiazepines NONE DETECTED NONE DETECTED   Amphetamines POSITIVE (A) NONE DETECTED    Comment: (NOTE) Trazodone is metabolized in vivo to several metabolites, including pharmacologically active m-CPP, which is excreted in the urine. Immunoassay screens for amphetamines and MDMA have potential cross-reactivity with these compounds and may provide false positive  results.     Tetrahydrocannabinol POSITIVE (A) NONE DETECTED   Barbiturates NONE DETECTED NONE DETECTED    Comment: (NOTE) DRUG SCREEN FOR MEDICAL PURPOSES ONLY.  IF CONFIRMATION IS NEEDED FOR ANY PURPOSE, NOTIFY LAB WITHIN 5 DAYS.  LOWEST DETECTABLE LIMITS FOR URINE DRUG SCREEN Drug Class                     Cutoff (ng/mL) Amphetamine and metabolites    1000 Barbiturate and metabolites    200 Benzodiazepine                 200 Opiates and metabolites        300 Cocaine and metabolites  300 THC                            50 Performed at [facility], [address], [place], [place] [address]   Blood Culture (routine x 2)     Status: None (Preliminary result)   Collection Time: 06/27/23  2:16 AM   Specimen: BLOOD  Result Value Ref Range   Specimen Description BLOOD SITE NOT SPECIFIED    Special Requests      BOTTLES DRAWN AEROBIC AND ANAEROBIC Blood Culture adequate volume   Culture      NO GROWTH < 12 HOURS Performed at [facility] Lab, [address], [place], [place] [address]    Report Status  PENDING   Comprehensive metabolic panel     Status: Abnormal   Collection Time: 06/27/23  2:19 AM  Result Value Ref Range   Sodium 138 135 - 145 mmol/L   Potassium 3.1 (L) 3.5 - 5.1 mmol/L   Chloride 104 98 - 111 mmol/L   CO2 24 22 - 32 mmol/L   Glucose, Bld 56 (L) 70 - 99 mg/dL    Comment: Glucose reference range applies only to samples taken after fasting for at least 8 hours.   BUN 9 6 - 20 mg/dL   Creatinine, Ser 0.98 0.61 - 1.24 mg/dL   Calcium  8.8 (L) 8.9 - 10.3 mg/dL   Total Protein 7.8 6.5 - 8.1 g/dL   Albumin 2.4 (L) 3.5 - 5.0 g/dL   AST 24 15 - 41 U/L   ALT 12 0 - 44 U/L   Alkaline Phosphatase 100 38 - 126 U/L   Total Bilirubin 1.2 0.0 - 1.2 mg/dL   GFR, Estimated >11 >91 mL/min    Comment: (NOTE) Calculated using the CKD-EPI Creatinine Equation (2021)    Anion gap 10 5 - 15    Comment: Performed at [facility], [address], [place], [place] [address]  CBC with Differential     Status: Abnormal   Collection Time: 06/27/23  2:19 AM  Result Value Ref Range   WBC 6.9 4.0 - 10.5 K/uL   RBC 4.57 4.22 - 5.81 MIL/uL   Hemoglobin 13.0 13.0 - 17.0 g/dL   HCT 56.2 13.0 - 86.5 %   MCV 89.5 80.0 - 100.0 fL   MCH 28.4 26.0 - 34.0 pg   MCHC 31.8 30.0 - 36.0 g/dL   RDW 78.4 (H) 69.6 - 29.5 %   Platelets 298 150 - 400 K/uL   nRBC 0.0 0.0 - 0.2 %   Neutrophils Relative % 69 %   Neutro Abs 4.8 1.7 - 7.7 K/uL   Lymphocytes Relative 13 %   Lymphs Abs 0.9 0.7 - 4.0 K/uL   Monocytes Relative 15 %   Monocytes Absolute 1.0 0.1 - 1.0 K/uL   Eosinophils Relative 2 %   Eosinophils Absolute 0.2 0.0 - 0.5 K/uL   Basophils Relative 1 %   Basophils Absolute 0.1 0.0 - 0.1 K/uL   Immature Granulocytes 0 %   Abs Immature Granulocytes 0.02 0.00 - 0.07 K/uL    Comment: Performed at [facility], [address], [place], [place] [address]  Protime-INR     Status: Abnormal   Collection Time: 06/27/23  2:19 AM  Result Value Ref Range   Prothrombin Time 15.5 (H) 11.4 - 15.2  seconds   INR 1.2 0.8 - 1.2    Comment: (  NOTE) INR goal varies based on device and disease states. Performed at [facility], [address], [place], [place] [address]   Hemoglobin A1c     Status: Abnormal   Collection Time: 06/27/23  2:19 AM  Result Value Ref Range   Hgb A1c MFr Bld 4.5 (L) 4.8 - 5.6 %    Comment: (NOTE) Pre diabetes:          5.7%-6.4%  Diabetes:              >6.4%  Glycemic control for   <7.0% adults with diabetes    Mean Plasma Glucose 82.45 mg/dL    Comment: Performed at [facility] Lab, [address] [place], [place] [address]  Blood Culture (routine x 2)     Status: None (Preliminary result)   Collection Time: 06/27/23  2:25 AM   Specimen: BLOOD  Result Value Ref Range   Specimen Description BLOOD SITE NOT SPECIFIED    Special Requests      BOTTLES DRAWN AEROBIC AND ANAEROBIC Blood Culture adequate volume   Culture      NO GROWTH < 12 HOURS Performed at [facility], [address], [place], [place] [address]    Report Status PENDING   I-Stat Lactic Acid, ED     Status: None   Collection Time: 06/27/23  2:30 AM  Result Value Ref Range   Lactic Acid, Venous 1.8 0.5 - 1.9 mmol/L  Aerobic Culture w Gram Stain (superficial specimen)     Status: None (Preliminary result)   Collection Time: 06/27/23  3:27 AM   Specimen: Leg  Result Value Ref Range   Specimen Description LEG RIGHT    Special Requests NONE    Gram Stain      NO WBC SEEN RARE GRAM POSITIVE COCCI IN SINGLES Performed at [facility], [address], [place], [place] [address]    Culture PENDING    Report Status PENDING   I-Stat Lactic Acid, ED     Status: None   Collection Time: 06/27/23  4:39 AM  Result Value Ref Range   Lactic Acid, Venous 1.0 0.5 - 1.9 mmol/L  CBG monitoring, ED     Status: None   Collection Time: 06/27/23  6:07 AM  Result Value Ref Range   Glucose-Capillary 82 70 - 99 mg/dL    Comment: Glucose reference range applies only to samples  taken after fasting for at least 8 hours.    DG Knee Right Port Result Date: 06/27/2023 CLINICAL DATA:  Septic joint. EXAM: PORTABLE RIGHT KNEE - 1-2 VIEW COMPARISON:  04/20/2023 FINDINGS: Four views study limited by positioning and underpenetration. Sequelae of medial and lateral tibial plateau fracture again noted, status post hardware retrieval. Loss of joint space noted medial compartment with some evidence of bony resorption in the region of the lateral tibial plateau. Probable joint effusion. Soft tissue edema evident. IMPRESSION: 1. Sequelae of medial and lateral tibial plateau fracture, status post hardware retrieval. 2. Probable joint effusion with some evidence of bony resorption in the region of the lateral tibial plateau which may be related to prior trauma although osteomyelitis not excluded. 3. Soft tissue edema. Electronically Signed   By: Donnal Fusi M.D.   On: 06/27/2023 05:01   DG Chest Port 1 View Result Date: 06/27/2023 EXAM: 1 VIEW(S) XRAY OF THE CHEST 06/27/2023 02:04:32 AM COMPARISON: 01/20/2023 CLINICAL HISTORY: Questionable sepsis - evaluate for abnormality. FINDINGS: LUNGS AND PLEURA: Mild interstitial edema. Small  right pleural effusion. No consolidation. No pneumothorax. HEART AND MEDIASTINUM: Cardiomegaly. BONES AND SOFT TISSUES: No acute osseous abnormality. IMPRESSION: 1. Cardiomegaly with mild interstitial edema and small right pleural effusion. Electronically signed by: Zadie Herter MD 06/27/2023 02:06 AM EDT RP Workstation: [id number]    Review of Systems  HENT:  Negative for ear discharge, ear pain, hearing loss and tinnitus.   Eyes:  Negative for photophobia and pain.  Respiratory:  Negative for cough and shortness of breath.   Cardiovascular:  Negative for chest pain.  Gastrointestinal:  Negative for abdominal pain, nausea and vomiting.  Genitourinary:  Negative for dysuria, flank pain, frequency and urgency.  Musculoskeletal:  Positive for arthralgias  (Right knee). Negative for back pain, myalgias and neck pain.  Neurological:  Negative for dizziness and headaches.  Hematological:  Does not bruise/bleed easily.  Psychiatric/Behavioral:  The patient is not nervous/anxious.    Blood pressure (!) 144/96, pulse 91, temperature (!) 97.5 F (36.4 C), temperature source Oral, resp. rate 19, height 6\' 1"  (1.854 m), weight 115.7 kg, SpO2 100%. Physical Exam Constitutional:      General: He is not in acute distress.    Appearance: He is well-developed. He is not diaphoretic.  HENT:     Head: Normocephalic and atraumatic.  Eyes:     General: No scleral icterus.       Right eye: No discharge.        Left eye: No discharge.     Conjunctiva/sclera: Conjunctivae normal.  Cardiovascular:     Rate and Rhythm: Normal rate and regular rhythm.  Pulmonary:     Effort: Pulmonary effort is normal. No respiratory distress.  Musculoskeletal:     Cervical back: Normal range of motion.     Comments: RLE No traumatic wounds, ecchymosis, or rash  Mod TTP knee, severe pain with AROM >15 degrees  No ankle effusion  Sens DPN, SPN, TN intact  Motor EHL, ext, flex, evers 5/5  DP 2+, PT 2+, No significant edema  Skin:    General: Skin is warm and dry.  Neurological:     Mental Status: He is alert.  Psychiatric:        Mood and Affect: Mood normal.        Behavior: Behavior normal.     Assessment/Plan: Right septic knee -- Plan I&D, possible placement of abx spacer. Will have to delay surgery to Wednesday given NPO status.    Georganna Kin, PA-C Orthopedic Surgery [phone] 06/27/2023, 11:27 AM

## 2023-06-27 NOTE — Progress Notes (Signed)
 Pt received PO dose of diltiazem  at 0929; okay to stop diltiazem  gtt at 1100 per Arne Langdon, MD.

## 2023-06-27 NOTE — Progress Notes (Signed)
   06/27/23 1443  SDOH Interventions  Transportation Interventions Inpatient TOC   Resources placed on AVS

## 2023-06-27 NOTE — Plan of Care (Signed)

## 2023-06-27 NOTE — Progress Notes (Signed)
 Patient unable to withstand pain, MRI having to repeat imaging due to movement.   MD notified for IV pain medication however, pt states that he can not continue.  Will transfer patient back to 6N.  Bedside RN made aware of patient returning.

## 2023-06-27 NOTE — Progress Notes (Signed)
 Pharmacy Antibiotic Note  Jonathan Ibarra is a 58 y.o. male admitted on 06/26/2023 with recurrent right septic knee.  Pharmacy has been consulted for vancomycin  dosing.  Received vancomycin  2g IV in ED at 04:59  Plan: Vancomycin  1500mg  IV q12h for estimated AUC 516 using SCr 0.8, Vd 0.5 Check vancomycin  levels at steady state, goal AUC 400-550 Follow up renal function, cultures as available, clinical progress, length of tx  Height: 6\' 1"  (185.4 cm) Weight: 115.7 kg (255 lb) IBW/kg (Calculated) : 79.9  Temp (24hrs), Avg:97.8 F (36.6 C), Min:97.5 F (36.4 C), Max:98.1 F (36.7 C)  Recent Labs  Lab 06/27/23 0219 06/27/23 0230 06/27/23 0439  WBC 6.9  --   --   CREATININE 0.71  --   --   LATICACIDVEN  --  1.8 1.0    Estimated Creatinine Clearance: 137.4 mL/min (by C-G formula based on SCr of 0.71 mg/dL).    Allergies  Allergen Reactions   Tylenol  [Acetaminophen ] Other (See Comments)    Impacts liver    Antimicrobials this admission: 4/28 Vancomycin  >> 4/28 Ceftriaxone  x 1  Dose adjustments this admission:  Microbiology results: 4/28 BCx:  4/28 R Leg: rare GPC in singles  Thank you for allowing pharmacy to be a part of this patient's care.  Armanda Bern, PharmD, BCPS 06/27/2023 2:30 PM  Please check AMION for all [facility] Pharmacy phone numbers After 10:00 PM, call Main Pharmacy [phone]

## 2023-06-27 NOTE — ED Notes (Signed)
 Pt resting in bed watching TV. Pt eating doughnuts that he brought from home. IV infusing with no s/s of infiltration. Pt on monitor and call light within reach

## 2023-06-27 NOTE — Progress Notes (Signed)
 Called to assist getting patient to MRI as patient is on Cardizem  gtt. Noted new order for PO Cardizem , and given by bedside RN with plan to stop IV gtt at noon.  Oral pain meds given.  VS remained stable throughout the transfer.

## 2023-06-27 NOTE — H&P (View-Only) (Signed)
 Reason for Consult:Right knee infection Referring Physician: Arne Langdon Time called: 1111 Time at bedside: 1116   Jonathan Ibarra is an 58 y.o. male.  HPI: Jonathan Ibarra was admitted with likely septic arthritis of the right knee. He has a long hx/o problems since his initial fx. He notes he has not been taking care of it nor going in for his abx as rx. He also ate this morning, last had a doughnut around 1000.  Past Medical History:  Diagnosis Date   Alcohol abuse    Cirrhosis of liver (HCC)    Hypertension    PAF (paroxysmal atrial fibrillation) (HCC) 12/28/2022   SVT (supraventricular tachycardia) (HCC) 09/2022    Past Surgical History:  Procedure Laterality Date   CHOLECYSTECTOMY     HARDWARE REMOVAL Right 04/20/2023   Procedure: HARDWARE REMOVAL Knee;  Surgeon: Laneta Pintos, MD;  Location: [facility] OR;  Service: Orthopedics;  Laterality: Right;   IR TIPS     IRRIGATION AND DEBRIDEMENT KNEE Right 12/29/2022   Procedure: IRRIGATION AND DEBRIDEMENT KNEE;  Surgeon: Laneta Pintos, MD;  Location: [facility] OR;  Service: Orthopedics;  Laterality: Right;   IRRIGATION AND DEBRIDEMENT KNEE Right 01/12/2023   Procedure: IRRIGATION AND DEBRIDEMENT KNEE;  Surgeon: Laneta Pintos, MD;  Location: [facility] OR;  Service: Orthopedics;  Laterality: Right;   ORIF TIBIA PLATEAU Right 12/13/2022   Procedure: OPEN REDUCTION INTERNAL FIXATION (ORIF) TIBIAL PLATEAU;  Surgeon: Laneta Pintos, MD;  Location: [facility] OR;  Service: Orthopedics;  Laterality: Right;   TRANSESOPHAGEAL ECHOCARDIOGRAM (CATH LAB) N/A 04/21/2023   Procedure: TRANSESOPHAGEAL ECHOCARDIOGRAM;  Surgeon: Sheryle Donning, MD;  Location: [facility] INVASIVE CV LAB;  Service: Cardiovascular;  Laterality: N/A;    History reviewed. No pertinent family history.  Social History:  reports that he has been smoking cigarettes. He has never used smokeless tobacco. He reports current drug use. Drugs: Cocaine and Marijuana. He reports that he does not drink  alcohol.  Allergies:  Allergies  Allergen Reactions   Tylenol  [Acetaminophen ] Other (See Comments)    Impacts liver    Medications: I have reviewed the patient's current medications.  Results for orders placed or performed during the hospital encounter of 06/26/23 (from the past 48 hours)  Urine rapid drug screen (hosp performed)     Status: Abnormal   Collection Time: 06/27/23  1:27 AM  Result Value Ref Range   Opiates POSITIVE (A) NONE DETECTED   Cocaine NONE DETECTED NONE DETECTED   Benzodiazepines NONE DETECTED NONE DETECTED   Amphetamines POSITIVE (A) NONE DETECTED    Comment: (NOTE) Trazodone is metabolized in vivo to several metabolites, including pharmacologically active m-CPP, which is excreted in the urine. Immunoassay screens for amphetamines and MDMA have potential cross-reactivity with these compounds and may provide false positive  results.     Tetrahydrocannabinol POSITIVE (A) NONE DETECTED   Barbiturates NONE DETECTED NONE DETECTED    Comment: (NOTE) DRUG SCREEN FOR MEDICAL PURPOSES ONLY.  IF CONFIRMATION IS NEEDED FOR ANY PURPOSE, NOTIFY LAB WITHIN 5 DAYS.  LOWEST DETECTABLE LIMITS FOR URINE DRUG SCREEN Drug Class                     Cutoff (ng/mL) Amphetamine and metabolites    1000 Barbiturate and metabolites    200 Benzodiazepine                 200 Opiates and metabolites        300 Cocaine and metabolites  300 THC                            50 Performed at [facility], [address], [place], [place] [address]   Blood Culture (routine x 2)     Status: None (Preliminary result)   Collection Time: 06/27/23  2:16 AM   Specimen: BLOOD  Result Value Ref Range   Specimen Description BLOOD SITE NOT SPECIFIED    Special Requests      BOTTLES DRAWN AEROBIC AND ANAEROBIC Blood Culture adequate volume   Culture      NO GROWTH < 12 HOURS Performed at [facility], [address], [place], [place] [address]    Report Status  PENDING   Comprehensive metabolic panel     Status: Abnormal   Collection Time: 06/27/23  2:19 AM  Result Value Ref Range   Sodium 138 135 - 145 mmol/L   Potassium 3.1 (L) 3.5 - 5.1 mmol/L   Chloride 104 98 - 111 mmol/L   CO2 24 22 - 32 mmol/L   Glucose, Bld 56 (L) 70 - 99 mg/dL    Comment: Glucose reference range applies only to samples taken after fasting for at least 8 hours.   BUN 9 6 - 20 mg/dL   Creatinine, Ser 0.98 0.61 - 1.24 mg/dL   Calcium  8.8 (L) 8.9 - 10.3 mg/dL   Total Protein 7.8 6.5 - 8.1 g/dL   Albumin 2.4 (L) 3.5 - 5.0 g/dL   AST 24 15 - 41 U/L   ALT 12 0 - 44 U/L   Alkaline Phosphatase 100 38 - 126 U/L   Total Bilirubin 1.2 0.0 - 1.2 mg/dL   GFR, Estimated >11 >91 mL/min    Comment: (NOTE) Calculated using the CKD-EPI Creatinine Equation (2021)    Anion gap 10 5 - 15    Comment: Performed at [facility], [address], [place], [place] [address]  CBC with Differential     Status: Abnormal   Collection Time: 06/27/23  2:19 AM  Result Value Ref Range   WBC 6.9 4.0 - 10.5 K/uL   RBC 4.57 4.22 - 5.81 MIL/uL   Hemoglobin 13.0 13.0 - 17.0 g/dL   HCT 56.2 13.0 - 86.5 %   MCV 89.5 80.0 - 100.0 fL   MCH 28.4 26.0 - 34.0 pg   MCHC 31.8 30.0 - 36.0 g/dL   RDW 78.4 (H) 69.6 - 29.5 %   Platelets 298 150 - 400 K/uL   nRBC 0.0 0.0 - 0.2 %   Neutrophils Relative % 69 %   Neutro Abs 4.8 1.7 - 7.7 K/uL   Lymphocytes Relative 13 %   Lymphs Abs 0.9 0.7 - 4.0 K/uL   Monocytes Relative 15 %   Monocytes Absolute 1.0 0.1 - 1.0 K/uL   Eosinophils Relative 2 %   Eosinophils Absolute 0.2 0.0 - 0.5 K/uL   Basophils Relative 1 %   Basophils Absolute 0.1 0.0 - 0.1 K/uL   Immature Granulocytes 0 %   Abs Immature Granulocytes 0.02 0.00 - 0.07 K/uL    Comment: Performed at [facility], [address], [place], [place] [address]  Protime-INR     Status: Abnormal   Collection Time: 06/27/23  2:19 AM  Result Value Ref Range   Prothrombin Time 15.5 (H) 11.4 - 15.2  seconds   INR 1.2 0.8 - 1.2    Comment: (  NOTE) INR goal varies based on device and disease states. Performed at [facility], [address], [place], [place] [address]   Hemoglobin A1c     Status: Abnormal   Collection Time: 06/27/23  2:19 AM  Result Value Ref Range   Hgb A1c MFr Bld 4.5 (L) 4.8 - 5.6 %    Comment: (NOTE) Pre diabetes:          5.7%-6.4%  Diabetes:              >6.4%  Glycemic control for   <7.0% adults with diabetes    Mean Plasma Glucose 82.45 mg/dL    Comment: Performed at [facility], [address] [place], [place] [address]  Blood Culture (routine x 2)     Status: None (Preliminary result)   Collection Time: 06/27/23  2:25 AM   Specimen: BLOOD  Result Value Ref Range   Specimen Description BLOOD SITE NOT SPECIFIED    Special Requests      BOTTLES DRAWN AEROBIC AND ANAEROBIC Blood Culture adequate volume   Culture      NO GROWTH < 12 HOURS Performed at [facility], [address], [place], [place] [address]    Report Status PENDING   I-Stat Lactic Acid, ED     Status: None   Collection Time: 06/27/23  2:30 AM  Result Value Ref Range   Lactic Acid, Venous 1.8 0.5 - 1.9 mmol/L  Aerobic Culture w Gram Stain (superficial specimen)     Status: None (Preliminary result)   Collection Time: 06/27/23  3:27 AM   Specimen: Leg  Result Value Ref Range   Specimen Description LEG RIGHT    Special Requests NONE    Gram Stain      NO WBC SEEN RARE GRAM POSITIVE COCCI IN SINGLES Performed at [facility], [address], [place], [place] [address]    Culture PENDING    Report Status PENDING   I-Stat Lactic Acid, ED     Status: None   Collection Time: 06/27/23  4:39 AM  Result Value Ref Range   Lactic Acid, Venous 1.0 0.5 - 1.9 mmol/L  CBG monitoring, ED     Status: None   Collection Time: 06/27/23  6:07 AM  Result Value Ref Range   Glucose-Capillary 82 70 - 99 mg/dL    Comment: Glucose reference range applies only to samples  taken after fasting for at least 8 hours.    DG Knee Right Port Result Date: 06/27/2023 CLINICAL DATA:  Septic joint. EXAM: PORTABLE RIGHT KNEE - 1-2 VIEW COMPARISON:  04/20/2023 FINDINGS: Four views study limited by positioning and underpenetration. Sequelae of medial and lateral tibial plateau fracture again noted, status post hardware retrieval. Loss of joint space noted medial compartment with some evidence of bony resorption in the region of the lateral tibial plateau. Probable joint effusion. Soft tissue edema evident. IMPRESSION: 1. Sequelae of medial and lateral tibial plateau fracture, status post hardware retrieval. 2. Probable joint effusion with some evidence of bony resorption in the region of the lateral tibial plateau which may be related to prior trauma although osteomyelitis not excluded. 3. Soft tissue edema. Electronically Signed   By: Donnal Fusi M.D.   On: 06/27/2023 05:01   DG Chest Port 1 View Result Date: 06/27/2023 EXAM: 1 VIEW(S) XRAY OF THE CHEST 06/27/2023 02:04:32 AM COMPARISON: 01/20/2023 CLINICAL HISTORY: Questionable sepsis - evaluate for abnormality. FINDINGS: LUNGS AND PLEURA: Mild interstitial edema. Small  right pleural effusion. No consolidation. No pneumothorax. HEART AND MEDIASTINUM: Cardiomegaly. BONES AND SOFT TISSUES: No acute osseous abnormality. IMPRESSION: 1. Cardiomegaly with mild interstitial edema and small right pleural effusion. Electronically signed by: Zadie Herter MD 06/27/2023 02:06 AM EDT RP Workstation: [id number]    Review of Systems  HENT:  Negative for ear discharge, ear pain, hearing loss and tinnitus.   Eyes:  Negative for photophobia and pain.  Respiratory:  Negative for cough and shortness of breath.   Cardiovascular:  Negative for chest pain.  Gastrointestinal:  Negative for abdominal pain, nausea and vomiting.  Genitourinary:  Negative for dysuria, flank pain, frequency and urgency.  Musculoskeletal:  Positive for arthralgias  (Right knee). Negative for back pain, myalgias and neck pain.  Neurological:  Negative for dizziness and headaches.  Hematological:  Does not bruise/bleed easily.  Psychiatric/Behavioral:  The patient is not nervous/anxious.    Blood pressure (!) 144/96, pulse 91, temperature (!) 97.5 F (36.4 C), temperature source Oral, resp. rate 19, height 6\' 1"  (1.854 m), weight 115.7 kg, SpO2 100%. Physical Exam Constitutional:      General: He is not in acute distress.    Appearance: He is well-developed. He is not diaphoretic.  HENT:     Head: Normocephalic and atraumatic.  Eyes:     General: No scleral icterus.       Right eye: No discharge.        Left eye: No discharge.     Conjunctiva/sclera: Conjunctivae normal.  Cardiovascular:     Rate and Rhythm: Normal rate and regular rhythm.  Pulmonary:     Effort: Pulmonary effort is normal. No respiratory distress.  Musculoskeletal:     Cervical back: Normal range of motion.     Comments: RLE No traumatic wounds, ecchymosis, or rash  Mod TTP knee, severe pain with AROM >15 degrees  No ankle effusion  Sens DPN, SPN, TN intact  Motor EHL, ext, flex, evers 5/5  DP 2+, PT 2+, No significant edema  Skin:    General: Skin is warm and dry.  Neurological:     Mental Status: He is alert.  Psychiatric:        Mood and Affect: Mood normal.        Behavior: Behavior normal.     Assessment/Plan: Right septic knee -- Plan I&D, possible placement of abx spacer. Will have to delay surgery to Wednesday given NPO status.    Georganna Kin, PA-C Orthopedic Surgery [phone] 06/27/2023, 11:27 AM

## 2023-06-27 NOTE — Discharge Instructions (Addendum)
 Orthopaedic Trauma Service Discharge Instructions   Discharge Wound Care Instructions  Do NOT apply any ointments, solutions or lotions to pin sites or surgical wounds.  These prevent needed drainage and even though solutions like hydrogen peroxide kill bacteria, they also damage cells lining the pin sites that help fight infection.  Applying lotions or ointments can keep the wounds moist and can cause them to breakdown and open up as well. This can increase the risk for infection. When in doubt call the office.  Surgical incisions should be dressed daily.  If any drainage is noted, use one layer of adaptic or Mepitel, then gauze, Kerlix, and an ace wrap.  Once the incision is completely dry and without drainage, it may be left open to air out.  Showering may begin 36-48 hours later.  Cleaning gently with soap and water.   General Discharge Instructions  WEIGHT BEARING STATUS:Weightbearing as tolerated  RANGE OF MOTION/ACTIVITY: Ok for knee motion as tolerated  DVT/PE prophylaxis: Aspirin   Diet: as you were eating previously.  Can use over the counter stool softeners and bowel preparations, such as Miralax , to help with bowel movements.  Narcotics can be constipating.  Be sure to drink plenty of fluids  PAIN MEDICATION USE AND EXPECTATIONS  You have likely been given narcotic medications to help control your pain.  After a traumatic event that results in an fracture (broken bone) with or without surgery, it is ok to use narcotic pain medications to help control one's pain.  We understand that everyone responds to pain differently and each individual patient will be evaluated on a regular basis for the continued need for narcotic medications. Ideally, narcotic medication use should last no more than 6-8 weeks (coinciding with fracture healing).   As a patient it is your responsibility as well to monitor narcotic medication use and report the amount and frequency you use these medications  when you come to your office visit.   We would also advise that if you are using narcotic medications, you should take a dose prior to therapy to maximize you participation.  IF YOU ARE ON NARCOTIC MEDICATIONS IT IS NOT PERMISSIBLE TO OPERATE A MOTOR VEHICLE (MOTORCYCLE/CAR/TRUCK/MOPED) OR HEAVY MACHINERY DO NOT MIX NARCOTICS WITH OTHER CNS (CENTRAL NERVOUS SYSTEM) DEPRESSANTS SUCH AS ALCOHOL  POST-OPERATIVE OPIOID TAPER INSTRUCTIONS: It is important to wean off of your opioid medication as soon as possible. If you do not need pain medication after your surgery it is ok to stop day one. Opioids include: Codeine, Hydrocodone(Norco, Vicodin), Oxycodone (Percocet, oxycontin ) and hydromorphone  amongst others.  Long term and even short term use of opiods can cause: Increased pain response Dependence Constipation Depression Respiratory depression And more.  Withdrawal symptoms can include Flu like symptoms Nausea, vomiting And more Techniques to manage these symptoms Hydrate well Eat regular healthy meals Stay active Use relaxation techniques(deep breathing, meditating, yoga) Do Not substitute Alcohol to help with tapering If you have been on opioids for less than two weeks and do not have pain than it is ok to stop all together.  Plan to wean off of opioids This plan should start within one week post op of your fracture surgery  Maintain the same interval or time between taking each dose and first decrease the dose.  Cut the total daily intake of opioids by one tablet each day Next start to increase the time between doses. The last dose that should be eliminated is the evening dose.    STOP SMOKING OR USING  NICOTINE  PRODUCTS!!!!  As discussed nicotine  severely impairs your body's ability to heal surgical and traumatic wounds but also impairs bone healing.  Wounds and bone heal by forming microscopic blood vessels (angiogenesis) and nicotine  is a vasoconstrictor (essentially, shrinks  blood vessels).  Therefore, if vasoconstriction occurs to these microscopic blood vessels they essentially disappear and are unable to deliver necessary nutrients to the healing tissue.  This is one modifiable factor that you can do to dramatically increase your chances of healing your injury.  (This means no smoking, no nicotine  gum, patches, etc)  DO NOT USE NONSTEROIDAL ANTI-INFLAMMATORY DRUGS (NSAID'S)  Using products such as Advil (ibuprofen), Aleve (naproxen), Motrin (ibuprofen) for additional pain control during fracture healing can delay and/or prevent the healing response.  If you would like to take over the counter (OTC) medication, Tylenol  (acetaminophen ) is ok.  However, some narcotic medications that are given for pain control contain acetaminophen  as well. Therefore, you should not exceed more than 4000 mg of tylenol  in a day if you do not have liver disease.  Also note that there are may OTC medicines, such as cold medicines and allergy medicines that my contain tylenol  as well.  If you have any questions about medications and/or interactions please ask your doctor/PA or your pharmacist.      ICE AND ELEVATE INJURED/OPERATIVE EXTREMITY  Using ice and elevating the injured extremity above your heart can help with swelling and pain control.  Icing in a pulsatile fashion, such as 20 minutes on and 20 minutes off, can be followed.    Do not place ice directly on skin. Make sure there is a barrier between to skin and the ice pack.    Using frozen items such as frozen peas works well as the conform nicely to the are that needs to be iced.  USE AN ACE WRAP OR TED HOSE FOR SWELLING CONTROL  In addition to icing and elevation, Ace wraps or TED hose are used to help limit and resolve swelling.  It is recommended to use Ace wraps or TED hose until you are informed to stop.    When using Ace Wraps start the wrapping distally (farthest away from the body) and wrap proximally (closer to the  body)   Example: If you had surgery on your leg or thing and you do not have a splint on, start the ace wrap at the toes and work your way up to the thigh        If you had surgery on your upper extremity and do not have a splint on, start the ace wrap at your fingers and work your way up to the upper arm  CALL THE OFFICE FOR MEDICATION REFILLS OR WITH ANY QUESTIONS/CONCERNS: [phone]   VISIT OUR WEBSITE FOR ADDITIONAL INFORMATION: [email or web address]   Call office for the following: Temperature greater than 101F Persistent nausea and vomiting Severe uncontrolled pain Redness, tenderness, or signs of infection (pain, swelling, redness, odor or green/yellow discharge around the site) Difficulty breathing, headache or visual disturbances Hives Persistent dizziness or light-headedness Extreme fatigue Any other questions or concerns you may have after discharge  In an emergency, call 911 or go to an Emergency Department at a nearby hospital  OTHER HELPFUL INFORMATION  If you had a block, it will wear off between 8-24 hrs postop typically.  This is period when your pain may go from nearly zero to the pain you would have had postop without the block.  This is an abrupt transition  but nothing dangerous is happening.  You may take an extra dose of narcotic when this happens.  You should wean off your narcotic medicines as soon as you are able.  Most patients will be off or using minimal narcotics before their first postop appointment.   We suggest you use the pain medication the first night prior to going to bed, in order to ease any pain when the anesthesia wears off. You should avoid taking pain medications on an empty stomach as it will make you nauseous.  Do not drink alcoholic beverages or take illicit drugs when taking pain medications.  In most states it is against the law to drive while you are in a splint or sling.  And certainly against the law to drive while taking  narcotics.  You may return to work/school in the next couple of days when you feel up to it.   Pain medication may make you constipated.  Below are a few solutions to try in this order: Decrease the amount of pain medication if you aren't having pain. Drink lots of decaffeinated fluids. Drink prune juice and/or each dried prunes  If the first 3 don't work start with additional solutions Take Colace - an over-the-counter stool softener Take Senokot - an over-the-counter laxative Take Miralax  - a stronger over-the-counter laxative   SUNDAYS BREAKFAST TWO LOCATIONS: 8:00am served in [employer] by Awaken PPL Corporation 8:30am SHUTTLE provided from [facility], served at Apache Corporation, [address] LUNCH TWO LOCATIONS [plus one additional third Sunday only] 10:30am - 12:30pm served at [employer], Liberty [employer], [place] [address] (1.2 miles from [facility]) 12:30pm served in [place] by Land O'Lakes Team (THIRD Sunday only) 1:30pm served at [facility] by [facility] one location [plus one additional third Sunday only] 5:00pm Every Sunday, served under the bridge at [address] by Lige Reeve Under the 3M Company (.7 miles from [facility]) (THIRD Sunday ONLY) 4:00pm served in the parking garage, across from [employer], corner of [place] and [address] by Ryland Group Works Ministries MONDAYS BREAKFAST 7:30am served in [employer] by the United States [employer] Corporation and Friends LUNCH 10:30am - 12:30pm served at [employer], [employer], [place] [address] (1.2 miles from [facility]) DINNER TWO LOCATIONS: 7:00pm served in front of the courthouse at the corner of Goldman Sachs and FPL Group. by [place] Monday Night Meal (3 blocks from [facility]) 4:30pm served at the AutoNation, [address] by Bank of New York Company (0.6 miles from [facility]) TUESDAYS BREAKFAST 8:00am - 9:00am served at The TJX Companies, [address] (0.3 miles from [place]) LUNCH 10:30am - 12:30pm served at the Ecolab, Liberty [employer] [address], (1.2 miles from [place]) DINNER 6:00pm served at CSX Corporation, enter from Capital One and go to the Sonic Automotive, (0.7 miles from [place]) [facility] BREAKFAST 7:00am - 8:00am served at [employer], [employer] [address] (1.2 miles from [place]) LUNCH ONE LOCATION [plus two additional locations listed below] 10:30am - 12:30pm served at [employer], [employer] [address] (1.2 miles from [place]) (FIRST Wednesday ONLY) 11:30am served at Dillard's, [place] [address] (6.6 miles from [place]) (SECOND Wednesday ONLY) 11:00am served at R.R. Donnelley. [place]  of Christ, [address] (1.3 miles from [place]) DINNER TWO LOCATIONS 6:00pm served at W. R. Berkley, [place] W. Viste[address]. (1.3 miles from [facility]) 4:00pm - 6:00pm (hot dogs and chips) served at Levi Strauss of [facility], [address] (1.7 miles from [place]) [place] BREAKFAST NOT AVAILABLE AT THIS TIME LUNCH 10:30am - 12:30pm served at [employer], [employer], [place] [address] (1.2 miles from [place]) DINNER 6:00pm served at CSX Corporation, enter from Capital One and go to the Sonic Automotive, (0.7 miles from Nucor Corporation) [place] BREAKFAST NOT AVAILABLE AT THIS TIME LUNCH 10:30am - 12:30pm served at [employer], [employer] [address], (1.2 miles from [place]) DINNER TWO LOCATIONS, [plus one additional first Friday only] 6:00pm served under the bridge at [address] by Lige Reeve Under ConAgra Foods. (.7 miles from [facility]) 5:00pm - 7:00pm served at Levi Strauss of [facility], [address] (1.7 miles from [place]) (FIRST Friday ONLY) 5:45 pm - SHUTTLE provided from the LIBRARY at 5:45pm. Served at [facility], [address]. SATURDAYS BREAKFAST TWO LOCATIONS [plus one additional last Saturday only] 8:00am served at [facility] by Delphi 8:30am served at Pulte Homes, [address] (2.2 miles from [facility]) (LAST Saturday ONLY) 8:30am served at Beazer Homes, [address] (5 miles from [place]) LUNCH 10:30am - 12:30pm served at Ecolab, [employer] [address] (1.2 miles from [facility]) DINNER 6:00pm served under the bridge at [address] by World Fuel Services Corporation (0.7 miles from Nucor Corporation)  DIRECTIONS FROM [place] TO ALL MEAL LOCATIONS The Bridge at [address] (.7 miles from [address]) [address] on [place]. Turn Right onto DIRECTV 433 ft. Continue onto [address] under bridge, about 500 ft. Courthouse (3 blocks from [facility] [place] on [address]. Turn right on [address]  1 block to PPL Corporation (.5 miles from [place]) [place] on [place]. YRC Worldwide. past Brink's Company to EMCOR. Enter from Capital One and go to the Affiliated Computer Services building W. R. Berkley [address] Visteon [address]. (1.3 miles from [facility]) [address] on [place]. Turn Right onto [address]. Mel[address]. church will be on the Left. The TJX Companies [address] (.3 miles from [facility]) Go .3 miles on [address] Destination is on your right Dillard's at ONEOK (6.6 miles from [address]) [address] on [place] toward W Friend[address] Turn right onto [address] Continue onto Alcoa Inc. Continue  onto Toll Brothers. 5. Latina Pol is on right Sanmina-SCI Futures trader) [address] (.6 miles from [place]) [place] on [place]. [address] Turn Left onto [address] 0.3 miles Destination is on the Left. [place] Black & Decker at American Express (5 miles from Nucor Corporation) 1. Head south on [address]. Turn right onto [address] Turn slightly left onto Quest Diagnostics Continue onto Quest Diagnostics Turn right at Barnes & Noble Continue to church on right [place] Sounds of [facility] [address] (1.7 miles from [place]  [address]) [address] on [address] 1.4 miles [place] becomes [address]. Continue 0.6 miles and church will be on theright. [place] Guardian Life Insurance at [address] (2.5 miles from Nucor Corporation) [place] provided from Massachusetts Mutual Life [place]] [place] on [place]. Elm toward Estée Lauder right onto Costco Wholesale left onto Emerson Electric Turn left onto Micron Technology [address] (2.2 miles from [address]) [address] on [place] 1.4 miles [place] becomes [place]. Elm [address] Turn right onto [address] and church will be on the Left. Potter's House/Miller AT&T [address] (1.2 miles from [facility]) 1.Turn right onto [facility] 2.Turn left onto [place] 3.[place] 4.Destination is on your right [place]. [place] of [address] at ToysRus (1.3 miles from [facility]) [address] on [address] Turn left onto Genuine Parts right onto S. [place]. Continue onto KB Home	[place]. Turn left onto Smurfit-Stone Container. Turn right onto WellPoint. [facility] assistance programs. If you are behind on your bills and expenses, and need some help to make it through a short term hardship or financial emergency, there are several organizations and charities in the [place] and [place] area that may be able to help. They range from the Pathmark Stores,  Liberty Global, Landscape architect of [place]  and the local community action agency, the Intel, Avnet. These groups may be able to provide you resources to help pay your utility bills, rent, and they even offer housing assistance.  Crisis assistance program Find help for paying your rent, electric bills, free food, and even funds to pay your mortgage. The Liberty Global 959-140-3460) offers several services to local families, as funding allows. The Emergency Assistance Program (EAP), which they administer, provides household goods, free food, clothing, and financial aid to people in need in the [place]  area. The EAP program does have some qualification, and counselors will interview clients for financial assistance by written referral only. Referrals need to be made by the Department of Social Services or by other EAP approved human services agencies or charities in the area.  Money for resources for emergency assistance are available for security deposits for rent, water, electric, and gas, past due rent, utility bills, past due mortgage payments, food, and clothing. The Liberty Global also operates a Programme researcher, broadcasting/film/video on the site. More Liberty Global.  Open Door Ministries of [place], which can be reached at [phone], offers emergency assistance programs for those in need of help, such as food, rent assistance, a soup kitchen, shelter, and clothing. They are based in [facility] [place]  but provide a number of services to those that qualify for assistance. Continue with Open Door Ministries programs.  [facility] Department of Social Services may be able to offer temporary financial assistance and cash grants for paying rent and utilities. Help may be provided for local county residents who may be experiencing personal crisis when other resources, including government programs, are not available. Call [phone]  [place]. [place] Favor Society, which is based in [place], provides financial assistance of up to $50.00 to help pay for rent, utilities, cooling bills, rent, and prescription medications. The program also provides secondhand furniture to those in need. [phone]  Mattel is a Geneticist, molecular. The organization can offer emergency  assistance for paying rent, electric bills, utilities, food, household products and furniture. They offer extensive emergency and transitional housing for families, children and single women, and also run a Boy's and Dole Food. 301 Thrift Shops, CMS Energy Corporation, and other aid offered too. [address] [place], [place]  [address], [phone]  Additional locations of the Path[employer] Stores are in [place] and other nearby communities. When you have an emergency, need free food, money for basic needs, or just need assistance around Christmas, then the Path[employer] Stores may have the resources you need. Or they can refer you to nearby agencies. Learn more.  [place] Low Income Risk manager - This is offered for [facility] families. The federal government created CIT Group Program provides a one-time cash grant payment to help eligible low-income families pay their electric and heating bills. [address] [place], [place]  [address], [phone]  Government and Motor[employer] - The county administers several emergency and self-sufficiency programs. Residents of [place] can get help with energy bills and food, rent, and other expenses. In addition, work with a Sports coach who may be able to help you find a job or improve your employment skills. More [place] public assistance.  [place] Emergency Assistance - A program offers emergency utility and rent funds for greater [place] area residents. The program can also provide counseling and referrals to charities and  government programs. Also provides food and a free meal program that serves lunch Mondays - Saturdays and dinner seven days per week to individuals in the community. [address] [place], [place]  [address], [phone]  [employer] - Offers affordable apartment and housing communities across [place] and [place]. The low income and seniors can access public housing, rental assistance to qualified applicants, and apply for the section 8 rent subsidy program. Other programs include Chiropractor and Engineer, maintenance. [address] [place], [place]  [address], dial [phone].  Basic needs such as clothing - Low income families can receive free items (school supplies, clothes, holiday assistance, etc.) from clothing closets while more moderate income [address] families can shop at Care[employer] Rx. Locations across the area help the needy. Get information on [place] Tri[place] free clothing centers.  The [facility] provides transitional housing to veterans and the disabled. Clients will also access other services too, including life skills classes, case management, and assistance in finding permanent housing. [address] [place], [place]  [address], call [phone]  [place] Transitional Housing in [place] is for people who were just evicted or that are formerly homeless. The non-profit will also help then gain self-sufficiency, find a home or apartment to live in, and also provides information on rent assistance when needed. Dial [phone]  [employer] Partnership to End Homelessness is available in [place]. Families that were evicted or that are homeless can gain shelter, food, clothing, furniture, and also emergency financial assistance. Other services include financial skills and life skills coaching, job training, and case management. [address]  [place], [place] [address]. Telephone [phone].  The Dynegy, Avnet. runs the [employer] Motor Company. This can help people save money on their heating and summer cooling bills, and is free to low income families. Free upgrades can be made to your home. Phone [phone]  Many of the non-profits and programs mentioned above are all inclusive, meaning they can meet many needs of the low income, such as energy  bills, food, rent, and more. However there are several organizations that focus just on rent and housing. Read more on rent assistance in [place] region.  Legal assistance for evictions, foreclosure, and more If you need free legal advice on civili issues, such as foreclosures, evictions, Electronics engineer, government programs, domestic issues and more, Landscape architect of [facility]) is a Associate Professor firm that provides free legal services and counsel to lower income people, seniors, disabled, and others. The goal is to ensure everyone has access to justice and fair representation.  Call them at ([phone], or click here to learn more about [place]  free legal assistance programs.  [place] [employer]net and funds for emergency expenses The [employer] Stores is another organization that can provide people with Deere & Company and funds to pay bills. Their assistance depends on funding, and the demand for help is always very high. They can provide cash to help pay rent, a missed mortgage payment, or gas, electric, and water bills. But the assistance doesn't stop there. They also have a food pantry on site, which can provide food once every three (3) months to people who need help. The KeyCorp can also offer a Engineering geologist once every three (3) months for a maximum three (3) times. After receiving this voucher over that period of time, applicants can receive this aid one every six (6) months after that. [phone].  [place] System action agency The Intel, [employer]. offers job and Dispensing optician. Resources are focused on helping students obtain the skills and experiences that are necessary to compete in today's challenging and tight job market. The non-profit faith-based community action agency offers internship trainings as well as classroom instruction. Economically disadvantaged and challenged individuals and potential employers can use their services. Classes are tailored to meet the needs of people in the [facility] region. [place], [place] [address], [phone]    Foreclosure prevention services Housing Counseling and Education is also offered by [employer] of the [place]. The agency (phone number is below) is a Engineer, structural providing foreclosure advice and counseling. They offer mortgage resolution counseling and also reverse mortgage counseling. Counselors can direct people to both [employer], as well as   foreclosure assistance options.  Warehouse manager has locations in [place] and [employer]. They run debt and foreclosure prevention programs for local families. A sampling of the programs offered include both Budget and Housing Counseling. This includes money management, financial advice, budget review and development of a written action plan with a Pensions consultant to help solve specific individual financial problems. In addition, housing and mortgage counselors can also provide pre- and post-purchase homeownership counseling, default resolution counseling (to prevent foreclosure) and reverse mortgage counseling. A Debt Management Program allows people and families with a high level of credit card or medical debt to consolidate and repay consumer debt and loans to creditors and rebuild positive credit ratings and scores. [phone]  [phone]  Debt assistance programs Receive free counseling and debt help from [facility] of the [place]. The [facility] based agency can be reached at [phone]. The counselors provide free help, and the services include budget counseling. This will help people manage their expenses and set goals. They also offer a Forensic scientist, which will help individuals consolidate their debts and become debt free. Most of the workshops and services are free.  Community clinics in [place] of the leading health and dental centers are listed below. They may be able to provide medication, physicals, dental care, and general family care to residents of all incomes and backgrounds across the region. Some of the programs focus on the low income and underinsured. However if these clinics can't meet your needs, find information and details on more clinics in [place] .  Some of the options include [employer] [facility] [employer]. This center provides free or low cost health care to low-income adults 18 - 64, who have no health insurance. Among other services offered include a pharmacy and eye clinic. Phone [phone]  [facility], which is located in [place], is a community clinic that provides primary medical and health care to uninsured and underinsured adults and families, as well as the low income, in the greater [place] area on a sliding-fee scale. Call [phone]  [facility] - They run a dental assistance program that is organized by [facility] to provide dental services and aid to [employer]. Services offered by the dental clinic are limited to extractions, pain management, and minor restorative care. [phone]  [facility] has locations in [facility] and [place]. The community clinics provide complete pediatric care including primary health, mental health, social work,  neurology, cardiology, asthma. Dial [phone].  In addition to those [place] and [employer], find other free community clinics in [place]  and across the county.  Food pantry and assistance Some of the local food pantries and distribution centers to call for free food and groceries include [employer] [facility] [place] (phone [phone]), [facility] (phone [phone]) and also [employer]. Dial [phone].  Several other food banks in the region provide clothing, free food and meals, access to soup kitchens and other help. Find the addresses and phone numbers of more food pantries in [place]. http://www.needhelppayingbills.com/html/[place]assistance_pro.html

## 2023-06-27 NOTE — ED Provider Notes (Signed)
 [place] EMERGENCY DEPARTMENT AT [facility] Provider Note   CSN: [id number] Arrival date & time: 06/26/23  2333     History  Chief Complaint  Patient presents with   Joint Swelling    Jonathan Ibarra is a 58 y.o. male.  The history is provided by the patient.  He has history of cirrhosis of the liver, paroxysmal atrial fibrillation not on anticoagulation, status post knee surgery for tibial plateau fracture and infections post surgery.  He comes in with increased pain, swelling, drainage from his right knee.  He states that he had completed the course of antibiotics he had received when he was in the hospital, but when he had his medications refilled, he was told that Medicaid would not pay for additional antibiotics because of the expense.  He relates that he has been compliant with his other medications, but has not done anything for his knee.  He has had increased pain and swelling over the last 3 days with associated subjective fever and chills.  He has pain with any movement.  He has not been taking anything for pain.   Home Medications Prior to Admission medications   Medication Sig Start Date End Date Taking? Authorizing Provider  diltiazem  (CARDIZEM  CD) 180 MG 24 hr capsule Take 1 capsule (180 mg total) by mouth daily. 05/04/23   Macdonald Savoy, MD  folic acid  (FOLVITE ) 1 MG tablet Take 1 tablet (1 mg total) by mouth daily. 02/04/23   Ghimire, Estil Heman, MD  lactulose  (CHRONULAC ) 10 GM/15ML solution Take 30 mLs (20 g total) by mouth 3 (three) times daily. 02/04/23   Ghimire, Estil Heman, MD  methocarbamol  (ROBAXIN ) 750 MG tablet Take 1 tablet (750 mg total) by mouth every 8 (eight) hours as needed for muscle spasms. 05/03/23   Versie Gores, PA-C  metoprolol  succinate (TOPROL -XL) 50 MG 24 hr tablet Take 1 tablet (50 mg total) by mouth daily. Take with or immediately following a meal. 05/04/23   Macdonald Savoy, MD  oxyCODONE  (ROXICODONE ) 15 MG immediate release  tablet Take 1 tablet (15 mg total) by mouth every 4 (four) hours as needed. 05/03/23   Versie Gores, PA-C  thiamine  (VITAMIN B1) 100 MG tablet Take 1 tablet (100 mg total) by mouth daily. 02/04/23   Ghimire, Estil Heman, MD  Vitamin D , Ergocalciferol , (DRISDOL ) 1.25 MG (50000 UNIT) CAPS capsule Take 1 capsule (50,000 Units total) by mouth every 7 (seven) days. 02/11/23   Ghimire, Estil Heman, MD      Allergies    Tylenol  [acetaminophen ]    Review of Systems   Review of Systems  All other systems reviewed and are negative.   Physical Exam Updated Vital Signs BP (!) 145/110   Pulse (!) 120   Temp 97.8 F (36.6 C)   Resp 18   Ht 6\' 1"  (1.854 m)   Wt 115.7 kg   SpO2 91%   BMI 33.64 kg/m  Physical Exam Vitals and nursing note reviewed.   58 year old male, resting comfortably and in no acute distress. Vital signs are significant for rapid heart rate and elevated blood pressure. Oxygen saturation is 91%, which is normal. Head is normocephalic and atraumatic. PERRLA, EOMI. . Lungs are clear without rales, wheezes, or rhonchi. Chest is nontender. Heart is tachycardic and irregular without murmur. Abdomen is soft, flat, nontender. Extremities have 1+ pretibial edema bilaterally.  Right knee is swollen with large effusion present.  There is purulent drainage from the  lateral aspect of the knee where there is a surgical incision with sutures still in place.  The knee is warm to the touch and diffusely tender. Skin is warm and dry without other rash. Neurologic: Awake and alert.  ED Results / Procedures / Treatments   Labs (all labs ordered are listed, but only abnormal results are displayed) Labs Reviewed  COMPREHENSIVE METABOLIC PANEL WITH GFR - Abnormal; Notable for the following components:      Result Value   Potassium 3.1 (*)    Glucose, Bld 56 (*)    Calcium  8.8 (*)    Albumin 2.4 (*)    All other components within normal limits  CBC WITH DIFFERENTIAL/PLATELET - Abnormal;  Notable for the following components:   RDW 16.4 (*)    All other components within normal limits  PROTIME-INR - Abnormal; Notable for the following components:   Prothrombin Time 15.5 (*)    All other components within normal limits  AEROBIC CULTURE W GRAM STAIN (SUPERFICIAL SPECIMEN)  CULTURE, BLOOD (ROUTINE X 2)  CULTURE, BLOOD (ROUTINE X 2)  BODY FLUID CULTURE W GRAM STAIN  GRAM STAIN  URINALYSIS, W/ REFLEX TO CULTURE (INFECTION SUSPECTED)  RAPID URINE DRUG SCREEN, HOSP PERFORMED  SYNOVIAL CELL COUNT + DIFF, W/ CRYSTALS  GLUCOSE, BODY FLUID OTHER            I-STAT CG4 LACTIC ACID, ED  I-STAT CG4 LACTIC ACID, ED  CBG MONITORING, ED    EKG EKG Interpretation Date/Time:  Monday June 27 2023 01:23:00 EDT Ventricular Rate:  158 PR Interval:    QRS Duration:  98 QT Interval:  296 QTC Calculation: 480 R Axis:   -56  Text Interpretation: Atrial fibrillation with rapid ventricular response Left anterior fascicular block Abnormal R-wave progression, late transition Borderline T wave abnormalities Borderline prolonged QT interval When compared with ECG of 04/17/2023, HEART RATE has increased Confirmed by Alissa April ([id number]) on 06/27/2023 1:58:07 AM  Radiology DG Chest Port 1 View Result Date: 06/27/2023 EXAM: 1 VIEW(S) XRAY OF THE CHEST 06/27/2023 02:04:32 AM COMPARISON: 01/20/2023 CLINICAL HISTORY: Questionable sepsis - evaluate for abnormality. FINDINGS: LUNGS AND PLEURA: Mild interstitial edema. Small right pleural effusion. No consolidation. No pneumothorax. HEART AND MEDIASTINUM: Cardiomegaly. BONES AND SOFT TISSUES: No acute osseous abnormality. IMPRESSION: 1. Cardiomegaly with mild interstitial edema and small right pleural effusion. Electronically signed by: Zadie Herter MD 06/27/2023 02:06 AM EDT RP Workstation: [id number]    Procedures Suture Removal  Date/Time: 06/27/2023 4:42 AM  Performed by: Alissa April, MD Authorized by: Alissa April, MD   Consent:    Consent  obtained:  Verbal   Consent given by:  Patient   Risks, benefits, and alternatives were discussed: yes     Risks discussed:  Bleeding, pain and wound separation   Alternatives discussed:  No treatment Universal protocol:    Procedure explained and questions answered to patient or proxy's satisfaction: yes     Relevant documents present and verified: yes     Required blood products, implants, devices, and special equipment available: yes     Site/side marked: yes     Immediately prior to procedure, a time out was called: yes     Patient identity confirmed:  Verbally with patient and arm band Location:    Location:  Lower extremity   Lower extremity location:  Knee   Knee location:  R knee Procedure details:    Wound appearance:  Draining and purulent   Number of sutures removed:  5 Post-procedure  details:    Post-removal:  Dressing applied   Procedure completion:  Tolerated well, no immediate complications .Joint Aspiration/Arthrocentesis  Date/Time: 06/27/2023 4:42 AM  Performed by: Alissa April, MD Authorized by: Alissa April, MD   Consent:    Consent obtained:  Verbal   Consent given by:  Patient   Risks, benefits, and alternatives were discussed: yes     Risks discussed:  Bleeding, infection, pain and incomplete drainage   Alternatives discussed:  No treatment Universal protocol:    Procedure explained and questions answered to patient or proxy's satisfaction: yes     Relevant documents present and verified: yes     Required blood products, implants, devices, and special equipment available: yes     Site/side marked: yes     Immediately prior to procedure, a time out was called: yes     Patient identity confirmed:  Verbally with patient and arm band Location:    Location:  Knee   Knee:  R knee Anesthesia:    Anesthesia method:  Local infiltration   Local anesthetic:  Lidocaine  1% WITH epi Procedure details:    Preparation: Patient was prepped and draped in usual  sterile fashion     Needle gauge:  18 G   Ultrasound guidance: no     Approach:  Medial   Aspirate amount:  0   Steroid injected: no     Specimen collected: no   Post-procedure details:    Procedure completion:  Tolerated well, no immediate complications   Cardiac monitor shows atrial fibrillation with rapid ventricular response, per my interpretation.  Medications Ordered in ED Medications  diltiazem  (CARDIZEM ) 1 mg/mL load via infusion 15 mg (15 mg Intravenous Bolus from Bag 06/27/23 0311)    And  diltiazem  (CARDIZEM ) 125 mg in dextrose  5% 125 mL (1 mg/mL) infusion (7.5 mg/hr Intravenous Rate/Dose Change 06/27/23 0428)  vancomycin  (VANCOREADY) IVPB 2000 mg/400 mL (has no administration in time range)  potassium chloride  SA (KLOR-CON  M) CR tablet 40 mEq (has no administration in time range)  morphine  (PF) 4 MG/ML injection 4 mg (4 mg Intravenous Given 06/27/23 0342)  lidocaine -EPINEPHrine (XYLOCAINE  W/EPI) 2 %-1:200000 (PF) injection 20 mL (20 mLs Infiltration Given 06/27/23 0300)  cefTRIAXone  (ROCEPHIN ) 2 g in sodium chloride  0.9 % 100 mL IVPB (2 g Intravenous New Bag/Given 06/27/23 0405)    ED Course/ Medical Decision Making/ A&P                                 Medical Decision Making Amount and/or Complexity of Data Reviewed Labs: ordered. Radiology: ordered.  Risk Prescription drug management.   Recurrent right knee infection with probable septic joint and concern for sepsis.  I have reviewed his past records, and he was admitted 04/16/2023-05/03/2023 with septic right knee with removal of hardware.  He was supposed to return to the infusion clinic for antibiotics weekly, but canceled the first appointment and was a no-show for the second appointment and infectious disease was unable to contact him.  He needs to have his sutures removed.  I have initiated the evolving sepsis protocol and I will do an arthrocentesis.  For his atrial fibrillation, I have ordered diltiazem  bolus and  drip and may need to add metoprolol .  I have reviewed and interpreted his electrocardiogram, and my interpretation is atrial fibrillation with rapid ventricular response, borderline T wave flattening, unchanged from prior except for rate having increased.  I have  reviewed his laboratory tests, my interpretation is normal CBC, hypokalemia, normal lactic acid level.  I have ordered oral potassium.  Chest x-ray shows cardiomegaly and mild interstitial edema with small right pleural effusion.  Have independently viewed the image, and agree with the radiologist's interpretation.  I have ordered an x-ray of the right knee.  I attempted arthrocentesis but was not able to get any fluid.  I did remove his sutures.  I have discussed the case with Dr. Sundil of [facility], who agrees to admit the patient.  I have sent a secure chat to his orthopedic surgeon Dr. Curtiss Dowdy to have him see the patient in consultation.    CRITICAL CARE Performed by: Alissa April Total critical care time: 65 minutes Critical care time was exclusive of separately billable procedures and treating other patients. Critical care was necessary to treat or prevent imminent or life-threatening deterioration. Critical care was time spent personally by me on the following activities: development of treatment plan with patient and/or surrogate as well as nursing, discussions with consultants, evaluation of patient's response to treatment, examination of patient, obtaining history from patient or surrogate, ordering and performing treatments and interventions, ordering and review of laboratory studies, ordering and review of radiographic studies, pulse oximetry and re-evaluation of patient's condition.  Final Clinical Impression(s) / ED Diagnoses Final diagnoses:  Staphylococcal arthritis of right knee (HCC)  Atrial fibrillation with rapid ventricular response (HCC)  Hypokalemia    Rx / DC Orders ED Discharge Orders     None          Alissa April, MD 06/27/23 [phone]

## 2023-06-27 NOTE — Sepsis Progress Note (Signed)
 Elink following for sepsis protocol.

## 2023-06-28 DIAGNOSIS — T8450XD Infection and inflammatory reaction due to unspecified internal joint prosthesis, subsequent encounter: Secondary | ICD-10-CM | POA: Diagnosis not present

## 2023-06-28 LAB — VANCOMYCIN, PEAK: Vancomycin Pk: 32 ug/mL (ref 30–40)

## 2023-06-28 MED ORDER — CHLORHEXIDINE GLUCONATE 4 % EX SOLN
60.0000 mL | Freq: Once | CUTANEOUS | Status: AC
  Administered 2023-06-28: 4 via TOPICAL
  Filled 2023-06-28: qty 60

## 2023-06-28 MED ORDER — HYDROMORPHONE HCL 1 MG/ML IJ SOLN
1.0000 mg | INTRAMUSCULAR | Status: DC | PRN
  Administered 2023-06-28 – 2023-07-04 (×3): 1 mg via INTRAVENOUS
  Filled 2023-06-28 (×4): qty 1

## 2023-06-28 NOTE — Evaluation (Signed)
 Occupational Therapy Evaluation Patient Details Name: Jonathan Ibarra MRN: [id number] DOB: [birth year] Today's Date: 06/28/2023   History of Present Illness   Pt is a 58 y.o. male admitted 4/27 for c/o of R knee swelling & drainage. Multiple knee surgeries since October d/t infections. Chest x-ray shows cardiomegaly and mild interstitial edema with small right pleural effusion. MRI showed suspicion of septic arthritis & osteomyelitis, degenerative tearing of lateral meniscus PMH: cirrhosis, afib, HTN, SVT, polysubstance abuse,  Pt s/p 04/20/23 removal of hardware, I & D of R knee     Clinical Impressions Pt admitted based on above, and was seen based on problem list below. Pt reporting poor living conditions, that he does not wish to return to upon d/c. PTA pt was independent with ADLs and IADLs. Today pt is requiring set up  to CGA for ADLs. Bed mobility was s for safety and functional transfers are  CGA. Pt with difficulty bearing wt on RLE, but with good UE support able to complete functional mobility. Decreased safety awareness and judgement during mobility. Recommendation of <3 hours of skilled rehab daily. OT will continue to follow acutely to maximize functional independence.        If plan is discharge home, recommend the following:   A little help with walking and/or transfers;[name];Assistance with cooking/housework     Functional Status Assessment   Patient has had a recent decline in their functional status and demonstrates the ability to make significant improvements in function in a reasonable and predictable amount of time.     Equipment Recommendations   Other (comment) (Defer to next venue)     Recommendations for Other Services         Precautions/Restrictions   Precautions Precautions: Fall Recall of Precautions/Restrictions: Intact Restrictions Weight Bearing Restrictions Per Provider Order: No Other  Position/Activity Restrictions: pt self R LE NWB due to R knee pain     Mobility Bed Mobility Overal bed mobility: Needs Assistance Bed Mobility: Sit to Supine       Sit to supine: Supervision, Used rails, [name]   General bed mobility comments: Elevated surface    Transfers Overall transfer level: Needs assistance Equipment used: Rolling walker (2 wheels) Transfers: Sit to/from Stand, Bed to chair/wheelchair/BSC Sit to Stand: Contact guard assist     Step pivot transfers: Contact guard assist     General transfer comment: CGA pt with decreased safety awareness, unable to bear >15% of wt on RLE      Balance Overall balance assessment: Needs assistance Sitting-balance support: Feet supported, Single extremity supported Sitting balance-Leahy Scale: Good       Standing balance-Leahy Scale: Poor Standing balance comment: reliant on UE support         ADL either performed or assessed with clinical judgement   ADL Overall ADL's : Needs assistance/impaired Eating/Feeding: Set up;Sitting   Grooming: Set up;Sitting       Upper Body Dressing : Set up;Sitting   Lower Body Dressing: Contact guard assist;Sit to/from stand Lower Body Dressing Details (indicate cue type and reason): Per pt witnessed pt don pants, increased time to don socks EOB Toilet Transfer: Contact guard assist;Ambulation;Rolling walker (2 wheels) Toilet Transfer Details (indicate cue type and reason): Simulated in room use RW         Functional mobility during ADLs: Contact guard assist;Rolling walker (2 wheels);Cueing for safety General ADL Comments: Pt completed shower with pt prior to OT arrival     Vision Baseline  Vision/History: 0 No visual deficits Vision Assessment?: No apparent visual deficits            Pertinent Vitals/Pain Pain Assessment Pain Assessment: Faces Faces Pain Scale: Hurts whole lot Pain Location: R knee Pain Descriptors / Indicators: Sharp Pain  Intervention(s): Patient requesting pain meds-RN notified, Limited activity within patient's tolerance, Repositioned     Extremity/Trunk Assessment Upper Extremity Assessment Upper Extremity Assessment: Overall WFL for tasks assessed   Lower Extremity Assessment Lower Extremity Assessment: Defer to PT evaluation RLE Deficits / Details: knee swollen, pt unable to flex/extend knee, ankle WFL   Cervical / Trunk Assessment Cervical / Trunk Assessment: Normal   Communication Communication Communication: No apparent difficulties   Cognition Arousal: Alert Behavior During Therapy: Impulsive Cognition: No apparent impairments     OT - Cognition Comments: Decreased safety awareness     Following commands: Intact       Cueing  General Comments   Cueing Techniques: Verbal cues  Pt with discharge coming from R knee and IV bleeding, bandage placed by OT to cover discharge, RN notified           Home Living Family/patient expects to be discharged to:: Private residence Living Arrangements: Non-relatives/Friends Available Help at Discharge: Available PRN/intermittently;Friend(s) Type of Home: House Home Access: Stairs to enter Secretary/administrator of Steps: 2 Entrance Stairs-Rails: Right Home Layout: One level     Bathroom Shower/Tub: Chief Strategy Officer: Standard     Home Equipment: Cane - single Librarian, academic (2 wheels);BSC/3in1;Wheelchair - manual   Additional Comments: pt reports not having water for the last 2 weeks at his home and states "I'm hoping not to go back there but I don't know what options I have"      Prior Functioning/Environment Prior Level of Function : Independent/Modified Independent             Mobility Comments: uses rolling walker, doesn't work ADLs Comments: indep    OT Problem List: Decreased strength;Decreased range of motion;Decreased activity tolerance;Decreased safety awareness;Decreased knowledge of use of  DME or AE   OT Treatment/Interventions: Self-care/ADL training;Therapeutic exercise;DME and/or AE instruction;Therapeutic activities;Balance training;Patient/family education      OT Goals(Current goals can be found in the care plan section)   Acute Rehab OT Goals Patient Stated Goal: To get support for needds upon d/c OT Goal Formulation: With patient Time For Goal Achievement: 07/12/23 Potential to Achieve Goals: Good   OT Frequency:  Min 2X/week       AM-PAC OT "6 Clicks" Daily Activity     Outcome Measure Help from another person eating meals?: None Help from another person taking care of personal grooming?: A Little Help from another person toileting, which includes using toliet, bedpan, or urinal?: A Little Help from another person bathing (including washing, rinsing, drying)?: A Little Help from another person to put on and taking off regular upper body clothing?: A Little Help from another person to put on and taking off regular lower body clothing?: A Little 6 Click Score: 19   End of Session Equipment Utilized During Treatment: Rolling walker (2 wheels) Nurse Communication: Mobility status  Activity Tolerance: Patient limited by pain Patient left: in bed;with call bell/phone within reach;with nursing/sitter in room  OT Visit Diagnosis: Unsteadiness on feet (R26.81);Other abnormalities of gait and mobility (R26.89);Repeated falls (R29.6);Muscle weakness (generalized) (M62.81)                Time: 1610-9604 OT Time Calculation (min): 40 min Charges:  OT General Charges $OT Visit: 1 Visit OT Evaluation $OT Eval Low Complexity: 1 Low OT Treatments $Self Care/Home Management : 23-37 mins  Delmer Ferraris, OT  Acute Rehabilitation Services Office [phone] Secure chat preferred   Mickael Alamo 06/28/2023, 3:43 PM

## 2023-06-28 NOTE — Plan of Care (Signed)

## 2023-06-28 NOTE — TOC Progression Note (Addendum)
 Transition of Care [facility]) - Progression Note    Patient Details  Name: Jonathan Ibarra MRN: [id number] Date of Birth: [birth year]  Transition of Care [facility] CM/SW Contact  Arron Big, [place] Phone Number: 06/28/2023, 4:35 PM  Clinical Narrative:   CSW met patient at bedside and discussed PT recs for SNF. Patient stated he is interested in SNF at this time. CSW explained to patient that due to insurance bed offers may be limited. Patient expressed understanding. Patient stated he does not live with anyone.  5:08 PM SNF workup done. Will need PASRR - NCMUST states patients information is incorrect.   TOC will continue to follow.      Expected Discharge Plan: Skilled Nursing Facility Barriers to Discharge: Continued Medical Work up, SNF Pending bed offer  Expected Discharge Plan and Services   Discharge Planning Services: CM Consult Post Acute Care Choice:  (await PT eval post op) Living arrangements for the past 2 months: Single Family Home                 DME Arranged:  (await post op eval)         HH Arranged:  (see note)           Social Determinants of Health (SDOH) Interventions SDOH Screenings   Food Insecurity: Food Insecurity Present (06/27/2023)  Housing: High Risk (06/27/2023)  Transportation Needs: Unmet Transportation Needs (06/27/2023)  Utilities: Not At Risk (06/27/2023)  Financial Resource Strain: Medium Risk (01/07/2020)   Received from [facility]  Social Connections: Unknown (07/05/2021)   Received from [facility], [facility]  Stress: No Stress Concern Present (01/07/2020)   Received from [facility]  Tobacco Use: High Risk (06/26/2023)    Readmission Risk Interventions     No data to display

## 2023-06-28 NOTE — NC FL2 (Addendum)
 [place]  MEDICAID [id number] LEVEL OF CARE FORM     IDENTIFICATION  Patient Name: Jonathan Ibarra Birthdate: 1966-01-29 Sex: male Admission Date (Current Location): 06/26/2023  [facility] and [place] Number:  Producer, television/film/video and Address:  [facility] [place], [address] [place], [place] [address]      Provider Number: [id number]  Attending Physician Name and Address:  [place], Rema Care, DO  Relative Name and Phone Number:       Current Level of Care: SNF Recommended Level of Care: Skilled Nursing Facility Prior Approval Number:    Date Approved/Denied:   PASRR Number:    Discharge Plan: SNF    Current Diagnoses: Patient Active Problem List   Diagnosis Date Noted   Prosthetic joint infection, subsequent encounter 06/27/2023   Bacteremia 04/21/2023   MRSA bacteremia 04/17/2023   Effusion of right knee joint 04/17/2023   Septic joint of right knee joint (HCC) 04/16/2023   Atrial fibrillation, chronic (HCC) 04/16/2023   Wound infection complicating hardware, subsequent encounter 02/04/2023   Drainage from wound 02/04/2023   Knee effusion, right 12/28/2022   Septic arthritis of knee, right (HCC) 12/28/2022   Fall from bridge, initial encounter 12/13/2022   Atrial fibrillation with rapid ventricular response (HCC) 10/01/2022   SIRS without infection with organ dysfunction (HCC) 10/01/2022   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   Transaminitis 02/07/2015   Polysubstance abuse (HCC) 02/06/2015   Hypokalemia 02/06/2015   Hepatic encephalopathy (HCC) 02/04/2015   Thrombocytopenia (HCC) 02/04/2015   Essential hypertension 02/04/2015   Hepatic cirrhosis (HCC) 02/04/2015    Orientation RESPIRATION BLADDER Height & Weight     Self, Time, Situation, Place  Normal Continent Weight: 255 lb (115.7 kg) Height:  6\' 1"  (185.4 cm)  BEHAVIORAL SYMPTOMS/MOOD NEUROLOGICAL BOWEL NUTRITION STATUS      Continent Diet (see dc summary)   AMBULATORY STATUS COMMUNICATION OF NEEDS Skin   Extensive Assist Verbally Normal                       Personal Care Assistance Level of Assistance  Bathing, Dressing, Feeding Bathing Assistance: Limited assistance Feeding assistance: Independent Dressing Assistance: Limited assistance     Functional Limitations Info  Sight, Speech, Hearing Sight Info: Adequate Hearing Info: Adequate Speech Info: Adequate    SPECIAL CARE FACTORS FREQUENCY  PT (By licensed PT), OT (By licensed OT)     PT Frequency: 5x week OT Frequency: 5x week            Contractures Contractures Info: Not present    Additional Factors Info  Code Status, Allergies, Isolation Precautions Code Status Info: Full Allergies Info: Tylenol  (acetaminophen )     Isolation Precautions Info: Contact pre, ESBL, MRSA     Current Medications (06/28/2023):  This is the current hospital active medication list Current Facility-Administered Medications  Medication Dose Route Frequency Provider Last Rate Last Admin   diltiazem  (CARDIZEM  CD) 24 hr capsule 180 mg  180 mg Oral Daily Arne Langdon, MD   180 mg at 06/28/23 0949   enoxaparin  (LOVENOX ) injection 40 mg  40 mg Subcutaneous Q24H Arne Langdon, MD   40 mg at 06/28/23 1319   HYDROmorphone  (DILAUDID ) injection 1 mg  1 mg Intravenous Q4H PRN [place], Eric J, DO   1 mg at 06/28/23 8119   methocarbamol  (ROBAXIN ) tablet 750 mg  750 mg Oral Q8H PRN Arne Langdon, MD   750 mg at 06/27/23 2055   metoprolol   succinate (TOPROL -XL) 24 hr tablet 50 mg  50 mg Oral Daily Moore, Willie, MD   50 mg at 06/28/23 1610   mupirocin  ointment (BACTROBAN ) 2 % 1 Application  1 Application Nasal BID Arne Langdon, MD   1 Application at 06/28/23 0955   oxyCODONE  (Oxy IR/ROXICODONE ) immediate release tablet 15 mg  15 mg Oral Q4H PRN Arne Langdon, MD   15 mg at 06/28/23 1319   vancomycin  (VANCOREADY) IVPB 1500 mg/300 mL  1,500 mg Intravenous Q12H Williamson, Erin R, RPH 150 mL/hr at  06/28/23 0534 1,500 mg at 06/28/23 0534     Discharge Medications: Please see discharge summary for a list of discharge medications.  Relevant Imaging Results:  Relevant Lab Results:   Additional Information SSN [id number] [place], LCSWA

## 2023-06-28 NOTE — Evaluation (Signed)
 Physical Therapy Evaluation Patient Details Name: Jonathan Ibarra MRN: [id number] DOB: August 01, 1965 Today's Date: 06/28/2023  History of Present Illness  Pt is a 58 y.o. male admitted 4/27 for c/o of R knee swelling & drainage. Multiple knee surgeries since October d/t infections. Chest x-ray shows cardiomegaly and mild interstitial edema with small right pleural effusion. MRI showed suspicion of septic arthritis & osteomyelitis, degenerative tearing of lateral meniscus PMH: cirrhosis, afib, HTN, SVT, polysubstance abuse,  Pt s/p 04/20/23 removal of hardware, I & D of R knee   Clinical Impression  Pt admitted with above. Pt unfortunately with poor living situation and lacks ability to properly care for self. At this time pt unable to ambulate due to R knee pain and limit tolerance to "hopping on one foot" with rolling walker. Pts current walker in poor condition and would benefit from a new one for improved safety with ambulation. Pt currently in a home without water and reports being frustrated with lack of resources. Pt to benefit from inpatient rehab program < 3 hrs a day to achieve safe mod I level of function. Aware pt has planned surgery for I&D of R knee tomorrow (Wednesday). Acute PT to return as able, as appropriate post surgery to assess mobility.      If plan is discharge home, [name];[name];Assist for transportation;Help with stairs or ramp for entrance   Can travel by private vehicle   Yes    Equipment Recommendations Rolling walker (2 wheels) (has one but not in good shape)  Recommendations for Other Services       Functional Status Assessment Patient has had a recent decline in their functional status and demonstrates the ability to make significant improvements in function in a reasonable and predictable amount of time.     Precautions / Restrictions Precautions Precautions:  Fall Restrictions Weight Bearing Restrictions Per Provider Order: No Other Position/Activity Restrictions: pt self R LE NWB due to R knee pain      Mobility  Bed Mobility               General bed mobility comments: pt received sitting on BSC in shower    Transfers Overall transfer level: Needs assistance Equipment used: Rolling walker (2 wheels) Transfers: Sit to/from Stand Sit to Stand: Contact guard assist           General transfer comment: contact guard for safety as pt deferred any physical assist. Pt with poor safety awareness and trying to hop on L LE on wet floor from shower with towels all over floor    Ambulation/Gait               General Gait Details: pt limited to hopping from bathroom to bed due to R LE pain at 10/10. pt unable to tolerate any weight on R LE and demo'd hop to pattern but can only tolerate very short distances  Careers information officer     Tilt Bed    Modified Rankin (Stroke Patients Only)       Balance Overall balance assessment: Needs assistance Sitting-balance support: Feet supported, Single extremity supported Sitting balance-Leahy Scale: Good       Standing balance-Leahy Scale: Poor Standing balance comment: reliant on UE support/external support. pt attempted to don shorts in standing  however then needs to sit  Pertinent Vitals/Pain Pain Assessment Pain Assessment: 0-10 Pain Score: 10-Worst pain ever Pain Location: R knee Pain Descriptors / Indicators: Sharp Pain Intervention(s): Premedicated before session, Monitored during session    Home Living Family/patient expects to be discharged to:: Private residence Living Arrangements: Non-relatives/Friends Available Help at Discharge: Available PRN/intermittently;Friend(s) Type of Home: House Home Access: Stairs to enter Entrance Stairs-Rails: Right Entrance Stairs-Number of Steps: 2   Home  Layout: One level Home Equipment: Cane - single Librarian, academic (2 wheels);BSC/3in1;Wheelchair - manual Additional Comments: pt reports not having water for the last 2 weeks at his home and states "I'm hoping not to go back there but I don't know what options I have"    Prior Function Prior Level of Function : Independent/Modified Independent             Mobility Comments: uses rolling walker, doesn't work ADLs Comments: indep     Extremity/Trunk Assessment   Upper Extremity Assessment Upper Extremity Assessment: Defer to OT evaluation    Lower Extremity Assessment Lower Extremity Assessment: RLE deficits/detail RLE Deficits / Details: knee swollen, pt unable to flex/extend knee, ankle WFL    Cervical / Trunk Assessment Cervical / Trunk Assessment: Normal  Communication   Communication Communication: No apparent difficulties    Cognition Arousal: Alert Behavior During Therapy: Impulsive   PT - Cognitive impairments: No apparent impairments                       PT - Cognition Comments: suspect pt impulsive at baseline with decreased insight to health Following commands: Intact       Cueing Cueing Techniques: Verbal cues     General Comments General comments (skin integrity, edema, etc.): pt with discolored bilat LEs, R knee swelling and redness    Exercises     Assessment/Plan    PT Assessment Patient needs continued PT services  PT Problem List Decreased activity tolerance;Decreased balance;Decreased strength;Decreased mobility;Decreased safety awareness       PT Treatment Interventions DME instruction;Gait training;Stair training;Functional mobility training;Therapeutic activities;Therapeutic exercise;Balance training    PT Goals (Current goals can be found in the Care Plan section)  Acute Rehab PT Goals Patient Stated Goal: get better PT Goal Formulation: With patient Time For Goal Achievement: 07/13/23 Potential to Achieve Goals:  Good    Frequency Min 2X/week     Co-evaluation               AM-PAC PT "6 Clicks" Mobility  Outcome Measure Help needed turning from your back to your side while in a flat bed without using bedrails?: None Help needed moving from lying on your back to sitting on the side of a flat bed without using bedrails?: None Help needed moving to and from a bed to a chair (including a wheelchair)?: A Little Help needed standing up from a chair using your arms (e.g., wheelchair or bedside chair)?: A Little Help needed to walk in hospital room?: Total (distance) Help needed climbing 3-5 steps with a railing? : Total 6 Click Score: 16    End of Session Equipment Utilized During Treatment: Gait belt Activity Tolerance: Patient limited by pain Patient left:  (sitting EOB with OT) Nurse Communication: Mobility status PT Visit Diagnosis: Unsteadiness on feet (R26.81);Pain Pain - Right/Left: Right Pain - part of body: Knee    Time: 4098-1191 PT Time Calculation (min) (ACUTE ONLY): 20 min   Charges:   PT Evaluation $PT Eval Low Complexity: 1 Low   PT General  Charges $$ ACUTE PT VISIT: 1 Visit         Renaee Caro, PT, DPT Acute Rehabilitation Services Secure chat preferred Office #: [phone]   Jenna Moan 06/28/2023, 3:30 PM

## 2023-06-28 NOTE — Progress Notes (Addendum)
 PROGRESS NOTE    [name]  LTJ:[id number] DOB: [birth year] DOA: 06/26/2023 PCP: Sharry Deem, MD    Brief Narrative:   Jonathan Ibarra is a 58 y.o. male with past medical history significant for tibial plateau fracture, right knee septic arthritis with hardware removal, I&D, wound VAC placement February 2025, HTN, SVT, paroxysmal atrial fibrillation not on anticoagulation, cirrhosis s/p TIPS, chronic thrombocytopenia, anemia of chronic medical disease, polysubstance abuse who presented to [facility] ED on 06/26/2023 with right knee pain, swelling, drainage.  Reports in his usual state of health until this past week when he noticed increased pain, swelling, erythema and puslike drainage from his right knee.  Admitting to "not taking care of his knee".  Did not report to the infusion clinic for antibiotic administration March 2025 as planned and has not followed up with ID.  In the ED, temperature 97.8 F, HR 153, RR 26, BP 126/91, SpO2 100% on room air.  WBC 6.9, hemoglobin 13.0, platelet count 298.  Sodium 138, potassium 3.1, chloride 104, CO2 24, glucose 56, BUN 9, creatinine 0.71.  AST 24, ALT 12, total bilirubin 1.2.  INR 1.2.  UDS positive for amphetamines, opiates, THC.  Chest x-ray with cardiomegaly with mild interstitial edema and small right pleural effusion.  Right knee x-ray with sequelae of medial/lateral tibial plateau fracture status post hardware removal, probable joint effusion with some evidence of bony reabsorption in the lateral tibial plateau related to prior trauma versus osteomyelitis.  MR right knee without contrast with enlarging complex knee joint effusion with diffuse periarticular marrow edema suspicious for septic arthritis and osteomyelitis, probable associated intraosseous abscess proximal tibia with fistulous tract communicating with a lateral soft tissue component, extensive periarticular soft tissue edema surrounding the knee.  Blood cultures x 2 obtained.  Patient  was started on a Cardizem  drip for A-fib with RVR.  Orthopedics consulted.  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  Assessment & Plan:   Right knee septic arthritis with abscess Concern for osteomyelitis Patient presenting with 1 week history of progressive right knee swelling, erythema, pain with purulent discharge.  Noncompliant with outpatient therapy including IV antibiotics and follow-up with infectious disease.  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  Patient is afebrile without leukocytosis.  MR right knee with findings consistent with acute septic arthritis with concern for osteomyelitis and abscess formation.  Seen by orthopedics, Dr. Curtiss Dowdy who initially recommended AKA, but patient declined with planned I&D on 4/30. -- Orthopedics ([facility]) and infectious disease following, appreciate assistance -- Blood cultures x 2: No growth x 1 day -- Superficial leg culture: + GPC singles; further pending -- Vancomycin , pharmacy consulted for dosing/monitoring -- Oxycodone  50 mg p.o. every 4 hours as needed severe pain -- Robaxin  750 mg p.o. every 8 hours as needed muscle spasms -- Dilaudid  1 mg IV every 4 hours as needed severe pain not relieved with oral medication -- N.p.o. after midnight for planned I&D by orthopedics on 4/30  Paroxysmal atrial fibrillation with RVR Essential hypertension Patient presenting with elevated heart rate of 153.  Initially placed on Cardizem  drip.  Home medications restarted and was able to be titrated off.  Not on anticoagulation outpatient. -- Diltiazem  180 mg p.o. daily -- Metoprolol  succinate 50 mg p.o. daily -- Heart rate remains well-controlled now, discontinue telemetry  Cirrhosis s/p TIPS -- Low-salt diet -- Outpatient follow-up  with GI  Anemia of chronic medical disease -- Hgb 13.0,  stable.  Polysubstance use UDS positive for amphetamines, opiates, THC.  Counseled on need for complete abstinence/cessation from illicit substances.   DVT prophylaxis: enoxaparin  (LOVENOX ) injection 40 mg Start: 06/27/23 1200    Code Status: Full Code Family Communication: No family present at bedside this morning  Disposition Plan:  Level of care: Progressive Status is: Inpatient Remains inpatient appropriate because: Plan I&D by orthopedics tomorrow, IV antibiotics    Consultants:  Orthopedics, Dr. Curtiss Dowdy Infectious disease  Procedures:  None  Antimicrobials:  Vancomycin  4/27>> Ceftriaxone  4/27 - 4/27   Subjective: Patient seen examined bedside, lying in bed.  Complaining of knee pain not relieved with oral medications.  Also requesting ability to take a shower this morning.  Discussed with patient highly concerned about how he will do overall with just I&D of his knee joint with recommendations by orthopedics for AKA.  No other Spenser questions, concerns or complaints at this time.  Denies headache, no dizziness, no chest pain, no palpitations, no shortness of breath, no abdominal pain, no fever/chills/night sweats, no nausea/vomiting/diarrhea, no focal weakness, no fatigue, no paresthesias.  No acute events overnight per nursing staff.  Objective: Vitals:   06/27/23 1607 06/27/23 2015 06/28/23 0340 06/28/23 0909  BP: 103/70 112/76 110/75 118/85  Pulse: 65 68 64 73  Resp: 18 16 17 17   Temp: 97.7 F (36.5 C) 98.7 F (37.1 C) 98.3 F (36.8 C) 97.9 F (36.6 C)  TempSrc: Oral  Oral Oral  SpO2: 99% 100%  97%  Weight:      Height:        Intake/Output Summary (Last 24 hours) at 06/28/2023 1048 Last data filed at 06/28/2023 0900 Gross per 24 hour  Intake 1339.52 ml  Output 500 ml  Net 839.52 ml   Filed Weights   06/26/23 2340  Weight: 115.7 kg    Examination:  Physical Exam: GEN: NAD, alert and oriented x 3, chronically ill-appearing, appears older than  stated age HEENT: NCAT, PERRL, EOMI, sclera clear, MMM PULM: CTAB w/o wheezes/crackles, normal respiratory effort, on room air CV: RRR w/o M/G/R GI: abd soft, NTND, + BS MSK: Right knee with edema, TTP, purulent discharge; moves all extremities independently with preserved muscle strength NEURO: CN II-XII intact, no focal deficits, sensation to light touch intact PSYCH: normal mood/affect Integumentary: Right knee with edema, erythema, purulent discharge, as depicted below; otherwise no other concerning rashes/lesions/wounds noted on exposed skin surfaces    Data Reviewed: I have personally reviewed following labs and imaging studies  CBC: Recent Labs  Lab 06/27/23 0219  WBC 6.9  NEUTROABS 4.8  HGB 13.0  HCT 40.9  MCV 89.5  PLT 298   Basic Metabolic Panel: Recent Labs  Lab 06/27/23 0219  NA 138  K 3.1*  CL 104  CO2 24  GLUCOSE 56*  BUN 9  CREATININE 0.71  CALCIUM  8.8*   GFR: Estimated Creatinine Clearance: 137.4 mL/min (by C-G formula based on SCr of 0.71 mg/dL). Liver Function Tests: Recent Labs  Lab 06/27/23 0219  AST 24  ALT 12  ALKPHOS 100  BILITOT 1.2  PROT 7.8  ALBUMIN 2.4*   No results for input(s): "LIPASE", "AMYLASE" in the last 168 hours. No results for input(s): "AMMONIA" in the last 168 hours. Coagulation Profile: Recent Labs  Lab 06/27/23 0219  INR 1.2   Cardiac Enzymes: No results for input(s): "CKTOTAL", "CKMB", "CKMBINDEX", "TROPONINI" in the last 168 hours. BNP (last 3  results) No results for input(s): "PROBNP" in the last 8760 hours. HbA1C: Recent Labs    06/27/23 0219  HGBA1C 4.5*   CBG: Recent Labs  Lab 06/27/23 0607  GLUCAP 82   Lipid Profile: No results for input(s): "CHOL", "HDL", "LDLCALC", "TRIG", "CHOLHDL", "LDLDIRECT" in the last 72 hours. Thyroid Function Tests: No results for input(s): "TSH", "T4TOTAL", "FREET4", "T3FREE", "THYROIDAB" in the last 72 hours. Anemia Panel: No results for input(s): "VITAMINB12",  "FOLATE", "FERRITIN", "TIBC", "IRON", "RETICCTPCT" in the last 72 hours. Sepsis Labs: Recent Labs  Lab 06/27/23 0230 06/27/23 0439  LATICACIDVEN 1.8 1.0    Recent Results (from the past 240 hours)  Blood Culture (routine x 2)     Status: None (Preliminary result)   Collection Time: 06/27/23  2:16 AM   Specimen: BLOOD  Result Value Ref Range Status   Specimen Description BLOOD SITE NOT SPECIFIED  Final   Special Requests   Final    BOTTLES DRAWN AEROBIC AND ANAEROBIC Blood Culture adequate volume   Culture   Final    NO GROWTH 1 DAY Performed at [facility], [address] [place], [place] [address]    Report Status PENDING  Incomplete  Blood Culture (routine x 2)     Status: None (Preliminary result)   Collection Time: 06/27/23  2:25 AM   Specimen: BLOOD  Result Value Ref Range Status   Specimen Description BLOOD SITE NOT SPECIFIED  Final   Special Requests   Final    BOTTLES DRAWN AEROBIC AND ANAEROBIC Blood Culture adequate volume   Culture   Final    NO GROWTH 1 DAY Performed at [facility], [address], [place], [place] [address]    Report Status PENDING  Incomplete  Aerobic Culture w Gram Stain (superficial specimen)     Status: None (Preliminary result)   Collection Time: 06/27/23  3:27 AM   Specimen: Leg  Result Value Ref Range Status   Specimen Description LEG RIGHT  Final   Special Requests NONE  Final   Gram Stain NO WBC SEEN RARE GRAM POSITIVE COCCI IN SINGLES   Final   Culture   Final    MODERATE STAPHYLOCOCCUS AUREUS SUSCEPTIBILITIES TO FOLLOW Performed at [facility], [address] [place], [place] [address]    Report Status PENDING  Incomplete  Surgical PCR screen     Status: Abnormal   Collection Time: 06/27/23  4:03 PM   Specimen: Nasal Mucosa; Nasal Swab  Result Value Ref Range Status   MRSA, PCR POSITIVE (A) NEGATIVE Final    Comment: RESULT CALLED TO, READ BACK BY AND VERIFIED WITH: RN CLINT WOOTEN ON 06/27/23 @ 1818  BY DRT    Staphylococcus aureus POSITIVE (A) NEGATIVE Final    Comment: (NOTE) The Xpert SA Assay (FDA approved for NASAL specimens in patients 7 years of age and older), is one component of a comprehensive surveillance program. It is not intended to diagnose infection nor to guide or monitor treatment. Performed at [facility], [address] [place], [place] [address]          Radiology Studies: MR KNEE RIGHT WO CONTRAST Result Date: 06/27/2023 CLINICAL DATA:  Septic arthritis suspected. Previous tibial plateau fracture with ORIF and hardware removal. Increasing pain after running out of medication. EXAM: MRI OF THE RIGHT KNEE WITHOUT CONTRAST TECHNIQUE: Multiplanar, multisequence MR imaging of the knee was performed. No intravenous contrast was administered. COMPARISON:  Radiographs 06/27/2023 and 04/20/2023.  CT 04/18/2023. FINDINGS: Technical note: Despite efforts by the technologist and patient, moderate motion artifact is present on today's exam and could not be eliminated. This reduces exam sensitivity and specificity. Patient was unable to complete the examination. No postcontrast imaging was performed. Bones/Joint/Cartilage Previously demonstrated proximal tibial plate and screws have been removed in the interval. Again demonstrated are extensive underlying fractures of both tibial plateaus without evidence of interval healing. There is progressive depression and irregularity of the lateral tibial plateau with an intraosseous fluid collection laterally in the proximal tibial metaphysis, measuring approximately 3.8 x 2.8 x 4.9 cm, suspicious for an intraosseous abscess. There is diffusely increased T1 and decreased T1 marrow signal within the proximal tibia and distal femur, suspicious for osteomyelitis. No gross abnormality of the patella or proximal fibula is seen. There is a large complex knee joint effusion which has enlarged from the previous CT. Ligaments The cruciate and  collateral ligaments appear grossly intact. Probable diffuse degenerative tearing of the body and anterior horn of the lateral meniscus. There is free edge blunting of the medial meniscus without displaced meniscal fragment. Muscles and Tendons The quadriceps and patellar tendons appear intact. The muscles surrounding the knee are diffusely edematous. Soft tissue As above, enlarging complex knee joint effusion. There is a focal fluid collection along the anterolateral aspect of the proximal tibia, which appears to communicate with the intraosseous fluid collection described above via a fistulous tract. The soft tissue component of this fluid collection measures approximately 5.7 x 3.5 x 3.3 cm. No other focal fluid collections are identified, although there is extensive periarticular soft tissue edema surrounding the knee. IMPRESSION: 1. Removal of proximal tibial plate and screws on 04/20/2023. 2. Enlarging complex knee joint effusion with diffuse periarticular marrow edema highly suspicious for septic arthritis and osteomyelitis. There is a probable associated intraosseous abscess laterally in the proximal tibia with a fistulous track communicating with a lateral soft tissue component. 3. Extensive periarticular soft tissue edema surrounding the knee. 4. Probable diffuse degenerative tearing of the body and anterior horn of the lateral meniscus. Free edge blunting of the medial meniscus without displaced meniscal fragment. Electronically Signed   By: Elmon Hagedorn M.D.   On: 06/27/2023 12:00   DG Knee Right Port Result Date: 06/27/2023 CLINICAL DATA:  Septic joint. EXAM: PORTABLE RIGHT KNEE - 1-2 VIEW COMPARISON:  04/20/2023 FINDINGS: Four views study limited by positioning and underpenetration. Sequelae of medial and lateral tibial plateau fracture again noted, status post hardware retrieval. Loss of joint space noted medial compartment with some evidence of bony resorption in the region of the lateral  tibial plateau. Probable joint effusion. Soft tissue edema evident. IMPRESSION: 1. Sequelae of medial and lateral tibial plateau fracture, status post hardware retrieval. 2. Probable joint effusion with some evidence of bony resorption in the region of the lateral tibial plateau which may be related to prior trauma although osteomyelitis not excluded. 3. Soft tissue edema. Electronically Signed   By: Donnal Fusi M.D.   On: 06/27/2023 05:01   DG Chest Port 1 View Result Date: 06/27/2023 EXAM: 1 VIEW(S) XRAY OF THE CHEST 06/27/2023 02:04:32 AM COMPARISON: 01/20/2023 CLINICAL HISTORY: Questionable sepsis - evaluate for abnormality. FINDINGS: LUNGS AND PLEURA: Mild interstitial edema. Small right pleural effusion. No consolidation. No pneumothorax. HEART AND MEDIASTINUM: Cardiomegaly. BONES AND SOFT TISSUES: No acute osseous abnormality. IMPRESSION: 1. Cardiomegaly with mild interstitial edema and small right pleural effusion. Electronically signed by: Zadie Herter MD 06/27/2023 02:06 AM EDT RP Workstation: [id number]  Scheduled Meds:  diltiazem   180 mg Oral Daily   enoxaparin  (LOVENOX ) injection  40 mg Subcutaneous Q24H   metoprolol  succinate  50 mg Oral Daily   mupirocin  ointment  1 Application Nasal BID   Continuous Infusions:  vancomycin  1,500 mg (06/28/23 0534)     LOS: 1 day    Time spent: 52 minutes spent on 06/28/2023 caring for this patient face-to-face including chart review, ordering labs/tests, documenting, discussion with nursing staff, consultants, updating family and interview/physical exam    Rema Care [place], DO [facility] Available via Epic secure chat 7am-7pm After these hours, please refer to coverage provider listed on [email or web address] 06/28/2023, 10:48 AM

## 2023-06-29 ENCOUNTER — Inpatient Hospital Stay (HOSPITAL_COMMUNITY)

## 2023-06-29 ENCOUNTER — Inpatient Hospital Stay (HOSPITAL_COMMUNITY): Payer: Self-pay | Admitting: Certified Registered Nurse Anesthetist

## 2023-06-29 ENCOUNTER — Other Ambulatory Visit: Payer: Self-pay

## 2023-06-29 ENCOUNTER — Encounter (HOSPITAL_COMMUNITY): Payer: Self-pay | Admitting: Internal Medicine

## 2023-06-29 ENCOUNTER — Encounter (HOSPITAL_COMMUNITY): Payer: Self-pay | Admitting: Hospitalist

## 2023-06-29 ENCOUNTER — Encounter (HOSPITAL_COMMUNITY)
Admission: EM | Disposition: A | Discharge status: Skilled Nursing Facility | Payer: Self-pay | Source: Home / Self Care | Attending: Internal Medicine

## 2023-06-29 ENCOUNTER — Other Ambulatory Visit (HOSPITAL_COMMUNITY): Payer: Self-pay

## 2023-06-29 DIAGNOSIS — I10 Essential (primary) hypertension: Secondary | ICD-10-CM | POA: Diagnosis not present

## 2023-06-29 DIAGNOSIS — I4891 Unspecified atrial fibrillation: Secondary | ICD-10-CM

## 2023-06-29 DIAGNOSIS — T8450XD Infection and inflammatory reaction due to unspecified internal joint prosthesis, subsequent encounter: Secondary | ICD-10-CM | POA: Diagnosis not present

## 2023-06-29 DIAGNOSIS — K7460 Unspecified cirrhosis of liver: Secondary | ICD-10-CM | POA: Diagnosis not present

## 2023-06-29 DIAGNOSIS — M869 Osteomyelitis, unspecified: Secondary | ICD-10-CM | POA: Diagnosis not present

## 2023-06-29 HISTORY — PX: IRRIGATION AND DEBRIDEMENT KNEE: SHX5185

## 2023-06-29 LAB — COMPREHENSIVE METABOLIC PANEL WITH GFR
ALT: 9 U/L (ref 0–44)
AST: 16 U/L (ref 15–41)
Albumin: 1.9 g/dL — ABNORMAL LOW (ref 3.5–5.0)
Alkaline Phosphatase: 76 U/L (ref 38–126)
Anion gap: 6 (ref 5–15)
BUN: 13 mg/dL (ref 6–20)
CO2: 26 mmol/L (ref 22–32)
Calcium: 8.4 mg/dL — ABNORMAL LOW (ref 8.9–10.3)
Chloride: 100 mmol/L (ref 98–111)
Creatinine, Ser: 0.8 mg/dL (ref 0.61–1.24)
GFR, Estimated: >60 mL/min (ref >60)
Glucose, Bld: 103 mg/dL — ABNORMAL HIGH (ref 70–99)
Potassium: 4.7 mmol/L (ref 3.5–5.1)
Sodium: 132 mmol/L — ABNORMAL LOW (ref 135–145)
Total Bilirubin: 1.5 mg/dL — ABNORMAL HIGH (ref 0.0–1.2)
Total Protein: 6.4 g/dL — ABNORMAL LOW (ref 6.5–8.1)

## 2023-06-29 LAB — CBC
HCT: 33.1 % — ABNORMAL LOW (ref 39.0–52.0)
Hemoglobin: 10.9 g/dL — ABNORMAL LOW (ref 13.0–17.0)
MCH: 28.8 pg (ref 26.0–34.0)
MCHC: 32.9 g/dL (ref 30.0–36.0)
MCV: 87.3 fL (ref 80.0–100.0)
Platelets: 267 10*3/uL (ref 150–400)
RBC: 3.79 MIL/uL — ABNORMAL LOW (ref 4.22–5.81)
RDW: 16.6 % — ABNORMAL HIGH (ref 11.5–15.5)
WBC: 6.2 10*3/uL (ref 4.0–10.5)
nRBC: 0 % (ref 0.0–0.2)

## 2023-06-29 LAB — BASIC METABOLIC PANEL WITH GFR
Anion gap: 6 (ref 5–15)
BUN: 13 mg/dL (ref 6–20)
CO2: 25 mmol/L (ref 22–32)
Calcium: 8.3 mg/dL — ABNORMAL LOW (ref 8.9–10.3)
Chloride: 100 mmol/L (ref 98–111)
Creatinine, Ser: 0.8 mg/dL (ref 0.61–1.24)
GFR, Estimated: >60 mL/min (ref >60)
Glucose, Bld: 109 mg/dL — ABNORMAL HIGH (ref 70–99)
Potassium: 4.6 mmol/L (ref 3.5–5.1)
Sodium: 131 mmol/L — ABNORMAL LOW (ref 135–145)

## 2023-06-29 LAB — VANCOMYCIN, TROUGH: Vancomycin Tr: 16 ug/mL (ref 15–20)

## 2023-06-29 LAB — AEROBIC CULTURE W GRAM STAIN (SUPERFICIAL SPECIMEN): Gram Stain: NONE SEEN

## 2023-06-29 LAB — SEDIMENTATION RATE: Sed Rate: 55 mm/h — ABNORMAL HIGH (ref 0–16)

## 2023-06-29 LAB — C-REACTIVE PROTEIN: CRP: 8.7 mg/dL — ABNORMAL HIGH (ref <1.0)

## 2023-06-29 LAB — MAGNESIUM: Magnesium: 1.8 mg/dL (ref 1.7–2.4)

## 2023-06-29 LAB — PHOSPHORUS: Phosphorus: 2.8 mg/dL (ref 2.5–4.6)

## 2023-06-29 SURGERY — IRRIGATION AND DEBRIDEMENT KNEE
Anesthesia: General | Site: Knee | Laterality: Right

## 2023-06-29 MED ORDER — ONDANSETRON HCL 4 MG PO TABS: 4.0000 mg | ORAL_TABLET | Freq: Four times a day (QID) | ORAL | Status: DC | PRN

## 2023-06-29 MED ORDER — PHENYLEPHRINE 80 MCG/ML (10ML) SYRINGE FOR IV PUSH (FOR BLOOD PRESSURE SUPPORT)
PREFILLED_SYRINGE | INTRAVENOUS | Status: DC | PRN
Start: 2023-06-29 — End: 2023-06-29
  Administered 2023-06-29: 160 ug via INTRAVENOUS
  Administered 2023-06-29 (×2): 80 ug via INTRAVENOUS

## 2023-06-29 MED ORDER — TOBRAMYCIN SULFATE 1.2 G IJ SOLR
INTRAMUSCULAR | Status: DC | PRN
  Administered 2023-06-29: 1.2 g

## 2023-06-29 MED ORDER — PHENYLEPHRINE 80 MCG/ML (10ML) SYRINGE FOR IV PUSH (FOR BLOOD PRESSURE SUPPORT)
PREFILLED_SYRINGE | INTRAVENOUS | Status: AC
  Filled 2023-06-29: qty 10

## 2023-06-29 MED ORDER — LIDOCAINE 2% (20 MG/ML) 5 ML SYRINGE
INTRAMUSCULAR | Status: AC
  Filled 2023-06-29: qty 5

## 2023-06-29 MED ORDER — CEFAZOLIN SODIUM-DEXTROSE 2-4 GM/100ML-% IV SOLN
INTRAVENOUS | Status: AC
  Filled 2023-06-29: qty 100

## 2023-06-29 MED ORDER — LIDOCAINE 2% (20 MG/ML) 5 ML SYRINGE
INTRAMUSCULAR | Status: DC | PRN
Start: 2023-06-29 — End: 2023-06-29
  Administered 2023-06-29: 60 mg via INTRAVENOUS

## 2023-06-29 MED ORDER — CEFAZOLIN SODIUM-DEXTROSE 2-4 GM/100ML-% IV SOLN
2.0000 g | INTRAVENOUS | Status: AC
  Administered 2023-06-29: 2 g via INTRAVENOUS

## 2023-06-29 MED ORDER — DEXAMETHASONE SODIUM PHOSPHATE 10 MG/ML IJ SOLN
INTRAMUSCULAR | Status: DC | PRN
  Administered 2023-06-29: 10 mg via INTRAVENOUS

## 2023-06-29 MED ORDER — VANCOMYCIN HCL IN DEXTROSE 1-5 GM/200ML-% IV SOLN
1000.0000 mg | Freq: Two times a day (BID) | INTRAVENOUS | Status: DC
  Administered 2023-06-29 – 2023-07-04 (×9): 1000 mg via INTRAVENOUS
  Filled 2023-06-29 (×9): qty 200

## 2023-06-29 MED ORDER — VANCOMYCIN HCL 500 MG IV SOLR
INTRAVENOUS | Status: AC
  Filled 2023-06-29: qty 10

## 2023-06-29 MED ORDER — FENTANYL CITRATE (PF) 250 MCG/5ML IJ SOLN
INTRAMUSCULAR | Status: AC
  Filled 2023-06-29: qty 5

## 2023-06-29 MED ORDER — DEXAMETHASONE SODIUM PHOSPHATE 10 MG/ML IJ SOLN
INTRAMUSCULAR | Status: AC
  Filled 2023-06-29: qty 1

## 2023-06-29 MED ORDER — METOCLOPRAMIDE HCL 5 MG PO TABS: 5.0000 mg | ORAL_TABLET | Freq: Three times a day (TID) | ORAL | Status: DC | PRN

## 2023-06-29 MED ORDER — CHLORHEXIDINE GLUCONATE 0.12 % MT SOLN: 15.0000 mL | Freq: Once | OROMUCOSAL | Status: AC

## 2023-06-29 MED ORDER — PROPOFOL 10 MG/ML IV BOLUS
INTRAVENOUS | Status: AC
  Filled 2023-06-29: qty 20

## 2023-06-29 MED ORDER — DIPHENHYDRAMINE HCL 12.5 MG/5ML PO ELIX: 12.5000 mg | ORAL_SOLUTION | ORAL | Status: DC | PRN

## 2023-06-29 MED ORDER — ONDANSETRON HCL 4 MG/2ML IJ SOLN
INTRAMUSCULAR | Status: AC
  Filled 2023-06-29: qty 2

## 2023-06-29 MED ORDER — POLYETHYLENE GLYCOL 3350 17 G PO PACK
17.0000 g | PACK | Freq: Every day | ORAL | Status: DC | PRN
  Administered 2023-07-02: 17 g via ORAL
  Filled 2023-06-29 (×3): qty 1

## 2023-06-29 MED ORDER — VANCOMYCIN HCL 1000 MG IV SOLR
INTRAVENOUS | Status: AC
  Filled 2023-06-29: qty 20

## 2023-06-29 MED ORDER — GENTAMICIN SULFATE 40 MG/ML IJ SOLN
INTRAMUSCULAR | Status: AC
  Filled 2023-06-29: qty 2

## 2023-06-29 MED ORDER — FENTANYL CITRATE (PF) 250 MCG/5ML IJ SOLN
INTRAMUSCULAR | Status: DC | PRN
  Administered 2023-06-29: 50 ug via INTRAVENOUS

## 2023-06-29 MED ORDER — ORAL CARE MOUTH RINSE: 15.0000 mL | Freq: Once | OROMUCOSAL | Status: AC

## 2023-06-29 MED ORDER — LACTATED RINGERS IV SOLN: INTRAVENOUS | Status: DC

## 2023-06-29 MED ORDER — METOCLOPRAMIDE HCL 5 MG/ML IJ SOLN: 5.0000 mg | Freq: Three times a day (TID) | INTRAMUSCULAR | Status: DC | PRN

## 2023-06-29 MED ORDER — VANCOMYCIN HCL 1000 MG IV SOLR
INTRAVENOUS | Status: DC | PRN
  Administered 2023-06-29: 1000 mg

## 2023-06-29 MED ORDER — TOBRAMYCIN SULFATE 1.2 G IJ SOLR
INTRAMUSCULAR | Status: AC
  Filled 2023-06-29: qty 1.2

## 2023-06-29 MED ORDER — SUGAMMADEX SODIUM 200 MG/2ML IV SOLN
INTRAVENOUS | Status: DC | PRN
Start: 2023-06-29 — End: 2023-06-29
  Administered 2023-06-29: 200 mg via INTRAVENOUS

## 2023-06-29 MED ORDER — ROCURONIUM BROMIDE 10 MG/ML (PF) SYRINGE
PREFILLED_SYRINGE | INTRAVENOUS | Status: DC | PRN
  Administered 2023-06-29: 10 mg via INTRAVENOUS
  Administered 2023-06-29: 50 mg via INTRAVENOUS

## 2023-06-29 MED ORDER — CHLORHEXIDINE GLUCONATE 4 % EX SOLN
1.0000 | CUTANEOUS | 1 refills | Status: DC
  Filled 2023-06-29: qty 946, 30d supply, fill #0

## 2023-06-29 MED ORDER — ROCURONIUM BROMIDE 10 MG/ML (PF) SYRINGE
PREFILLED_SYRINGE | INTRAVENOUS | Status: AC
  Filled 2023-06-29: qty 10

## 2023-06-29 MED ORDER — CHLORHEXIDINE GLUCONATE 0.12 % MT SOLN
OROMUCOSAL | Status: AC
  Administered 2023-06-29: 15 mL via OROMUCOSAL
  Filled 2023-06-29: qty 15

## 2023-06-29 MED ORDER — ONDANSETRON HCL 4 MG/2ML IJ SOLN
4.0000 mg | Freq: Four times a day (QID) | INTRAMUSCULAR | Status: DC | PRN
  Filled 2023-06-29: qty 2

## 2023-06-29 MED ORDER — MIDAZOLAM HCL 2 MG/2ML IJ SOLN
INTRAMUSCULAR | Status: AC
  Filled 2023-06-29: qty 2

## 2023-06-29 MED ORDER — PROPOFOL 10 MG/ML IV BOLUS
INTRAVENOUS | Status: DC | PRN
  Administered 2023-06-29: 100 mg via INTRAVENOUS

## 2023-06-29 MED ORDER — KETOROLAC TROMETHAMINE 15 MG/ML IJ SOLN
15.0000 mg | Freq: Four times a day (QID) | INTRAMUSCULAR | Status: AC
  Administered 2023-06-29 – 2023-06-30 (×4): 15 mg via INTRAVENOUS
  Filled 2023-06-29 (×4): qty 1

## 2023-06-29 MED ORDER — 0.9 % SODIUM CHLORIDE (POUR BTL) OPTIME
TOPICAL | Status: DC | PRN
  Administered 2023-06-29: 1000 mL

## 2023-06-29 MED ORDER — SODIUM CHLORIDE 0.9 % IR SOLN
Status: DC | PRN
  Administered 2023-06-29: 3000 mL

## 2023-06-29 MED ORDER — DOCUSATE SODIUM 100 MG PO CAPS
100.0000 mg | ORAL_CAPSULE | Freq: Two times a day (BID) | ORAL | Status: DC
  Administered 2023-06-29 – 2023-07-07 (×14): 100 mg via ORAL
  Filled 2023-06-29 (×16): qty 1

## 2023-06-29 MED ORDER — ONDANSETRON HCL 4 MG/2ML IJ SOLN
INTRAMUSCULAR | Status: DC | PRN
  Administered 2023-06-29: 4 mg via INTRAVENOUS

## 2023-06-29 MED ORDER — MUPIROCIN 2 % EX OINT
1.0000 | TOPICAL_OINTMENT | Freq: Two times a day (BID) | CUTANEOUS | 0 refills | Status: AC
Start: 2023-06-29 — End: 2023-07-29
  Filled 2023-06-29: qty 44, 22d supply, fill #0

## 2023-06-29 MED ORDER — POVIDONE-IODINE 10 % EX SWAB
2.0000 | Freq: Once | CUTANEOUS | Status: AC
  Administered 2023-06-29: 2 via TOPICAL

## 2023-06-29 MED ORDER — GENTAMICIN SULFATE 40 MG/ML IJ SOLN
INTRAMUSCULAR | Status: DC | PRN
  Administered 2023-06-29: 80 mg

## 2023-06-29 MED ORDER — VANCOMYCIN HCL 1000 MG IV SOLR
INTRAVENOUS | Status: AC
Start: 2023-06-29 — End: ?
  Filled 2023-06-29: qty 20

## 2023-06-29 SURGICAL SUPPLY — 45 items
BAG COUNTER SPONGE SURGICOUNT (BAG) ×1 IMPLANT
BNDG COHESIVE 4X5 TAN STRL LF (GAUZE/BANDAGES/DRESSINGS) ×1 IMPLANT
BNDG ELASTIC 4INX 5YD STR LF (GAUZE/BANDAGES/DRESSINGS) IMPLANT
BNDG ELASTIC 6INX 5YD STR LF (GAUZE/BANDAGES/DRESSINGS) IMPLANT
BNDG GAUZE DERMACEA FLUFF 4 (GAUZE/BANDAGES/DRESSINGS) ×2 IMPLANT
BRUSH SCRUB EZ PLAIN DRY (MISCELLANEOUS) ×2 IMPLANT
CHLORAPREP W/TINT 26 (MISCELLANEOUS) ×1 IMPLANT
COVER MAYO STAND STRL (DRAPES) ×1 IMPLANT
COVER SURGICAL LIGHT HANDLE (MISCELLANEOUS) ×2 IMPLANT
DRAPE SURG 17X23 STRL (DRAPES) ×1 IMPLANT
DRAPE SURG ORHT 6 SPLT 77X108 (DRAPES) ×1 IMPLANT
DRAPE U-SHAPE 47X51 STRL (DRAPES) ×1 IMPLANT
DRSG ADAPTIC 3X8 NADH LF (GAUZE/BANDAGES/DRESSINGS) ×1 IMPLANT
DRSG MEPITEL 4X7.2 (GAUZE/BANDAGES/DRESSINGS) IMPLANT
ELECTRODE REM PT RTRN 9FT ADLT (ELECTROSURGICAL) IMPLANT
EVACUATOR 1/8 PVC DRAIN (DRAIN) IMPLANT
GAUZE PAD ABD 8X10 STRL (GAUZE/BANDAGES/DRESSINGS) IMPLANT
GAUZE SPONGE 4X4 12PLY STRL (GAUZE/BANDAGES/DRESSINGS) ×1 IMPLANT
GLOVE BIO SURGEON STRL SZ 6.5 (GLOVE) ×3 IMPLANT
GLOVE BIO SURGEON STRL SZ7.5 (GLOVE) ×4 IMPLANT
GLOVE BIOGEL PI IND STRL 6.5 (GLOVE) ×1 IMPLANT
GLOVE BIOGEL PI IND STRL 7.5 (GLOVE) ×1 IMPLANT
GOWN STRL REUS W/ TWL LRG LVL3 (GOWN DISPOSABLE) ×2 IMPLANT
KIT BASIN OR (CUSTOM PROCEDURE TRAY) ×1 IMPLANT
KIT STIMULAN RAPID CURE 10CC (Orthopedic Implant) IMPLANT
KIT TURNOVER KIT B (KITS) ×1 IMPLANT
MANIFOLD NEPTUNE II (INSTRUMENTS) ×1 IMPLANT
NS IRRIG 1000ML POUR BTL (IV SOLUTION) ×1 IMPLANT
PACK ORTHO EXTREMITY (CUSTOM PROCEDURE TRAY) ×1 IMPLANT
PAD ARMBOARD POSITIONER FOAM (MISCELLANEOUS) ×2 IMPLANT
PAD CAST CTTN 4X4 STRL (SOFTGOODS) IMPLANT
PADDING CAST COTTON 6X4 STRL (CAST SUPPLIES) ×1 IMPLANT
SET HNDPC FAN SPRY TIP SCT (DISPOSABLE) IMPLANT
SPONGE T-LAP 18X18 ~~LOC~~+RFID (SPONGE) ×1 IMPLANT
SUT ETHILON 2 0 FS 18 (SUTURE) ×2 IMPLANT
SUT ETHILON 3 0 PS 1 (SUTURE) ×2 IMPLANT
SUT MON AB 2-0 CT1 36 (SUTURE) ×1 IMPLANT
SUT PDS AB 0 CT 36 (SUTURE) IMPLANT
SWAB CULTURE ESWAB REG 1ML (MISCELLANEOUS) IMPLANT
TOWEL GREEN STERILE (TOWEL DISPOSABLE) ×2 IMPLANT
TOWEL GREEN STERILE FF (TOWEL DISPOSABLE) ×1 IMPLANT
TUBE CONNECTING 12X1/4 (SUCTIONS) ×1 IMPLANT
UNDERPAD 30X36 HEAVY ABSORB (UNDERPADS AND DIAPERS) ×1 IMPLANT
WATER STERILE IRR 1000ML POUR (IV SOLUTION) ×1 IMPLANT
YANKAUER SUCT BULB TIP NO VENT (SUCTIONS) ×1 IMPLANT

## 2023-06-29 NOTE — Progress Notes (Signed)
 Pharmacy Antibiotic Note  Jonathan Ibarra is a 58 y.o. male admitted on 06/26/2023 with recurrent right septic knee.  Pharmacy has been consulted for vancomycin  dosing.  Vanc peak 32 yesterday but trough was not done. Trough collected tonight and came back as 16. Calculated AUC = 647. We will reduce dose to get AUC in range.   Plan: Decrease vancomycin  1g IV q12 AUC 431 Follow up renal function, cultures as available, clinical progress, length of tx  Height: 6\' 1"  (185.4 cm) Weight: 115.7 kg (255 lb) IBW/kg (Calculated) : 79.9  Temp (24hrs), Avg:97.8 F (36.6 C), Min:97.5 F (36.4 C), Max:98.1 F (36.7 C)  Recent Labs  Lab 06/27/23 0219 06/27/23 0230 06/27/23 0439 06/28/23 2128 06/29/23 0651 06/29/23 0655 06/29/23 1805  WBC 6.9  --   --   --   --  6.2  --   CREATININE 0.71  --   --   --  0.80 0.80  --   LATICACIDVEN  --  1.8 1.0  --   --   --   --   VANCOTROUGH  --   --   --   --   --   --  16  VANCOPEAK  --   --   --  32  --   --   --     Estimated Creatinine Clearance: 137.4 mL/min (by C-G formula based on SCr of 0.8 mg/dL).    Allergies  Allergen Reactions   Tylenol  [Acetaminophen ] Other (See Comments)    Impacts liver    Antimicrobials this admission: 4/28 Vancomycin  >> 4/28 Ceftriaxone  x 1  Dose adjustments this admission:  Microbiology results: 4/28 BCx: ngtd 4/28 R Leg: MRSA  Ivery Marking, PharmD, BCIDP, AAHIVP, CPP Infectious Disease Pharmacist 06/29/2023 8:14 PM

## 2023-06-29 NOTE — Op Note (Signed)
 Orthopaedic Surgery Operative Note (CSN: [id number] ) Date of Surgery: 06/29/2023  Admit Date: 06/26/2023   Diagnoses: Pre-Op Diagnoses: Right tibial osteomyelitis with intraosseous abscess  Post-Op Diagnosis: Same  Procedures: CPT 27360-Excision of intraosseous abscess right proximal tibia CPT 27603-Incision and drainage of right leg abscess CPT 11981-Placement of antibiotic beads intraosseously  Surgeons : Primary: Laneta Pintos, MD  Assistant: Alona Jamaica, PA-C  Location: OR 3   Anesthesia: General   Antibiotics: Ancef  2g preop with 1 gm vancomycin  powder placed in Stimulan bead   Tourniquet time: None    Estimated Blood Loss: 100 mL  Complications:* No complications entered in OR log *   Specimens: ID Type Source Tests Collected by Time Destination  A : soft tissue abscess Abscess Leg, Right AEROBIC/ANAEROBIC CULTURE W GRAM STAIN (SURGICAL/DEEP WOUND) Laneta Pintos, MD 06/29/2023 1039   B : soft tissue abscess Abscess Leg, Right AEROBIC/ANAEROBIC CULTURE W GRAM STAIN (SURGICAL/DEEP WOUND) Laneta Pintos, MD 06/29/2023 1041   C : bone abscess Abscess Leg, Right AEROBIC/ANAEROBIC CULTURE W GRAM STAIN (SURGICAL/DEEP WOUND) Laneta Pintos, MD 06/29/2023 1041      Implants: Implant Name Type Inv. Item Serial No. Manufacturer Lot No. LRB No. Used Action  KIT STIMULAN RAPID CURE 10CC - [id number] Orthopedic Implant KIT STIMULAN RAPID CURE 10CC  [employer] [id number] Right 1 Implanted     Indications for Surgery: 58 year old male who had a tibial plateau fracture undergoing open reduction internal fixation in October.  He subsequently developed a significant postoperative infection and underwent irrigation debridement approximately 2 weeks after.  He has had recurrent infections and noncompliance issues and eventually he was discharged after prolonged course of IV antibiotics in the hospital.  He never followed up with infectious disease and he never followed up with  myself.  He presents to the emergency room with drainage from his wound.  Due to the continued infection I recommend proceeding with irrigation and debridement with antibiotic cement/bone substitute placement.  Risks and benefits were discussed with the patient.  He agreed to proceed with surgery and consent was obtained.  Operative Findings: 1.  I&D of right leg abscess with extension into an intraosseous abscess with osteomyelitis. 2.  Placement of Stimulan calcium  sulfate beads with vancomycin  powder  Procedure: The patient was identified in the preoperative holding area. Consent was confirmed with the patient and their family and all questions were answered. The operative extremity was marked after confirmation with the patient. he was then brought back to the operating room by our anesthesia colleagues.  He was placed under general anesthetic and carefully transferred over to radiolucent flattop table.  A bump was placed under his operative hip.  The right lower extremity was then prepped and draped in usual sterile fashion.  A timeout was performed to verify the patient, the procedure, and the extremity.  Preoperative antibiotics were dosed.  I was able to express a significant amount of fluid subcutaneously and this was sent for culture #1.  I then opened the previous wound and was able to enter the subcutaneous abscess and debrided this which was cultured #2.  I then created a window using a curette in the lateral cortex of the tibia to access the intraosseous abscess.  The fluid in material intraosseously was sent as culture #3.  I then used a curette within the metaphyseal bone to debride the extent of the abscess.  I removed some of the bone and sent for culture and #3.  Once I had  all the areas within the metaphysis debrided I then proceeded to use cystoscopy tubing to irrigate the intraosseous abscess with 3 L of normal saline.  Once I had this cleaned out I then used Stimulan beads with  vancomycin  powder and liquid Tobra and mix this together and placed the beads intraosseously.  Fluoroscopic imaging was obtained to show the area of debridement as well as the location of the calcium  sulfate beads.  The wound was then irrigated and closed with 2-0 nylon suture.  Sterile dressing was applied.  The patient was then awoke from anesthesia and taken to the PACU in stable condition.   Debridement type: Excisional Debridement  Side: right  Body Location: Leg/knee  Tools used for debridement: curette and rongeur  Pre-debridement Wound size (cm):   N/A  Post-debridement Wound size (cm):   N/A   Debridement depth beyond dead/damaged tissue down to healthy viable tissue: yes  Tissue layer involved: skin, subcutaneous tissue, muscle / fascia, bone  Nature of tissue removed: Purulence  Irrigation volume: 3L     Irrigation fluid type: Normal Saline   Post Op Plan/Instructions: Patient will be weightbearing as tolerated to the right lower extremity.  He will receive antibiotics postoperatively and will need infectious disease consult.  We will place him on aspirin for DVT prophylaxis.  I was present and performed the entire surgery.  Alona Jamaica, PA-C did assist me throughout the case. An assistant was necessary given the difficulty in approach, maintenance of reduction and ability to instrument the fracture.   Katheryne Pane, MD [facility]

## 2023-06-29 NOTE — Progress Notes (Signed)
 Received report from [facility] in PACU for patient returning to the unit post procedure.

## 2023-06-29 NOTE — Anesthesia Procedure Notes (Signed)
 Procedure Name: Intubation Date/Time: 06/29/2023 10:08 AM  Performed by: Artemisa Bile D, CRNAPre-anesthesia Checklist: Patient identified, Emergency Drugs available, Suction available and Patient being monitored Patient Re-evaluated:Patient Re-evaluated prior to induction Oxygen Delivery Method: Circle System Utilized Preoxygenation: Pre-oxygenation with 100% oxygen Induction Type: IV induction Ventilation: Mask ventilation without difficulty Laryngoscope Size: Miller and 3 Grade View: Grade I Tube type: Oral Tube size: 7.5 mm Number of attempts: 1 Airway Equipment and Method: Stylet and Oral airway Placement Confirmation: ETT inserted through vocal cords under direct vision, positive ETCO2 and breath sounds checked- equal and bilateral Secured at: 22 cm Tube secured with: Tape Dental Injury: Teeth and Oropharynx as per pre-operative assessment

## 2023-06-29 NOTE — Anesthesia Postprocedure Evaluation (Signed)
 Anesthesia Post Note  Patient: Jonathan Ibarra  Procedure(s) Performed: IRRIGATION AND DEBRIDEMENT KNEE (Right: Knee)     Patient location during evaluation: PACU Anesthesia Type: General Level of consciousness: awake and alert Pain management: pain level controlled Vital Signs Assessment: post-procedure vital signs reviewed and stable Respiratory status: spontaneous breathing, nonlabored ventilation and respiratory function stable Cardiovascular status: blood pressure returned to baseline and stable Postop Assessment: no apparent nausea or vomiting Anesthetic complications: no  No notable events documented.  Last Vitals:  Vitals:   06/29/23 1143 06/29/23 1205  BP: 105/72 109/68  Pulse: (!) 57 94  Resp: 10 14  Temp: 36.4 C 36.4 C  SpO2: 95% 93%    Last Pain:  Vitals:   06/29/23 1205  TempSrc: Axillary  PainSc:                  Sherisa Gilvin,W. EDMOND

## 2023-06-29 NOTE — TOC Progression Note (Signed)
 Transition of Care [facility]) - Progression Note    Patient Details  Name: Jonathan Ibarra MRN: [id number] Date of Birth: 02-11-1966  Transition of Care [facility]) CM/SW Contact  Kimarie Coor [place], [place] Phone Number: 06/29/2023, 2:01 PM  Clinical Narrative:     CSW met with pt and provided SNF bed offers and medicare star ratings. CSW explained insurance auth process. Pt is unable to make decision at this time. Requests CSW return later for SNF choice.   Expected Discharge Plan: Skilled Nursing Facility Barriers to Discharge: Insurance Authorization  Expected Discharge Plan and Services   Discharge Planning Services: CM Consult Post Acute Care Choice:  (await PT eval post op) Living arrangements for the past 2 months: Single Family Home                 DME Arranged:  (await post op eval)         HH Arranged:  (see note)           Social Determinants of Health (SDOH) Interventions SDOH Screenings   Food Insecurity: Food Insecurity Present (06/27/2023)  Housing: High Risk (06/27/2023)  Transportation Needs: Unmet Transportation Needs (06/27/2023)  Utilities: Not At Risk (06/27/2023)  Financial Resource Strain: Medium Risk (01/07/2020)   Received from [facility], [facility]  Social Connections: Unknown (07/05/2021)   Received from [facility], [facility]  Stress: No Stress Concern Present (01/07/2020)   Received from [facility], [facility]  Tobacco Use: High Risk (06/29/2023)    Readmission Risk Interventions     No data to display

## 2023-06-29 NOTE — Transfer of Care (Signed)
 Immediate Anesthesia Transfer of Care Note  Patient: Jonathan Ibarra  Procedure(s) Performed: IRRIGATION AND DEBRIDEMENT KNEE (Right: Knee)  Patient Location: PACU  Anesthesia Type:General  Level of Consciousness: drowsy  Airway & Oxygen Therapy: Patient Spontanous Breathing and Patient connected to nasal cannula oxygen  Post-op Assessment: Report given to RN and Post -op Vital signs reviewed and stable  Post vital signs: Reviewed and stable  Last Vitals:  Vitals Value Taken Time  BP 111/63 06/29/23 1112  Temp    Pulse 65 06/29/23 1114  Resp 13 06/29/23 1114  SpO2 100 % 06/29/23 1114  Vitals shown include unfiled device data.  Last Pain:  Vitals:   06/29/23 0840  TempSrc:   PainSc: 0-No pain      Patients Stated Pain Goal: 0 (06/28/23 2127)  Complications: No notable events documented.

## 2023-06-29 NOTE — Anesthesia Preprocedure Evaluation (Addendum)
 Anesthesia Evaluation  Patient identified by MRN, date of birth, ID band Patient awake    Reviewed: Allergy & Precautions, H&P , NPO status , Patient's Chart, lab work & pertinent test results, reviewed documented beta blocker date and time   Airway Mallampati: II  TM Distance: >3 FB Neck ROM: Full    Dental no notable dental hx. (+) Poor Dentition, Dental Advisory Given   Pulmonary Current Smoker and Patient abstained from smoking.   Pulmonary exam normal breath sounds clear to auscultation       Cardiovascular hypertension, Pt. on medications and Pt. on home beta blockers + dysrhythmias Atrial Fibrillation and Supra Ventricular Tachycardia  Rhythm:Regular Rate:Normal     Neuro/Psych negative neurological ROS  negative psych ROS   GI/Hepatic negative GI ROS,,,(+)     substance abuse  alcohol use  Endo/Other  negative endocrine ROS    Renal/GU negative Renal ROS  negative genitourinary   Musculoskeletal  (+) Arthritis , Osteoarthritis,    Abdominal   Peds  Hematology negative hematology ROS (+)   Anesthesia Other Findings   Reproductive/Obstetrics negative OB ROS                             Anesthesia Physical Anesthesia Plan  ASA: 3  Anesthesia Plan: General   Post-op Pain Management: Tylenol  PO (pre-op)*   Induction: Intravenous  PONV Risk Score and Plan: 2 and Ondansetron , Dexamethasone  and Midazolam   Airway Management Planned: Oral ETT  Additional Equipment:   Intra-op Plan:   Post-operative Plan: Extubation in OR  Informed Consent: I have reviewed the patients History and Physical, chart, labs and discussed the procedure including the risks, benefits and alternatives for the proposed anesthesia with the patient or authorized representative who has indicated his/her understanding and acceptance.     Dental advisory given  Plan Discussed with: CRNA  Anesthesia  Plan Comments:        Anesthesia Quick Evaluation

## 2023-06-29 NOTE — Plan of Care (Signed)
   Problem: Coping: Goal: Level of anxiety will decrease Outcome: Progressing   Problem: Pain Managment: Goal: General experience of comfort will improve and/or be controlled Outcome: Progressing   Problem: Safety: Goal: Ability to remain free from injury will improve Outcome: Progressing

## 2023-06-29 NOTE — Interval H&P Note (Signed)
 History and Physical Interval Note:  06/29/2023 9:07 AM  Jonathan Ibarra  has presented today for surgery, with the diagnosis of Right knee infection.  The various methods of treatment have been discussed with the patient and family. After consideration of risks, benefits and other options for treatment, the patient has consented to  Procedure(s): IRRIGATION AND DEBRIDEMENT KNEE (Right) as a surgical intervention.  The patient's history has been reviewed, patient examined, no change in status, stable for surgery.  I have reviewed the patient's chart and labs.  Questions were answered to the patient's satisfaction.     Samantha Ragen P Sandrea Boer

## 2023-06-29 NOTE — Progress Notes (Signed)
 Patient was arguing with the writer for not giving IV pain medicine Inj.Diladid, when the Oxycodone  PRN was given. Patient was checked several times throughout the night and was sound asleep, and never woke up even when the staff were in the room performing the task. Patient was irritable when asked to brush the teeth in the morning for surgery

## 2023-06-29 NOTE — Progress Notes (Signed)
 PROGRESS NOTE    Jonathan Ibarra  ZOX:[id number] DOB: 10/12/1965 DOA: 06/26/2023 PCP: Sharry Deem, MD   Brief Narrative:  Jonathan Ibarra is a 58 y.o. male with past medical history significant for tibial plateau fracture, right knee septic arthritis with hardware removal, I&D, wound VAC placement February 2025, HTN, SVT, paroxysmal atrial fibrillation not on anticoagulation, cirrhosis s/p TIPS, chronic thrombocytopenia, anemia of chronic medical disease, polysubstance abuse who presented to [facility] ED on 06/26/2023 with right knee pain, swelling, drainage.  Reports in his usual state of health until this past week when he noticed increased pain, swelling, erythema and puslike drainage from his right knee.  Admitting to "not taking care of his knee".  Did not report to the infusion clinic for antibiotic administration March 2025 as planned and has not followed up with ID.  Imaging was done and Right knee x-ray with sequelae of medial/lateral tibial plateau fracture status post hardware removal, probable joint effusion with some evidence of bony reabsorption in the lateral tibial plateau related to prior trauma versus osteomyelitis.  MR right knee without contrast with enlarging complex knee joint effusion with diffuse periarticular marrow edema suspicious for septic arthritis and osteomyelitis, probable associated intraosseous abscess proximal tibia with fistulous tract communicating with a lateral soft tissue component, extensive periarticular soft tissue edema surrounding the knee.  Blood cultures x 2 obtained.  Patient was started on a Cardizem  drip for A-fib with RVR.  Orthopedics consulted.  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  Noncompliant with outpatient therapy including IV antibiotics and follow-up with infectious disease.  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  Patient is afebrile without leukocytosis.  MR right knee with findings consistent with acute septic arthritis with concern for osteomyelitis and abscess formation.  Seen by orthopedics, Dr. Curtiss Dowdy who initially recommended AKA, but patient declined with planned I&D on 4/30. CRP was 8.7. Orthopedics ([facility]) following but unclear if ID is despite being documented so will discuss with them; appreciate assistance. Blood cultures x 2: No growth x 2 days;  Superficial leg culture: + MRSA; C/w  IV Vancomycin , pharmacy consulted for dosing/monitoring. Pain control w/ Oxycodone  15 mg p.o. every 4 hours as needed severe pain, Robaxin  750 mg p.o. every 8 hours as needed muscle spasms, and Hydromorphone  1 mg IV every 4 hours as needed severe pain not relieved with oral medication. S/p Excision of intraosseous abscess R Proximal Tibia and I&D of R Leg Abscess and Placement of Abx Beads Intraosseously.    Paroxysmal atrial fibrillation with RVR  Essential HTN: Patient presenting with elevated heart rate of 153.  Initially placed on Cardizem  drip.  Home medications restarted and was able to be titrated off.  Not on anticoagulation outpatient. C/w Diltiazem  180 mg p.o. daily and Metoprolol  Succinate 50 mg p.o. daily. HR remains well-controlled now, discontinue telemetry   Cirrhosis s/p TIPS: C/w 2 gram Low-salt diet. Will need  Outpatient follow-up with GI. Bilirubin was elevated at 1.5 on last check but LFTs normal. CTM and Trend and watch for decompensation. Check PT-INR and CMP in the AM    Anemia of chronic medical disease: Hgb/Hct went from 13.0/40.9 -> 10.9/33.1. Check Anemia Panel  in the AM. CTM for S/Sx of Bleeding; No overt bleeding noted. Repeat CBC in the  AM   Polysubstance use: UDS positive for amphetamines, opiates, THC.  Counseled on need for complete abstinence/cessation from illicit substances.  Hypoalbuminemia: Patient's Albumin went from 2.4 -> 1.9. CTM and Trend and repeat CMP in the AM  Class I Obesity: Complicates overall prognosis and care. Estimated body mass index is 33.64 kg/m as calculated from the following:   Height as of this encounter: 6\' 1"  (1.854 m).   Weight as of this encounter: 115.7 kg. Weight Loss and Dietary Counseling given   DVT prophylaxis: SCDs Start: 06/29/23 1203 enoxaparin  (LOVENOX ) injection 40 mg Start: 06/27/23 1200    Code Status: Full Code Family Communication: No family present at bedside as patient was seen in the PACU  Disposition Plan:  Level of care: Med-Surg Status is: Inpatient Remains inpatient appropriate because: Needs further clinical improvement and clearance by the specialists   Consultants:  Orthopedic Surgery Dr. Curtiss Dowdy  Procedures:  Procedures: CPT 27360-Excision of intraosseous abscess right proximal tibia CPT 27603-Incision and drainage of right leg abscess CPT 11981-Placement of antibiotic beads intraosseously   Antimicrobials:  Anti-infectives (From admission, onward)    Start     Dose/Rate Route Frequency Ordered Stop   06/29/23 1038  gentamicin (GARAMYCIN) injection  Status:  Discontinued          As needed 06/29/23 1038 06/29/23 1108   06/29/23 1029  tobramycin  (NEBCIN ) powder  Status:  Discontinued          As needed 06/29/23 1029 06/29/23 1108   06/29/23 1029  vancomycin  (VANCOCIN ) powder  Status:  Discontinued          As needed 06/29/23 1030 06/29/23 1108   06/29/23 0845  ceFAZolin  (ANCEF ) IVPB 2g/100 mL premix        2 g 200 mL/hr over 30 Minutes Intravenous On call to O.R. 06/29/23 0837 06/29/23 1018   06/29/23 0805  ceFAZolin  (ANCEF ) 2-4 GM/100ML-% IVPB       Note to Pharmacy: Emeterio Hansen, GRETA: cabinet override      06/29/23 0805 06/29/23 1019   06/27/23  1800  vancomycin  (VANCOREADY) IVPB 1500 mg/300 mL        1,500 mg 150 mL/hr over 120 Minutes Intravenous Every 12 hours 06/27/23 1429     06/27/23 0315  cefTRIAXone  (ROCEPHIN ) 2 g in sodium chloride  0.9 % 100 mL IVPB        2 g 200 mL/hr over 30 Minutes Intravenous Once 06/27/23 0313 06/27/23 0512   06/27/23 0315  vancomycin  (VANCOCIN ) IVPB 1000 mg/200 mL premix  Status:  Discontinued        1,000 mg 200 mL/hr over 60 Minutes Intravenous  Once 06/27/23 0313 06/27/23 0314   06/27/23 0315  vancomycin  (VANCOREADY) IVPB 2000 mg/400 mL        2,000 mg 200 mL/hr over 120 Minutes Intravenous  Once 06/27/23 0314 06/27/23 0701       Subjective: Seen and examined at bedside in the PACU he is very somnolent and drowsy as he had just come out of anesthesia.  Not really able to interact given his somnolence and drowsiness.  Per report he was confused a little bit prior to even going to surgery.  No other concerns or points at this time.  Objective: Vitals:   06/29/23 1115 06/29/23 1130 06/29/23 1143 06/29/23 1205  BP: 113/63 105/75 105/72 109/68  Pulse: (!) 57 61 (!) 57 94  Resp: 11  20 10 14   Temp:   97.6 F (36.4 C) 97.6 F (36.4 C)  TempSrc:    Axillary  SpO2: 100% 96% 95% 93%  Weight:      Height:        Intake/Output Summary (Last 24 hours) at 06/29/2023 1715 Last data filed at 06/29/2023 1533 Gross per 24 hour  Intake 1533 ml  Output 100 ml  Net 1433 ml   Filed Weights   06/26/23 2340  Weight: 115.7 kg   Examination: Physical Exam:  Constitutional: WN/WD obese somnolent and drowsy chronically ill-appearing Caucasian male Respiratory: Diminished to auscultation bilaterally, no wheezing, rales, rhonchi or crackles. Normal respiratory effort and patient is not tachypenic. No accessory muscle use.  Unlabored breathing Cardiovascular: RRR, no murmurs / rubs / gallops. S1 and S2 auscultated.  Right leg is wrapped Abdomen: Soft, non-tender, distended secondary to body habitus.  Bowel  sounds positive.  GU: Deferred. Musculoskeletal: No clubbing / cyanosis of digits/nails. No joint deformity upper and lower extremities.  Skin: No rashes, lesions, ulcers on limited skin evaluation. No induration; Warm and dry.  Neurologic: Very somnolent and drowsy Psychiatric: Impaired judgment and insight  Data Reviewed: I have personally reviewed following labs and imaging studies  CBC: Recent Labs  Lab 06/27/23 0219 06/29/23 0655  WBC 6.9 6.2  NEUTROABS 4.8  --   HGB 13.0 10.9*  HCT 40.9 33.1*  MCV 89.5 87.3  PLT 298 267   Basic Metabolic Panel: Recent Labs  Lab 06/27/23 0219 06/29/23 0651 06/29/23 0655  NA 138 132* 131*  K 3.1* 4.7 4.6  CL 104 100 100  CO2 24 26 25   GLUCOSE 56* 103* 109*  BUN 9 13 13   CREATININE 0.71 0.80 0.80  CALCIUM  8.8* 8.4* 8.3*  MG  --   --  1.8  PHOS  --  2.8  --    GFR: Estimated Creatinine Clearance: 137.4 mL/min (by C-G formula based on SCr of 0.8 mg/dL). Liver Function Tests: Recent Labs  Lab 06/27/23 0219 06/29/23 0651  AST 24 16  ALT 12 9  ALKPHOS 100 76  BILITOT 1.2 1.5*  PROT 7.8 6.4*  ALBUMIN 2.4* 1.9*   No results for input(s): "LIPASE", "AMYLASE" in the last 168 hours. No results for input(s): "AMMONIA" in the last 168 hours. Coagulation Profile: Recent Labs  Lab 06/27/23 0219  INR 1.2   Cardiac Enzymes: No results for input(s): "CKTOTAL", "CKMB", "CKMBINDEX", "TROPONINI" in the last 168 hours. BNP (last 3 results) No results for input(s): "PROBNP" in the last 8760 hours. HbA1C: Recent Labs    06/27/23 0219  HGBA1C 4.5*   CBG: Recent Labs  Lab 06/27/23 0607  GLUCAP 82   Lipid Profile: No results for input(s): "CHOL", "HDL", "LDLCALC", "TRIG", "CHOLHDL", "LDLDIRECT" in the last 72 hours. Thyroid Function Tests: No results for input(s): "TSH", "T4TOTAL", "FREET4", "T3FREE", "THYROIDAB" in the last 72 hours. Anemia Panel: No results for input(s): "VITAMINB12", "FOLATE", "FERRITIN", "TIBC", "IRON",  "RETICCTPCT" in the last 72 hours. Sepsis Labs: Recent Labs  Lab 06/27/23 0230 06/27/23 0439  LATICACIDVEN 1.8 1.0   Recent Results (from the past 240 hours)  Blood Culture (routine x 2)     Status: None (Preliminary result)   Collection Time: 06/27/23  2:16 AM   Specimen: BLOOD  Result Value Ref Range Status   Specimen Description BLOOD SITE NOT SPECIFIED  Final   Special Requests   Final    BOTTLES DRAWN AEROBIC AND ANAEROBIC Blood Culture adequate volume  Culture   Final    NO GROWTH 2 DAYS Performed at [facility], [address], [place], [place] [address]    Report Status PENDING  Incomplete  Blood Culture (routine x 2)     Status: None (Preliminary result)   Collection Time: 06/27/23  2:25 AM   Specimen: BLOOD  Result Value Ref Range Status   Specimen Description BLOOD SITE NOT SPECIFIED  Final   Special Requests   Final    BOTTLES DRAWN AEROBIC AND ANAEROBIC Blood Culture adequate volume   Culture   Final    NO GROWTH 2 DAYS Performed at [facility], [address], [place], [place] [address]    Report Status PENDING  Incomplete  Aerobic Culture w Gram Stain (superficial specimen)     Status: None   Collection Time: 06/27/23  3:27 AM   Specimen: Leg  Result Value Ref Range Status   Specimen Description LEG RIGHT  Final   Special Requests NONE  Final   Gram Stain NO WBC SEEN RARE GRAM POSITIVE COCCI IN SINGLES   Final   Culture   Final    MODERATE METHICILLIN RESISTANT STAPHYLOCOCCUS AUREUS FEW STREPTOCOCCUS GROUP G Beta hemolytic streptococci are predictably susceptible to penicillin  and other beta lactams. Susceptibility testing not routinely performed. Performed at [facility], [address] [place], [place] [address]    Report Status 06/29/2023 FINAL  Final   Organism ID, Bacteria METHICILLIN RESISTANT STAPHYLOCOCCUS AUREUS  Final      Susceptibility   Methicillin resistant staphylococcus aureus - MIC*    CIPROFLOXACIN >=8  RESISTANT Resistant     ERYTHROMYCIN >=8 RESISTANT Resistant     GENTAMICIN <=0.5 SENSITIVE Sensitive     OXACILLIN >=4 RESISTANT Resistant     TETRACYCLINE <=1 SENSITIVE Sensitive     VANCOMYCIN  1 SENSITIVE Sensitive     TRIMETH/SULFA >=320 RESISTANT Resistant     CLINDAMYCIN  <=0.25 SENSITIVE Sensitive     RIFAMPIN <=0.5 SENSITIVE Sensitive     Inducible Clindamycin  NEGATIVE Sensitive     LINEZOLID  2 SENSITIVE Sensitive     * MODERATE METHICILLIN RESISTANT STAPHYLOCOCCUS AUREUS  Surgical PCR screen     Status: Abnormal   Collection Time: 06/27/23  4:03 PM   Specimen: Nasal Mucosa; Nasal Swab  Result Value Ref Range Status   MRSA, PCR POSITIVE (A) NEGATIVE Final    Comment: RESULT CALLED TO, READ BACK BY AND VERIFIED WITH: RN CLINT WOOTEN ON 06/27/23 @ 1818 BY DRT    Staphylococcus aureus POSITIVE (A) NEGATIVE Final    Comment: (NOTE) The Xpert SA Assay (FDA approved for NASAL specimens in patients 20 years of age and older), is one component of a comprehensive surveillance program. It is not intended to diagnose infection nor to guide or monitor treatment. Performed at [facility], [address], [place], [place] [address]   Aerobic/Anaerobic Culture w Gram Stain (surgical/deep wound)     Status: None (Preliminary result)   Collection Time: 06/29/23 10:39 AM   Specimen: Leg, Right; Abscess  Result Value Ref Range Status   Specimen Description TISSUE  Final   Special Requests A SOFT TIS ABSCESS  Final   Gram Stain   Final    ABUNDANT WBC PRESENT, PREDOMINANTLY PMN NO ORGANISMS SEEN Performed at [facility], [address] [place], [place] [address]    Culture PENDING  Incomplete   Report Status PENDING  Incomplete  Aerobic/Anaerobic Culture w Gram Stain (  surgical/deep wound)     Status: None (Preliminary result)   Collection Time: 06/29/23 10:41 AM   Specimen: Leg, Right; Abscess  Result Value Ref Range Status   Specimen Description ABSCESS  Final    Special Requests B  Final   Gram Stain   Final    ABUNDANT WBC PRESENT, PREDOMINANTLY PMN NO ORGANISMS SEEN Performed at [facility], [address], [place], [place] [address]    Culture PENDING  Incomplete   Report Status PENDING  Incomplete  Aerobic/Anaerobic Culture w Gram Stain (surgical/deep wound)     Status: None (Preliminary result)   Collection Time: 06/29/23 10:41 AM   Specimen: Leg, Right; Abscess  Result Value Ref Range Status   Specimen Description ABSCESS  Final   Special Requests C  Final   Gram Stain   Final    ABUNDANT WBC PRESENT, PREDOMINANTLY PMN NO ORGANISMS SEEN Performed at [facility], [address], [place], [place] [address]    Culture PENDING  Incomplete   Report Status PENDING  Incomplete    Radiology Studies: DG Knee 1-2 Views Right Result Date: 06/29/2023 CLINICAL DATA:  Irrigation debridement of right knee EXAM: Intraoperative fluoroscopy COMPARISON:  Preop x-ray 06/27/2023 FINDINGS: Four fluoroscopic spot images demonstrate current ties of bone along the upper tibial metaphysis with placement of presumed antibiotic beads. Imaging was obtained to aid in treatment. Please correlate with real-time fluoroscopy 7.8 seconds. Cumulative dose 0.63 mGy IMPRESSION: Intraoperative fluoroscopy Electronically Signed   By: Adrianna Horde M.D.   On: 06/29/2023 11:24   DG C-Arm 1-60 Min-No Report Result Date: 06/29/2023 Fluoroscopy was utilized by the requesting physician.  No radiographic interpretation.   Scheduled Meds:  diltiazem   180 mg Oral Daily   docusate sodium   100 mg Oral BID   enoxaparin  (LOVENOX ) injection  40 mg Subcutaneous Q24H   ketorolac   15 mg Intravenous Q6H   metoprolol  succinate  50 mg Oral Daily   mupirocin  ointment  1 Application Nasal BID   Continuous Infusions:  vancomycin  150 mL/hr at 06/29/23 1533    LOS: 2 days   Aura Leeds, DO [facility] Available via Epic secure chat 7am-7pm After these hours, please  refer to coverage provider listed on [email or web address] 06/29/2023, 5:15 PM

## 2023-06-30 ENCOUNTER — Encounter (HOSPITAL_COMMUNITY): Payer: Self-pay | Admitting: Student

## 2023-06-30 DIAGNOSIS — B9562 Methicillin resistant Staphylococcus aureus infection as the cause of diseases classified elsewhere: Secondary | ICD-10-CM

## 2023-06-30 DIAGNOSIS — M86161 Other acute osteomyelitis, right tibia and fibula: Secondary | ICD-10-CM

## 2023-06-30 DIAGNOSIS — T8453XA Infection and inflammatory reaction due to internal right knee prosthesis, initial encounter: Secondary | ICD-10-CM

## 2023-06-30 DIAGNOSIS — T8450XD Infection and inflammatory reaction due to unspecified internal joint prosthesis, subsequent encounter: Secondary | ICD-10-CM | POA: Diagnosis not present

## 2023-06-30 DIAGNOSIS — M869 Osteomyelitis, unspecified: Secondary | ICD-10-CM | POA: Diagnosis not present

## 2023-06-30 LAB — CBC WITH DIFFERENTIAL/PLATELET
Abs Immature Granulocytes: 0.02 10*3/uL (ref 0.00–0.07)
Basophils Absolute: 0 10*3/uL (ref 0.0–0.1)
Basophils Relative: 0 %
Eosinophils Absolute: 0 10*3/uL (ref 0.0–0.5)
Eosinophils Relative: 0 %
HCT: 33.5 % — ABNORMAL LOW (ref 39.0–52.0)
Hemoglobin: 11.2 g/dL — ABNORMAL LOW (ref 13.0–17.0)
Immature Granulocytes: 0 %
Lymphocytes Relative: 6 %
Lymphs Abs: 0.5 10*3/uL — ABNORMAL LOW (ref 0.7–4.0)
MCH: 28.2 pg (ref 26.0–34.0)
MCHC: 33.4 g/dL (ref 30.0–36.0)
MCV: 84.4 fL (ref 80.0–100.0)
Monocytes Absolute: 0.4 10*3/uL (ref 0.1–1.0)
Monocytes Relative: 5 %
Neutro Abs: 7.1 10*3/uL (ref 1.7–7.7)
Neutrophils Relative %: 89 %
Platelets: 293 10*3/uL (ref 150–400)
RBC: 3.97 MIL/uL — ABNORMAL LOW (ref 4.22–5.81)
RDW: 15.8 % — ABNORMAL HIGH (ref 11.5–15.5)
WBC: 7.9 10*3/uL (ref 4.0–10.5)
nRBC: 0 % (ref 0.0–0.2)

## 2023-06-30 LAB — COMPREHENSIVE METABOLIC PANEL WITH GFR
ALT: 11 U/L (ref 0–44)
AST: 16 U/L (ref 15–41)
Albumin: 1.9 g/dL — ABNORMAL LOW (ref 3.5–5.0)
Alkaline Phosphatase: 61 U/L (ref 38–126)
Anion gap: 8 (ref 5–15)
BUN: 17 mg/dL (ref 6–20)
CO2: 22 mmol/L (ref 22–32)
Calcium: 8.8 mg/dL — ABNORMAL LOW (ref 8.9–10.3)
Chloride: 101 mmol/L (ref 98–111)
Creatinine, Ser: 0.55 mg/dL — ABNORMAL LOW (ref 0.61–1.24)
GFR, Estimated: >60 mL/min (ref >60)
Glucose, Bld: 130 mg/dL — ABNORMAL HIGH (ref 70–99)
Potassium: 4.6 mmol/L (ref 3.5–5.1)
Sodium: 131 mmol/L — ABNORMAL LOW (ref 135–145)
Total Bilirubin: 1.2 mg/dL (ref 0.0–1.2)
Total Protein: 6.6 g/dL (ref 6.5–8.1)

## 2023-06-30 LAB — MAGNESIUM: Magnesium: 1.7 mg/dL (ref 1.7–2.4)

## 2023-06-30 LAB — AMMONIA: Ammonia: 55 umol/L — ABNORMAL HIGH (ref 9–35)

## 2023-06-30 LAB — PHOSPHORUS: Phosphorus: 3 mg/dL (ref 2.5–4.6)

## 2023-06-30 LAB — PROTIME-INR
INR: 1.3 — ABNORMAL HIGH (ref 0.8–1.2)
Prothrombin Time: 16 s — ABNORMAL HIGH (ref 11.4–15.2)

## 2023-06-30 MED ORDER — SODIUM CHLORIDE 0.9% FLUSH
10.0000 mL | Freq: Two times a day (BID) | INTRAVENOUS | Status: DC
  Administered 2023-07-01 – 2023-07-04 (×4): 10 mL

## 2023-06-30 MED ORDER — ASPIRIN 325 MG PO TABS
325.0000 mg | ORAL_TABLET | Freq: Every day | ORAL | Status: DC
  Administered 2023-06-30 – 2023-07-07 (×8): 325 mg via ORAL
  Filled 2023-06-30 (×8): qty 1

## 2023-06-30 MED ORDER — KETOROLAC TROMETHAMINE 15 MG/ML IJ SOLN
15.0000 mg | Freq: Four times a day (QID) | INTRAMUSCULAR | Status: AC
  Administered 2023-06-30 – 2023-07-04 (×14): 15 mg via INTRAVENOUS
  Filled 2023-06-30 (×14): qty 1

## 2023-06-30 MED ORDER — SODIUM CHLORIDE 0.9% FLUSH: 10.0000 mL | INTRAVENOUS | Status: DC | PRN

## 2023-06-30 NOTE — Plan of Care (Signed)
 Upon initial assessment this AM, bed was soiled with blood and dressing was off. Per pt, he stated he took it off. Did not answer why he took dressing off. New dressing was applied.  I was able to change his blanket but pt refused to turn and allow me to change his fitted sheet that was soiled with blood as well.   Pt stated continuous 10/10 pain but was able to sleep most of the shift.  Upon end of shift, bed was once again soiled with blood due to patient removing dressing once again. Pt refused bed change.     Problem: Clinical Measurements: Goal: Ability to maintain clinical measurements within normal limits will improve Outcome: Progressing Goal: Will remain free from infection Outcome: Progressing   Problem: Pain Managment: Goal: General experience of comfort will improve and/or be controlled Outcome: Not Progressing

## 2023-06-30 NOTE — Plan of Care (Signed)
   Problem: Education: Goal: Knowledge of General Education information will improve Description Including pain rating scale, medication(s)/side effects and non-pharmacologic comfort measures Outcome: Progressing   Problem: Health Behavior/Discharge Planning: Goal: Ability to manage health-related needs will improve Outcome: Progressing

## 2023-06-30 NOTE — Progress Notes (Signed)
 Orthopaedic Trauma Progress Note  SUBJECTIVE: Patient lying in bed this morning, does not want to participate in exam.  Notes pain in the right lower extremity.  Pulled his dressing off overnight and noted to have drainage from his incision.  Nursing has been changing the dressings as needed.  1/3 intraoperative cultures growing Staph aureus.  OBJECTIVE:  Vitals:   06/30/23 0836 06/30/23 0836  BP: (!) 136/91   Pulse: 66   Resp: 18   Temp:  97.7 F (36.5 C)  SpO2: 98%     Opiates Today (MME): Today's  total administered Morphine  Milligram Equivalents: 22.5 Opiates Yesterday (MME): Yesterday's total administered Morphine  Milligram Equivalents: 37.5  General: Laying in bed.  Does not want to sit in exam Respiratory: No increased work of breathing.  Operative Extremity (RLE): Dressing changed, some serosanguineous drainage on ABDs but no active drainage from incisions noted currently.  New Adaptic, 4 x 4's, ABD, Kerlix, and Ace wrap applied to the knee.  Patient able to move the knee some but notes pain with this.  + DP pulse  IMAGING: Preoperative MRI showed large knee joint effusion with intraosseous abscess   LABS:  Results for orders placed or performed during the hospital encounter of 06/26/23 (from the past 24 hours)  Vancomycin , trough     Status: None   Collection Time: 06/29/23  6:05 PM  Result Value Ref Range   Vancomycin  Tr 16 15 - 20 ug/mL  CBC with Differential/Platelet     Status: Abnormal   Collection Time: 06/30/23  7:07 AM  Result Value Ref Range   WBC 7.9 4.0 - 10.5 K/uL   RBC 3.97 (L) 4.22 - 5.81 MIL/uL   Hemoglobin 11.2 (L) 13.0 - 17.0 g/dL   HCT 16.1 (L) 09.6 - 04.5 %   MCV 84.4 80.0 - 100.0 fL   MCH 28.2 26.0 - 34.0 pg   MCHC 33.4 30.0 - 36.0 g/dL   RDW 40.9 (H) 81.1 - 91.4 %   Platelets 293 150 - 400 K/uL   nRBC 0.0 0.0 - 0.2 %   Neutrophils Relative % 89 %   Neutro Abs 7.1 1.7 - 7.7 K/uL   Lymphocytes Relative 6 %   Lymphs Abs 0.5 (L) 0.7 - 4.0 K/uL    Monocytes Relative 5 %   Monocytes Absolute 0.4 0.1 - 1.0 K/uL   Eosinophils Relative 0 %   Eosinophils Absolute 0.0 0.0 - 0.5 K/uL   Basophils Relative 0 %   Basophils Absolute 0.0 0.0 - 0.1 K/uL   Immature Granulocytes 0 %   Abs Immature Granulocytes 0.02 0.00 - 0.07 K/uL  Comprehensive metabolic panel with GFR     Status: Abnormal   Collection Time: 06/30/23  7:07 AM  Result Value Ref Range   Sodium 131 (L) 135 - 145 mmol/L   Potassium 4.6 3.5 - 5.1 mmol/L   Chloride 101 98 - 111 mmol/L   CO2 22 22 - 32 mmol/L   Glucose, Bld 130 (H) 70 - 99 mg/dL   BUN 17 6 - 20 mg/dL   Creatinine, Ser 7.82 (L) 0.61 - 1.24 mg/dL   Calcium  8.8 (L) 8.9 - 10.3 mg/dL   Total Protein 6.6 6.5 - 8.1 g/dL   Albumin 1.9 (L) 3.5 - 5.0 g/dL   AST 16 15 - 41 U/L   ALT 11 0 - 44 U/L   Alkaline Phosphatase 61 38 - 126 U/L   Total Bilirubin 1.2 0.0 - 1.2 mg/dL   GFR,  Estimated >60 >60 mL/min   Anion gap 8 5 - 15  Phosphorus     Status: None   Collection Time: 06/30/23  7:07 AM  Result Value Ref Range   Phosphorus 3.0 2.5 - 4.6 mg/dL  Magnesium      Status: None   Collection Time: 06/30/23  7:07 AM  Result Value Ref Range   Magnesium  1.7 1.7 - 2.4 mg/dL  Protime-INR     Status: Abnormal   Collection Time: 06/30/23  7:07 AM  Result Value Ref Range   Prothrombin Time 16.0 (H) 11.4 - 15.2 seconds   INR 1.3 (H) 0.8 - 1.2  Ammonia     Status: Abnormal   Collection Time: 06/30/23  7:07 AM  Result Value Ref Range   Ammonia 55 (H) 9 - 35 umol/L    ASSESSMENT: Auner Dettling is a 58 y.o. male, 1 Day Post-Op s/p IRRIGATION AND DEBRIDEMENT RIGHT KNEE  CV/Blood loss: Hemoglobin 11.2 this morning.  Stable from preop.  Hemodynamically stable  PLAN: Weightbearing: WBAT RLE ROM: Unrestricted ROM Incisional and dressing care: Change dressing as needed Showering: Hold off on getting incision wet until drainage stops.   Orthopedic device(s): None  Pain management:  1. Toradol  15 mg q 6 hours x5  days 2. Robaxin  500 mg q 6 hours PRN 3. Oxycodone  15 mg q 4 hours PRN 4. Dilaudid  1 mg q 4 hours PRN VTE prophylaxis: Aspirin , SCDs ID: Vancomycin  Foley/Lines:  No foley, KVO IVFs Dispo: PT/OT evaluations as able.  Continue to follow intraoperative cultures.  Appreciate assistance from infectious disease and medicine team in the care of this patient.    Follow - up plan: 2 weeks after d/c for wound check and repeat x-rays   Contact information:  Katheryne Pane MD, Alona Jamaica PA-C. After hours and holidays please check [email or web address] for group call information for [facility]   Edilia Gordon, PA-C [phone] (office) [email or web address]

## 2023-06-30 NOTE — TOC Progression Note (Signed)
 Transition of Care [facility]) - Progression Note    Patient Details  Name: Jonathan Ibarra MRN: [id number] Date of Birth: 07-30-1965  [facility] CM/SW Contact  Katrinka Parr, [place] Phone Number: 06/30/2023, 12:49 PM  Clinical Narrative:     Met with pt for SNF choice. He is lethargic though responds to CSW's questions. When asked for SNF choice, pt states he wants to go home instead. Pt did not respond to CSW's further questions about home environment and closed his eyes. CSW updated RNCM.    Expected Discharge Plan: Skilled Nursing Facility Barriers to Discharge: Insurance Authorization  Expected Discharge Plan and Services   Discharge Planning Services: CM Consult Post Acute Care Choice:  (await PT eval post op) Living arrangements for the past 2 months: Single Family Home                 DME Arranged:  (await post op eval)         HH Arranged:  (see note)           Social Determinants of Health (SDOH) Interventions SDOH Screenings   Food Insecurity: Food Insecurity Present (06/27/2023)  Housing: High Risk (06/27/2023)  Transportation Needs: Unmet Transportation Needs (06/27/2023)  Utilities: Not At Risk (06/27/2023)  Financial Resource Strain: Medium Risk (01/07/2020)   Received from [facility], [facility]  Social Connections: Unknown (07/05/2021)   Received from [facility], [facility]  Stress: No Stress Concern Present (01/07/2020)   Received from [facility], [facility]  Tobacco Use: High Risk (06/29/2023)    Readmission Risk Interventions     No data to display

## 2023-06-30 NOTE — Progress Notes (Signed)
 OT Cancellation Note  Patient Details Name: Jonathan Ibarra MRN: [id number] DOB: 12/06/1965   Cancelled Treatment:    Reason Eval/Treat Not Completed: Patient declined, no reason specified. Pt in bed ref to open eyes. Despite max encouragement to mobilize or complete ADLs pt ref stating "No,  I am not getting up". OT will return as able to.   Halla Chopp C, OT  Acute Rehabilitation Services Office [phone] Secure chat preferred   Mickael Alamo 06/30/2023, 1:05 PM

## 2023-06-30 NOTE — Progress Notes (Signed)
 PT Cancellation Note  Patient Details Name: Jonathan Ibarra MRN: [id number] DOB: 11-15-1965   Cancelled Treatment:    Reason Eval/Treat Not Completed: Patient declined, no reason specified(will follow up at later date/time as schedule allows).   Tish Forge, DPT Acute Rehabilitation Services Office [phone]  06/30/23 2:53 PM

## 2023-06-30 NOTE — TOC Progression Note (Signed)
 Transition of Care (TOC) - Progression Note   Patient told social worker he did not want to go to SNF at discharge for short term rehab .   NCM went to room to discuss DME needs etc for home. Patient would respond to his name , but then closed eyes and would not discuss disposition. NCm will follow up later   Patient Details  Name: Jonathan Ibarra MRN: [id number] Date of Birth: 30-Aug-1965  Transition of Care [facility]) CM/SW Contact  Maleny Candy, [name], RN Phone Number: 06/30/2023, 12:48 PM  Clinical Narrative:       Expected Discharge Plan: Skilled Nursing Facility Barriers to Discharge: Insurance Authorization  Expected Discharge Plan and Services   Discharge Planning Services: CM Consult Post Acute Care Choice:  (await PT eval post op) Living arrangements for the past 2 months: Single Family Home                 DME Arranged:  (await post op eval)         HH Arranged:  (see note)           Social Determinants of Health (SDOH) Interventions SDOH Screenings   Food Insecurity: Food Insecurity Present (06/27/2023)  Housing: High Risk (06/27/2023)  Transportation Needs: Unmet Transportation Needs (06/27/2023)  Utilities: Not At Risk (06/27/2023)  Financial Resource Strain: Medium Risk (01/07/2020)   Received from [facility], [facility]  Social Connections: Unknown (07/05/2021)   Received from [facility], [facility]  Stress: No Stress Concern Present (01/07/2020)   Received from [facility], [facility]  Tobacco Use: High Risk (06/29/2023)    Readmission Risk Interventions     No data to display

## 2023-06-30 NOTE — Progress Notes (Signed)
 PROGRESS NOTE    Jonathan Ibarra  BMW:[id number] DOB: 10-26-1965 DOA: 06/26/2023 PCP: Sharry Deem, MD   Brief Narrative:  Jonathan Ibarra is a 58 y.o. male with past medical history significant for tibial plateau fracture, right knee septic arthritis with hardware removal, I&D, wound VAC placement February 2025, HTN, SVT, paroxysmal atrial fibrillation not on anticoagulation, cirrhosis s/p TIPS, chronic thrombocytopenia, anemia of chronic medical disease, polysubstance abuse who presented to [facility] ED on 06/26/2023 with right knee pain, swelling, drainage. He reported he was in his usual state of health until this past week when he noticed increased pain, swelling, erythema and puslike drainage from his right knee.    Imaging was done and MR right knee without contrast showed enlarging complex knee joint effusion with diffuse periarticular marrow edema suspicious for septic arthritis and osteomyelitis, probable associated intraosseous abscess proximal tibia with fistulous tract communicating with a lateral soft tissue component, extensive periarticular soft tissue edema surrounding the knee.  Blood cultures x 2 obtained.  Patient was started on a Cardizem  drip for A-fib with RVR.  Orthopedics consulted and ID consulted. 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  He underwent I&D and is POD day 1 with I&D. PT/OT recommending SNF.  Assessment and Plan:   Right knee septic arthritis with abscess with Concern for osteomyelitis: Patient presenting with 1 week history of progressive right knee swelling, erythema, pain with purulent discharge.  Noncompliant with outpatient therapy including IV antibiotics and follow-up with infectious disease.  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  Patient is afebrile without leukocytosis.  MR right  knee with findings consistent with acute septic arthritis with concern for osteomyelitis and abscess formation.  Seen by orthopedics, Dr. Curtiss Dowdy who initially recommended [facility], but patient declined with planned I&D on 4/30. CRP was 8.7. Orthopedics ([facility]) following and consulted ID. Blood cultures x 2: No growth x 2 days;  Superficial leg culture: + MRSA; C/w  IV Vancomycin  @ current dose, pharmacy consulted for dosing/monitoring. Pain control w/ Oxycodone  15 mg p.o. every 4 hours as needed severe pain, Robaxin  750 mg p.o. every 8 hours as needed muscle spasms, and Hydromorphone  1 mg IV every 4 hours as needed severe pain not relieved with oral medication. S/p Excision of intraosseous abscess R Proximal Tibia and I&D of R Leg Abscess and Placement of Abx Beads Intraosseously.  Operative cultures are growing 1 out of 3 Staph aureus.  ID recommends continuing dose of vancomycin  for now and monitoring sensitivities and cultures. -Patient does not want to go to SNF and wants to go home instead.  PT/OT recommending SNF.   Paroxysmal atrial fibrillation with RVR  Essential HTN: Patient presenting with elevated heart rate of 153.  Initially placed on Cardizem  drip.  Home medications restarted and was able to be titrated off.  Not on anticoagulation outpatient. C/w Diltiazem  180 mg p.o. daily and Metoprolol  Succinate 50 mg p.o. daily. HR remains well-controlled now, discontinue telemetry   Cirrhosis s/p TIPS: C/w 2 gram Low-salt diet. Will need  Outpatient follow-up with GI. Bilirubin was elevated at 1.5 on last check but LFTs normal. CTM and Trend and watch for decompensation. Checked PT-INR and was 16.0-1.3 respectively.    Anemia of chronic medical disease: Hgb/Hct went from 13.0/40.9 -> 10.9/33.1 -> 11.2/33.5. Check Anemia Panel in the AM. CTM for S/Sx of Bleeding; No overt bleeding noted.  Repeat CBC in the AM   Polysubstance use: UDS positive for amphetamines, opiates, THC.  Counseled on need for complete  abstinence/cessation from illicit substances.  Hypoalbuminemia: Patient's Albumin went from 2.4 -> 1.9. CTM and Trend and repeat CMP in the AM  Class I Obesity: Complicates overall prognosis and care. Estimated body mass index is 33.64 kg/m as calculated from the following:   Height as of this encounter: 6\' 1"  (1.854 m).   Weight as of this encounter: 115.7 kg. Weight Loss and Dietary Counseling given   DVT prophylaxis: SCDs Start: 06/29/23 1203    Code Status: Full Code Family Communication: No family present at bedside   Disposition Plan:  Level of care: Med-Surg Status is: Inpatient Remains inpatient appropriate because: Needs antibiotic regimen for discharge and PT OT recommending SNF   Consultants:  Orthopedic Surgery ID  Procedures:  Procedures: CPT 27360-Excision of intraosseous abscess right proximal tibia CPT 27603-Incision and drainage of right leg abscess CPT 11981-Placement of antibiotic beads intraosseously  Antimicrobials:  Anti-infectives (From admission, onward)    Start     Dose/Rate Route Frequency Ordered Stop   06/29/23 2200  vancomycin  (VANCOCIN ) IVPB 1000 mg/200 mL premix        1,000 mg 200 mL/hr over 60 Minutes Intravenous Every 12 hours 06/29/23 2010     06/29/23 1038  gentamicin  (GARAMYCIN ) injection  Status:  Discontinued          As needed 06/29/23 1038 06/29/23 1108   06/29/23 1029  tobramycin  (NEBCIN ) powder  Status:  Discontinued          As needed 06/29/23 1029 06/29/23 1108   06/29/23 1029  vancomycin  (VANCOCIN ) powder  Status:  Discontinued          As needed 06/29/23 1030 06/29/23 1108   06/29/23 0845  ceFAZolin  (ANCEF ) IVPB 2g/100 mL premix        2 g 200 mL/hr over 30 Minutes Intravenous On call to O.R. 06/29/23 0837 06/29/23 1018   06/29/23 0805  ceFAZolin  (ANCEF ) 2-4 GM/100ML-% IVPB       Note to Pharmacy: Emeterio Hansen, GRETA: cabinet override      06/29/23 0805 06/29/23 1019   06/27/23 1800  vancomycin  (VANCOREADY) IVPB 1500 mg/300 mL   Status:  Discontinued        1,500 mg 150 mL/hr over 120 Minutes Intravenous Every 12 hours 06/27/23 1429 06/29/23 2010   06/27/23 0315  cefTRIAXone  (ROCEPHIN ) 2 g in sodium chloride  0.9 % 100 mL IVPB        2 g 200 mL/hr over 30 Minutes Intravenous Once 06/27/23 0313 06/27/23 0512   06/27/23 0315  vancomycin  (VANCOCIN ) IVPB 1000 mg/200 mL premix  Status:  Discontinued        1,000 mg 200 mL/hr over 60 Minutes Intravenous  Once 06/27/23 0313 06/27/23 0314   06/27/23 0315  vancomycin  (VANCOREADY) IVPB 2000 mg/400 mL        2,000 mg 200 mL/hr over 120 Minutes Intravenous  Once 06/27/23 0314 06/27/23 0701       Subjective: Seen and examined at bedside is withdrawn and complaining of some knee pain.  Had ripped out his dressings and did not really want to interact with me.  States that he wanted to rest.  No other concerns or complaints at this time.  Objective: Vitals:   06/30/23 0836 06/30/23 0836 06/30/23 1237 06/30/23 1643  BP: (!) 136/91  124/80 134/72  Pulse: 66  79 67  Resp: 18  16 17  Temp:  97.7 F (36.5 C) 97.8 F (36.6 C)   TempSrc:  Oral Oral   SpO2: 98%  96% 100%  Weight:      Height:        Intake/Output Summary (Last 24 hours) at 06/30/2023 1936 Last data filed at 06/30/2023 1000 Gross per 24 hour  Intake 440 ml  Output 300 ml  Net 140 ml   Filed Weights   06/26/23 2340  Weight: 115.7 kg   Examination: Physical Exam:  Constitutional: WN/WD obese Caucasian chronically ill-appearing male who is a little drowsy and withdrawn Respiratory: Diminished to auscultation bilaterally, no wheezing, rales, rhonchi or crackles. Normal respiratory effort and patient is not tachypenic. No accessory muscle use.  Unlabored breathing Cardiovascular: RRR, no murmurs / rubs / gallops. S1 and S2 auscultated.  Right leg is wrapped Abdomen: Soft, non-tender, non-distended. No masses palpated. No appreciable hepatosplenomegaly. Bowel sounds positive.  GU: Deferred. Musculoskeletal:  No clubbing / cyanosis of digits/nails. No joint deformity upper and lower extremities. Good ROM, no contractures. Normal strength and muscle tone.  Skin: Right leg dressing is not in place.  Has multiple scattered tattoos diffusely throughout his body. Neurologic: Withdrawn and drowsy and does not really want to interact but does acknowledge me Psychiatric: Very withdrawn   Data Reviewed: I have personally reviewed following labs and imaging studies  CBC: Recent Labs  Lab 06/27/23 0219 06/29/23 0655 06/30/23 0707  WBC 6.9 6.2 7.9  NEUTROABS 4.8  --  7.1  HGB 13.0 10.9* 11.2*  HCT 40.9 33.1* 33.5*  MCV 89.5 87.3 84.4  PLT 298 267 293   Basic Metabolic Panel: Recent Labs  Lab 06/27/23 0219 06/29/23 0651 06/29/23 0655 06/30/23 0707  NA 138 132* 131* 131*  K 3.1* 4.7 4.6 4.6  CL 104 100 100 101  CO2 24 26 25 22   GLUCOSE 56* 103* 109* 130*  BUN 9 13 13 17   CREATININE 0.71 0.80 0.80 0.55*  CALCIUM  8.8* 8.4* 8.3* 8.8*  MG  --   --  1.8 1.7  PHOS  --  2.8  --  3.0   GFR: Estimated Creatinine Clearance: 137.4 mL/min (A) (by C-G formula based on SCr of 0.55 mg/dL (L)). Liver Function Tests: Recent Labs  Lab 06/27/23 0219 06/29/23 0651 06/30/23 0707  AST 24 16 16   ALT 12 9 11   ALKPHOS 100 76 61  BILITOT 1.2 1.5* 1.2  PROT 7.8 6.4* 6.6  ALBUMIN 2.4* 1.9* 1.9*   No results for input(s): "LIPASE", "AMYLASE" in the last 168 hours. Recent Labs  Lab 06/30/23 0707  AMMONIA 55*   Coagulation Profile: Recent Labs  Lab 06/27/23 0219 06/30/23 0707  INR 1.2 1.3*   Cardiac Enzymes: No results for input(s): "CKTOTAL", "CKMB", "CKMBINDEX", "TROPONINI" in the last 168 hours. BNP (last 3 results) No results for input(s): "PROBNP" in the last 8760 hours. HbA1C: No results for input(s): "HGBA1C" in the last 72 hours. CBG: Recent Labs  Lab 06/27/23 0607  GLUCAP 82   Lipid Profile: No results for input(s): "CHOL", "HDL", "LDLCALC", "TRIG", "CHOLHDL", "LDLDIRECT" in the  last 72 hours. Thyroid Function Tests: No results for input(s): "TSH", "T4TOTAL", "FREET4", "T3FREE", "THYROIDAB" in the last 72 hours. Anemia Panel: No results for input(s): "VITAMINB12", "FOLATE", "FERRITIN", "TIBC", "IRON", "RETICCTPCT" in the last 72 hours. Sepsis Labs: Recent Labs  Lab 06/27/23 0230 06/27/23 0439  LATICACIDVEN 1.8 1.0    Recent Results (from the past 240 hours)  Blood Culture (routine x 2)  Status: None (Preliminary result)   Collection Time: 06/27/23  2:16 AM   Specimen: BLOOD  Result Value Ref Range Status   Specimen Description BLOOD SITE NOT SPECIFIED  Final   Special Requests   Final    BOTTLES DRAWN AEROBIC AND ANAEROBIC Blood Culture adequate volume   Culture   Final    NO GROWTH 3 DAYS Performed at [facility], [address], [place], [place] [address]    Report Status PENDING  Incomplete  Blood Culture (routine x 2)     Status: None (Preliminary result)   Collection Time: 06/27/23  2:25 AM   Specimen: BLOOD  Result Value Ref Range Status   Specimen Description BLOOD SITE NOT SPECIFIED  Final   Special Requests   Final    BOTTLES DRAWN AEROBIC AND ANAEROBIC Blood Culture adequate volume   Culture   Final    NO GROWTH 3 DAYS Performed at [facility], [address] [place], [place] [address]    Report Status PENDING  Incomplete  Aerobic Culture w Gram Stain (superficial specimen)     Status: None   Collection Time: 06/27/23  3:27 AM   Specimen: Leg  Result Value Ref Range Status   Specimen Description LEG RIGHT  Final   Special Requests NONE  Final   Gram Stain NO WBC SEEN RARE GRAM POSITIVE COCCI IN SINGLES   Final   Culture   Final    MODERATE METHICILLIN RESISTANT STAPHYLOCOCCUS AUREUS FEW STREPTOCOCCUS GROUP G Beta hemolytic streptococci are predictably susceptible to penicillin  and other beta lactams. Susceptibility testing not routinely performed. Performed at [facility], [address], [place],  [place] [address]    Report Status 06/29/2023 FINAL  Final   Organism ID, Bacteria METHICILLIN RESISTANT STAPHYLOCOCCUS AUREUS  Final      Susceptibility   Methicillin resistant staphylococcus aureus - MIC*    CIPROFLOXACIN >=8 RESISTANT Resistant     ERYTHROMYCIN >=8 RESISTANT Resistant     GENTAMICIN  <=0.5 SENSITIVE Sensitive     OXACILLIN >=4 RESISTANT Resistant     TETRACYCLINE <=1 SENSITIVE Sensitive     VANCOMYCIN  1 SENSITIVE Sensitive     TRIMETH/SULFA >=320 RESISTANT Resistant     CLINDAMYCIN  <=0.25 SENSITIVE Sensitive     RIFAMPIN <=0.5 SENSITIVE Sensitive     Inducible Clindamycin  NEGATIVE Sensitive     LINEZOLID  2 SENSITIVE Sensitive     * MODERATE METHICILLIN RESISTANT STAPHYLOCOCCUS AUREUS  Surgical PCR screen     Status: Abnormal   Collection Time: 06/27/23  4:03 PM   Specimen: Nasal Mucosa; Nasal Swab  Result Value Ref Range Status   MRSA, PCR POSITIVE (A) NEGATIVE Final    Comment: RESULT CALLED TO, READ BACK BY AND VERIFIED WITH: RN CLINT WOOTEN ON 06/27/23 @ 1818 BY DRT    Staphylococcus aureus POSITIVE (A) NEGATIVE Final    Comment: (NOTE) The Xpert SA Assay (FDA approved for NASAL specimens in patients 96 years of age and older), is one component of a comprehensive surveillance program. It is not intended to diagnose infection nor to guide or monitor treatment. Performed at [facility], [address], [place], [place] [address]   Aerobic/Anaerobic Culture w Gram Stain (surgical/deep wound)     Status: None (Preliminary result)   Collection Time: 06/29/23 10:39 AM   Specimen: Leg, Right; Abscess  Result Value Ref Range Status   Specimen Description TISSUE  Final   Special Requests A SOFT TIS ABSCESS  Final   Gram Stain   Final    ABUNDANT WBC PRESENT, PREDOMINANTLY PMN NO ORGANISMS SEEN    Culture   Final    CULTURE REINCUBATED FOR BETTER GROWTH Performed at [facility], [address], [place], [place] [address]    Report Status PENDING   Incomplete  Aerobic/Anaerobic Culture w Gram Stain (surgical/deep wound)     Status: None (Preliminary result)   Collection Time: 06/29/23 10:41 AM   Specimen: Leg, Right; Abscess  Result Value Ref Range Status   Specimen Description ABSCESS  Final   Special Requests B  Final   Gram Stain   Final    ABUNDANT WBC PRESENT, PREDOMINANTLY PMN NO ORGANISMS SEEN    Culture   Final    RARE STAPHYLOCOCCUS AUREUS CULTURE REINCUBATED FOR BETTER GROWTH Performed at [facility], [address], [place], [place] [address]    Report Status PENDING  Incomplete  Aerobic/Anaerobic Culture w Gram Stain (surgical/deep wound)     Status: None (Preliminary result)   Collection Time: 06/29/23 10:41 AM   Specimen: Leg, Right; Abscess  Result Value Ref Range Status   Specimen Description ABSCESS  Final   Special Requests C  Final   Gram Stain   Final    ABUNDANT WBC PRESENT, PREDOMINANTLY PMN NO ORGANISMS SEEN    Culture   Final    NO GROWTH < 24 HOURS Performed at [facility], [address] [place], [place] [address]    Report Status PENDING  Incomplete    Radiology Studies: DG Knee 1-2 Views Right Result Date: 06/29/2023 CLINICAL DATA:  Irrigation debridement of right knee EXAM: Intraoperative fluoroscopy COMPARISON:  Preop x-ray 06/27/2023 FINDINGS: Four fluoroscopic spot images demonstrate current ties of bone along the upper tibial metaphysis with placement of presumed antibiotic beads. Imaging was obtained to aid in treatment. Please correlate with real-time fluoroscopy 7.8 seconds. Cumulative dose 0.63 mGy IMPRESSION: Intraoperative fluoroscopy Electronically Signed   By: Adrianna Horde M.D.   On: 06/29/2023 11:24   DG C-Arm 1-60 Min-No Report Result Date: 06/29/2023 Fluoroscopy was utilized by the requesting physician.  No radiographic interpretation.   Scheduled Meds:  aspirin   325 mg Oral Daily   diltiazem   180 mg Oral Daily   docusate sodium   100 mg Oral BID   ketorolac    15 mg Intravenous Q6H   metoprolol  succinate  50 mg Oral Daily   mupirocin  ointment  1 Application Nasal BID   Continuous Infusions:  vancomycin  1,000 mg (06/30/23 1046)    LOS: 3 days   Aura Leeds, DO [facility] Available via Epic secure chat 7am-7pm After these hours, please refer to coverage provider listed on [email or web address] 06/30/2023, 7:36 PM

## 2023-06-30 NOTE — Consult Note (Signed)
 [facility]    Date of Admission:  06/26/2023     Total days of antibiotics 5               Reason for Consult: Prosthetic Joint Infection   Referring Provider: Dr. Gillermo Lack Primary Care Provider: Sharry Deem, MD   ASSESSMENT:  Jonathan Ibarra is a 58 y/o caucasian male with previous history of tibial plateau fracture complicated by septic arthritis status post hardware removal presenting with swelling and drainage from the right knee and found to have right tibial osteomyelitis with intraosseous abscess.  Postop day #1 from debridement with surgical specimens growing Staph aureus with sensitivities pending.  Continue current dose of vancomycin  and monitor cultures for sensitivities.  Would anticipate MRSA given his previous culture results.  Continue postoperative wound care per orthopedics.  Will need a prolonged course of at least 6 weeks of antibiotics with course and disposition to be determined given his less than optimal follow-up outpatient.  Likely remains a poor candidate for PICC line.  Therapeutic drug monitoring of vancomycin  levels and renal function.  Continue standard/universal precautions.  Postoperative wound care per orthopedics.  Remaining medical and supportive care per internal medicine.  PLAN:  Continue current dose of vancomycin . Monitor surgical specimens for sensitivities and blood cultures for bacteremia. Therapeutic drug monitoring of renal function and vancomycin  levels. Continue postoperative wound care per orthopedics. Universal/standard precautions. Remaining medical and supportive care per internal medicine.   Principal Problem:   Prosthetic joint infection, subsequent encounter Active Problems:   Osteomyelitis (HCC)    aspirin   325 mg Oral Daily   diltiazem   180 mg Oral Daily   docusate sodium   100 mg Oral BID   enoxaparin  (LOVENOX ) injection  40 mg Subcutaneous Q24H   metoprolol  succinate  50 mg Oral Daily    mupirocin  ointment  1 Application Nasal BID     HPI: Jonathan Ibarra is a 58 y.o. male with previous medical history of cirrhosis of the liver, hypertension, and history of tibial plateau fracture complicated by septic arthritis s/p hardware removal presenting with swelling and drainage from the right knee with concern for prosthetic joint infection.  Jonathan Ibarra is known to the ID service having last been seen on 05/02/23 nearing completion of 2 weeks of daptomycin  in the setting of MRSA bacteremia and septic arthritis of the right knee s/p hardware removal. Was not a candidate for PICC line and given oritavancin  and plan for one additional doses in 1 week. Unfortunately he did not receive the second dose of oritavancin  as he indicates Medicaid would not pay for the additional antibiotics. Now returning with swelling and drainage from the right knee.   Jonathan Ibarra was afebrile on admission with no leukocytosis.  Chest x-ray unremarkable.  Right knee x-ray with probable joint effusion and sequela of medial and lateral tibial plateau fracture status post hardware retrieval.  MRI right knee with enlarging complex knee joint effusion with diffuse periarticular marrow edema highly suspicious for septic arthritis and osteomyelitis with probable associated intraosseous abscess laterally in the proximal tibia with a fistulous tract communicating with the lateral soft tissue component.  Started on broad-spectrum antibiotics with vancomycin  and ceftriaxone .  Blood cultures obtained and open without growth to date.  Brought to the OR on 06/29/2023 for excision of intraosseous abscess and debridement of the right leg abscess.  Wound cultures growing Staph aureus with sensitivities pending the other is without growth and no organisms seen on  Gram stain.  Jonathan Ibarra is postop day #1 from debridement with antibiotics narrowed to vancomycin .  Blood cultures remain without growth to date.  Lethargic during visit and not able  to provide much additional history beyond chart review.  Review of Systems: Review of Systems  Unable to perform ROS: Other     Past Medical History:  Diagnosis Date   Alcohol abuse    Cirrhosis of liver (HCC)    Hypertension    PAF (paroxysmal atrial fibrillation) (HCC) 12/28/2022   SVT (supraventricular tachycardia) (HCC) 09/2022    Social History   Tobacco Use   Smoking status: Every Day    Current packs/day: 0.25    Types: Cigarettes   Smokeless tobacco: Never  Vaping Use   Vaping status: Every Day  Substance Use Topics   Alcohol use: No   Drug use: Yes    Types: Cocaine, Marijuana    History reviewed. No pertinent family history.  Allergies  Allergen Reactions   Tylenol  [Acetaminophen ] Other (See Comments)    Impacts liver    OBJECTIVE: Blood pressure (!) 136/91, pulse 66, temperature 97.7 F (36.5 C), temperature source Oral, resp. rate 18, height 6\' 1"  (1.854 m), weight 115.7 kg, SpO2 98%.  Physical Exam Constitutional:      General: He is sleeping. He is not in acute distress.    Appearance: He is well-developed.  Cardiovascular:     Rate and Rhythm: Normal rate and regular rhythm.     Heart sounds: Normal heart sounds.  Pulmonary:     Effort: Pulmonary effort is normal.     Breath sounds: Normal breath sounds.  Musculoskeletal:     Comments: Surgical wrap in place with drainage noted on bedding.  No active bleeding.  Skin:    General: Skin is warm and dry.  Neurological:     Mental Status: He is easily aroused. He is lethargic.     Lab Results Lab Results  Component Value Date   WBC 7.9 06/30/2023   HGB 11.2 (L) 06/30/2023   HCT 33.5 (L) 06/30/2023   MCV 84.4 06/30/2023   PLT 293 06/30/2023    Lab Results  Component Value Date   CREATININE 0.55 (L) 06/30/2023   BUN 17 06/30/2023   NA 131 (L) 06/30/2023   K 4.6 06/30/2023   CL 101 06/30/2023   CO2 22 06/30/2023    Lab Results  Component Value Date   ALT 11 06/30/2023   AST 16  06/30/2023   ALKPHOS 61 06/30/2023   BILITOT 1.2 06/30/2023     Microbiology: Recent Results (from the past 240 hours)  Blood Culture (routine x 2)     Status: None (Preliminary result)   Collection Time: 06/27/23  2:16 AM   Specimen: BLOOD  Result Value Ref Range Status   Specimen Description BLOOD SITE NOT SPECIFIED  Final   Special Requests   Final    BOTTLES DRAWN AEROBIC AND ANAEROBIC Blood Culture adequate volume   Culture   Final    NO GROWTH 3 DAYS Performed at [facility], [address], [place], [place] [address]    Report Status PENDING  Incomplete  Blood Culture (routine x 2)     Status: None (Preliminary result)   Collection Time: 06/27/23  2:25 AM   Specimen: BLOOD  Result Value Ref Range Status   Specimen Description BLOOD SITE NOT SPECIFIED  Final   Special Requests   Final    BOTTLES DRAWN AEROBIC AND ANAEROBIC  Blood Culture adequate volume   Culture   Final    NO GROWTH 3 DAYS Performed at [facility], [address], [place], [place] [address]    Report Status PENDING  Incomplete  Aerobic Culture w Gram Stain (superficial specimen)     Status: None   Collection Time: 06/27/23  3:27 AM   Specimen: Leg  Result Value Ref Range Status   Specimen Description LEG RIGHT  Final   Special Requests NONE  Final   Gram Stain NO WBC SEEN RARE GRAM POSITIVE COCCI IN SINGLES   Final   Culture   Final    MODERATE METHICILLIN RESISTANT STAPHYLOCOCCUS AUREUS FEW STREPTOCOCCUS GROUP G Beta hemolytic streptococci are predictably susceptible to penicillin  and other beta lactams. Susceptibility testing not routinely performed. Performed at [facility], [address], [place], [place] [address]    Report Status 06/29/2023 FINAL  Final   Organism ID, Bacteria METHICILLIN RESISTANT STAPHYLOCOCCUS AUREUS  Final      Susceptibility   Methicillin resistant staphylococcus aureus - MIC*    CIPROFLOXACIN >=8 RESISTANT Resistant     ERYTHROMYCIN >=8  RESISTANT Resistant     GENTAMICIN  <=0.5 SENSITIVE Sensitive     OXACILLIN >=4 RESISTANT Resistant     TETRACYCLINE <=1 SENSITIVE Sensitive     VANCOMYCIN  1 SENSITIVE Sensitive     TRIMETH/SULFA >=320 RESISTANT Resistant     CLINDAMYCIN  <=0.25 SENSITIVE Sensitive     RIFAMPIN <=0.5 SENSITIVE Sensitive     Inducible Clindamycin  NEGATIVE Sensitive     LINEZOLID  2 SENSITIVE Sensitive     * MODERATE METHICILLIN RESISTANT STAPHYLOCOCCUS AUREUS  Surgical PCR screen     Status: Abnormal   Collection Time: 06/27/23  4:03 PM   Specimen: Nasal Mucosa; Nasal Swab  Result Value Ref Range Status   MRSA, PCR POSITIVE (A) NEGATIVE Final    Comment: RESULT CALLED TO, READ BACK BY AND VERIFIED WITH: RN CLINT WOOTEN ON 06/27/23 @ 1818 BY DRT    Staphylococcus aureus POSITIVE (A) NEGATIVE Final    Comment: (NOTE) The Xpert SA Assay (FDA approved for NASAL specimens in patients 76 years of age and older), is one component of a comprehensive surveillance program. It is not intended to diagnose infection nor to guide or monitor treatment. Performed at [facility], [address], [place], [place] [address]   Aerobic/Anaerobic Culture w Gram Stain (surgical/deep wound)     Status: None (Preliminary result)   Collection Time: 06/29/23 10:39 AM   Specimen: Leg, Right; Abscess  Result Value Ref Range Status   Specimen Description TISSUE  Final   Special Requests A SOFT TIS ABSCESS  Final   Gram Stain   Final    ABUNDANT WBC PRESENT, PREDOMINANTLY PMN NO ORGANISMS SEEN    Culture   Final    CULTURE REINCUBATED FOR BETTER GROWTH Performed at [facility], [address], [place], [place] [address]    Report Status PENDING  Incomplete  Aerobic/Anaerobic Culture w Gram Stain (surgical/deep wound)     Status: None (Preliminary result)   Collection Time: 06/29/23 10:41 AM   Specimen: Leg, Right; Abscess  Result Value Ref Range Status   Specimen Description ABSCESS  Final   Special  Requests B  Final   Gram Stain   Final    ABUNDANT WBC PRESENT, PREDOMINANTLY PMN NO ORGANISMS SEEN    Culture   Final    RARE STAPHYLOCOCCUS AUREUS CULTURE REINCUBATED FOR BETTER GROWTH  Performed at [facility], [address], [place], [place] [address]    Report Status PENDING  Incomplete  Aerobic/Anaerobic Culture w Gram Stain (surgical/deep wound)     Status: None (Preliminary result)   Collection Time: 06/29/23 10:41 AM   Specimen: Leg, Right; Abscess  Result Value Ref Range Status   Specimen Description ABSCESS  Final   Special Requests C  Final   Gram Stain   Final    ABUNDANT WBC PRESENT, PREDOMINANTLY PMN NO ORGANISMS SEEN    Culture   Final    NO GROWTH < 24 HOURS Performed at [facility], [address], [place], [place] [address]    Report Status PENDING  Incomplete   I have personally spent 30 minutes involved in face-to-face and non-face-to-face activities for this patient on the day of the visit. Professional time spent includes the following activities: Preparing to see the patient (review of tests), Obtaining and/or reviewing separately obtained history (admission/discharge record), Performing a medically appropriate examination and/or evaluation , Ordering medications/tests/procedures, referring and communicating with other health care professionals, Documenting clinical information in the EMR, Independently interpreting results (not separately reported), Communicating results to the patient/family/caregiver, Counseling and educating the patient/family/caregiver and Care coordination (not separately reported).    Greg Lafaye Mcelmurry, NP [facility]  06/30/2023  9:56 AM

## 2023-07-01 DIAGNOSIS — B9562 Methicillin resistant Staphylococcus aureus infection as the cause of diseases classified elsewhere: Secondary | ICD-10-CM | POA: Diagnosis not present

## 2023-07-01 DIAGNOSIS — T8453XA Infection and inflammatory reaction due to internal right knee prosthesis, initial encounter: Secondary | ICD-10-CM | POA: Diagnosis not present

## 2023-07-01 DIAGNOSIS — T8450XD Infection and inflammatory reaction due to unspecified internal joint prosthesis, subsequent encounter: Secondary | ICD-10-CM | POA: Diagnosis not present

## 2023-07-01 DIAGNOSIS — M86161 Other acute osteomyelitis, right tibia and fibula: Secondary | ICD-10-CM | POA: Diagnosis not present

## 2023-07-01 DIAGNOSIS — M869 Osteomyelitis, unspecified: Secondary | ICD-10-CM | POA: Diagnosis not present

## 2023-07-01 DIAGNOSIS — B954 Other streptococcus as the cause of diseases classified elsewhere: Secondary | ICD-10-CM | POA: Diagnosis not present

## 2023-07-01 LAB — COMPREHENSIVE METABOLIC PANEL WITH GFR
ALT: 13 U/L (ref 0–44)
AST: 24 U/L (ref 15–41)
Albumin: 2.2 g/dL — ABNORMAL LOW (ref 3.5–5.0)
Alkaline Phosphatase: 71 U/L (ref 38–126)
Anion gap: 6 (ref 5–15)
BUN: 20 mg/dL (ref 6–20)
CO2: 25 mmol/L (ref 22–32)
Calcium: 9.4 mg/dL (ref 8.9–10.3)
Chloride: 101 mmol/L (ref 98–111)
Creatinine, Ser: 0.72 mg/dL (ref 0.61–1.24)
GFR, Estimated: >60 mL/min (ref >60)
Glucose, Bld: 134 mg/dL — ABNORMAL HIGH (ref 70–99)
Potassium: 5.2 mmol/L — ABNORMAL HIGH (ref 3.5–5.1)
Sodium: 132 mmol/L — ABNORMAL LOW (ref 135–145)
Total Bilirubin: 1.3 mg/dL — ABNORMAL HIGH (ref 0.0–1.2)
Total Protein: 7.1 g/dL (ref 6.5–8.1)

## 2023-07-01 LAB — CBC WITH DIFFERENTIAL/PLATELET
Abs Immature Granulocytes: 0.03 10*3/uL (ref 0.00–0.07)
Basophils Absolute: 0 10*3/uL (ref 0.0–0.1)
Basophils Relative: 0 %
Eosinophils Absolute: 0 10*3/uL (ref 0.0–0.5)
Eosinophils Relative: 0 %
HCT: 38 % — ABNORMAL LOW (ref 39.0–52.0)
Hemoglobin: 12.4 g/dL — ABNORMAL LOW (ref 13.0–17.0)
Immature Granulocytes: 0 %
Lymphocytes Relative: 8 %
Lymphs Abs: 0.8 10*3/uL (ref 0.7–4.0)
MCH: 28 pg (ref 26.0–34.0)
MCHC: 32.6 g/dL (ref 30.0–36.0)
MCV: 85.8 fL (ref 80.0–100.0)
Monocytes Absolute: 1.2 10*3/uL — ABNORMAL HIGH (ref 0.1–1.0)
Monocytes Relative: 12 %
Neutro Abs: 8.4 10*3/uL — ABNORMAL HIGH (ref 1.7–7.7)
Neutrophils Relative %: 80 %
Platelets: 371 10*3/uL (ref 150–400)
RBC: 4.43 MIL/uL (ref 4.22–5.81)
RDW: 16.1 % — ABNORMAL HIGH (ref 11.5–15.5)
WBC: 10.5 10*3/uL (ref 4.0–10.5)
nRBC: 0 % (ref 0.0–0.2)

## 2023-07-01 LAB — PHOSPHORUS: Phosphorus: 3.3 mg/dL (ref 2.5–4.6)

## 2023-07-01 LAB — MAGNESIUM: Magnesium: 1.9 mg/dL (ref 1.7–2.4)

## 2023-07-01 MED ORDER — SODIUM CHLORIDE 0.9% FLUSH
10.0000 mL | Freq: Two times a day (BID) | INTRAVENOUS | Status: DC
  Administered 2023-07-01 – 2023-07-06 (×5): 10 mL

## 2023-07-01 MED ORDER — SODIUM ZIRCONIUM CYCLOSILICATE 10 G PO PACK
10.0000 g | PACK | Freq: Once | ORAL | Status: AC
  Administered 2023-07-01: 10 g via ORAL
  Filled 2023-07-01: qty 1

## 2023-07-01 MED ORDER — SODIUM CHLORIDE 0.9% FLUSH: 10.0000 mL | INTRAVENOUS | Status: DC | PRN

## 2023-07-01 NOTE — Progress Notes (Signed)
 Occupational Therapy Treatment Patient Details Name: Jonathan Ibarra MRN: [id number] DOB: 1965-09-06 Today's Date: 07/01/2023   History of present illness Pt is a 58 y.o. male admitted 4/27 for c/o of R knee swelling & drainage. Multiple knee surgeries since October d/t infections. Chest x-ray shows cardiomegaly and mild interstitial edema with small right pleural effusion. MRI showed suspicion of septic arthritis & osteomyelitis, degenerative tearing of lateral meniscus.4/30 I&D R knee with placement of antibiotic beads. PMH: cirrhosis, afib, HTN, SVT, polysubstance abuse,  Pt s/p 04/20/23 removal of hardware, I & D of R knee   OT comments  Pt with poor level of participation this am. Discussed with nsg and pt had pulled out IVs & his central line last night in addition to vomiting and peeing on the floor. Attempted mobility this session however pt refused. Pt had pulled off his dressings - drainage noted from knee with blood on sheet. Attempted to mobilize OOB to change sheet but pt refused. Bed pad placed under knee, ABD pad placed over incision, secured with Kerlex and ace wrap. At this time recommend continued inpatient follow up therapy, <3 hours/day. Acute OT to follow.       If plan is discharge home, recommend the following:  A little help with walking and/or transfers;[name];Assistance with cooking/housework   Equipment Recommendations  Other (comment)    Recommendations for Other Services      Precautions / Restrictions Precautions Precautions: Fall Restrictions Weight Bearing Restrictions Per Provider Order: No       Mobility Bed Mobility Overal bed mobility: Needs Assistance             General bed mobility comments: able to roll onto his back and extend his RLE; using LLE to pick up RLE    Transfers                   General transfer comment: declined     Balance                                            ADL either performed or assessed with clinical judgement   ADL                                         General ADL Comments: pt declined    Extremity/Trunk Assessment     Lower Extremity Assessment RLE Deficits / Details: keeping R knee flexed; educated on importance of extending knee for mobility needs        Vision       Perception     Praxis     Communication     Cognition Arousal: Lethargic Behavior During Therapy: Agitated, Impulsive Cognition: No family/caregiver present to determine baseline                               Following commands: Intact        Cueing      Exercises Exercises: Other exercises Other Exercises Other Exercises: worked on gentle terminal knee extension    Shoulder Instructions       General Comments dressing at his ankle; blood on sheet    Pertinent Vitals/ Pain  Pain Assessment Pain Assessment: 0-10 Pain Score: 10-Worst pain ever Pain Location: R knee Pain Descriptors / Indicators: Sharp Pain Intervention(s): Limited activity within patient's tolerance, Patient requesting pain meds-RN notified, Repositioned  Home Living                                          Prior Functioning/Environment              Frequency  Min 2X/week        Progress Toward Goals  OT Goals(current goals can now be found in the care plan section)  Progress towards OT goals: Not progressing toward goals - comment (pt not participating)  Acute Rehab OT Goals Patient Stated Goal: to sleep OT Goal Formulation: With patient Time For Goal Achievement: 07/12/23 Potential to Achieve Goals: Fair ADL Goals Pt Will Perform Lower Body Dressing: with supervision;sit to/from stand Pt Will Transfer to Toilet: with supervision;ambulating;regular height toilet Pt Will Perform Toileting - Clothing Manipulation and hygiene: with supervision;sit to/from stand Pt Will Perform  Tub/Shower Transfer: Tub transfer;Shower transfer;with supervision;ambulating;rolling walker;shower seat  Plan      Co-evaluation                 AM-PAC OT "6 Clicks" Daily Activity     Outcome Measure   Help from another person eating meals?: None Help from another person taking care of personal grooming?: A Little Help from another person toileting, which includes using toliet, bedpan, or urinal?: A Little Help from another person bathing (including washing, rinsing, drying)?: A Little Help from another person to put on and taking off regular upper body clothing?: A Little Help from another person to put on and taking off regular lower body clothing?: A Little 6 Click Score: 19    End of Session    OT Visit Diagnosis: Unsteadiness on feet (R26.81);Other abnormalities of gait and mobility (R26.89);Repeated falls (R29.6);Muscle weakness (generalized) (M62.81)   Activity Tolerance Treatment limited secondary to agitation;Patient limited by pain   Patient Left in bed;with call bell/phone within reach;with bed alarm set   Nurse Communication Other (comment);Patient requests pain meds (pt's participation)        Time: 5784-6962 OT Time Calculation (min): 20 min  Charges: OT General Charges $OT Visit: 1 Visit OT Treatments $Self Care/Home Management : 8-22 mins  Milburn Aliment, OT/L   Acute OT Clinical Specialist Acute Rehabilitation Services Pager [phone] Office [phone]   [facility] 07/01/2023, 9:10 AM

## 2023-07-01 NOTE — Progress Notes (Signed)

## 2023-07-01 NOTE — Progress Notes (Signed)
 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 Continue to monitor surgical cultures for sensitivities. Discussed plan of care to continue current dose of vancomycin . Now with midline and explained importance of maintaining IV access. If IV access is lost again for an extended period of time please contact ID provider on call. Therapeutic drug monitoring of renal function and vancomycin  levels.  Continue contact precautions. Post-operative wound care per Orthopedics. Remaining medical and supportive care per Internal Medicine.   PLAN:  Continue current dose of vancomycin . Monitor surgical cultures for sensitivities of Staphylococcus aureus and blood cultures for bacteremia Therapeutic drug monitoring of renal function and vancomycin  levels. Postoperative wound care per orthopedics. Contact precautions. If IV access is interrupted for an extended period of time please notify ID provider.  Remaining medical and supportive care per internal medicine.  Principal Problem:   Prosthetic joint infection, subsequent encounter Active Problems:   Osteomyelitis (HCC)    aspirin   325 mg Oral Daily   diltiazem   180 mg Oral Daily   docusate sodium   100 mg Oral BID   ketorolac   15 mg Intravenous Q6H   metoprolol  succinate  50 mg Oral Daily   mupirocin  ointment  1 Application Nasal BID   sodium chloride  flush  10-40 mL Intracatheter Q12H   sodium chloride  flush  10-40 mL Intracatheter Q12H    SUBJECTIVE:  Afebrile overnight with no acute events.  Tolerating antibiotics with no adverse side effects.  Continues to have increased levels of pain and requesting additional pain medication.   Allergies  Allergen Reactions   Tylenol  [Acetaminophen ] Other (See Comments)    Impacts liver     Review of Systems: Review of Systems  Constitutional:  Negative for chills, fever and weight loss.  Respiratory:  Negative for cough, shortness of breath and wheezing.   Cardiovascular:  Negative for chest pain and leg swelling.  Gastrointestinal:  Negative for abdominal pain, constipation, diarrhea, nausea and vomiting.  Musculoskeletal:        Right lower extremity pain.   Skin:  Negative for rash.      OBJECTIVE: Vitals:   06/30/23 1643 06/30/23 2017 07/01/23 0409 07/01/23 0855  BP: 134/72 124/76 (!) 159/97 (!) 157/84  Pulse: 67 64 63 97  Resp: 17 19 17 17   Temp:  97.7 F (36.5 C)  97.8 F (36.6 C)  TempSrc:  Oral  Oral  SpO2: 100% 100% 100% 100%  Weight:      Height:       Body mass index is 33.64 kg/m.  Physical Exam Constitutional:      General: He is not in acute distress.    Appearance: He is well-developed.  Cardiovascular:     Rate and Rhythm: Normal rate and regular rhythm.     Heart sounds: Normal heart sounds.  Pulmonary:     Effort: Pulmonary effort is normal.     Breath sounds: Normal breath sounds.  Skin:    General: Skin is warm and dry.  Neurological:     Mental Status: He is alert and oriented to person, place, and time.  Lab Results Lab Results  Component Value Date   WBC 10.5 07/01/2023   HGB 12.4 (L) 07/01/2023   HCT 38.0 (L) 07/01/2023   MCV 85.8 07/01/2023   PLT 371 07/01/2023    Lab Results  Component Value Date   CREATININE 0.72 07/01/2023   BUN 20 07/01/2023   NA 132 (L) 07/01/2023   K 5.2 (H) 07/01/2023   CL 101 07/01/2023   CO2 25 07/01/2023    Lab Results  Component Value Date   ALT 13 07/01/2023   AST 24 07/01/2023   ALKPHOS 71 07/01/2023   BILITOT 1.3 (H) 07/01/2023     Microbiology: Recent Results (from the past 240  hours)  Blood Culture (routine x 2)     Status: None (Preliminary result)   Collection Time: 06/27/23  2:16 AM   Specimen: BLOOD  Result Value Ref Range Status   Specimen Description BLOOD SITE NOT SPECIFIED  Final   Special Requests   Final    BOTTLES DRAWN AEROBIC AND ANAEROBIC Blood Culture adequate volume   Culture   Final    NO GROWTH 4 DAYS Performed at [facility], [address], [place], [place] [address]    Report Status PENDING  Incomplete  Blood Culture (routine x 2)     Status: None (Preliminary result)   Collection Time: 06/27/23  2:25 AM   Specimen: BLOOD  Result Value Ref Range Status   Specimen Description BLOOD SITE NOT SPECIFIED  Final   Special Requests   Final    BOTTLES DRAWN AEROBIC AND ANAEROBIC Blood Culture adequate volume   Culture   Final    NO GROWTH 4 DAYS Performed at [facility], [address], [place], [place] [address]    Report Status PENDING  Incomplete  Aerobic Culture w Gram Stain (superficial specimen)     Status: None   Collection Time: 06/27/23  3:27 AM   Specimen: Leg  Result Value Ref Range Status   Specimen Description LEG RIGHT  Final   Special Requests NONE  Final   Gram Stain NO WBC SEEN RARE GRAM POSITIVE COCCI IN SINGLES   Final   Culture   Final    MODERATE METHICILLIN RESISTANT STAPHYLOCOCCUS AUREUS FEW STREPTOCOCCUS GROUP G Beta hemolytic streptococci are predictably susceptible to penicillin  and other beta lactams. Susceptibility testing not routinely performed. Performed at [facility], [address], [place], [place] [address]    Report Status 06/29/2023 FINAL  Final   Organism ID, Bacteria METHICILLIN RESISTANT STAPHYLOCOCCUS AUREUS  Final      Susceptibility   Methicillin resistant staphylococcus aureus - MIC*    CIPROFLOXACIN >=8 RESISTANT Resistant     ERYTHROMYCIN >=8 RESISTANT Resistant     GENTAMICIN  <=0.5 SENSITIVE Sensitive     OXACILLIN >=4 RESISTANT Resistant     TETRACYCLINE <=1  SENSITIVE Sensitive     VANCOMYCIN  1 SENSITIVE Sensitive     TRIMETH/SULFA >=320 RESISTANT Resistant     CLINDAMYCIN  <=0.25 SENSITIVE Sensitive     RIFAMPIN <=0.5 SENSITIVE Sensitive     Inducible Clindamycin  NEGATIVE Sensitive     LINEZOLID  2 SENSITIVE Sensitive     * MODERATE METHICILLIN RESISTANT STAPHYLOCOCCUS AUREUS  Surgical PCR screen     Status: Abnormal   Collection Time: 06/27/23  4:03 PM   Specimen: Nasal Mucosa; Nasal Swab  Result Value Ref Range Status   MRSA, PCR POSITIVE (A) NEGATIVE Final    Comment: RESULT CALLED TO, READ BACK  BY AND VERIFIED WITH: RN CLINT WOOTEN ON 06/27/23 @ 1818 BY DRT    Staphylococcus aureus POSITIVE (A) NEGATIVE Final    Comment: (NOTE) The Xpert SA Assay (FDA approved for NASAL specimens in patients 29 years of age and older), is one component of a comprehensive surveillance program. It is not intended to diagnose infection nor to guide or monitor treatment. Performed at [facility], [address] [place], [place] [address]   Aerobic/Anaerobic Culture w Gram Stain (surgical/deep wound)     Status: None (Preliminary result)   Collection Time: 06/29/23 10:39 AM   Specimen: Leg, Right; Abscess  Result Value Ref Range Status   Specimen Description TISSUE  Final   Special Requests A SOFT TIS ABSCESS  Final   Gram Stain   Final    ABUNDANT WBC PRESENT, PREDOMINANTLY PMN NO ORGANISMS SEEN Performed at [facility], [address] [place], [place] [address]    Culture   Final    RARE STAPHYLOCOCCUS AUREUS SUSCEPTIBILITIES TO FOLLOW NO ANAEROBES ISOLATED; CULTURE IN PROGRESS FOR 5 DAYS    Report Status PENDING  Incomplete  Aerobic/Anaerobic Culture w Gram Stain (surgical/deep wound)     Status: None (Preliminary result)   Collection Time: 06/29/23 10:41 AM   Specimen: Leg, Right; Abscess  Result Value Ref Range Status   Specimen Description ABSCESS  Final   Special Requests B  Final   Gram Stain   Final    ABUNDANT WBC  PRESENT, PREDOMINANTLY PMN NO ORGANISMS SEEN Performed at [facility], [address], [place], [place] [address]    Culture   Final    RARE STAPHYLOCOCCUS AUREUS SUSCEPTIBILITIES TO FOLLOW NO ANAEROBES ISOLATED; CULTURE IN PROGRESS FOR 5 DAYS    Report Status PENDING  Incomplete  Aerobic/Anaerobic Culture w Gram Stain (surgical/deep wound)     Status: None (Preliminary result)   Collection Time: 06/29/23 10:41 AM   Specimen: Leg, Right; Abscess  Result Value Ref Range Status   Specimen Description ABSCESS  Final   Special Requests C  Final   Gram Stain   Final    ABUNDANT WBC PRESENT, PREDOMINANTLY PMN NO ORGANISMS SEEN    Culture   Final    NO GROWTH 2 DAYS NO ANAEROBES ISOLATED; CULTURE IN PROGRESS FOR 5 DAYS Performed at [facility], [address] [place], [place] [address]    Report Status PENDING  Incomplete     Marlan Silva, NP [facility]  07/01/2023  3:36 PM

## 2023-07-01 NOTE — Plan of Care (Signed)

## 2023-07-01 NOTE — Progress Notes (Signed)
 PROGRESS NOTE    Jonathan Ibarra  ZOX:[id number] DOB: August 08, 1965 DOA: 06/26/2023 PCP: Sharry Deem, MD   Brief Narrative:  Jonathan Ibarra is a 58 y.o. male with past medical history significant for tibial plateau fracture, right knee septic arthritis with hardware removal, I&D, wound VAC placement February 2025, HTN, SVT, paroxysmal atrial fibrillation not on anticoagulation, cirrhosis s/p TIPS, chronic thrombocytopenia, anemia of chronic medical disease, polysubstance abuse who presented to [facility] ED on 06/26/2023 with right knee pain, swelling, drainage. He reported he was in his usual state of health until this past week when he noticed increased pain, swelling, erythema and puslike drainage from his right knee.    Imaging was done and MR right knee without contrast showed enlarging complex knee joint effusion with diffuse periarticular marrow edema suspicious for septic arthritis and osteomyelitis, probable associated intraosseous abscess proximal tibia with fistulous tract communicating with a lateral soft tissue component, extensive periarticular soft tissue edema surrounding the knee.  Blood cultures x 2 obtained.  Patient was started on a Cardizem  drip for A-fib with RVR.  Orthopedics consulted and ID consulted. 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  He underwent I&D and is POD day 2 with I&D. PT/OT recommending SNF. ID recommending 4 weeks of IV Vancomycin  (in hospital vs SNF) and then Oritavancin  to complete course w/ then oral Abx for suppression.   Assessment and Plan:   Right knee septic arthritis with abscess with Concern for osteomyelitis: Patient presenting with 1 week history of progressive right knee swelling, erythema, pain with purulent discharge.  Noncompliant with outpatient therapy including IV antibiotics and follow-up with infectious disease.  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  Patient is afebrile without leukocytosis.  MR right knee with findings consistent with acute septic arthritis with concern for osteomyelitis and abscess formation.  Seen by orthopedics, Dr. Curtiss Dowdy who initially recommended AKA, but patient declined with planned I&D on 4/30. CRP was 8.7. Orthopedics ([facility]) following and consulted ID. Blood cultures x 2: No growth x 2 days;  Superficial leg culture: + MRSA; C/w  IV Vancomycin  @ current dose, pharmacy consulted for dosing/monitoring. Pain control w/ Oxycodone  15 mg p.o. every 4 hours as needed severe pain, Robaxin  750 mg p.o. every 8 hours as needed muscle spasms, and Hydromorphone  1 mg IV every 4 hours as needed severe pain not relieved with oral medication. S/p Excision of intraosseous abscess R Proximal Tibia and I&D of R Leg Abscess and Placement of Abx Beads Intraosseously.  Operative cultures are growing 1 out of 3 Staph aureus.  ID recommends continuing dose of vancomycin  for now and monitoring sensitivities and cultures. -Patient Now wants to go to SNF Overnight Removed multiple IV's and now has Midline temporarily. Now awake and alert. Per ID will need at least 4 weeks of IV Abx with Transition to Oritavancin  to complete Course of Abx and then oral Abx for Suppression.  Paroxysmal Atrial Fibrillation with RVR  Essential HTN: Patient presenting with elevated heart rate of 153.  Initially placed on Cardizem  drip.  Home medications restarted and was able to be titrated off.  Not on anticoagulation outpatient. C/w Diltiazem  180 mg p.o. daily and Metoprolol  Succinate 50 mg p.o. daily. HR remains well-controlled now, discontinued telemetry   Cirrhosis s/p TIPS: C/w 2 gram Low-salt diet. Will need  Outpatient follow-up with GI. Bilirubin was elevated at  1.5 on last check but LFTs normal. CTM and Trend and watch for decompensation. Checked PT-INR and was 16.0-1.3 respectively.     Anemia of chronic medical disease: Hgb/Hct went from 13.0/40.9 -> 10.9/33.1 -> 11.2/33.5 -> 12.4/38.0. Check Anemia Panel in the AM. CTM for S/Sx of Bleeding; No overt bleeding noted. Repeat CBC in the AM   Hyponatremia: Mild. Na+ is 132. CTM and trend and Repeat CMP in the AM  Hyperkalemia: K+ is 5.2. Give 1x dose of 10 grams of Lokelama.   Polysubstance use: UDS positive for amphetamines, opiates, THC.  Counseled on need for complete abstinence/cessation from illicit substances.  Hypoalbuminemia: Patient's Albumin went from 2.4 -> 1.9 -> 2.2. CTM and Trend and repeat CMP in the AM  Class I Obesity: Complicates overall prognosis and care. Estimated body mass index is 33.64 kg/m as calculated from the following:   Height as of this encounter: 6\' 1"  (1.854 m).   Weight as of this encounter: 115.7 kg. Weight Loss and Dietary Counseling given   DVT prophylaxis: SCDs Start: 06/29/23 1203    Code Status: Full Code Family Communication: No family present @ bedside  Disposition Plan:  Level of care: Med-Surg Status is: Inpatient Remains inpatient appropriate because: Will need prolonged IV Abx and SNF   Consultants:  Orthopedic Surgery ID  Procedures:  Procedures: CPT 27360-Excision of intraosseous abscess right proximal tibia CPT 27603-Incision and drainage of right leg abscess CPT 11981-Placement of antibiotic beads intraosseously  Antimicrobials:  Anti-infectives (From admission, onward)    Start     Dose/Rate Route Frequency Ordered Stop   06/29/23 2200  vancomycin  (VANCOCIN ) IVPB 1000 mg/200 mL premix        1,000 mg 200 mL/hr over 60 Minutes Intravenous Every 12 hours 06/29/23 2010     06/29/23 1038  gentamicin  (GARAMYCIN ) injection  Status:  Discontinued          As needed 06/29/23 1038 06/29/23 1108   06/29/23 1029  tobramycin  (NEBCIN ) powder  Status:  Discontinued          As needed 06/29/23 1029 06/29/23 1108   06/29/23 1029  vancomycin  (VANCOCIN ) powder  Status:   Discontinued          As needed 06/29/23 1030 06/29/23 1108   06/29/23 0845  ceFAZolin  (ANCEF ) IVPB 2g/100 mL premix        2 g 200 mL/hr over 30 Minutes Intravenous On call to O.R. 06/29/23 0837 06/29/23 1018   06/29/23 0805  ceFAZolin  (ANCEF ) 2-4 GM/100ML-% IVPB       Note to Pharmacy: Emeterio Hansen, GRETA: cabinet override      06/29/23 0805 06/29/23 1019   06/27/23 1800  vancomycin  (VANCOREADY) IVPB 1500 mg/300 mL  Status:  Discontinued        1,500 mg 150 mL/hr over 120 Minutes Intravenous Every 12 hours 06/27/23 1429 06/29/23 2010   06/27/23 0315  cefTRIAXone  (ROCEPHIN ) 2 g in sodium chloride  0.9 % 100 mL IVPB        2 g 200 mL/hr over 30 Minutes Intravenous Once 06/27/23 0313 06/27/23 0512   06/27/23 0315  vancomycin  (VANCOCIN ) IVPB 1000 mg/200 mL premix  Status:  Discontinued        1,000 mg 200 mL/hr over 60 Minutes Intravenous  Once 06/27/23 0313 06/27/23 0314   06/27/23 0315  vancomycin  (VANCOREADY) IVPB 2000 mg/400 mL        2,000 mg 200 mL/hr over 120 Minutes Intravenous  Once 06/27/23 0314 06/27/23 0701  Subjective: Seen and examined at bedside and more awake and alert. Complaining of Leg pain. No CP or SOB. Denied any lightheadedness. No other concerns or complaints at this time.   Objective: Vitals:   06/30/23 2017 07/01/23 0409 07/01/23 0855 07/01/23 1548  BP: 124/76 (!) 159/97 (!) 157/84 125/80  Pulse: 64 63 97 (!) 56  Resp: 19 17 17 16   Temp: 97.7 F (36.5 C)  97.8 F (36.6 C) (!) 97.5 F (36.4 C)  TempSrc: Oral  Oral Oral  SpO2: 100% 100% 100% 100%  Weight:      Height:        Intake/Output Summary (Last 24 hours) at 07/01/2023 1904 Last data filed at 07/01/2023 1530 Gross per 24 hour  Intake --  Output 300 ml  Net -300 ml   Filed Weights   06/26/23 2340  Weight: 115.7 kg   Examination: Physical Exam:  Constitutional: WN/WD obese Caucasian chronically ill-appearing male who appears more awake and a little uncomfortable Respiratory: Diminished to  auscultation bilaterally, no wheezing, rales, rhonchi or crackles. Normal respiratory effort and patient is not tachypenic. No accessory muscle use. Unlabored breathing.  Cardiovascular: RRR, no murmurs / rubs / gallops. S1 and S2 auscultated.  Abdomen: Soft, non-tender, distended 2/2 body habitus. Bowel sounds positive.  GU: Deferred. Musculoskeletal: No clubbing / cyanosis of digits/nails. No joint deformity upper and lower extremities.  Skin: Right Leg wrapped and has multiple tattoos diffusely throughout his body Neurologic: CN 2-12 grossly intact with no focal deficits. Romberg sign and cerebellar reflexes not assessed.  Psychiatric: Normal judgment and insight. Alert and oriented x 3.  Data Reviewed: I have personally reviewed following labs and imaging studies  CBC: Recent Labs  Lab 06/27/23 0219 06/29/23 0655 06/30/23 0707 07/01/23 1241  WBC 6.9 6.2 7.9 10.5  NEUTROABS 4.8  --  7.1 8.4*  HGB 13.0 10.9* 11.2* 12.4*  HCT 40.9 33.1* 33.5* 38.0*  MCV 89.5 87.3 84.4 85.8  PLT 298 267 293 371   Basic Metabolic Panel: Recent Labs  Lab 06/27/23 0219 06/29/23 0651 06/29/23 0655 06/30/23 0707 07/01/23 1241  NA 138 132* 131* 131* 132*  K 3.1* 4.7 4.6 4.6 5.2*  CL 104 100 100 101 101  CO2 24 26 25 22 25   GLUCOSE 56* 103* 109* 130* 134*  BUN 9 13 13 17 20   CREATININE 0.71 0.80 0.80 0.55* 0.72  CALCIUM  8.8* 8.4* 8.3* 8.8* 9.4  MG  --   --  1.8 1.7 1.9  PHOS  --  2.8  --  3.0 3.3   GFR: Estimated Creatinine Clearance: 137.4 mL/min (by C-G formula based on SCr of 0.72 mg/dL). Liver Function Tests: Recent Labs  Lab 06/27/23 0219 06/29/23 0651 06/30/23 0707 07/01/23 1241  AST 24 16 16 24   ALT 12 9 11 13   ALKPHOS 100 76 61 71  BILITOT 1.2 1.5* 1.2 1.3*  PROT 7.8 6.4* 6.6 7.1  ALBUMIN 2.4* 1.9* 1.9* 2.2*   No results for input(s): "LIPASE", "AMYLASE" in the last 168 hours. Recent Labs  Lab 06/30/23 0707  AMMONIA 55*   Coagulation Profile: Recent Labs  Lab  06/27/23 0219 06/30/23 0707  INR 1.2 1.3*   Cardiac Enzymes: No results for input(s): "CKTOTAL", "CKMB", "CKMBINDEX", "TROPONINI" in the last 168 hours. BNP (last 3 results) No results for input(s): "PROBNP" in the last 8760 hours. HbA1C: No results for input(s): "HGBA1C" in the last 72 hours. CBG: Recent Labs  Lab 06/27/23 0607  GLUCAP 82   Lipid  Profile: No results for input(s): "CHOL", "HDL", "LDLCALC", "TRIG", "CHOLHDL", "LDLDIRECT" in the last 72 hours. Thyroid Function Tests: No results for input(s): "TSH", "T4TOTAL", "FREET4", "T3FREE", "THYROIDAB" in the last 72 hours. Anemia Panel: No results for input(s): "VITAMINB12", "FOLATE", "FERRITIN", "TIBC", "IRON", "RETICCTPCT" in the last 72 hours. Sepsis Labs: Recent Labs  Lab 06/27/23 0230 06/27/23 0439  LATICACIDVEN 1.8 1.0   Recent Results (from the past 240 hours)  Blood Culture (routine x 2)     Status: None (Preliminary result)   Collection Time: 06/27/23  2:16 AM   Specimen: BLOOD  Result Value Ref Range Status   Specimen Description BLOOD SITE NOT SPECIFIED  Final   Special Requests   Final    BOTTLES DRAWN AEROBIC AND ANAEROBIC Blood Culture adequate volume   Culture   Final    NO GROWTH 4 DAYS Performed at [facility], [address] [place], [place] [address]    Report Status PENDING  Incomplete  Blood Culture (routine x 2)     Status: None (Preliminary result)   Collection Time: 06/27/23  2:25 AM   Specimen: BLOOD  Result Value Ref Range Status   Specimen Description BLOOD SITE NOT SPECIFIED  Final   Special Requests   Final    BOTTLES DRAWN AEROBIC AND ANAEROBIC Blood Culture adequate volume   Culture   Final    NO GROWTH 4 DAYS Performed at [facility], [address], [place], [place] [address]    Report Status PENDING  Incomplete  Aerobic Culture w Gram Stain (superficial specimen)     Status: None   Collection Time: 06/27/23  3:27 AM   Specimen: Leg  Result Value Ref Range  Status   Specimen Description LEG RIGHT  Final   Special Requests NONE  Final   Gram Stain NO WBC SEEN RARE GRAM POSITIVE COCCI IN SINGLES   Final   Culture   Final    MODERATE METHICILLIN RESISTANT STAPHYLOCOCCUS AUREUS FEW STREPTOCOCCUS GROUP G Beta hemolytic streptococci are predictably susceptible to penicillin  and other beta lactams. Susceptibility testing not routinely performed. Performed at [facility], [address], [place], [place] [address]    Report Status 06/29/2023 FINAL  Final   Organism ID, Bacteria METHICILLIN RESISTANT STAPHYLOCOCCUS AUREUS  Final      Susceptibility   Methicillin resistant staphylococcus aureus - MIC*    CIPROFLOXACIN >=8 RESISTANT Resistant     ERYTHROMYCIN >=8 RESISTANT Resistant     GENTAMICIN  <=0.5 SENSITIVE Sensitive     OXACILLIN >=4 RESISTANT Resistant     TETRACYCLINE <=1 SENSITIVE Sensitive     VANCOMYCIN  1 SENSITIVE Sensitive     TRIMETH/SULFA >=320 RESISTANT Resistant     CLINDAMYCIN  <=0.25 SENSITIVE Sensitive     RIFAMPIN <=0.5 SENSITIVE Sensitive     Inducible Clindamycin  NEGATIVE Sensitive     LINEZOLID  2 SENSITIVE Sensitive     * MODERATE METHICILLIN RESISTANT STAPHYLOCOCCUS AUREUS  Surgical PCR screen     Status: Abnormal   Collection Time: 06/27/23  4:03 PM   Specimen: Nasal Mucosa; Nasal Swab  Result Value Ref Range Status   MRSA, PCR POSITIVE (A) NEGATIVE Final    Comment: RESULT CALLED TO, READ BACK BY AND VERIFIED WITH: RN CLINT WOOTEN ON 06/27/23 @ 1818 BY DRT    Staphylococcus aureus POSITIVE (A) NEGATIVE Final    Comment: (NOTE) The Xpert SA Assay (FDA approved for NASAL specimens in patients 63 years of age and older), is one  component of a comprehensive surveillance program. It is not intended to diagnose infection nor to guide or monitor treatment. Performed at [facility], [address], [place], [place] [address]   Aerobic/Anaerobic Culture w Gram Stain (surgical/deep wound)     Status: None  (Preliminary result)   Collection Time: 06/29/23 10:39 AM   Specimen: Leg, Right; Abscess  Result Value Ref Range Status   Specimen Description TISSUE  Final   Special Requests A SOFT TIS ABSCESS  Final   Gram Stain   Final    ABUNDANT WBC PRESENT, PREDOMINANTLY PMN NO ORGANISMS SEEN Performed at [facility], [address] [place], [place] [address]    Culture   Final    RARE STAPHYLOCOCCUS AUREUS SUSCEPTIBILITIES TO FOLLOW NO ANAEROBES ISOLATED; CULTURE IN PROGRESS FOR 5 DAYS    Report Status PENDING  Incomplete  Aerobic/Anaerobic Culture w Gram Stain (surgical/deep wound)     Status: None (Preliminary result)   Collection Time: 06/29/23 10:41 AM   Specimen: Leg, Right; Abscess  Result Value Ref Range Status   Specimen Description ABSCESS  Final   Special Requests B  Final   Gram Stain   Final    ABUNDANT WBC PRESENT, PREDOMINANTLY PMN NO ORGANISMS SEEN Performed at [facility], [address] [place], [place] [address]    Culture   Final    RARE STAPHYLOCOCCUS AUREUS SUSCEPTIBILITIES TO FOLLOW NO ANAEROBES ISOLATED; CULTURE IN PROGRESS FOR 5 DAYS    Report Status PENDING  Incomplete  Aerobic/Anaerobic Culture w Gram Stain (surgical/deep wound)     Status: None (Preliminary result)   Collection Time: 06/29/23 10:41 AM   Specimen: Leg, Right; Abscess  Result Value Ref Range Status   Specimen Description ABSCESS  Final   Special Requests C  Final   Gram Stain   Final    ABUNDANT WBC PRESENT, PREDOMINANTLY PMN NO ORGANISMS SEEN    Culture   Final    NO GROWTH 2 DAYS NO ANAEROBES ISOLATED; CULTURE IN PROGRESS FOR 5 DAYS Performed at [facility], [address] [place], [place] [address]    Report Status PENDING  Incomplete    Radiology Studies: No results found.  Scheduled Meds:  aspirin   325 mg Oral Daily   diltiazem   180 mg Oral Daily   docusate sodium   100 mg Oral BID   ketorolac   15 mg Intravenous Q6H   metoprolol  succinate  50 mg  Oral Daily   mupirocin  ointment  1 Application Nasal BID   sodium chloride  flush  10-40 mL Intracatheter Q12H   sodium chloride  flush  10-40 mL Intracatheter Q12H   sodium zirconium cyclosilicate   10 g Oral Once   Continuous Infusions:  vancomycin  1,000 mg (07/01/23 1753)    LOS: 4 days   Aura Leeds, DO [facility] Available via Epic secure chat 7am-7pm After these hours, please refer to coverage provider listed on [email or web address] 07/01/2023, 7:04 PM

## 2023-07-01 NOTE — Progress Notes (Signed)
 Physical Therapy Treatment Patient Details Name: Jonathan Ibarra MRN: [id number] DOB: [birth year] Today's Date: 07/01/2023   History of Present Illness Pt is a 58 y.o. male admitted 4/27 for c/o of R knee swelling & drainage. Multiple knee surgeries since October d/t infections. MRI showed suspicion of septic arthritis & osteomyelitis, degenerative tearing of lateral meniscus. I&D of R knee on 06/29/2023. PMH: cirrhosis, afib, HTN, SVT, polysubstance abuse,  Pt s/p 04/20/23 removal of hardware, I & D of R knee    PT Comments  Pt is limited by pain at R knee but does participate in bed mobility and transfer training. PT notes incomplete knee extension at R knee, even in standing. The pt is encouraged to maintain knee extension when resting in an effort to prevent a flexion contracture from forming. Pt declines ambulation at this time but will benefit from early ambulation and progressive weightbearing as wiling/able. Patient will benefit from continued inpatient follow up therapy, <3 hours/day.    If plan is discharge home, [name];[name];Assist for transportation;Help with stairs or ramp for entrance   Can travel by private vehicle     Yes  Equipment Recommendations  Rolling walker (2 wheels)    Recommendations for Other Services       Precautions / Restrictions Precautions Precautions: Fall Recall of Precautions/Restrictions: Intact Restrictions Weight Bearing Restrictions Per Provider Order: Yes RLE Weight Bearing Per Provider Order: Weight bearing as tolerated     Mobility  Bed Mobility Overal bed mobility: Needs Assistance Bed Mobility: Supine to Sit     Supine to sit: Supervision, [name]          Transfers Overall transfer level: Needs assistance Equipment used: Rolling walker (2 wheels) Transfers: Sit to/from Stand, Bed to chair/wheelchair/BSC Sit to Stand: Min  assist   Step pivot transfers: Contact guard assist            Ambulation/Gait Ambulation/Gait assistance:  (pt declines ambulation due to pain)                 Stairs             Wheelchair Mobility     Tilt Bed    Modified Rankin (Stroke Patients Only)       Balance Overall balance assessment: Needs assistance Sitting-balance support: No upper extremity supported, [name], Reliant on assistive device for balance Standing balance-Leahy Scale: Poor                              Communication Communication Communication: No apparent difficulties  Cognition Arousal: Alert Behavior During Therapy: WFL for tasks assessed/performed   PT - Cognitive impairments: No apparent impairments                         Following commands: Intact      Cueing Cueing Techniques: Verbal cues  Exercises Other Exercises Other Exercises: pt encouraged to attempt to maintain knee extension when resting, pillow placed under LLE in recliner to aide in passive extension    General Comments General comments (skin integrity, edema, etc.): VSS on RA      Pertinent Vitals/Pain Pain Assessment Pain Assessment: Faces Faces Pain Scale: Hurts whole lot Pain Location: R knee Pain Descriptors / Indicators: Grimacing Pain Intervention(s):  Monitored during session    Home Living                          Prior Function            PT Goals (current goals can now be found in the care plan section) Acute Rehab PT Goals Patient Stated Goal: get better Progress towards PT goals: Not progressing toward goals - comment (pain limiting)    Frequency    Min 2X/week      PT Plan      Co-evaluation              AM-PAC PT "6 Clicks" Mobility   Outcome Measure  Help needed turning from your back to your side while in a flat  bed without using bedrails?: None Help needed moving from lying on your back to sitting on the side of a flat bed without using bedrails?: A Little Help needed moving to and from a bed to a chair (including a wheelchair)?: A Little Help needed standing up from a chair using your arms (e.g., wheelchair or bedside chair)?: A Little Help needed to walk in hospital room?: Total Help needed climbing 3-5 steps with a railing? : Total 6 Click Score: 15    End of Session Equipment Utilized During Treatment: Gait belt Activity Tolerance: Patient limited by pain Patient left: in chair;with call bell/phone within reach;with chair alarm set Nurse Communication: Mobility status PT Visit Diagnosis: Unsteadiness on feet (R26.81);Pain Pain - Right/Left: Right Pain - part of body: Knee     Time: 6295-2841 PT Time Calculation (min) (ACUTE ONLY): 21 min  Charges:    $Therapeutic Activity: 8-22 mins PT General Charges $$ ACUTE PT VISIT: 1 Visit                     Rexie Catena, PT, DPT Acute Rehabilitation Office [phone]    Rexie Catena 07/01/2023, 11:06 AM

## 2023-07-01 NOTE — TOC Progression Note (Signed)
 Transition of Care [facility]) - Progression Note    Patient Details  Name: Jonathan Ibarra MRN: [id number] Date of Birth: [birth year]  Transition of [facility] CM/SW Contact  Katrinka Parr, [place] Phone Number: 07/01/2023, 12:25 PM  Clinical Narrative:     CSW met with pt and confirmed he is interested in SNF at DC. CSW provided SNF offer list again. Pt reviewed list and chose [facility] in [place]. TOC to follow to assist with pt. Need for IV abx with substance use history may be a barrier for DC.   CSW called [facility] and left voicemail requesting return call.   Expected Discharge Plan: Skilled Nursing Facility Barriers to Discharge: English as a second language teacher, Continued Medical Work up  Expected Discharge Plan and Services   Discharge Planning Services: CM Consult Post Acute Care Choice:  (await PT eval post op) Living arrangements for the past 2 months: Single Family Home                 DME Arranged:  (await post op eval)         HH Arranged:  (see note)           Social Determinants of Health (SDOH) Interventions SDOH Screenings   Food Insecurity: Food Insecurity Present (06/27/2023)  Housing: High Risk (06/27/2023)  Transportation Needs: Unmet Transportation Needs (06/27/2023)  Utilities: Not At Risk (06/27/2023)  Financial Resource Strain: Medium Risk (01/07/2020)   Received from [facility]  Social Connections: Unknown (07/05/2021)   Received from [facility], [facility]  Stress: No Stress Concern Present (01/07/2020)   Received from [facility], [facility]  Tobacco Use: High Risk (06/29/2023)    Readmission Risk Interventions     No data to display

## 2023-07-01 NOTE — Progress Notes (Signed)
 Pharmacy Antibiotic Note  Jonathan Ibarra is a 58 y.o. male admitted on 06/26/2023 with recurrent right septic knee .  Staph aureus growing in cultures. Pharmacy has been consulted for vancomycin  dosing.  Vancomycin  was adjusted earlier this week due to supratherapeutic AUC. Plan next levels on Monday.   Plan: Continue vancomycin  1g IV q12 AUC 431 Follow up renal function, levels.  Planned for patient to stay inpatient for at least 4 weeks  Will send Dapto MIC on staph aureus   Height: 6\' 1"  (185.4 cm) Weight: 115.7 kg (255 lb) IBW/kg (Calculated) : 79.9  Temp (24hrs), Avg:97.8 F (36.6 C), Min:97.7 F (36.5 C), Max:97.8 F (36.6 C)  Recent Labs  Lab 06/27/23 0219 06/27/23 0230 06/27/23 0439 06/28/23 2128 06/29/23 0651 06/29/23 0655 06/29/23 1805 06/30/23 0707  WBC 6.9  --   --   --   --  6.2  --  7.9  CREATININE 0.71  --   --   --  0.80 0.80  --  0.55*  LATICACIDVEN  --  1.8 1.0  --   --   --   --   --   VANCOTROUGH  --   --   --   --   --   --  16  --   VANCOPEAK  --   --   --  32  --   --   --   --     Estimated Creatinine Clearance: 137.4 mL/min (A) (by C-G formula based on SCr of 0.55 mg/dL (L)).    Allergies  Allergen Reactions   Tylenol  [Acetaminophen ] Other (See Comments)    Impacts liver    Antimicrobials this admission: 4/28 Vancomycin  >> 4/28 Ceftriaxone  x 1  Dose adjustments this admission:  Microbiology results: 4/28 BCx: ngtd 4/28 R Leg: MRSA 4/30 R leg: staph aureus    Denson Flake, PharmD, BCPS, BCIDP Infectious Diseases Clinical Pharmacist Phone: [phone] 07/01/2023 12:22 PM

## 2023-07-01 NOTE — TOC Progression Note (Signed)
 Transition of Care (TOC) - Progression Note   Spoke to patient at bedside regarding disposition. Patient now wants to go to a SNF for short term rehab at discharge. Patient aware he will need to participate with OT/PT and insurance will need to approve.   Patient Details  Name: Jonathan Ibarra MRN: [id number] Date of Birth: 1965-09-03  Transition of Care [facility]) CM/SW Contact  Miyu Fenderson, [name], RN Phone Number: 07/01/2023, 10:13 AM  Clinical Narrative:       Expected Discharge Plan: Skilled Nursing Facility Barriers to Discharge: Insurance Authorization  Expected Discharge Plan and Services   Discharge Planning Services: CM Consult Post Acute Care Choice:  (await PT eval post op) Living arrangements for the past 2 months: Single Family Home                 DME Arranged:  (await post op eval)         HH Arranged:  (see note)           Social Determinants of Health (SDOH) Interventions SDOH Screenings   Food Insecurity: Food Insecurity Present (06/27/2023)  Housing: High Risk (06/27/2023)  Transportation Needs: Unmet Transportation Needs (06/27/2023)  Utilities: Not At Risk (06/27/2023)  Financial Resource Strain: Medium Risk (01/07/2020)   Received from [facility]  Social Connections: Unknown (07/05/2021)   Received from [facility], [facility]  Stress: No Stress Concern Present (01/07/2020)   Received from [facility], [facility]  Tobacco Use: High Risk (06/29/2023)    Readmission Risk Interventions     No data to display

## 2023-07-01 NOTE — Progress Notes (Addendum)
 Orthopaedic Trauma Progress Note  SUBJECTIVE: Patient lying in bed this morning, lethargic but answering questions. Doesn't want to participate with therapies. 1/3 intraoperative cultures growing Staph aureus. Susceptibilities pending  OBJECTIVE:  Vitals:   07/01/23 0409 07/01/23 0855  BP: (!) 159/97 (!) 157/84  Pulse: 63 97  Resp: 17 17  Temp:  97.8 F (36.6 C)  SpO2: 100% 100%    Opiates Today (MME): Today's  total administered Morphine  Milligram Equivalents: 0 Opiates Yesterday (MME): Yesterday's total administered Morphine  Milligram Equivalents: 22.5  General: Laying in bed.  Awake, lethargic Respiratory: No increased work of breathing.  Operative Extremity (RLE): Dressing stable to the knee. Patient able to move the knee some but notes pain with this.  + DP pulse  IMAGING: Preoperative MRI showed large knee joint effusion with intraosseous abscess   LABS:  No results found for this or any previous visit (from the past 24 hours).   ASSESSMENT: Jonathan Ibarra is a 58 y.o. male, 2 Days Post-Op s/p IRRIGATION AND DEBRIDEMENT RIGHT KNEE  CV/Blood loss: Hemoglobin 11.2 on 06/30/2023, CBC pending this a.m.  Hemodynamically stable  PLAN: Weightbearing: WBAT RLE ROM: Unrestricted ROM Incisional and dressing care: Change dressing as needed Showering: Hold off on getting incision wet until drainage stops.   Orthopedic device(s): None  Pain management:  1. Toradol  15 mg q 6 hours x5 days 2. Robaxin  500 mg q 6 hours PRN 3. Oxycodone  15 mg q 4 hours PRN 4. Dilaudid  1 mg q 4 hours PRN VTE prophylaxis: Aspirin , SCDs ID: Vancomycin  Foley/Lines:  No foley, KVO IVFs Dispo: PT/OT evaluations as able.  Continue to follow intraoperative cultures.  Appreciate assistance from infectious disease and medicine team in the care of this patient.    Follow - up plan: 2 weeks after d/c for wound check and repeat x-rays   Contact information:  Katheryne Pane MD, Alona Jamaica PA-C. After  hours and holidays please check [email or web address] for group call information for [facility]   Edilia Gordon, PA-C [phone] (office) [email or web address]

## 2023-07-02 DIAGNOSIS — T8450XD Infection and inflammatory reaction due to unspecified internal joint prosthesis, subsequent encounter: Secondary | ICD-10-CM | POA: Diagnosis not present

## 2023-07-02 DIAGNOSIS — M869 Osteomyelitis, unspecified: Secondary | ICD-10-CM | POA: Diagnosis not present

## 2023-07-02 LAB — CBC WITH DIFFERENTIAL/PLATELET
Abs Immature Granulocytes: 0.02 10*3/uL (ref 0.00–0.07)
Basophils Absolute: 0 10*3/uL (ref 0.0–0.1)
Basophils Relative: 0 %
Eosinophils Absolute: 0.1 10*3/uL (ref 0.0–0.5)
Eosinophils Relative: 1 %
HCT: 35.5 % — ABNORMAL LOW (ref 39.0–52.0)
Hemoglobin: 11.5 g/dL — ABNORMAL LOW (ref 13.0–17.0)
Immature Granulocytes: 0 %
Lymphocytes Relative: 12 %
Lymphs Abs: 1.1 10*3/uL (ref 0.7–4.0)
MCH: 28 pg (ref 26.0–34.0)
MCHC: 32.4 g/dL (ref 30.0–36.0)
MCV: 86.6 fL (ref 80.0–100.0)
Monocytes Absolute: 1.2 10*3/uL — ABNORMAL HIGH (ref 0.1–1.0)
Monocytes Relative: 14 %
Neutro Abs: 6.3 10*3/uL (ref 1.7–7.7)
Neutrophils Relative %: 73 %
Platelets: 331 10*3/uL (ref 150–400)
RBC: 4.1 MIL/uL — ABNORMAL LOW (ref 4.22–5.81)
RDW: 16.3 % — ABNORMAL HIGH (ref 11.5–15.5)
WBC: 8.7 10*3/uL (ref 4.0–10.5)
nRBC: 0 % (ref 0.0–0.2)

## 2023-07-02 LAB — COMPREHENSIVE METABOLIC PANEL WITH GFR
ALT: 12 U/L (ref 0–44)
AST: 23 U/L (ref 15–41)
Albumin: 2 g/dL — ABNORMAL LOW (ref 3.5–5.0)
Alkaline Phosphatase: 68 U/L (ref 38–126)
Anion gap: 6 (ref 5–15)
BUN: 20 mg/dL (ref 6–20)
CO2: 25 mmol/L (ref 22–32)
Calcium: 8.7 mg/dL — ABNORMAL LOW (ref 8.9–10.3)
Chloride: 101 mmol/L (ref 98–111)
Creatinine, Ser: 0.69 mg/dL (ref 0.61–1.24)
GFR, Estimated: >60 mL/min (ref >60)
Glucose, Bld: 132 mg/dL — ABNORMAL HIGH (ref 70–99)
Potassium: 4.1 mmol/L (ref 3.5–5.1)
Sodium: 132 mmol/L — ABNORMAL LOW (ref 135–145)
Total Bilirubin: 1.2 mg/dL (ref 0.0–1.2)
Total Protein: 6.3 g/dL — ABNORMAL LOW (ref 6.5–8.1)

## 2023-07-02 LAB — CULTURE, BLOOD (ROUTINE X 2)
Culture: NO GROWTH
Culture: NO GROWTH
Special Requests: ADEQUATE
Special Requests: ADEQUATE

## 2023-07-02 LAB — MAGNESIUM: Magnesium: 1.7 mg/dL (ref 1.7–2.4)

## 2023-07-02 LAB — PHOSPHORUS: Phosphorus: 3.1 mg/dL (ref 2.5–4.6)

## 2023-07-02 MED ORDER — MAGNESIUM SULFATE 2 GM/50ML IV SOLN
2.0000 g | Freq: Once | INTRAVENOUS | Status: AC
  Administered 2023-07-02: 2 g via INTRAVENOUS
  Filled 2023-07-02: qty 50

## 2023-07-02 NOTE — Plan of Care (Signed)

## 2023-07-02 NOTE — Plan of Care (Signed)

## 2023-07-02 NOTE — Progress Notes (Signed)
 PROGRESS NOTE    Tristan Fleurant  ZOX:[id number] DOB: Jun 09, 1965 DOA: 06/26/2023 PCP: Sharry Deem, MD   Brief Narrative:  Jonathan Ibarra is a 58 y.o. male with past medical history significant for tibial plateau fracture, right knee septic arthritis with hardware removal, I&D, wound VAC placement February 2025, HTN, SVT, paroxysmal atrial fibrillation not on anticoagulation, cirrhosis s/p TIPS, chronic thrombocytopenia, anemia of chronic medical disease, polysubstance abuse who presented to [facility] ED on 06/26/2023 with right knee pain, swelling, drainage. He reported he was in his usual state of health until this past week when he noticed increased pain, swelling, erythema and puslike drainage from his right knee.    Imaging was done and MR right knee without contrast showed enlarging complex knee joint effusion with diffuse periarticular marrow edema suspicious for septic arthritis and osteomyelitis, probable associated intraosseous abscess proximal tibia with fistulous tract communicating with a lateral soft tissue component, extensive periarticular soft tissue edema surrounding the knee.  Blood cultures x 2 obtained.  Patient was started on a Cardizem  drip for A-fib with RVR.  Orthopedics consulted and ID consulted. 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  He underwent I&D and is POD day 3 with I&D. PT/OT recommending SNF. ID recommending 4 weeks of IV Vancomycin  (in hospital vs SNF) and then Oritavancin  to complete course w/ then oral Abx for suppression.   Assessment and Plan:   Right knee septic arthritis with abscess with Concern for osteomyelitis: Patient presenting with 1 week history of progressive right knee swelling, erythema, pain with purulent discharge.  Noncompliant with outpatient therapy including IV antibiotics and follow-up with infectious disease.  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  Patient is afebrile without leukocytosis.  MR right knee with findings consistent with acute septic arthritis with concern for osteomyelitis and abscess formation.  Seen by orthopedics, Dr. Curtiss Dowdy who initially recommended AKA, but patient declined with planned I&D on 4/30. CRP was 8.7. Orthopedics ([facility]) following and consulted ID. Blood cultures x 2: No growth x 2 days;  Superficial leg culture: + MRSA; C/w  IV Vancomycin  @ current dose, pharmacy consulted for dosing/monitoring. Pain control w/ Oxycodone  15 mg p.o. every 4 hours as needed severe pain, Robaxin  750 mg p.o. every 8 hours as needed muscle spasms, and Hydromorphone  1 mg IV every 4 hours as needed severe pain not relieved with oral medication. S/p Excision of intraosseous abscess R Proximal Tibia and I&D of R Leg Abscess and Placement of Abx Beads Intraosseously.  Operative cultures are growing 1 out of 3 Staph aureus.  -Patient Now wants to go to SNF;  The night before last Removed multiple IV's and now has Midline temporarily. Now awake and alert and oriented. Per ID will need at least 4 weeks of IV Abx with Vancomycin  with Transition to Oritavancin  to complete Course of Abx and then oral Abx for Suppression.  Paroxysmal Atrial Fibrillation with RVR  Essential HTN: Patient presenting with elevated heart rate of 153.  Initially placed on Cardizem  drip.  Home medications restarted and was able to be titrated off.  Not on anticoagulation outpatient. C/w Diltiazem  180 mg p.o. daily and Metoprolol  Succinate 50 mg p.o. daily. HR remains well-controlled now, discontinued telemetry. CTM BP per protocol. Last BP reading was 104/63.   Cirrhosis s/p TIPS: C/w 2 gram Low-salt diet. Will need  Outpatient follow-up with GI.  Bilirubin was elevated at 1.5 on last check but LFTs normal. CTM and Trend and watch for decompensation. PT-INR was 16.0-1.3 respectively on last check     Anemia of Chronic Medical Disease: Hgb/Hct went from 13.0/40.9 -> 10.9/33.1 -> 11.2/33.5 -> 12.4/38.0 -> 11.5/35.5. Check Anemia Panel in the AM. CTM for S/Sx of Bleeding; No overt bleeding noted. Repeat CBC in the AM   Hyponatremia: Mild. Na+ is 132 again. CTM and trend and Repeat CMP in the AM  Hyperkalemia: K+ was 5.2. Give 1x dose of 10 grams of Lokelama yesterday and is now 4.1 today.   Polysubstance use: UDS positive for amphetamines, opiates, THC.  Counseled on need for complete abstinence/cessation from illicit substances.  Hypoalbuminemia: Patient's Albumin went from 2.4 -> 1.9 -> 2.2 -> 2.0. CTM and Trend and repeat CMP in the AM  Class I Obesity: Complicates overall prognosis and care. Estimated body mass index is 33.64 kg/m as calculated from the following:   Height as of this encounter: 6\' 1"  (1.854 m).   Weight as of this encounter: 115.7 kg. Weight Loss and Dietary Counseling given   DVT prophylaxis: SCDs Start: 06/29/23 1203    Code Status: Full Code Family Communication: No family present @ bedside  Disposition Plan:  Level of care: Med-Surg Status is: Inpatient Remains inpatient appropriate because: Will need prolonged IV Abx and SNF    Consultants:  Orthopedic Surgery ID  Procedures:  Procedures: CPT 27360-Excision of intraosseous abscess right proximal tibia CPT 27603-Incision and drainage of right leg abscess CPT 11981-Placement of antibiotic beads intraosseously  Antimicrobials:  Anti-infectives (From admission, onward)    Start     Dose/Rate Route Frequency Ordered Stop   06/29/23 2200  vancomycin  (VANCOCIN ) IVPB 1000 mg/200 mL premix        1,000 mg 200 mL/hr over 60 Minutes Intravenous Every 12 hours 06/29/23 2010     06/29/23 1038  gentamicin  (GARAMYCIN ) injection  Status:  Discontinued          As needed 06/29/23 1038 06/29/23 1108   06/29/23 1029  tobramycin  (NEBCIN ) powder  Status:  Discontinued          As needed 06/29/23 1029 06/29/23  1108   06/29/23 1029  vancomycin  (VANCOCIN ) powder  Status:  Discontinued          As needed 06/29/23 1030 06/29/23 1108   06/29/23 0845  ceFAZolin  (ANCEF ) IVPB 2g/100 mL premix        2 g 200 mL/hr over 30 Minutes Intravenous On call to O.R. 06/29/23 0837 06/29/23 1018   06/29/23 0805  ceFAZolin  (ANCEF ) 2-4 GM/100ML-% IVPB       Note to Pharmacy: Emeterio Hansen, GRETA: cabinet override      06/29/23 0805 06/29/23 1019   06/27/23 1800  vancomycin  (VANCOREADY) IVPB 1500 mg/300 mL  Status:  Discontinued        1,500 mg 150 mL/hr over 120 Minutes Intravenous Every 12 hours 06/27/23 1429 06/29/23 2010   06/27/23 0315  cefTRIAXone  (ROCEPHIN ) 2 g in sodium chloride  0.9 % 100 mL IVPB        2 g 200 mL/hr over 30 Minutes Intravenous Once 06/27/23 0313 06/27/23 0512   06/27/23 0315  vancomycin  (VANCOCIN ) IVPB 1000 mg/200 mL premix  Status:  Discontinued        1,000 mg 200 mL/hr over 60 Minutes Intravenous  Once 06/27/23 0313 06/27/23 0314   06/27/23 0315  vancomycin  (VANCOREADY) IVPB 2000 mg/400 mL  2,000 mg 200 mL/hr over 120 Minutes Intravenous  Once 06/27/23 6295 06/27/23 0701       Subjective: Seen and examined at bedside and states that he was complaining of some pain again in his knee.  No nausea or vomiting.  His dressings were little off and he had some drainage previously.  Also had a little bit of nausea and complained of some mild abdominal discomfort but doing okay.  Objective: Vitals:   07/01/23 2055 07/02/23 0346 07/02/23 0756 07/02/23 1529  BP: 122/66 (!) 130/103 126/71 104/63  Pulse: (!) 58 97 (!) 55 (!) 106  Resp: 17 18 18 18   Temp: 98.2 F (36.8 C) 98.1 F (36.7 C) 98.2 F (36.8 C)   TempSrc: Axillary Oral Oral   SpO2: 100% 98% 100% 100%  Weight:      Height:        Intake/Output Summary (Last 24 hours) at 07/02/2023 1802 Last data filed at 07/02/2023 1700 Gross per 24 hour  Intake 1072 ml  Output 520 ml  Net 552 ml   Filed Weights   06/26/23 2340  Weight:  115.7 kg   Examination: Physical Exam:  Constitutional: WN/WD obese Caucasian chronically ill-appearing male who appears comfortable uncomfortable Respiratory: Diminished to auscultation bilaterally, no wheezing, rales, rhonchi or crackles. Normal respiratory effort and patient is not tachypenic. No accessory muscle use.  Unlabored breathing Cardiovascular: RRR, no murmurs / rubs / gallops. S1 and S2 auscultated. No extremity edema. Abdomen: Soft, non-tender, distended secondary to body habitus. Bowel sounds positive.  GU: Deferred. Musculoskeletal: No gross deformities noted and surgical incision noted on his right knee. Skin: Right leg is partially wrapped and he has multiple tattoos scattered diffusely throughout his body.  Sutures are in place and appears clean dry and intact. Neurologic: CN 2-12 grossly intact with no focal deficits. Romberg sign cerebellar reflexes not assessed.  Psychiatric: Normal judgment and insight. Alert and oriented x 3.   Data Reviewed: I have personally reviewed following labs and imaging studies  CBC: Recent Labs  Lab 06/27/23 0219 06/29/23 0655 06/30/23 0707 07/01/23 1241 07/02/23 0406  WBC 6.9 6.2 7.9 10.5 8.7  NEUTROABS 4.8  --  7.1 8.4* 6.3  HGB 13.0 10.9* 11.2* 12.4* 11.5*  HCT 40.9 33.1* 33.5* 38.0* 35.5*  MCV 89.5 87.3 84.4 85.8 86.6  PLT 298 267 293 371 331   Basic Metabolic Panel: Recent Labs  Lab 06/29/23 0651 06/29/23 0655 06/30/23 0707 07/01/23 1241 07/02/23 0406  NA 132* 131* 131* 132* 132*  K 4.7 4.6 4.6 5.2* 4.1  CL 100 100 101 101 101  CO2 26 25 22 25 25   GLUCOSE 103* 109* 130* 134* 132*  BUN 13 13 17 20 20   CREATININE 0.80 0.80 0.55* 0.72 0.69  CALCIUM  8.4* 8.3* 8.8* 9.4 8.7*  MG  --  1.8 1.7 1.9 1.7  PHOS 2.8  --  3.0 3.3 3.1   GFR: Estimated Creatinine Clearance: 137.4 mL/min (by C-G formula based on SCr of 0.69 mg/dL). Liver Function Tests: Recent Labs  Lab 06/27/23 0219 06/29/23 0651 06/30/23 0707  07/01/23 1241 07/02/23 0406  AST 24 16 16 24 23   ALT 12 9 11 13 12   ALKPHOS 100 76 61 71 68  BILITOT 1.2 1.5* 1.2 1.3* 1.2  PROT 7.8 6.4* 6.6 7.1 6.3*  ALBUMIN 2.4* 1.9* 1.9* 2.2* 2.0*   No results for input(s): "LIPASE", "AMYLASE" in the last 168 hours. Recent Labs  Lab 06/30/23 0707  AMMONIA 55*   Coagulation  Profile: Recent Labs  Lab 06/27/23 0219 06/30/23 0707  INR 1.2 1.3*   Cardiac Enzymes: No results for input(s): "CKTOTAL", "CKMB", "CKMBINDEX", "TROPONINI" in the last 168 hours. BNP (last 3 results) No results for input(s): "PROBNP" in the last 8760 hours. HbA1C: No results for input(s): "HGBA1C" in the last 72 hours. CBG: Recent Labs  Lab 06/27/23 0607  GLUCAP 82   Lipid Profile: No results for input(s): "CHOL", "HDL", "LDLCALC", "TRIG", "CHOLHDL", "LDLDIRECT" in the last 72 hours. Thyroid Function Tests: No results for input(s): "TSH", "T4TOTAL", "FREET4", "T3FREE", "THYROIDAB" in the last 72 hours. Anemia Panel: No results for input(s): "VITAMINB12", "FOLATE", "FERRITIN", "TIBC", "IRON", "RETICCTPCT" in the last 72 hours. Sepsis Labs: Recent Labs  Lab 06/27/23 0230 06/27/23 0439  LATICACIDVEN 1.8 1.0   Recent Results (from the past 240 hours)  Blood Culture (routine x 2)     Status: None   Collection Time: 06/27/23  2:16 AM   Specimen: BLOOD  Result Value Ref Range Status   Specimen Description BLOOD SITE NOT SPECIFIED  Final   Special Requests   Final    BOTTLES DRAWN AEROBIC AND ANAEROBIC Blood Culture adequate volume   Culture   Final    NO GROWTH 5 DAYS Performed at [facility], [address], [place], [place] [address]    Report Status 07/02/2023 FINAL  Final  Blood Culture (routine x 2)     Status: None   Collection Time: 06/27/23  2:25 AM   Specimen: BLOOD  Result Value Ref Range Status   Specimen Description BLOOD SITE NOT SPECIFIED  Final   Special Requests   Final    BOTTLES DRAWN AEROBIC AND ANAEROBIC Blood Culture  adequate volume   Culture   Final    NO GROWTH 5 DAYS Performed at [facility], [address], [place], [place] [address]    Report Status 07/02/2023 FINAL  Final  Aerobic Culture w Gram Stain (superficial specimen)     Status: None   Collection Time: 06/27/23  3:27 AM   Specimen: Leg  Result Value Ref Range Status   Specimen Description LEG RIGHT  Final   Special Requests NONE  Final   Gram Stain NO WBC SEEN RARE GRAM POSITIVE COCCI IN SINGLES   Final   Culture   Final    MODERATE METHICILLIN RESISTANT STAPHYLOCOCCUS AUREUS FEW STREPTOCOCCUS GROUP G Beta hemolytic streptococci are predictably susceptible to penicillin  and other beta lactams. Susceptibility testing not routinely performed. Performed at [facility], [address], [place], [place] [address]    Report Status 06/29/2023 FINAL  Final   Organism ID, Bacteria METHICILLIN RESISTANT STAPHYLOCOCCUS AUREUS  Final      Susceptibility   Methicillin resistant staphylococcus aureus - MIC*    CIPROFLOXACIN >=8 RESISTANT Resistant     ERYTHROMYCIN >=8 RESISTANT Resistant     GENTAMICIN  <=0.5 SENSITIVE Sensitive     OXACILLIN >=4 RESISTANT Resistant     TETRACYCLINE <=1 SENSITIVE Sensitive     VANCOMYCIN  1 SENSITIVE Sensitive     TRIMETH/SULFA >=320 RESISTANT Resistant     CLINDAMYCIN  <=0.25 SENSITIVE Sensitive     RIFAMPIN <=0.5 SENSITIVE Sensitive     Inducible Clindamycin  NEGATIVE Sensitive     LINEZOLID  2 SENSITIVE Sensitive     * MODERATE METHICILLIN RESISTANT STAPHYLOCOCCUS AUREUS  Surgical PCR screen     Status: Abnormal   Collection Time: 06/27/23  4:03 PM   Specimen: Nasal Mucosa; Nasal Swab  Result Value  Ref Range Status   MRSA, PCR POSITIVE (A) NEGATIVE Final    Comment: RESULT CALLED TO, READ BACK BY AND VERIFIED WITH: RN CLINT WOOTEN ON 06/27/23 @ 1818 BY DRT    Staphylococcus aureus POSITIVE (A) NEGATIVE Final    Comment: (NOTE) The Xpert SA Assay (FDA approved for NASAL specimens in  patients 6 years of age and older), is one component of a comprehensive surveillance program. It is not intended to diagnose infection nor to guide or monitor treatment. Performed at [facility], [address] [place], [place] [address]   Aerobic/Anaerobic Culture w Gram Stain (surgical/deep wound)     Status: None (Preliminary result)   Collection Time: 06/29/23 10:39 AM   Specimen: Leg, Right; Abscess  Result Value Ref Range Status   Specimen Description TISSUE  Final   Special Requests A SOFT TIS ABSCESS  Final   Gram Stain   Final    ABUNDANT WBC PRESENT, PREDOMINANTLY PMN NO ORGANISMS SEEN Performed at [facility], [address], [place], [place] [address]    Culture   Final    RARE METHICILLIN RESISTANT STAPHYLOCOCCUS AUREUS NO ANAEROBES ISOLATED; CULTURE IN PROGRESS FOR 5 DAYS    Report Status PENDING  Incomplete   Organism ID, Bacteria METHICILLIN RESISTANT STAPHYLOCOCCUS AUREUS  Final      Susceptibility   Methicillin resistant staphylococcus aureus - MIC*    CIPROFLOXACIN >=8 RESISTANT Resistant     ERYTHROMYCIN >=8 RESISTANT Resistant     GENTAMICIN  <=0.5 SENSITIVE Sensitive     OXACILLIN >=4 RESISTANT Resistant     TETRACYCLINE >=16 RESISTANT Resistant     VANCOMYCIN  1 SENSITIVE Sensitive     TRIMETH/SULFA >=320 RESISTANT Resistant     CLINDAMYCIN  <=0.25 SENSITIVE Sensitive     RIFAMPIN <=0.5 SENSITIVE Sensitive     Inducible Clindamycin  NEGATIVE Sensitive     LINEZOLID  2 SENSITIVE Sensitive     * RARE METHICILLIN RESISTANT STAPHYLOCOCCUS AUREUS  Aerobic/Anaerobic Culture w Gram Stain (surgical/deep wound)     Status: None (Preliminary result)   Collection Time: 06/29/23 10:41 AM   Specimen: Leg, Right; Abscess  Result Value Ref Range Status   Specimen Description ABSCESS  Final   Special Requests B  Final   Gram Stain   Final    ABUNDANT WBC PRESENT, PREDOMINANTLY PMN NO ORGANISMS SEEN Performed at [facility], [address],  [place], [place] [address]    Culture   Final    RARE METHICILLIN RESISTANT STAPHYLOCOCCUS AUREUS NO ANAEROBES ISOLATED; CULTURE IN PROGRESS FOR 5 DAYS    Report Status PENDING  Incomplete   Organism ID, Bacteria METHICILLIN RESISTANT STAPHYLOCOCCUS AUREUS  Final      Susceptibility   Methicillin resistant staphylococcus aureus - MIC*    CIPROFLOXACIN >=8 RESISTANT Resistant     ERYTHROMYCIN >=8 RESISTANT Resistant     GENTAMICIN  <=0.5 SENSITIVE Sensitive     OXACILLIN >=4 RESISTANT Resistant     TETRACYCLINE >=16 RESISTANT Resistant     VANCOMYCIN  1 SENSITIVE Sensitive     TRIMETH/SULFA >=320 RESISTANT Resistant     CLINDAMYCIN  <=0.25 SENSITIVE Sensitive     RIFAMPIN <=0.5 SENSITIVE Sensitive     Inducible Clindamycin  NEGATIVE Sensitive     LINEZOLID  2 SENSITIVE Sensitive     * RARE METHICILLIN RESISTANT STAPHYLOCOCCUS AUREUS  Aerobic/Anaerobic Culture w Gram Stain (surgical/deep wound)     Status: None (Preliminary result)   Collection Time: 06/29/23 10:41 AM   Specimen: Leg, Right;  Abscess  Result Value Ref Range Status   Specimen Description ABSCESS  Final   Special Requests C  Final   Gram Stain   Final    ABUNDANT WBC PRESENT, PREDOMINANTLY PMN NO ORGANISMS SEEN    Culture   Final    NO GROWTH 3 DAYS NO ANAEROBES ISOLATED; CULTURE IN PROGRESS FOR 5 DAYS Performed at [facility], [address], [place], [place] [address]    Report Status PENDING  Incomplete    Radiology Studies: No results found.  Scheduled Meds:  aspirin   325 mg Oral Daily   diltiazem   180 mg Oral Daily   docusate sodium   100 mg Oral BID   ketorolac   15 mg Intravenous Q6H   metoprolol  succinate  50 mg Oral Daily   mupirocin  ointment  1 Application Nasal BID   sodium chloride  flush  10-40 mL Intracatheter Q12H   sodium chloride  flush  10-40 mL Intracatheter Q12H   Continuous Infusions:  vancomycin  1,000 mg (07/02/23 0522)    LOS: 5 days   Aura Leeds, DO [facility] Available  via Epic secure chat 7am-7pm After these hours, please refer to coverage provider listed on [email or web address] 07/02/2023, 6:02 PM

## 2023-07-02 NOTE — Progress Notes (Signed)
 Dressing to right lower extremity changed due to excessive leakage from incision site. Copious amounts of serosanguinous fluid noted to be leaking through the abdominal pad and  messing up the linen. Reinforced with compression wrap

## 2023-07-03 ENCOUNTER — Other Ambulatory Visit: Payer: Self-pay

## 2023-07-03 DIAGNOSIS — M869 Osteomyelitis, unspecified: Secondary | ICD-10-CM | POA: Diagnosis not present

## 2023-07-03 DIAGNOSIS — T8450XD Infection and inflammatory reaction due to unspecified internal joint prosthesis, subsequent encounter: Secondary | ICD-10-CM | POA: Diagnosis not present

## 2023-07-03 LAB — COMPREHENSIVE METABOLIC PANEL WITH GFR
ALT: 14 U/L (ref 0–44)
AST: 22 U/L (ref 15–41)
Albumin: 1.8 g/dL — ABNORMAL LOW (ref 3.5–5.0)
Alkaline Phosphatase: 77 U/L (ref 38–126)
Anion gap: 5 (ref 5–15)
BUN: 17 mg/dL (ref 6–20)
CO2: 27 mmol/L (ref 22–32)
Calcium: 8.4 mg/dL — ABNORMAL LOW (ref 8.9–10.3)
Chloride: 102 mmol/L (ref 98–111)
Creatinine, Ser: 0.7 mg/dL (ref 0.61–1.24)
GFR, Estimated: >60 mL/min (ref >60)
Glucose, Bld: 129 mg/dL — ABNORMAL HIGH (ref 70–99)
Potassium: 4.1 mmol/L (ref 3.5–5.1)
Sodium: 134 mmol/L — ABNORMAL LOW (ref 135–145)
Total Bilirubin: 0.9 mg/dL (ref 0.0–1.2)
Total Protein: 5.8 g/dL — ABNORMAL LOW (ref 6.5–8.1)

## 2023-07-03 LAB — CBC WITH DIFFERENTIAL/PLATELET
Abs Immature Granulocytes: 0.03 10*3/uL (ref 0.00–0.07)
Basophils Absolute: 0.1 10*3/uL (ref 0.0–0.1)
Basophils Relative: 1 %
Eosinophils Absolute: 0.3 10*3/uL (ref 0.0–0.5)
Eosinophils Relative: 3 %
HCT: 34.2 % — ABNORMAL LOW (ref 39.0–52.0)
Hemoglobin: 11.1 g/dL — ABNORMAL LOW (ref 13.0–17.0)
Immature Granulocytes: 0 %
Lymphocytes Relative: 15 %
Lymphs Abs: 1.2 10*3/uL (ref 0.7–4.0)
MCH: 28.5 pg (ref 26.0–34.0)
MCHC: 32.5 g/dL (ref 30.0–36.0)
MCV: 87.9 fL (ref 80.0–100.0)
Monocytes Absolute: 1.3 10*3/uL — ABNORMAL HIGH (ref 0.1–1.0)
Monocytes Relative: 16 %
Neutro Abs: 5.4 10*3/uL (ref 1.7–7.7)
Neutrophils Relative %: 65 %
Platelets: 274 10*3/uL (ref 150–400)
RBC: 3.89 MIL/uL — ABNORMAL LOW (ref 4.22–5.81)
RDW: 16.8 % — ABNORMAL HIGH (ref 11.5–15.5)
WBC: 8.2 10*3/uL (ref 4.0–10.5)
nRBC: 0 % (ref 0.0–0.2)

## 2023-07-03 LAB — PHOSPHORUS: Phosphorus: 3.1 mg/dL (ref 2.5–4.6)

## 2023-07-03 LAB — MAGNESIUM: Magnesium: 1.8 mg/dL (ref 1.7–2.4)

## 2023-07-03 MED ORDER — ORAL CARE MOUTH RINSE: 15.0000 mL | OROMUCOSAL | Status: DC | PRN

## 2023-07-03 NOTE — Plan of Care (Signed)

## 2023-07-03 NOTE — Progress Notes (Signed)
 Secure chat with Dr Gillermo Lack re PICC order.  Pt has midline currently working well.  PICC to be reordered on the 8th.  Pt has hx of pulling out PIV access, so holding off on PICC placement at this time.

## 2023-07-03 NOTE — Plan of Care (Signed)
  Problem: Nutrition: Goal: Adequate nutrition will be maintained Outcome: Progressing   Problem: Elimination: Goal: Will not experience complications related to urinary retention Outcome: Progressing   Problem: Pain Managment: Goal: General experience of comfort will improve and/or be controlled Outcome: Progressing   Problem: Skin Integrity: Goal: Risk for impaired skin integrity will decrease Outcome: Progressing

## 2023-07-03 NOTE — Progress Notes (Signed)
 PROGRESS NOTE    Jonathan Ibarra  NUU:[id number] DOB: 1965-07-12 DOA: 06/26/2023 PCP: Sharry Deem, MD   Brief Narrative:  Jonathan Ibarra is a 58 y.o. male with past medical history significant for tibial plateau fracture, right knee septic arthritis with hardware removal, I&D, wound VAC placement February 2025, HTN, SVT, paroxysmal atrial fibrillation not on anticoagulation, cirrhosis s/p TIPS, chronic thrombocytopenia, anemia of chronic medical disease, polysubstance abuse who presented to [facility] ED on 06/26/2023 with right knee pain, swelling, drainage. He reported he was in his usual state of health until this past week when he noticed increased pain, swelling, erythema and puslike drainage from his right knee.    Imaging was done and MR right knee without contrast showed enlarging complex knee joint effusion with diffuse periarticular marrow edema suspicious for septic arthritis and osteomyelitis, probable associated intraosseous abscess proximal tibia with fistulous tract communicating with a lateral soft tissue component, extensive periarticular soft tissue edema surrounding the knee.  Blood cultures x 2 obtained.  Patient was started on a Cardizem  drip for A-fib with RVR.  Orthopedics consulted and ID consulted. 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  He underwent I&D and is POD day 4 with I&D. PT/OT recommending SNF. ID recommending 4 weeks of IV Vancomycin  (in hospital vs SNF) but likely in the hospital and then Oritavancin  to complete course w/ then with chronic oral Abx for suppression.   Assessment and Plan:   Right knee septic arthritis with abscess with Concern for osteomyelitis: Patient presenting with 1 week history of progressive right knee swelling, erythema, pain with purulent discharge.  Noncompliant with outpatient therapy including IV antibiotics and  follow-up with infectious disease.  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  Patient is afebrile without leukocytosis.  MR right knee with findings consistent with acute septic arthritis with concern for osteomyelitis and abscess formation.  Seen by orthopedics, Dr. Curtiss Dowdy who initially recommended AKA, but patient declined with planned I&D on 4/30. CRP was 8.7. Orthopedics ([facility]) following and consulted ID. Blood cultures x 2: No growth x 2 days;  Superficial leg culture: + MRSA; C/w  IV Vancomycin  @ current dose, pharmacy consulted for dosing/monitoring. Pain control w/ Oxycodone  15 mg p.o. every 4 hours as needed severe pain, Robaxin  750 mg p.o. every 8 hours as needed muscle spasms, and Hydromorphone  1 mg IV every 4 hours as needed severe pain not relieved with oral medication. S/p Excision of intraosseous abscess R Proximal Tibia and I&D of R Leg Abscess and Placement of Abx Beads Intraosseously.  Operative cultures are growing 1 out of 3 Staph aureus.  -Patient Now wants to go to SNF; initially removed multiple IV's and now has Midline temporarily which is working well but will be transition to PICC line on 07/07/2023 once the last day of vancomycin  can be safely infused to his midline. Now awake and alert and oriented. Per ID will need at least 4 weeks of IV Abx with Vancomycin  with Transition to Oritavancin  to complete Course of Abx and then oral Abx for Suppression.  Paroxysmal Atrial Fibrillation with RVR  Essential HTN: Patient presenting with elevated heart rate of 153.  Initially placed on Cardizem  drip.  Home medications restarted and was able to be titrated off.  Not on anticoagulation outpatient. C/w Diltiazem  180 mg p.o. daily and Metoprolol  Succinate 50 mg p.o. daily. HR remains well-controlled  now, discontinued telemetry. CTM BP per protocol. Last BP reading was 108/70.   Cirrhosis s/p TIPS: C/w 2 gram Low-salt diet. Will need  Outpatient  follow-up with GI. Bilirubin was elevated at 1.5 on last check but LFTs normal. CTM and Trend and watch for decompensation. PT-INR was 16.0-1.3 respectively on last check on 5 1   Anemia of Chronic Medical Disease: Hgb/Hct went from 13.0/40.9 -> 10.9/33.1 -> 11.2/33.5 -> 12.4/38.0 -> 11.5/35.5 -> 11.1/34.2. Check Anemia Panel in the AM. CTM for S/Sx of Bleeding; No overt bleeding noted. Repeat CBC in the AM   Hyponatremia: Mild. Na+ is 134 now. CTM and trend and Repeat CMP in the AM  Hyperkalemia: K+ was 5.2 and Given 1x dose of 10 grams of Lokelama; Now 4.1 today.   Polysubstance use: UDS positive for amphetamines, opiates, THC.  Counseled on need for complete abstinence/cessation from illicit substances.  Hypoalbuminemia: Patient's Albumin went from 2.4 -> 1.9 -> 2.2 -> 2.0 -> 1.8. CTM and Trend and repeat CMP in the AM  Class I Obesity: Complicates overall prognosis and care. Estimated body mass index is 33.64 kg/m as calculated from the following:   Height as of this encounter: 6\' 1"  (1.854 m).   Weight as of this encounter: 115.7 kg. Weight Loss and Dietary Counseling given   DVT prophylaxis: SCDs Start: 06/29/23 1203    Code Status: Full Code Family Communication: No family present @ bedside  Disposition Plan:  Level of care: Med-Surg Status is: Inpatient Remains inpatient appropriate because: Needs long-term antibiotics and PT OT recommending SNF.   Consultants:  Orthopedic Surgery ID  Procedures:  Procedures: CPT 27360-Excision of intraosseous abscess right proximal tibia CPT 27603-Incision and drainage of right leg abscess CPT 11981-Placement of antibiotic beads intraosseously   Antimicrobials:  Anti-infectives (From admission, onward)    Start     Dose/Rate Route Frequency Ordered Stop   06/29/23 2200  vancomycin  (VANCOCIN ) IVPB 1000 mg/200 mL premix        1,000 mg 200 mL/hr over 60 Minutes Intravenous Every 12 hours 06/29/23 2010     06/29/23 1038  gentamicin   (GARAMYCIN ) injection  Status:  Discontinued          As needed 06/29/23 1038 06/29/23 1108   06/29/23 1029  tobramycin  (NEBCIN ) powder  Status:  Discontinued          As needed 06/29/23 1029 06/29/23 1108   06/29/23 1029  vancomycin  (VANCOCIN ) powder  Status:  Discontinued          As needed 06/29/23 1030 06/29/23 1108   06/29/23 0845  ceFAZolin  (ANCEF ) IVPB 2g/100 mL premix        2 g 200 mL/hr over 30 Minutes Intravenous On call to O.R. 06/29/23 0837 06/29/23 1018   06/29/23 0805  ceFAZolin  (ANCEF ) 2-4 GM/100ML-% IVPB       Note to Pharmacy: Emeterio Hansen, GRETA: cabinet override      06/29/23 0805 06/29/23 1019   06/27/23 1800  vancomycin  (VANCOREADY) IVPB 1500 mg/300 mL  Status:  Discontinued        1,500 mg 150 mL/hr over 120 Minutes Intravenous Every 12 hours 06/27/23 1429 06/29/23 2010   06/27/23 0315  cefTRIAXone  (ROCEPHIN ) 2 g in sodium chloride  0.9 % 100 mL IVPB        2 g 200 mL/hr over 30 Minutes Intravenous Once 06/27/23 0313 06/27/23 0512   06/27/23 0315  vancomycin  (VANCOCIN ) IVPB 1000 mg/200 mL premix  Status:  Discontinued  1,000 mg 200 mL/hr over 60 Minutes Intravenous  Once 06/27/23 0313 06/27/23 0314   06/27/23 0315  vancomycin  (VANCOREADY) IVPB 2000 mg/400 mL        2,000 mg 200 mL/hr over 120 Minutes Intravenous  Once 06/27/23 0314 06/27/23 0701       Subjective: Seen and examined at bedside and was complaining of pain again.  No nausea or vomiting.  Feels okay otherwise.  Denies any lightheadedness or dizziness.  Objective: Vitals:   07/02/23 2034 07/03/23 0500 07/03/23 0823 07/03/23 1549  BP: 113/71 129/80 (!) 122/92 108/70  Pulse: 69 65 73 60  Resp: 18 18 17 16   Temp: (!) 97.5 F (36.4 C) 98.1 F (36.7 C) 98.1 F (36.7 C) 97.7 F (36.5 C)  TempSrc: Oral Oral Oral Oral  SpO2: 100% 100% 100% 99%  Weight:      Height:        Intake/Output Summary (Last 24 hours) at 07/03/2023 1709 Last data filed at 07/03/2023 0830 Gross per 24 hour  Intake 400 ml   Output 450 ml  Net -50 ml   Filed Weights   06/26/23 2340  Weight: 115.7 kg   Examination: Physical Exam:  Constitutional: WN/WD obese chronically ill-appearing Caucasian male who appears a little uncomfortable Respiratory: Diminished to auscultation bilaterally, no wheezing, rales, rhonchi or crackles. Normal respiratory effort and patient is not tachypenic. No accessory muscle use.  Unlabored breathing Cardiovascular: RRR, no murmurs / rubs / gallops. S1 and S2 auscultated. No extremity edema.   Abdomen: Soft, non-tender, distended secondary to body habitus. Bowel sounds positive.  GU: Deferred. Musculoskeletal: No clubbing / cyanosis of digits/nails. No joint deformity noted in the upper and lower extremities.  Skin: Right knee is wrapped in his multiple tattoos scattered diffusely throughout his body. Neurologic: CN 2-12 grossly intact with no focal deficits. Romberg sign and cerebellar reflexes not assessed.  Psychiatric: Normal judgment and insight. Alert and oriented x 3.   Data Reviewed: I have personally reviewed following labs and imaging studies  CBC: Recent Labs  Lab 06/27/23 0219 06/29/23 0655 06/30/23 0707 07/01/23 1241 07/02/23 0406 07/03/23 0201  WBC 6.9 6.2 7.9 10.5 8.7 8.2  NEUTROABS 4.8  --  7.1 8.4* 6.3 5.4  HGB 13.0 10.9* 11.2* 12.4* 11.5* 11.1*  HCT 40.9 33.1* 33.5* 38.0* 35.5* 34.2*  MCV 89.5 87.3 84.4 85.8 86.6 87.9  PLT 298 267 293 371 331 274   Basic Metabolic Panel: Recent Labs  Lab 06/29/23 0651 06/29/23 0655 06/30/23 0707 07/01/23 1241 07/02/23 0406 07/03/23 0201  NA 132* 131* 131* 132* 132* 134*  K 4.7 4.6 4.6 5.2* 4.1 4.1  CL 100 100 101 101 101 102  CO2 26 25 22 25 25 27   GLUCOSE 103* 109* 130* 134* 132* 129*  BUN 13 13 17 20 20 17   CREATININE 0.80 0.80 0.55* 0.72 0.69 0.70  CALCIUM  8.4* 8.3* 8.8* 9.4 8.7* 8.4*  MG  --  1.8 1.7 1.9 1.7 1.8  PHOS 2.8  --  3.0 3.3 3.1 3.1   GFR: Estimated Creatinine Clearance: 137.4 mL/min (by  C-G formula based on SCr of 0.7 mg/dL). Liver Function Tests: Recent Labs  Lab 06/29/23 0651 06/30/23 0707 07/01/23 1241 07/02/23 0406 07/03/23 0201  AST 16 16 24 23 22   ALT 9 11 13 12 14   ALKPHOS 76 61 71 68 77  BILITOT 1.5* 1.2 1.3* 1.2 0.9  PROT 6.4* 6.6 7.1 6.3* 5.8*  ALBUMIN 1.9* 1.9* 2.2* 2.0* 1.8*   No  results for input(s): "LIPASE", "AMYLASE" in the last 168 hours. Recent Labs  Lab 06/30/23 0707  AMMONIA 55*   Coagulation Profile: Recent Labs  Lab 06/27/23 0219 06/30/23 0707  INR 1.2 1.3*   Cardiac Enzymes: No results for input(s): "CKTOTAL", "CKMB", "CKMBINDEX", "TROPONINI" in the last 168 hours. BNP (last 3 results) No results for input(s): "PROBNP" in the last 8760 hours. HbA1C: No results for input(s): "HGBA1C" in the last 72 hours. CBG: Recent Labs  Lab 06/27/23 0607  GLUCAP 82   Lipid Profile: No results for input(s): "CHOL", "HDL", "LDLCALC", "TRIG", "CHOLHDL", "LDLDIRECT" in the last 72 hours. Thyroid Function Tests: No results for input(s): "TSH", "T4TOTAL", "FREET4", "T3FREE", "THYROIDAB" in the last 72 hours. Anemia Panel: No results for input(s): "VITAMINB12", "FOLATE", "FERRITIN", "TIBC", "IRON", "RETICCTPCT" in the last 72 hours. Sepsis Labs: Recent Labs  Lab 06/27/23 0230 06/27/23 0439  LATICACIDVEN 1.8 1.0   Recent Results (from the past 240 hours)  Blood Culture (routine x 2)     Status: None   Collection Time: 06/27/23  2:16 AM   Specimen: BLOOD  Result Value Ref Range Status   Specimen Description BLOOD SITE NOT SPECIFIED  Final   Special Requests   Final    BOTTLES DRAWN AEROBIC AND ANAEROBIC Blood Culture adequate volume   Culture   Final    NO GROWTH 5 DAYS Performed at [facility], [address], [place], [place] [address]    Report Status 07/02/2023 FINAL  Final  Blood Culture (routine x 2)     Status: None   Collection Time: 06/27/23  2:25 AM   Specimen: BLOOD  Result Value Ref Range Status   Specimen  Description BLOOD SITE NOT SPECIFIED  Final   Special Requests   Final    BOTTLES DRAWN AEROBIC AND ANAEROBIC Blood Culture adequate volume   Culture   Final    NO GROWTH 5 DAYS Performed at [facility], [address] [place], [place] [address]    Report Status 07/02/2023 FINAL  Final  Aerobic Culture w Gram Stain (superficial specimen)     Status: None   Collection Time: 06/27/23  3:27 AM   Specimen: Leg  Result Value Ref Range Status   Specimen Description LEG RIGHT  Final   Special Requests NONE  Final   Gram Stain NO WBC SEEN RARE GRAM POSITIVE COCCI IN SINGLES   Final   Culture   Final    MODERATE METHICILLIN RESISTANT STAPHYLOCOCCUS AUREUS FEW STREPTOCOCCUS GROUP G Beta hemolytic streptococci are predictably susceptible to penicillin  and other beta lactams. Susceptibility testing not routinely performed. Performed at [facility], [address] [place], [place] [address]    Report Status 06/29/2023 FINAL  Final   Organism ID, Bacteria METHICILLIN RESISTANT STAPHYLOCOCCUS AUREUS  Final      Susceptibility   Methicillin resistant staphylococcus aureus - MIC*    CIPROFLOXACIN >=8 RESISTANT Resistant     ERYTHROMYCIN >=8 RESISTANT Resistant     GENTAMICIN  <=0.5 SENSITIVE Sensitive     OXACILLIN >=4 RESISTANT Resistant     TETRACYCLINE <=1 SENSITIVE Sensitive     VANCOMYCIN  1 SENSITIVE Sensitive     TRIMETH/SULFA >=320 RESISTANT Resistant     CLINDAMYCIN  <=0.25 SENSITIVE Sensitive     RIFAMPIN <=0.5 SENSITIVE Sensitive     Inducible Clindamycin  NEGATIVE Sensitive     LINEZOLID  2 SENSITIVE Sensitive     * MODERATE METHICILLIN RESISTANT STAPHYLOCOCCUS AUREUS  Surgical PCR screen  Status: Abnormal   Collection Time: 06/27/23  4:03 PM   Specimen: Nasal Mucosa; Nasal Swab  Result Value Ref Range Status   MRSA, PCR POSITIVE (A) NEGATIVE Final    Comment: RESULT CALLED TO, READ BACK BY AND VERIFIED WITH: RN CLINT WOOTEN ON 06/27/23 @ 1818 BY DRT     Staphylococcus aureus POSITIVE (A) NEGATIVE Final    Comment: (NOTE) The Xpert SA Assay (FDA approved for NASAL specimens in patients 18 years of age and older), is one component of a comprehensive surveillance program. It is not intended to diagnose infection nor to guide or monitor treatment. Performed at [facility], [address] [place], [place] [address]   Aerobic/Anaerobic Culture w Gram Stain (surgical/deep wound)     Status: None (Preliminary result)   Collection Time: 06/29/23 10:39 AM   Specimen: Leg, Right; Abscess  Result Value Ref Range Status   Specimen Description TISSUE  Final   Special Requests A SOFT TIS ABSCESS  Final   Gram Stain   Final    ABUNDANT WBC PRESENT, PREDOMINANTLY PMN NO ORGANISMS SEEN Performed at [facility], [address] [place], [place] [address]    Culture   Final    RARE METHICILLIN RESISTANT STAPHYLOCOCCUS AUREUS NO ANAEROBES ISOLATED; CULTURE IN PROGRESS FOR 5 DAYS    Report Status PENDING  Incomplete   Organism ID, Bacteria METHICILLIN RESISTANT STAPHYLOCOCCUS AUREUS  Final      Susceptibility   Methicillin resistant staphylococcus aureus - MIC*    CIPROFLOXACIN >=8 RESISTANT Resistant     ERYTHROMYCIN >=8 RESISTANT Resistant     GENTAMICIN  <=0.5 SENSITIVE Sensitive     OXACILLIN >=4 RESISTANT Resistant     TETRACYCLINE >=16 RESISTANT Resistant     VANCOMYCIN  1 SENSITIVE Sensitive     TRIMETH/SULFA >=320 RESISTANT Resistant     CLINDAMYCIN  <=0.25 SENSITIVE Sensitive     RIFAMPIN <=0.5 SENSITIVE Sensitive     Inducible Clindamycin  NEGATIVE Sensitive     LINEZOLID  2 SENSITIVE Sensitive     * RARE METHICILLIN RESISTANT STAPHYLOCOCCUS AUREUS  Aerobic/Anaerobic Culture w Gram Stain (surgical/deep wound)     Status: None (Preliminary result)   Collection Time: 06/29/23 10:41 AM   Specimen: Leg, Right; Abscess  Result Value Ref Range Status   Specimen Description ABSCESS  Final   Special Requests B  Final   Gram Stain    Final    ABUNDANT WBC PRESENT, PREDOMINANTLY PMN NO ORGANISMS SEEN Performed at [facility], [address] [place], [place] [address]    Culture   Final    RARE METHICILLIN RESISTANT STAPHYLOCOCCUS AUREUS NO ANAEROBES ISOLATED; CULTURE IN PROGRESS FOR 5 DAYS    Report Status PENDING  Incomplete   Organism ID, Bacteria METHICILLIN RESISTANT STAPHYLOCOCCUS AUREUS  Final      Susceptibility   Methicillin resistant staphylococcus aureus - MIC*    CIPROFLOXACIN >=8 RESISTANT Resistant     ERYTHROMYCIN >=8 RESISTANT Resistant     GENTAMICIN  <=0.5 SENSITIVE Sensitive     OXACILLIN >=4 RESISTANT Resistant     TETRACYCLINE >=16 RESISTANT Resistant     VANCOMYCIN  1 SENSITIVE Sensitive     TRIMETH/SULFA >=320 RESISTANT Resistant     CLINDAMYCIN  <=0.25 SENSITIVE Sensitive     RIFAMPIN <=0.5 SENSITIVE Sensitive     Inducible Clindamycin  NEGATIVE Sensitive     LINEZOLID  2 SENSITIVE Sensitive     * RARE METHICILLIN RESISTANT STAPHYLOCOCCUS AUREUS  Aerobic/Anaerobic Culture w Gram Stain (surgical/deep wound)  Status: None (Preliminary result)   Collection Time: 06/29/23 10:41 AM   Specimen: Leg, Right; Abscess  Result Value Ref Range Status   Specimen Description ABSCESS  Final   Special Requests C  Final   Gram Stain   Final    ABUNDANT WBC PRESENT, PREDOMINANTLY PMN NO ORGANISMS SEEN    Culture   Final    NO GROWTH 4 DAYS NO ANAEROBES ISOLATED; CULTURE IN PROGRESS FOR 5 DAYS Performed at [facility], [address], [place], [place] [address]    Report Status PENDING  Incomplete    Radiology Studies: US  EKG SITE RITE Result Date: 07/03/2023 If Site Rite image not attached, placement could not be confirmed due to current cardiac rhythm.  Scheduled Meds:  aspirin   325 mg Oral Daily   diltiazem   180 mg Oral Daily   docusate sodium   100 mg Oral BID   ketorolac   15 mg Intravenous Q6H   metoprolol  succinate  50 mg Oral Daily   sodium chloride  flush  10-40 mL  Intracatheter Q12H   sodium chloride  flush  10-40 mL Intracatheter Q12H   Continuous Infusions:  vancomycin  1,000 mg (07/03/23 0523)    LOS: 6 days   Aura Leeds, DO [facility] Available via Epic secure chat 7am-7pm After these hours, please refer to coverage provider listed on [email or web address] 07/03/2023, 5:09 PM

## 2023-07-04 DIAGNOSIS — T8450XD Infection and inflammatory reaction due to unspecified internal joint prosthesis, subsequent encounter: Secondary | ICD-10-CM | POA: Diagnosis not present

## 2023-07-04 DIAGNOSIS — M869 Osteomyelitis, unspecified: Secondary | ICD-10-CM | POA: Diagnosis not present

## 2023-07-04 LAB — COMPREHENSIVE METABOLIC PANEL WITH GFR
ALT: 17 U/L (ref 0–44)
AST: 26 U/L (ref 15–41)
Albumin: 2.3 g/dL — ABNORMAL LOW (ref 3.5–5.0)
Alkaline Phosphatase: 92 U/L (ref 38–126)
Anion gap: 4 — ABNORMAL LOW (ref 5–15)
BUN: 15 mg/dL (ref 6–20)
CO2: 28 mmol/L (ref 22–32)
Calcium: 9 mg/dL (ref 8.9–10.3)
Chloride: 100 mmol/L (ref 98–111)
Creatinine, Ser: 0.73 mg/dL (ref 0.61–1.24)
GFR, Estimated: >60 mL/min (ref >60)
Glucose, Bld: 153 mg/dL — ABNORMAL HIGH (ref 70–99)
Potassium: 5.4 mmol/L — ABNORMAL HIGH (ref 3.5–5.1)
Sodium: 132 mmol/L — ABNORMAL LOW (ref 135–145)
Total Bilirubin: 1.3 mg/dL — ABNORMAL HIGH (ref 0.0–1.2)
Total Protein: 6.9 g/dL (ref 6.5–8.1)

## 2023-07-04 LAB — VITAMIN B12: Vitamin B-12: 578 pg/mL (ref 180–914)

## 2023-07-04 LAB — AEROBIC/ANAEROBIC CULTURE W GRAM STAIN (SURGICAL/DEEP WOUND): Culture: NO GROWTH

## 2023-07-04 LAB — RETICULOCYTES
Immature Retic Fract: 22.9 % — ABNORMAL HIGH (ref 2.3–15.9)
RBC.: 4.51 MIL/uL (ref 4.22–5.81)
Retic Count, Absolute: 97.4 10*3/uL (ref 19.0–186.0)
Retic Ct Pct: 2.2 % (ref 0.4–3.1)

## 2023-07-04 LAB — CBC WITH DIFFERENTIAL/PLATELET
Abs Immature Granulocytes: 0.04 10*3/uL (ref 0.00–0.07)
Basophils Absolute: 0.1 10*3/uL (ref 0.0–0.1)
Basophils Relative: 1 %
Eosinophils Absolute: 0.4 10*3/uL (ref 0.0–0.5)
Eosinophils Relative: 4 %
HCT: 39.6 % (ref 39.0–52.0)
Hemoglobin: 12.9 g/dL — ABNORMAL LOW (ref 13.0–17.0)
Immature Granulocytes: 1 %
Lymphocytes Relative: 12 %
Lymphs Abs: 1.1 10*3/uL (ref 0.7–4.0)
MCH: 28.7 pg (ref 26.0–34.0)
MCHC: 32.6 g/dL (ref 30.0–36.0)
MCV: 88 fL (ref 80.0–100.0)
Monocytes Absolute: 0.8 10*3/uL (ref 0.1–1.0)
Monocytes Relative: 9 %
Neutro Abs: 6.3 10*3/uL (ref 1.7–7.7)
Neutrophils Relative %: 73 %
Platelets: 328 10*3/uL (ref 150–400)
RBC: 4.5 MIL/uL (ref 4.22–5.81)
RDW: 17.1 % — ABNORMAL HIGH (ref 11.5–15.5)
WBC: 8.7 10*3/uL (ref 4.0–10.5)
nRBC: 0 % (ref 0.0–0.2)

## 2023-07-04 LAB — IRON AND TIBC
Iron: 50 ug/dL (ref 45–182)
Saturation Ratios: 22 % (ref 17.9–39.5)
TIBC: 224 ug/dL — ABNORMAL LOW (ref 250–450)
UIBC: 174 ug/dL

## 2023-07-04 LAB — PHOSPHORUS: Phosphorus: 3.1 mg/dL (ref 2.5–4.6)

## 2023-07-04 LAB — FERRITIN: Ferritin: 83 ng/mL (ref 24–336)

## 2023-07-04 LAB — MAGNESIUM: Magnesium: 1.8 mg/dL (ref 1.7–2.4)

## 2023-07-04 LAB — VANCOMYCIN, PEAK: Vancomycin Pk: 21 ug/mL — ABNORMAL LOW (ref 30–40)

## 2023-07-04 LAB — FOLATE: Folate: 8.6 ng/mL (ref >5.9)

## 2023-07-04 NOTE — Progress Notes (Signed)
 PROGRESS NOTE    Jonathan Ibarra  AVW:[id number] DOB: 02/25/1966 DOA: 06/26/2023 PCP: Sharry Deem, MD   Brief Narrative:  Jonathan Ibarra is a 58 y.o. male with past medical history significant for tibial plateau fracture, right knee septic arthritis with hardware removal, I&D, wound VAC placement February 2025, HTN, SVT, paroxysmal atrial fibrillation not on anticoagulation, cirrhosis s/p TIPS, chronic thrombocytopenia, anemia of chronic medical disease, polysubstance abuse who presented to [facility] ED on 06/26/2023 with right knee pain, swelling, drainage. He reported he was in his usual state of health until this past week when he noticed increased pain, swelling, erythema and puslike drainage from his right knee.    Imaging was done and MR right knee without contrast showed enlarging complex knee joint effusion with diffuse periarticular marrow edema suspicious for septic arthritis and osteomyelitis, probable associated intraosseous abscess proximal tibia with fistulous tract communicating with a lateral soft tissue component, extensive periarticular soft tissue edema surrounding the knee.  Blood cultures x 2 obtained.  Patient was started on a Cardizem  drip for A-fib with RVR.  Orthopedics consulted and ID consulted. 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  He underwent I&D and is POD day 5 with I&D. PT/OT recommending SNF. ID recommending 4 weeks of IV Vancomycin  (in hospital vs SNF) but likely in the hospital and then Oritavancin  to complete course w/ then with chronic oral Abx for suppression.   Assessment and Plan:   Right knee septic arthritis with abscess with Concern for osteomyelitis: Patient presenting with 1 week history of progressive right knee swelling, erythema, pain with purulent discharge.  Noncompliant with outpatient therapy including IV antibiotics and  follow-up with infectious disease.  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  Patient is afebrile without leukocytosis.  MR right knee with findings consistent with acute septic arthritis with concern for osteomyelitis and abscess formation.  Seen by orthopedics, Dr. Curtiss Dowdy who initially recommended AKA, but patient declined with planned I&D on 4/30. CRP was 8.7. Orthopedics ([facility]) following and consulted ID. Blood cultures x 2: No growth x 2 days;  Superficial leg culture: + MRSA; C/w  IV Vancomycin  @ current dose, pharmacy consulted for dosing/monitoring. Pain control w/ Oxycodone  15 mg p.o. every 4 hours as needed severe pain, Robaxin  750 mg p.o. every 8 hours as needed muscle spasms, and Hydromorphone  1 mg IV every 4 hours as needed severe pain not relieved with oral medication. S/p Excision of intraosseous abscess R Proximal Tibia and I&D of R Leg Abscess and Placement of Abx Beads Intraosseously.  Operative cultures are growing 1 out of 3 Staph aureus.  -Patient Now wants to go to SNF; initially removed multiple IV's and now has Midline temporarily which is working well but and plan was to transition to PICC line on 07/07/2023 however PICC removed is midline now.  After further discussion with the infectious disease team and the IV team her decision has been made to place a tunneled catheter for the patient as it would be more difficult to remove. Per ID will need at least 4 weeks of IV Abx with Vancomycin  with Transition to Oritavancin  to complete Course of Abx and then oral Abx for Suppression.  Paroxysmal Atrial Fibrillation with RVR  Essential HTN: Patient presenting with elevated heart rate of 153.  Initially placed on Cardizem  drip.  Home medications restarted and was able to be  titrated off.  Not on anticoagulation outpatient. C/w Diltiazem  180 mg p.o. daily and Metoprolol  Succinate 50 mg p.o. daily. HR remains well-controlled now, discontinued  telemetry. CTM BP per protocol. Last BP reading was 121/88.   Cirrhosis s/p TIPS: C/w 2 gram Low-salt diet. Will need  Outpatient follow-up with GI. Bilirubin was elevated at 1.5 on last check but LFTs normal. CTM and Trend and watch for decompensation. PT-INR was 16.0-1.3 respectively on last check on 5 1   Anemia of Chronic Medical Disease: Hgb/Hct relatively stable and is now 12.9/39.6. Check Anemia Panel in the AM. CTM for S/Sx of Bleeding; No overt bleeding noted. Repeat CBC in the AM   Hyponatremia: Mild. Na+ ranging from 131-134 and was 132 today. CTM and trend and Repeat CMP in the AM  Hyperkalemia: K+ was 5.2 and Given 1x dose of 10 grams of Lokelama; Now 4.1 today.   Polysubstance use: UDS positive for amphetamines, opiates, THC. Counseled on need for complete abstinence/cessation from illicit substances.  Hyperbilirubinemia: Fluctuating. Ranging between 0.9-1.3. CTM and Trend and repeat CMP in the AM  Hypoalbuminemia: Patient's Albumin is now 2.3. CTM and Trend and repeat CMP in the AM  Class I Obesity: Complicates overall prognosis and care. Estimated body mass index is 33.64 kg/m as calculated from the following:   Height as of this encounter: 6\' 1"  (1.854 m).   Weight as of this encounter: 115.7 kg. Weight Loss and Dietary Counseling given   DVT prophylaxis: SCDs Start: 06/29/23 1203    Code Status: Full Code Family Communication: No family present @ bedside  Disposition Plan:  Level of care: Med-Surg Status is: Inpatient Remains inpatient appropriate because: Needs long-term antibiotics and PT OT recommending SNF.    Consultants:  Orthopedic Surgery ID IR for Tunneled Catheter Placement  Procedures:  Procedures: CPT 27360-Excision of intraosseous abscess right proximal tibia CPT 27603-Incision and drainage of right leg abscess CPT 11981-Placement of antibiotic beads intraosseously  Antimicrobials:  Anti-infectives (From admission, onward)    Start      Dose/Rate Route Frequency Ordered Stop   06/29/23 2200  vancomycin  (VANCOCIN ) IVPB 1000 mg/200 mL premix        1,000 mg 200 mL/hr over 60 Minutes Intravenous Every 12 hours 06/29/23 2010     06/29/23 1038  gentamicin  (GARAMYCIN ) injection  Status:  Discontinued          As needed 06/29/23 1038 06/29/23 1108   06/29/23 1029  tobramycin  (NEBCIN ) powder  Status:  Discontinued          As needed 06/29/23 1029 06/29/23 1108   06/29/23 1029  vancomycin  (VANCOCIN ) powder  Status:  Discontinued          As needed 06/29/23 1030 06/29/23 1108   06/29/23 0845  ceFAZolin  (ANCEF ) IVPB 2g/100 mL premix        2 g 200 mL/hr over 30 Minutes Intravenous On call to O.R. 06/29/23 0837 06/29/23 1018   06/29/23 0805  ceFAZolin  (ANCEF ) 2-4 GM/100ML-% IVPB       Note to Pharmacy: Emeterio Hansen, GRETA: cabinet override      06/29/23 0805 06/29/23 1019   06/27/23 1800  vancomycin  (VANCOREADY) IVPB 1500 mg/300 mL  Status:  Discontinued        1,500 mg 150 mL/hr over 120 Minutes Intravenous Every 12 hours 06/27/23 1429 06/29/23 2010   06/27/23 0315  cefTRIAXone  (ROCEPHIN ) 2 g in sodium chloride  0.9 % 100 mL IVPB        2 g 200  mL/hr over 30 Minutes Intravenous Once 06/27/23 0313 06/27/23 0512   06/27/23 0315  vancomycin  (VANCOCIN ) IVPB 1000 mg/200 mL premix  Status:  Discontinued        1,000 mg 200 mL/hr over 60 Minutes Intravenous  Once 06/27/23 0313 06/27/23 0314   06/27/23 0315  vancomycin  (VANCOREADY) IVPB 2000 mg/400 mL        2,000 mg 200 mL/hr over 120 Minutes Intravenous  Once 06/27/23 0314 06/27/23 0701       Subjective: Seen and examined at bedside and is resting and awoken from sleep.  Had no nausea or vomiting but states that he did have some mild abdominal discomfort.  Also complaining some right knee pain.  Feels okay otherwise.  Midline was in place however he subsequently removed it again so we will now need a tunneled central line catheter.  No other concerns or complaints at this  time.  Objective: Vitals:   07/03/23 2029 07/04/23 0444 07/04/23 0850 07/04/23 0949  BP: 128/81 137/86 137/86 121/88  Pulse: 60 65 65 65  Resp: 16 17  17   Temp: 98.1 F (36.7 C) 98.7 F (37.1 C)  97.7 F (36.5 C)  TempSrc: Oral Oral  Oral  SpO2: 100% 100%  96%  Weight:      Height:        Intake/Output Summary (Last 24 hours) at 07/04/2023 1512 Last data filed at 07/04/2023 1610 Gross per 24 hour  Intake --  Output 300 ml  Net -300 ml   Filed Weights   06/26/23 2340  Weight: 115.7 kg   Examination: Physical Exam:  Constitutional: WN/WD obese chronically ill-appearing Caucasian male appears calm and is resting Respiratory: Diminished to auscultation bilaterally, no wheezing, rales, rhonchi or crackles. Normal respiratory effort and patient is not tachypenic. No accessory muscle use.  Unlabored breathing Cardiovascular: RRR, no murmurs / rubs / gallops. S1 and S2 auscultated. No extremity edema.  Abdomen: Soft, non-tender, non-distended. Bowel sounds positive.  GU: Deferred. Musculoskeletal: No clubbing / cyanosis of digits/nails. No joint deformity upper and lower extremities. Skin: Right knee is wrapped and he has multiple tattoos scattered diffusely throughout his body Neurologic: CN 2-12 grossly intact with no focal deficits.  Romberg sign and cerebellar reflexes not assessed.  Psychiatric: Normal judgment and insight. Alert and oriented x 3.   Data Reviewed: I have personally reviewed following labs and imaging studies  CBC: Recent Labs  Lab 06/30/23 0707 07/01/23 1241 07/02/23 0406 07/03/23 0201 07/04/23 1053  WBC 7.9 10.5 8.7 8.2 8.7  NEUTROABS 7.1 8.4* 6.3 5.4 6.3  HGB 11.2* 12.4* 11.5* 11.1* 12.9*  HCT 33.5* 38.0* 35.5* 34.2* 39.6  MCV 84.4 85.8 86.6 87.9 88.0  PLT 293 371 331 274 328   Basic Metabolic Panel: Recent Labs  Lab 06/30/23 0707 07/01/23 1241 07/02/23 0406 07/03/23 0201 07/04/23 1053  NA 131* 132* 132* 134* 132*  K 4.6 5.2* 4.1 4.1  5.4*  CL 101 101 101 102 100  CO2 22 25 25 27 28   GLUCOSE 130* 134* 132* 129* 153*  BUN 17 20 20 17 15   CREATININE 0.55* 0.72 0.69 0.70 0.73  CALCIUM  8.8* 9.4 8.7* 8.4* 9.0  MG 1.7 1.9 1.7 1.8 1.8  PHOS 3.0 3.3 3.1 3.1 3.1   GFR: Estimated Creatinine Clearance: 137.4 mL/min (by C-G formula based on SCr of 0.73 mg/dL). Liver Function Tests: Recent Labs  Lab 06/30/23 0707 07/01/23 1241 07/02/23 0406 07/03/23 0201 07/04/23 1053  AST 16 24 23 22  26  ALT 11 13 12 14 17   ALKPHOS 61 71 68 77 92  BILITOT 1.2 1.3* 1.2 0.9 1.3*  PROT 6.6 7.1 6.3* 5.8* 6.9  ALBUMIN 1.9* 2.2* 2.0* 1.8* 2.3*   No results for input(s): "LIPASE", "AMYLASE" in the last 168 hours. Recent Labs  Lab 06/30/23 0707  AMMONIA 55*   Coagulation Profile: Recent Labs  Lab 06/30/23 0707  INR 1.3*   Cardiac Enzymes: No results for input(s): "CKTOTAL", "CKMB", "CKMBINDEX", "TROPONINI" in the last 168 hours. BNP (last 3 results) No results for input(s): "PROBNP" in the last 8760 hours. HbA1C: No results for input(s): "HGBA1C" in the last 72 hours. CBG: No results for input(s): "GLUCAP" in the last 168 hours. Lipid Profile: No results for input(s): "CHOL", "HDL", "LDLCALC", "TRIG", "CHOLHDL", "LDLDIRECT" in the last 72 hours. Thyroid Function Tests: No results for input(s): "TSH", "T4TOTAL", "FREET4", "T3FREE", "THYROIDAB" in the last 72 hours. Anemia Panel: Recent Labs    07/04/23 1053  VITAMINB12 578  FOLATE 8.6  FERRITIN 83  TIBC 224*  IRON 50  RETICCTPCT 2.2   Sepsis Labs: No results for input(s): "PROCALCITON", "LATICACIDVEN" in the last 168 hours.  Recent Results (from the past 240 hours)  Blood Culture (routine x 2)     Status: None   Collection Time: 06/27/23  2:16 AM   Specimen: BLOOD  Result Value Ref Range Status   Specimen Description BLOOD SITE NOT SPECIFIED  Final   Special Requests   Final    BOTTLES DRAWN AEROBIC AND ANAEROBIC Blood Culture adequate volume   Culture   Final     NO GROWTH 5 DAYS Performed at [facility], [address], [place], [place] [address]    Report Status 07/02/2023 FINAL  Final  Blood Culture (routine x 2)     Status: None   Collection Time: 06/27/23  2:25 AM   Specimen: BLOOD  Result Value Ref Range Status   Specimen Description BLOOD SITE NOT SPECIFIED  Final   Special Requests   Final    BOTTLES DRAWN AEROBIC AND ANAEROBIC Blood Culture adequate volume   Culture   Final    NO GROWTH 5 DAYS Performed at [id number], [name], [place], [place] [address]    Report Status 07/02/2023 FINAL  Final  Aerobic Culture w Gram Stain (superficial specimen)     Status: None   Collection Time: 06/27/23  3:27 AM   Specimen: Leg  Result Value Ref Range Status   Specimen Description LEG RIGHT  Final   Special Requests NONE  Final   Gram Stain NO WBC SEEN RARE GRAM POSITIVE COCCI IN SINGLES   Final   Culture   Final    MODERATE METHICILLIN RESISTANT STAPHYLOCOCCUS AUREUS FEW STREPTOCOCCUS GROUP G Beta hemolytic streptococci are predictably susceptible to penicillin  and other beta lactams. Susceptibility testing not routinely performed. Performed at [facility], [address], [place], [place] [address]    Report Status 06/29/2023 FINAL  Final   Organism ID, Bacteria METHICILLIN RESISTANT STAPHYLOCOCCUS AUREUS  Final      Susceptibility   Methicillin resistant staphylococcus aureus - MIC*    CIPROFLOXACIN >=8 RESISTANT Resistant     ERYTHROMYCIN >=8 RESISTANT Resistant     GENTAMICIN  <=0.5 SENSITIVE Sensitive     OXACILLIN >=4 RESISTANT Resistant     TETRACYCLINE <=1 SENSITIVE Sensitive     VANCOMYCIN  1 SENSITIVE Sensitive     TRIMETH/SULFA >=320 RESISTANT Resistant     CLINDAMYCIN  <=  0.25 SENSITIVE Sensitive     RIFAMPIN <=0.5 SENSITIVE Sensitive     Inducible Clindamycin  NEGATIVE Sensitive     LINEZOLID  2 SENSITIVE Sensitive     * MODERATE METHICILLIN RESISTANT STAPHYLOCOCCUS AUREUS  Surgical PCR screen      Status: Abnormal   Collection Time: 06/27/23  4:03 PM   Specimen: Nasal Mucosa; Nasal Swab  Result Value Ref Range Status   MRSA, PCR POSITIVE (A) NEGATIVE Final    Comment: RESULT CALLED TO, READ BACK BY AND VERIFIED WITH: RN CLINT WOOTEN ON 06/27/23 @ 1818 BY DRT    Staphylococcus aureus POSITIVE (A) NEGATIVE Final    Comment: (NOTE) The Xpert SA Assay (FDA approved for NASAL specimens in patients 2 years of age and older), is one component of a comprehensive surveillance program. It is not intended to diagnose infection nor to guide or monitor treatment. Performed at [facility], [address] [place], [place] [address]   Aerobic/Anaerobic Culture w Gram Stain (surgical/deep wound)     Status: None (Preliminary result)   Collection Time: 06/29/23 10:39 AM   Specimen: Leg, Right; Abscess  Result Value Ref Range Status   Specimen Description TISSUE  Final   Special Requests A SOFT TIS ABSCESS  Final   Gram Stain   Final    ABUNDANT WBC PRESENT, PREDOMINANTLY PMN NO ORGANISMS SEEN Performed at [facility], [address] [place], [place] [address]    Culture   Final    RARE METHICILLIN RESISTANT STAPHYLOCOCCUS AUREUS NO ANAEROBES ISOLATED; CULTURE IN PROGRESS FOR 5 DAYS    Report Status PENDING  Incomplete   Organism ID, Bacteria METHICILLIN RESISTANT STAPHYLOCOCCUS AUREUS  Final      Susceptibility   Methicillin resistant staphylococcus aureus - MIC*    CIPROFLOXACIN >=8 RESISTANT Resistant     ERYTHROMYCIN >=8 RESISTANT Resistant     GENTAMICIN  <=0.5 SENSITIVE Sensitive     OXACILLIN >=4 RESISTANT Resistant     TETRACYCLINE >=16 RESISTANT Resistant     VANCOMYCIN  1 SENSITIVE Sensitive     TRIMETH/SULFA >=320 RESISTANT Resistant     CLINDAMYCIN  <=0.25 SENSITIVE Sensitive     RIFAMPIN <=0.5 SENSITIVE Sensitive     Inducible Clindamycin  NEGATIVE Sensitive     LINEZOLID  2 SENSITIVE Sensitive     * RARE METHICILLIN RESISTANT STAPHYLOCOCCUS AUREUS   Aerobic/Anaerobic Culture w Gram Stain (surgical/deep wound)     Status: None (Preliminary result)   Collection Time: 06/29/23 10:41 AM   Specimen: Leg, Right; Abscess  Result Value Ref Range Status   Specimen Description ABSCESS  Final   Special Requests B  Final   Gram Stain   Final    ABUNDANT WBC PRESENT, PREDOMINANTLY PMN NO ORGANISMS SEEN Performed at [facility], [address] [place], [place] [address]    Culture   Final    RARE METHICILLIN RESISTANT STAPHYLOCOCCUS AUREUS NO ANAEROBES ISOLATED; CULTURE IN PROGRESS FOR 5 DAYS    Report Status PENDING  Incomplete   Organism ID, Bacteria METHICILLIN RESISTANT STAPHYLOCOCCUS AUREUS  Final      Susceptibility   Methicillin resistant staphylococcus aureus - MIC*    CIPROFLOXACIN >=8 RESISTANT Resistant     ERYTHROMYCIN >=8 RESISTANT Resistant     GENTAMICIN  <=0.5 SENSITIVE Sensitive     OXACILLIN >=4 RESISTANT Resistant     TETRACYCLINE >=16 RESISTANT Resistant     VANCOMYCIN  1 SENSITIVE Sensitive     TRIMETH/SULFA >=320 RESISTANT Resistant     CLINDAMYCIN  <=  0.25 SENSITIVE Sensitive     RIFAMPIN <=0.5 SENSITIVE Sensitive     Inducible Clindamycin  NEGATIVE Sensitive     LINEZOLID  2 SENSITIVE Sensitive     * RARE METHICILLIN RESISTANT STAPHYLOCOCCUS AUREUS  Aerobic/Anaerobic Culture w Gram Stain (surgical/deep wound)     Status: None (Preliminary result)   Collection Time: 06/29/23 10:41 AM   Specimen: Leg, Right; Abscess  Result Value Ref Range Status   Specimen Description ABSCESS  Final   Special Requests C  Final   Gram Stain   Final    ABUNDANT WBC PRESENT, PREDOMINANTLY PMN NO ORGANISMS SEEN    Culture   Final    NO GROWTH 4 DAYS NO ANAEROBES ISOLATED; CULTURE IN PROGRESS FOR 5 DAYS Performed at [facility], [address] [place], [place] [address]    Report Status PENDING  Incomplete    Radiology Studies: US  EKG SITE RITE Result Date: 07/03/2023 If Site Rite image not attached, placement  could not be confirmed due to current cardiac rhythm.  Scheduled Meds:  aspirin   325 mg Oral Daily   diltiazem   180 mg Oral Daily   docusate sodium   100 mg Oral BID   metoprolol  succinate  50 mg Oral Daily   sodium chloride  flush  10-40 mL Intracatheter Q12H   Continuous Infusions:  vancomycin  1,000 mg (07/04/23 0546)    LOS: 7 days   Aura Leeds, DO [facility] Available via Epic secure chat 7am-7pm After these hours, please refer to coverage provider listed on [email or web address] 07/04/2023, 3:12 PM

## 2023-07-04 NOTE — Progress Notes (Signed)
 Pt pulled out midline IV in left upper arm. IV team consult placed for new IV

## 2023-07-04 NOTE — Progress Notes (Signed)
 Sandi Crosby from IV team stated that Patients Midline has no blood return, and to call lab to draw blood. Called Phlebotomy [phone] and made them aware and they stated they would be up soon.

## 2023-07-04 NOTE — TOC Progression Note (Signed)
 Transition of Care [facility]) - Progression Note    Patient Details  Name: Sandi Zelenka MRN: [id number] Date of Birth: [birth year]  [facility] CM/SW Contact  Arron Big, [place] Phone Number: 07/04/2023, 1:49 PM  Clinical Narrative:   Per VAS/IV team, PICC to be reordered on the 8th.   CSW left VM for [facility] admissions to discuss disposition.  TOC will continue to follow.    Expected Discharge Plan: Skilled Nursing Facility Barriers to Discharge: English as a second language teacher, Continued Medical Work up  Expected Discharge Plan and Services   Discharge Planning Services: CM Consult Post Acute Care Choice:  (await PT eval post op) Living arrangements for the past 2 months: Single Family Home                 DME Arranged:  (await post op eval)         HH Arranged:  (see note)           Social Determinants of Health (SDOH) Interventions SDOH Screenings   Food Insecurity: Food Insecurity Present (06/27/2023)  Housing: High Risk (06/27/2023)  Transportation Needs: Unmet Transportation Needs (06/27/2023)  Utilities: Not At Risk (06/27/2023)  Financial Resource Strain: Medium Risk (01/07/2020)   Received from [facility]  Social Connections: Unknown (07/05/2021)   Received from [facility], [facility]  Stress: No Stress Concern Present (01/07/2020)   Received from [facility], [facility]  Tobacco Use: High Risk (06/29/2023)    Readmission Risk Interventions     No data to display

## 2023-07-04 NOTE — Plan of Care (Signed)

## 2023-07-04 NOTE — Progress Notes (Signed)
 Assisted pt to and from bathroom. Pt resting in bed. Meal tray set up. Lab at bedside. Bed alarm on. Call light in reach. All needs met at this time.

## 2023-07-04 NOTE — Progress Notes (Signed)
 [facility]  Date of Admission:  06/26/2023     Reason for Follow Up: Prosthetic joint infection, subsequent encounter  Total days of antibiotics 9         ASSESSMENT:  Jonathan Ibarra is postop day #5 from excision of intraosseous abscess of the right proximal tibia and debridement of right leg abscess in the setting of right tibial osteomyelitis with intraosseous abscess.  Appears to be tolerating antibiotics with no adverse side effects with most recent vancomycin  level of 16 and therapeutic.  Continue therapeutic drug monitoring of renal function and vancomycin  levels.  Postoperative wound care per orthopedics.  Reminded of importance of maintaining IV access to receive treatment.  Disposition remains to be determined although not a candidate to go home with a PICC line.  Contact precautions for MRSA.  Continue current dose of vancomycin .  Remaining medical and supportive care per internal medicine.  PLAN:  Continue current dose of vancomycin . Postoperative wound care per orthopedics. Midline care per protocol. Therapeutic drug monitoring of renal function and vancomycin  levels. Continue contact precautions for MRSA. Remaining medical and supportive care per internal medicine.  Principal Problem:   Prosthetic joint infection, subsequent encounter Active Problems:   Osteomyelitis (HCC)    aspirin   325 mg Oral Daily   diltiazem   180 mg Oral Daily   docusate sodium   100 mg Oral BID   metoprolol  succinate  50 mg Oral Daily   sodium chloride  flush  10-40 mL Intracatheter Q12H    SUBJECTIVE:  Afebrile overnight with no acute events.  Tolerating antibiotics with no adverse side effects.  Denies fevers, chills, or sweats.  Allergies  Allergen Reactions   Tylenol  [Acetaminophen ] Other (See Comments)    Impacts liver     Review of Systems: Review of Systems  Constitutional:  Negative for chills, fever and weight loss.  Respiratory:  Negative for  cough, shortness of breath and wheezing.   Cardiovascular:  Negative for chest pain and leg swelling.  Gastrointestinal:  Negative for abdominal pain, constipation, diarrhea, nausea and vomiting.  Skin:  Negative for rash.      OBJECTIVE: Vitals:   07/03/23 2029 07/04/23 0444 07/04/23 0850 07/04/23 0949  BP: 128/81 137/86 137/86 121/88  Pulse: 60 65 65 65  Resp: 16 17  17   Temp: 98.1 F (36.7 C) 98.7 F (37.1 C)  97.7 F (36.5 C)  TempSrc: Oral Oral  Oral  SpO2: 100% 100%  96%  Weight:      Height:       Body mass index is 33.64 kg/m.  Physical Exam Constitutional:      General: He is not in acute distress.    Appearance: He is well-developed.  Cardiovascular:     Rate and Rhythm: Normal rate and regular rhythm.     Heart sounds: Normal heart sounds.     Comments: Midline left upper extremity Pulmonary:     Effort: Pulmonary effort is normal.     Breath sounds: Normal breath sounds.  Skin:    General: Skin is warm and dry.  Neurological:     Mental Status: He is alert.     Lab Results Lab Results  Component Value Date   WBC 8.7 07/04/2023   HGB 12.9 (L) 07/04/2023   HCT 39.6 07/04/2023   MCV 88.0 07/04/2023   PLT 328 07/04/2023    Lab Results  Component Value Date   CREATININE 0.73 07/04/2023   BUN 15 07/04/2023   NA  132 (L) 07/04/2023   K 5.4 (H) 07/04/2023   CL 100 07/04/2023   CO2 28 07/04/2023    Lab Results  Component Value Date   ALT 17 07/04/2023   AST 26 07/04/2023   ALKPHOS 92 07/04/2023   BILITOT 1.3 (H) 07/04/2023     Microbiology: Recent Results (from the past 240 hours)  Blood Culture (routine x 2)     Status: None   Collection Time: 06/27/23  2:16 AM   Specimen: BLOOD  Result Value Ref Range Status   Specimen Description BLOOD SITE NOT SPECIFIED  Final   Special Requests   Final    BOTTLES DRAWN AEROBIC AND ANAEROBIC Blood Culture adequate volume   Culture   Final    NO GROWTH 5 DAYS Performed at [facility],  [address], [place], [place] [address]    Report Status 07/02/2023 FINAL  Final  Blood Culture (routine x 2)     Status: None   Collection Time: 06/27/23  2:25 AM   Specimen: BLOOD  Result Value Ref Range Status   Specimen Description BLOOD SITE NOT SPECIFIED  Final   Special Requests   Final    BOTTLES DRAWN AEROBIC AND ANAEROBIC Blood Culture adequate volume   Culture   Final    NO GROWTH 5 DAYS Performed at [facility], [address] [place], [place] [address]    Report Status 07/02/2023 FINAL  Final  Aerobic Culture w Gram Stain (superficial specimen)     Status: None   Collection Time: 06/27/23  3:27 AM   Specimen: Leg  Result Value Ref Range Status   Specimen Description LEG RIGHT  Final   Special Requests NONE  Final   Gram Stain NO WBC SEEN RARE GRAM POSITIVE COCCI IN SINGLES   Final   Culture   Final    MODERATE METHICILLIN RESISTANT STAPHYLOCOCCUS AUREUS FEW STREPTOCOCCUS GROUP G Beta hemolytic streptococci are predictably susceptible to penicillin  and other beta lactams. Susceptibility testing not routinely performed. Performed at [facility], [address] [place], [place] [address]    Report Status 06/29/2023 FINAL  Final   Organism ID, Bacteria METHICILLIN RESISTANT STAPHYLOCOCCUS AUREUS  Final      Susceptibility   Methicillin resistant staphylococcus aureus - MIC*    CIPROFLOXACIN >=8 RESISTANT Resistant     ERYTHROMYCIN >=8 RESISTANT Resistant     GENTAMICIN  <=0.5 SENSITIVE Sensitive     OXACILLIN >=4 RESISTANT Resistant     TETRACYCLINE <=1 SENSITIVE Sensitive     VANCOMYCIN  1 SENSITIVE Sensitive     TRIMETH/SULFA >=320 RESISTANT Resistant     CLINDAMYCIN  <=0.25 SENSITIVE Sensitive     RIFAMPIN <=0.5 SENSITIVE Sensitive     Inducible Clindamycin  NEGATIVE Sensitive     LINEZOLID  2 SENSITIVE Sensitive     * MODERATE METHICILLIN RESISTANT STAPHYLOCOCCUS AUREUS  Surgical PCR screen     Status: Abnormal   Collection Time: 06/27/23  4:03 PM    Specimen: Nasal Mucosa; Nasal Swab  Result Value Ref Range Status   MRSA, PCR POSITIVE (A) NEGATIVE Final    Comment: RESULT CALLED TO, READ BACK BY AND VERIFIED WITH: RN CLINT WOOTEN ON 06/27/23 @ 1818 BY DRT    Staphylococcus aureus POSITIVE (A) NEGATIVE Final    Comment: (NOTE) The Xpert SA Assay (FDA approved for NASAL specimens in patients 28 years of age and older), is one component of a comprehensive surveillance program. It is not intended to diagnose infection  nor to guide or monitor treatment. Performed at [facility], [address], [place], [place] [address]   Aerobic/Anaerobic Culture w Gram Stain (surgical/deep wound)     Status: None (Preliminary result)   Collection Time: 06/29/23 10:39 AM   Specimen: Leg, Right; Abscess  Result Value Ref Range Status   Specimen Description TISSUE  Final   Special Requests A SOFT TIS ABSCESS  Final   Gram Stain   Final    ABUNDANT WBC PRESENT, PREDOMINANTLY PMN NO ORGANISMS SEEN Performed at [facility], [address] [place], [place] [address]    Culture   Final    RARE METHICILLIN RESISTANT STAPHYLOCOCCUS AUREUS NO ANAEROBES ISOLATED; CULTURE IN PROGRESS FOR 5 DAYS    Report Status PENDING  Incomplete   Organism ID, Bacteria METHICILLIN RESISTANT STAPHYLOCOCCUS AUREUS  Final      Susceptibility   Methicillin resistant staphylococcus aureus - MIC*    CIPROFLOXACIN >=8 RESISTANT Resistant     ERYTHROMYCIN >=8 RESISTANT Resistant     GENTAMICIN  <=0.5 SENSITIVE Sensitive     OXACILLIN >=4 RESISTANT Resistant     TETRACYCLINE >=16 RESISTANT Resistant     VANCOMYCIN  1 SENSITIVE Sensitive     TRIMETH/SULFA >=320 RESISTANT Resistant     CLINDAMYCIN  <=0.25 SENSITIVE Sensitive     RIFAMPIN <=0.5 SENSITIVE Sensitive     Inducible Clindamycin  NEGATIVE Sensitive     LINEZOLID  2 SENSITIVE Sensitive     * RARE METHICILLIN RESISTANT STAPHYLOCOCCUS AUREUS  Aerobic/Anaerobic Culture w Gram Stain (surgical/deep wound)      Status: None (Preliminary result)   Collection Time: 06/29/23 10:41 AM   Specimen: Leg, Right; Abscess  Result Value Ref Range Status   Specimen Description ABSCESS  Final   Special Requests B  Final   Gram Stain   Final    ABUNDANT WBC PRESENT, PREDOMINANTLY PMN NO ORGANISMS SEEN Performed at [facility], [address], [place], [place] [address]    Culture   Final    RARE METHICILLIN RESISTANT STAPHYLOCOCCUS AUREUS NO ANAEROBES ISOLATED; CULTURE IN PROGRESS FOR 5 DAYS    Report Status PENDING  Incomplete   Organism ID, Bacteria METHICILLIN RESISTANT STAPHYLOCOCCUS AUREUS  Final      Susceptibility   Methicillin resistant staphylococcus aureus - MIC*    CIPROFLOXACIN >=8 RESISTANT Resistant     ERYTHROMYCIN >=8 RESISTANT Resistant     GENTAMICIN  <=0.5 SENSITIVE Sensitive     OXACILLIN >=4 RESISTANT Resistant     TETRACYCLINE >=16 RESISTANT Resistant     VANCOMYCIN  1 SENSITIVE Sensitive     TRIMETH/SULFA >=320 RESISTANT Resistant     CLINDAMYCIN  <=0.25 SENSITIVE Sensitive     RIFAMPIN <=0.5 SENSITIVE Sensitive     Inducible Clindamycin  NEGATIVE Sensitive     LINEZOLID  2 SENSITIVE Sensitive     * RARE METHICILLIN RESISTANT STAPHYLOCOCCUS AUREUS  Aerobic/Anaerobic Culture w Gram Stain (surgical/deep wound)     Status: None (Preliminary result)   Collection Time: 06/29/23 10:41 AM   Specimen: Leg, Right; Abscess  Result Value Ref Range Status   Specimen Description ABSCESS  Final   Special Requests C  Final   Gram Stain   Final    ABUNDANT WBC PRESENT, PREDOMINANTLY PMN NO ORGANISMS SEEN    Culture   Final    NO GROWTH 4 DAYS NO ANAEROBES ISOLATED; CULTURE IN PROGRESS FOR 5 DAYS Performed at [facility], [address], [place], [place] [address]    Report Status  PENDING  Incomplete     Marlan Silva, NP [facility]  07/04/2023  1:56 PM

## 2023-07-04 NOTE — Progress Notes (Signed)
 Physical Therapy Treatment Patient Details Name: Jonathan Ibarra MRN: [id number] DOB: 20-May-1965 Today's Date: 07/04/2023   History of Present Illness Pt is a 58 y.o. male admitted 4/27 for c/o of R knee swelling & drainage. Multiple knee surgeries since October d/t infections. MRI showed suspicion of septic arthritis & osteomyelitis, degenerative tearing of lateral meniscus. I&D of R knee on 06/29/2023. PMH: cirrhosis, afib, HTN, SVT, polysubstance abuse,  Pt s/p 04/20/23 removal of hardware, I & D of R knee    PT Comments  Pt tolerates treatment well, ambulating for increased distances this session. PT notes pt prefers to rest with R knee in flexion and is unable to tolerate full extension of R knee during ambulation. PT provides education on the need for R knee extension when resting to maintain full ROM. PT also provides education on exercise for activation of R knee extensors. Patient will benefit from continued inpatient follow up therapy, <3 hours/day.    If plan is discharge home, [name];[name];Assist for transportation;Help with stairs or ramp for entrance   Can travel by private vehicle     Yes  Equipment Recommendations  Rolling walker (2 wheels)    Recommendations for Other Services       Precautions / Restrictions Precautions Precautions: Fall Recall of Precautions/Restrictions: Intact Restrictions Weight Bearing Restrictions Per Provider Order: Yes RLE Weight Bearing Per Provider Order: Weight bearing as tolerated     Mobility  Bed Mobility Overal bed mobility: Needs Assistance Bed Mobility: Supine to Sit, Sit to Supine     Supine to sit: Supervision, [name] Sit to supine: Supervision, [name]        Transfers Overall transfer level: Needs assistance Equipment used: Rolling walker (2 wheels) Transfers: Sit to/from Stand Sit to Stand: Contact guard  assist                Ambulation/Gait Ambulation/Gait assistance: Contact guard assist Gait Distance (Feet): 80 Feet Assistive device: Rolling walker (2 wheels) Gait Pattern/deviations: Step-through pattern, Decreased stance time - right, Knee flexed in stance - right Gait velocity: reduced Gait velocity interpretation: <1.8 ft/sec, indicate of risk for recurrent falls   General Gait Details: pt with slowed step-through gait, initially with toe touch weightbearing on RLE but does progress to heel strike and increased weightbearing through RLE with PT verbal cueing. PT does note incomplete knee extension throughout stance phase but no knee buckling observed   Stairs             Wheelchair Mobility     Tilt Bed    Modified Rankin (Stroke Patients Only)       Balance Overall balance assessment: Needs assistance Sitting-balance support: No upper extremity supported, [name], Reliant on assistive device for balance Standing balance-Leahy Scale: Poor                              Communication Communication Communication: No apparent difficulties  Cognition Arousal: Alert Behavior During Therapy: WFL for tasks assessed/performed   PT - Cognitive impairments: No apparent impairments                         Following commands: Intact      Cueing Cueing Techniques: Verbal cues  Exercises Other Exercises Other  Exercises: PT encourages passive R knee extension by elevating distal leg on pillow or cushion Other Exercises: PT encourages quad sets    General Comments General comments (skin integrity, edema, etc.): VSS on RA      Pertinent Vitals/Pain Pain Assessment Pain Assessment: 0-10 Pain Score: 9  Pain Location: R knee Pain Descriptors / Indicators: Grimacing Pain Intervention(s): Monitored during session    Home Living                           Prior Function            PT Goals (current goals can now be found in the care plan section) Acute Rehab PT Goals Patient Stated Goal: get better Progress towards PT goals: Progressing toward goals    Frequency    Min 2X/week      PT Plan      Co-evaluation              AM-PAC PT "6 Clicks" Mobility   Outcome Measure  Help needed turning from your back to your side while in a flat bed without using bedrails?: None Help needed moving from lying on your back to sitting on the side of a flat bed without using bedrails?: A Little Help needed moving to and from a bed to a chair (including a wheelchair)?: A Little Help needed standing up from a chair using your arms (e.g., wheelchair or bedside chair)?: A Little Help needed to walk in hospital room?: A Little Help needed climbing 3-5 steps with a railing? : A Lot 6 Click Score: 18    End of Session Equipment Utilized During Treatment: Gait belt Activity Tolerance: Patient tolerated treatment well Patient left: in bed;with call bell/phone within reach;with bed alarm set Nurse Communication: Mobility status PT Visit Diagnosis: Unsteadiness on feet (R26.81);Pain Pain - Right/Left: Right Pain - part of body: Knee     Time: 1440-1457 PT Time Calculation (min) (ACUTE ONLY): 17 min  Charges:    $Gait Training: 8-22 mins PT General Charges $$ ACUTE PT VISIT: 1 Visit                     Rexie Catena, PT, DPT Acute Rehabilitation Office [phone]    Rexie Catena 07/04/2023, 4:07 PM

## 2023-07-04 NOTE — Progress Notes (Signed)
 Order placed for IV team blood draw

## 2023-07-05 ENCOUNTER — Other Ambulatory Visit (HOSPITAL_COMMUNITY): Payer: Self-pay

## 2023-07-05 DIAGNOSIS — B954 Other streptococcus as the cause of diseases classified elsewhere: Secondary | ICD-10-CM | POA: Diagnosis not present

## 2023-07-05 DIAGNOSIS — M869 Osteomyelitis, unspecified: Secondary | ICD-10-CM | POA: Diagnosis not present

## 2023-07-05 DIAGNOSIS — B9562 Methicillin resistant Staphylococcus aureus infection as the cause of diseases classified elsewhere: Secondary | ICD-10-CM | POA: Diagnosis not present

## 2023-07-05 DIAGNOSIS — T8453XA Infection and inflammatory reaction due to internal right knee prosthesis, initial encounter: Secondary | ICD-10-CM | POA: Diagnosis not present

## 2023-07-05 DIAGNOSIS — T8450XD Infection and inflammatory reaction due to unspecified internal joint prosthesis, subsequent encounter: Secondary | ICD-10-CM | POA: Diagnosis not present

## 2023-07-05 DIAGNOSIS — M86161 Other acute osteomyelitis, right tibia and fibula: Secondary | ICD-10-CM | POA: Diagnosis not present

## 2023-07-05 LAB — COMPREHENSIVE METABOLIC PANEL WITH GFR
ALT: 14 U/L (ref 0–44)
AST: 19 U/L (ref 15–41)
Albumin: 1.8 g/dL — ABNORMAL LOW (ref 3.5–5.0)
Alkaline Phosphatase: 71 U/L (ref 38–126)
Anion gap: 6 (ref 5–15)
BUN: 13 mg/dL (ref 6–20)
CO2: 28 mmol/L (ref 22–32)
Calcium: 8.2 mg/dL — ABNORMAL LOW (ref 8.9–10.3)
Chloride: 100 mmol/L (ref 98–111)
Creatinine, Ser: 0.62 mg/dL (ref 0.61–1.24)
GFR, Estimated: >60 mL/min (ref >60)
Glucose, Bld: 107 mg/dL — ABNORMAL HIGH (ref 70–99)
Potassium: 4.5 mmol/L (ref 3.5–5.1)
Sodium: 134 mmol/L — ABNORMAL LOW (ref 135–145)
Total Bilirubin: 1.2 mg/dL (ref 0.0–1.2)
Total Protein: 5.7 g/dL — ABNORMAL LOW (ref 6.5–8.1)

## 2023-07-05 LAB — CBC WITH DIFFERENTIAL/PLATELET
Abs Immature Granulocytes: 0.03 10*3/uL (ref 0.00–0.07)
Basophils Absolute: 0 10*3/uL (ref 0.0–0.1)
Basophils Relative: 0 %
Eosinophils Absolute: 0.1 10*3/uL (ref 0.0–0.5)
Eosinophils Relative: 2 %
HCT: 33.7 % — ABNORMAL LOW (ref 39.0–52.0)
Hemoglobin: 11.1 g/dL — ABNORMAL LOW (ref 13.0–17.0)
Immature Granulocytes: 0 %
Lymphocytes Relative: 10 %
Lymphs Abs: 0.7 10*3/uL (ref 0.7–4.0)
MCH: 28.1 pg (ref 26.0–34.0)
MCHC: 32.9 g/dL (ref 30.0–36.0)
MCV: 85.3 fL (ref 80.0–100.0)
Monocytes Absolute: 0.7 10*3/uL (ref 0.1–1.0)
Monocytes Relative: 10 %
Neutro Abs: 5.3 10*3/uL (ref 1.7–7.7)
Neutrophils Relative %: 78 %
Platelets: 211 10*3/uL (ref 150–400)
RBC: 3.95 MIL/uL — ABNORMAL LOW (ref 4.22–5.81)
RDW: 16.9 % — ABNORMAL HIGH (ref 11.5–15.5)
WBC: 6.9 10*3/uL (ref 4.0–10.5)
nRBC: 0 % (ref 0.0–0.2)

## 2023-07-05 LAB — PHOSPHORUS: Phosphorus: 3 mg/dL (ref 2.5–4.6)

## 2023-07-05 LAB — MAGNESIUM: Magnesium: 1.8 mg/dL (ref 1.7–2.4)

## 2023-07-05 MED ORDER — MAGNESIUM SULFATE 2 GM/50ML IV SOLN: 2.0000 g | Freq: Once | INTRAVENOUS | Status: DC

## 2023-07-05 MED ORDER — MAGNESIUM OXIDE -MG SUPPLEMENT 400 (240 MG) MG PO TABS
800.0000 mg | ORAL_TABLET | Freq: Once | ORAL | Status: AC
  Administered 2023-07-05: 800 mg via ORAL
  Filled 2023-07-05: qty 2

## 2023-07-05 MED ORDER — LINEZOLID 600 MG PO TABS
600.0000 mg | ORAL_TABLET | Freq: Two times a day (BID) | ORAL | Status: DC
  Administered 2023-07-05 – 2023-07-07 (×5): 600 mg via ORAL
  Filled 2023-07-05 (×5): qty 1

## 2023-07-05 NOTE — Progress Notes (Signed)
 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 Spoke with IV team and IR and not a candidate for additional vascular access given the risk of continuing to remove access. Discussed the plan of care to change to oral linezolid  600 mg po BID. There is concern with his ability to take medication as prescribed and will need therapeutic drug monitoring of platelets. Attempting to determine coverage of tedizolid given the duration of treatment and resistance to Bactrim and Doxycycline . Disposition may be to skilled facility which would be good for direct observed therapy. Continue contact precautions for MRSA infection. Post-operative wound care per Orthopedics. Remaining medical and supportive care per Internal Medicine.   PLAN:  Change antibiotics to linezolid  600 mg PO bid.  Therapeutic drug monitoring of platelets while on linezolid  Post-operative wound care per Orthopedics.  Continue contact precautions for MRSA infection.  Remaining medical and supportive care per Internal Medicine.   Principal Problem:   Prosthetic joint infection, subsequent encounter Active Problems:   Osteomyelitis (HCC)    aspirin   325 mg Oral Daily   diltiazem   180 mg Oral Daily   docusate sodium   100 mg Oral BID   linezolid   600 mg Oral Q12H   metoprolol  succinate  50 mg Oral Daily   sodium chloride  flush  10-40 mL Intracatheter Q12H    SUBJECTIVE:  Afebrile overnight with no acute events. Vascular access has been lost. Tolerating antibiotics. No fever, chills, or sweats. Wanting to get better.   Allergies  Allergen Reactions   Tylenol  [Acetaminophen ] Other (See Comments)    Impacts liver     Review of Systems: Review of Systems   Constitutional:  Negative for chills, fever and weight loss.  Respiratory:  Negative for cough, shortness of breath and wheezing.   Cardiovascular:  Negative for chest pain and leg swelling.  Gastrointestinal:  Negative for abdominal pain, constipation, diarrhea, nausea and vomiting.  Musculoskeletal:        Positive for leg pain   Skin:  Negative for rash.      OBJECTIVE: Vitals:   07/04/23 1620 07/04/23 2039 07/05/23 0816 07/05/23 0852  BP: 103/61 (!) 141/66 (!) (P) 143/79 (!) 143/79  Pulse: (!) 53 (!) 58 (P) 73 73  Resp: 17 17 (P) 17   Temp: 97.8 F (36.6 C) 98.1 F (36.7 C) (P) 98.1 F (36.7 C)   TempSrc: Oral Oral (P) Oral   SpO2: 100% 98% 98%   Weight:      Height:       Body mass index is 33.64 kg/m.  Physical Exam Constitutional:      General: He is not in acute distress.    Appearance: He is well-developed.  Cardiovascular:     Rate and Rhythm: Regular rhythm. Bradycardia present.     Heart sounds: Normal heart sounds.  Pulmonary:     Effort: Pulmonary effort is normal.     Breath sounds: Normal breath sounds.  Skin:    General: Skin is warm and dry.  Neurological:     Mental Status: He is alert.     Lab Results Lab Results  Component Value Date   WBC 6.9 07/05/2023   HGB 11.1 (L) 07/05/2023  HCT 33.7 (L) 07/05/2023   MCV 85.3 07/05/2023   PLT 211 07/05/2023    Lab Results  Component Value Date   CREATININE 0.62 07/05/2023   BUN 13 07/05/2023   NA 134 (L) 07/05/2023   K 4.5 07/05/2023   CL 100 07/05/2023   CO2 28 07/05/2023    Lab Results  Component Value Date   ALT 14 07/05/2023   AST 19 07/05/2023   ALKPHOS 71 07/05/2023   BILITOT 1.2 07/05/2023     Microbiology: Recent Results (from the past 240 hours)  Blood Culture (routine x 2)     Status: None   Collection Time: 06/27/23  2:16 AM   Specimen: BLOOD  Result Value Ref Range Status   Specimen Description BLOOD SITE NOT SPECIFIED  Final   Special Requests   Final    BOTTLES  DRAWN AEROBIC AND ANAEROBIC Blood Culture adequate volume   Culture   Final    NO GROWTH 5 DAYS Performed at [facility], [address], [place], [place] [address]    Report Status 07/02/2023 FINAL  Final  Blood Culture (routine x 2)     Status: None   Collection Time: 06/27/23  2:25 AM   Specimen: BLOOD  Result Value Ref Range Status   Specimen Description BLOOD SITE NOT SPECIFIED  Final   Special Requests   Final    BOTTLES DRAWN AEROBIC AND ANAEROBIC Blood Culture adequate volume   Culture   Final    NO GROWTH 5 DAYS Performed at [facility], [address] [place], [place] [address]    Report Status 07/02/2023 FINAL  Final  Aerobic Culture w Gram Stain (superficial specimen)     Status: None   Collection Time: 06/27/23  3:27 AM   Specimen: Leg  Result Value Ref Range Status   Specimen Description LEG RIGHT  Final   Special Requests NONE  Final   Gram Stain NO WBC SEEN RARE GRAM POSITIVE COCCI IN SINGLES   Final   Culture   Final    MODERATE METHICILLIN RESISTANT STAPHYLOCOCCUS AUREUS FEW STREPTOCOCCUS GROUP G Beta hemolytic streptococci are predictably susceptible to penicillin  and other beta lactams. Susceptibility testing not routinely performed. Performed at [facility], [name] [place], [place] [address]    Report Status 06/29/2023 FINAL  Final   Organism ID, Bacteria METHICILLIN RESISTANT STAPHYLOCOCCUS AUREUS  Final      Susceptibility   Methicillin resistant staphylococcus aureus - MIC*    CIPROFLOXACIN >=8 RESISTANT Resistant     ERYTHROMYCIN >=8 RESISTANT Resistant     GENTAMICIN  <=0.5 SENSITIVE Sensitive     OXACILLIN >=4 RESISTANT Resistant     TETRACYCLINE <=1 SENSITIVE Sensitive     VANCOMYCIN  1 SENSITIVE Sensitive     TRIMETH/SULFA >=320 RESISTANT Resistant     CLINDAMYCIN  <=0.25 SENSITIVE Sensitive     RIFAMPIN <=0.5 SENSITIVE Sensitive     Inducible Clindamycin  NEGATIVE Sensitive     LINEZOLID  2 SENSITIVE Sensitive      * MODERATE METHICILLIN RESISTANT STAPHYLOCOCCUS AUREUS  Surgical PCR screen     Status: Abnormal   Collection Time: 06/27/23  4:03 PM   Specimen: Nasal Mucosa; Nasal Swab  Result Value Ref Range Status   MRSA, PCR POSITIVE (A) NEGATIVE Final    Comment: RESULT CALLED TO, READ BACK BY AND VERIFIED WITH: RN CLINT WOOTEN ON 06/27/23 @ 1818 BY DRT    Staphylococcus aureus POSITIVE (A) NEGATIVE Final  Comment: (NOTE) The Xpert SA Assay (FDA approved for NASAL specimens in patients 32 years of age and older), is one component of a comprehensive surveillance program. It is not intended to diagnose infection nor to guide or monitor treatment. Performed at [facility], [address], [place], [place] [address]   Aerobic/Anaerobic Culture w Gram Stain (surgical/deep wound)     Status: None   Collection Time: 06/29/23 10:39 AM   Specimen: Leg, Right; Abscess  Result Value Ref Range Status   Specimen Description TISSUE  Final   Special Requests A SOFT TIS ABSCESS  Final   Gram Stain   Final    ABUNDANT WBC PRESENT, PREDOMINANTLY PMN NO ORGANISMS SEEN    Culture   Final    RARE METHICILLIN RESISTANT STAPHYLOCOCCUS AUREUS NO ANAEROBES ISOLATED Performed at [facility], [address], [place], [place] [address]    Report Status 07/04/2023 FINAL  Final   Organism ID, Bacteria METHICILLIN RESISTANT STAPHYLOCOCCUS AUREUS  Final      Susceptibility   Methicillin resistant staphylococcus aureus - MIC*    CIPROFLOXACIN >=8 RESISTANT Resistant     ERYTHROMYCIN >=8 RESISTANT Resistant     GENTAMICIN  <=0.5 SENSITIVE Sensitive     OXACILLIN >=4 RESISTANT Resistant     TETRACYCLINE >=16 RESISTANT Resistant     VANCOMYCIN  1 SENSITIVE Sensitive     TRIMETH/SULFA >=320 RESISTANT Resistant     CLINDAMYCIN  <=0.25 SENSITIVE Sensitive     RIFAMPIN <=0.5 SENSITIVE Sensitive     Inducible Clindamycin  NEGATIVE Sensitive     LINEZOLID  2 SENSITIVE Sensitive     * RARE METHICILLIN RESISTANT  STAPHYLOCOCCUS AUREUS  Aerobic/Anaerobic Culture w Gram Stain (surgical/deep wound)     Status: None   Collection Time: 06/29/23 10:41 AM   Specimen: Leg, Right; Abscess  Result Value Ref Range Status   Specimen Description ABSCESS  Final   Special Requests B  Final   Gram Stain   Final    ABUNDANT WBC PRESENT, PREDOMINANTLY PMN NO ORGANISMS SEEN    Culture   Final    RARE METHICILLIN RESISTANT STAPHYLOCOCCUS AUREUS NO ANAEROBES ISOLATED Performed at [facility], [address] [place], [place] [address]    Report Status 07/04/2023 FINAL  Final   Organism ID, Bacteria METHICILLIN RESISTANT STAPHYLOCOCCUS AUREUS  Final      Susceptibility   Methicillin resistant staphylococcus aureus - MIC*    CIPROFLOXACIN >=8 RESISTANT Resistant     ERYTHROMYCIN >=8 RESISTANT Resistant     GENTAMICIN  <=0.5 SENSITIVE Sensitive     OXACILLIN >=4 RESISTANT Resistant     TETRACYCLINE >=16 RESISTANT Resistant     VANCOMYCIN  1 SENSITIVE Sensitive     TRIMETH/SULFA >=320 RESISTANT Resistant     CLINDAMYCIN  <=0.25 SENSITIVE Sensitive     RIFAMPIN <=0.5 SENSITIVE Sensitive     Inducible Clindamycin  NEGATIVE Sensitive     LINEZOLID  2 SENSITIVE Sensitive     * RARE METHICILLIN RESISTANT STAPHYLOCOCCUS AUREUS  Aerobic/Anaerobic Culture w Gram Stain (surgical/deep wound)     Status: None   Collection Time: 06/29/23 10:41 AM   Specimen: Leg, Right; Abscess  Result Value Ref Range Status   Specimen Description ABSCESS  Final   Special Requests C  Final   Gram Stain   Final    ABUNDANT WBC PRESENT, PREDOMINANTLY PMN NO ORGANISMS SEEN    Culture   Final    No growth aerobically or anaerobically. Performed at [facility], [address], [place], [place] [address]    Report Status 07/04/2023 FINAL  Final  MIC (1 Drug)-Abscess; 06/29/2023; Leg, Right; Staph aureus; Daptomycin      Status: None   Collection Time: 06/29/23 12:41 PM   Specimen: Leg, Right  Result Value Ref Range Status    Min Inhibitory Conc (1 Drug) Preliminary report  Final    Comment: (NOTE) Performed At: [facility] [address] [place], [place] [id number] Pearlean Botts MD VO:[id number]    Source ABSCESS  Final    Comment: Performed at [facility], [address] [place], [place] [address]  MIC Result     Status: None   Collection Time: 06/29/23 12:41 PM  Result Value Ref Range Status   Result 1 (MIC) Staphylococcus aureus  Final    Comment: (NOTE) Identification performed by account, not confirmed by this laboratory. Performed At: [facility] [address] [place], [place] [id number] Pearlean Botts MD IR:[id number]      Marlan Silva, NP [facility]  07/05/2023  2:48 PM

## 2023-07-05 NOTE — Progress Notes (Signed)
 magnesium  sulfate IVPB 2 g 50 mL ordered. Pt still has no IV access. Sheikh, DO made aware

## 2023-07-05 NOTE — Plan of Care (Signed)

## 2023-07-05 NOTE — Progress Notes (Signed)
 PROGRESS NOTE    Jonathan Ibarra  VWU:[id number] DOB: 10/09/1965 DOA: 06/26/2023 PCP: Sharry Deem, MD   Brief Narrative:  Jonathan Ibarra is a 58 y.o. male with past medical history significant for tibial plateau fracture, right knee septic arthritis with hardware removal, I&D, wound VAC placement February 2025, HTN, SVT, paroxysmal atrial fibrillation not on anticoagulation, cirrhosis s/p TIPS, chronic thrombocytopenia, anemia of chronic medical disease, polysubstance abuse who presented to [facility] ED on 06/26/2023 with right knee pain, swelling, drainage.   MRI R knee w/o contrast showed enlarging complex knee joint effusion w/ diffuse periarticular marrow edema suspicious for septic arthritis and osteomyelitis, probable associated intraosseous abscess proximal tibia with fistulous tract communicating with a lateral soft tissue component, extensive periarticular soft tissue edema surrounding the knee. Blood cultures x 2 and Negative.  Patient was started on a Cardizem  drip for A-fib with RVR and HR improved.  Orthopedics consulted and ID consulted.   He underwent I&D and is POD day 6 with I&D. PT/OT recommending SNF. ID was recommending 4 weeks of IV Vancomycin  (in hospital) then Oritavancin  to complete course w/ then with chronic oral Abx for suppression. However patient continued to pull out multiple IV lines so on 07/05/23 was change to oral Linezolid  600 twice daily.  From a medical standpoint now that he does not need to remain in the hospital for IV Vancomycin , he can be discharged to SNF.  Assessment and Plan:   Right knee septic arthritis with abscess with Concern for osteomyelitis: Had a 1 week history of progressive right knee swelling, erythema, pain with purulent discharge.  Noncompliant with outpatient therapy including IV antibiotics and follow-up with ID. 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  Patient is afebrile without  leukocytosis.  MR right knee with findings consistent with acute septic arthritis with concern for osteomyelitis and abscess formation.  Aaron Aas CRP was 8.7. Blood cultures x 2: No growth x 5 days;  Superficial leg culture: + MRSA;  Pain Regimen: Pain control w/ Oxycodone  15 mg p.o. every 4 hours as needed severe pain, Robaxin  750 mg p.o. every 8 hours as needed muscle spasms, and Hydromorphone  1 mg IV every 4 hours as needed severe pain not relieved with oral medication. -Seen by Orthopedics, Dr. Curtiss Dowdy who initially recommended AKA, but patient declined and underwent planned excision of intraosseous abscess R Proximal Tibia and I&D of R Leg Abscess and Placement of Abx Beads Intraosseously on 4/30. Operative cultures are growing 1 out of 3 Staph aureus.  -ID consulted and Was to be on at least 4 weeks of IV Abx with Vancomycin  with Transition to Oritavancin  to complete Course of Abx and then oral Abx for Suppression. ID had originally recommended that he remain in the hospital given his history of drug use and then go to SNF (PT/OT reccs) however given that he continues to pull out multiple lines. 07/05/23 ID now changed their recommendations to just oral Linezolid  600 mg BID. He can go to SNF now that he no longer needing IV Vancomycin  and a line and will need close ID follow-up in 2 weeks  Paroxysmal Atrial Fibrillation with RVR  Essential HTN: Patient presenting with elevated heart rate of 153.  Initially placed on Cardizem  drip.  Home medications restarted and was able to be titrated off.  Not on anticoagulation outpatient. C/w Diltiazem  180 mg p.o. daily and Metoprolol  Succinate 50 mg p.o. daily. HR remains well-controlled now, discontinued telemetry. CTM BP per  protocol. Last BP reading was 136/86.   Cirrhosis s/p TIPS: C/w 2 gram Low-salt diet. Will need  Outpatient follow-up with GI. Bilirubin was elevated at 1.5 on last check but LFTs normal. CTM and Trend and watch for decompensation. PT-INR was 16.0-1.3  respectively on last check on 5 1   Anemia of Chronic Medical Disease: Hgb/Hct relatively stable but did drop from 12.9/39.6 -> 11.1/33.7. Anemia Panel: Iron level 50, UIBC 174, TIBC 224, saturation ratios of 22%, ferritin of 83, folate level 8.6 and vitamin B12 578.  CTM for S/Sx of Bleeding; No overt bleeding noted. Repeat CBC in the AM   Hyponatremia: Mild. Na+ ranging from 131-134 and was 134 today. CTM and trend and Repeat CMP in the AM  Hyperkalemia: K+ was 5.4 and Given 1x dose of 10 grams of Lokelama; Now 4.5 today.   Polysubstance use: UDS positive for amphetamines, opiates, THC. Counseled on need for complete abstinence/cessation from illicit substances.  Hyperbilirubinemia: Fluctuating. Ranging between 0.9-1.3.  Today was 1.2.  CTM and Trend and repeat CMP in the AM  Hypoalbuminemia: Patient's Albumin is now trended down to 1.8. CTM and Trend and repeat CMP in the AM  Class I Obesity: Complicates overall prognosis and care. Estimated body mass index is 33.64 kg/m as calculated from the following:   Height as of this encounter: 6\' 1"  (1.854 m).   Weight as of this encounter: 115.7 kg. Weight Loss and Dietary Counseling given   DVT prophylaxis: SCDs Start: 06/29/23 1203    Code Status: Full Code Family Communication: No family present @ bedside  Disposition Plan:  Level of care: Med-Surg Status is: Inpatient Remains inpatient appropriate because: Medically stable to go to SNF now no longer requiring IV Vancomycin  in hospital    Consultants:  Orthopedic Surgery ID  Procedures:  Procedures: CPT 27360-Excision of intraosseous abscess right proximal tibia CPT 27603-Incision and drainage of right leg abscess CPT 11981-Placement of antibiotic beads intraosseously  Antimicrobials:  Anti-infectives (From admission, onward)    Start     Dose/Rate Route Frequency Ordered Stop   07/05/23 1000  linezolid  (ZYVOX ) tablet 600 mg        600 mg Oral Every 12 hours 07/05/23 0907      06/29/23 2200  vancomycin  (VANCOCIN ) IVPB 1000 mg/200 mL premix  Status:  Discontinued        1,000 mg 200 mL/hr over 60 Minutes Intravenous Every 12 hours 06/29/23 2010 07/05/23 0907   06/29/23 1038  gentamicin  (GARAMYCIN ) injection  Status:  Discontinued          As needed 06/29/23 1038 06/29/23 1108   06/29/23 1029  tobramycin  (NEBCIN ) powder  Status:  Discontinued          As needed 06/29/23 1029 06/29/23 1108   06/29/23 1029  vancomycin  (VANCOCIN ) powder  Status:  Discontinued          As needed 06/29/23 1030 06/29/23 1108   06/29/23 0845  ceFAZolin  (ANCEF ) IVPB 2g/100 mL premix        2 g 200 mL/hr over 30 Minutes Intravenous On call to O.R. 06/29/23 0837 06/29/23 1018   06/29/23 0805  ceFAZolin  (ANCEF ) 2-4 GM/100ML-% IVPB       Note to Pharmacy: Emeterio Hansen, GRETA: cabinet override      06/29/23 0805 06/29/23 1019   06/27/23 1800  vancomycin  (VANCOREADY) IVPB 1500 mg/300 mL  Status:  Discontinued        1,500 mg 150 mL/hr over 120 Minutes Intravenous Every 12  hours 06/27/23 1429 06/29/23 2010   06/27/23 0315  cefTRIAXone  (ROCEPHIN ) 2 g in sodium chloride  0.9 % 100 mL IVPB        2 g 200 mL/hr over 30 Minutes Intravenous Once 06/27/23 0313 06/27/23 0512   06/27/23 0315  vancomycin  (VANCOCIN ) IVPB 1000 mg/200 mL premix  Status:  Discontinued        1,000 mg 200 mL/hr over 60 Minutes Intravenous  Once 06/27/23 0313 06/27/23 0314   06/27/23 0315  vancomycin  (VANCOREADY) IVPB 2000 mg/400 mL        2,000 mg 200 mL/hr over 120 Minutes Intravenous  Once 06/27/23 0314 06/27/23 0701       Subjective: Seen and examined at bedside and he is apologetic for about pulling his lines out and states "I do not know what happens".  Now the plan of care is changed so that he will be on oral Abx with just Linezolid . States he was ambulating to the Restroom but still having some pain in his knee. Also complaining of abdominal gaseous pain.   Objective: Vitals:   07/04/23 2039 07/05/23 0816 07/05/23  0852 07/05/23 1622  BP: (!) 141/66 (!) 143/79 (!) 143/79 136/86  Pulse: (!) 58 73 73 (!) 57  Resp: 17 17  17   Temp: 98.1 F (36.7 C) 98.1 F (36.7 C)    TempSrc: Oral Oral    SpO2: 98% 98%  100%  Weight:      Height:        Intake/Output Summary (Last 24 hours) at 07/05/2023 1807 Last data filed at 07/05/2023 1300 Gross per 24 hour  Intake 240 ml  Output 1800 ml  Net -1560 ml   Filed Weights   06/26/23 2340  Weight: 115.7 kg   Examination: Physical Exam:  Constitutional: WN/WD chronically ill-appearing Caucasian male who appears calm Respiratory: Diminished to auscultation bilaterally with some coarse breath sounds, no wheezing, rales, rhonchi or crackles. Normal respiratory effort and patient is not tachypenic. No accessory muscle use.  Unlabored breathing Cardiovascular: RRR, no murmurs / rubs / gallops. S1 and S2 auscultated.   Abdomen: Soft, a little-tender to palpate, distended secondary to body habitus. Bowel sounds positive.  GU: Deferred. Musculoskeletal: No clubbing / cyanosis of digits/nails. No joint deformity upper and lower extremities. Skin: Right knee is covered and wrapped and he has multiple tattoos diffusely scattered throughout his body Neurologic: CN 2-12 grossly intact with no focal deficits. Romberg sign cerebellar reflexes not assessed.  Psychiatric: Normal judgment and insight. Alert and oriented x 3.   Data Reviewed: I have personally reviewed following labs and imaging studies  CBC: Recent Labs  Lab 07/01/23 1241 07/02/23 0406 07/03/23 0201 07/04/23 1053 07/05/23 0645  WBC 10.5 8.7 8.2 8.7 6.9  NEUTROABS 8.4* 6.3 5.4 6.3 5.3  HGB 12.4* 11.5* 11.1* 12.9* 11.1*  HCT 38.0* 35.5* 34.2* 39.6 33.7*  MCV 85.8 86.6 87.9 88.0 85.3  PLT 371 331 274 328 211   Basic Metabolic Panel: Recent Labs  Lab 07/01/23 1241 07/02/23 0406 07/03/23 0201 07/04/23 1053 07/05/23 0645  NA 132* 132* 134* 132* 134*  K 5.2* 4.1 4.1 5.4* 4.5  CL 101 101 102 100  100  CO2 25 25 27 28 28   GLUCOSE 134* 132* 129* 153* 107*  BUN 20 20 17 15 13   CREATININE 0.72 0.69 0.70 0.73 0.62  CALCIUM  9.4 8.7* 8.4* 9.0 8.2*  MG 1.9 1.7 1.8 1.8 1.8  PHOS 3.3 3.1 3.1 3.1 3.0   GFR: Estimated Creatinine  Clearance: 137.4 mL/min (by C-G formula based on SCr of 0.62 mg/dL). Liver Function Tests: Recent Labs  Lab 07/01/23 1241 07/02/23 0406 07/03/23 0201 07/04/23 1053 07/05/23 0645  AST 24 23 22 26 19   ALT 13 12 14 17 14   ALKPHOS 71 68 77 92 71  BILITOT 1.3* 1.2 0.9 1.3* 1.2  PROT 7.1 6.3* 5.8* 6.9 5.7*  ALBUMIN 2.2* 2.0* 1.8* 2.3* 1.8*   No results for input(s): "LIPASE", "AMYLASE" in the last 168 hours. Recent Labs  Lab 06/30/23 0707  AMMONIA 55*   Coagulation Profile: Recent Labs  Lab 06/30/23 0707  INR 1.3*   Cardiac Enzymes: No results for input(s): "CKTOTAL", "CKMB", "CKMBINDEX", "TROPONINI" in the last 168 hours. BNP (last 3 results) No results for input(s): "PROBNP" in the last 8760 hours. HbA1C: No results for input(s): "HGBA1C" in the last 72 hours. CBG: No results for input(s): "GLUCAP" in the last 168 hours. Lipid Profile: No results for input(s): "CHOL", "HDL", "LDLCALC", "TRIG", "CHOLHDL", "LDLDIRECT" in the last 72 hours. Thyroid Function Tests: No results for input(s): "TSH", "T4TOTAL", "FREET4", "T3FREE", "THYROIDAB" in the last 72 hours. Anemia Panel: Recent Labs    07/04/23 1053  VITAMINB12 578  FOLATE 8.6  FERRITIN 83  TIBC 224*  IRON 50  RETICCTPCT 2.2   Sepsis Labs: No results for input(s): "PROCALCITON", "LATICACIDVEN" in the last 168 hours.  Recent Results (from the past 240 hours)  Blood Culture (routine x 2)     Status: None   Collection Time: 06/27/23  2:16 AM   Specimen: BLOOD  Result Value Ref Range Status   Specimen Description BLOOD SITE NOT SPECIFIED  Final   Special Requests   Final    BOTTLES DRAWN AEROBIC AND ANAEROBIC Blood Culture adequate volume   Culture   Final    NO GROWTH 5  DAYS Performed at [facility], [address], [place], [place] [address]    Report Status 07/02/2023 FINAL  Final  Blood Culture (routine x 2)     Status: None   Collection Time: 06/27/23  2:25 AM   Specimen: BLOOD  Result Value Ref Range Status   Specimen Description BLOOD SITE NOT SPECIFIED  Final   Special Requests   Final    BOTTLES DRAWN AEROBIC AND ANAEROBIC Blood Culture adequate volume   Culture   Final    NO GROWTH 5 DAYS Performed at [facility], [address] [place], [place] [address]    Report Status 07/02/2023 FINAL  Final  Aerobic Culture w Gram Stain (superficial specimen)     Status: None   Collection Time: 06/27/23  3:27 AM   Specimen: Leg  Result Value Ref Range Status   Specimen Description LEG RIGHT  Final   Special Requests NONE  Final   Gram Stain NO WBC SEEN RARE GRAM POSITIVE COCCI IN SINGLES   Final   Culture   Final    MODERATE METHICILLIN RESISTANT STAPHYLOCOCCUS AUREUS FEW STREPTOCOCCUS GROUP G Beta hemolytic streptococci are predictably susceptible to penicillin  and other beta lactams. Susceptibility testing not routinely performed. Performed at [facility], [address], [place], [place] [address]    Report Status 06/29/2023 FINAL  Final   Organism ID, Bacteria METHICILLIN RESISTANT STAPHYLOCOCCUS AUREUS  Final      Susceptibility   Methicillin resistant staphylococcus aureus - MIC*    CIPROFLOXACIN >=8 RESISTANT Resistant     ERYTHROMYCIN >=8 RESISTANT Resistant     GENTAMICIN  <=0.5 SENSITIVE Sensitive  OXACILLIN >=4 RESISTANT Resistant     TETRACYCLINE <=1 SENSITIVE Sensitive     VANCOMYCIN  1 SENSITIVE Sensitive     TRIMETH/SULFA >=320 RESISTANT Resistant     CLINDAMYCIN  <=0.25 SENSITIVE Sensitive     RIFAMPIN <=0.5 SENSITIVE Sensitive     Inducible Clindamycin  NEGATIVE Sensitive     LINEZOLID  2 SENSITIVE Sensitive     * MODERATE METHICILLIN RESISTANT STAPHYLOCOCCUS AUREUS  Surgical PCR screen     Status:  Abnormal   Collection Time: 06/27/23  4:03 PM   Specimen: Nasal Mucosa; Nasal Swab  Result Value Ref Range Status   MRSA, PCR POSITIVE (A) NEGATIVE Final    Comment: RESULT CALLED TO, READ BACK BY AND VERIFIED WITH: RN CLINT WOOTEN ON 06/27/23 @ 1818 BY DRT    Staphylococcus aureus POSITIVE (A) NEGATIVE Final    Comment: (NOTE) The Xpert SA Assay (FDA approved for NASAL specimens in patients 70 years of age and older), is one component of a comprehensive surveillance program. It is not intended to diagnose infection nor to guide or monitor treatment. Performed at [facility], [address], [place], [place] [address]   Aerobic/Anaerobic Culture w Gram Stain (surgical/deep wound)     Status: None   Collection Time: 06/29/23 10:39 AM   Specimen: Leg, Right; Abscess  Result Value Ref Range Status   Specimen Description TISSUE  Final   Special Requests A SOFT TIS ABSCESS  Final   Gram Stain   Final    ABUNDANT WBC PRESENT, PREDOMINANTLY PMN NO ORGANISMS SEEN    Culture   Final    RARE METHICILLIN RESISTANT STAPHYLOCOCCUS AUREUS NO ANAEROBES ISOLATED Performed at [facility], [address], [place], [place] [address]    Report Status 07/04/2023 FINAL  Final   Organism ID, Bacteria METHICILLIN RESISTANT STAPHYLOCOCCUS AUREUS  Final      Susceptibility   Methicillin resistant staphylococcus aureus - MIC*    CIPROFLOXACIN >=8 RESISTANT Resistant     ERYTHROMYCIN >=8 RESISTANT Resistant     GENTAMICIN  <=0.5 SENSITIVE Sensitive     OXACILLIN >=4 RESISTANT Resistant     TETRACYCLINE >=16 RESISTANT Resistant     VANCOMYCIN  1 SENSITIVE Sensitive     TRIMETH/SULFA >=320 RESISTANT Resistant     CLINDAMYCIN  <=0.25 SENSITIVE Sensitive     RIFAMPIN <=0.5 SENSITIVE Sensitive     Inducible Clindamycin  NEGATIVE Sensitive     LINEZOLID  2 SENSITIVE Sensitive     * RARE METHICILLIN RESISTANT STAPHYLOCOCCUS AUREUS  Aerobic/Anaerobic Culture w Gram Stain (surgical/deep wound)      Status: None   Collection Time: 06/29/23 10:41 AM   Specimen: Leg, Right; Abscess  Result Value Ref Range Status   Specimen Description ABSCESS  Final   Special Requests B  Final   Gram Stain   Final    ABUNDANT WBC PRESENT, PREDOMINANTLY PMN NO ORGANISMS SEEN    Culture   Final    RARE METHICILLIN RESISTANT STAPHYLOCOCCUS AUREUS NO ANAEROBES ISOLATED Performed at [facility], [address], [place], [place] [address]    Report Status 07/04/2023 FINAL  Final   Organism ID, Bacteria METHICILLIN RESISTANT STAPHYLOCOCCUS AUREUS  Final      Susceptibility   Methicillin resistant staphylococcus aureus - MIC*    CIPROFLOXACIN >=8 RESISTANT Resistant     ERYTHROMYCIN >=8 RESISTANT Resistant     GENTAMICIN  <=0.5 SENSITIVE Sensitive     OXACILLIN >=4 RESISTANT Resistant     TETRACYCLINE >=16 RESISTANT Resistant  VANCOMYCIN  1 SENSITIVE Sensitive     TRIMETH/SULFA >=320 RESISTANT Resistant     CLINDAMYCIN  <=0.25 SENSITIVE Sensitive     RIFAMPIN <=0.5 SENSITIVE Sensitive     Inducible Clindamycin  NEGATIVE Sensitive     LINEZOLID  2 SENSITIVE Sensitive     * RARE METHICILLIN RESISTANT STAPHYLOCOCCUS AUREUS  Aerobic/Anaerobic Culture w Gram Stain (surgical/deep wound)     Status: None   Collection Time: 06/29/23 10:41 AM   Specimen: Leg, Right; Abscess  Result Value Ref Range Status   Specimen Description ABSCESS  Final   Special Requests C  Final   Gram Stain   Final    ABUNDANT WBC PRESENT, PREDOMINANTLY PMN NO ORGANISMS SEEN    Culture   Final    No growth aerobically or anaerobically. Performed at [facility], [address], [place], [place] [address]    Report Status 07/04/2023 FINAL  Final  MIC (1 Drug)-Abscess; 06/29/2023; Leg, Right; Staph aureus; Daptomycin      Status: None   Collection Time: 06/29/23 12:41 PM   Specimen: Leg, Right  Result Value Ref Range Status   Min Inhibitory Conc (1 Drug) Preliminary report  Final    Comment: (NOTE) Performed At:  [facility] [address] [place], [place] [id number] Pearlean Botts MD [id number]    Source ABSCESS  Final    Comment: Performed at [facility], [address], [place], [place] [address]  MIC Result     Status: None   Collection Time: 06/29/23 12:41 PM  Result Value Ref Range Status   Result 1 (MIC) Staphylococcus aureus  Final    Comment: (NOTE) Identification performed by account, not confirmed by this laboratory. Performed At: [facility] [address] [place], [place] [id number] Pearlean Botts MD [id number]     Radiology Studies: No results found.  Scheduled Meds:  aspirin   325 mg Oral Daily   diltiazem   180 mg Oral Daily   docusate sodium   100 mg Oral BID   linezolid   600 mg Oral Q12H   metoprolol  succinate  50 mg Oral Daily   sodium chloride  flush  10-40 mL Intracatheter Q12H   Continuous Infusions:  magnesium  sulfate bolus IVPB      LOS: 8 days   Aura Leeds, DO [facility] Available via Epic secure chat 7am-7pm After these hours, please refer to coverage provider listed on [email or web address] 07/05/2023, 6:07 PM

## 2023-07-06 DIAGNOSIS — T8450XD Infection and inflammatory reaction due to unspecified internal joint prosthesis, subsequent encounter: Secondary | ICD-10-CM | POA: Diagnosis not present

## 2023-07-06 LAB — MINIMUM INHIBITORY CONC. (1 DRUG)

## 2023-07-06 LAB — MIC RESULT

## 2023-07-06 MED ORDER — ALUM & MAG HYDROXIDE-SIMETH 200-200-20 MG/5ML PO SUSP
30.0000 mL | ORAL | Status: DC | PRN
  Administered 2023-07-06: 30 mL via ORAL
  Filled 2023-07-06: qty 30

## 2023-07-06 MED ORDER — OXYCODONE HCL 5 MG PO TABS
15.0000 mg | ORAL_TABLET | ORAL | Status: DC | PRN
  Administered 2023-07-06 – 2023-07-07 (×7): 20 mg via ORAL
  Filled 2023-07-06 (×7): qty 4

## 2023-07-06 MED ORDER — HYDROMORPHONE HCL 1 MG/ML IJ SOLN: 1.0000 mg | INTRAMUSCULAR | Status: DC | PRN

## 2023-07-06 NOTE — TOC Progression Note (Addendum)
 Transition of Care [facility]) - Progression Note    Patient Details  Name: Jonathan Ibarra MRN: [id number] Date of Birth: 09/25/1965  Transition of Care [facility]) CM/SW Contact  Arron Big, [place] Phone Number: 07/06/2023, 2:23 PM  Clinical Narrative:   CSW left VM for admissions at [facility] to confirm bed availability and discuss discharge for patient. Awaiting call back.   2:52 PM CSW followed up with [place], who works with [employer], about patients disposition. Per [place], facility can accept patient Friday pending insurance auth approval. Facility will start insurance auth. [place] confirmed that for [facility] Medicaid patients they do not need disability for STR.   Plan: - Potential DC to [facility] in [place] 5/9 depending on insurance auth approval.   TOC will continue to follow.    Expected Discharge Plan: Skilled Nursing Facility Barriers to Discharge: English as a second language teacher, Continued Medical Work up  Expected Discharge Plan and Services   Discharge Planning Services: CM Consult Post Acute Care Choice:  (await PT eval post op) Living arrangements for the past 2 months: Single Family Home                 DME Arranged:  (await post op eval)         HH Arranged:  (see note)           Social Determinants of Health (SDOH) Interventions SDOH Screenings   Food Insecurity: Food Insecurity Present (06/27/2023)  Housing: High Risk (06/27/2023)  Transportation Needs: Unmet Transportation Needs (06/27/2023)  Utilities: Not At Risk (06/27/2023)  Financial Resource Strain: Medium Risk (01/07/2020)   Received from [facility], [facility]  Social Connections: Unknown (07/05/2021)   Received from [facility], [facility]  Stress: No Stress Concern Present (01/07/2020)   Received from [facility], [facility]  Tobacco Use: High Risk (06/29/2023)    Readmission Risk Interventions     No data to display

## 2023-07-06 NOTE — Consult Note (Signed)
 WOC Nurse Consult Note: Reason for Consult: draining wound, R Knee Wound type: surgical  Pressure Injury POA: NA Measurement: 6 cm suture line Wound bed: approximated, edematous suture line Drainage purulent, minimal  Periwound: edematous and red, tender to touch Dressing procedure/placement/frequency: Cleanse wound to R knee with NS and pat dry, apply strip of Aquacel Timm Foot # [phone]), cover with dry gauze and wrap with Kerlix, secure with tape, change M/W/F.     Gillermo Lack RN, MSN, [facility] WOC Team

## 2023-07-06 NOTE — Progress Notes (Signed)
 Physical Therapy Treatment Patient Details Name: Jonathan Ibarra MRN: [id number] DOB: [birth year] Today's Date: 07/06/2023   History of Present Illness Pt is a 58 y.o. male admitted 4/27 for c/o of R knee swelling & drainage. Multiple knee surgeries since October d/t infections. MRI showed suspicion of septic arthritis & osteomyelitis, degenerative tearing of lateral meniscus. I&D of R knee on 06/29/2023. PMH: cirrhosis, afib, HTN, SVT, polysubstance abuse,  Pt s/p 04/20/23 removal of hardware, I & D of R knee    PT Comments  Pt in bed upon arrival and agreeable to PT session. Limited PT session due to slight oozing from R knee incision with no dressing applied, RN notified. Pt reported 10/10 pain in R knee at beginning of session. He was able to tolerate gentle R knee AROM exercises with emphasis on quad activation and full knee extension. Pt is very determined and wants to keep progressing as able. Pt is progressing towards goals. Continue to recommend <3hrs post acute rehab to work towards independence with mobility. Acute PT to follow.      If plan is discharge home, [name];[name];Assist for transportation;Help with stairs or ramp for entrance   Can travel by private vehicle     Yes  Equipment Recommendations  Rolling walker (2 wheels)       Precautions / Restrictions Precautions Precautions: Fall Recall of Precautions/Restrictions: Intact Restrictions Weight Bearing Restrictions Per Provider Order: Yes RLE Weight Bearing Per Provider Order: Weight bearing as tolerated     Mobility  Bed Mobility    General bed mobility comments: deferred OOB mobility 2/2 oozing from incisions without dressing in place          Balance Overall balance assessment: Needs assistance Sitting-balance support: No upper extremity supported, Feet supported Sitting balance-Leahy Scale: Good      Standing balance support: Bilateral upper extremity supported, Reliant on assistive device for balance Standing balance-Leahy Scale: Poor Standing balance comment: reliant on UE support       Communication Communication Communication: No apparent difficulties  Cognition Arousal: Alert Behavior During Therapy: WFL for tasks assessed/performed   PT - Cognitive impairments: No apparent impairments    Following commands: Intact      Cueing Cueing Techniques: Verbal cues  Exercises General Exercises - Lower Extremity Ankle Circles/Pumps: AROM, Both, Supine, 20 reps Quad Sets: Right, 10 reps, Strengthening, Supine Heel Slides: AROM, Right, 10 reps, Supine Hip ABduction/ADduction: AAROM, Right, 5 reps, Supine    General Comments General comments (skin integrity, edema, etc.): Pt with discolored bandage on R knee, OT advised pt against removing bandage educated on risk of infection, pt ref and removed all bandages, RN notifed      Pertinent Vitals/Pain Pain Assessment Pain Assessment: Faces Faces Pain Scale: Hurts worst Pain Location: R knee Pain Descriptors / Indicators: Grimacing Pain Intervention(s): Limited activity within patient's tolerance, Monitored during session, Repositioned     PT Goals (current goals can now be found in the care plan section) Acute Rehab PT Goals PT Goal Formulation: With patient Time For Goal Achievement: 07/13/23 Potential to Achieve Goals: Good Progress towards PT goals: Progressing toward goals    Frequency    Min 2X/week       AM-PAC PT "6 Clicks" Mobility   Outcome Measure  Help needed turning from your back to your side while in a flat bed without using bedrails?: None Help needed moving from lying on your back to  sitting on the side of a flat bed without using bedrails?: A Little Help needed moving to and from a bed to a chair (including a wheelchair)?: A Little Help needed standing up from a chair using your arms (e.g.,  wheelchair or bedside chair)?: A Little Help needed to walk in hospital room?: A Little Help needed climbing 3-5 steps with a railing? : A Lot 6 Click Score: 18    End of Session   Activity Tolerance: Patient limited by pain Patient left: in bed;with call bell/phone within reach;with bed alarm set Nurse Communication: Mobility status (oozing from incision) PT Visit Diagnosis: Unsteadiness on feet (R26.81);Pain Pain - Right/Left: Right Pain - part of body: Knee     Time: 1610-9604 PT Time Calculation (min) (ACUTE ONLY): 20 min  Charges:    $Therapeutic Exercise: 8-22 mins PT General Charges $$ ACUTE PT VISIT: 1 Visit                     Orysia Blas, PT, DPT Secure Chat Preferred  Rehab Office [phone]    Alissa April Adela Ades 07/06/2023, 4:22 PM

## 2023-07-06 NOTE — Progress Notes (Signed)
 Occupational Therapy Treatment Patient Details Name: Jonathan Ibarra MRN: [id number] DOB: Feb 17, 1966 Today's Date: 07/06/2023   History of present illness Pt is a 58 y.o. male admitted 4/27 for c/o of R knee swelling & drainage. Multiple knee surgeries since October d/t infections. MRI showed suspicion of septic arthritis & osteomyelitis, degenerative tearing of lateral meniscus. I&D of R knee on 06/29/2023. PMH: cirrhosis, afib, HTN, SVT, polysubstance abuse,  Pt s/p 04/20/23 removal of hardware, I & D of R knee   OT comments  Pt progressing well towards goals. Pt agreeable to participate in OT session today. ADLs completed with CGA to S for safety. Pt with poor safety awareness with mobility and incision. OT educated pt on need for safety with mobility and surgical site, but pt impulsive and agitated regarding circumstance.  Continue to recommend <3 hours of skilled rehab daily to optimize independence levels. Will continue to follow acutely.       If plan is discharge home, recommend the following:  A little help with walking and/or transfers;[name];Assistance with cooking/housework   Equipment Recommendations  Other (comment)    Recommendations for Other Services      Precautions / Restrictions Precautions Precautions: Fall Recall of Precautions/Restrictions: Intact Restrictions Weight Bearing Restrictions Per Provider Order: Yes RLE Weight Bearing Per Provider Order: Weight bearing as tolerated       Mobility Bed Mobility Overal bed mobility: Needs Assistance Bed Mobility: Supine to Sit, Sit to Supine     Supine to sit: Supervision, [name] Sit to supine: Supervision, [name]   General bed mobility comments: S for safety    Transfers Overall transfer level: Needs assistance Equipment used: Rolling walker (2 wheels) Transfers: Sit to/from Stand, Bed to chair/wheelchair/BSC Sit to Stand: Supervision     Step pivot  transfers: Supervision     General transfer comment: Use of RW, pt with poor safety awareness, despite cues     Balance Overall balance assessment: Needs assistance Sitting-balance support: No upper extremity supported, [name], Reliant on assistive device for balance Standing balance-Leahy Scale: Poor Standing balance comment: reliant on UE support     ADL either performed or assessed with clinical judgement   ADL Overall ADL's : Needs assistance/impaired       Upper Body Dressing : Set up;Sitting   Lower Body Dressing: Set up;Bed level Lower Body Dressing Details (indicate cue type and reason): Pt able to don pants in bed Toilet Transfer: Ambulation;Supervision/safety;Regular Toilet   Toileting- Clothing Manipulation and Hygiene: Contact guard assist;Sit to/from stand;Cueing for safety Toileting - Clothing Manipulation Details (indicate cue type and reason): With use of GB pt able to stand and void, and manage clothing     Functional mobility during ADLs: Supervision/safety;Rolling walker (2 wheels)      Extremity/Trunk Assessment Upper Extremity Assessment Upper Extremity Assessment: Overall WFL for tasks assessed   Lower Extremity Assessment Lower Extremity Assessment: Defer to PT evaluation        Vision   Vision Assessment?: No apparent visual deficits         Communication Communication Communication: No apparent difficulties   Cognition Arousal: Alert Behavior During Therapy: Agitated Cognition: No family/caregiver present to determine baseline         OT - Cognition Comments: Decreased safety awareness     Following commands: Intact        Cueing   Cueing Techniques:  Verbal cues        General Comments Pt with discolored bandage on R knee, OT advised pt against removing bandage educated on risk of infection, pt ref and removed all  bandages, RN notifed    Pertinent Vitals/ Pain       Pain Assessment Pain Assessment: Faces Faces Pain Scale: Hurts whole lot Pain Location: R knee Pain Descriptors / Indicators: Grimacing Pain Intervention(s): Monitored during session   Frequency  Min 2X/week        Progress Toward Goals  OT Goals(current goals can now be found in the care plan section)  Progress towards OT goals: Progressing toward goals  Acute Rehab OT Goals Patient Stated Goal: To get his bandage changed OT Goal Formulation: With patient Time For Goal Achievement: 07/12/23 Potential to Achieve Goals: Fair ADL Goals Pt Will Perform Lower Body Dressing: with supervision;sit to/from stand Pt Will Transfer to Toilet: with supervision;ambulating;regular height toilet Pt Will Perform Toileting - Clothing Manipulation and hygiene: with supervision;sit to/from stand Pt Will Perform Tub/Shower Transfer: Tub transfer;Shower transfer;with supervision;ambulating;rolling walker;shower seat  Plan         AM-PAC OT "6 Clicks" Daily Activity     Outcome Measure   Help from another person eating meals?: None Help from another person taking care of personal grooming?: A Little Help from another person toileting, which includes using toliet, bedpan, or urinal?: A Little Help from another person bathing (including washing, rinsing, drying)?: A Little Help from another person to put on and taking off regular upper body clothing?: A Little Help from another person to put on and taking off regular lower body clothing?: A Little 6 Click Score: 19    End of Session Equipment Utilized During Treatment: Rolling walker (2 wheels)  OT Visit Diagnosis: Unsteadiness on feet (R26.81);Other abnormalities of gait and mobility (R26.89);Repeated falls (R29.6);Muscle weakness (generalized) (M62.81)   Activity Tolerance Patient tolerated treatment well   Patient Left in bed;with call bell/phone within reach;with bed alarm set    Nurse Communication Mobility status;Patient requests pain meds        Time: 4098-1191 OT Time Calculation (min): 19 min  Charges: OT General Charges $OT Visit: 1 Visit OT Treatments $Self Care/Home Management : 8-22 mins  Delmer Ferraris, OT  Acute Rehabilitation Services Office [phone] Secure chat preferred   Mickael Alamo 07/06/2023, 1:29 PM

## 2023-07-06 NOTE — Progress Notes (Addendum)
 PROGRESS NOTE    Jonathan Ibarra  NGE:[id number] DOB: [birth year] DOA: 06/26/2023 PCP: Sharry Deem, MD    Brief Narrative:   Jonathan Ibarra is a 58 y.o. male with past medical history significant for tibial plateau fracture, right knee septic arthritis with hardware removal, I&D, wound VAC placement February 2025, HTN, SVT, paroxysmal atrial fibrillation not on anticoagulation, cirrhosis s/p TIPS, chronic thrombocytopenia, anemia of chronic medical disease, polysubstance abuse who presented to [facility] ED on 06/26/2023 with right knee pain, swelling, drainage.  Reports in his usual state of health until this past week when he noticed increased pain, swelling, erythema and puslike drainage from his right knee.  Admitting to "not taking care of his knee".  Did not report to the infusion clinic for antibiotic administration March 2025 as planned and has not followed up with ID.  In the ED, temperature 97.8 F, HR 153, RR 26, BP 126/91, SpO2 100% on room air.  WBC 6.9, hemoglobin 13.0, platelet count 298.  Sodium 138, potassium 3.1, chloride 104, CO2 24, glucose 56, BUN 9, creatinine 0.71.  AST 24, ALT 12, total bilirubin 1.2.  INR 1.2.  UDS positive for amphetamines, opiates, THC.  Chest x-ray with cardiomegaly with mild interstitial edema and small right pleural effusion.  Right knee x-ray with sequelae of medial/lateral tibial plateau fracture status post hardware removal, probable joint effusion with some evidence of bony reabsorption in the lateral tibial plateau related to prior trauma versus osteomyelitis.  MR right knee without contrast with enlarging complex knee joint effusion with diffuse periarticular marrow edema suspicious for septic arthritis and osteomyelitis, probable associated intraosseous abscess proximal tibia with fistulous tract communicating with a lateral soft tissue component, extensive periarticular soft tissue edema surrounding the knee.  Blood cultures x 2 obtained.  Patient  was started on a Cardizem  drip for A-fib with RVR.  Orthopedics consulted.  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  Noncompliant with outpatient therapy including IV antibiotics and follow-up with infectious disease.  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  Patient is afebrile without leukocytosis.  MR right knee with findings consistent with acute septic arthritis with concern for osteomyelitis and abscess formation.  Seen by orthopedics, Dr. Curtiss Dowdy who initially recommended AKA, but patient declined.  Patient underwent excision of intraosseous abscess right proximal tibia with I&D right leg abscess and placement of antibiotic beads intraosseous on 06/29/2023.  Superficial leg culture and operative culture positive for MRSA.  Blood cultures x 2 negative for 5 days.  Patient was initially placed on IV antibiotics with vancomycin  with plan 4-week course with plan to transition to oritavancin  to complete antibiotic course followed by oral suppression outpatient; however patient continued to pull out multiple IV lines during hospitalization and ID transition patient to linezolid  600 mg p.o. twice daily. -- Continue linezolid  600 mg p.o. twice daily -- Postoperative wound care per orthopedics, WBAT; no shower -- Outpatient follow-up with orthopedics ([facility]) and infectious disease in 2 weeks -- Oxycodone  15-20 mg p.o. every 4 hours as needed moderate/severe pain -- Dilaudid  1 mg IV every 4 hours as needed severe pain not relieved with oral medication -- Contact precautions -- Discharge to SNF  Paroxysmal atrial fibrillation  with RVR Essential  hypertension Patient presenting with elevated heart rate of 153.  Initially placed on Cardizem  drip.  Home medications restarted and was able to be titrated off.  Not on anticoagulation outpatient. -- Diltiazem  180 mg p.o. daily -- Metoprolol  succinate 50 mg p.o. daily  Cirrhosis s/p TIPS -- Low-salt diet -- Outpatient follow-up with GI  Anemia of chronic medical disease -- Hgb 11.1, stable.  Hyperkalemia Potassium 5.4 and given 1 dose of Lokelma , now resolved with potassium 4.5.  Polysubstance use UDS positive for amphetamines, opiates, THC.  Counseled on need for complete abstinence/cessation from illicit substances.  Obesity, class I BMI 33.64 kg/m   DVT prophylaxis: SCDs Start: 06/29/23 1203    Code Status: Full Code Family Communication: No family present at bedside this morning  Disposition Plan:  Level of care: Med-Surg Status is: Inpatient Remains inpatient appropriate because: Pending SNF placement    Consultants:  Orthopedics, Dr. Curtiss Dowdy Infectious disease  Procedures:  None  Antimicrobials:  Vancomycin  4/27 - 5/4 Ceftriaxone  4/27 - 4/27 Linezolid  5/6>>   Subjective: Patient seen examined bedside, lying in bed.  Continues to complain of knee pain, drainage from surgical site.  Multiple episodes of removing IV catheter, seen by ID yesterday with change to oral linezolid .  Awaiting SNF placement.  Requesting pain medication this morning.  No other questions, concerns or complaints at this time.  Denies headache, no dizziness, no chest pain, no palpitations, no shortness of breath, no abdominal pain, no fever/chills/night sweats, no nausea/vomiting/diarrhea, no focal weakness, no fatigue, no paresthesias.  No acute events overnight per nursing staff.  Objective: Vitals:   07/05/23 1622 07/05/23 2041 07/06/23 0434 07/06/23 0746  BP: 136/86 134/75 129/83 (!) 151/92  Pulse: (!) 57 67 68 77  Resp: 17 17 17 16   Temp:  97.8 F (36.6 C) 98.2 F (36.8 C) 98.2  F (36.8 C)  TempSrc:  Oral Oral Oral  SpO2: 100% 99% 100% 99%  Weight:      Height:        Intake/Output Summary (Last 24 hours) at 07/06/2023 1410 Last data filed at 07/06/2023 0300 Gross per 24 hour  Intake 200 ml  Output 600 ml  Net -400 ml   Filed Weights   06/26/23 2340  Weight: 115.7 kg    Examination:  Physical Exam: GEN: NAD, alert and oriented x 3, chronically ill-appearing, appears older than stated age HEENT: NCAT, PERRL, EOMI, sclera clear, MMM PULM: CTAB w/o wheezes/crackles, normal respiratory effort, on room air CV: RRR w/o M/G/R GI: abd soft, NTND, + BS MSK: Right knee with Ace wrap in place, drainage noted soaking through bandage, moves all extremities independently with preserved muscle strength NEURO: CN II-XII intact, no focal deficits, sensation to light touch intact PSYCH: normal mood/affect Integumentary: Right knee Ace wrap in place with drainage from surgical site, no other concerning rashes/lesions/wounds noted on exposed skin surfaces    Data Reviewed: I have personally reviewed following labs and imaging studies  CBC: Recent Labs  Lab 07/01/23 1241 07/02/23 0406 07/03/23 0201 07/04/23 1053 07/05/23 0645  WBC 10.5 8.7 8.2 8.7 6.9  NEUTROABS 8.4* 6.3 5.4 6.3 5.3  HGB 12.4* 11.5* 11.1* 12.9* 11.1*  HCT 38.0* 35.5* 34.2* 39.6 33.7*  MCV 85.8 86.6 87.9 88.0 85.3  PLT 371 331 274 328 211   Basic Metabolic Panel: Recent Labs  Lab 07/01/23 1241 07/02/23 0406 07/03/23 0201 07/04/23 1053 07/05/23 0645  NA 132* 132* 134* 132* 134*  K 5.2* 4.1 4.1  5.4* 4.5  CL 101 101 102 100 100  CO2 25 25 27 28 28   GLUCOSE 134* 132* 129* 153* 107*  BUN 20 20 17 15 13   CREATININE 0.72 0.69 0.70 0.73 0.62  CALCIUM  9.4 8.7* 8.4* 9.0 8.2*  MG 1.9 1.7 1.8 1.8 1.8  PHOS 3.3 3.1 3.1 3.1 3.0   GFR: Estimated Creatinine Clearance: 137.4 mL/min (by C-G formula based on SCr of 0.62 mg/dL). Liver Function Tests: Recent Labs  Lab 07/01/23 1241  07/02/23 0406 07/03/23 0201 07/04/23 1053 07/05/23 0645  AST 24 23 22 26 19   ALT 13 12 14 17 14   ALKPHOS 71 68 77 92 71  BILITOT 1.3* 1.2 0.9 1.3* 1.2  PROT 7.1 6.3* 5.8* 6.9 5.7*  ALBUMIN 2.2* 2.0* 1.8* 2.3* 1.8*   No results for input(s): "LIPASE", "AMYLASE" in the last 168 hours. Recent Labs  Lab 06/30/23 0707  AMMONIA 55*   Coagulation Profile: Recent Labs  Lab 06/30/23 0707  INR 1.3*   Cardiac Enzymes: No results for input(s): "CKTOTAL", "CKMB", "CKMBINDEX", "TROPONINI" in the last 168 hours. BNP (last 3 results) No results for input(s): "PROBNP" in the last 8760 hours. HbA1C: No results for input(s): "HGBA1C" in the last 72 hours.  CBG: No results for input(s): "GLUCAP" in the last 168 hours.  Lipid Profile: No results for input(s): "CHOL", "HDL", "LDLCALC", "TRIG", "CHOLHDL", "LDLDIRECT" in the last 72 hours. Thyroid Function Tests: No results for input(s): "TSH", "T4TOTAL", "FREET4", "T3FREE", "THYROIDAB" in the last 72 hours. Anemia Panel: Recent Labs    07/04/23 1053  VITAMINB12 578  FOLATE 8.6  FERRITIN 83  TIBC 224*  IRON 50  RETICCTPCT 2.2   Sepsis Labs: No results for input(s): "PROCALCITON", "LATICACIDVEN" in the last 168 hours.   Recent Results (from the past 240 hours)  Blood Culture (routine x 2)     Status: None   Collection Time: 06/27/23  2:16 AM   Specimen: BLOOD  Result Value Ref Range Status   Specimen Description BLOOD SITE NOT SPECIFIED  Final   Special Requests   Final    BOTTLES DRAWN AEROBIC AND ANAEROBIC Blood Culture adequate volume   Culture   Final    NO GROWTH 5 DAYS Performed at [facility], [address] [place], [place] [address]    Report Status 07/02/2023 FINAL  Final  Blood Culture (routine x 2)     Status: None   Collection Time: 06/27/23  2:25 AM   Specimen: BLOOD  Result Value Ref Range Status   Specimen Description BLOOD SITE NOT SPECIFIED  Final   Special Requests   Final    BOTTLES DRAWN  AEROBIC AND ANAEROBIC Blood Culture adequate volume   Culture   Final    NO GROWTH 5 DAYS Performed at [facility], [address] [place], [place] [address]    Report Status 07/02/2023 FINAL  Final  Aerobic Culture w Gram Stain (superficial specimen)     Status: None   Collection Time: 06/27/23  3:27 AM   Specimen: Leg  Result Value Ref Range Status   Specimen Description LEG RIGHT  Final   Special Requests NONE  Final   Gram Stain NO WBC SEEN RARE GRAM POSITIVE COCCI IN SINGLES   Final   Culture   Final    MODERATE METHICILLIN RESISTANT STAPHYLOCOCCUS AUREUS FEW STREPTOCOCCUS GROUP G Beta hemolytic streptococci are predictably susceptible to penicillin  and other beta lactams. Susceptibility testing not routinely performed. Performed at [facility], [address], [place], [place] [address]    Report Status 06/29/2023 FINAL  Final   Organism ID, Bacteria METHICILLIN RESISTANT STAPHYLOCOCCUS AUREUS  Final      Susceptibility   Methicillin resistant staphylococcus aureus - MIC*    CIPROFLOXACIN >=8 RESISTANT Resistant     ERYTHROMYCIN >=8 RESISTANT Resistant     GENTAMICIN  <=0.5 SENSITIVE Sensitive     OXACILLIN >=4 RESISTANT Resistant     TETRACYCLINE <=1 SENSITIVE Sensitive     VANCOMYCIN  1 SENSITIVE Sensitive     TRIMETH/SULFA >=320 RESISTANT Resistant     CLINDAMYCIN  <=0.25 SENSITIVE Sensitive     RIFAMPIN <=0.5 SENSITIVE Sensitive     Inducible Clindamycin  NEGATIVE Sensitive     LINEZOLID  2 SENSITIVE Sensitive     * MODERATE METHICILLIN RESISTANT STAPHYLOCOCCUS AUREUS  Surgical PCR screen     Status: Abnormal   Collection Time: 06/27/23  4:03 PM   Specimen: Nasal Mucosa; Nasal Swab  Result Value Ref Range Status   MRSA, PCR POSITIVE (A) NEGATIVE Final    Comment: RESULT CALLED TO, READ BACK BY AND VERIFIED WITH: RN CLINT WOOTEN ON 06/27/23 @ 1818 BY DRT    Staphylococcus aureus POSITIVE (A) NEGATIVE Final    Comment: (NOTE) The Xpert SA Assay (FDA  approved for NASAL specimens in patients 48 years of age and older), is one component of a comprehensive surveillance program. It is not intended to diagnose infection nor to guide or monitor treatment. Performed at [facility], [address] [place], [place] [address]   Aerobic/Anaerobic Culture w Gram Stain (surgical/deep wound)     Status: None   Collection Time: 06/29/23 10:39 AM   Specimen: Leg, Right; Abscess  Result Value Ref Range Status   Specimen Description TISSUE  Final   Special Requests A SOFT TIS ABSCESS  Final   Gram Stain   Final    ABUNDANT WBC PRESENT, PREDOMINANTLY PMN NO ORGANISMS SEEN    Culture   Final    RARE METHICILLIN RESISTANT STAPHYLOCOCCUS AUREUS NO ANAEROBES ISOLATED Performed at [facility], [address] [place], [place] [address]    Report Status 07/04/2023 FINAL  Final   Organism ID, Bacteria METHICILLIN RESISTANT STAPHYLOCOCCUS AUREUS  Final      Susceptibility   Methicillin resistant staphylococcus aureus - MIC*    CIPROFLOXACIN >=8 RESISTANT Resistant     ERYTHROMYCIN >=8 RESISTANT Resistant     GENTAMICIN  <=0.5 SENSITIVE Sensitive     OXACILLIN >=4 RESISTANT Resistant     TETRACYCLINE >=16 RESISTANT Resistant     VANCOMYCIN  1 SENSITIVE Sensitive     TRIMETH/SULFA >=320 RESISTANT Resistant     CLINDAMYCIN  <=0.25 SENSITIVE Sensitive     RIFAMPIN <=0.5 SENSITIVE Sensitive     Inducible Clindamycin  NEGATIVE Sensitive     LINEZOLID  2 SENSITIVE Sensitive     * RARE METHICILLIN RESISTANT STAPHYLOCOCCUS AUREUS  Aerobic/Anaerobic Culture w Gram Stain (surgical/deep wound)     Status: None   Collection Time: 06/29/23 10:41 AM   Specimen: Leg, Right; Abscess  Result Value Ref Range Status   Specimen Description ABSCESS  Final   Special Requests B  Final   Gram Stain   Final    ABUNDANT WBC PRESENT, PREDOMINANTLY PMN NO ORGANISMS SEEN    Culture   Final    RARE METHICILLIN RESISTANT STAPHYLOCOCCUS AUREUS NO ANAEROBES  ISOLATED Performed at [facility], [address] [place], [place] [address]    Report  Status 07/04/2023 FINAL  Final   Organism ID, Bacteria METHICILLIN RESISTANT STAPHYLOCOCCUS AUREUS  Final      Susceptibility   Methicillin resistant staphylococcus aureus - MIC*    CIPROFLOXACIN >=8 RESISTANT Resistant     ERYTHROMYCIN >=8 RESISTANT Resistant     GENTAMICIN  <=0.5 SENSITIVE Sensitive     OXACILLIN >=4 RESISTANT Resistant     TETRACYCLINE >=16 RESISTANT Resistant     VANCOMYCIN  1 SENSITIVE Sensitive     TRIMETH/SULFA >=320 RESISTANT Resistant     CLINDAMYCIN  <=0.25 SENSITIVE Sensitive     RIFAMPIN <=0.5 SENSITIVE Sensitive     Inducible Clindamycin  NEGATIVE Sensitive     LINEZOLID  2 SENSITIVE Sensitive     * RARE METHICILLIN RESISTANT STAPHYLOCOCCUS AUREUS  Aerobic/Anaerobic Culture w Gram Stain (surgical/deep wound)     Status: None   Collection Time: 06/29/23 10:41 AM   Specimen: Leg, Right; Abscess  Result Value Ref Range Status   Specimen Description ABSCESS  Final   Special Requests C  Final   Gram Stain   Final    ABUNDANT WBC PRESENT, PREDOMINANTLY PMN NO ORGANISMS SEEN    Culture   Final    No growth aerobically or anaerobically. Performed at [facility], [address], [place], [place] [address]    Report Status 07/04/2023 FINAL  Final  MIC (1 Drug)-Abscess; 06/29/2023; Leg, Right; Staph aureus; Daptomycin      Status: None   Collection Time: 06/29/23 12:41 PM   Specimen: Leg, Right  Result Value Ref Range Status   Min Inhibitory Conc (1 Drug) Final report  Corrected    Comment: (NOTE) Performed At: [facility] [address] [place], [place] [id number] Pearlean Botts MD XB:[id number] CORRECTED ON 05/07 AT 0736: PREVIOUSLY REPORTED AS Preliminary report    Source ABSCESS  Final    Comment: Performed at [facility], [address], [place], [place] [address]  MIC Result     Status: None   Collection Time: 06/29/23 12:41 PM   Result Value Ref Range Status   Result 1 (MIC) Staphylococcus aureus  Final    Comment: (NOTE) Identification performed by account, not confirmed by this laboratory. DAPTOMYCIN     <=1 UG/ML = SUSCEPTIBLE Performed At: [facility] [address] [place], [place] [id number] Pearlean Botts MD [id number]          Radiology Studies: No results found.       Scheduled Meds:  aspirin   325 mg Oral Daily   diltiazem   180 mg Oral Daily   docusate sodium   100 mg Oral BID   linezolid   600 mg Oral Q12H   metoprolol  succinate  50 mg Oral Daily   sodium chloride  flush  10-40 mL Intracatheter Q12H   Continuous Infusions:     LOS: 9 days    Time spent: 52 minutes spent on 07/06/2023 caring for this patient face-to-face including chart review, ordering labs/tests, documenting, discussion with nursing staff, consultants, updating family and interview/physical exam    Rema Care [place], DO [facility] Available via Epic secure chat 7am-7pm After these hours, please refer to coverage provider listed on [email or web address] 07/06/2023, 2:10 PM

## 2023-07-07 DIAGNOSIS — T8450XD Infection and inflammatory reaction due to unspecified internal joint prosthesis, subsequent encounter: Secondary | ICD-10-CM | POA: Diagnosis not present

## 2023-07-07 MED ORDER — OXYCODONE HCL 15 MG PO TABS: 15.0000 mg | ORAL_TABLET | ORAL | 0 refills | Status: DC | PRN

## 2023-07-07 MED ORDER — METHOCARBAMOL 750 MG PO TABS: 750.0000 mg | ORAL_TABLET | Freq: Three times a day (TID) | ORAL | 0 refills | Status: DC | PRN

## 2023-07-07 MED ORDER — DOCUSATE SODIUM 100 MG PO CAPS: 100.0000 mg | ORAL_CAPSULE | Freq: Two times a day (BID) | ORAL | Status: AC

## 2023-07-07 MED ORDER — LINEZOLID 600 MG PO TABS: 600.0000 mg | ORAL_TABLET | Freq: Two times a day (BID) | ORAL | 0 refills | Status: AC

## 2023-07-07 MED ORDER — ASPIRIN 325 MG PO TABS: 325.0000 mg | ORAL_TABLET | Freq: Every day | ORAL | 0 refills | Status: AC

## 2023-07-07 NOTE — TOC Transition Note (Signed)
 Transition of Care [facility]) - Discharge Note   Patient Details  Name: Jonathan Ibarra MRN: [id number] Date of Birth: 1965-04-03  [facility]) CM/SW Contact:  Arron Big, LCSWA Phone Number: 07/07/2023, 1:44 PM   Clinical Narrative:   Patient will DC to: [facility] Anticipated DC date: 07/07/23  Family notified: No one listed in chart w/ contact info Transport by: Lyna Sandhoff   Per MD patient ready for DC to [facility]. RN to call report prior to discharge [phone]). RN, patient, patient's family, and facility notified of DC. Discharge Summary and FL2 sent to facility. DC packet on chart. Ambulance transport requested for patient at 2:33PM.   CSW will sign off for now as social work intervention is no longer needed. Please consult us  again if new needs arise.      Final next level of care: Skilled Nursing Facility Barriers to Discharge: Barriers Resolved   Patient Goals and CMS Choice Patient states their goals for this hospitalization and ongoing recovery are:: patient wants to go to [facility]          Discharge Placement              Patient chooses bed at: Other - please specify in the comment section below: ([facility]) Patient to be transferred to facility by: PTAR Name of family member notified: No contact info listed in chart Patient and family notified of of transfer: 07/07/23  Discharge Plan and Services Additional resources added to the After Visit Summary for     Discharge Planning Services: CM Consult Post Acute Care Choice:  (await PT eval post op)          DME Arranged:  (await post op eval)         HH Arranged:  (see note)          Social Drivers of Health (SDOH) Interventions SDOH Screenings   Food Insecurity: Food Insecurity Present (06/27/2023)  Housing: High Risk (06/27/2023)  Transportation Needs: Unmet Transportation Needs (06/27/2023)  Utilities: Not At Risk  (06/27/2023)  Financial Resource Strain: Medium Risk (01/07/2020)   Received from [facility], [facility]  Social Connections: Unknown (07/05/2021)   Received from [facility], [facility]  Stress: No Stress Concern Present (01/07/2020)   Received from [facility], [facility]  Tobacco Use: High Risk (06/29/2023)     Readmission Risk Interventions     No data to display

## 2023-07-07 NOTE — Progress Notes (Signed)
 Report called and given to RN at 1506 to [facility].

## 2023-07-07 NOTE — Plan of Care (Signed)
  Problem: Pain Managment: Goal: General experience of comfort will improve and/or be controlled Outcome: Progressing   Problem: Safety: Goal: Ability to remain free from injury will improve Outcome: Progressing   Problem: Skin Integrity: Goal: Risk for impaired skin integrity will decrease Outcome: Progressing

## 2023-07-07 NOTE — Plan of Care (Signed)
   Problem: Activity: Goal: Risk for activity intolerance will decrease Outcome: Progressing   Problem: Nutrition: Goal: Adequate nutrition will be maintained Outcome: Progressing   Problem: Pain Managment: Goal: General experience of comfort will improve and/or be controlled Outcome: Progressing   Problem: Safety: Goal: Ability to remain free from injury will improve Outcome: Progressing

## 2023-07-07 NOTE — TOC Progression Note (Signed)
 Transition of Care [facility]) - Progression Note    Patient Details  Name: Kore Sanluis MRN: [id number] Date of Birth: April 05, 1965  [facility]) CM/SW Contact  Arron Big, [place] Phone Number: 07/07/2023, 1:45 PM  Clinical Narrative:   Per facility, patients insurance [employer] has been approved and patient can DC to facility today. CSW notfied MD.   TOC will continue to follow.    Expected Discharge Plan: Skilled Nursing Facility Barriers to Discharge: Barriers Resolved  Expected Discharge Plan and Services   Discharge Planning Services: CM Consult Post Acute Care Choice:  (await PT eval post op) Living arrangements for the past 2 months: Single Family Home Expected Discharge Date: 07/07/23               DME Arranged:  (await post op eval)         HH Arranged:  (see note)           Social Determinants of Health (SDOH) Interventions SDOH Screenings   Food Insecurity: Food Insecurity Present (06/27/2023)  Housing: High Risk (06/27/2023)  Transportation Needs: Unmet Transportation Needs (06/27/2023)  Utilities: Not At Risk (06/27/2023)  Financial Resource Strain: Medium Risk (01/07/2020)   Received from [facility], [facility]  Social Connections: Unknown (07/05/2021)   Received from [facility], [facility]  Stress: No Stress Concern Present (01/07/2020)   Received from [facility], [facility]  Tobacco Use: High Risk (06/29/2023)    Readmission Risk Interventions     No data to display

## 2023-07-07 NOTE — Progress Notes (Signed)
    [facility]  Date of Admission:  06/26/2023     Reason for Follow Up: Prosthetic joint infection, subsequent encounter          BRIEF PROGRESS NOTE:  Afebrile with no acute events.  Tolerating antibiotics with no adverse side effects.  Most recent platelet count of 211.  Will need prolonged course of antibiotics around 6 weeks and will continue with current dose of linezolid  600 mg PO bid and close follow up in ID clinic with current disposition likely to SNF.  Ok for discharge from ID standpoint and will follow up in clinic on 07/21/23 at 9:15 am.   Marlan Silva, NP [facility]  07/07/2023  12:09 PM

## 2023-07-07 NOTE — Discharge Summary (Signed)
 Physician Discharge Summary  Jonathan Ibarra ZOX:[id number] DOB: 09-12-1965 DOA: 06/26/2023  PCP: Sharry Deem, MD  Admit date: 06/26/2023 Discharge date: 07/07/2023  Admitted From: Home Disposition: SNF  Recommendations for Outpatient Follow-up:  Follow up with PCP in 1-2 weeks Follow-up with infectious disease as scheduled on 07/21/2023 Outpatient with orthopedics, Dr. Curtiss Dowdy 2 weeks Outpatient follow-up with GI as needed for cirrhosis Continue antibiotics with linezolid  600 mg p.o. twice daily to complete 6-week course from date of operative treatment (06/29/2023)  Discharge Condition: Stable CODE STATUS: Full code Diet recommendation: Heart healthy/low-salt diet  History of present illness:  Jonathan Ibarra is a 58 y.o. male with past medical history significant for tibial plateau fracture, right knee septic arthritis with hardware removal, I&D, wound VAC placement February 2025, HTN, SVT, paroxysmal atrial fibrillation not on anticoagulation, cirrhosis s/p TIPS, chronic thrombocytopenia, anemia of chronic medical disease, polysubstance abuse who presented to [facility] ED on 06/26/2023 with right knee pain, swelling, drainage.  Reports in his usual state of health until this past week when he noticed increased pain, swelling, erythema and puslike drainage from his right knee.  Admitting to "not taking care of his knee".  Did not report to the infusion clinic for antibiotic administration March 2025 as planned and has not followed up with ID.   In the ED, temperature 97.8 F, HR 153, RR 26, BP 126/91, SpO2 100% on room air.  WBC 6.9, hemoglobin 13.0, platelet count 298.  Sodium 138, potassium 3.1, chloride 104, CO2 24, glucose 56, BUN 9, creatinine 0.71.  AST 24, ALT 12, total bilirubin 1.2.  INR 1.2.  UDS positive for amphetamines, opiates, THC.  Chest x-ray with cardiomegaly with mild interstitial edema and small right pleural effusion.  Right knee x-ray with sequelae of medial/lateral  tibial plateau fracture status post hardware removal, probable joint effusion with some evidence of bony reabsorption in the lateral tibial plateau related to prior trauma versus osteomyelitis.  MR right knee without contrast with enlarging complex knee joint effusion with diffuse periarticular marrow edema suspicious for septic arthritis and osteomyelitis, probable associated intraosseous abscess proximal tibia with fistulous tract communicating with a lateral soft tissue component, extensive periarticular soft tissue edema surrounding the knee.  Blood cultures x 2 obtained.  Patient was started on a Cardizem  drip for A-fib with RVR.  Orthopedics consulted.  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  Noncompliant with outpatient therapy including IV antibiotics and follow-up with infectious disease.  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  Patient is afebrile without leukocytosis.  MR right knee with findings consistent with acute septic arthritis with concern for osteomyelitis and abscess formation.  Seen by orthopedics, Dr. Curtiss Dowdy who initially recommended AKA, but patient declined.  Patient underwent excision of intraosseous abscess right proximal tibia with I&D right leg abscess and placement of antibiotic beads intraosseous on 06/29/2023.  Superficial leg culture and operative culture positive for MRSA.  Blood cultures x 2 negative for 5 days.  Patient was initially placed on IV antibiotics with vancomycin  with plan 4-week course with plan to transition to oritavancin  to complete antibiotic course followed by oral suppression outpatient; however  patient continued to pull out multiple IV lines during hospitalization and  ID transition patient to linezolid  600 mg p.o. twice daily.  Will continue for 6 weeks postoperatively for treatment per infectious disease.  Weightbearing as tolerates, dressing changes 3 times weekly, no showering.  Oxycodone  as needed pain control.  Outpatient follow-up with orthopedics (Haddix) and infectious disease in 2 weeks.  Discharging to SNF.   Paroxysmal atrial fibrillation with RVR Essential hypertension Patient presenting with elevated heart rate of 153.  Initially placed on Cardizem  drip.  Home medications restarted and was able to be titrated off.  Not on anticoagulation outpatient. Diltiazem  180 mg p.o. daily, Metoprolol  succinate 50 mg p.o. daily   Cirrhosis s/p TIPS Low-salt diet, Outpatient follow-up with GI   Anemia of chronic medical disease Hgb 11.1, stable.   Hyperkalemia: Resolved Potassium 5.4 and given 1 dose of Lokelma , now resolved with potassium 4.5.   Polysubstance use UDS positive for amphetamines, opiates, THC.  Counseled on need for complete abstinence/cessation from illicit substances.   Obesity, class I BMI 33.64 kg/m  Discharge Diagnoses:  Principal Problem:   Prosthetic joint infection, subsequent encounter Active Problems:   Osteomyelitis [facility])    Discharge Instructions  Discharge Instructions     Call MD for:  difficulty breathing, headache or visual disturbances   Complete by: As directed    Call MD for:  extreme fatigue   Complete by: As directed    Call MD for:  persistant dizziness or light-headedness   Complete by: As directed    Call MD for:  persistant nausea and vomiting   Complete by: As directed    Call MD for:  severe uncontrolled pain   Complete by: As directed    Call MD for:  temperature >100.4   Complete by: As directed    Diet - low sodium heart healthy   Complete by: As directed    Discharge wound care:   Complete by: As directed     Cleanse wound to R knee with NS and pat dry, apply strip of Aquacel, cover with dry gauze and wrap with Kerlix, secure with tape, change M/W/F.   Increase activity slowly   Complete by: As directed       Allergies as of 07/07/2023       Reactions   Tylenol  [acetaminophen ] Other (See Comments)   Impacts liver        Medication List     TAKE these medications    aspirin  325 MG tablet Take 1 tablet (325 mg total) by mouth daily.   chlorhexidine  4 % external liquid Commonly known as: HIBICLENS  Apply 15 mLs (1 Application total) topically as directed for 30 doses. Use as directed daily for 5 days every other week for 6 weeks.   diltiazem  180 MG 24 hr capsule Commonly known as: CARDIZEM  CD Take 1 capsule (180 mg total) by mouth daily.   docusate sodium  100 MG capsule Commonly known as: COLACE Take 1 capsule (100 mg total) by mouth 2 (two) times daily.   linezolid  600 MG tablet Commonly known as: ZYVOX  Take 1 tablet (600 mg total) by mouth every 12 (twelve) hours.   methocarbamol  750 MG tablet Commonly known as: ROBAXIN  Take 1 tablet (750 mg total) by mouth every 8 (eight) hours as needed for muscle spasms.   metoprolol  succinate 50 MG 24 hr tablet Commonly known as: TOPROL -XL Take 1 tablet (50 mg total) by mouth daily. Take with or immediately following a meal.   mupirocin  ointment 2 % Commonly known as: BACTROBAN  Place 1 Application into  the nose 2 (two) times daily for 60 doses. Use as directed 2 times daily for 5 days every other week for 6 weeks.   oxyCODONE  15 MG immediate release tablet Commonly known as: ROXICODONE  Take 1 tablet (15 mg total) by mouth every 4 (four) hours as needed. What changed: Another medication with the same name was added. Make sure you understand how and when to take each.   oxyCODONE  15 MG immediate release tablet Commonly known as: ROXICODONE  Take 1 tablet (15 mg total) by mouth every 4 (four) hours as needed for moderate pain (pain  score 4-6) or severe pain (pain score 7-10). What changed: You were already taking a medication with the same name, and this prescription was added. Make sure you understand how and when to take each.               Discharge Care Instructions  (From admission, onward)           Start     Ordered   07/07/23 0000  Discharge wound care:       Comments: Cleanse wound to R knee with NS and pat dry, apply strip of Aquacel, cover with dry gauze and wrap with Kerlix, secure with tape, change M/W/F.   07/07/23 1324            Contact information for follow-up providers     Haddix, Florentina Huntsman, MD. Schedule an appointment as soon as possible for a visit in 2 week(s).   Specialty: Orthopedic Surgery Why: for wound check and suture removal Contact information: [address] [place] [address] [phone]         Sharry Deem, MD. Schedule an appointment as soon as possible for a visit in 1 week(s).   Specialty: Family Medicine Contact information: [address] [place] [address] [phone]         Bubba Carbo, FNP. Go on 07/21/2023.   Specialty: Infectious Diseases Why: 915am Contact information: [address] [place] [address] [phone]              Contact information for after-discharge care     Destination     [facility] .   Service: Skilled Nursing Contact information: [address] [place]  [address] [phone]                    Allergies  Allergen Reactions   Tylenol  [Acetaminophen ] Other (See Comments)    Impacts liver    Consultations: Orthopedics Infectious Disease   Procedures/Studies: US  EKG SITE RITE Result Date: 07/03/2023 If Site Rite image not attached, placement could not be confirmed due to current cardiac rhythm.  DG Knee 1-2 Views Right Result Date: 06/29/2023 CLINICAL DATA:  Irrigation debridement of  right knee EXAM: Intraoperative fluoroscopy COMPARISON:  Preop x-ray 06/27/2023 FINDINGS: Four fluoroscopic spot images demonstrate current ties of bone along the upper tibial metaphysis with placement of presumed antibiotic beads. Imaging was obtained to aid in treatment. Please correlate with real-time fluoroscopy 7.8 seconds. Cumulative dose 0.63 mGy IMPRESSION: Intraoperative fluoroscopy Electronically Signed   By: Adrianna Horde M.D.   On: 06/29/2023 11:24   DG C-Arm 1-60 Min-No Report Result Date: 06/29/2023 Fluoroscopy was utilized by the requesting physician.  No radiographic interpretation.   MR KNEE RIGHT WO CONTRAST Result Date: 06/27/2023 CLINICAL DATA:  Septic arthritis suspected. Previous tibial plateau fracture with ORIF and hardware removal. Increasing pain after  running out of medication. EXAM: MRI OF THE RIGHT KNEE WITHOUT CONTRAST TECHNIQUE: Multiplanar, multisequence MR imaging of the knee was performed. No intravenous contrast was administered. COMPARISON:  Radiographs 06/27/2023 and 04/20/2023.  CT 04/18/2023. FINDINGS: Technical note: Despite efforts by the technologist and patient, moderate motion artifact is present on today's exam and could not be eliminated. This reduces exam sensitivity and specificity. Patient was unable to complete the examination. No postcontrast imaging was performed. Bones/Joint/Cartilage Previously demonstrated proximal tibial plate and screws have been removed in the interval. Again demonstrated are extensive underlying fractures of both tibial plateaus without evidence of interval healing. There is progressive depression and irregularity of the lateral tibial plateau with an intraosseous fluid collection laterally in the proximal tibial metaphysis, measuring approximately 3.8 x 2.8 x 4.9 cm, suspicious for an intraosseous abscess. There is diffusely increased T1 and decreased T1 marrow signal within the proximal tibia and distal femur, suspicious for  osteomyelitis. No gross abnormality of the patella or proximal fibula is seen. There is a large complex knee joint effusion which has enlarged from the previous CT. Ligaments The cruciate and collateral ligaments appear grossly intact. Probable diffuse degenerative tearing of the body and anterior horn of the lateral meniscus. There is free edge blunting of the medial meniscus without displaced meniscal fragment. Muscles and Tendons The quadriceps and patellar tendons appear intact. The muscles surrounding the knee are diffusely edematous. Soft tissue As above, enlarging complex knee joint effusion. There is a focal fluid collection along the anterolateral aspect of the proximal tibia, which appears to communicate with the intraosseous fluid collection described above via a fistulous tract. The soft tissue component of this fluid collection measures approximately 5.7 x 3.5 x 3.3 cm. No other focal fluid collections are identified, although there is extensive periarticular soft tissue edema surrounding the knee. IMPRESSION: 1. Removal of proximal tibial plate and screws on 04/20/2023. 2. Enlarging complex knee joint effusion with diffuse periarticular marrow edema highly suspicious for septic arthritis and osteomyelitis. There is a probable associated intraosseous abscess laterally in the proximal tibia with a fistulous track communicating with a lateral soft tissue component. 3. Extensive periarticular soft tissue edema surrounding the knee. 4. Probable diffuse degenerative tearing of the body and anterior horn of the lateral meniscus. Free edge blunting of the medial meniscus without displaced meniscal fragment. Electronically Signed   By: Elmon Hagedorn M.D.   On: 06/27/2023 12:00   DG Knee Right Port Result Date: 06/27/2023 CLINICAL DATA:  Septic joint. EXAM: PORTABLE RIGHT KNEE - 1-2 VIEW COMPARISON:  04/20/2023 FINDINGS: Four views study limited by positioning and underpenetration. Sequelae of medial and  lateral tibial plateau fracture again noted, status post hardware retrieval. Loss of joint space noted medial compartment with some evidence of bony resorption in the region of the lateral tibial plateau. Probable joint effusion. Soft tissue edema evident. IMPRESSION: 1. Sequelae of medial and lateral tibial plateau fracture, status post hardware retrieval. 2. Probable joint effusion with some evidence of bony resorption in the region of the lateral tibial plateau which may be related to prior trauma although osteomyelitis not excluded. 3. Soft tissue edema. Electronically Signed   By: Donnal Fusi M.D.   On: 06/27/2023 05:01   DG Chest Port 1 View Result Date: 06/27/2023 EXAM: 1 VIEW(S) XRAY OF THE CHEST 06/27/2023 02:04:32 AM COMPARISON: 01/20/2023 CLINICAL HISTORY: Questionable sepsis - evaluate for abnormality. FINDINGS: LUNGS AND PLEURA: Mild interstitial edema. Small right pleural effusion. No consolidation. No pneumothorax. HEART AND MEDIASTINUM:  Cardiomegaly. BONES AND SOFT TISSUES: No acute osseous abnormality. IMPRESSION: 1. Cardiomegaly with mild interstitial edema and small right pleural effusion. Electronically signed by: Zadie Herter MD 06/27/2023 02:06 AM EDT RP Workstation: [id number]     Subjective: Patient seen examined bedside, lying in bed.  Sleeping but easily arousable.  Continues to complain of pain to his knee.  Currently awaiting insurance authorization for SNF placement.  No other questions, concerns or complaints at this time.  Denies headache, no dizziness, no chest pain, no palpitations, no shortness of breath, no abdominal pain, no fever/chills/night sweats, no nausea/vomiting/diarrhea, no focal weakness, no fatigue, no paresthesias.  No acute events overnight per nursing staff. Discharging to SNF.  Discharge Exam: Vitals:   07/07/23 0416 07/07/23 0814  BP: 116/82 116/69  Pulse: 94 66  Resp: 16 17  Temp: 98.6 F (37 C) 97.9 F (36.6 C)  SpO2: 100% 98%   Vitals:    07/06/23 1619 07/06/23 2025 07/07/23 0416 07/07/23 0814  BP: 115/72 (!) 123/90 116/82 116/69  Pulse: (!) 55 (!) 52 94 66  Resp: 16 18 16 17   Temp: 98 F (36.7 C) 98 F (36.7 C) 98.6 F (37 C) 97.9 F (36.6 C)  TempSrc: Oral  Oral Oral  SpO2: 100% 99% 100% 98%  Weight:      Height:        Physical Exam: GEN: NAD, alert and oriented x 3, chronically ill-appearing, appears older than stated age HEENT: NCAT, PERRL, EOMI, sclera clear, MMM PULM: CTAB w/o wheezes/crackles, normal respiratory effort, on room air CV: RRR w/o M/G/R GI: abd soft, NTND, + BS MSK: Right knee with Ace wrap in place, drainage noted soaking through bandage, moves all extremities independently with preserved muscle strength NEURO: CN II-XII intact, no focal deficits, sensation to light touch intact PSYCH: normal mood/affect Integumentary: Right knee Ace wrap in place with drainage from surgical site, no other concerning rashes/lesions/wounds noted on exposed skin surfaces    The results of significant diagnostics from this hospitalization (including imaging, microbiology, ancillary and laboratory) are listed below for reference.     Microbiology: Recent Results (from the past 240 hours)  Surgical PCR screen     Status: Abnormal   Collection Time: 06/27/23  4:03 PM   Specimen: Nasal Mucosa; Nasal Swab  Result Value Ref Range Status   MRSA, PCR POSITIVE (A) NEGATIVE Final    Comment: RESULT CALLED TO, READ BACK BY AND VERIFIED WITH: RN CLINT WOOTEN ON 06/27/23 @ 1818 BY DRT    Staphylococcus aureus POSITIVE (A) NEGATIVE Final    Comment: (NOTE) The Xpert SA Assay (FDA approved for NASAL specimens in patients 56 years of age and older), is one component of a comprehensive surveillance program. It is not intended to diagnose infection nor to guide or monitor treatment. Performed at [facility], [address] [place], [place] [address]   Aerobic/Anaerobic Culture w Gram Stain (surgical/deep  wound)     Status: None   Collection Time: 06/29/23 10:39 AM   Specimen: Leg, Right; Abscess  Result Value Ref Range Status   Specimen Description TISSUE  Final   Special Requests A SOFT TIS ABSCESS  Final   Gram Stain   Final    ABUNDANT WBC PRESENT, PREDOMINANTLY PMN NO ORGANISMS SEEN    Culture   Final    RARE METHICILLIN RESISTANT STAPHYLOCOCCUS AUREUS NO ANAEROBES ISOLATED Performed at [facility], [address], [place], [place] [address]    Report Status  07/04/2023 FINAL  Final   Organism ID, Bacteria METHICILLIN RESISTANT STAPHYLOCOCCUS AUREUS  Final      Susceptibility   Methicillin resistant staphylococcus aureus - MIC*    CIPROFLOXACIN >=8 RESISTANT Resistant     ERYTHROMYCIN >=8 RESISTANT Resistant     GENTAMICIN  <=0.5 SENSITIVE Sensitive     OXACILLIN >=4 RESISTANT Resistant     TETRACYCLINE >=16 RESISTANT Resistant     VANCOMYCIN  1 SENSITIVE Sensitive     TRIMETH/SULFA >=320 RESISTANT Resistant     CLINDAMYCIN  <=0.25 SENSITIVE Sensitive     RIFAMPIN <=0.5 SENSITIVE Sensitive     Inducible Clindamycin  NEGATIVE Sensitive     LINEZOLID  2 SENSITIVE Sensitive     * RARE METHICILLIN RESISTANT STAPHYLOCOCCUS AUREUS  Aerobic/Anaerobic Culture w Gram Stain (surgical/deep wound)     Status: None   Collection Time: 06/29/23 10:41 AM   Specimen: Leg, Right; Abscess  Result Value Ref Range Status   Specimen Description ABSCESS  Final   Special Requests B  Final   Gram Stain   Final    ABUNDANT WBC PRESENT, PREDOMINANTLY PMN NO ORGANISMS SEEN    Culture   Final    RARE METHICILLIN RESISTANT STAPHYLOCOCCUS AUREUS NO ANAEROBES ISOLATED Performed at [facility], [address], [place], [place] [address]    Report Status 07/04/2023 FINAL  Final   Organism ID, Bacteria METHICILLIN RESISTANT STAPHYLOCOCCUS AUREUS  Final      Susceptibility   Methicillin resistant staphylococcus aureus - MIC*    CIPROFLOXACIN >=8 RESISTANT Resistant     ERYTHROMYCIN >=8  RESISTANT Resistant     GENTAMICIN  <=0.5 SENSITIVE Sensitive     OXACILLIN >=4 RESISTANT Resistant     TETRACYCLINE >=16 RESISTANT Resistant     VANCOMYCIN  1 SENSITIVE Sensitive     TRIMETH/SULFA >=320 RESISTANT Resistant     CLINDAMYCIN  <=0.25 SENSITIVE Sensitive     RIFAMPIN <=0.5 SENSITIVE Sensitive     Inducible Clindamycin  NEGATIVE Sensitive     LINEZOLID  2 SENSITIVE Sensitive     * RARE METHICILLIN RESISTANT STAPHYLOCOCCUS AUREUS  Aerobic/Anaerobic Culture w Gram Stain (surgical/deep wound)     Status: None   Collection Time: 06/29/23 10:41 AM   Specimen: Leg, Right; Abscess  Result Value Ref Range Status   Specimen Description ABSCESS  Final   Special Requests C  Final   Gram Stain   Final    ABUNDANT WBC PRESENT, PREDOMINANTLY PMN NO ORGANISMS SEEN    Culture   Final    No growth aerobically or anaerobically. Performed at [facility], [address], [place], [place] [address]    Report Status 07/04/2023 FINAL  Final  MIC (1 Drug)-Abscess; 06/29/2023; Leg, Right; Staph aureus; Daptomycin      Status: None   Collection Time: 06/29/23 12:41 PM   Specimen: Leg, Right  Result Value Ref Range Status   Min Inhibitory Conc (1 Drug) Final report  Corrected    Comment: (NOTE) Performed At: [facility] [address] [place], [place] [id number] Pearlean Botts MD WG:[id number] CORRECTED ON 05/07 AT 0736: PREVIOUSLY REPORTED AS Preliminary report    Source ABSCESS  Final    Comment: Performed at [facility], [address], [place], [place] [address]  MIC Result     Status: None   Collection Time: 06/29/23 12:41 PM  Result Value Ref Range Status   Result 1 (MIC) Staphylococcus aureus  Final    Comment: (NOTE) Identification performed by account, not confirmed by this laboratory.  DAPTOMYCIN     <=1 UG/ML = SUSCEPTIBLE Performed At: [facility] [address] [place], [place] [id number] Pearlean Botts MD WU:[id number]      Labs: BNP (last 3  results) Recent Labs    09/30/22 2333  BNP 197.7*   Basic Metabolic Panel: Recent Labs  Lab 07/01/23 1241 07/02/23 0406 07/03/23 0201 07/04/23 1053 07/05/23 0645  NA 132* 132* 134* 132* 134*  K 5.2* 4.1 4.1 5.4* 4.5  CL 101 101 102 100 100  CO2 25 25 27 28 28   GLUCOSE 134* 132* 129* 153* 107*  BUN 20 20 17 15 13   CREATININE 0.72 0.69 0.70 0.73 0.62  CALCIUM  9.4 8.7* 8.4* 9.0 8.2*  MG 1.9 1.7 1.8 1.8 1.8  PHOS 3.3 3.1 3.1 3.1 3.0   Liver Function Tests: Recent Labs  Lab 07/01/23 1241 07/02/23 0406 07/03/23 0201 07/04/23 1053 07/05/23 0645  AST 24 23 22 26 19   ALT 13 12 14 17 14   ALKPHOS 71 68 77 92 71  BILITOT 1.3* 1.2 0.9 1.3* 1.2  PROT 7.1 6.3* 5.8* 6.9 5.7*  ALBUMIN 2.2* 2.0* 1.8* 2.3* 1.8*   No results for input(s): "LIPASE", "AMYLASE" in the last 168 hours. No results for input(s): "AMMONIA" in the last 168 hours. CBC: Recent Labs  Lab 07/01/23 1241 07/02/23 0406 07/03/23 0201 07/04/23 1053 07/05/23 0645  WBC 10.5 8.7 8.2 8.7 6.9  NEUTROABS 8.4* 6.3 5.4 6.3 5.3  HGB 12.4* 11.5* 11.1* 12.9* 11.1*  HCT 38.0* 35.5* 34.2* 39.6 33.7*  MCV 85.8 86.6 87.9 88.0 85.3  PLT 371 331 274 328 211   Cardiac Enzymes: No results for input(s): "CKTOTAL", "CKMB", "CKMBINDEX", "TROPONINI" in the last 168 hours. BNP: Invalid input(s): "POCBNP" CBG: No results for input(s): "GLUCAP" in the last 168 hours. D-Dimer No results for input(s): "DDIMER" in the last 72 hours. Hgb A1c No results for input(s): "HGBA1C" in the last 72 hours. Lipid Profile No results for input(s): "CHOL", "HDL", "LDLCALC", "TRIG", "CHOLHDL", "LDLDIRECT" in the last 72 hours. Thyroid function studies No results for input(s): "TSH", "T4TOTAL", "T3FREE", "THYROIDAB" in the last 72 hours.  Invalid input(s): "FREET3" Anemia work up No results for input(s): "VITAMINB12", "FOLATE", "FERRITIN", "TIBC", "IRON", "RETICCTPCT" in the last 72 hours. Urinalysis    Component Value Date/Time    COLORURINE AMBER (A) 10/01/2022 0142   APPEARANCEUR CLOUDY (A) 10/01/2022 0142   LABSPEC 1.025 10/01/2022 0142   PHURINE 5.0 10/01/2022 0142   GLUCOSEU NEGATIVE 10/01/2022 0142   HGBUR SMALL (A) 10/01/2022 0142   BILIRUBINUR NEGATIVE 10/01/2022 0142   KETONESUR NEGATIVE 10/01/2022 0142   PROTEINUR 30 (A) 10/01/2022 0142   NITRITE NEGATIVE 10/01/2022 0142   LEUKOCYTESUR LARGE (A) 10/01/2022 0142   Sepsis Labs Recent Labs  Lab 07/02/23 0406 07/03/23 0201 07/04/23 1053 07/05/23 0645  WBC 8.7 8.2 8.7 6.9   Microbiology Recent Results (from the past 240 hours)  Surgical PCR screen     Status: Abnormal   Collection Time: 06/27/23  4:03 PM   Specimen: Nasal Mucosa; Nasal Swab  Result Value Ref Range Status   MRSA, PCR POSITIVE (A) NEGATIVE Final    Comment: RESULT CALLED TO, READ BACK BY AND VERIFIED WITH: RN CLINT WOOTEN ON 06/27/23 @ 1818 BY DRT    Staphylococcus aureus POSITIVE (A) NEGATIVE Final    Comment: (NOTE) The Xpert SA Assay (FDA approved for NASAL specimens in patients 80 years of age and older), is one component of a comprehensive surveillance program. It is not intended to  diagnose infection nor to guide or monitor treatment. Performed at [facility], [address] [place], [place] [address]   Aerobic/Anaerobic Culture w Gram Stain (surgical/deep wound)     Status: None   Collection Time: 06/29/23 10:39 AM   Specimen: Leg, Right; Abscess  Result Value Ref Range Status   Specimen Description TISSUE  Final   Special Requests A SOFT TIS ABSCESS  Final   Gram Stain   Final    ABUNDANT WBC PRESENT, PREDOMINANTLY PMN NO ORGANISMS SEEN    Culture   Final    RARE METHICILLIN RESISTANT STAPHYLOCOCCUS AUREUS NO ANAEROBES ISOLATED Performed at [facility], [address] [place], [place] [address]    Report Status 07/04/2023 FINAL  Final   Organism ID, Bacteria METHICILLIN RESISTANT STAPHYLOCOCCUS AUREUS  Final      Susceptibility   Methicillin  resistant staphylococcus aureus - MIC*    CIPROFLOXACIN >=8 RESISTANT Resistant     ERYTHROMYCIN >=8 RESISTANT Resistant     GENTAMICIN  <=0.5 SENSITIVE Sensitive     OXACILLIN >=4 RESISTANT Resistant     TETRACYCLINE >=16 RESISTANT Resistant     VANCOMYCIN  1 SENSITIVE Sensitive     TRIMETH/SULFA >=320 RESISTANT Resistant     CLINDAMYCIN  <=0.25 SENSITIVE Sensitive     RIFAMPIN <=0.5 SENSITIVE Sensitive     Inducible Clindamycin  NEGATIVE Sensitive     LINEZOLID  2 SENSITIVE Sensitive     * RARE METHICILLIN RESISTANT STAPHYLOCOCCUS AUREUS  Aerobic/Anaerobic Culture w Gram Stain (surgical/deep wound)     Status: None   Collection Time: 06/29/23 10:41 AM   Specimen: Leg, Right; Abscess  Result Value Ref Range Status   Specimen Description ABSCESS  Final   Special Requests B  Final   Gram Stain   Final    ABUNDANT WBC PRESENT, PREDOMINANTLY PMN NO ORGANISMS SEEN    Culture   Final    RARE METHICILLIN RESISTANT STAPHYLOCOCCUS AUREUS NO ANAEROBES ISOLATED Performed at [facility], [address], [place], [place] [address]    Report Status 07/04/2023 FINAL  Final   Organism ID, Bacteria METHICILLIN RESISTANT STAPHYLOCOCCUS AUREUS  Final      Susceptibility   Methicillin resistant staphylococcus aureus - MIC*    CIPROFLOXACIN >=8 RESISTANT Resistant     ERYTHROMYCIN >=8 RESISTANT Resistant     GENTAMICIN  <=0.5 SENSITIVE Sensitive     OXACILLIN >=4 RESISTANT Resistant     TETRACYCLINE >=16 RESISTANT Resistant     VANCOMYCIN  1 SENSITIVE Sensitive     TRIMETH/SULFA >=320 RESISTANT Resistant     CLINDAMYCIN  <=0.25 SENSITIVE Sensitive     RIFAMPIN <=0.5 SENSITIVE Sensitive     Inducible Clindamycin  NEGATIVE Sensitive     LINEZOLID  2 SENSITIVE Sensitive     * RARE METHICILLIN RESISTANT STAPHYLOCOCCUS AUREUS  Aerobic/Anaerobic Culture w Gram Stain (surgical/deep wound)     Status: None   Collection Time: 06/29/23 10:41 AM   Specimen: Leg, Right; Abscess  Result Value Ref Range  Status   Specimen Description ABSCESS  Final   Special Requests C  Final   Gram Stain   Final    ABUNDANT WBC PRESENT, PREDOMINANTLY PMN NO ORGANISMS SEEN    Culture   Final    No growth aerobically or anaerobically. Performed at [facility], [address], [place], [place] [address]    Report Status 07/04/2023 FINAL  Final  MIC (1 Drug)-Abscess; 06/29/2023; Leg, Right; Staph aureus; Daptomycin      Status: None  Collection Time: 06/29/23 12:41 PM   Specimen: Leg, Right  Result Value Ref Range Status   Min Inhibitory Conc (1 Drug) Final report  Corrected    Comment: (NOTE) Performed At: [facility] [address] [place], [place] [id number] Pearlean Botts MD PP:[id number] CORRECTED ON 05/07 AT 0736: PREVIOUSLY REPORTED AS Preliminary report    Source ABSCESS  Final    Comment: Performed at [facility], [address] [place], [place] [address]  MIC Result     Status: None   Collection Time: 06/29/23 12:41 PM  Result Value Ref Range Status   Result 1 (MIC) Staphylococcus aureus  Final    Comment: (NOTE) Identification performed by account, not confirmed by this laboratory. DAPTOMYCIN     <=1 UG/ML = SUSCEPTIBLE Performed At: [facility] [address] [place], [place] [id number] Pearlean Botts MD TD:[id number]      Time coordinating discharge: Over 30 minutes  SIGNED:   Rema Care [place], DO  [facility] 07/07/2023, 1:24 PM

## 2023-07-07 NOTE — Progress Notes (Signed)
 PROGRESS NOTE    Jonathan Ibarra  ZOX:[id number] DOB: [birth year] DOA: 06/26/2023 PCP: Sharry Deem, MD    Brief Narrative:   Jonathan Ibarra is a 58 y.o. male with past medical history significant for tibial plateau fracture, right knee septic arthritis with hardware removal, I&D, wound VAC placement February 2025, HTN, SVT, paroxysmal atrial fibrillation not on anticoagulation, cirrhosis s/p TIPS, chronic thrombocytopenia, anemia of chronic medical disease, polysubstance abuse who presented to [facility] ED on 06/26/2023 with right knee pain, swelling, drainage.  Reports in his usual state of health until this past week when he noticed increased pain, swelling, erythema and puslike drainage from his right knee.  Admitting to "not taking care of his knee".  Did not report to the infusion clinic for antibiotic administration March 2025 as planned and has not followed up with ID.  In the ED, temperature 97.8 F, HR 153, RR 26, BP 126/91, SpO2 100% on room air.  WBC 6.9, hemoglobin 13.0, platelet count 298.  Sodium 138, potassium 3.1, chloride 104, CO2 24, glucose 56, BUN 9, creatinine 0.71.  AST 24, ALT 12, total bilirubin 1.2.  INR 1.2.  UDS positive for amphetamines, opiates, THC.  Chest x-ray with cardiomegaly with mild interstitial edema and small right pleural effusion.  Right knee x-ray with sequelae of medial/lateral tibial plateau fracture status post hardware removal, probable joint effusion with some evidence of bony reabsorption in the lateral tibial plateau related to prior trauma versus osteomyelitis.  MR right knee without contrast with enlarging complex knee joint effusion with diffuse periarticular marrow edema suspicious for septic arthritis and osteomyelitis, probable associated intraosseous abscess proximal tibia with fistulous tract communicating with a lateral soft tissue component, extensive periarticular soft tissue edema surrounding the knee.  Blood cultures x 2 obtained.  Patient  was started on a Cardizem  drip for A-fib with RVR.  Orthopedics consulted.  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  Noncompliant with outpatient therapy including IV antibiotics and follow-up with infectious disease.  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  Patient is afebrile without leukocytosis.  MR right knee with findings consistent with acute septic arthritis with concern for osteomyelitis and abscess formation.  Seen by orthopedics, Dr. Curtiss Dowdy who initially recommended AKA, but patient declined.  Patient underwent excision of intraosseous abscess right proximal tibia with I&D right leg abscess and placement of antibiotic beads intraosseous on 06/29/2023.  Superficial leg culture and operative culture positive for MRSA.  Blood cultures x 2 negative for 5 days.  Patient was initially placed on IV antibiotics with vancomycin  with plan 4-week course with plan to transition to oritavancin  to complete antibiotic course followed by oral suppression outpatient; however patient continued to pull out multiple IV lines during hospitalization and ID transition patient to linezolid  600 mg p.o. twice daily. -- Continue linezolid  600 mg p.o. twice daily -- Postoperative wound care per orthopedics, WBAT; no shower -- Outpatient follow-up with orthopedics ([facility]) and infectious disease in 2 weeks -- Oxycodone  15-20 mg p.o. every 4 hours as needed moderate/severe pain -- Dilaudid  1 mg IV every 4 hours as needed severe pain not relieved with oral medication -- Contact precautions -- Pending discharge to SNF awaiting insurance authorization  Paroxysmal  atrial fibrillation with RVR Essential hypertension Patient presenting with elevated heart rate of 153.  Initially placed on Cardizem  drip.  Home medications restarted and was able to be titrated off.  Not on anticoagulation outpatient. -- Diltiazem  180 mg p.o. daily -- Metoprolol  succinate 50 mg p.o. daily  Cirrhosis s/p TIPS -- Low-salt diet -- Outpatient follow-up with GI  Anemia of chronic medical disease -- Hgb 11.1, stable.  Hyperkalemia Potassium 5.4 and given 1 dose of Lokelma , now resolved with potassium 4.5.  Polysubstance use UDS positive for amphetamines, opiates, THC.  Counseled on need for complete abstinence/cessation from illicit substances.  Obesity, class I BMI 33.64 kg/m   DVT prophylaxis: SCDs Start: 06/29/23 1203    Code Status: Full Code Family Communication: No family present at bedside this morning  Disposition Plan:  Level of care: Med-Surg Status is: Inpatient Remains inpatient appropriate because: Pending insurance authorization for SNF placement    Consultants:  Orthopedics, Dr. Curtiss Dowdy Infectious disease  Procedures:  None  Antimicrobials:  Vancomycin  4/27 - 5/4 Ceftriaxone  4/27 - 4/27 Linezolid  5/6>>   Subjective: Patient seen examined bedside, lying in bed.  Sleeping but easily arousable.  Continues to complain of pain to his knee.  Currently awaiting insurance authorization for SNF placement.  No other questions, concerns or complaints at this time.  Denies headache, no dizziness, no chest pain, no palpitations, no shortness of breath, no abdominal pain, no fever/chills/night sweats, no nausea/vomiting/diarrhea, no focal weakness, no fatigue, no paresthesias.  No acute events overnight per nursing staff.  Objective: Vitals:   07/06/23 1619 07/06/23 2025 07/07/23 0416 07/07/23 0814  BP: 115/72 (!) 123/90 116/82 116/69  Pulse: (!) 55 (!) 52 94 66  Resp: 16 18 16 17   Temp: 98 F (36.7 C) 98 F (36.7 C) 98.6 F (37 C) 97.9 F  (36.6 C)  TempSrc: Oral  Oral Oral  SpO2: 100% 99% 100% 98%  Weight:      Height:        Intake/Output Summary (Last 24 hours) at 07/07/2023 1052 Last data filed at 07/07/2023 1000 Gross per 24 hour  Intake 10 ml  Output 825 ml  Net -815 ml   Filed Weights   06/26/23 2340  Weight: 115.7 kg    Examination:  Physical Exam: GEN: NAD, alert and oriented x 3, chronically ill-appearing, appears older than stated age HEENT: NCAT, PERRL, EOMI, sclera clear, MMM PULM: CTAB w/o wheezes/crackles, normal respiratory effort, on room air CV: RRR w/o M/G/R GI: abd soft, NTND, + BS MSK: Right knee with Ace wrap in place, drainage noted soaking through bandage, moves all extremities independently with preserved muscle strength NEURO: CN II-XII intact, no focal deficits, sensation to light touch intact PSYCH: normal mood/affect Integumentary: Right knee Ace wrap in place with drainage from surgical site, no other concerning rashes/lesions/wounds noted on exposed skin surfaces    Data Reviewed: I have personally reviewed following labs and imaging studies  CBC: Recent Labs  Lab 07/01/23 1241 07/02/23 0406 07/03/23 0201 07/04/23 1053 07/05/23 0645  WBC 10.5 8.7 8.2 8.7 6.9  NEUTROABS 8.4* 6.3 5.4 6.3 5.3  HGB 12.4* 11.5* 11.1* 12.9* 11.1*  HCT 38.0* 35.5* 34.2* 39.6 33.7*  MCV 85.8 86.6 87.9 88.0 85.3  PLT 371 331 274 328 211   Basic Metabolic Panel: Recent Labs  Lab 07/01/23 1241 07/02/23 0406 07/03/23 0201 07/04/23 1053 07/05/23 0645  NA 132* 132* 134* 132* 134*  K 5.2* 4.1 4.1 5.4* 4.5  CL 101  101 102 100 100  CO2 25 25 27 28 28   GLUCOSE 134* 132* 129* 153* 107*  BUN 20 20 17 15 13   CREATININE 0.72 0.69 0.70 0.73 0.62  CALCIUM  9.4 8.7* 8.4* 9.0 8.2*  MG 1.9 1.7 1.8 1.8 1.8  PHOS 3.3 3.1 3.1 3.1 3.0   GFR: Estimated Creatinine Clearance: 137.4 mL/min (by C-G formula based on SCr of 0.62 mg/dL). Liver Function Tests: Recent Labs  Lab 07/01/23 1241 07/02/23 0406  07/03/23 0201 07/04/23 1053 07/05/23 0645  AST 24 23 22 26 19   ALT 13 12 14 17 14   ALKPHOS 71 68 77 92 71  BILITOT 1.3* 1.2 0.9 1.3* 1.2  PROT 7.1 6.3* 5.8* 6.9 5.7*  ALBUMIN 2.2* 2.0* 1.8* 2.3* 1.8*   No results for input(s): "LIPASE", "AMYLASE" in the last 168 hours. No results for input(s): "AMMONIA" in the last 168 hours.  Coagulation Profile: No results for input(s): "INR", "PROTIME" in the last 168 hours.  Cardiac Enzymes: No results for input(s): "CKTOTAL", "CKMB", "CKMBINDEX", "TROPONINI" in the last 168 hours. BNP (last 3 results) No results for input(s): "PROBNP" in the last 8760 hours. HbA1C: No results for input(s): "HGBA1C" in the last 72 hours.  CBG: No results for input(s): "GLUCAP" in the last 168 hours.  Lipid Profile: No results for input(s): "CHOL", "HDL", "LDLCALC", "TRIG", "CHOLHDL", "LDLDIRECT" in the last 72 hours. Thyroid Function Tests: No results for input(s): "TSH", "T4TOTAL", "FREET4", "T3FREE", "THYROIDAB" in the last 72 hours. Anemia Panel: Recent Labs    07/04/23 1053  VITAMINB12 578  FOLATE 8.6  FERRITIN 83  TIBC 224*  IRON 50  RETICCTPCT 2.2   Sepsis Labs: No results for input(s): "PROCALCITON", "LATICACIDVEN" in the last 168 hours.   Recent Results (from the past 240 hours)  Surgical PCR screen     Status: Abnormal   Collection Time: 06/27/23  4:03 PM   Specimen: Nasal Mucosa; Nasal Swab  Result Value Ref Range Status   MRSA, PCR POSITIVE (A) NEGATIVE Final    Comment: RESULT CALLED TO, READ BACK BY AND VERIFIED WITH: RN CLINT WOOTEN ON 06/27/23 @ 1818 BY DRT    Staphylococcus aureus POSITIVE (A) NEGATIVE Final    Comment: (NOTE) The Xpert SA Assay (FDA approved for NASAL specimens in patients 48 years of age and older), is one component of a comprehensive surveillance program. It is not intended to diagnose infection nor to guide or monitor treatment. Performed at [facility], [address] [place],  [place] [address]   Aerobic/Anaerobic Culture w Gram Stain (surgical/deep wound)     Status: None   Collection Time: 06/29/23 10:39 AM   Specimen: Leg, Right; Abscess  Result Value Ref Range Status   Specimen Description TISSUE  Final   Special Requests A SOFT TIS ABSCESS  Final   Gram Stain   Final    ABUNDANT WBC PRESENT, PREDOMINANTLY PMN NO ORGANISMS SEEN    Culture   Final    RARE METHICILLIN RESISTANT STAPHYLOCOCCUS AUREUS NO ANAEROBES ISOLATED Performed at [facility], [address], [place], [place] [address]    Report Status 07/04/2023 FINAL  Final   Organism ID, Bacteria METHICILLIN RESISTANT STAPHYLOCOCCUS AUREUS  Final      Susceptibility   Methicillin resistant staphylococcus aureus - MIC*    CIPROFLOXACIN >=8 RESISTANT Resistant     ERYTHROMYCIN >=8 RESISTANT Resistant     GENTAMICIN  <=0.5 SENSITIVE Sensitive     OXACILLIN >=4 RESISTANT Resistant  TETRACYCLINE >=16 RESISTANT Resistant     VANCOMYCIN  1 SENSITIVE Sensitive     TRIMETH/SULFA >=320 RESISTANT Resistant     CLINDAMYCIN  <=0.25 SENSITIVE Sensitive     RIFAMPIN <=0.5 SENSITIVE Sensitive     Inducible Clindamycin  NEGATIVE Sensitive     LINEZOLID  2 SENSITIVE Sensitive     * RARE METHICILLIN RESISTANT STAPHYLOCOCCUS AUREUS  Aerobic/Anaerobic Culture w Gram Stain (surgical/deep wound)     Status: None   Collection Time: 06/29/23 10:41 AM   Specimen: Leg, Right; Abscess  Result Value Ref Range Status   Specimen Description ABSCESS  Final   Special Requests B  Final   Gram Stain   Final    ABUNDANT WBC PRESENT, PREDOMINANTLY PMN NO ORGANISMS SEEN    Culture   Final    RARE METHICILLIN RESISTANT STAPHYLOCOCCUS AUREUS NO ANAEROBES ISOLATED Performed at [facility], [address] [place], [place] [address]    Report Status 07/04/2023 FINAL  Final   Organism ID, Bacteria METHICILLIN RESISTANT STAPHYLOCOCCUS AUREUS  Final      Susceptibility   Methicillin resistant staphylococcus aureus - MIC*     CIPROFLOXACIN >=8 RESISTANT Resistant     ERYTHROMYCIN >=8 RESISTANT Resistant     GENTAMICIN  <=0.5 SENSITIVE Sensitive     OXACILLIN >=4 RESISTANT Resistant     TETRACYCLINE >=16 RESISTANT Resistant     VANCOMYCIN  1 SENSITIVE Sensitive     TRIMETH/SULFA >=320 RESISTANT Resistant     CLINDAMYCIN  <=0.25 SENSITIVE Sensitive     RIFAMPIN <=0.5 SENSITIVE Sensitive     Inducible Clindamycin  NEGATIVE Sensitive     LINEZOLID  2 SENSITIVE Sensitive     * RARE METHICILLIN RESISTANT STAPHYLOCOCCUS AUREUS  Aerobic/Anaerobic Culture w Gram Stain (surgical/deep wound)     Status: None   Collection Time: 06/29/23 10:41 AM   Specimen: Leg, Right; Abscess  Result Value Ref Range Status   Specimen Description ABSCESS  Final   Special Requests C  Final   Gram Stain   Final    ABUNDANT WBC PRESENT, PREDOMINANTLY PMN NO ORGANISMS SEEN    Culture   Final    No growth aerobically or anaerobically. Performed at [facility], [address] [place], [place] [address]    Report Status 07/04/2023 FINAL  Final  MIC (1 Drug)-Abscess; 06/29/2023; Leg, Right; Staph aureus; Daptomycin      Status: None   Collection Time: 06/29/23 12:41 PM   Specimen: Leg, Right  Result Value Ref Range Status   Min Inhibitory Conc (1 Drug) Final report  Corrected    Comment: (NOTE) Performed At: [facility] [address] [place], [place] [id number] Pearlean Botts MD BM:[id number] CORRECTED ON 05/07 AT 0736: PREVIOUSLY REPORTED AS Preliminary report    Source ABSCESS  Final    Comment: Performed at [facility], [address] [place], [place] [address]  MIC Result     Status: None   Collection Time: 06/29/23 12:41 PM  Result Value Ref Range Status   Result 1 (MIC) Staphylococcus aureus  Final    Comment: (NOTE) Identification performed by account, not confirmed by this laboratory. DAPTOMYCIN     <=1 UG/ML = SUSCEPTIBLE Performed At: [facility] [address] [place], [place]  [id number] Pearlean Botts MD QV:[id number]          Radiology Studies: No results found.       Scheduled Meds:  aspirin   325 mg Oral Daily   diltiazem   180 mg Oral Daily   docusate  sodium  100 mg Oral BID   linezolid   600 mg Oral Q12H   metoprolol  succinate  50 mg Oral Daily   sodium chloride  flush  10-40 mL Intracatheter Q12H   Continuous Infusions:     LOS: 10 days    Time spent: 45 minutes spent on 07/07/2023 caring for this patient face-to-face including chart review, ordering labs/tests, documenting, discussion with nursing staff, consultants, updating family and interview/physical exam    Rema Care [place], DO [facility] Available via Epic secure chat 7am-7pm After these hours, please refer to coverage provider listed on [email or web address] 07/07/2023, 10:52 AM

## 2023-07-20 ENCOUNTER — Other Ambulatory Visit (HOSPITAL_COMMUNITY): Payer: Self-pay

## 2023-07-20 NOTE — Progress Notes (Deleted)
 Subjective:   [name], male    DOB: [birth year], 58 y.o.   MRN: [id number]  No chief complaint on file.   HPI:  Jonathan Ibarra is a 58 y.o. male with previous history of tibial plateau fracture complicated by septic arthritis status post hardware removal and recent recurrent hospitalization for right tibial osteomyelitis with intraosseous abscess with cultures positive for Staph aureus.  Removed multiple IV lines while in the hospital and not felt to be a candidate for home IV therapy with plan for 6 weeks of oral linezolid  600 mg p.o. twice daily.  Last seen on 07/07/2023 with good adherence and tolerance to linezolid  with plan disposition to skilled nursing facility.Jonathan Ibarra  Here today for routine follow-up.   Allergies  Allergen Reactions   Tylenol  [Acetaminophen ] Other (See Comments)    Impacts liver      Outpatient Medications Prior to Visit  Medication Sig Dispense Refill   aspirin  325 MG tablet Take 1 tablet (325 mg total) by mouth daily. 30 tablet 0   chlorhexidine  (HIBICLENS ) 4 % external liquid Apply 15 mLs (1 Application total) topically as directed for 30 doses. Use as directed daily for 5 days every other week for 6 weeks. 946 mL 1   diltiazem  (CARDIZEM  CD) 180 MG 24 hr capsule Take 1 capsule (180 mg total) by mouth daily. 30 capsule 0   docusate sodium  (COLACE) 100 MG capsule Take 1 capsule (100 mg total) by mouth 2 (two) times daily.     linezolid  (ZYVOX ) 600 MG tablet Take 1 tablet (600 mg total) by mouth every 12 (twelve) hours. 70 tablet 0   methocarbamol  (ROBAXIN ) 750 MG tablet Take 1 tablet (750 mg total) by mouth every 8 (eight) hours as needed for muscle spasms. 30 tablet 0   metoprolol  succinate (TOPROL -XL) 50 MG 24 hr tablet Take 1 tablet (50 mg total) by mouth daily. Take with or immediately following a meal. 30 tablet 1   mupirocin  ointment (BACTROBAN ) 2 % Place 1 Application into the nose 2 (two) times daily for 60 doses. Use as directed 2  times daily for 5 days every other week for 6 weeks. 60 g 0   oxyCODONE  (ROXICODONE ) 15 MG immediate release tablet Take 1 tablet (15 mg total) by mouth every 4 (four) hours as needed. 42 tablet 0   oxyCODONE  (ROXICODONE ) 15 MG immediate release tablet Take 1 tablet (15 mg total) by mouth every 4 (four) hours as needed for moderate pain (pain score 4-6) or severe pain (pain score 7-10). 60 tablet 0   No facility-administered medications prior to visit.     Past Medical History:  Diagnosis Date   Alcohol abuse    Cirrhosis of liver (HCC)    Hypertension    PAF (paroxysmal atrial fibrillation) (HCC) 12/28/2022   SVT (supraventricular tachycardia) (HCC) 09/2022     Past Surgical History:  Procedure Laterality Date   CHOLECYSTECTOMY     HARDWARE REMOVAL Right 04/20/2023   Procedure: HARDWARE REMOVAL Knee;  Surgeon: Laneta Pintos, MD;  Location: [facility];  Service: Orthopedics;  Laterality: Right;   IR TIPS     IRRIGATION AND DEBRIDEMENT KNEE Right 12/29/2022   Procedure: IRRIGATION AND DEBRIDEMENT KNEE;  Surgeon: Laneta Pintos, MD;  Location: [facility] OR;  Service: Orthopedics;  Laterality: Right;   IRRIGATION AND DEBRIDEMENT KNEE Right 01/12/2023   Procedure: IRRIGATION AND DEBRIDEMENT KNEE;  Surgeon: Laneta Pintos, MD;  Location: [facility];  Service: Orthopedics;  Laterality: Right;   IRRIGATION AND DEBRIDEMENT KNEE Right 06/29/2023   Procedure: IRRIGATION AND DEBRIDEMENT KNEE;  Surgeon: Laneta Pintos, MD;  Location: [facility];  Service: Orthopedics;  Laterality: Right;   ORIF TIBIA PLATEAU Right 12/13/2022   Procedure: OPEN REDUCTION INTERNAL FIXATION (ORIF) TIBIAL PLATEAU;  Surgeon: Laneta Pintos, MD;  Location: [facility];  Service: Orthopedics;  Laterality: Right;   TRANSESOPHAGEAL ECHOCARDIOGRAM (CATH LAB) N/A 04/21/2023   Procedure: TRANSESOPHAGEAL ECHOCARDIOGRAM;  Surgeon: Sheryle Donning, MD;  Location: [facility] INVASIVE CV LAB;  Service: Cardiovascular;  Laterality: N/A;       Review  of Systems  Objective:   There were no vitals taken for this visit. Nursing note and vital signs reviewed.  Physical Exam       No data to display           Assessment & Plan:    Patient Active Problem List   Diagnosis Date Noted   Osteomyelitis (HCC) 06/30/2023   Prosthetic joint infection, subsequent encounter 06/27/2023   Bacteremia 04/21/2023   MRSA bacteremia 04/17/2023   Effusion of right knee joint 04/17/2023   Septic joint of right knee joint (HCC) 04/16/2023   Atrial fibrillation, chronic (HCC) 04/16/2023   Wound infection complicating hardware, subsequent encounter 02/04/2023   Drainage from wound 02/04/2023   Knee effusion, right 12/28/2022   Septic arthritis of knee, right (HCC) 12/28/2022   Fall from bridge, initial encounter 12/13/2022   Atrial fibrillation with rapid ventricular response (HCC) 10/01/2022   SIRS without infection with organ dysfunction (HCC) 10/01/2022   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 Locicero maintain his diltiazem , metoprolol  succinate, mupirocin  ointment, chlorhexidine , aspirin , oxyCODONE , methocarbamol , docusate sodium , linezolid , and oxyCODONE .   No orders of the defined types were placed in this encounter.    Follow-up: No follow-ups on file. or sooner if needed.   Marlan Silva, MSN, FNP-C Nurse Practitioner [facility] Main number: [phone]

## 2023-07-21 ENCOUNTER — Inpatient Hospital Stay: Admitting: Family

## 2023-07-28 ENCOUNTER — Encounter (HOSPITAL_COMMUNITY): Payer: Self-pay | Admitting: Internal Medicine

## 2023-07-31 HISTORY — PX: UPPER GI ENDOSCOPY: SHX6162

## 2023-08-03 HISTORY — PX: COLONOSCOPY WITH ESOPHAGOGASTRODUODENOSCOPY (EGD): SHX5779

## 2023-09-28 ENCOUNTER — Other Ambulatory Visit (HOSPITAL_COMMUNITY): Payer: Self-pay | Admitting: Student

## 2023-09-28 DIAGNOSIS — M869 Osteomyelitis, unspecified: Secondary | ICD-10-CM

## 2023-09-30 ENCOUNTER — Ambulatory Visit (HOSPITAL_COMMUNITY)

## 2023-10-07 ENCOUNTER — Encounter (HOSPITAL_COMMUNITY): Payer: Self-pay

## 2023-10-07 ENCOUNTER — Ambulatory Visit (HOSPITAL_COMMUNITY): Admission: RE | Admit: 2023-10-07 | Source: Ambulatory Visit

## 2023-10-29 ENCOUNTER — Ambulatory Visit (HOSPITAL_COMMUNITY)
Admission: RE | Admit: 2023-10-29 | Discharge: 2023-10-29 | Disposition: A | Discharge status: Home / Self Care | Source: Ambulatory Visit | Attending: Student | Admitting: Student

## 2023-10-29 DIAGNOSIS — M869 Osteomyelitis, unspecified: Secondary | ICD-10-CM | POA: Insufficient documentation

## 2023-11-01 DIAGNOSIS — F1910 Other psychoactive substance abuse, uncomplicated: Secondary | ICD-10-CM

## 2023-11-01 HISTORY — DX: Other psychoactive substance abuse, uncomplicated: F19.10

## 2023-11-11 ENCOUNTER — Encounter (HOSPITAL_COMMUNITY): Payer: Self-pay | Admitting: Internal Medicine

## 2023-11-21 ENCOUNTER — Ambulatory Visit: Payer: Self-pay | Admitting: Student

## 2023-11-21 ENCOUNTER — Encounter (HOSPITAL_COMMUNITY): Payer: Self-pay | Admitting: Internal Medicine

## 2023-11-28 ENCOUNTER — Encounter (HOSPITAL_COMMUNITY): Payer: Self-pay | Admitting: Internal Medicine

## 2023-11-29 ENCOUNTER — Encounter (HOSPITAL_COMMUNITY): Payer: Self-pay | Admitting: Student

## 2023-11-29 ENCOUNTER — Other Ambulatory Visit: Payer: Self-pay

## 2023-11-29 NOTE — H&P (Signed)
 Orthopaedic Trauma Service (OTS) H&P  [name] MRN: [id number] DOB/AGE: 57-May-1967 57 y.o.  Reason for Surgery: Osteomyelitis right tibia  HPI: Jonathan Ibarra is a 58 y.o. male with history of alcoholic cirrhosis s/p TIPS [09/2014] for medically refractory ascites [09/2014] complicated by hepatic encephalopathy on lactulose , seizure disorder, depression, GERD presenting for surgery on right lower extremity.  Patient sustained a fall in October 2024, resulting in a right tibial plateau fracture.  He initially underwent ORIF of the right tibial plateau by Dr. Kendal on 12/15/2022.  Patient has had a very complex postoperative course involving multiple falls and development of septic arthritis and osteomyelitis with MRSA and ESBL pathogens.  He has been on several rounds of both IV and oral antibiotics under the direction of the infectious disease team.  Over the last 12 months, patient has intermittently been noncompliant with follow-up.  Unfortunately patient continues to have infection involving the right tibia and imaging continues to show signs of osteomyelitis.  He has chronic pain in the right lower extremity.  He presents now for above-knee amputation. Patient currently on Doxycycline . Not on any anticoagulation  Past Medical History:  Diagnosis Date   Alcohol abuse    Anemia    transfusion 08/04/23 in [place]   Cirrhosis of liver (HCC)    Constipation    GERD (gastroesophageal reflux disease)    H/O ETOH abuse    Hepatitis    Hep C w/o coma, chronic - treated   Hypertension    Nicotine  dependence    PAF (paroxysmal atrial fibrillation) (HCC) 12/28/2022   Pyogenic arthritis (HCC)    leg right   Substance abuse (HCC) 11/01/2023   Polysubstance abuse - admitted in [facility]   SVT (supraventricular tachycardia) 09/2022   Past Surgical History:  Procedure Laterality Date   CHOLECYSTECTOMY     COLONOSCOPY  2017   COLONOSCOPY WITH ESOPHAGOGASTRODUODENOSCOPY (EGD)   08/03/2023   in [facility]   HARDWARE REMOVAL Right 04/20/2023   Procedure: HARDWARE REMOVAL Knee;  Surgeon: Kendal Franky SQUIBB, MD;  Location: [facility];  Service: Orthopedics;  Laterality: Right;   IR TIPS     IRRIGATION AND DEBRIDEMENT KNEE Right 12/29/2022   Procedure: IRRIGATION AND DEBRIDEMENT KNEE;  Surgeon: Kendal Franky SQUIBB, MD;  Location: [facility];  Service: Orthopedics;  Laterality: Right;   IRRIGATION AND DEBRIDEMENT KNEE Right 01/12/2023   Procedure: IRRIGATION AND DEBRIDEMENT KNEE;  Surgeon: Kendal Franky SQUIBB, MD;  Location: [facility];  Service: Orthopedics;  Laterality: Right;   IRRIGATION AND DEBRIDEMENT KNEE Right 06/29/2023   Procedure: IRRIGATION AND DEBRIDEMENT KNEE;  Surgeon: Kendal Franky SQUIBB, MD;  Location: [facility];  Service: Orthopedics;  Laterality: Right;   ORIF TIBIA PLATEAU Right 12/13/2022   Procedure: OPEN REDUCTION INTERNAL FIXATION (ORIF) TIBIAL PLATEAU;  Surgeon: Kendal Franky SQUIBB, MD;  Location: [facility];  Service: Orthopedics;  Laterality: Right;   TRANSESOPHAGEAL ECHOCARDIOGRAM (CATH LAB) N/A 04/21/2023   Procedure: TRANSESOPHAGEAL ECHOCARDIOGRAM;  Surgeon: Lonni Slain, MD;  Location: [facility] INVASIVE CV LAB;  Service: Cardiovascular;  Laterality: N/A;   UPPER GI ENDOSCOPY  07/2023   History reviewed. No pertinent family history.  Social History:  reports that he has quit smoking. His smoking use included cigarettes. He has never used smokeless tobacco. He reports current drug use. Drugs: Cocaine and Marijuana. He reports that he does not drink alcohol.  Allergies:  Allergies  Allergen Reactions   Tylenol  [Acetaminophen ] Other (See Comments)    Impacts liver    Medications:  Prior to Admission medications   Medication Sig Start Date End Date Taking? Authorizing Provider  ferrous sulfate 325 (65 FE) MG tablet Take 325 mg by mouth daily with breakfast.   Yes [provider]  furosemide  (LASIX ) 40 MG tablet Take 40 mg by mouth daily.   Yes [provider]   gabapentin  (NEURONTIN ) 800 MG tablet Take 800 mg by mouth 3 (three) times daily.   Yes [provider]  lactulose , encephalopathy, (CHRONULAC ) 10 GM/15ML SOLN Take 60 g by mouth 3 (three) times daily.   Yes [provider]  lamoTRIgine (LAMICTAL) 25 MG tablet Take 25 mg by mouth daily.   Yes [provider]  lidocaine  (LIDODERM ) 5 % Place 1 patch onto the skin daily. Remove & Discard patch within 12 hours or as directed by MD Places on right knee   Yes [provider]  Melatonin 10 MG TABS Take 1 tablet by mouth at bedtime.   Yes [provider]  oxycodone  (OXY-IR) 5 MG capsule Take 10 mg by mouth every 6 (six) hours as needed for pain.   Yes [provider]  pantoprazole  (PROTONIX ) 40 MG tablet Take 40 mg by mouth 2 (two) times daily.   Yes [provider]  spironolactone  (ALDACTONE ) 12.5 mg TABS tablet Take 12.5 mg by mouth in the morning.   Yes [provider]  chlorhexidine  (HIBICLENS ) 4 % external liquid Apply 15 mLs (1 Application total) topically as directed for 30 doses. Use as directed daily for 5 days every other week for 6 weeks. 06/29/23   Danton Lauraine LABOR, PA-C  diltiazem  (CARDIZEM  CD) 180 MG 24 hr capsule Take 1 capsule (180 mg total) by mouth daily. 05/04/23   Odell Celinda Balo, MD  docusate sodium  (COLACE) 100 MG capsule Take 1 capsule (100 mg total) by mouth 2 (two) times daily. 07/07/23   [place], Camellia PARAS, DO  methocarbamol  (ROBAXIN ) 750 MG tablet Take 1 tablet (750 mg total) by mouth every 8 (eight) hours as needed for muscle spasms. 07/07/23   Danton Lauraine LABOR, PA-C  metoprolol  succinate (TOPROL -XL) 50 MG 24 hr tablet Take 1 tablet (50 mg total) by mouth daily. Take with or immediately following a meal. 05/04/23   Odell Celinda Balo, MD  oxyCODONE  (ROXICODONE ) 15 MG immediate release tablet Take 1 tablet (15 mg total) by mouth every 4 (four) hours as needed. 07/07/23   Danton Lauraine LABOR, PA-C  oxyCODONE  (ROXICODONE )  15 MG immediate release tablet Take 1 tablet (15 mg total) by mouth every 4 (four) hours as needed for moderate pain (pain score 4-6) or severe pain (pain score 7-10). 07/07/23   [place], Eric J, DO   I have reviewed the patient's current medications. Prior to Admission:  No medications prior to admission.    Positive ROS: All other systems have been reviewed and were otherwise negative with the exception of those mentioned in the HPI and as above.  Exam: Weight 108 kg. General: Alert and oriented, no acute distress Cardiovascular: No pedal edema Respiratory: No cyanosis, no use of accessory musculature GI: No organomegaly, abdomen is soft and non-tender Skin: No lesions in the area of chief complaint Neurologic: Sensation intact distally Psychiatric: Patient is competent for consent with normal mood and affect Gait: Nonweightbearing right lower extremity:  Musculoskeletal: RLE: Wound to the lateral proximal tibia with some persistent wound dehiscence and drainage.  Tenderness with palpation throughout the proximal tibia.  Nontender throughout the calf, ankle, foot.  Ankle DF/PF intact.  Tolerates gentle knee motion although painful. + DP pulse  LLE: Skin without lesions. No tenderness to palpation. Full painless ROM, full strength in each muscle group without evidence of instability. Motor/sensory function at baseline. Neurovascularly intact.   Medical Decision Making: Data: Imaging: MRI R knee shows removal of all hardware.  Persistent ununited medial/lateral tibial plateau fractures with minimal interval healing.  Severe marrow edema throughout the proximal tibia with complex fluid collection, most concerning for osteomyelitis with intraosseous and extraosseous abscess as well as septic arthritis.  Labs: No results found for this or any previous visit (from the past 24 hours).  Medical history and chart was reviewed and case discussed with attending  provider.  Assessment/Plan: 58 year old male s/p ORIF R tibial plateau fracture 12/15/2022 with persistent infection and development of osteomyelitis  Unfortunately, patient is developed infection in the bone that has not been able to be resolved with oral or IV antibiotics.  At this point, I feel the on the patient would be proceeding with above-knee amputation.  Risks and benefits of the procedure have been discussed with the patient. Risks discussed included bleeding, continued infection, wound healing issues, damage to surrounding nerves and blood vessels, pain, and even anesthesia complications.  Patient states his understanding of the risk and agrees to proceed with surgery.  Will plan to admit the patient postoperatively for pain control and therapies.   Lauraine PATRIC Moores PA-C [facility] [phone] (office) [email or web address]

## 2023-11-29 NOTE — Progress Notes (Addendum)
 Surgical Instructions for Seyed Heffley   Your procedure is scheduled on Wednesday, 11/30/23. Report to [facility] Main Entrance A at 6 A.M., then check in with the Admitting office. Any questions or running late day of surgery: call [phone]  Questions prior to your surgery date: call [phone], Monday-Friday, 8am-4pm. If you experience any cold or flu symptoms such as cough, fever, chills, shortness of breath, etc. between now and your scheduled surgery, please notify us  at the above number.     Remember:  Do not eat or drink after midnight tonight.    Take these medicines the morning of surgery with A SIP OF WATER: diltiazem  (CARDIZEM  CD) docusate sodium  (COLACE) metoprolol  succinate (TOPROL -XL)  gabapentin  (NEURONTIN ) lamoTRIgine (LAMICTAL)  pantoprazole  (PROTONIX )    May take these medicines IF NEEDED: methocarbamol  (ROBAXIN )  oxyCODONE  (ROXICODONE )    One week prior to surgery, STOP taking any Aspirin  (unless otherwise instructed by your surgeon) Aleve, Naproxen, Ibuprofen, Motrin, Advil, Goody's, BC's, all herbal medications, fish oil, and non-prescription vitamins.                     Do NOT Smoke (Tobacco/Vaping) for 24 hours prior to your procedure.   You will be asked to remove any contacts, glasses, piercing's, hearing aid's, dentures/partials prior to surgery. Please bring cases for these items if needed.   SURGICAL WAITING ROOM VISITATION Patients may have no more than 2 support people in the waiting area - these visitors may rotate.   Pre-op nurse will coordinate an appropriate time for 1 ADULT support person, who may not rotate, to accompany patient in pre-op.  Children under the age of 68 must have an adult with them who is not the patient and must remain in the main waiting area with an adult.  If the patient needs to stay at the hospital during part of their recovery, the visitor guidelines for inpatient rooms apply.  Please refer to the  [facility] website for the visitor guidelines for any additional information.   If you received a COVID test during your pre-op visit  it is requested that you wear a mask when out in public, stay away from anyone that may not be feeling well and notify your surgeon if you develop symptoms. If you have been in contact with anyone that has tested positive in the last 10 days please notify you surgeon.

## 2023-11-29 NOTE — Anesthesia Preprocedure Evaluation (Signed)
 Anesthesia Evaluation  Patient identified by MRN, date of birth, ID band Patient awake    Reviewed: Allergy & Precautions, NPO status , Patient's Chart, lab work & pertinent test results, reviewed documented beta blocker date and time   History of Anesthesia Complications Negative for: history of anesthetic complications  Airway Mallampati: II  TM Distance: >3 FB Neck ROM: Full    Dental  (+) Poor Dentition, Missing, Loose   Pulmonary neg COPD, Current Smoker and Patient abstained from smoking., former smoker   breath sounds clear to auscultation       Cardiovascular hypertension, (-) CAD, (-) Past MI and (-) Cardiac Stents + Valvular Problems/Murmurs MR  Rhythm:Regular Rate:Normal  1. Left ventricular ejection fraction, by estimation, is 50 to 55%. The  left ventricle has low normal function.   2. Right ventricular systolic function is normal. The right ventricular  size is normal.   3. No left atrial/left atrial appendage thrombus was detected.   4. The mitral valve is normal in structure. Mild to moderate mitral valve  regurgitation. No evidence of mitral stenosis.   5. Tricuspid valve regurgitation is mild to moderate.   6. The aortic valve is tricuspid. There is mild calcification of the  aortic valve. Aortic valve regurgitation is trivial. Aortic valve  sclerosis is present, with no evidence of aortic valve stenosis.   7. Agitated saline contrast bubble study was negative, with no evidence  of any interatrial shunt.   8. 3D performed of the mitral valve and demonstrates No evidence of  vegetation on mitral valve.     Neuro/Psych neg Seizures PSYCHIATRIC DISORDERS         GI/Hepatic ,GERD  ,,(+) Cirrhosis   ascites  substance abuse  alcohol use, cocaine use, marijuana use and methamphetamine use, Hepatitis -S/P TIPS INR 1.0 most recently   Endo/Other    Renal/GU      Musculoskeletal  (+) Arthritis ,     Abdominal   Peds  Hematology  (+) Blood dyscrasia, anemia   Anesthesia Other Findings   Reproductive/Obstetrics                              Anesthesia Physical Anesthesia Plan  ASA: 3  Anesthesia Plan: General   Post-op Pain Management:    Induction: Intravenous  PONV Risk Score and Plan: 2 and Ondansetron  and Dexamethasone   Airway Management Planned: Oral ETT  Additional Equipment:   Intra-op Plan:   Post-operative Plan: Extubation in OR  Informed Consent: I have reviewed the patients History and Physical, chart, labs and discussed the procedure including the risks, benefits and alternatives for the proposed anesthesia with the patient or authorized representative who has indicated his/her understanding and acceptance.     Dental advisory given  Plan Discussed with: CRNA  Anesthesia Plan Comments: (PAT note written 11/29/2023 by Criag Wicklund, PA-C.  )         Anesthesia Quick Evaluation

## 2023-11-29 NOTE — Progress Notes (Signed)
 Anesthesia Chart Review: Jonathan Ibarra  Case: [id number] Date/Time: 11/30/23 0815   Procedure: AMPUTATION, ABOVE KNEE (Right: Knee)   Anesthesia type: General   Pre-op diagnosis: Chronic osteomyelitis   [facility]: [facility] ROOM 03 / [facility]   Surgeons: Kendal Franky SQUIBB, MD       DISCUSSION: Patient is a 58 year old male scheduled for the above procedure.  History includes former smoker, polysubstance use (cocaine, marijuana, meth, alcohol), hepatitis C (s/p treatment), cirrhosis (multiple paracenteses 2016->s/p TIPS 10/18/2014), SVT (09/2022 insetting of Strep pyogenes bacteremia with right thigh cellulitis), PAF (diagnosed 11/2022 in setting of polytrauma/right tibial plateau fracture), HTN, GERD, anemia, right bicondylar tibial plateau fracture (s/p ORIF 12/13/2022 with postoperative septic knee, s/p I&D 12/29/2022, 01/12/2023; I&D with removal of right tibia hardware 04/20/2023; I&D, excision of intraosseous right proximal tibia abscess, placement of antibiotic beads 06/29/2023), right pleural effusion (s/p thoracentesis 08/05/2023).    Admitted 07/29/2023-08/09/2023 [facility] admission for ABL anemia due to duodenal ulcer and gastritis. Required 2 units PRBC for HGB 6.6. No apparent varices on ECG. Noted to have right pleural effusion with initial concern for malfunctioning TIPS, but TIPS patent on US . S/p right thoracentesis 08/05/2023. He continued with wound care for MRSA right leg wound. Metoprolol  increased to improve afib rate control. Not on anticoagulation therapy due to concern for bleeding risk. Also admissions in August and early September for right knee infection (ESBL Klebsiella, MRSA). Also evaluated in the ED on 11/01/2023 after signing himself out of [facility] nursing facility and then reportedly went and did a bunch of unknown drugs and then signed himself back into [facility]. He had to be given ketamine  IM after he became aggressive and combative towards staff. UDS was positive for Cannabinoids  and oxycodone .   He has primarily had his cardiology evaluations while in-patient for SVT and PAF that were both diagnosed in 2024. He had been evaluated by Alveta Mungo, MD and EP [facility], Will, MD. He also had a TEE by Lonni Slain, MD on 04/21/2023 to rule out endocarditis. Last TTE was on 11/07/2023 ([facility]) and showed mild concentric LVH, hyperdynamic LVEF > 70%, normal LV wall motion, normal RV size and function, moderately dilated LA, AV calcification with no significant stenosis or regurgitation.   Per last GI evaluation by Dr. Tawni, His liver function appears stable with a MELD score of 6. His hepatitis C has been cleared, confirmed by a negative RNA on 11/05/2023. He is advised to continue abstaining from alcohol and to be cautious with oxycodone , gabapentin , and tramadol  due to potential liver impact....  No encephalopathy on lactulose . No ascites s/p TIPS. US  planned in 3 months. Not a candidate for liver transplantation at this time due to comorbid infections and low MELD.   He has a same-day workup.  Anesthesia team to evaluate on the day of surgery.   VS: Wt 108 kg   BMI 31.40 kg/m  Wt Readings from Last 3 Encounters:  06/26/23 115.7 kg  04/16/23 118.2 kg  01/12/23 117.9 kg   BP Readings from Last 3 Encounters:  07/07/23 104/72  05/03/23 122/78  02/04/23 (!) 131/96   Pulse Readings from Last 3 Encounters:  07/07/23 (!) 59  05/03/23 85  02/04/23 81    PROVIDERS: Lyle Knee, MD is PCP  Joesph Sharper, MD is ID [id number]) Cherylle Hicks, MD is GI   LABS: Most recent lab results noted are from [facility] (see [facility]).  11/08/2023 results include sodium 135, potassium 4.7, glucose 87, BUN  14, creatinine 0.61, BUN 3.2, total protein 6.6, total bilirubin 0.8, alkaline phosphatase 100, AST 16, ALT 11, calcium  8.9, magnesium  1.9, PT 11.2, INR 1.0, WBC 5.6, hemoglobin 11.6, hematocrit 34.1, platelet count 180K. HIV screen nonreactive and HCV RVA not detected on  11/05/2023.   IMAGES: 1V CXR 11/03/2023 ([facility]) FINDINGS:  Supportive devices: None.  Cardiovascular/lungs/pleura: Low lung volume. Cardiomegaly has significantly progressed from prior exam. . Diffuse prominent pulmonary vasculatures noted. No focal consolidation. No large effusion or pneumothorax.  Other: No interval osseous or soft tissue changes. IMPRESSION:  Cardiomegaly and mild pulmonary interstitial edema.  MRI Right Knee 10/29/2023: IMPRESSION: 1. Prior removal of tibial screw and plate. Persistent ununited medial and lateral tibial plateau fractures without interval healing and 9 mm of depression of the lateral tibial plateau. Severe marrow edema throughout the proximal tibia with a complex intraosseous fluid collection measuring 2.8 x 6.7 x 3.2 cm with extraosseous extension through the anterolateral proximal tibial metaphysis measuring 2 x 4.4 x 2 cm. Overall findings are most concerning for osteomyelitis with intraosseous and extraosseous abscess as well as septic arthritis. 2. Nondisplaced, nondepressed subchondral fracture of the posterior weight-bearing medial femoral condyle with severe surrounding marrow edema. Underlying infection is not excluded. 3. Partial radial tear of the body and posterior horn of the medial meniscus. 4. Maceration of the body and anterior horn of the lateral meniscus. 5. Tricompartmental cartilage abnormalities as described above.    EKG: EKG 11/01/2023 ([facility]): Per Narrative in Care Everywhere: Diagnosis Normal sinus rhythm  Left anterior fascicular block  Moderate voltage criteria for LVH, may be normal variant  Abnormal ECG  When compared with ECG of 27-Jul-2023 00:12,  Sinus rhythm has replaced Atrial fibrillation  Vent. rate has decreased BY  32 BPM  ST no longer depressed in Anterior leads  Nonspecific T wave abnormality no longer evident in Lateral leads  QT has shortened  Jonathan Ibarra ([id number]) on 11/01/2023 8:07:14 PM  certifies that he/she has reviewed the ECG tracing and confirms the independent  interpretation is  correct.   EKG 06/27/2023: Atrial fibrillation with rapid ventricular response at 158 bpm Left anterior fascicular block Abnormal R-wave progression, late transition Borderline T wave abnormalities Borderline prolonged QT interval When compared with ECG of 04/17/2023, HEART RATE has increased Confirmed by Raford Lenis ([id number]) on 06/27/2023 1:58:07 AM   CV: TTE 11/07/2023 ([facility]): SUMMARY  The left ventricular size is normal with mild concentric hypertrophy.  The left ventricle is hyperdynamic.  LV ejection fraction = >70%.  The left ventricular wall motion is normal.  Left ventricular filling pattern is normal.  The right ventricle is normal in size and function.  The left atrium is mildly to moderately dilated.  There is aortic valve sclerosis; Focal calcification of the aortic valve.  There is no significant valvular stenosis or regurgitation.  IVC size was normal.  There is no pericardial effusion.  Difficult to compare to the prior study due to different image quality.    TEE 04/21/2023 (Re: Bacteremia): IMPRESSIONS   1. Left ventricular ejection fraction, by estimation, is 50 to 55%. The  left ventricle has low normal function.   2. Right ventricular systolic function is normal. The right ventricular  size is normal.   3. No left atrial/left atrial appendage thrombus was detected.   4. The mitral valve is normal in structure. Mild to moderate mitral valve  regurgitation. No evidence of mitral stenosis.   5. Tricuspid valve regurgitation is mild to moderate.  6. The aortic valve is tricuspid. There is mild calcification of the  aortic valve. Aortic valve regurgitation is trivial. Aortic valve  sclerosis is present, with no evidence of aortic valve stenosis.   7. Agitated saline contrast bubble study was negative, with no evidence  of any interatrial shunt.   8. 3D  performed of the mitral valve and demonstrates No evidence of  vegetation on mitral valve.  - Conclusion(s)/Recommendation(s): No evidence of vegetation/infective  endocarditis on this transesophageael echocardiogram.    Past Medical History:  Diagnosis Date   Alcohol abuse    Anemia    transfusion 08/04/23 in [facility]   Cirrhosis of liver (HCC)    Constipation    GERD (gastroesophageal reflux disease)    H/O ETOH abuse    Hepatitis    Hep C w/o coma, chronic - treated   Hypertension    Nicotine  dependence    PAF (paroxysmal atrial fibrillation) (HCC) 12/28/2022   Pyogenic arthritis (HCC)    leg right   Substance abuse (HCC) 11/01/2023   Polysubstance abuse - admitted in [facility]   SVT (supraventricular tachycardia) 09/2022    Past Surgical History:  Procedure Laterality Date   CHOLECYSTECTOMY     COLONOSCOPY  2017   COLONOSCOPY WITH ESOPHAGOGASTRODUODENOSCOPY (EGD)  08/03/2023   in [facility]   HARDWARE REMOVAL Right 04/20/2023   Procedure: HARDWARE REMOVAL Knee;  Surgeon: Kendal Franky SQUIBB, MD;  Location: [facility] OR;  Service: Orthopedics;  Laterality: Right;   IR TIPS     IRRIGATION AND DEBRIDEMENT KNEE Right 12/29/2022   Procedure: IRRIGATION AND DEBRIDEMENT KNEE;  Surgeon: Kendal Franky SQUIBB, MD;  Location: [facility];  Service: Orthopedics;  Laterality: Right;   IRRIGATION AND DEBRIDEMENT KNEE Right 01/12/2023   Procedure: IRRIGATION AND DEBRIDEMENT KNEE;  Surgeon: Kendal Franky SQUIBB, MD;  Location: [facility];  Service: Orthopedics;  Laterality: Right;   IRRIGATION AND DEBRIDEMENT KNEE Right 06/29/2023   Procedure: IRRIGATION AND DEBRIDEMENT KNEE;  Surgeon: Kendal Franky SQUIBB, MD;  Location[facility];  Service: Orthopedics;  Laterality: Right;   ORIF TIBIA PLATEAU Right 12/13/2022   Procedure: OPEN REDUCTION INTERNAL FIXATION (ORIF) TIBIAL PLATEAU;  Surgeon: Kendal Franky SQUIBB, MD;  Location: [facility];  Service: Orthopedics;  Laterality: Right;   TRANSESOPHAGEAL ECHOCARDIOGRAM (CATH LAB) N/A 04/21/2023   Procedure:  TRANSESOPHAGEAL ECHOCARDIOGRAM;  Surgeon: Lonni Slain, MD;  Location: [facility] INVASIVE CV LAB;  Service: Cardiovascular;  Laterality: N/A;   UPPER GI ENDOSCOPY  07/2023    MEDICATIONS: No current facility-administered medications for this encounter.    ferrous sulfate 325 (65 FE) MG tablet   furosemide  (LASIX ) 40 MG tablet   gabapentin  (NEURONTIN ) 800 MG tablet   lactulose , encephalopathy, (CHRONULAC ) 10 GM/15ML SOLN   lamoTRIgine (LAMICTAL) 25 MG tablet   lidocaine  (LIDODERM ) 5 %   Melatonin 10 MG TABS   oxycodone  (OXY-IR) 5 MG capsule   pantoprazole  (PROTONIX ) 40 MG tablet   spironolactone  (ALDACTONE ) 12.5 mg TABS tablet   chlorhexidine  (HIBICLENS ) 4 % external liquid   diltiazem  (CARDIZEM  CD) 180 MG 24 hr capsule   docusate sodium  (COLACE) 100 MG capsule   methocarbamol  (ROBAXIN ) 750 MG tablet   metoprolol  succinate (TOPROL -XL) 50 MG 24 hr tablet   oxyCODONE  (ROXICODONE ) 15 MG immediate release tablet   oxyCODONE  (ROXICODONE ) 15 MG immediate release tablet    Jonathan Ruder, PA-C Surgical Short Stay/Anesthesiology [facility] Phone [phone] [facility] Phone [phone] 11/29/2023 1:18 PM

## 2023-11-29 NOTE — Progress Notes (Addendum)
 PCP - Dr Rosina SAILOR. [facility] Cardiologist - none  Chest x-ray - 06/27/23; 11/03/23 [facility] EKG - 06/27/23; 11/01/23 [facility] Stress Test - n/a ECHO TEE - 04/21/23; 11/07/23 [facility] Cardiac Cath - n/a  ICD Pacemaker/Loop - n/a  Sleep Study -  n/a  Diabetes - n/a  Aspirin  & Blood Thinner Instructions:  n/a  NPO  Anesthesia review: Yes  STOP now taking any Aspirin  (unless otherwise instructed by your surgeon), Aleve, Naproxen, Ibuprofen, Motrin, Advil, Goody's, BC's, all herbal medications, fish oil, and all vitamins.   Coronavirus Screening Does the patient have any of the following symptoms:  Cough yes/no: No Fever (>100.14F)  yes/no: No Runny nose yes/no: No Sore throat yes/no: No Difficulty breathing/shortness of breath  yes/no: No  Has the patient traveled in the last 14 days and where? yes/no: No  Spoke with Nurse Seena Molt at [facility] ofr PAT information.  Instructions for DOS was fax to [phone] and [phone].

## 2023-11-30 ENCOUNTER — Inpatient Hospital Stay (HOSPITAL_COMMUNITY): Payer: Self-pay | Admitting: Vascular Surgery

## 2023-11-30 ENCOUNTER — Other Ambulatory Visit: Payer: Self-pay

## 2023-11-30 ENCOUNTER — Encounter (HOSPITAL_COMMUNITY)
Admission: RE | Disposition: A | Discharge status: Skilled Nursing Facility | Payer: Self-pay | Source: Skilled Nursing Facility | Attending: Student

## 2023-11-30 ENCOUNTER — Inpatient Hospital Stay (HOSPITAL_COMMUNITY)
Admission: RE | Admit: 2023-11-30 | Discharge: 2023-12-06 | DRG: 857 | Disposition: A | Discharge status: Skilled Nursing Facility | Source: Skilled Nursing Facility | Attending: Student | Admitting: Student

## 2023-11-30 ENCOUNTER — Encounter (HOSPITAL_COMMUNITY): Payer: Self-pay | Admitting: Student

## 2023-11-30 ENCOUNTER — Inpatient Hospital Stay (HOSPITAL_COMMUNITY)

## 2023-11-30 DIAGNOSIS — E66812 Obesity, class 2: Secondary | ICD-10-CM | POA: Diagnosis present

## 2023-11-30 DIAGNOSIS — I358 Other nonrheumatic aortic valve disorders: Secondary | ICD-10-CM | POA: Diagnosis present

## 2023-11-30 DIAGNOSIS — R296 Repeated falls: Secondary | ICD-10-CM | POA: Diagnosis present

## 2023-11-30 DIAGNOSIS — G8929 Other chronic pain: Secondary | ICD-10-CM | POA: Diagnosis present

## 2023-11-30 DIAGNOSIS — M86661 Other chronic osteomyelitis, right tibia and fibula: Secondary | ICD-10-CM | POA: Diagnosis present

## 2023-11-30 DIAGNOSIS — F1410 Cocaine abuse, uncomplicated: Secondary | ICD-10-CM | POA: Diagnosis present

## 2023-11-30 DIAGNOSIS — K7030 Alcoholic cirrhosis of liver without ascites: Secondary | ICD-10-CM | POA: Diagnosis present

## 2023-11-30 DIAGNOSIS — I10 Essential (primary) hypertension: Secondary | ICD-10-CM | POA: Diagnosis present

## 2023-11-30 DIAGNOSIS — S82141S Displaced bicondylar fracture of right tibia, sequela: Secondary | ICD-10-CM

## 2023-11-30 DIAGNOSIS — Z886 Allergy status to analgesic agent status: Secondary | ICD-10-CM

## 2023-11-30 DIAGNOSIS — Z87891 Personal history of nicotine dependence: Secondary | ICD-10-CM

## 2023-11-30 DIAGNOSIS — K7031 Alcoholic cirrhosis of liver with ascites: Secondary | ICD-10-CM | POA: Diagnosis present

## 2023-11-30 DIAGNOSIS — F1010 Alcohol abuse, uncomplicated: Secondary | ICD-10-CM | POA: Diagnosis present

## 2023-11-30 DIAGNOSIS — F1210 Cannabis abuse, uncomplicated: Secondary | ICD-10-CM | POA: Diagnosis present

## 2023-11-30 DIAGNOSIS — J90 Pleural effusion, not elsewhere classified: Secondary | ICD-10-CM | POA: Diagnosis not present

## 2023-11-30 DIAGNOSIS — T8140XA Infection following a procedure, unspecified, initial encounter: Principal | ICD-10-CM | POA: Diagnosis present

## 2023-11-30 DIAGNOSIS — M009 Pyogenic arthritis, unspecified: Secondary | ICD-10-CM | POA: Diagnosis present

## 2023-11-30 DIAGNOSIS — Z9181 History of falling: Secondary | ICD-10-CM

## 2023-11-30 DIAGNOSIS — K219 Gastro-esophageal reflux disease without esophagitis: Secondary | ICD-10-CM | POA: Diagnosis present

## 2023-11-30 DIAGNOSIS — F32A Depression, unspecified: Secondary | ICD-10-CM | POA: Diagnosis present

## 2023-11-30 DIAGNOSIS — I480 Paroxysmal atrial fibrillation: Secondary | ICD-10-CM | POA: Diagnosis present

## 2023-11-30 DIAGNOSIS — Z6835 Body mass index (BMI) 35.0-35.9, adult: Secondary | ICD-10-CM

## 2023-11-30 DIAGNOSIS — D62 Acute posthemorrhagic anemia: Secondary | ICD-10-CM | POA: Diagnosis not present

## 2023-11-30 DIAGNOSIS — I471 Supraventricular tachycardia, unspecified: Secondary | ICD-10-CM | POA: Diagnosis present

## 2023-11-30 DIAGNOSIS — B182 Chronic viral hepatitis C: Secondary | ICD-10-CM | POA: Diagnosis present

## 2023-11-30 DIAGNOSIS — F1510 Other stimulant abuse, uncomplicated: Secondary | ICD-10-CM | POA: Diagnosis present

## 2023-11-30 DIAGNOSIS — Z8711 Personal history of peptic ulcer disease: Secondary | ICD-10-CM

## 2023-11-30 DIAGNOSIS — K5900 Constipation, unspecified: Secondary | ICD-10-CM | POA: Diagnosis present

## 2023-11-30 DIAGNOSIS — G40909 Epilepsy, unspecified, not intractable, without status epilepticus: Secondary | ICD-10-CM | POA: Diagnosis present

## 2023-11-30 DIAGNOSIS — Z79899 Other long term (current) drug therapy: Secondary | ICD-10-CM

## 2023-11-30 DIAGNOSIS — Z91199 Patient's noncompliance with other medical treatment and regimen due to unspecified reason: Secondary | ICD-10-CM

## 2023-11-30 HISTORY — DX: Pyogenic arthritis, unspecified: M00.9

## 2023-11-30 HISTORY — DX: Constipation, unspecified: K59.00

## 2023-11-30 HISTORY — DX: Anemia, unspecified: D64.9

## 2023-11-30 HISTORY — DX: Alcohol abuse, in remission: F10.11

## 2023-11-30 HISTORY — PX: AMPUTATION: SHX166

## 2023-11-30 HISTORY — DX: Nicotine dependence, unspecified, uncomplicated: F17.200

## 2023-11-30 HISTORY — DX: Inflammatory liver disease, unspecified: K75.9

## 2023-11-30 HISTORY — DX: Gastro-esophageal reflux disease without esophagitis: K21.9

## 2023-11-30 LAB — SURGICAL PCR SCREEN
MRSA, PCR: NEGATIVE
Staphylococcus aureus: NEGATIVE

## 2023-11-30 SURGERY — AMPUTATION, ABOVE KNEE
Anesthesia: General | Site: Knee | Laterality: Right

## 2023-11-30 MED ORDER — OXYCODONE HCL 5 MG PO TABS: 5.0000 mg | ORAL_TABLET | Freq: Once | ORAL | Status: DC | PRN

## 2023-11-30 MED ORDER — PROPOFOL 10 MG/ML IV BOLUS
INTRAVENOUS | Status: DC | PRN
  Administered 2023-11-30: 150 mg via INTRAVENOUS

## 2023-11-30 MED ORDER — LACTULOSE 10 GM/15ML PO SOLN
60.0000 g | Freq: Three times a day (TID) | ORAL | Status: DC
  Administered 2023-12-01 – 2023-12-06 (×8): 60 g via ORAL
  Filled 2023-11-30 (×20): qty 90

## 2023-11-30 MED ORDER — DOCUSATE SODIUM 100 MG PO CAPS
100.0000 mg | ORAL_CAPSULE | Freq: Two times a day (BID) | ORAL | Status: DC
Start: 2023-11-30 — End: 2023-12-06
  Administered 2023-11-30 – 2023-12-06 (×11): 100 mg via ORAL
  Filled 2023-11-30 (×12): qty 1

## 2023-11-30 MED ORDER — VITAMIN C 500 MG PO TABS
1000.0000 mg | ORAL_TABLET | Freq: Every day | ORAL | Status: DC
  Administered 2023-11-30 – 2023-12-06 (×7): 1000 mg via ORAL
  Filled 2023-11-30 (×7): qty 2

## 2023-11-30 MED ORDER — HYDROMORPHONE HCL 1 MG/ML IJ SOLN
INTRAMUSCULAR | Status: AC
  Filled 2023-11-30: qty 1

## 2023-11-30 MED ORDER — FERROUS SULFATE 325 (65 FE) MG PO TABS
325.0000 mg | ORAL_TABLET | Freq: Every day | ORAL | Status: DC
  Administered 2023-12-01 – 2023-12-06 (×6): 325 mg via ORAL
  Filled 2023-11-30 (×6): qty 1

## 2023-11-30 MED ORDER — VANCOMYCIN HCL 1000 MG IV SOLR
INTRAVENOUS | Status: DC | PRN
  Administered 2023-11-30: 1000 mg

## 2023-11-30 MED ORDER — DEXAMETHASONE SODIUM PHOSPHATE 10 MG/ML IJ SOLN
INTRAMUSCULAR | Status: DC | PRN
  Administered 2023-11-30: 10 mg via INTRAVENOUS

## 2023-11-30 MED ORDER — FENTANYL CITRATE (PF) 100 MCG/2ML IJ SOLN
INTRAMUSCULAR | Status: AC
  Filled 2023-11-30: qty 2

## 2023-11-30 MED ORDER — PROPOFOL 10 MG/ML IV BOLUS
INTRAVENOUS | Status: AC
  Filled 2023-11-30: qty 20

## 2023-11-30 MED ORDER — LAMOTRIGINE 25 MG PO TABS
25.0000 mg | ORAL_TABLET | Freq: Every day | ORAL | Status: DC
  Administered 2023-11-30 – 2023-12-06 (×7): 25 mg via ORAL
  Filled 2023-11-30 (×7): qty 1

## 2023-11-30 MED ORDER — METOCLOPRAMIDE HCL 5 MG PO TABS: 5.0000 mg | ORAL_TABLET | Freq: Three times a day (TID) | ORAL | Status: DC | PRN

## 2023-11-30 MED ORDER — VANCOMYCIN HCL IN DEXTROSE 1-5 GM/200ML-% IV SOLN
1000.0000 mg | INTRAVENOUS | Status: AC
Start: 2023-11-30 — End: 2023-11-30
  Administered 2023-11-30: 1000 mg via INTRAVENOUS
  Filled 2023-11-30: qty 200

## 2023-11-30 MED ORDER — SUGAMMADEX SODIUM 200 MG/2ML IV SOLN
INTRAVENOUS | Status: DC | PRN
  Administered 2023-11-30: 250 mg via INTRAVENOUS

## 2023-11-30 MED ORDER — CEFAZOLIN SODIUM-DEXTROSE 2-4 GM/100ML-% IV SOLN
2.0000 g | Freq: Three times a day (TID) | INTRAVENOUS | Status: AC
  Administered 2023-11-30 – 2023-12-01 (×3): 2 g via INTRAVENOUS
  Filled 2023-11-30 (×3): qty 100

## 2023-11-30 MED ORDER — FENTANYL CITRATE (PF) 250 MCG/5ML IJ SOLN
INTRAMUSCULAR | Status: DC | PRN
  Administered 2023-11-30 (×2): 50 ug via INTRAVENOUS
  Administered 2023-11-30: 150 ug via INTRAVENOUS

## 2023-11-30 MED ORDER — ASPIRIN 325 MG PO TABS
325.0000 mg | ORAL_TABLET | Freq: Every day | ORAL | Status: DC
  Administered 2023-12-02 – 2023-12-06 (×5): 325 mg via ORAL
  Filled 2023-11-30 (×6): qty 1

## 2023-11-30 MED ORDER — KETOROLAC TROMETHAMINE 15 MG/ML IJ SOLN
30.0000 mg | Freq: Four times a day (QID) | INTRAMUSCULAR | Status: AC
  Administered 2023-11-30 – 2023-12-05 (×20): 30 mg via INTRAVENOUS
  Filled 2023-11-30 (×20): qty 2

## 2023-11-30 MED ORDER — BUPIVACAINE HCL (PF) 0.25 % IJ SOLN
INTRAMUSCULAR | Status: AC
  Filled 2023-11-30: qty 30

## 2023-11-30 MED ORDER — CEFAZOLIN SODIUM-DEXTROSE 3-4 GM/150ML-% IV SOLN
3.0000 g | INTRAVENOUS | Status: AC
Start: 2023-11-30 — End: 2023-11-30
  Administered 2023-11-30: 3 g via INTRAVENOUS
  Filled 2023-11-30: qty 150

## 2023-11-30 MED ORDER — GABAPENTIN 400 MG PO CAPS
800.0000 mg | ORAL_CAPSULE | Freq: Three times a day (TID) | ORAL | Status: DC
  Administered 2023-11-30 – 2023-12-06 (×18): 800 mg via ORAL
  Filled 2023-11-30 (×18): qty 2

## 2023-11-30 MED ORDER — ROCURONIUM BROMIDE 10 MG/ML (PF) SYRINGE
PREFILLED_SYRINGE | INTRAVENOUS | Status: DC | PRN
  Administered 2023-11-30: 70 mg via INTRAVENOUS

## 2023-11-30 MED ORDER — ONDANSETRON HCL 4 MG/2ML IJ SOLN: 4.0000 mg | Freq: Four times a day (QID) | INTRAMUSCULAR | Status: DC | PRN

## 2023-11-30 MED ORDER — MIDAZOLAM HCL 2 MG/2ML IJ SOLN
INTRAMUSCULAR | Status: AC
  Filled 2023-11-30: qty 2

## 2023-11-30 MED ORDER — TOBRAMYCIN SULFATE 1.2 G IJ SOLR
INTRAMUSCULAR | Status: DC | PRN
  Administered 2023-11-30: 1.2 g

## 2023-11-30 MED ORDER — BISACODYL 5 MG PO TBEC: 5.0000 mg | DELAYED_RELEASE_TABLET | Freq: Every day | ORAL | Status: DC | PRN

## 2023-11-30 MED ORDER — SODIUM CHLORIDE 0.9 % IV SOLN: INTRAVENOUS | Status: DC

## 2023-11-30 MED ORDER — METOPROLOL SUCCINATE ER 50 MG PO TB24
50.0000 mg | ORAL_TABLET | Freq: Every day | ORAL | Status: DC
  Administered 2023-12-01 – 2023-12-06 (×6): 50 mg via ORAL
  Filled 2023-11-30 (×6): qty 1

## 2023-11-30 MED ORDER — LIDOCAINE HCL (CARDIAC) PF 100 MG/5ML IV SOSY
PREFILLED_SYRINGE | INTRAVENOUS | Status: DC | PRN
  Administered 2023-11-30: 100 mg via INTRATRACHEAL

## 2023-11-30 MED ORDER — ORAL CARE MOUTH RINSE: 15.0000 mL | Freq: Once | OROMUCOSAL | Status: AC

## 2023-11-30 MED ORDER — HYDROMORPHONE HCL 1 MG/ML IJ SOLN
1.0000 mg | INTRAMUSCULAR | Status: DC | PRN
  Administered 2023-11-30 – 2023-12-05 (×10): 1 mg via INTRAVENOUS
  Filled 2023-11-30 (×11): qty 1

## 2023-11-30 MED ORDER — LACTATED RINGERS IV SOLN: INTRAVENOUS | Status: DC

## 2023-11-30 MED ORDER — POLYETHYLENE GLYCOL 3350 17 G PO PACK: 17.0000 g | PACK | Freq: Every day | ORAL | Status: DC | PRN

## 2023-11-30 MED ORDER — DIAZEPAM 5 MG PO TABS
5.0000 mg | ORAL_TABLET | Freq: Three times a day (TID) | ORAL | Status: DC | PRN
Start: 2023-11-30 — End: 2023-12-06
  Administered 2023-12-04: 5 mg via ORAL
  Filled 2023-11-30: qty 1

## 2023-11-30 MED ORDER — DULOXETINE HCL 30 MG PO CPEP
30.0000 mg | ORAL_CAPSULE | Freq: Every day | ORAL | Status: DC
  Administered 2023-11-30 – 2023-12-06 (×7): 30 mg via ORAL
  Filled 2023-11-30 (×7): qty 1

## 2023-11-30 MED ORDER — MELATONIN 5 MG PO TABS
5.0000 mg | ORAL_TABLET | Freq: Every day | ORAL | Status: DC
  Administered 2023-11-30 – 2023-12-05 (×6): 5 mg via ORAL
  Filled 2023-11-30 (×6): qty 1

## 2023-11-30 MED ORDER — VANCOMYCIN HCL 1000 MG IV SOLR
INTRAVENOUS | Status: AC
Start: 2023-11-30 — End: 2023-11-30
  Filled 2023-11-30: qty 20

## 2023-11-30 MED ORDER — ACETAMINOPHEN 10 MG/ML IV SOLN: 1000.0000 mg | Freq: Once | INTRAVENOUS | Status: DC | PRN

## 2023-11-30 MED ORDER — ONDANSETRON HCL 4 MG/2ML IJ SOLN
INTRAMUSCULAR | Status: DC | PRN
  Administered 2023-11-30: 4 mg via INTRAVENOUS

## 2023-11-30 MED ORDER — SPIRONOLACTONE 12.5 MG HALF TABLET
12.5000 mg | ORAL_TABLET | Freq: Every morning | ORAL | Status: DC
  Administered 2023-12-01 – 2023-12-06 (×6): 12.5 mg via ORAL
  Filled 2023-11-30 (×6): qty 1

## 2023-11-30 MED ORDER — METOPROLOL SUCCINATE ER 25 MG PO TB24
ORAL_TABLET | ORAL | Status: AC
  Administered 2023-11-30: 50 mg via ORAL
  Filled 2023-11-30: qty 2

## 2023-11-30 MED ORDER — METOPROLOL SUCCINATE ER 25 MG PO TB24: 50.0000 mg | ORAL_TABLET | Freq: Once | ORAL | Status: AC

## 2023-11-30 MED ORDER — OXYCODONE HCL 5 MG PO TABS
15.0000 mg | ORAL_TABLET | ORAL | Status: DC | PRN
Start: 2023-11-30 — End: 2023-12-06
  Administered 2023-11-30 – 2023-12-01 (×2): 20 mg via ORAL
  Administered 2023-12-02 – 2023-12-03 (×4): 15 mg via ORAL
  Administered 2023-12-04: 20 mg via ORAL
  Administered 2023-12-04: 15 mg via ORAL
  Administered 2023-12-04 – 2023-12-06 (×5): 20 mg via ORAL
  Filled 2023-11-30: qty 3
  Filled 2023-11-30: qty 4
  Filled 2023-11-30: qty 3
  Filled 2023-11-30 (×6): qty 4
  Filled 2023-11-30 (×4): qty 3
  Filled 2023-11-30: qty 4

## 2023-11-30 MED ORDER — PHENYLEPHRINE 80 MCG/ML (10ML) SYRINGE FOR IV PUSH (FOR BLOOD PRESSURE SUPPORT)
PREFILLED_SYRINGE | INTRAVENOUS | Status: DC | PRN
Start: 2023-11-30 — End: 2023-11-30
  Administered 2023-11-30 (×2): 80 ug via INTRAVENOUS

## 2023-11-30 MED ORDER — HYDRALAZINE HCL 10 MG PO TABS
10.0000 mg | ORAL_TABLET | Freq: Four times a day (QID) | ORAL | Status: DC | PRN
Start: 2023-11-30 — End: 2023-12-06

## 2023-11-30 MED ORDER — ONDANSETRON HCL 4 MG/2ML IJ SOLN: 4.0000 mg | Freq: Once | INTRAMUSCULAR | Status: DC | PRN

## 2023-11-30 MED ORDER — BUPIVACAINE HCL (PF) 0.25 % IJ SOLN
INTRAMUSCULAR | Status: DC | PRN
  Administered 2023-11-30: 10 mL

## 2023-11-30 MED ORDER — KETAMINE HCL 50 MG/5ML IJ SOSY
PREFILLED_SYRINGE | INTRAMUSCULAR | Status: DC | PRN
  Administered 2023-11-30: 50 mg via INTRAVENOUS

## 2023-11-30 MED ORDER — ONDANSETRON HCL 4 MG PO TABS: 4.0000 mg | ORAL_TABLET | Freq: Four times a day (QID) | ORAL | Status: DC | PRN

## 2023-11-30 MED ORDER — PANTOPRAZOLE SODIUM 40 MG PO TBEC
40.0000 mg | DELAYED_RELEASE_TABLET | Freq: Two times a day (BID) | ORAL | Status: DC
  Administered 2023-11-30 – 2023-12-06 (×12): 40 mg via ORAL
  Filled 2023-11-30 (×12): qty 1

## 2023-11-30 MED ORDER — METOCLOPRAMIDE HCL 5 MG/ML IJ SOLN: 5.0000 mg | Freq: Three times a day (TID) | INTRAMUSCULAR | Status: DC | PRN

## 2023-11-30 MED ORDER — MAGNESIUM CITRATE PO SOLN: 1.0000 | Freq: Once | ORAL | Status: DC | PRN

## 2023-11-30 MED ORDER — KETAMINE HCL 50 MG/5ML IJ SOSY
PREFILLED_SYRINGE | INTRAMUSCULAR | Status: AC
  Filled 2023-11-30: qty 5

## 2023-11-30 MED ORDER — FENTANYL CITRATE (PF) 250 MCG/5ML IJ SOLN
INTRAMUSCULAR | Status: AC
  Filled 2023-11-30: qty 5

## 2023-11-30 MED ORDER — METHOCARBAMOL 500 MG PO TABS
1000.0000 mg | ORAL_TABLET | Freq: Three times a day (TID) | ORAL | Status: DC
  Administered 2023-11-30 – 2023-12-06 (×18): 1000 mg via ORAL
  Filled 2023-11-30 (×19): qty 2

## 2023-11-30 MED ORDER — FUROSEMIDE 40 MG PO TABS
40.0000 mg | ORAL_TABLET | Freq: Every day | ORAL | Status: DC
  Administered 2023-11-30 – 2023-12-06 (×7): 40 mg via ORAL
  Filled 2023-11-30 (×7): qty 1

## 2023-11-30 MED ORDER — CHLORHEXIDINE GLUCONATE 0.12 % MT SOLN
15.0000 mL | Freq: Once | OROMUCOSAL | Status: AC
  Administered 2023-11-30: 15 mL via OROMUCOSAL
  Filled 2023-11-30: qty 15

## 2023-11-30 MED ORDER — OXYCODONE HCL 5 MG/5ML PO SOLN: 5.0000 mg | Freq: Once | ORAL | Status: DC | PRN

## 2023-11-30 MED ORDER — MIDAZOLAM HCL 2 MG/2ML IJ SOLN
INTRAMUSCULAR | Status: DC | PRN
  Administered 2023-11-30: 2 mg via INTRAVENOUS

## 2023-11-30 MED ORDER — 0.9 % SODIUM CHLORIDE (POUR BTL) OPTIME
TOPICAL | Status: DC | PRN
Start: 2023-11-30 — End: 2023-11-30
  Administered 2023-11-30: 1000 mL

## 2023-11-30 MED ORDER — TOBRAMYCIN SULFATE 1.2 G IJ SOLR
INTRAMUSCULAR | Status: AC
Start: 2023-11-30 — End: 2023-11-30
  Filled 2023-11-30: qty 1.2

## 2023-11-30 MED ORDER — DIPHENHYDRAMINE HCL 12.5 MG/5ML PO ELIX: 12.5000 mg | ORAL_SOLUTION | ORAL | Status: DC | PRN

## 2023-11-30 MED ORDER — HYDROMORPHONE HCL 1 MG/ML IJ SOLN
1.0000 mg | INTRAMUSCULAR | Status: DC | PRN
  Administered 2023-11-30 (×2): 1 mg via INTRAVENOUS

## 2023-11-30 MED ORDER — FENTANYL CITRATE (PF) 100 MCG/2ML IJ SOLN
25.0000 ug | INTRAMUSCULAR | Status: DC | PRN
  Administered 2023-11-30 (×3): 50 ug via INTRAVENOUS

## 2023-11-30 SURGICAL SUPPLY — 47 items
BAG COUNTER SPONGE SURGICOUNT (BAG) ×1 IMPLANT
BLADE CLIPPER SURG (BLADE) ×1 IMPLANT
BLADE SAW SGTL 18.5X63.X.64 HD (BLADE) ×1 IMPLANT
BNDG COMPR ESMARK 6X3 LF (GAUZE/BANDAGES/DRESSINGS) IMPLANT
BNDG ELASTIC 4X5.8 VLCR STR LF (GAUZE/BANDAGES/DRESSINGS) IMPLANT
BNDG ELASTIC 6INX 5YD STR LF (GAUZE/BANDAGES/DRESSINGS) IMPLANT
BNDG GAUZE DERMACEA FLUFF 4 (GAUZE/BANDAGES/DRESSINGS) IMPLANT
CANISTER WOUNDNEG PRESSURE 500 (CANNISTER) IMPLANT
CHLORAPREP W/TINT 26 (MISCELLANEOUS) ×1 IMPLANT
COVER SURGICAL LIGHT HANDLE (MISCELLANEOUS) ×1 IMPLANT
CUFF TOURN SGL QUICK 42 (TOURNIQUET CUFF) ×2 IMPLANT
CUFF TRNQT CYL 34X4.125X (TOURNIQUET CUFF) ×1 IMPLANT
DRAPE SURG ORHT 6 SPLT 77X108 (DRAPES) IMPLANT
DRAPE U-SHAPE 47X51 STRL (DRAPES) ×1 IMPLANT
DRESSING PEEL AND PLAC PRVNA20 (GAUZE/BANDAGES/DRESSINGS) IMPLANT
DRSG ADAPTIC 3X8 NADH LF (GAUZE/BANDAGES/DRESSINGS) IMPLANT
ELECTRODE REM PT RTRN 9FT ADLT (ELECTROSURGICAL) ×1 IMPLANT
GAUZE PAD ABD 8X10 STRL (GAUZE/BANDAGES/DRESSINGS) IMPLANT
GAUZE SPONGE 4X4 12PLY STRL (GAUZE/BANDAGES/DRESSINGS) ×1 IMPLANT
GAUZE XEROFORM 5X9 LF (GAUZE/BANDAGES/DRESSINGS) IMPLANT
GLOVE BIO SURGEON STRL SZ 6.5 (GLOVE) ×2 IMPLANT
GLOVE BIO SURGEON STRL SZ7.5 (GLOVE) ×1 IMPLANT
GLOVE BIOGEL PI IND STRL 6.5 (GLOVE) IMPLANT
GLOVE BIOGEL PI IND STRL 7.5 (GLOVE) ×1 IMPLANT
GOWN STRL REUS W/ TWL LRG LVL3 (GOWN DISPOSABLE) ×3 IMPLANT
KIT BASIN OR (CUSTOM PROCEDURE TRAY) ×1 IMPLANT
KIT TURNOVER KIT B (KITS) ×1 IMPLANT
MANIFOLD NEPTUNE II (INSTRUMENTS) ×1 IMPLANT
PACK ORTHO EXTREMITY (CUSTOM PROCEDURE TRAY) ×1 IMPLANT
PAD ARMBOARD POSITIONER FOAM (MISCELLANEOUS) ×1 IMPLANT
PAD CAST 4YDX4 CTTN HI CHSV (CAST SUPPLIES) IMPLANT
PADDING CAST COTTON 6X4 STRL (CAST SUPPLIES) IMPLANT
SOLN 0.9% NACL 1000 ML (IV SOLUTION) ×1 IMPLANT
SOLN 0.9% NACL POUR BTL 1000ML (IV SOLUTION) ×1 IMPLANT
SOLN STERILE WATER 1000 ML (IV SOLUTION) ×1 IMPLANT
SOLN STERILE WATER BTL 1000 ML (IV SOLUTION) ×1 IMPLANT
STAPLER SKIN PROX 35W (STAPLE) IMPLANT
SUCTION TUBE FRAZIER 10FR DISP (SUCTIONS) IMPLANT
SUT ETHILON 3 0 PS 1 (SUTURE) ×1 IMPLANT
SUT MON AB 2-0 CT1 36 (SUTURE) ×2 IMPLANT
SUT NYLON 3 0 (SUTURE) IMPLANT
SUT PDS AB 0 CT 36 (SUTURE) IMPLANT
SUT SILK 2 0 TIES 10X30 (SUTURE) IMPLANT
SUT SILK 2-0 18XBRD TIE 12 (SUTURE) ×1 IMPLANT
SUT VIC AB 0 CT1 27XBRD ANBCTR (SUTURE) ×1 IMPLANT
TOWEL GREEN STERILE (TOWEL DISPOSABLE) ×1 IMPLANT
UNDERPAD 30X36 HEAVY ABSORB (UNDERPADS AND DIAPERS) ×1 IMPLANT

## 2023-11-30 NOTE — Anesthesia Procedure Notes (Signed)
 Procedure Name: Intubation Date/Time: 11/30/2023 8:33 AM  Performed by: Cindie Donald CROME, CRNAPre-anesthesia Checklist: Patient identified, Emergency Drugs available, Suction available and Patient being monitored Patient Re-evaluated:Patient Re-evaluated prior to induction Oxygen Delivery Method: Circle System Utilized Preoxygenation: Pre-oxygenation with 100% oxygen Induction Type: IV induction Ventilation: Mask ventilation without difficulty Laryngoscope Size: Glidescope and 4 Grade View: Grade I Tube type: Oral Tube size: 7.5 mm Number of attempts: 1 Airway Equipment and Method: Stylet Placement Confirmation: ETT inserted through vocal cords under direct vision, positive ETCO2 and breath sounds checked- equal and bilateral Secured at: 22 cm Tube secured with: Tape Dental Injury: Teeth and Oropharynx as per pre-operative assessment

## 2023-11-30 NOTE — Interval H&P Note (Signed)
 History and Physical Interval Note:  11/30/2023 8:12 AM  Jonathan Ibarra  has presented today for surgery, with the diagnosis of Chronic osteomyelitis.  The various methods of treatment have been discussed with the patient and family. After consideration of risks, benefits and other options for treatment, the patient has consented to  Procedure(s): AMPUTATION, ABOVE KNEE (Right) as a surgical intervention.  The patient's history has been reviewed, patient examined, no change in status, stable for surgery.  I have reviewed the patient's chart and labs.  Questions were answered to the patient's satisfaction.     Kaelei Wheeler P Cameran Pettey

## 2023-11-30 NOTE — Progress Notes (Signed)
 Orthopedic Tech Progress Note Patient Details:  Jonathan Ibarra 09-Sep-1965 [id number]  Patient has an order for an OVER HEAD FRAME but due to his weight he can not get frame, in house frames weight limit is 113 kg (250 lbs) and patient is currently weighing in at 122.5 kg (270 lbs)   Patient ID: [name], male   DOB: 09-24-1965, 58 y.o.   MRN: [id number]  Delanna LITTIE Pac 11/30/2023, 12:36 PM

## 2023-11-30 NOTE — Plan of Care (Signed)
   Problem: Clinical Measurements: Goal: Ability to maintain clinical measurements within normal limits will improve Outcome: Progressing

## 2023-11-30 NOTE — Op Note (Signed)
 Orthopaedic Surgery Operative Note (CSN: [id number] ) Date of Surgery: 11/30/2023  Admit Date: 11/30/2023   Diagnoses: Pre-Op Diagnoses: Right proximal tibia chronic osteomyelitis  Post-Op Diagnosis: Same  Procedures: CPT 27590-Right above knee amputation  Surgeons : Primary: Kendal Franky SQUIBB, MD  Assistant: Lauraine Moores, PA-C  Location: OR 3   Anesthesia: General   Antibiotics: Ancef  3g preop and 1gm vancomycin  IV with 1 gm vancomycin  powder and 1.2 gm tobramycin  powder placed topically   Tourniquet time:  Total Tourniquet Time Documented: Thigh (Right) - 42 minutes Total: Thigh (Right) - 42 minutes  Estimated Blood Loss: 250 mL  Complications:* No complications entered in OR log *   Specimens: ID Type Source Tests Collected by Time Destination  1 : Right above knee amputation Tissue PATH Amputaion Arm/Leg SURGICAL PATHOLOGY Kendal Franky SQUIBB, MD 11/30/2023 [phone]      Implants: * No implants in log *   Indications for Surgery: 58 year old male who underwent open reduction internal fixation of bicondylar tibial plateau fracture in October of last year.  He subsequently developed a postoperative infection.  He underwent multiple I&D's but unfortunately developed chronic osteomyelitis that was refractory to all treatment and all surgery.  Repeat MRI showed that he had recurrence of his intraosseous osteomyelitis.  I recommend proceeding with a right above-knee amputation.  Risks and benefits were discussed with the patient.  Risks include but not limited to bleeding, infection, need for revision surgery, nerve and blood vessel injury, DVT, even possible anesthetic complications.  He agreed to proceed with surgery and consent was obtained.  Operative Findings: 1.  Right above-knee amputation through femur without complication.  Procedure: The patient was identified in the preoperative holding area. Consent was confirmed with the patient and their family and all questions were  answered. The operative extremity was marked after confirmation with the patient. he was then brought back to the operating room by our anesthesia colleagues.  He was carefully transferred over to radiolucent flattop table.  He was placed under general anesthetic.  A bump was placed under his operative hip.  The right lower extremity was then prepped and draped in usual sterile fashion.  A timeout was performed to verify the patient, the procedure, and the extremity.  Preoperative antibiotics were dosed.  A sterile tourniquet was placed to the upper thigh.  The tourniquet was inflated to 300 mmHg.  Total tourniquet time as noted above.  [place]mouth incision was made and carried down through skin and subcutaneous tissue.  I incised through the anterior musculature through the quadriceps muscle until I reached the femur as well as the intermuscular septum.  I carefully dissected through the saphenous vein and tied this off.  I then incised through the adductor muscles.  Once I had the femur exposed appropriately I then performed transection through the femur.  Once I had the femur cut through I then proceeded to go through the posterior structures.  I identified the vascular bundle and tied this off.  I identified the sciatic nerve tension this injected Marcaine into the nerve and transected the nerve sharply with electrocautery.  I then completed the release of the posterior soft tissues and the posterior skin.  I then deflated the tourniquet and confirmed that all the bleeders were tied off.  I then irrigated the wound.  I then performed a myodesis with the anterior compartment musculature and medial compartment musculature and the posterior compartment musculature.  I used 0 PDS did perform this.  I then placed a  gram of vancomycin  powder and 1.2 g of tobramycin  powder.  I then closed the incision using 2-0 Monocryl and 3-0 nylon.  A incisional wound VAC was then placed.  The leg was dressed appropriately.  The  patient was then awoke from anesthesia and taken to the PACU in stable condition.  Post Op Plan/Instructions: The patient be nonweightbearing to the right lower extremity.  He will receive postoperative Ancef .  He will be placed on aspirin  for DVT prophylaxis.  Will have him mobilize with physical therapy.  He will be admitted to the hospital.  He  I was present and performed the entire surgery.  Lauraine Moores, PA-C did assist me throughout the case. An assistant was necessary given the difficulty in approach, and need for assistance to complete the procedure.   Franky Light, MD [facility]

## 2023-11-30 NOTE — Transfer of Care (Signed)
 Immediate Anesthesia Transfer of Care Note  Patient: Jonathan Ibarra  Procedure(s) Performed: AMPUTATION, ABOVE KNEE (Right: Knee)  Patient Location: PACU  Anesthesia Type:General  Level of Consciousness: awake, alert , oriented, drowsy, and patient cooperative  Airway & Oxygen Therapy: Patient Spontanous Breathing and Patient connected to face mask oxygen  Post-op Assessment: Report given to RN and Post -op Vital signs reviewed and stable  Post vital signs: Reviewed and stable  Last Vitals:  Vitals Value Taken Time  BP 132/87 11/30/23 10:15  Temp    Pulse 84 11/30/23 10:17  Resp 20 11/30/23 10:17  SpO2 99 % 11/30/23 10:17  Vitals shown include unfiled device data.  Last Pain:  Vitals:   11/30/23 0716  TempSrc:   PainSc: 10-Worst pain ever      Patients Stated Pain Goal: 3 (11/30/23 0716)  Complications: No notable events documented.

## 2023-11-30 NOTE — Anesthesia Postprocedure Evaluation (Signed)
 Anesthesia Post Note  Patient: Jonathan Ibarra  Procedure(s) Performed: AMPUTATION, ABOVE KNEE (Right: Knee)     Patient location during evaluation: PACU Anesthesia Type: General Level of consciousness: awake and alert Pain management: pain level controlled Vital Signs Assessment: post-procedure vital signs reviewed and stable Respiratory status: spontaneous breathing, nonlabored ventilation, respiratory function stable and patient connected to nasal cannula oxygen Cardiovascular status: blood pressure returned to baseline and stable Postop Assessment: no apparent nausea or vomiting Anesthetic complications: no   No notable events documented.  Last Vitals:  Vitals:   11/30/23 1115 11/30/23 1138  BP: (!) 149/90 139/88  Pulse: 80 80  Resp: 16   Temp: 36.5 C 36.5 C  SpO2: 100% 100%    Last Pain:  Vitals:   11/30/23 1138  TempSrc: Oral  PainSc:                  Lynwood MARLA Cornea

## 2023-12-01 ENCOUNTER — Encounter (HOSPITAL_COMMUNITY): Payer: Self-pay | Admitting: Student

## 2023-12-01 LAB — BASIC METABOLIC PANEL WITH GFR
Anion gap: 6 (ref 5–15)
BUN: 23 mg/dL — ABNORMAL HIGH (ref 6–20)
CO2: 22 mmol/L (ref 22–32)
Calcium: 8.4 mg/dL — ABNORMAL LOW (ref 8.9–10.3)
Chloride: 104 mmol/L (ref 98–111)
Creatinine, Ser: 0.84 mg/dL (ref 0.61–1.24)
GFR, Estimated: >60 mL/min (ref >60)
Glucose, Bld: 148 mg/dL — ABNORMAL HIGH (ref 70–99)
Potassium: 4.6 mmol/L (ref 3.5–5.1)
Sodium: 132 mmol/L — ABNORMAL LOW (ref 135–145)

## 2023-12-01 LAB — CBC
HCT: 30.3 % — ABNORMAL LOW (ref 39.0–52.0)
Hemoglobin: 10 g/dL — ABNORMAL LOW (ref 13.0–17.0)
MCH: 27 pg (ref 26.0–34.0)
MCHC: 33 g/dL (ref 30.0–36.0)
MCV: 81.9 fL (ref 80.0–100.0)
Platelets: 305 K/uL (ref 150–400)
RBC: 3.7 MIL/uL — ABNORMAL LOW (ref 4.22–5.81)
RDW: 16.8 % — ABNORMAL HIGH (ref 11.5–15.5)
WBC: 8.9 K/uL (ref 4.0–10.5)
nRBC: 0 % (ref 0.0–0.2)

## 2023-12-01 NOTE — Progress Notes (Signed)
 Orthopaedic Trauma Progress Note  SUBJECTIVE: Doing okay this morning.  Notes pain in the right leg.  Has not been out of bed yet since surgery.  No chest pain. No SOB. No nausea/vomiting. No other complaints.  No BM since admission.  Tolerating diet and fluids.  Patient nervous but eager to work with therapies  OBJECTIVE:  Vitals:   12/01/23 0311 12/01/23 0700  BP: 113/65 109/66  Pulse: 67 68  Resp: 17 18  Temp: 97.7 F (36.5 C) 98 F (36.7 C)  SpO2: 95% 98%    Opiates Today (MME): Today's  total administered Morphine  Milligram Equivalents: 30 Opiates Yesterday (MME): Yesterday's total administered Morphine  Milligram Equivalents: 210  General: Sitting up in bed, no acute distress.  Pleasant and cooperative Respiratory: No increased work of breathing.  Operative Extremity (RLE): Dressing stable.  Incisional wound VAC with good seal and functioning, no output in canister.  LABS:  Results for orders placed or performed during the hospital encounter of 11/30/23 (from the past 24 hours)  Basic metabolic panel     Status: Abnormal   Collection Time: 12/01/23  4:03 AM  Result Value Ref Range   Sodium 132 (L) 135 - 145 mmol/L   Potassium 4.6 3.5 - 5.1 mmol/L   Chloride 104 98 - 111 mmol/L   CO2 22 22 - 32 mmol/L   Glucose, Bld 148 (H) 70 - 99 mg/dL   BUN 23 (H) 6 - 20 mg/dL   Creatinine, Ser 9.15 0.61 - 1.24 mg/dL   Calcium  8.4 (L) 8.9 - 10.3 mg/dL   GFR, Estimated >39 >39 mL/min   Anion gap 6 5 - 15  CBC     Status: Abnormal   Collection Time: 12/01/23  4:03 AM  Result Value Ref Range   WBC 8.9 4.0 - 10.5 K/uL   RBC 3.70 (L) 4.22 - 5.81 MIL/uL   Hemoglobin 10.0 (L) 13.0 - 17.0 g/dL   HCT 69.6 (L) 60.9 - 47.9 %   MCV 81.9 80.0 - 100.0 fL   MCH 27.0 26.0 - 34.0 pg   MCHC 33.0 30.0 - 36.0 g/dL   RDW 83.1 (H) 88.4 - 84.4 %   Platelets 305 150 - 400 K/uL   nRBC 0.0 0.0 - 0.2 %    ASSESSMENT: Jonathan Ibarra is a 58 y.o. male, 1 Day Post-Op RIGHT ABOVE-KNEE AMPUTATION FOR  CHRONIC OSTEOMYELITIS  CV/Blood loss: Acute blood loss anemia, Hgb 10.0 this AM. Hemodynamically stable  PLAN: Weightbearing: NWB RLE ROM: Unrestricted Incisional and dressing care: Reinforce dressings as needed  Showering: Hold off on showering for now Orthopedic device(s): Incisional wound VAC to RLE x 1 week Pain management:  Robaxin  3 times daily  Oxycodone  q 4 hours PRN Dilaudid  q 3 hours PRN Valium q 8 hours PRN Cymbalta daily Neurontin  3 times daily VTE prophylaxis: Aspirin , SCD LLE ID:  Ancef  2gm post op Foley/Lines:  No foley, KVO IVFs Dispo: PT/OT evaluation today.  Plan will be for patient to return to SNF at discharge   D/C recommendations: - Oxycodone , Robaxin , gabapentin  for pain control - Aspirin  325 mg daily x 30 days for DVT prophylaxis  Follow - up plan: 1 week after d/c for wound check and wound VAC removal   Contact information:  Franky Light MD, Lauraine Moores PA-C. After hours and holidays please check [email or web address] for group call information for [facility]   Lauraine PATRIC Moores, PA-C [phone] (office) [email or web address]

## 2023-12-01 NOTE — Plan of Care (Signed)

## 2023-12-01 NOTE — [place] FL2 (Signed)
 [place]  MEDICAID [id number] LEVEL OF CARE FORM     IDENTIFICATION  Patient Name: Jonathan Ibarra Birthdate: 1966-01-14 Sex: male Admission Date (Current Location): 11/30/2023  [place] and [place] Number:  Lloyd [id number] Q Facility and Address:  [facility], [address] [place], [place] [address]      Provider Number: [id number]  Attending Physician Name and Address:  Kendal Franky SQUIBB, MD  Relative Name and Phone Number:       Current Level of Care: Hospital Recommended Level of Care: Skilled Nursing Facility Prior Approval Number:    Date Approved/Denied:   PASRR Number: [id number]  Discharge Plan: SNF    Current Diagnoses: Patient Active Problem List   Diagnosis Date Noted   Chronic osteomyelitis of right tibia (HCC) 11/30/2023   Osteomyelitis (HCC) 06/30/2023   Prosthetic joint infection, subsequent encounter 06/27/2023   Bacteremia 04/21/2023   MRSA bacteremia 04/17/2023   Effusion of right knee joint 04/17/2023   Septic joint of right knee joint (HCC) 04/16/2023   Atrial fibrillation, chronic (HCC) 04/16/2023   Wound infection complicating hardware, subsequent encounter 02/04/2023   Drainage from wound 02/04/2023   Knee effusion, right 12/28/2022   Septic arthritis of knee, right (HCC) 12/28/2022   Fall from bridge, initial encounter 12/13/2022   Atrial fibrillation with rapid ventricular response (HCC) 10/01/2022   SIRS without infection with organ dysfunction (HCC) 10/01/2022   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   Transaminitis 02/07/2015   Polysubstance abuse (HCC) 02/06/2015   Hypokalemia 02/06/2015   Hepatic encephalopathy (HCC) 02/04/2015   Thrombocytopenia 02/04/2015   Essential hypertension 02/04/2015   Hepatic cirrhosis (HCC) 02/04/2015    Orientation RESPIRATION BLADDER Height & Weight     Self, Time, Situation, Place  Normal   Weight: 270 lb (122.5 kg) Height:  6' 1 (185.4 cm)   BEHAVIORAL SYMPTOMS/MOOD NEUROLOGICAL BOWEL NUTRITION STATUS        Diet (see discharge summary)  AMBULATORY STATUS COMMUNICATION OF NEEDS Skin   Extensive Assist   Surgical wounds                       Personal Care Assistance Level of Assistance  Bathing, Feeding, Dressing Bathing Assistance: Limited assistance Feeding assistance: Independent Dressing Assistance: Limited assistance     Functional Limitations Info  Sight, Hearing, Speech Sight Info: Adequate Hearing Info: Adequate Speech Info: Adequate    SPECIAL CARE FACTORS FREQUENCY  PT (By licensed PT), OT (By licensed OT)     PT Frequency: 5x week OT Frequency: 5x week            Contractures Contractures Info: Not present    Additional Factors Info  Code Status, Allergies Code Status Info: full Allergies Info: tylenol            Current Medications (12/01/2023):  This is the current hospital active medication list Current Facility-Administered Medications  Medication Dose Route Frequency Provider Last Rate Last Admin   0.9 %  sodium chloride  infusion   Intravenous Continuous Danton Lauraine LABOR, PA-C 50 mL/hr at 12/01/23 1036 New Bag at 12/01/23 1036   ascorbic acid (VITAMIN C) tablet 1,000 mg  1,000 mg Oral Daily Danton Lauraine LABOR, PA-C   1,000 mg at 12/01/23 1015   aspirin  tablet 325 mg  325 mg Oral Daily McClung, Sarah A, PA-C       bisacodyl  (DULCOLAX) EC tablet 5 mg  5 mg Oral Daily PRN Danton Lauraine LABOR, PA-C  diazepam (VALIUM) tablet 5 mg  5 mg Oral Q8H PRN Danton Lauraine LABOR, PA-C       diphenhydrAMINE  (BENADRYL ) 12.5 MG/5ML elixir 12.5-25 mg  12.5-25 mg Oral Q4H PRN Danton Lauraine LABOR, PA-C       docusate sodium  (COLACE) capsule 100 mg  100 mg Oral BID Danton Lauraine LABOR, PA-C   100 mg at 12/01/23 1013   DULoxetine (CYMBALTA) DR capsule 30 mg  30 mg Oral Daily Danton Lauraine LABOR, PA-C   30 mg at 12/01/23 1015   ferrous sulfate tablet 325 mg  325 mg Oral Q breakfast Danton Lauraine LABOR, PA-C   325 mg at  12/01/23 1015   furosemide  (LASIX ) tablet 40 mg  40 mg Oral Daily Danton Lauraine LABOR, PA-C   40 mg at 12/01/23 1013   gabapentin  (NEURONTIN ) capsule 800 mg  800 mg Oral TID Danton Lauraine LABOR, PA-C   800 mg at 12/01/23 1013   hydrALAZINE  (APRESOLINE ) tablet 10 mg  10 mg Oral Q6H PRN Danton Lauraine LABOR, PA-C       HYDROmorphone  (DILAUDID ) injection 1 mg  1 mg Intravenous Q3H PRN Danton Lauraine LABOR, PA-C   1 mg at 11/30/23 1624   ketorolac  (TORADOL ) 15 MG/ML injection 30 mg  30 mg Intravenous Q6H Danton Lauraine A, PA-C   30 mg at 12/01/23 1254   lactulose  (CHRONULAC ) 10 GM/15ML solution 60 g  60 g Oral TID Danton Lauraine LABOR, PA-C   60 g at 12/01/23 1029   lamoTRIgine (LAMICTAL) tablet 25 mg  25 mg Oral Daily Danton Lauraine LABOR, PA-C   25 mg at 12/01/23 1024   magnesium  citrate solution 1 Bottle  1 Bottle Oral Once PRN Danton Lauraine LABOR, PA-C       melatonin tablet 5 mg  5 mg Oral QHS Danton Lauraine LABOR, PA-C   5 mg at 11/30/23 2110   methocarbamol  (ROBAXIN ) tablet 1,000 mg  1,000 mg Oral TID PC McClung, Sarah A, PA-C   1,000 mg at 12/01/23 1253   metoCLOPramide  (REGLAN ) tablet 5-10 mg  5-10 mg Oral Q8H PRN Danton Lauraine LABOR, PA-C       Or   metoCLOPramide  (REGLAN ) injection 5-10 mg  5-10 mg Intravenous Q8H PRN Danton Lauraine LABOR, PA-C       metoprolol  succinate (TOPROL -XL) 24 hr tablet 50 mg  50 mg Oral Daily Danton Lauraine A, PA-C   50 mg at 12/01/23 1014   ondansetron  (ZOFRAN ) tablet 4 mg  4 mg Oral Q6H PRN Danton Lauraine LABOR, PA-C       Or   ondansetron  (ZOFRAN ) injection 4 mg  4 mg Intravenous Q6H PRN Danton Lauraine LABOR, PA-C       oxyCODONE  (Oxy IR/ROXICODONE ) immediate release tablet 15-20 mg  15-20 mg Oral Q4H PRN Danton Lauraine LABOR, PA-C   20 mg at 12/01/23 0435   pantoprazole  (PROTONIX ) EC tablet 40 mg  40 mg Oral BID Danton Lauraine LABOR, PA-C   40 mg at 12/01/23 1015   polyethylene glycol (MIRALAX  / GLYCOLAX ) packet 17 g  17 g Oral Daily PRN Danton Lauraine LABOR, PA-C       spironolactone  (ALDACTONE ) tablet 12.5 mg   12.5 mg Oral q AM McClung, Sarah A, PA-C   12.5 mg at 12/01/23 9394     Discharge Medications: Please see discharge summary for a list of discharge medications.  Relevant Imaging Results:  Relevant Lab Results:   Additional Information SSN [id number]  Bridget, Cordella Simmonds, [place]

## 2023-12-01 NOTE — Evaluation (Signed)
 Physical Therapy Evaluation Patient Details Name: Cage Gupton MRN: [id number] DOB: March 27, 1965 Today's Date: 12/01/2023  History of Present Illness  Pt is a 58 y.o. male admitted on 11/29/23 for R AKA after fall with R tibial plateau fracture in October 2024 and multiple falls and infections since. PMH: cirrhosis, afib, HTN, SVT, polysubstance abuse.  Clinical Impression  Pt presents with admitting diagnosis above. Co-treat with OT. Pt today was able to step pivot transfer to chair with +2 Min A RW. PTA pt was at [facility] where he states that he was working with PT. Patient will benefit from continued inpatient follow up therapy, <3 hours/day. PT will continue to follow.         If plan is discharge home, [name];[name];Assistance with cooking/housework;Direct supervision/assist for medications management;Assist for transportation;Help with stairs or ramp for entrance;Supervision due to cognitive status   Can travel by private vehicle   No    Equipment Recommendations None recommended by PT  Recommendations for Other Services       Functional Status Assessment Patient has had a recent decline in their functional status and demonstrates the ability to make significant improvements in function in a reasonable and predictable amount of time.     Precautions / Restrictions Precautions Precautions: Fall Recall of Precautions/Restrictions: Intact Restrictions Weight Bearing Restrictions Per Provider Order: Yes RLE Weight Bearing Per Provider Order: Non weight bearing Other Position/Activity Restrictions: R LE wound vac      Mobility  Bed Mobility Overal bed mobility: Needs Assistance Bed Mobility: Supine to Sit     Supine to sit: Supervision, [name]     General bed mobility comments: supervision for lines    Transfers Overall transfer level: Needs assistance Equipment  used: Rolling walker (2 wheels) Transfers: Sit to/from Stand, Bed to chair/wheelchair/BSC Sit to Stand: Min assist, +2 safety/equipment   Step pivot transfers: Min assist, +2 safety/equipment       General transfer comment: cues for hand placement and to stand momentarily prior to attempt to hop, loose dressing on R LE upon standing which slipped off, RN notified, wound vac remained intact    Ambulation/Gait               General Gait Details: Deferred due to dressing falling off during transfer.  Stairs            Wheelchair Mobility     Tilt Bed    Modified Rankin (Stroke Patients Only)       Balance Overall balance assessment: Needs assistance   Sitting balance-Leahy Scale: Good       Standing balance-Leahy Scale: Poor Standing balance comment: reliant on RW                             Pertinent Vitals/Pain Pain Assessment Pain Assessment: Faces Faces Pain Scale: Hurts little more Pain Location: R LE Pain Descriptors / Indicators: Grimacing, Guarding Pain Intervention(s): Monitored during session, Limited activity within patient's tolerance, Repositioned    Home Living Family/patient expects to be discharged to:: Skilled nursing facility                        Prior Function Prior Level of Function : Needs assist             Mobility Comments: Using RW with assist at SNF. ADLs Comments: Assisted for  ADLs and IADLs as needed at [facility]     Extremity/Trunk Assessment   Upper Extremity Assessment Upper Extremity Assessment: Overall WFL for tasks assessed    Lower Extremity Assessment Lower Extremity Assessment: RLE deficits/detail RLE Deficits / Details: R AKA    Cervical / Trunk Assessment Cervical / Trunk Assessment: Normal  Communication   Communication Communication: No apparent difficulties    Cognition Arousal: Alert Behavior During Therapy: Impulsive                             Following  commands: Intact       Cueing Cueing Techniques: Verbal cues     General Comments General comments (skin integrity, edema, etc.): VSS    Exercises     Assessment/Plan    PT Assessment Patient needs continued PT services  PT Problem List Decreased strength;Decreased range of motion;Decreased activity tolerance;Decreased balance;Decreased mobility;Decreased coordination;Decreased knowledge of use of DME;Decreased safety awareness;Decreased knowledge of precautions;Cardiopulmonary status limiting activity       PT Treatment Interventions DME instruction;Gait training;Stair training;Functional mobility training;Therapeutic activities;Therapeutic exercise;Neuromuscular re-education;Balance training;Cognitive remediation;Patient/family education    PT Goals (Current goals can be found in the Care Plan section)  Acute Rehab PT Goals Patient Stated Goal: to get back to walking PT Goal Formulation: With patient Time For Goal Achievement: 12/15/23 Potential to Achieve Goals: Good    Frequency Min 3X/week     Co-evaluation PT/OT/SLP Co-Evaluation/Treatment: Yes Reason for Co-Treatment: For patient/therapist safety PT goals addressed during session: Mobility/safety with mobility;Proper use of DME OT goals addressed during session: ADL's and self-care       AM-PAC PT 6 Clicks Mobility  Outcome Measure Help needed turning from your back to your side while in a flat bed without using bedrails?: A Little Help needed moving from lying on your back to sitting on the side of a flat bed without using bedrails?: A Little Help needed moving to and from a bed to a chair (including a wheelchair)?: A Little Help needed standing up from a chair using your arms (e.g., wheelchair or bedside chair)?: A Little Help needed to walk in hospital room?: A Lot Help needed climbing 3-5 steps with a railing? : Total 6 Click Score: 15    End of Session Equipment Utilized During Treatment: Gait  belt Activity Tolerance: Patient tolerated treatment well Patient left: in chair;with call bell/phone within reach;with chair alarm set Nurse Communication: Mobility status PT Visit Diagnosis: Other abnormalities of gait and mobility (R26.89)    Time: 8965-8942 PT Time Calculation (min) (ACUTE ONLY): 23 min   Charges:   PT Evaluation $PT Eval Moderate Complexity: 1 Mod   PT General Charges $$ ACUTE PT VISIT: 1 Visit         Sueellen NOVAK, PT, DPT Acute Rehab Services [phone]   Miachel Nardelli 12/01/2023, 1:53 PM

## 2023-12-01 NOTE — Progress Notes (Signed)
 Orthopedic Tech Progress Note Patient Details:  Jonathan Ibarra 09-24-1965 [id number]  [name], male   DOB: 1965/05/19, 58 y.o.   MRN: [id number] Called RLE shrinker into [facility]  Jonathan Ibarra OTR/L 12/01/2023, 11:45 AM

## 2023-12-01 NOTE — Evaluation (Signed)
 Occupational Therapy Evaluation Patient Details Name: Jonathan Ibarra MRN: [id number] DOB: 02-May-1965 Today's Date: 12/01/2023   History of Present Illness   Pt is a 58 y.o. male admitted on 11/29/23 for R AKA after fall with R tibial plateau fracture in October 2024 and multiple falls and infections since. PMH: cirrhosis, afib, HTN, SVT, polysubstance abuse.     Clinical Impressions Pt was walking with assistance with a RW at SNF prior to admission. He was assisted as needed with ADLs and IADLs. Pt presents with post operative pain in R LE with decreased standing balance. No physical assist needed for bed mobility. He transferred bed to chair with min assist and RW. Pt requires set up to moderate assistance for ADLs. Patient will benefit from continued inpatient follow up therapy, <3 hours/day. He is highly motivated to maintain mobility and strength in preparation for R LE prosthesis.      If plan is discharge home, recommend the following:   A little help with walking and/or transfers;A lot of help with bathing/dressing/bathroom;Assistance with cooking/housework;Direct supervision/assist for medications management;Direct supervision/assist for financial management;Assist for transportation;Help with stairs or ramp for entrance     Functional Status Assessment   Patient has had a recent decline in their functional status and demonstrates the ability to make significant improvements in function in a reasonable and predictable amount of time.     Equipment Recommendations   None recommended by OT     Recommendations for Other Services         Precautions/Restrictions   Precautions Precautions: Fall Recall of Precautions/Restrictions: Intact Restrictions Weight Bearing Restrictions Per Provider Order: Yes RLE Weight Bearing Per Provider Order: Non weight bearing Other Position/Activity Restrictions: R LE wound vac     Mobility Bed Mobility Overal bed mobility:  Needs Assistance Bed Mobility: Supine to Sit     Supine to sit: Supervision, [name]     General bed mobility comments: supervision for lines    Transfers Overall transfer level: Needs assistance Equipment used: Rolling walker (2 wheels) Transfers: Sit to/from Stand, Bed to chair/wheelchair/BSC Sit to Stand: Min assist, +2 safety/equipment     Step pivot transfers: Min assist, +2 safety/equipment     General transfer comment: cues for hand placement and to stand momentarily prior to attempt to hop, loose dressing on R LE upon standing which slipped off, RN notified, wound vac remained intact      Balance Overall balance assessment: Needs assistance   Sitting balance-Leahy Scale: Good       Standing balance-Leahy Scale: Poor Standing balance comment: reliant on RW                           ADL either performed or assessed with clinical judgement   ADL Overall ADL's : Needs assistance/impaired Eating/Feeding: Independent;Sitting   Grooming: Set up;Sitting   Upper Body Bathing: Set up;Sitting   Lower Body Bathing: Moderate assistance;Sitting/lateral leans   Upper Body Dressing : Set up;Sitting   Lower Body Dressing: Moderate assistance;Sitting/lateral leans   Toilet Transfer: Minimal assistance;+2 for safety/equipment;Rolling walker (2 wheels);Stand-pivot   Toileting- Clothing Manipulation and Hygiene: Set up;Sitting/lateral lean               Vision Ability to See in Adequate Light: 0 Adequate Patient Visual Report: No change from baseline       Perception         Praxis         Pertinent  Vitals/Pain Pain Assessment Pain Assessment: Faces Faces Pain Scale: Hurts little more Pain Location: R LE Pain Descriptors / Indicators: Grimacing, Guarding Pain Intervention(s): Monitored during session, Repositioned     Extremity/Trunk Assessment Upper Extremity Assessment Upper Extremity Assessment: Overall WFL for tasks assessed    Lower Extremity Assessment Lower Extremity Assessment: Defer to PT evaluation   Cervical / Trunk Assessment Cervical / Trunk Assessment: Normal   Communication Communication Communication: No apparent difficulties   Cognition Arousal: Alert Behavior During Therapy: Impulsive Cognition: Cognition impaired     Awareness: Online awareness impaired, Intellectual awareness intact                         Following commands: Intact       Cueing  General Comments   Cueing Techniques: Verbal cues      Exercises     Shoulder Instructions      Home Living Family/patient expects to be discharged to:: Skilled nursing facility                                        Prior Functioning/Environment Prior Level of Function : Needs assist             Mobility Comments: Using RW with assist at SNF. ADLs Comments: Assisted for ADLs and IADLs as needed at SNF    OT Problem List: Impaired balance (sitting and/or standing);Pain;Decreased knowledge of precautions;Decreased knowledge of use of DME or AE   OT Treatment/Interventions: Self-care/ADL training;DME and/or AE instruction;Therapeutic activities;Patient/family education;Balance training      OT Goals(Current goals can be found in the care plan section)   Acute Rehab OT Goals OT Goal Formulation: With patient Time For Goal Achievement: 12/15/23 Potential to Achieve Goals: Good ADL Goals Pt Will Perform Lower Body Bathing: sitting/lateral leans;with set-up Pt Will Perform Lower Body Dressing: sitting/lateral leans;with set-up Pt Will Transfer to Toilet: ambulating;bedside commode;with supervision Pt Will Perform Toileting - Clothing Manipulation and hygiene: sitting/lateral leans;with set-up   OT Frequency:  Min 1X/week    Co-evaluation PT/OT/SLP Co-Evaluation/Treatment: Yes Reason for Co-Treatment: For patient/therapist safety   OT goals addressed during session: ADL's and self-care       AM-PAC OT 6 Clicks Daily Activity     Outcome Measure Help from another person eating meals?: None Help from another person taking care of personal grooming?: A Little Help from another person toileting, which includes using toliet, bedpan, or urinal?: A Little Help from another person bathing (including washing, rinsing, drying)?: A Lot Help from another person to put on and taking off regular upper body clothing?: A Little Help from another person to put on and taking off regular lower body clothing?: A Lot 6 Click Score: 17   End of Session Equipment Utilized During Treatment: Rolling walker (2 wheels);Gait belt Nurse Communication: Mobility status;Other (comment) (aware R LE dressing slipped off)  Activity Tolerance: Patient tolerated treatment well Patient left: in chair;with call bell/phone within reach;with chair alarm set  OT Visit Diagnosis: Unsteadiness on feet (R26.81);Other abnormalities of gait and mobility (R26.89);Pain                Time: 1034-1100 OT Time Calculation (min): 26 min Charges:  OT General Charges $OT Visit: 1 Visit OT Evaluation $OT Eval Moderate Complexity: 1 Mod  Mliss HERO, OTR/L Acute Rehabilitation Services Office: [phone]   Kennth Mliss Helling 12/01/2023, 1:02  PM

## 2023-12-02 LAB — CBC
HCT: 29.1 % — ABNORMAL LOW (ref 39.0–52.0)
Hemoglobin: 9.4 g/dL — ABNORMAL LOW (ref 13.0–17.0)
MCH: 27.1 pg (ref 26.0–34.0)
MCHC: 32.3 g/dL (ref 30.0–36.0)
MCV: 83.9 fL (ref 80.0–100.0)
Platelets: 286 K/uL (ref 150–400)
RBC: 3.47 MIL/uL — ABNORMAL LOW (ref 4.22–5.81)
RDW: 17.4 % — ABNORMAL HIGH (ref 11.5–15.5)
WBC: 6 K/uL (ref 4.0–10.5)
nRBC: 0 % (ref 0.0–0.2)

## 2023-12-02 LAB — SURGICAL PATHOLOGY

## 2023-12-02 MED ORDER — DIAZEPAM 5 MG PO TABS: 5.0000 mg | ORAL_TABLET | Freq: Three times a day (TID) | ORAL | 0 refills | Status: AC | PRN

## 2023-12-02 MED ORDER — MELATONIN 10 MG PO TABS: 1.0000 | ORAL_TABLET | Freq: Every evening | ORAL | Status: AC | PRN

## 2023-12-02 MED ORDER — POLYETHYLENE GLYCOL 3350 17 G PO PACK: 17.0000 g | PACK | Freq: Every day | ORAL | Status: AC | PRN

## 2023-12-02 MED ORDER — METHOCARBAMOL 1000 MG PO TABS: 1000.0000 mg | ORAL_TABLET | Freq: Three times a day (TID) | ORAL | 0 refills | Status: AC

## 2023-12-02 MED ORDER — ASPIRIN 325 MG PO TABS: 325.0000 mg | ORAL_TABLET | Freq: Every day | ORAL | 0 refills | Status: AC

## 2023-12-02 MED ORDER — OXYCODONE HCL 15 MG PO TABS: 15.0000 mg | ORAL_TABLET | ORAL | 0 refills | Status: AC | PRN

## 2023-12-02 MED ORDER — ASCORBIC ACID 1000 MG PO TABS: 1000.0000 mg | ORAL_TABLET | Freq: Every day | ORAL | Status: AC

## 2023-12-02 NOTE — Progress Notes (Signed)
 Physical Therapy Treatment Patient Details Name: Jonathan Ibarra MRN: [id number] DOB: 07/28/1965 Today's Date: 12/02/2023   History of Present Illness Pt is a 58 y.o. male admitted on 11/29/23 for R AKA after fall with R tibial plateau fracture in October 2024 and multiple falls and infections since. PMH: cirrhosis, afib, HTN, SVT, polysubstance abuse.    PT Comments  Pt with fair tolerance to treatment today. Pt with 1 major LOB in room requiring +2 Max A to correct. Pt states that his hand slipped off the walker however pt was able to catch himself along with therapist and prevent pt from hitting the ground. Close chair follow provided. 2 standing rest breaks taken. Pt adamant about ambulating in hallway. No change in DC/DME recs at this time. PT will continue to follow.     If plan is discharge home, [name];[name];Assistance with cooking/housework;Direct supervision/assist for medications management;Assist for transportation;Help with stairs or ramp for entrance;Supervision due to cognitive status   Can travel by private vehicle     No  Equipment Recommendations  None recommended by PT    Recommendations for Other Services       Precautions / Restrictions Precautions Precautions: Fall Recall of Precautions/Restrictions: Intact Restrictions Weight Bearing Restrictions Per Provider Order: Yes RLE Weight Bearing Per Provider Order: Non weight bearing     Mobility  Bed Mobility Overal bed mobility: Modified Independent Bed Mobility: Supine to Sit     Supine to sit: Modified independent (Device/Increase time)          Transfers Overall transfer level: Needs assistance Equipment used: Rolling walker (2 wheels) Transfers: Sit to/from Stand Sit to Stand: Contact guard assist           General transfer comment: Increased time. CGA for safety.     Ambulation/Gait Ambulation/Gait assistance: Min assist, Max assist, +2 physical assistance, +2 safety/equipment Gait Distance (Feet): 100 Feet Assistive device: Rolling walker (2 wheels) Gait Pattern/deviations:  (Hop to) Gait velocity: decreased     General Gait Details: Pt with 1 major LOB in room requiring +2 Max A to correct. Pt states that his hand slipped off the walker however pt was able to catch himself along with therapist and prevent pt from hitting the ground. Close chair follow provided. 2 standing rest breaks taken. Pt adamant about ambulating in hallway.   Stairs             Wheelchair Mobility     Tilt Bed    Modified Rankin (Stroke Patients Only)       Balance Overall balance assessment: Needs assistance Sitting-balance support: Bilateral upper extremity supported Sitting balance-Leahy Scale: Good     Standing balance support: Bilateral upper extremity supported, During functional activity Standing balance-Leahy Scale: Poor Standing balance comment: reliant on RW                            Communication Communication Communication: No apparent difficulties  Cognition Arousal: Alert Behavior During Therapy: Impulsive   PT - Cognitive impairments: Awareness, Safety/Judgement                       PT - Cognition Comments: poor safety awareness and awareness of deficits. Following commands: Intact      Cueing Cueing Techniques: Verbal cues  Exercises      General Comments General comments (skin integrity, edema, etc.): VSS  Pertinent Vitals/Pain Pain Assessment Pain Assessment: 0-10 Pain Score: 10-Worst pain ever Pain Location: R LE Pain Descriptors / Indicators: Grimacing, Guarding Pain Intervention(s): Limited activity within patient's tolerance, Monitored during session, Patient requesting pain meds-RN notified    Home Living                          Prior Function            PT Goals  (current goals can now be found in the care plan section) Progress towards PT goals: Progressing toward goals    Frequency    Min 3X/week      PT Plan      Co-evaluation              AM-PAC PT 6 Clicks Mobility   Outcome Measure  Help needed turning from your back to your side while in a flat bed without using bedrails?: A Little Help needed moving from lying on your back to sitting on the side of a flat bed without using bedrails?: A Little Help needed moving to and from a bed to a chair (including a wheelchair)?: A Little Help needed standing up from a chair using your arms (e.g., wheelchair or bedside chair)?: A Little Help needed to walk in hospital room?: A Lot Help needed climbing 3-5 steps with a railing? : Total 6 Click Score: 15    End of Session Equipment Utilized During Treatment: Gait belt Activity Tolerance: Patient tolerated treatment well Patient left: with call bell/phone within reach;in bed Nurse Communication: Mobility status PT Visit Diagnosis: Other abnormalities of gait and mobility (R26.89)     Time: 8565-8540 PT Time Calculation (min) (ACUTE ONLY): 25 min  Charges:    $Gait Training: 23-37 mins PT General Charges $$ ACUTE PT VISIT: 1 Visit                     Jonathan Ibarra, PT, DPT Acute Rehab Services [phone]    Jonathan Ibarra 12/02/2023, 4:18 PM

## 2023-12-02 NOTE — Progress Notes (Signed)
 Orthopaedic Trauma Progress Note  SUBJECTIVE: Doing okay this morning.  Notes more pain in the leg today but recently received Dilaudid  which seemed to help. Did very well with therapies yesterday, is very motivated to get better. No chest pain. No SOB. No nausea/vomiting. No other complaints.  No BM since admission, +flatus.  Tolerating diet and fluids.   OBJECTIVE:  Vitals:   12/02/23 0435 12/02/23 0758  BP: 124/68 117/72  Pulse: 78 74  Resp: 15 16  Temp: 97.6 F (36.4 C) (!) 97.5 F (36.4 C)  SpO2: 97% 98%    Opiates Today (MME): Today's  total administered Morphine  Milligram Equivalents: 42.5 Opiates Yesterday (MME): Yesterday's total administered Morphine  Milligram Equivalents: 50  General: Sitting up in bed, finishing breakfast. No acute distress.  Pleasant and cooperative Respiratory: No increased work of breathing.  Operative Extremity (RLE): Stump shrinker in place.  Incisional wound VAC with good seal and functioning, no output in canister.  LABS:  Results for orders placed or performed during the hospital encounter of 11/30/23 (from the past 24 hours)  CBC     Status: Abnormal   Collection Time: 12/02/23  3:56 AM  Result Value Ref Range   WBC 6.0 4.0 - 10.5 K/uL   RBC 3.47 (L) 4.22 - 5.81 MIL/uL   Hemoglobin 9.4 (L) 13.0 - 17.0 g/dL   HCT 70.8 (L) 60.9 - 47.9 %   MCV 83.9 80.0 - 100.0 fL   MCH 27.1 26.0 - 34.0 pg   MCHC 32.3 30.0 - 36.0 g/dL   RDW 82.5 (H) 88.4 - 84.4 %   Platelets 286 150 - 400 K/uL   nRBC 0.0 0.0 - 0.2 %    ASSESSMENT: Jonathan Ibarra is a 58 y.o. male, 2 Days Post-Op RIGHT ABOVE-KNEE AMPUTATION FOR CHRONIC OSTEOMYELITIS  CV/Blood loss: Acute blood loss anemia, Hgb 9.4 this AM. Hemodynamically stable  PLAN: Weightbearing: NWB RLE ROM: Unrestricted Incisional and dressing care: Reinforce dressings as needed  Showering: Hold off on showering for now Orthopedic device(s): Incisional wound VAC to RLE x 1 week Pain management:  Robaxin  3  times daily  Oxycodone  q 4 hours PRN Dilaudid  q 3 hours PRN Valium q 8 hours PRN Cymbalta daily Neurontin  3 times daily VTE prophylaxis: Aspirin , SCD LLE ID:  Ancef  2gm post op completed Foley/Lines:  No foley, KVO IVFs Dispo: PT/OT evaluation ongoing.  Plan will be for patient to return to SNF at discharge. Tentatively plan for Monday   D/C recommendations: - Oxycodone , Robaxin , gabapentin  for pain control - Aspirin  325 mg daily x 30 days for DVT prophylaxis  Follow - up plan: 1 week after d/c for wound check and wound VAC removal   Contact information:  Franky Light MD, Lauraine Moores PA-C. After hours and holidays please check [email or web address] for group call information for [facility]   Lauraine PATRIC Moores, PA-C [phone] (office) [email or web address]

## 2023-12-02 NOTE — TOC Progression Note (Signed)
 Transition of Care [facility]) - Progression Note    Patient Details  Name: Jonathan Ibarra MRN: [id number] Date of Birth: 09-21-1965  [facility] CM/SW Contact  Bridget Cordella Simmonds, LCSW Phone Number: 12/02/2023, 1:24 PM  Clinical Narrative:   Referral made to [place]/Financial counseling to see if pt qualifies for any disability assistance.  CSW spoke with Brittany/[facility].  SHe confirmed pt is LTC there, can return, no auth needed.     Expected Discharge Plan: Skilled Nursing Facility Barriers to Discharge: Continued Medical Work up               Expected Discharge Plan and Services In-house Referral: Clinical Social Work   Post Acute Care Choice: Skilled Nursing Facility Living arrangements for the past 2 months: Skilled Nursing Facility                                       Social Drivers of Health (SDOH) Interventions SDOH Screenings   Food Insecurity: No Food Insecurity (12/01/2023)  Recent Concern: Food Insecurity - Food Insecurity Present (10/07/2023)   Received from [facility]  Housing: High Risk (12/01/2023)  Transportation Needs: Unmet Transportation Needs (12/01/2023)  Utilities: Low Risk  (11/04/2023)   Received from [facility]  Recent Concern: Utilities - At Risk (10/07/2023)   Received from [facility]  Financial Resource Strain: High Risk (09/05/2023)   Received from [facility]  Social Connections: Unknown (07/05/2021)   Received from [facility]  Stress: No Stress Concern Present (10/07/2023)   Received from [facility]  Tobacco Use: High Risk (11/30/2023)    Readmission Risk Interventions     No data to display

## 2023-12-02 NOTE — Plan of Care (Signed)
  Problem: Health Behavior/Discharge Planning: Goal: Ability to manage health-related needs will improve Outcome: Completed/Met   Problem: Clinical Measurements: Goal: Ability to maintain clinical measurements within normal limits will improve Outcome: Progressing Goal: Will remain free from infection Outcome: Progressing Goal: Diagnostic test results will improve Outcome: Progressing Goal: Respiratory complications will improve Outcome: Progressing

## 2023-12-02 NOTE — Plan of Care (Signed)
  Problem: Education: Goal: Knowledge of General Education information will improve Description: Including pain rating scale, medication(s)/side effects and non-pharmacologic comfort measures Outcome: Progressing   Problem: Clinical Measurements: Goal: Will remain free from infection Outcome: Progressing   Problem: Nutrition: Goal: Adequate nutrition will be maintained Outcome: Progressing   

## 2023-12-02 NOTE — Discharge Instructions (Addendum)
 Orthopaedic Trauma Service Discharge Instructions   General Discharge Instructions  WEIGHT BEARING STATUS:Non-weightbearing right lower extremity  RANGE OF MOTION/ACTIVITY: ok for hip motion  Wound Care: Keep incision wound vac in place until follow up. You should wear stump shrinker 23 out of 24 hours a day. Hold off on getting right leg wet until seen back in the office and wound vac is removed  DVT/PE prophylaxis: Aspirin  325 mg daily x 30 days  Diet: as you were eating previously.  Can use over the counter stool softeners and bowel preparations, such as Miralax , to help with bowel movements.  Narcotics can be constipating.  Be sure to drink plenty of fluids  PAIN MEDICATION USE AND EXPECTATIONS  You have likely been given narcotic medications to help control your pain.  After a traumatic event that results in an fracture (broken bone) with or without surgery, it is ok to use narcotic pain medications to help control one's pain.  We understand that everyone responds to pain differently and each individual patient will be evaluated on a regular basis for the continued need for narcotic medications. Ideally, narcotic medication use should last no more than 6-8 weeks (coinciding with fracture healing).   As a patient it is your responsibility as well to monitor narcotic medication use and report the amount and frequency you use these medications when you come to your office visit.   We would also advise that if you are using narcotic medications, you should take a dose prior to therapy to maximize you participation.  IF YOU ARE ON NARCOTIC MEDICATIONS IT IS NOT PERMISSIBLE TO OPERATE A MOTOR VEHICLE (MOTORCYCLE/CAR/TRUCK/MOPED) OR HEAVY MACHINERY DO NOT MIX NARCOTICS WITH OTHER CNS (CENTRAL NERVOUS SYSTEM) DEPRESSANTS SUCH AS ALCOHOL  POST-OPERATIVE OPIOID TAPER INSTRUCTIONS: It is important to wean off of your opioid medication as soon as possible. If you do not need pain medication  after your surgery it is ok to stop day one. Opioids include: Codeine, Hydrocodone(Norco, Vicodin), Oxycodone (Percocet, oxycontin ) and hydromorphone  amongst others.  Long term and even short term use of opiods can cause: Increased pain response Dependence Constipation Depression Respiratory depression And more.  Withdrawal symptoms can include Flu like symptoms Nausea, vomiting And more Techniques to manage these symptoms Hydrate well Eat regular healthy meals Stay active Use relaxation techniques(deep breathing, meditating, yoga) Do Not substitute Alcohol to help with tapering If you have been on opioids for less than two weeks and do not have pain than it is ok to stop all together.  Plan to wean off of opioids This plan should start within one week post op of your fracture surgery  Maintain the same interval or time between taking each dose and first decrease the dose.  Cut the total daily intake of opioids by one tablet each day Next start to increase the time between doses. The last dose that should be eliminated is the evening dose.    STOP SMOKING OR USING NICOTINE  PRODUCTS!!!!  As discussed nicotine  severely impairs your body's ability to heal surgical and traumatic wounds but also impairs bone healing.  Wounds and bone heal by forming microscopic blood vessels (angiogenesis) and nicotine  is a vasoconstrictor (essentially, shrinks blood vessels).  Therefore, if vasoconstriction occurs to these microscopic blood vessels they essentially disappear and are unable to deliver necessary nutrients to the healing tissue.  This is one modifiable factor that you can do to dramatically increase your chances of healing your injury.  (This means no smoking, no nicotine  gum,  patches, etc)  DO NOT USE NONSTEROIDAL ANTI-INFLAMMATORY DRUGS (NSAID'S)  Using products such as Advil (ibuprofen), Aleve (naproxen), Motrin (ibuprofen) for additional pain control during fracture healing can delay  and/or prevent the healing response.  If you would like to take over the counter (OTC) medication, Tylenol  (acetaminophen ) is ok.  However, some narcotic medications that are given for pain control contain acetaminophen  as well. Therefore, you should not exceed more than 4000 mg of tylenol  in a day if you do not have liver disease.  Also note that there are may OTC medicines, such as cold medicines and allergy medicines that my contain tylenol  as well.  If you have any questions about medications and/or interactions please ask your doctor/PA or your pharmacist.      ICE AND ELEVATE INJURED/OPERATIVE EXTREMITY  Using ice and elevating the injured extremity above your heart can help with swelling and pain control.  Icing in a pulsatile fashion, such as 20 minutes on and 20 minutes off, can be followed.    Do not place ice directly on skin. Make sure there is a barrier between to skin and the ice pack.    Using frozen items such as frozen peas works well as the conform nicely to the are that needs to be iced.  USE AN ACE WRAP OR TED HOSE FOR SWELLING CONTROL  In addition to icing and elevation, Ace wraps or TED hose are used to help limit and resolve swelling.  It is recommended to use Ace wraps or TED hose until you are informed to stop.    When using Ace Wraps start the wrapping distally (farthest away from the body) and wrap proximally (closer to the body)   Example: If you had surgery on your leg or thing and you do not have a splint on, start the ace wrap at the toes and work your way up to the thigh        If you had surgery on your upper extremity and do not have a splint on, start the ace wrap at your fingers and work your way up to the upper arm  CALL THE OFFICE FOR MEDICATION REFILLS OR WITH ANY QUESTIONS/CONCERNS: [phone]   VISIT OUR WEBSITE FOR ADDITIONAL INFORMATION: [email or web address]   Discharge Wound Care Instructions  Do NOT apply any ointments, solutions or lotions to pin  sites or surgical wounds.  These prevent needed drainage and even though solutions like hydrogen peroxide kill bacteria, they also damage cells lining the pin sites that help fight infection.  Applying lotions or ointments can keep the wounds moist and can cause them to breakdown and open up as well. This can increase the risk for infection. When in doubt call the office.  Surgical incisions should be dressed daily.  If any drainage is noted, use one layer of adaptic or Mepitel, then gauze, Kerlix, and an ace wrap. - These dressing supplies should be available at local medical supply stores ([facility], [facility], etc) as well as Insurance claims handler ([employer], [facility], [employer], etc)  Once the incision is completely dry and without drainage, it may be left open to air out.  Showering may begin 36-48 hours later.  Cleaning gently with soap and water.   Call office for the following: Temperature greater than 101F Persistent nausea and vomiting Severe uncontrolled pain Redness, tenderness, or signs of infection (pain, swelling, redness, odor or green/yellow discharge around the site) Difficulty breathing, headache or visual disturbances Hives Persistent dizziness or light-headedness Extreme fatigue Any  other questions or concerns you may have after discharge  In an emergency, call 911 or go to an Emergency Department at a nearby hospital  OTHER HELPFUL INFORMATION  If you had a block, it will wear off between 8-24 hrs postop typically.  This is period when your pain may go from nearly zero to the pain you would have had postop without the block.  This is an abrupt transition but nothing dangerous is happening.  You may take an extra dose of narcotic when this happens.  You should wean off your narcotic medicines as soon as you are able.  Most patients will be off or using minimal narcotics before their first postop appointment.   We suggest you use the pain medication the first night  prior to going to bed, in order to ease any pain when the anesthesia wears off. You should avoid taking pain medications on an empty stomach as it will make you nauseous.  Do not drink alcoholic beverages or take illicit drugs when taking pain medications.  In most states it is against the law to drive while you are in a splint or sling.  And certainly against the law to drive while taking narcotics.  You may return to work/school in the next couple of days when you feel up to it.   Pain medication may make you constipated.  Below are a few solutions to try in this order: Decrease the amount of pain medication if you aren't having pain. Drink lots of decaffeinated fluids. Drink prune juice and/or each dried prunes  If the first 3 don't work start with additional solutions Take Colace - an over-the-counter stool softener Take Senokot - an over-the-counter laxative Take Miralax  - a stronger over-the-counter laxative

## 2023-12-02 NOTE — TOC Initial Note (Signed)
 Transition of Care [facility]) - Initial/Assessment Note    Patient Details  Name: Jonathan Ibarra MRN: [id number] Date of Birth: December 07, 1965  Transition of Care [facility]) CM/SW Contact:    Bridget Cordella Simmonds, LCSW Phone Number: 12/02/2023, 1:22 PM  Clinical Narrative:       CSW spoke with pt regarding PT recommendation for SNF. Pt from [facility], has been LTC pt at that facility.  He does plan to return.  Pt asking for any assistance with disability, was denied in the past, does not have current application pending.  Permission given to send referral to [facility].  Permission given to speak with friend Powell, but reports her number has changed and he does not have working number.   Referral sent to [facility].              Expected Discharge Plan: Skilled Nursing Facility Barriers to Discharge: Continued Medical Work up   Patient Goals and CMS Choice Patient states their goals for this hospitalization and ongoing recovery are:: walk again   Choice offered to / list presented to : Patient (pt [place] return [place]      Expected Discharge Plan and Services In-house Referral: Clinical Social Work   Post Acute Care Choice: Skilled Nursing Facility Living arrangements for the past 2 months: Skilled Nursing Facility                                      Prior Living Arrangements/Services Living arrangements for the past 2 months: Skilled Nursing Facility Lives with:: Facility Resident Patient language and need for interpreter reviewed:: Yes Do you feel safe going back to the place where you live?: Yes      Need for Family Participation in Patient Care: Yes (Comment) Care giver support system in place?: Yes (comment) Current home services: Other (comment) (na) Criminal Activity/Legal Involvement Pertinent to Current Situation/Hospitalization: No - Comment as needed  Activities of Daily Living   ADL Screening (condition at time of  admission) Independently performs ADLs?: No Is the patient deaf or have difficulty hearing?: No Does the patient have difficulty seeing, even when wearing glasses/contacts?: No Does the patient have difficulty concentrating, remembering, or making decisions?: Yes (Has short term memory issues per patient)  Permission Sought/Granted Permission sought to share information with : Family Supports Permission granted to share information with : Yes, Verbal Permission Granted  Share Information with NAME: [name], friend-reports he does not have working number for her  Permission granted to share info w AGENCY: SNF        Emotional Assessment Appearance:: Appears stated age Attitude/Demeanor/Rapport: Engaged Affect (typically observed): Appropriate, Pleasant Orientation: : Oriented to Self, Oriented to Place, Oriented to  Time, Oriented to Situation      Admission diagnosis:  Chronic osteomyelitis of right tibia [facility]) [F13.338] Patient Active Problem List   Diagnosis Date Noted   Chronic osteomyelitis of right tibia (HCC) 11/30/2023   Osteomyelitis (HCC) 06/30/2023   Prosthetic joint infection, subsequent encounter 06/27/2023   Bacteremia 04/21/2023   MRSA bacteremia 04/17/2023   Effusion of right knee joint 04/17/2023   Septic joint of right knee joint (HCC) 04/16/2023   Atrial fibrillation, chronic (HCC) 04/16/2023   Wound infection complicating hardware, subsequent encounter 02/04/2023   Drainage from wound 02/04/2023   Knee effusion, right 12/28/2022   Septic arthritis of knee, right (HCC) 12/28/2022   Fall  from bridge, initial encounter 12/13/2022   Atrial fibrillation with rapid ventricular response (HCC) 10/01/2022   SIRS without infection with organ dysfunction (HCC) 10/01/2022   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   Transaminitis 02/07/2015   Polysubstance abuse (HCC) 02/06/2015   Hypokalemia 02/06/2015   Hepatic encephalopathy (HCC)  02/04/2015   Thrombocytopenia 02/04/2015   Essential hypertension 02/04/2015   Hepatic cirrhosis (HCC) 02/04/2015   PCP:  Freddrick, No Pharmacy:   [facility] - [place], [place] - [address] [place] [address] Phone: [phone] Fax: [phone]     Social Drivers of Health (SDOH) Social History: SDOH Screenings   Food Insecurity: No Food Insecurity (12/01/2023)  Recent Concern: Food Insecurity - Food Insecurity Present (10/07/2023)   Received from [facility]  Housing: High Risk (12/01/2023)  Transportation Needs: Unmet Transportation Needs (12/01/2023)  Utilities: Low Risk  (11/04/2023)   Received from [facility]  Recent Concern: Utilities - At Risk (10/07/2023)   Received from [facility]  Financial Resource Strain: High Risk (09/05/2023)   Received from [facility]  Social Connections: Unknown (07/05/2021)   Received from [facility]  Stress: No Stress Concern Present (10/07/2023)   Received from [facility]  Tobacco Use: High Risk (11/30/2023)   SDOH Interventions:     Readmission Risk Interventions     No data to display

## 2023-12-03 LAB — BASIC METABOLIC PANEL WITH GFR
Anion gap: 3 — ABNORMAL LOW (ref 5–15)
BUN: 18 mg/dL (ref 6–20)
CO2: 29 mmol/L (ref 22–32)
Calcium: 8.5 mg/dL — ABNORMAL LOW (ref 8.9–10.3)
Chloride: 107 mmol/L (ref 98–111)
Creatinine, Ser: 0.71 mg/dL (ref 0.61–1.24)
GFR, Estimated: >60 mL/min (ref >60)
Glucose, Bld: 94 mg/dL (ref 70–99)
Potassium: 4.7 mmol/L (ref 3.5–5.1)
Sodium: 139 mmol/L (ref 135–145)

## 2023-12-03 LAB — CBC
HCT: 29.9 % — ABNORMAL LOW (ref 39.0–52.0)
Hemoglobin: 9.5 g/dL — ABNORMAL LOW (ref 13.0–17.0)
MCH: 27.1 pg (ref 26.0–34.0)
MCHC: 31.8 g/dL (ref 30.0–36.0)
MCV: 85.4 fL (ref 80.0–100.0)
Platelets: 282 K/uL (ref 150–400)
RBC: 3.5 MIL/uL — ABNORMAL LOW (ref 4.22–5.81)
RDW: 17.2 % — ABNORMAL HIGH (ref 11.5–15.5)
WBC: 4.2 K/uL (ref 4.0–10.5)
nRBC: 0 % (ref 0.0–0.2)

## 2023-12-03 NOTE — Progress Notes (Signed)
     3 Days Post-Op Procedure(s) (LRB): AMPUTATION, ABOVE KNEE (Right) Subjective:  Patient reports pain as mild to moderate.  Slept okay.  Mobilized 162ft with PT yesterday, almost fell but caught self.  Denies chest pain, ShOB, N/V.  Denies numbness or tingling.  No other complaints at this time.  Requesting discharge on Monday.  Objective:   VITALS:   Vitals:   12/02/23 1514 12/02/23 1942 12/03/23 0420 12/03/23 0809  BP: 115/73 101/75 (!) 92/58 109/67  Pulse: 81 76 77 74  Resp: 16 18 18 19   Temp: (!) 97.5 F (36.4 C) 97.6 F (36.4 C) 98.1 F (36.7 C) 98.1 F (36.7 C)  TempSrc: Oral Oral Oral Oral  SpO2: 100% 98% 96% 98%  Weight:      Height:        Operative Extremity (RLE): Stump shrinker in place.  Incisional wound VAC with good seal and functioning, no output in canister.    Lab Results  Component Value Date   WBC 4.2 12/03/2023   HGB 9.5 (L) 12/03/2023   HCT 29.9 (L) 12/03/2023   MCV 85.4 12/03/2023   PLT 282 12/03/2023   BMET    Component Value Date/Time   NA 139 12/03/2023 0939   K 4.7 12/03/2023 0939   CL 107 12/03/2023 0939   CO2 29 12/03/2023 0939   GLUCOSE 94 12/03/2023 0939   BUN 18 12/03/2023 0939   CREATININE 0.71 12/03/2023 0939   CALCIUM  8.5 (L) 12/03/2023 0939   GFRNONAA >60 12/03/2023 0939    Yesterday's total administered Morphine  Milligram Equivalents: 127.5   Assessment/Plan: 3 Days Post-Op   Principal Problem:   Chronic osteomyelitis of right tibia (HCC)  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  Plan will be for patient to return to SNF at discharge. Tentatively plan for Monday    D/C recommendations: - Oxycodone , Robaxin , gabapentin  for pain control - Aspirin  325 mg daily x 30 days for DVT prophylaxis   Follow - up plan: 1 week after d/c for wound check and wound VAC removal     Contact information:  Franky Light MD, Lauraine Moores PA-C. After hours and holidays please check [email or web address] for group call information for [facility]   Bernarda CHRISTELLA Mclean 12/03/2023, 1:00 PM   Contact information:   Weekdays 7am-5pm epic message Dr. Edna, or call office for patient follow up: [phone] After hours and holidays please check [email or web address] for group call information for [facility]

## 2023-12-04 NOTE — Plan of Care (Signed)

## 2023-12-04 NOTE — Plan of Care (Signed)
  Problem: Clinical Measurements: Goal: Ability to maintain clinical measurements within normal limits will improve Outcome: Progressing Goal: Diagnostic test results will improve Outcome: Progressing Goal: Respiratory complications will improve Outcome: Progressing   

## 2023-12-04 NOTE — Progress Notes (Signed)
     4 Days Post-Op Procedure(s) (LRB): AMPUTATION, ABOVE KNEE (Right) Subjective:  Patient reports pain as mild to moderate, about to receive some pain medicine.  No changes since yesterday.  Slept okay, no overnight events.  To move again with PT today.  Denies chest pain, ShOB, N/V.  Denies numbness or tingling.  No other complaints at this time.  Requesting discharge on Monday.  Objective:   VITALS:   Vitals:   12/03/23 1626 12/03/23 2005 12/04/23 0349 12/04/23 0808  BP: 124/71 114/66 111/63 112/64  Pulse: 75 80 68 70  Resp: 18 18 18    Temp: 98 F (36.7 C) 97.7 F (36.5 C) 98.6 F (37 C) 98.7 F (37.1 C)  TempSrc: Oral Oral Oral Oral  SpO2: 98% 96% 97% 98%  Weight:      Height:        Operative Extremity (RLE): Stump shrinker in place.  Incisional wound VAC with good seal and functioning, no output in canister.    Lab Results  Component Value Date   WBC 4.2 12/03/2023   HGB 9.5 (L) 12/03/2023   HCT 29.9 (L) 12/03/2023   MCV 85.4 12/03/2023   PLT 282 12/03/2023   BMET    Component Value Date/Time   NA 139 12/03/2023 0939   K 4.7 12/03/2023 0939   CL 107 12/03/2023 0939   CO2 29 12/03/2023 0939   GLUCOSE 94 12/03/2023 0939   BUN 18 12/03/2023 0939   CREATININE 0.71 12/03/2023 0939   CALCIUM  8.5 (L) 12/03/2023 0939   GFRNONAA >60 12/03/2023 0939    Yesterday's total administered Morphine  Milligram Equivalents: 62.5   Assessment/Plan: 4 Days Post-Op   Principal Problem:   Chronic osteomyelitis of right tibia (HCC)  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  Plan will be for patient to return to SNF at discharge. Tentatively plan for Monday    D/C recommendations: - Oxycodone , Robaxin , gabapentin  for pain control - Aspirin  325 mg daily x 30 days for DVT prophylaxis   Follow - up plan: 1 week after d/c for wound check and wound VAC removal     Contact information:  Franky Light MD, Lauraine Moores PA-C. After hours and holidays please check [email or web address] for group call information for [facility]   Bernarda CHRISTELLA Mclean 12/04/2023, 8:41 AM   Contact information:   Weekdays 7am-5pm epic message Dr. Edna, or call office for patient follow up: [phone] After hours and holidays please check [email or web address] for group call information for [facility]

## 2023-12-05 LAB — BASIC METABOLIC PANEL WITH GFR
Anion gap: 8 (ref 5–15)
BUN: 14 mg/dL (ref 6–20)
CO2: 28 mmol/L (ref 22–32)
Calcium: 8.2 mg/dL — ABNORMAL LOW (ref 8.9–10.3)
Chloride: 104 mmol/L (ref 98–111)
Creatinine, Ser: 0.69 mg/dL (ref 0.61–1.24)
GFR, Estimated: >60 mL/min (ref >60)
Glucose, Bld: 190 mg/dL — ABNORMAL HIGH (ref 70–99)
Potassium: 4.1 mmol/L (ref 3.5–5.1)
Sodium: 140 mmol/L (ref 135–145)

## 2023-12-05 LAB — CBC
HCT: 30.7 % — ABNORMAL LOW (ref 39.0–52.0)
Hemoglobin: 9.8 g/dL — ABNORMAL LOW (ref 13.0–17.0)
MCH: 27.2 pg (ref 26.0–34.0)
MCHC: 31.9 g/dL (ref 30.0–36.0)
MCV: 85.3 fL (ref 80.0–100.0)
Platelets: 222 K/uL (ref 150–400)
RBC: 3.6 MIL/uL — ABNORMAL LOW (ref 4.22–5.81)
RDW: 17.2 % — ABNORMAL HIGH (ref 11.5–15.5)
WBC: 5.1 K/uL (ref 4.0–10.5)
nRBC: 0 % (ref 0.0–0.2)

## 2023-12-05 NOTE — Plan of Care (Addendum)
 Changed wound vac over to portable. Patient requesting PT session prior to discharge.   Problem: Education: Goal: Knowledge of General Education information will improve Description: Including pain rating scale, medication(s)/side effects and non-pharmacologic comfort measures Outcome: Progressing   Problem: Activity: Goal: Risk for activity intolerance will decrease Outcome: Progressing   Problem: Pain Managment: Goal: General experience of comfort will improve and/or be controlled Outcome: Progressing   Problem: Safety: Goal: Ability to remain free from injury will improve Outcome: Progressing   Problem: Skin Integrity: Goal: Risk for impaired skin integrity will decrease Outcome: Progressing

## 2023-12-05 NOTE — Plan of Care (Signed)

## 2023-12-05 NOTE — Progress Notes (Cosign Needed Addendum)
 Orthopaedic Trauma Progress Note  SUBJECTIVE: Doing fair today, affect seems flat. Patient states he just doesn't feel good but can't elaborate on that. Pain is unchanged, fairly well managed on current regimen. No chest pain. No SOB. No nausea/vomiting. No other complaints.  No BM over the weekend.  Tolerating diet and fluids.   OBJECTIVE:  Vitals:   12/05/23 0534 12/05/23 0829  BP: 111/74 126/74  Pulse: 73 72  Resp: 16 17  Temp: 97.9 F (36.6 C) 97.7 F (36.5 C)  SpO2: 96% 96%    Opiates Today (MME): Today's  total administered Morphine  Milligram Equivalents: 30 Opiates Yesterday (MME): Yesterday's total administered Morphine  Milligram Equivalents: 122.5  General: Resting in bed. No acute distress.   Respiratory: No increased work of breathing.  Operative Extremity (RLE): Stump shrinker in place.  Incisional wound VAC with good seal and functioning, no output in canister.  LABS:  Results for orders placed or performed during the hospital encounter of 11/30/23 (from the past 24 hours)  CBC     Status: Abnormal   Collection Time: 12/05/23  2:25 PM  Result Value Ref Range   WBC 5.1 4.0 - 10.5 K/uL   RBC 3.60 (L) 4.22 - 5.81 MIL/uL   Hemoglobin 9.8 (L) 13.0 - 17.0 g/dL   HCT 69.2 (L) 60.9 - 47.9 %   MCV 85.3 80.0 - 100.0 fL   MCH 27.2 26.0 - 34.0 pg   MCHC 31.9 30.0 - 36.0 g/dL   RDW 82.7 (H) 88.4 - 84.4 %   Platelets 222 150 - 400 K/uL   nRBC 0.0 0.0 - 0.2 %    ASSESSMENT: Jonathan Ibarra is a 58 y.o. male, 5 Days Post-Op RIGHT ABOVE-KNEE AMPUTATION FOR CHRONIC OSTEOMYELITIS  CV/Blood loss: Acute blood loss anemia, Hgb 9.8 this AM. Hemodynamically stable  PLAN: Weightbearing: NWB RLE ROM: Unrestricted Incisional and dressing care: Reinforce dressings as needed  Showering: Hold off on showering for now Orthopedic device(s): Incisional wound VAC to RLE x 1 week Pain management:  Robaxin  3 times daily  Oxycodone  q 4 hours PRN Dilaudid  q 3 hours PRN Valium q 8  hours PRN Cymbalta daily Neurontin  3 times daily VTE prophylaxis: Aspirin , SCD LLE ID:  Ancef  2gm post op completed Foley/Lines:  No foley, KVO IVFs Dispo: PT/OT evaluation ongoing.  Plan will be for patient to return to SNF at discharge. Tentatively plan for Tuesday  D/C recommendations: - Oxycodone , Robaxin , gabapentin  for pain control - Aspirin  325 mg daily x 30 days for DVT prophylaxis  Follow - up plan: 12/13/23 at 2:15PM for wound check and wound VAC removal   Contact information:  Franky Light MD, Lauraine Moores PA-C. After hours and holidays please check [email or web address] for group call information for [facility]   Lauraine PATRIC Moores, PA-C [phone] (office) [email or web address]

## 2023-12-05 NOTE — Progress Notes (Signed)
 Physical Therapy Treatment Patient Details Name: Jonathan Ibarra MRN: [id number] DOB: Apr 06, 1965 Today's Date: 12/05/2023   History of Present Illness Pt is a 58 y.o. male admitted on 11/29/23 for R AKA after fall with R tibial plateau fracture in October 2024 and multiple falls and infections since. PMH: cirrhosis, afib, HTN, SVT, polysubstance abuse.    PT Comments  Pt lethargic but arousable upon PT entry. Pt tolerated amputee exercises in supine. Tends to keep RLE in abduction, educated on trying to maintain midline positioning in bed. Pt able to mobilize to and from EOB independently. Educated pt on different types of transfers and when to use them. Began with level lateral scoot transfer to w/c on pt's L and had him teach back set up. He was able to scoot into w/c with min A for safety. However, once in chair he began to seem anxious with decreased verbalization, stating only that he did't feel well but could not give more description. He reported that he didn't feel ready to go to SNF yet. Was agreeable to standing with RW and hopped a few steps but then he did not feel that he could keep going and needed to lie back down. BP 153/92, HR 68 bpm. RN notified of pt status. Patient will benefit from continued inpatient follow up therapy, <3 hours/day but would benefit from one more day of acute PT before leaving, given limitations today.     If plan is discharge home, [name];[name];Assistance with cooking/housework;Direct supervision/assist for medications management;Assist for transportation;Help with stairs or ramp for entrance;Supervision due to cognitive status   Can travel by private vehicle     No  Equipment Recommendations  None recommended by PT    Recommendations for Other Services       Precautions / Restrictions Precautions Precautions: Fall Recall of Precautions/Restrictions:  Intact Restrictions Weight Bearing Restrictions Per Provider Order: Yes RLE Weight Bearing Per Provider Order: Non weight bearing Other Position/Activity Restrictions: R LE wound vac     Mobility  Bed Mobility Overal bed mobility: Modified Independent Bed Mobility: Supine to Sit, Sit to Supine     Supine to sit: Modified independent (Device/Increase time) Sit to supine: Modified independent (Device/Increase time)   General bed mobility comments: able to come to EOB from flat bed and return to supine    Transfers Overall transfer level: Needs assistance Equipment used: Rolling walker (2 wheels) Transfers: Sit to/from Stand, Bed to chair/wheelchair/BSC Sit to Stand: Min assist, +2 physical assistance   Step pivot transfers: Min assist, +2 safety/equipment      Lateral/Scoot Transfers: Min assist, +2 safety/equipment General transfer comment: practiced lateral scoot transfer into w/c on pt's L with min A for safety and +2 to stabilize w/c. DIscussed that his will be pt's safest transfer. Educated on w/c placement and setup for transfer and having pt teach back. Once pt was in w/c he became less verbal and seemingly anxious with hands shaking and wringing.  Min A +2 for STS from w/c and pt took several hops fwd and to turn back to bed but deferred going further stating that he just didn't feel well.    Ambulation/Gait       Gait Pattern/deviations:  (Hop to)       General Gait Details: deferred today due to pt feeling unwell   Optometrist  Tilt Bed    Modified Rankin (Stroke Patients Only)       Balance Overall balance assessment: Needs assistance Sitting-balance support: Bilateral upper extremity supported Sitting balance-Leahy Scale: Good     Standing balance support: Bilateral upper extremity supported, During functional activity Standing balance-Leahy Scale: Poor Standing balance comment: reliant on RW                             Communication Communication Communication: Other (comment) (decreased verbalization)  Cognition Arousal: Lethargic Behavior During Therapy: Flat affect, Anxious   PT - Cognitive impairments: Awareness, Safety/Judgement                       PT - Cognition Comments: pt with a lot of anxiety today and minimal verbalization, needed commands to be given multiple times, kept saying he didn't feel good but could not verbalize why or give details of what didn't feel good. Following commands: Intact      Cueing Cueing Techniques: Verbal cues  Exercises Amputee Exercises Quad Sets: AROM, Right, 5 reps, Supine (c/o thigh pain with these) Hip Extension: AROM, Right, 10 reps, Supine Hip ABduction/ADduction: AROM, Right, 10 reps, Supine    General Comments General comments (skin integrity, edema, etc.): BP in sitting 153/92, HR 68 bpm. Pt tends to keep RLE in abduction in bed. Discussed midline positioning to prevent abductor tightness      Pertinent Vitals/Pain Pain Assessment Pain Assessment: Faces Faces Pain Scale: Hurts little more Pain Location: R LE Pain Descriptors / Indicators: Grimacing, Guarding Pain Intervention(s): Limited activity within patient's tolerance, Monitored during session, Premedicated before session    Home Living                          Prior Function            PT Goals (current goals can now be found in the care plan section) Acute Rehab PT Goals Patient Stated Goal: to get back to walking PT Goal Formulation: With patient Time For Goal Achievement: 12/15/23 Potential to Achieve Goals: Good Progress towards PT goals: Not progressing toward goals - comment (feeling unwell)    Frequency    Min 3X/week      PT Plan      Co-evaluation              AM-PAC PT 6 Clicks Mobility   Outcome Measure  Help needed turning from your back to your side while in a flat bed without using bedrails?: A  Little Help needed moving from lying on your back to sitting on the side of a flat bed without using bedrails?: A Little Help needed moving to and from a bed to a chair (including a wheelchair)?: A Little Help needed standing up from a chair using your arms (e.g., wheelchair or bedside chair)?: A Little Help needed to walk in hospital room?: A Lot Help needed climbing 3-5 steps with a railing? : Total 6 Click Score: 15    End of Session Equipment Utilized During Treatment: Gait belt Activity Tolerance: Other (comment) (limited by anxiety) Patient left: with call bell/phone within reach;in bed;with bed alarm set Nurse Communication: Mobility status PT Visit Diagnosis: Other abnormalities of gait and mobility (R26.89)     Time: 9141-9066 PT Time Calculation (min) (ACUTE ONLY): 35 min  Charges:    $Therapeutic Exercise: 8-22 mins $Therapeutic Activity: 8-22 mins PT General  Charges $$ ACUTE PT VISIT: 1 Visit                     Richerd Lipoma, PT  Acute Rehab Services Secure chat preferred Office [phone]    Richerd LITTIE Lipoma 12/05/2023, 12:37 PM

## 2023-12-05 NOTE — Discharge Summary (Signed)
 Orthopaedic Trauma Service (OTS) Discharge Summary   [name] MRN: [id number] DOB/AGE: 08-Jan-1966 58 y.o.  Admit date: 11/30/2023 Discharge date: 12/06/2023  Admission Diagnoses: Right proximal tibia chronic osteomyelitis  Discharge Diagnoses:  Principal Problem:   Chronic osteomyelitis of right tibia [facility])   Past Medical History:  Diagnosis Date   Alcohol abuse    Anemia    transfusion 08/04/23 in [facility]   Cirrhosis of liver (HCC)    Constipation    GERD (gastroesophageal reflux disease)    H/O ETOH abuse    Hepatitis    Hep C w/o coma, chronic - treated   Hypertension    Nicotine  dependence    PAF (paroxysmal atrial fibrillation) (HCC) 12/28/2022   Pyogenic arthritis (HCC)    leg right   Substance abuse (HCC) 11/01/2023   Polysubstance abuse - admitted in [facility]   SVT (supraventricular tachycardia) 09/2022     Procedures Performed: Right above-knee amputation  Discharged Condition: good/stable  Hospital Course: Patient presented to [facility] on 11/30/2023 for scheduled procedure on right lower extremity.  He was taken to the operating room by Dr. Kendal for the above procedure.  He tolerated this well without complications.  Incisional wound VAC was placed over the incision to control wound drainage postoperatively.  Patient was admitted to the orthopedic service for pain control and therapies.  He was instructed to be nonweightbearing on the right lower extremity.  Patient was started on aspirin  for DVT prophylaxis starting on postoperative day #1.  Patient began working with physical and Occupational Therapy starting on postoperative day #1.  The remainder of patient's hospitalization was dedicated to achieving adequate pain control and increasing mobility in order for patient to safely return to his rehab facility. On 12/06/2023, the patient was tolerating diet, working well with therapies, pain well controlled, vital signs stable,  dressings clean, dry, intact and felt stable for discharge to SNF. Patient will follow up as below and knows to call with questions or concerns.     Consults: None  Significant Diagnostic Studies:   Results for orders placed or performed during the hospital encounter of 11/30/23 (from the past week)  Surgical pcr screen   Collection Time: 11/30/23  7:14 AM   Specimen: Nasal Mucosa; Nasal Swab  Result Value Ref Range   MRSA, PCR NEGATIVE NEGATIVE   Staphylococcus aureus NEGATIVE NEGATIVE  Surgical pathology   Collection Time: 11/30/23  9:04 AM  Result Value Ref Range   SURGICAL PATHOLOGY      SURGICAL PATHOLOGY CASE: [phone] PATIENT: Jonathan Ibarra Surgical Pathology Report     Clinical History: chronic osteomyelitis (cm)     FINAL MICROSCOPIC DIAGNOSIS:  A. LEG, RIGHT ABOVE KNEE, AMPUTATION: - Soft tissue necrosis with underlying acute osteomyelitis. - Skin with scar. - Atherosclerosis.  GROSS DESCRIPTION:  Received fresh is a 63 cm long by up to 16 cm diameter above-the-knee amputation specimen with attached 27 cm long by 11 cm wide right foot. The foot has 5 intact digits with thickened yellow nails.  The foot is remarkable for pitting edema.  The tan skin is remarkable for a 21 cm long by up to 5.5 cm wide predominantly well-healed scar at the knee with 4.5 x 2.0 cm area of edema and red discoloration.  Sectioning through the discoloration reveals underlying thick tan-yellow purulent fluid.  Sectioning through the vessels reveals no definitive atherosclerotic plaque or calcification. Representative sections are submitted as follows: A1,  skin and soft tissue  from the resection margin.  A2, skin scar with discoloration at knee.  A3, representative bone underlying the scar and purulent fluid (post decal).  A4, cross-section of femoral vessels at margin.  A5, cross-section of anterior and posterior tibial vessels. (WC 12/01/2023)  Final Diagnosis performed by  Rexene Daily, MD.   Electronically signed 12/02/2023 Technical component performed at [facility]. [facility], [address], [place], [place] [address].  Professional component performed at [facility]. [address] [place], [place] [address]  Immunohistochemistry Technical component (if applicable) was performed at [employer]. [address], [place], [place] [address].  IMMUNOHISTOCHEMISTRY DISCLAIMER (if applicable): Some of these immunohistochemical stains may have been developed and the performance characteristics determine by [facility]. Some may not have been clea red or approved by the U.S. Food and Drug Administration. The FDA has determined that such clearance or approval is not necessary. This test is used for clinical purposes. It should not be regarded as investigational or for research. This laboratory is certified under the Clinical Laboratory Improvement Amendments of 1988 (CLIA-88) as qualified to perform high complexity clinical laboratory testing.  The controls stained appropriately.   Basic metabolic panel   Collection Time: 12/01/23  4:03 AM  Result Value Ref Range   Sodium 132 (L) 135 - 145 mmol/L   Potassium 4.6 3.5 - 5.1 mmol/L   Chloride 104 98 - 111 mmol/L   CO2 22 22 - 32 mmol/L   Glucose, Bld 148 (H) 70 - 99 mg/dL   BUN 23 (H) 6 - 20 mg/dL   Creatinine, Ser 9.15 0.61 - 1.24 mg/dL   Calcium  8.4 (L) 8.9 - 10.3 mg/dL   GFR, Estimated >39 >39 mL/min   Anion gap 6 5 - 15  CBC   Collection Time: 12/01/23  4:03 AM  Result Value Ref Range   WBC 8.9 4.0 - 10.5 K/uL   RBC 3.70 (L) 4.22 - 5.81 MIL/uL   Hemoglobin 10.0 (L) 13.0 - 17.0 g/dL   HCT 69.6 (L) 60.9 - 47.9 %   MCV 81.9 80.0 - 100.0 fL   MCH 27.0 26.0 - 34.0 pg   MCHC 33.0 30.0 - 36.0 g/dL   RDW 83.1 (H) 88.4 - 84.4 %   Platelets 305 150 - 400 K/uL   nRBC 0.0 0.0 - 0.2 %  CBC   Collection Time: 12/02/23  3:56 AM   Result Value Ref Range   WBC 6.0 4.0 - 10.5 K/uL   RBC 3.47 (L) 4.22 - 5.81 MIL/uL   Hemoglobin 9.4 (L) 13.0 - 17.0 g/dL   HCT 70.8 (L) 60.9 - 47.9 %   MCV 83.9 80.0 - 100.0 fL   MCH 27.1 26.0 - 34.0 pg   MCHC 32.3 30.0 - 36.0 g/dL   RDW 82.5 (H) 88.4 - 84.4 %   Platelets 286 150 - 400 K/uL   nRBC 0.0 0.0 - 0.2 %  CBC   Collection Time: 12/03/23  9:39 AM  Result Value Ref Range   WBC 4.2 4.0 - 10.5 K/uL   RBC 3.50 (L) 4.22 - 5.81 MIL/uL   Hemoglobin 9.5 (L) 13.0 - 17.0 g/dL   HCT 70.0 (L) 60.9 - 47.9 %   MCV 85.4 80.0 - 100.0 fL   MCH 27.1 26.0 - 34.0 pg   MCHC 31.8 30.0 - 36.0 g/dL   RDW 82.7 (H) 88.4 - 84.4 %   Platelets 282 150 - 400 K/uL  nRBC 0.0 0.0 - 0.2 %  Basic metabolic panel   Collection Time: 12/03/23  9:39 AM  Result Value Ref Range   Sodium 139 135 - 145 mmol/L   Potassium 4.7 3.5 - 5.1 mmol/L   Chloride 107 98 - 111 mmol/L   CO2 29 22 - 32 mmol/L   Glucose, Bld 94 70 - 99 mg/dL   BUN 18 6 - 20 mg/dL   Creatinine, Ser 9.28 0.61 - 1.24 mg/dL   Calcium  8.5 (L) 8.9 - 10.3 mg/dL   GFR, Estimated >39 >39 mL/min   Anion gap 3 (L) 5 - 15  CBC   Collection Time: 12/05/23  2:25 PM  Result Value Ref Range   WBC 5.1 4.0 - 10.5 K/uL   RBC 3.60 (L) 4.22 - 5.81 MIL/uL   Hemoglobin 9.8 (L) 13.0 - 17.0 g/dL   HCT 69.2 (L) 60.9 - 47.9 %   MCV 85.3 80.0 - 100.0 fL   MCH 27.2 26.0 - 34.0 pg   MCHC 31.9 30.0 - 36.0 g/dL   RDW 82.7 (H) 88.4 - 84.4 %   Platelets 222 150 - 400 K/uL   nRBC 0.0 0.0 - 0.2 %  Basic metabolic panel   Collection Time: 12/05/23  2:25 PM  Result Value Ref Range   Sodium 140 135 - 145 mmol/L   Potassium 4.1 3.5 - 5.1 mmol/L   Chloride 104 98 - 111 mmol/L   CO2 28 22 - 32 mmol/L   Glucose, Bld 190 (H) 70 - 99 mg/dL   BUN 14 6 - 20 mg/dL   Creatinine, Ser 9.30 0.61 - 1.24 mg/dL   Calcium  8.2 (L) 8.9 - 10.3 mg/dL   GFR, Estimated >39 >39 mL/min   Anion gap 8 5 - 15     Treatments: IV hydration, antibiotics: Ancef , analgesia:  acetaminophen , Dilaudid , oxycodone , Toradol , and Robaxin , cardiac meds: furosemide , metoprolol , anticoagulation: ASA, therapies: PT and OT, and surgery: As above  Discharge Exam: General: Sitting up in bed, no acute distress Respiratory: No increased work of breathing at rest Operative Extremity (RLE): Stump shrinker in place. Incisional wound VAC with good seal and functioning, no output in canister.   Disposition: Discharge disposition: 03-Skilled Nursing Facility        Discharge Instructions     Call MD / Call 911   Complete by: As directed    If you experience chest pain or shortness of breath, CALL 911 and be transported to the hospital emergency room.  If you develope a fever above 101 F, pus (white drainage) or increased drainage or redness at the wound, or calf pain, call your surgeon's office.   Call MD / Call 911   Complete by: As directed    If you experience chest pain or shortness of breath, CALL 911 and be transported to the hospital emergency room.  If you develope a fever above 101 F, pus (white drainage) or increased drainage or redness at the wound, or calf pain, call your surgeon's office.   Constipation Prevention   Complete by: As directed    Drink plenty of fluids.  Prune juice may be helpful.  You may use a stool softener, such as Colace (over the counter) 100 mg twice a day.  Use MiraLax  (over the counter) for constipation as needed.   Constipation Prevention   Complete by: As directed    Drink plenty of fluids.  Prune juice may be helpful.  You may use a stool softener, such as  Colace (over the counter) 100 mg twice a day.  Use MiraLax  (over the counter) for constipation as needed.   Diet - low sodium heart healthy   Complete by: As directed    Increase activity slowly as tolerated   Complete by: As directed    Increase activity slowly as tolerated   Complete by: As directed    Post-operative opioid taper instructions:   Complete by: As directed     POST-OPERATIVE OPIOID TAPER INSTRUCTIONS: It is important to wean off of your opioid medication as soon as possible. If you do not need pain medication after your surgery it is ok to stop day one. Opioids include: Codeine, Hydrocodone(Norco, Vicodin), Oxycodone (Percocet, oxycontin ) and hydromorphone  amongst others.  Long term and even short term use of opiods can cause: Increased pain response Dependence Constipation Depression Respiratory depression And more.  Withdrawal symptoms can include Flu like symptoms Nausea, vomiting And more Techniques to manage these symptoms Hydrate well Eat regular healthy meals Stay active Use relaxation techniques(deep breathing, meditating, yoga) Do Not substitute Alcohol to help with tapering If you have been on opioids for less than two weeks and do not have pain than it is ok to stop all together.  Plan to wean off of opioids This plan should start within one week post op of your joint replacement. Maintain the same interval or time between taking each dose and first decrease the dose.  Cut the total daily intake of opioids by one tablet each day Next start to increase the time between doses. The last dose that should be eliminated is the evening dose.      Post-operative opioid taper instructions:   Complete by: As directed    POST-OPERATIVE OPIOID TAPER INSTRUCTIONS: It is important to wean off of your opioid medication as soon as possible. If you do not need pain medication after your surgery it is ok to stop day one. Opioids include: Codeine, Hydrocodone(Norco, Vicodin), Oxycodone (Percocet, oxycontin ) and hydromorphone  amongst others.  Long term and even short term use of opiods can cause: Increased pain response Dependence Constipation Depression Respiratory depression And more.  Withdrawal symptoms can include Flu like symptoms Nausea, vomiting And more Techniques to manage these symptoms Hydrate well Eat regular healthy  meals Stay active Use relaxation techniques(deep breathing, meditating, yoga) Do Not substitute Alcohol to help with tapering If you have been on opioids for less than two weeks and do not have pain than it is ok to stop all together.  Plan to wean off of opioids This plan should start within one week post op of your joint replacement. Maintain the same interval or time between taking each dose and first decrease the dose.  Cut the total daily intake of opioids by one tablet each day Next start to increase the time between doses. The last dose that should be eliminated is the evening dose.         Allergies as of 12/06/2023       Reactions   Tylenol  [acetaminophen ] Other (See Comments)   Impacts liver        Medication List     STOP taking these medications    chlorhexidine  4 % external liquid Commonly known as: HIBICLENS    diltiazem  180 MG 24 hr capsule Commonly known as: CARDIZEM  CD   doxycycline  100 MG tablet Commonly known as: ADOXA   ferrous sulfate 325 (65 FE) MG tablet   furosemide  40 MG tablet Commonly known as: LASIX    lamoTRIgine 25 MG tablet  Commonly known as: LAMICTAL   lidocaine  5 % Commonly known as: LIDODERM    mirtazapine 7.5 MG tablet Commonly known as: REMERON   pantoprazole  40 MG tablet Commonly known as: PROTONIX    thiamine  100 MG tablet Commonly known as: Vitamin B-1       TAKE these medications    acetaminophen  500 MG tablet Commonly known as: TYLENOL  Take 1,000 mg by mouth in the morning, at noon, and at bedtime.   ascorbic acid 1000 MG tablet Commonly known as: VITAMIN C Take 1 tablet (1,000 mg total) by mouth daily.   aspirin  325 MG tablet Take 1 tablet (325 mg total) by mouth daily.   diazepam 5 MG tablet Commonly known as: VALIUM Take 1 tablet (5 mg total) by mouth every 8 (eight) hours as needed for anxiety.   docusate sodium  100 MG capsule Commonly known as: COLACE Take 1 capsule (100 mg total) by mouth 2  (two) times daily.   gabapentin  800 MG tablet Commonly known as: NEURONTIN  Take 800 mg by mouth 3 (three) times daily.   lactulose  (encephalopathy) 10 GM/15ML Soln Commonly known as: CHRONULAC  Take 60 g by mouth 3 (three) times daily.   Melatonin 10 MG Tabs Take 1 tablet by mouth at bedtime as needed. What changed:  when to take this reasons to take this   Methocarbamol  1000 MG Tabs Take 1,000 mg by mouth 3 (three) times daily. What changed:  medication strength how much to take when to take this reasons to take this   metoprolol  succinate 50 MG 24 hr tablet Commonly known as: TOPROL -XL Take 1 tablet (50 mg total) by mouth daily. Take with or immediately following a meal.   oxyCODONE  15 MG immediate release tablet Commonly known as: ROXICODONE  Take 1 tablet (15 mg total) by mouth every 4 (four) hours as needed for moderate pain (pain score 4-6) or severe pain (pain score 7-10). What changed:  reasons to take this Another medication with the same name was removed. Continue taking this medication, and follow the directions you see here.   polyethylene glycol 17 g packet Commonly known as: MIRALAX  / GLYCOLAX  Take 17 g by mouth daily as needed for mild constipation.   spironolactone  12.5 mg Tabs tablet Commonly known as: ALDACTONE  Take 12.5 mg by mouth in the morning.        Follow-up Information     Haddix, Franky SQUIBB, MD. Go on 12/13/2023.   Specialty: Orthopedic Surgery Why: 12/13/23 at 2:15pm for wound check and repeat x-rays Contact information: [address] [place] [address] [phone]                 Discharge Instructions and Plan: Patient will be discharged back to his SNF. Will be discharged on Aspirin  for DVT prophylaxis. Patient has been provided with all the necessary DME for discharge. Patient will follow up with Dr. Kendal in 1 week for wound check and wound VAC removal  Signed:  Lauraine PATRIC Moores, PA-C ?[phone]?  (phone) 12/06/2023, 8:38 AM  [facility] [address] [place] [address] [phone] MAXIMINO MILLING (F)

## 2023-12-05 NOTE — TOC Progression Note (Signed)
 Transition of Care [facility]) - Progression Note    Patient Details  Name: Jonathan Ibarra MRN: [id number] Date of Birth: May 05, 1965  [facility]) CM/SW Contact  Bridget Cordella Simmonds, LCSW Phone Number: 12/05/2023, 11:58 AM  Clinical Narrative:   CSW confirmed with [facility] that they can receive pt today.  PA informed.   1130: DC cancelled.  SNF updated.  Financial counseling will review pt today for eligibility for disability assistance.     Expected Discharge Plan: Skilled Nursing Facility Barriers to Discharge: Continued Medical Work up               Expected Discharge Plan and Services In-house Referral: Clinical Social Work   Post Acute Care Choice: Skilled Nursing Facility Living arrangements for the past 2 months: Skilled Nursing Facility Expected Discharge Date: 12/05/23                                     Social Drivers of Health (SDOH) Interventions SDOH Screenings   Food Insecurity: No Food Insecurity (12/01/2023)  Recent Concern: Food Insecurity - Food Insecurity Present (10/07/2023)   Received from [facility]  Housing: High Risk (12/01/2023)  Transportation Needs: Unmet Transportation Needs (12/01/2023)  Utilities: Low Risk  (11/04/2023)   Received from [facility]  Recent Concern: Utilities - At Risk (10/07/2023)   Received from [facility]  Financial Resource Strain: High Risk (09/05/2023)   Received from [facility]  Social Connections: Unknown (07/05/2021)   Received from [facility]  Stress: No Stress Concern Present (10/07/2023)   Received from [facility]  Tobacco Use: High Risk (11/30/2023)    Readmission Risk Interventions     No data to display

## 2023-12-06 NOTE — Progress Notes (Signed)
 Pt still showing SDOH needs for food, housing, transportation, utilities.  Pt currently long term resident at [facility] in [place], all of those needs are met by the facility.  CSW confirmed with pt that he plans to remain their long term.  No intervention needed.  Cathlyn Ferry, MSW, LCSW 10/7/202510:47 AM

## 2023-12-06 NOTE — Progress Notes (Signed)
 Occupational Therapy Treatment Patient Details Name: Jonathan Ibarra MRN: [id number] DOB: April 04, 1965 Today's Date: 12/06/2023   History of present illness Pt is a 58 y.o. male admitted on 11/29/23 for R AKA after fall with R tibial plateau fracture in October 2024 and multiple falls and infections since. PMH: cirrhosis, afib, HTN, SVT, polysubstance abuse.   OT comments  Pt progressing toward established OT goals. Requesting to use the restroom on arrival thus focus session on LB ADL, toileting, and activity tolerance. Pt needing max A for donning R sock; mod A for standing toileting due to need for set-up and assist for balance during toileting. Pt also needing min A +2 safety with functional mobility with RW. Handoff to PT at end of session. Patient will benefit from continued inpatient follow up therapy, <3 hours/day       If plan is discharge home, recommend the following:  A little help with walking and/or transfers;A lot of help with bathing/dressing/bathroom;Assistance with cooking/housework;Direct supervision/assist for medications management;Direct supervision/assist for financial management;Assist for transportation;Help with stairs or ramp for entrance   Equipment Recommendations  None recommended by OT    Recommendations for Other Services      Precautions / Restrictions Precautions Precautions: Fall Recall of Precautions/Restrictions: Intact Restrictions Weight Bearing Restrictions Per Provider Order: Yes RLE Weight Bearing Per Provider Order: Non weight bearing Other Position/Activity Restrictions: R LE wound vac       Mobility Bed Mobility Overal bed mobility: Modified Independent                  Transfers Overall transfer level: Needs assistance Equipment used: Rolling walker (2 wheels) Transfers: Sit to/from Stand Sit to Stand: Min assist, +2 physical assistance           General transfer comment: for rise     Balance Overall balance  assessment: Needs assistance Sitting-balance support: Bilateral upper extremity supported Sitting balance-Leahy Scale: Good     Standing balance support: Bilateral upper extremity supported, During functional activity Standing balance-Leahy Scale: Poor Standing balance comment: reliant on RW                           ADL either performed or assessed with clinical judgement   ADL Overall ADL's : Needs assistance/impaired     Grooming: Set up;Sitting               Lower Body Dressing: Maximal assistance;Sitting/lateral leans Lower Body Dressing Details (indicate cue type and reason): don R sock Toilet Transfer: Minimal assistance;Ambulation;Rolling walker (2 wheels);+2 for safety/equipment;Regular Toilet;Grab bars Toilet Transfer Details (indicate cue type and reason): ambulatory into restroom         Functional mobility during ADLs: Minimal assistance;Rolling walker (2 wheels);+2 for safety/equipment General ADL Comments: chair follow longer distances    Extremity/Trunk Assessment Upper Extremity Assessment Upper Extremity Assessment: Overall WFL for tasks assessed   Lower Extremity Assessment Lower Extremity Assessment: Defer to PT evaluation        Vision       Perception     Praxis     Communication Communication Communication: No apparent difficulties   Cognition Arousal: Alert Behavior During Therapy: Flat affect Cognition: Cognition impaired     Awareness: Online awareness impaired, Intellectual awareness intact Memory impairment (select all impairments): Short-term memory (decr carryover of RLE positioning and exercises)  Following commands: Intact        Cueing   Cueing Techniques: Verbal cues  Exercises      Shoulder Instructions       General Comments      Pertinent Vitals/ Pain       Pain Assessment Pain Assessment: Faces Faces Pain Scale: Hurts little more Pain Location: R LE Pain  Descriptors / Indicators: Grimacing, Guarding Pain Intervention(s): Limited activity within patient's tolerance, Monitored during session  Home Living                                          Prior Functioning/Environment              Frequency  Min 1X/week        Progress Toward Goals  OT Goals(current goals can now be found in the care plan section)  Progress towards OT goals: Progressing toward goals  Acute Rehab OT Goals OT Goal Formulation: With patient Time For Goal Achievement: 12/15/23 Potential to Achieve Goals: Good ADL Goals Pt Will Perform Lower Body Bathing: sitting/lateral leans;with set-up Pt Will Perform Lower Body Dressing: sitting/lateral leans;with set-up Pt Will Transfer to Toilet: ambulating;bedside commode;with supervision Pt Will Perform Toileting - Clothing Manipulation and hygiene: sitting/lateral leans;with set-up  Plan      Co-evaluation                 AM-PAC OT 6 Clicks Daily Activity     Outcome Measure   Help from another person eating meals?: None Help from another person taking care of personal grooming?: A Little Help from another person toileting, which includes using toliet, bedpan, or urinal?: A Little Help from another person bathing (including washing, rinsing, drying)?: A Lot Help from another person to put on and taking off regular upper body clothing?: A Little Help from another person to put on and taking off regular lower body clothing?: A Lot 6 Click Score: 17    End of Session Equipment Utilized During Treatment: Rolling walker (2 wheels);Other (comment) (RLE shrinker with wound vac threaded through)  OT Visit Diagnosis: Unsteadiness on feet (R26.81);Other abnormalities of gait and mobility (R26.89);Pain   Activity Tolerance Patient tolerated treatment well   Patient Left in chair (handoff to PT)   Nurse Communication Mobility status        Time: 8885-8865 OT Time Calculation  (min): 20 min  Charges: OT General Charges $OT Visit: 1 Visit OT Treatments $Self Care/Home Management : 8-22 mins  Jonathan Ibarra, OTR/L [facility] Acute Rehabilitation Office: [phone]   Jonathan Ibarra 12/06/2023, 12:03 PM

## 2023-12-06 NOTE — Progress Notes (Signed)
 Physical Therapy Treatment Patient Details Name: Jonathan Ibarra MRN: [id number] DOB: October 31, 1965 Today's Date: 12/06/2023   History of Present Illness Pt is a 58 y.o. male admitted on 11/29/23 for R AKA after fall with R tibial plateau fracture in October 2024 and multiple falls and infections since. PMH: cirrhosis, afib, HTN, SVT, polysubstance abuse.    PT Comments  Pt doing much better today than during session yesterday, much more alert and interactive, also showed motivation to ambulate. Practiced STS from multiple height surfaces and ambulated 150' with RW and min A with cues for self monitoring for fatigue. From a PT standpoint pt ready for d/c to SNF today.      If plan is discharge home, [name];[name];Assistance with cooking/housework;Direct supervision/assist for medications management;Assist for transportation;Help with stairs or ramp for entrance;Supervision due to cognitive status   Can travel by private vehicle     No  Equipment Recommendations  None recommended by PT    Recommendations for Other Services       Precautions / Restrictions Precautions Precautions: Fall Recall of Precautions/Restrictions: Intact Restrictions Weight Bearing Restrictions Per Provider Order: Yes RLE Weight Bearing Per Provider Order: Non weight bearing Other Position/Activity Restrictions: R LE wound vac     Mobility  Bed Mobility Overal bed mobility: Modified Independent Bed Mobility: Supine to Sit, Sit to Supine     Supine to sit: Modified independent (Device/Increase time) Sit to supine: Modified independent (Device/Increase time)   General bed mobility comments: able to come to EOB from flat bed and return to supine    Transfers Overall transfer level: Needs assistance Equipment used: Rolling walker (2 wheels) Transfers: Sit to/from Stand Sit to Stand: Min assist, +2  physical assistance           General transfer comment: min A +2 from bed, recliner, and toilet for balance during rise and transition of hands to RW    Ambulation/Gait Ambulation/Gait assistance: Min assist, +2 safety/equipment Gait Distance (Feet): 150 Feet Assistive device: Rolling walker (2 wheels) Gait Pattern/deviations: Step-to pattern (Hop to) Gait velocity: decreased Gait velocity interpretation: <1.31 ft/sec, indicative of household ambulator   General Gait Details: chair brought behind for safety but pt ambulated with min A of 1. Cues for self monitoring and knowing when to stop and take a break   Optometrist     Tilt Bed    Modified Rankin (Stroke Patients Only)       Balance Overall balance assessment: Needs assistance Sitting-balance support: Bilateral upper extremity supported Sitting balance-Leahy Scale: Good     Standing balance support: Bilateral upper extremity supported, During functional activity Standing balance-Leahy Scale: Poor Standing balance comment: reliant on RW                            Communication Communication Communication: No apparent difficulties  Cognition Arousal: Alert Behavior During Therapy: WFL for tasks assessed/performed   PT - Cognitive impairments: Awareness, Safety/Judgement                       PT - Cognition Comments: pt much calmer today, motivated to ambulate. Still relays concerns about leaving but encouraged him that he is mobilizing well Following commands: Intact      Cueing Cueing Techniques: Verbal cues  Exercises Amputee  Exercises Quad Sets:  (c/o thigh pain with these) Hip Extension: AROM, Right, 10 reps, Standing Hip ABduction/ADduction: AROM, Right, 10 reps, Seated Hip Flexion/Marching: AROM, Right, 10 reps, Standing    General Comments General comments (skin integrity, edema, etc.): VSS. Pt showing improved control of mvmt, esp with  backing up with RW      Pertinent Vitals/Pain Pain Assessment Pain Assessment: Faces Faces Pain Scale: Hurts little more Pain Location: R LE Pain Descriptors / Indicators: Grimacing, Guarding Pain Intervention(s): Limited activity within patient's tolerance, Monitored during session    Home Living                          Prior Function            PT Goals (current goals can now be found in the care plan section) Acute Rehab PT Goals Patient Stated Goal: to get back to walking PT Goal Formulation: With patient Time For Goal Achievement: 12/15/23 Potential to Achieve Goals: Good Progress towards PT goals: Progressing toward goals    Frequency    Min 3X/week      PT Plan      Co-evaluation              AM-PAC PT 6 Clicks Mobility   Outcome Measure  Help needed turning from your back to your side while in a flat bed without using bedrails?: A Little Help needed moving from lying on your back to sitting on the side of a flat bed without using bedrails?: A Little Help needed moving to and from a bed to a chair (including a wheelchair)?: A Little Help needed standing up from a chair using your arms (e.g., wheelchair or bedside chair)?: A Little Help needed to walk in hospital room?: A Little Help needed climbing 3-5 steps with a railing? : Total 6 Click Score: 16    End of Session Equipment Utilized During Treatment: Gait belt Activity Tolerance: Other (comment);Patient tolerated treatment well Patient left: with call bell/phone within reach;in chair;with chair alarm set Nurse Communication: Mobility status PT Visit Diagnosis: Other abnormalities of gait and mobility (R26.89)     Time: 8865-8844 PT Time Calculation (min) (ACUTE ONLY): 21 min  Charges:    $Gait Training: 8-22 mins PT General Charges $$ ACUTE PT VISIT: 1 Visit                     Richerd Lipoma, PT  Acute Rehab Services Secure chat preferred Office  [phone]    Richerd CROME Hobart Marte 12/06/2023, 1:11 PM

## 2023-12-06 NOTE — TOC Transition Note (Signed)
 Transition of Care [facility]) - Discharge Note   Patient Details  Name: Jonathan Ibarra MRN: [id number] Date of Birth: [birth year]  [facility]) CM/SW Contact:  Bridget Cordella Simmonds, LCSW Phone Number: 12/06/2023, 12:13 PM   Clinical Narrative:    PT discharging to [facility], RN call report to: [phone].  PTAR called [phone].  1000: CSW confirmed with Britt[facility] that they can receive pt today.    Final next level of care: Skilled Nursing Facility Barriers to Discharge: Barriers Resolved   Patient Goals and CMS Choice Patient states their goals for this hospitalization and ongoing recovery are:: walk again   Choice offered to / list presented to : Patient (pt wants to return to [facility])      Discharge Placement              Patient chooses bed at:  ([facility]) Patient to be transferred to facility by: ptar Name of family member notified: unable to leave message for friend [facility]    Discharge Plan and Services Additional resources added to the After Visit Summary for   In-house Referral: Clinical Social Work   Post Acute Care Choice: Skilled Nursing Facility                               Social Drivers of Health (SDOH) Interventions SDOH Screenings   Food Insecurity: No Food Insecurity (12/01/2023)  Recent Concern: Food Insecurity - Food Insecurity Present (10/07/2023)   Received from [facility]  Housing: High Risk (12/01/2023)  Transportation Needs: Unmet Transportation Needs (12/01/2023)  Utilities: Low Risk  (11/04/2023)   Received from [facility]  Recent Concern: Utilities - At Risk (10/07/2023)   Received from [facility]  Financial Resource Strain: High Risk (09/05/2023)   Received from [facility]  Social Connections: Unknown (07/05/2021)   Received from [facility]  Stress: No Stress Concern Present (10/07/2023)   Received from [facility]  Tobacco Use: High Risk (11/30/2023)      Readmission Risk Interventions     No data to display

## 2023-12-06 NOTE — Progress Notes (Signed)
 Patient discharging via ptar, report given to nurse Seena at [facility], iv access removed, AVS in drawer awaiting pickup.

## 2023-12-06 NOTE — Progress Notes (Signed)
 Patient has a wheelchair that ptar could not bring along greg social worker called the [facility] [place], they will come pick it up, currently in 5N conference room with labels on wheelchair and x2 leg rest.

## 2024-02-06 ENCOUNTER — Other Ambulatory Visit: Payer: Self-pay
